# Patient Record
Sex: Male | Born: 1960 | Race: White | Hispanic: No | Smoking: Former smoker
Health system: Southern US, Community
[De-identification: ages and names within clinical notes are randomized; demographics above are authoritative.]

## PROBLEM LIST (undated history)

## (undated) DIAGNOSIS — M2540 Effusion, unspecified joint: Secondary | ICD-10-CM

## (undated) DIAGNOSIS — M2550 Pain in unspecified joint: Secondary | ICD-10-CM

## (undated) DIAGNOSIS — Z87442 Personal history of urinary calculi: Secondary | ICD-10-CM

## (undated) DIAGNOSIS — J189 Pneumonia, unspecified organism: Secondary | ICD-10-CM

## (undated) DIAGNOSIS — IMO0002 Reserved for concepts with insufficient information to code with codable children: Secondary | ICD-10-CM

## (undated) DIAGNOSIS — G8929 Other chronic pain: Secondary | ICD-10-CM

## (undated) DIAGNOSIS — R200 Anesthesia of skin: Secondary | ICD-10-CM

## (undated) DIAGNOSIS — F419 Anxiety disorder, unspecified: Secondary | ICD-10-CM

## (undated) DIAGNOSIS — H9319 Tinnitus, unspecified ear: Secondary | ICD-10-CM

## (undated) DIAGNOSIS — M549 Dorsalgia, unspecified: Secondary | ICD-10-CM

## (undated) DIAGNOSIS — T8859XA Other complications of anesthesia, initial encounter: Secondary | ICD-10-CM

## (undated) DIAGNOSIS — T4145XA Adverse effect of unspecified anesthetic, initial encounter: Secondary | ICD-10-CM

## (undated) DIAGNOSIS — M62838 Other muscle spasm: Secondary | ICD-10-CM

## (undated) DIAGNOSIS — R202 Paresthesia of skin: Secondary | ICD-10-CM

## (undated) DIAGNOSIS — Z8739 Personal history of other diseases of the musculoskeletal system and connective tissue: Secondary | ICD-10-CM

## (undated) DIAGNOSIS — I10 Essential (primary) hypertension: Secondary | ICD-10-CM

## (undated) DIAGNOSIS — M1990 Unspecified osteoarthritis, unspecified site: Secondary | ICD-10-CM

## (undated) HISTORY — PX: OTHER SURGICAL HISTORY: SHX169

## (undated) HISTORY — PX: HERNIA REPAIR: SHX51

## (undated) HISTORY — PX: LITHOTRIPSY: SUR834

---

## 2010-01-31 ENCOUNTER — Telehealth: Payer: Self-pay | Admitting: Critical Care Medicine

## 2010-01-31 ENCOUNTER — Ambulatory Visit: Payer: Self-pay | Admitting: Critical Care Medicine

## 2010-01-31 DIAGNOSIS — I10 Essential (primary) hypertension: Secondary | ICD-10-CM | POA: Insufficient documentation

## 2010-01-31 DIAGNOSIS — J189 Pneumonia, unspecified organism: Secondary | ICD-10-CM | POA: Insufficient documentation

## 2010-01-31 DIAGNOSIS — R222 Localized swelling, mass and lump, trunk: Secondary | ICD-10-CM | POA: Insufficient documentation

## 2010-02-04 ENCOUNTER — Telehealth (INDEPENDENT_AMBULATORY_CARE_PROVIDER_SITE_OTHER): Payer: Self-pay | Admitting: *Deleted

## 2010-02-05 ENCOUNTER — Ambulatory Visit
Admission: RE | Admit: 2010-02-05 | Discharge: 2010-02-05 | Payer: Self-pay | Source: Home / Self Care | Admitting: Critical Care Medicine

## 2010-02-05 ENCOUNTER — Ambulatory Visit: Payer: Self-pay | Admitting: Critical Care Medicine

## 2010-02-06 ENCOUNTER — Telehealth: Payer: Self-pay | Admitting: Critical Care Medicine

## 2010-02-07 ENCOUNTER — Telehealth: Payer: Self-pay | Admitting: Critical Care Medicine

## 2010-02-07 ENCOUNTER — Encounter: Payer: Self-pay | Admitting: Critical Care Medicine

## 2010-02-14 ENCOUNTER — Ambulatory Visit (HOSPITAL_BASED_OUTPATIENT_CLINIC_OR_DEPARTMENT_OTHER): Admission: RE | Admit: 2010-02-14 | Discharge: 2010-02-14 | Payer: Self-pay | Admitting: Critical Care Medicine

## 2010-02-14 ENCOUNTER — Ambulatory Visit: Payer: Self-pay | Admitting: Diagnostic Radiology

## 2010-02-14 ENCOUNTER — Ambulatory Visit: Payer: Self-pay | Admitting: Critical Care Medicine

## 2010-08-29 NOTE — Miscellaneous (Signed)
Summary: Cleocin Rx  Clinical Lists Changes  Medications: Changed medication from CLEOCIN 300 MG CAPS (CLINDAMYCIN HCL) Take 1 tablet by mouth three times a day to CLEOCIN 300 MG CAPS (CLINDAMYCIN HCL) Take 1 tablet by mouth three times a day - Signed Rx of CLEOCIN 300 MG CAPS (CLINDAMYCIN HCL) Take 1 tablet by mouth three times a day;  #30 x 0;  Signed;  Entered by: Storm Frisk MD;  Authorized by: Storm Frisk MD;  Method used: Electronically to [facility]*, [address], [place], [place]  [address], Ph: [phone] or [phone], Fax: [phone]    Prescriptions: CLEOCIN 300 MG CAPS (CLINDAMYCIN HCL) Take 1 tablet by mouth three times a day  #30 x 0   Entered and Authorized by:   Storm Frisk MD   Signed by:   Storm Frisk MD on 02/07/2010   Method used:   Electronically to        [facility]* (retail)       [address] [place], [place]  [address]       Ph: [phone] or [phone]       Fax: [phone]   RxID:   [id number]

## 2010-08-29 NOTE — Letter (Signed)
Summary: [employer]  [address]   [place], [place] [address]   Phone: [phone]  Fax: [phone]    Today's Date: February 14, 2010  Name of Patient: Vernon Frye  The above named patient had a medical visit today at: 9 am   Please take this into consideration when reviewing the time away from work/school.    Special Instructions:  [ x ] None  [  ] To be off the remainder of today, returning to the normal work / school schedule tomorrow.  [  ] To be off until the next scheduled appointment on ______________________.  [  ] Other ________________________________________________________________ ________________________________________________________________________   Sincerely yours,   Shan Levans, MD  Appended Document: Work Excuse I agree  pw

## 2010-08-29 NOTE — Progress Notes (Signed)
Summary: rx  Phone Note Call from Patient Call back at Home Phone [phone]   Caller: Patient Call For: Vernon Frye Reason for Call: Talk to Nurse Summary of Call: PEW running scope next week, pt coming in afterward.  Also said he would call in antibiotic.  Pt needs to be able to pick up rx by 4:30.  He lives 30 miles from pharmacy and wants to pick up on his way home.  [facility]) Initial call taken by: Vernon Frye,  January 31, 2010 2:47 PM  Follow-up for Phone Call        Please advise what rx you want pt have have, thanks Vernon Frye  January 31, 2010 3:14 PM   Additional Follow-up for Phone Call Additional follow up Details #1::        call in cleocin  300mg  by mouth three times a day #30  Rx sent, pt informed. Vernon Frye CMA  February 01, 2010 9:20 AM  Additional Follow-up by: Storm Frisk MD,  January 31, 2010 9:13 PM    New/Updated Medications: CLEOCIN 300 MG CAPS (CLINDAMYCIN HCL) Take 1 tablet by mouth three times a day Prescriptions: CLEOCIN 300 MG CAPS (CLINDAMYCIN HCL) Take 1 tablet by mouth three times a day  #30 x 0   Entered by:   Vernon Frye CMA   Authorized by:   Storm Frisk MD   Signed by:   Vernon Frye CMA on 02/01/2010   Method used:   Electronically to        [facility]* (retail)       [address] [place], [place]  [address]       Ph: [phone] or [phone]       Fax: [phone]   RxID:   [id number]

## 2010-08-29 NOTE — Progress Notes (Signed)
Summary: Needs appt one week [place]  Phone Note Outgoing Call   Reason for Call: Confirm/change Appt Summary of Call: pls call this pt and get him an appt with CXR first next week in [place] Initial call taken by: Storm Frisk MD,  February 07, 2010 9:04 AM  Follow-up for Phone Call        Called, spoke with pt.  Pt informed he needs to come in next wk for cxr and to see PW.  OV scheduled for July 21 at 9:00 in [place], pt informed to arrive at 845am to check in, then go down for CXR and come back up for OV with PW.  Pt verbalized understanding.  Gweneth Dimitri RN  February 07, 2010 9:19 AM

## 2010-08-29 NOTE — Progress Notes (Signed)
Summary: nos appt  Phone Note Call from Patient   Caller: [email or web address]  Call For: Cherl Gorney Summary of Call: LMTCB x2 to rsc nos from 7/12. Initial call taken by: Darletta Moll,  February 06, 2010 3:50 PM

## 2010-08-29 NOTE — Assessment & Plan Note (Signed)
Summary: Pulmonary  OV   Copy to:  Dr. Candie Echevaria Primary Provider/Referring Provider:  Dr. Candie Echevaria  CC:  Follow up with CXR.  Pt states breathing and cough have improved.  States he still has a very slight cough-occ prod with a small amount of clear mucus.  Pt also states he is also having sinus drainage.[name] Kitchen  History of Present Illness: Pulmonary OV   This is a 50 year old male with necrotizing LUL lung abscess, poor dentition. This pt has been ill for one month.  He had what he thought was a  cold let it go and then worse. The  saw pcp moore who  dx Pna.  The pt was initially rxwith rocephin  injections, zpak.  Later Cefzil added.  CXR persisting LUL infiltrate.  The pt Had severe cough paroxysms.  The pt with hx poor dentition.  Pt smokes cigars.  There was blood streak mucus.    CT chest then done 01/25/10 and revealed cavitary LUL infilt/mass and two liver lesions ,hypodense. Pt saw GI MD who said see pulm first. The pt now:  still coughs and feels like sl amount of phlegm  There is now no chest pain,  no real fever.  notes night sweats,  No TB exposure.     February 14, 2010 9:21 AM Pt notes improvement, less cough, less congestion, No sinus pressure,  no f/c/s. No chest pain. No excess secretions.  Pt had FOB that revealed no endobronchial lesions.  No purulence.  Bx neg.  C/S data unrevealing.   CXR today improved.  Preventive Screening-Counseling & Management  Alcohol-Tobacco     Alcohol drinks/day: 2     Alcohol type: beer     Smoking Status: quit < 6 months     Year Quit: 2007     Cigars/week: 1 cigar daily for past year, now quit 12/2009  Current Medications (verified): 1)  Toprol Xl 50 Mg Xr24h-Tab (Metoprolol Succinate) .... Take 1 Tablet By Mouth Once A Day 2)  Lisinopril 30 Mg Tabs (Lisinopril) .... Take 1 Tablet By Mouth Once A Day 3)  Xanax 0.5 Mg Tabs (Alprazolam) .... One To Two Times Daily 4)  Cleocin 300 Mg Caps (Clindamycin Hcl) .... Take 1 Tablet By Mouth Three  Times A Day  Allergies (verified): No Known Drug Allergies  Past History:  Past medical, surgical, family and social histories (including risk factors) reviewed, and no changes noted (except as noted below).  Past Medical History: Reviewed history from 01/31/2010 and no changes required. HYPERTENSION (ICD-401.9)  Past Surgical History: Reviewed history from 01/31/2010 and no changes required. kidney stones removed 2002  lithotripsy  Family History: Reviewed history from 01/31/2010 and no changes required. mother-hx of asthma  Social History: Reviewed history from 01/31/2010 and no changes required. Patient states former smoker.  Quit smoking cig in 2007: < 1ppd x 12 years.  Quit smoking cigars in June 2011: 1 cigar/day x 1 yr. 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  The patient denies shortness of breath with activity, shortness of breath at rest, productive cough, coughing up blood, chest pain, irregular heartbeats, acid heartburn, indigestion, loss of appetite, weight change, abdominal pain, difficulty swallowing, sore throat, tooth/dental problems, headaches, nasal congestion/difficulty breathing through nose, sneezing, itching, ear ache, anxiety, depression, hand/feet swelling, joint stiffness or pain, rash, change in color of mucus,  and fever.    Vital Signs:  Patient profile:   50 year old male Height:      75 inches Weight:      241 pounds BMI:     30.23 O2 Sat:      97 % on Room air Temp:     98.5 degrees F oral Pulse rate:   88 / minute BP sitting:   132 / 92  (left arm) Cuff size:   regular  Vitals Entered By: Gweneth Dimitri RN (February 14, 2010 9:17 AM)  O2 Flow:  Room air CC: Follow up with CXR.  Pt states breathing and cough have improved.  States he still has a very slight cough-occ prod with a small amount of clear mucus.  Pt also states  he is also having sinus drainage. Comments Medications reviewed with patient Daytime contact number verified with patient. Gweneth Dimitri RN  February 14, 2010 9:18 AM    Physical Exam  Additional Exam:  Gen: Pleasant, well-nourished, in no distress,  normal affect ENT: No lesions,  mouth clear,  oropharynx clear, no postnasal drip, moderate periodontal disease Neck: No JVD, no TMG, no carotid bruits Lungs: No use of accessory muscles, no dullness to percussion, clear without rales or rhonchi Cardiovascular: RRR, heart sounds normal, no murmur or gallops, no peripheral edema Abdomen: soft and NT, no HSM,  BS normal Musculoskeletal: No deformities, no cyanosis or clubbing Neuro: alert, non focal Skin: Warm, no lesions or rashes    CXR  Procedure date:  02/14/2010  Findings:      IMPRESSION: No pulmonary edema.  No pleural effusion.  No pneumothorax.  Stable infiltrative process in left upper lobe.  Impression & Recommendations:  Problem # 1:  PNEUMONIA (ICD-486) Assessment Improved LUL necrotizing pneumonia wiht cavity formation. All c/s neg. Pt improved on cleocin.  Poor dentition noted with periodontal dz plan f/u in 6weeks with cxr complete 20days of 300mg  three times a day cleocin d/c cigars liver lesions suspect are cysts.  doubt need further eval minimize alcohol use  His updated medication list for this problem includes:    Cleocin 300 Mg Caps (Clindamycin hcl) .[name] Kitchen... Take 1 tablet by mouth three times a day  Orders: Est. Patient Level III ([address])  Complete Medication List: 1)  Toprol Xl 50 Mg Xr24h-tab (Metoprolol succinate) .... Take 1 tablet by mouth once a day 2)  Lisinopril 30 Mg Tabs (Lisinopril) .... Take 1 tablet by mouth once a day 3)  Xanax 0.5 Mg Tabs (Alprazolam) .... One to two times daily 4)  Cleocin 300 Mg Caps (Clindamycin hcl) .... Take 1 tablet by mouth three times a day  Patient Instructions: 1)  No change in medications 2)  Return in       4    months  with Chest Xray  Appended Document: Pulmonary  OV fax Candie Echevaria

## 2010-08-29 NOTE — Assessment & Plan Note (Signed)
Summary: Pulmonary Consultation   Copy to:  Dr. Candie Echevaria Primary Provider/Referring Provider:  Dr. Candie Echevaria  CC:  Pulmonary Consult for pulmonary nodule and recent pna..  History of Present Illness: Pulmonary Consultation   This is a 50 year old male who presents with an abnormal radiology finding.  The patient complains of shortness of breath, chest tightness, chest pain worse with breathing and coughing, wheezing, cough, mucous production, nocturnal awakening, exercise induced symptoms, and congestion, but denies history of diagnosed COPD.  The abnormality is described as pulmonary infiltrate and mass in LUL with cavitation.  The mass is described as left upper lobe.  There is no assoc lymphadenopathy.  This pt has been ill for one month.  He had what he thought was a  cold let it go and then worse. The  saw pcp moore who  dx Pna.  The pt was initially rxwith rocephin  injections, zpak.  Later Cefzil added.  CXR persisting LUL infiltrate.  The pt Had severe cough paroxysms.  The pt with hx poor dentition.  Pt smokes cigars.  There was blood streak mucus.    CT chest then done 01/25/10 and revealed cavitary LUL infilt/mass and two liver lesions ,hypodense. Pt saw GI MD who said see pulm first. The pt now:  still coughs and feels like sl amount of phlegm  There is now no chest pain,  no real fever.  notes night sweats,  No TB exposure.       Preventive Screening-Counseling & Management  Alcohol-Tobacco     Alcohol drinks/day: 2     Alcohol type: beer     Smoking Status: quit < 6 months     Year Quit: 2007     Cigars/week: 1 cigar daily for past year, now quit 12/2009  Current Medications (verified): 1)  Toprol Xl 50 Mg Xr24h-Tab (Metoprolol Succinate) .... Take 1 Tablet By Mouth Once A Day 2)  Lisinopril 30 Mg Tabs (Lisinopril) .... Take 1 Tablet By Mouth Once A Day 3)  Xanax 0.5 Mg Tabs (Alprazolam) .... One To Two Times Daily  Allergies (verified): No Known Drug  Allergies  Past History:  Past medical, surgical, family and social histories (including risk factors) reviewed, and no changes noted (except as noted below).  Past Medical History: HYPERTENSION (ICD-401.9)  Past Surgical History: kidney stones removed 2002  lithotripsy  Family History: Reviewed history and no changes required. mother-hx of asthma  Social History: Reviewed history and no changes required. Patient states former smoker.  Quit smoking cig in 2007: < 1ppd x 12 years.  Quit smoking cigars in June 2011: 1 cigar/day x 1 yr. 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  The patient denies shortness of breath with activity, shortness of breath at rest, non-productive cough, chest pain, irregular heartbeats, acid heartburn, abdominal pain, difficulty swallowing, sore throat, tooth/dental problems, headaches, nasal congestion/difficulty breathing through nose, sneezing, itching, ear ache, hand/feet swelling, joint stiffness or pain, and rash.        See HPI for Pulmonary, Cardiac, General, and ENT review of systems.  Vital Signs:  Patient profile:   49 year old male Height:  75 inches Weight:      238 pounds BMI:     29.86 O2 Sat:      96 % on Room air Temp:     98.0 degrees F oral Pulse rate:   97 / minute BP sitting:   114 / 86  (left arm) Cuff size:   large  Vitals Entered By: Gweneth Dimitri RN (January 31, 2010 9:03 AM)  O2 Flow:  Room air CC: Pulmonary Consult for pulmonary nodule and recent pna. Comments Medications reviewed with patient Daytime contact number verified with patient. Gweneth Dimitri RN  January 31, 2010 9:03 AM    Physical  Exam  Additional Exam:  Gen: Pleasant, well-nourished, in no distress,  normal affect ENT: No lesions,  mouth clear,  oropharynx clear, no postnasal drip, moderate periodontal disease Neck: No JVD, no TMG, no carotid bruits Lungs: No use of accessory muscles, no dullness to percussion, clear without rales or rhonchi Cardiovascular: RRR, heart sounds normal, no murmur or gallops, no peripheral edema Abdomen: soft and NT, no HSM,  BS normal Musculoskeletal: No deformities, no cyanosis or clubbing Neuro: alert, non focal Skin: Warm, no lesions or rashes    CT of Chest  Procedure date:  01/25/2010  Findings:      cavitary left upper lobe mass/infection, 2 liver lesions, not a cyst,  1 cm pleural based RLL nodule  Impression & Recommendations:  Problem # 1:  SWELLING, MASS, OR LUMP IN CHEST (ICD-786.6) Assessment Deteriorated LUL infiltrate/mass with cavitation, no hilar or mediastinal LAN ? abscess ?CA  poor dentition makes one suspect aspiration.  Moderate ETOH use also a risk factor  plan FOB scheduled eventually needs a PET scan start cleocin 300mg  three times a day   Orders: New Patient Level V ([address])  Medications Added to Medication List This Visit: 1)  Toprol Xl 50 Mg Xr24h-tab (Metoprolol succinate) .... Take 1 tablet by mouth once a day 2)  Lisinopril 30 Mg Tabs (Lisinopril) .... Take 1 tablet by mouth once a day 3)  Xanax 0.5 Mg Tabs (Alprazolam) .... One to two times daily  Complete Medication List: 1)  Toprol Xl 50 Mg Xr24h-tab (Metoprolol succinate) .... Take 1 tablet by mouth once a day 2)  Lisinopril 30 Mg Tabs (Lisinopril) .... Take 1 tablet by mouth once a day 3)  Xanax 0.5 Mg Tabs (Alprazolam) .... One to two times daily  Patient Instructions: 1)  A Bronchoscopy will be scheduled for next week, call me for day you wish this scheduled. 2)  I will obtain and review your CT scan  Appended Document: Pulmonary Consultation fax Candie Echevaria

## 2010-08-29 NOTE — Progress Notes (Signed)
Summary: Bronch @ [facility] on Tues., 02/05/2010 @ 1pm  Phone Note Outgoing Call Call back at Home Phone [phone]   Reason for Call: Confirm/change Appt Summary of Call: Pls contact the pt to determine a date/time for the bronchoscopy to be scheduled this week at [facility]  Best times for me are :  7/12 or 7/13 at 1pm,    7/14 at 130pm  or 730am  I need fluoro.  Possible TB risk.  PW Initial call taken by: Storm Frisk MD,  February 04, 2010 11:24 AM  Follow-up for Phone Call        pt called, thought bronch would be done here in office.  Nothing has been set up, Lawson Fiscal advised and taking care of.  Pt aware Lawson Fiscal will contact him re: appt.  [phone] Follow-up by: Eugene Gavia,  February 04, 2010 11:46 AM  Additional Follow-up for Phone Call Additional follow up Details #1::        The bronchoscopy is sch for Tues., 02/05/2010 @ 1pm. Patient knows to arrive at 11:45am in admitting at [facility]. NPO after midnight and someone will need to be with him to drive him home. The patient had verbal understanding of all instructions. I will forward msg to Dr. Delford Field so he is aware.  Bronch @ [facility] @ 1pm on 02/05/2010. Additional Follow-up by: Michel Bickers CMA,  February 04, 2010 11:52 AM    Additional Follow-up for Phone Call Additional follow up Details #2::    noted and thanks Follow-up by: Storm Frisk MD,  February 04, 2010 12:05 PM

## 2010-10-13 LAB — LEGIONELLA PROFILE(CULTURE+DFA/SMEAR): Legionella Antigen (DFA): NEGATIVE

## 2010-10-13 LAB — FUNGUS CULTURE W SMEAR: Fungal Smear: NONE SEEN

## 2010-10-13 LAB — CULTURE, RESPIRATORY W GRAM STAIN: Culture: NO GROWTH

## 2010-10-13 LAB — PNEUMOCYSTIS JIROVECI SMEAR BY DFA: Pneumocystis jiroveci Ag: NOT DETECTED

## 2010-10-13 LAB — AFB CULTURE WITH SMEAR (NOT AT ARMC): Acid Fast Smear: NONE SEEN

## 2014-01-10 NOTE — H&P (Signed)
  Vernon Frye is an 53 y.o. male.    Chief Complaint: left shoulder pain   HPI: Pt is a 53 y.o. male complaining of left shoulder pain for multiple months. Pain had continually increased since the beginning. X-rays in the clinic rotator cuff tear in left shoulder. Pt has tried various conservative treatments which have failed to alleviate their symptoms, including exercises and injections. Various options are discussed with the patient. Risks, benefits and expectations were discussed with the patient. Patient understand the risks, benefits and expectations and wishes to proceed with surgery.   PCP:  No primary provider on file.  D/C Plans:  Home   PMH: No past medical history on file.  PSH: No past surgical history on file.  Social History:  has no tobacco, alcohol, and drug history on file.  Allergies:  Allergies not on file  Medications: No current facility-administered medications for this encounter.   No current outpatient prescriptions on file.    No results found for this or any previous visit (from the past 48 hour(s)). No results found.  ROS: Pain with rom of the left upper extremity  Physical Exam:  Alert and oriented 53 y.o. male in no acute distress Cranial nerves 2-12 intact Cervical spine: full rom with no tenderness, nv intact distally Chest: active breath sounds bilaterally, no wheeze rhonchi or rales Heart: regular rate and rhythm, no murmur Abd: non tender non distended with active bowel sounds Hip is stable with rom  Left shoulder with moderate weakness with external rotation Guarded rom due to pain nv intact distally No rashes or edema  Assessment/Plan Assessment: left shoulder rotator cuff tear  Plan: Patient will undergo a left shoulder rotator cuff repair by Dr. Veverly Fells at [facility]. Risks benefits and expectations were discussed with the patient. Patient understand risks, benefits and expectations and wishes to proceed.

## 2014-01-12 ENCOUNTER — Encounter (HOSPITAL_COMMUNITY): Payer: Self-pay | Admitting: Pharmacy Technician

## 2014-01-13 NOTE — Progress Notes (Signed)
Several unsuccessful attempts were made to contact pt; lvm on #[id number] with pre-op instructions according to checklist.

## 2014-01-15 MED ORDER — CEFAZOLIN SODIUM-DEXTROSE 2-3 GM-% IV SOLR
2.0000 g | INTRAVENOUS | Status: AC
  Administered 2014-01-16: 2 g via INTRAVENOUS
  Filled 2014-01-15: qty 50

## 2014-01-16 ENCOUNTER — Observation Stay (HOSPITAL_COMMUNITY)
Admission: RE | Admit: 2014-01-16 | Discharge: 2014-01-17 | Disposition: A | Discharge status: Home / Self Care | Payer: Worker's Compensation | Source: Ambulatory Visit | Attending: Orthopedic Surgery | Admitting: Orthopedic Surgery

## 2014-01-16 ENCOUNTER — Encounter (HOSPITAL_COMMUNITY): Payer: Self-pay | Admitting: *Deleted

## 2014-01-16 ENCOUNTER — Encounter (HOSPITAL_COMMUNITY): Payer: Worker's Compensation | Admitting: Certified Registered Nurse Anesthetist

## 2014-01-16 ENCOUNTER — Ambulatory Visit (HOSPITAL_COMMUNITY): Payer: Worker's Compensation

## 2014-01-16 ENCOUNTER — Ambulatory Visit (HOSPITAL_COMMUNITY): Payer: Worker's Compensation | Admitting: Certified Registered Nurse Anesthetist

## 2014-01-16 ENCOUNTER — Encounter (HOSPITAL_COMMUNITY)
Admission: RE | Disposition: A | Discharge status: Home / Self Care | Payer: Worker's Compensation | Source: Ambulatory Visit | Attending: Orthopedic Surgery

## 2014-01-16 DIAGNOSIS — X58XXXA Exposure to other specified factors, initial encounter: Secondary | ICD-10-CM | POA: Insufficient documentation

## 2014-01-16 DIAGNOSIS — M19019 Primary osteoarthritis, unspecified shoulder: Secondary | ICD-10-CM | POA: Insufficient documentation

## 2014-01-16 DIAGNOSIS — M9420 Chondromalacia, unspecified site: Secondary | ICD-10-CM | POA: Insufficient documentation

## 2014-01-16 DIAGNOSIS — Z5333 Arthroscopic surgical procedure converted to open procedure: Secondary | ICD-10-CM | POA: Insufficient documentation

## 2014-01-16 DIAGNOSIS — I10 Essential (primary) hypertension: Secondary | ICD-10-CM | POA: Insufficient documentation

## 2014-01-16 DIAGNOSIS — S43429A Sprain of unspecified rotator cuff capsule, initial encounter: Principal | ICD-10-CM | POA: Insufficient documentation

## 2014-01-16 DIAGNOSIS — M75100 Unspecified rotator cuff tear or rupture of unspecified shoulder, not specified as traumatic: Secondary | ICD-10-CM | POA: Diagnosis present

## 2014-01-16 DIAGNOSIS — S43439A Superior glenoid labrum lesion of unspecified shoulder, initial encounter: Secondary | ICD-10-CM | POA: Insufficient documentation

## 2014-01-16 DIAGNOSIS — Z87891 Personal history of nicotine dependence: Secondary | ICD-10-CM | POA: Insufficient documentation

## 2014-01-16 HISTORY — DX: Personal history of urinary calculi: Z87.442

## 2014-01-16 HISTORY — DX: Effusion, unspecified joint: M25.40

## 2014-01-16 HISTORY — DX: Dorsalgia, unspecified: M54.9

## 2014-01-16 HISTORY — DX: Essential (primary) hypertension: I10

## 2014-01-16 HISTORY — DX: Unspecified osteoarthritis, unspecified site: M19.90

## 2014-01-16 HISTORY — DX: Other muscle spasm: M62.838

## 2014-01-16 HISTORY — DX: Pain in unspecified joint: M25.50

## 2014-01-16 HISTORY — DX: Anxiety disorder, unspecified: F41.9

## 2014-01-16 HISTORY — DX: Other chronic pain: G89.29

## 2014-01-16 HISTORY — PX: SHOULDER ARTHROSCOPY WITH SUBACROMIAL DECOMPRESSION AND OPEN ROTATOR C: SHX5688

## 2014-01-16 LAB — CBC
HCT: 42.1 % (ref 39.0–52.0)
HEMOGLOBIN: 14.6 g/dL (ref 13.0–17.0)
MCH: 31.7 pg (ref 26.0–34.0)
MCHC: 34.7 g/dL (ref 30.0–36.0)
MCV: 91.3 fL (ref 78.0–100.0)
Platelets: 274 10*3/uL (ref 150–400)
RBC: 4.61 MIL/uL (ref 4.22–5.81)
RDW: 12.5 % (ref 11.5–15.5)
WBC: 7.9 10*3/uL (ref 4.0–10.5)

## 2014-01-16 LAB — BASIC METABOLIC PANEL
BUN: 16 mg/dL (ref 6–23)
CALCIUM: 9.5 mg/dL (ref 8.4–10.5)
CO2: 26 mEq/L (ref 19–32)
Chloride: 101 mEq/L (ref 96–112)
Creatinine, Ser: 0.87 mg/dL (ref 0.50–1.35)
GFR calc non Af Amer: >90 mL/min (ref >90)
GLUCOSE: 119 mg/dL — AB (ref 70–99)
POTASSIUM: 4.2 meq/L (ref 3.7–5.3)
SODIUM: 139 meq/L (ref 137–147)

## 2014-01-16 SURGERY — SHOULDER ARTHROSCOPY WITH SUBACROMIAL DECOMPRESSION AND OPEN ROTATOR CUFF REPAIR, OPEN BICEPS TENDON REPAIR
Anesthesia: Regional | Site: Shoulder | Laterality: Left

## 2014-01-16 MED ORDER — BUPIVACAINE-EPINEPHRINE (PF) 0.25% -1:200000 IJ SOLN
INTRAMUSCULAR | Status: AC
  Filled 2014-01-16: qty 30

## 2014-01-16 MED ORDER — OXYCODONE-ACETAMINOPHEN 5-325 MG PO TABS
1.0000 | ORAL_TABLET | Freq: Three times a day (TID) | ORAL | Status: DC | PRN
  Administered 2014-01-17: 1 via ORAL
  Filled 2014-01-16: qty 1

## 2014-01-16 MED ORDER — MIDAZOLAM HCL 2 MG/2ML IJ SOLN
INTRAMUSCULAR | Status: AC
  Filled 2014-01-16: qty 2

## 2014-01-16 MED ORDER — FENTANYL CITRATE 0.05 MG/ML IJ SOLN
INTRAMUSCULAR | Status: AC
  Filled 2014-01-16: qty 5

## 2014-01-16 MED ORDER — PHENYLEPHRINE 40 MCG/ML (10ML) SYRINGE FOR IV PUSH (FOR BLOOD PRESSURE SUPPORT)
PREFILLED_SYRINGE | INTRAVENOUS | Status: AC
  Filled 2014-01-16: qty 10

## 2014-01-16 MED ORDER — PHENOL 1.4 % MT LIQD: 1.0000 | OROMUCOSAL | Status: DC | PRN

## 2014-01-16 MED ORDER — LISINOPRIL 10 MG PO TABS
10.0000 mg | ORAL_TABLET | Freq: Every day | ORAL | Status: DC
  Administered 2014-01-17: 10 mg via ORAL
  Filled 2014-01-16: qty 1

## 2014-01-16 MED ORDER — CHLORHEXIDINE GLUCONATE 4 % EX LIQD
60.0000 mL | Freq: Once | CUTANEOUS | Status: DC
Start: 2014-01-16 — End: 2014-01-16
  Filled 2014-01-16: qty 60

## 2014-01-16 MED ORDER — METAXALONE 800 MG PO TABS: 800.0000 mg | ORAL_TABLET | Freq: Four times a day (QID) | ORAL | Status: DC | PRN

## 2014-01-16 MED ORDER — ONDANSETRON HCL 4 MG PO TABS: 4.0000 mg | ORAL_TABLET | Freq: Four times a day (QID) | ORAL | Status: DC | PRN

## 2014-01-16 MED ORDER — OXYCODONE-ACETAMINOPHEN 10-325 MG PO TABS: 1.0000 | ORAL_TABLET | Freq: Three times a day (TID) | ORAL | Status: DC | PRN

## 2014-01-16 MED ORDER — KETOROLAC TROMETHAMINE 30 MG/ML IJ SOLN
INTRAMUSCULAR | Status: AC
  Filled 2014-01-16: qty 1

## 2014-01-16 MED ORDER — STERILE WATER FOR IRRIGATION IR SOLN
Status: DC | PRN
  Administered 2014-01-16: 30 mL

## 2014-01-16 MED ORDER — NAPROXEN 500 MG PO TABS
500.0000 mg | ORAL_TABLET | Freq: Two times a day (BID) | ORAL | Status: DC | PRN
  Filled 2014-01-16: qty 1

## 2014-01-16 MED ORDER — HYDROMORPHONE HCL PF 1 MG/ML IJ SOLN
1.0000 mg | INTRAMUSCULAR | Status: DC | PRN
  Administered 2014-01-16: 2 mg via INTRAVENOUS
  Administered 2014-01-16: 1 mg via INTRAVENOUS
  Administered 2014-01-16 – 2014-01-17 (×4): 2 mg via INTRAVENOUS
  Filled 2014-01-16: qty 1
  Filled 2014-01-16 (×5): qty 2

## 2014-01-16 MED ORDER — OXYCODONE HCL 5 MG PO TABS
5.0000 mg | ORAL_TABLET | Freq: Three times a day (TID) | ORAL | Status: DC | PRN
  Administered 2014-01-17: 5 mg via ORAL
  Filled 2014-01-16: qty 1

## 2014-01-16 MED ORDER — LACTATED RINGERS IV SOLN
INTRAVENOUS | Status: DC | PRN
  Administered 2014-01-16 (×2): via INTRAVENOUS

## 2014-01-16 MED ORDER — ARTIFICIAL TEARS OP OINT
TOPICAL_OINTMENT | OPHTHALMIC | Status: DC | PRN
  Administered 2014-01-16: 1 via OPHTHALMIC

## 2014-01-16 MED ORDER — HYDROMORPHONE HCL 2 MG PO TABS
2.0000 mg | ORAL_TABLET | ORAL | Status: DC | PRN
  Administered 2014-01-16: 4 mg via ORAL
  Administered 2014-01-16: 2 mg via ORAL
  Administered 2014-01-17 (×3): 4 mg via ORAL
  Filled 2014-01-16 (×2): qty 2
  Filled 2014-01-16: qty 1
  Filled 2014-01-16 (×2): qty 2

## 2014-01-16 MED ORDER — HYDROMORPHONE HCL 2 MG PO TABS: 2.0000 mg | ORAL_TABLET | ORAL | Status: DC | PRN

## 2014-01-16 MED ORDER — AMLODIPINE BESYLATE 5 MG PO TABS
5.0000 mg | ORAL_TABLET | Freq: Every day | ORAL | Status: DC
  Administered 2014-01-17: 5 mg via ORAL
  Filled 2014-01-16: qty 1

## 2014-01-16 MED ORDER — ONDANSETRON HCL 4 MG/2ML IJ SOLN: 4.0000 mg | Freq: Once | INTRAMUSCULAR | Status: DC | PRN

## 2014-01-16 MED ORDER — LIDOCAINE HCL (CARDIAC) 20 MG/ML IV SOLN
INTRAVENOUS | Status: AC
  Filled 2014-01-16: qty 5

## 2014-01-16 MED ORDER — LISINOPRIL-HYDROCHLOROTHIAZIDE 20-12.5 MG PO TABS: 1.0000 | ORAL_TABLET | Freq: Two times a day (BID) | ORAL | Status: DC

## 2014-01-16 MED ORDER — FENTANYL CITRATE 0.05 MG/ML IJ SOLN
INTRAMUSCULAR | Status: AC
  Administered 2014-01-16: 100 ug
  Filled 2014-01-16: qty 2

## 2014-01-16 MED ORDER — BUPIVACAINE-EPINEPHRINE 0.25% -1:200000 IJ SOLN
INTRAMUSCULAR | Status: DC | PRN
  Administered 2014-01-16: 30 mL

## 2014-01-16 MED ORDER — GLYCOPYRROLATE 0.2 MG/ML IJ SOLN
INTRAMUSCULAR | Status: AC
  Filled 2014-01-16: qty 6

## 2014-01-16 MED ORDER — PHENYLEPHRINE HCL 10 MG/ML IJ SOLN
INTRAMUSCULAR | Status: DC | PRN
  Administered 2014-01-16: 80 ug via INTRAVENOUS

## 2014-01-16 MED ORDER — NEOSTIGMINE METHYLSULFATE 10 MG/10ML IV SOLN
INTRAVENOUS | Status: DC | PRN
  Administered 2014-01-16: 4 mg via INTRAVENOUS

## 2014-01-16 MED ORDER — METOCLOPRAMIDE HCL 5 MG/ML IJ SOLN: 5.0000 mg | Freq: Three times a day (TID) | INTRAMUSCULAR | Status: DC | PRN

## 2014-01-16 MED ORDER — LIDOCAINE HCL (CARDIAC) 20 MG/ML IV SOLN
INTRAVENOUS | Status: DC | PRN
  Administered 2014-01-16: 40 mg via INTRAVENOUS

## 2014-01-16 MED ORDER — ONDANSETRON HCL 4 MG/2ML IJ SOLN: 4.0000 mg | Freq: Four times a day (QID) | INTRAMUSCULAR | Status: DC | PRN

## 2014-01-16 MED ORDER — PROPOFOL 10 MG/ML IV BOLUS
INTRAVENOUS | Status: DC | PRN
  Administered 2014-01-16: 180 mg via INTRAVENOUS

## 2014-01-16 MED ORDER — LACTATED RINGERS IV SOLN
INTRAVENOUS | Status: DC
  Administered 2014-01-16: 09:00:00 via INTRAVENOUS

## 2014-01-16 MED ORDER — ALPRAZOLAM 0.5 MG PO TABS
1.0000 mg | ORAL_TABLET | Freq: Three times a day (TID) | ORAL | Status: DC | PRN
  Administered 2014-01-17 (×2): 1 mg via ORAL
  Filled 2014-01-16 (×2): qty 2

## 2014-01-16 MED ORDER — MIDAZOLAM HCL 2 MG/2ML IJ SOLN
INTRAMUSCULAR | Status: AC
  Administered 2014-01-16: 2 mg
  Filled 2014-01-16: qty 2

## 2014-01-16 MED ORDER — KETOROLAC TROMETHAMINE 30 MG/ML IJ SOLN
INTRAMUSCULAR | Status: DC | PRN
  Administered 2014-01-16: 30 mg via INTRAVENOUS

## 2014-01-16 MED ORDER — ROCURONIUM BROMIDE 50 MG/5ML IV SOLN
INTRAVENOUS | Status: AC
  Filled 2014-01-16: qty 1

## 2014-01-16 MED ORDER — GLYCOPYRROLATE 0.2 MG/ML IJ SOLN
INTRAMUSCULAR | Status: DC | PRN
  Administered 2014-01-16: .8 mg via INTRAVENOUS
  Administered 2014-01-16: 0.2 mg via INTRAVENOUS

## 2014-01-16 MED ORDER — METOPROLOL SUCCINATE ER 50 MG PO TB24
50.0000 mg | ORAL_TABLET | Freq: Every day | ORAL | Status: DC
  Administered 2014-01-17: 50 mg via ORAL
  Filled 2014-01-16: qty 1

## 2014-01-16 MED ORDER — MENTHOL 3 MG MT LOZG: 1.0000 | LOZENGE | OROMUCOSAL | Status: DC | PRN

## 2014-01-16 MED ORDER — EPHEDRINE SULFATE 50 MG/ML IJ SOLN
INTRAMUSCULAR | Status: DC | PRN
  Administered 2014-01-16 (×5): 10 mg via INTRAVENOUS

## 2014-01-16 MED ORDER — METOCLOPRAMIDE HCL 5 MG PO TABS
5.0000 mg | ORAL_TABLET | Freq: Three times a day (TID) | ORAL | Status: DC | PRN
  Filled 2014-01-16: qty 2

## 2014-01-16 MED ORDER — ONDANSETRON HCL 4 MG/2ML IJ SOLN
INTRAMUSCULAR | Status: DC | PRN
  Administered 2014-01-16: 4 mg via INTRAVENOUS

## 2014-01-16 MED ORDER — SODIUM CHLORIDE 0.9 % IR SOLN
Status: DC | PRN
  Administered 2014-01-16: 1

## 2014-01-16 MED ORDER — DEXAMETHASONE SODIUM PHOSPHATE 10 MG/ML IJ SOLN
INTRAMUSCULAR | Status: DC | PRN
  Administered 2014-01-16: 4 mg via INTRAVENOUS

## 2014-01-16 MED ORDER — FENTANYL CITRATE 0.05 MG/ML IJ SOLN
INTRAMUSCULAR | Status: DC | PRN
  Administered 2014-01-16: 150 ug via INTRAVENOUS

## 2014-01-16 MED ORDER — ACETAMINOPHEN 325 MG PO TABS: 650.0000 mg | ORAL_TABLET | Freq: Four times a day (QID) | ORAL | Status: DC | PRN

## 2014-01-16 MED ORDER — DEXTROSE 5 % IV SOLN
10.0000 mg | INTRAVENOUS | Status: DC | PRN
  Administered 2014-01-16: 40 ug/min via INTRAVENOUS

## 2014-01-16 MED ORDER — CARISOPRODOL 350 MG PO TABS
350.0000 mg | ORAL_TABLET | Freq: Three times a day (TID) | ORAL | Status: DC | PRN
  Administered 2014-01-16: 350 mg via ORAL
  Filled 2014-01-16: qty 1

## 2014-01-16 MED ORDER — ACETAMINOPHEN 650 MG RE SUPP: 650.0000 mg | Freq: Four times a day (QID) | RECTAL | Status: DC | PRN

## 2014-01-16 MED ORDER — HYDROCHLOROTHIAZIDE 12.5 MG PO CAPS
12.5000 mg | ORAL_CAPSULE | Freq: Every day | ORAL | Status: DC
  Administered 2014-01-17: 12.5 mg via ORAL
  Filled 2014-01-16: qty 1

## 2014-01-16 MED ORDER — HYDROMORPHONE HCL PF 1 MG/ML IJ SOLN
0.2500 mg | INTRAMUSCULAR | Status: DC | PRN
Start: 2014-01-16 — End: 2014-01-16

## 2014-01-16 MED ORDER — ONDANSETRON HCL 4 MG/2ML IJ SOLN
INTRAMUSCULAR | Status: AC
  Filled 2014-01-16: qty 2

## 2014-01-16 MED ORDER — ROCURONIUM BROMIDE 100 MG/10ML IV SOLN
INTRAVENOUS | Status: DC | PRN
  Administered 2014-01-16: 50 mg via INTRAVENOUS

## 2014-01-16 MED ORDER — SODIUM CHLORIDE 0.9 % IR SOLN
Status: DC | PRN
  Administered 2014-01-16 (×3): 3000 mL

## 2014-01-16 SURGICAL SUPPLY — 71 items
ANCH SUT 2 1.3X1 LD 1 STRN (Anchor) ×1 IMPLANT
ANCHOR ALL-SUT FLEX 1.3 Y-KNOT (Anchor) ×2 IMPLANT
BIT DRILL 1.3M DISPOSABLE (BIT) ×2 IMPLANT
BLADE LONG MED 31MMX9MM (MISCELLANEOUS) ×1
BLADE LONG MED 31X9 (MISCELLANEOUS) ×1 IMPLANT
BLADE SURG 11 STRL SS (BLADE) ×3 IMPLANT
BLADE SURG 15 STRL LF DISP TIS (BLADE) IMPLANT
BLADE SURG 15 STRL SS (BLADE) ×6
BUR OVAL 4.0 (BURR) ×2 IMPLANT
CLOSURE WOUND 1/2 X4 (GAUZE/BANDAGES/DRESSINGS) ×2
COVER SURGICAL LIGHT HANDLE (MISCELLANEOUS) ×3 IMPLANT
DRAPE INCISE IOBAN 66X45 STRL (DRAPES) ×3 IMPLANT
DRAPE STERI 35X30 U-POUCH (DRAPES) ×3 IMPLANT
DRAPE U-SHAPE 47X51 STRL (DRAPES) ×3 IMPLANT
DRSG EMULSION OIL 3X3 NADH (GAUZE/BANDAGES/DRESSINGS) ×4 IMPLANT
DRSG PAD ABDOMINAL 8X10 ST (GAUZE/BANDAGES/DRESSINGS) ×2 IMPLANT
DURAPREP 26ML APPLICATOR (WOUND CARE) ×3 IMPLANT
ELECT NDL TIP 2.8 STRL (NEEDLE) IMPLANT
ELECT NEEDLE TIP 2.8 STRL (NEEDLE) ×3 IMPLANT
ELECT REM PT RETURN 9FT ADLT (ELECTROSURGICAL)
ELECTRODE REM PT RTRN 9FT ADLT (ELECTROSURGICAL) IMPLANT
GLOVE BIOGEL PI IND STRL 8 (GLOVE) IMPLANT
GLOVE BIOGEL PI INDICATOR 8 (GLOVE) ×2
GLOVE BIOGEL PI ORTHO PRO 7.5 (GLOVE) ×2
GLOVE BIOGEL PI ORTHO PRO SZ8 (GLOVE) ×2
GLOVE ORTHO TXT STRL SZ7.5 (GLOVE) ×3 IMPLANT
GLOVE PI ORTHO PRO STRL 7.5 (GLOVE) ×1 IMPLANT
GLOVE PI ORTHO PRO STRL SZ8 (GLOVE) ×1 IMPLANT
GLOVE SURG ORTHO 8.5 STRL (GLOVE) ×3 IMPLANT
GLOVE SURG SS PI 7.0 STRL IVOR (GLOVE) ×2 IMPLANT
GOWN STRL REUS W/ TWL XL LVL3 (GOWN DISPOSABLE) ×4 IMPLANT
GOWN STRL REUS W/TWL XL LVL3 (GOWN DISPOSABLE) ×12
KIT BASIN OR (CUSTOM PROCEDURE TRAY) ×3 IMPLANT
KIT ROOM TURNOVER OR (KITS) ×3 IMPLANT
MANIFOLD NEPTUNE II (INSTRUMENTS) ×3 IMPLANT
NDL HYPO 25GX1X1/2 BEV (NEEDLE) ×1 IMPLANT
NDL SPNL 18GX3.5 QUINCKE PK (NEEDLE) ×1 IMPLANT
NDL SUT .5 MAYO 1.404X.05X (NEEDLE) ×1 IMPLANT
NDL SUT 6 .5 CRC .975X.05 MAYO (NEEDLE) ×1 IMPLANT
NEEDLE HYPO 25GX1X1/2 BEV (NEEDLE) ×3 IMPLANT
NEEDLE MAYO TAPER (NEEDLE) ×6
NEEDLE SPNL 18GX3.5 QUINCKE PK (NEEDLE) ×3 IMPLANT
NS IRRIG 1000ML POUR BTL (IV SOLUTION) ×3 IMPLANT
PACK SHOULDER (CUSTOM PROCEDURE TRAY) ×3 IMPLANT
PAD ABD 8X10 STRL (GAUZE/BANDAGES/DRESSINGS) ×2 IMPLANT
PAD ARMBOARD 7.5X6 YLW CONV (MISCELLANEOUS) ×6 IMPLANT
RESECTOR FULL RADIUS 4.2MM (BLADE) ×3 IMPLANT
SET ARTHROSCOPY TUBING (MISCELLANEOUS) ×3
SET ARTHROSCOPY TUBING LN (MISCELLANEOUS) ×1 IMPLANT
SLING ARM LRG ADULT FOAM STRAP (SOFTGOODS) ×1 IMPLANT
SLING ARM MED ADULT FOAM STRAP (SOFTGOODS) IMPLANT
SPONGE GAUZE 4X4 12PLY (GAUZE/BANDAGES/DRESSINGS) ×1 IMPLANT
SPONGE LAP 4X18 X RAY DECT (DISPOSABLE) ×4 IMPLANT
STRIP CLOSURE SKIN 1/2X4 (GAUZE/BANDAGES/DRESSINGS) ×3 IMPLANT
SUT FIBERWIRE #2 38 T-5 BLUE (SUTURE)
SUT MNCRL AB 3-0 PS2 18 (SUTURE) ×3 IMPLANT
SUT VIC AB 0 CT1 27 (SUTURE) ×3
SUT VIC AB 0 CT1 27XBRD ANBCTR (SUTURE) IMPLANT
SUT VIC AB 0 CT2 27 (SUTURE) ×4 IMPLANT
SUT VIC AB 2-0 CT1 27 (SUTURE) ×3
SUT VIC AB 2-0 CT1 TAPERPNT 27 (SUTURE) ×1 IMPLANT
SUT VICRYL 0 CT 1 36IN (SUTURE) ×3 IMPLANT
SUTURE FIBERWR #2 38 T-5 BLUE (SUTURE) IMPLANT
SYR CONTROL 10ML LL (SYRINGE) ×3 IMPLANT
TAPE CLOTH SURG 6X10 WHT LF (GAUZE/BANDAGES/DRESSINGS) ×2 IMPLANT
TOWEL OR 17X24 6PK STRL BLUE (TOWEL DISPOSABLE) ×3 IMPLANT
TOWEL OR 17X26 10 PK STRL BLUE (TOWEL DISPOSABLE) ×3 IMPLANT
TUBE CONNECTING 12'X1/4 (SUCTIONS)
TUBE CONNECTING 12X1/4 (SUCTIONS) ×1 IMPLANT
WAND HAND CNTRL MULTIVAC 90 (MISCELLANEOUS) ×5 IMPLANT
WATER STERILE IRR 1000ML POUR (IV SOLUTION) ×3 IMPLANT

## 2014-01-16 NOTE — Brief Op Note (Signed)
01/16/2014  1:21 PM  PATIENT:  Vernon Frye  53 y.o. male  PRE-OPERATIVE DIAGNOSIS:  left shoulder rotator cuff tear, SLAP lesion, AC joint OA  POST-OPERATIVE DIAGNOSIS:  left shoulder rotator cuff tear, SLAP lesion, AC joint OA  PROCEDURE:  Procedure(s): LEFT SHOULDER ARTHROSCOPY WITH SUBACROMIAL DECOMPRESSION AND MINI OPEN ROTATOR CUFF REPAIR, OPEN DISTAL CLAVICLE RESECTION AND TENDODESIS (Left) of Biceps Tendon  SURGEON:  Surgeon(s) and Role:    * Augustin Schooling, MD - Primary  PHYSICIAN ASSISTANT:   ASSISTANTS: Ventura Bruns, PA-C   ANESTHESIA:   Regional and General  EBL:  Total I/O In: 1800 [I.V.:1800] Out: 100 [Blood:100]  BLOOD ADMINISTERED:none  DRAINS: none   LOCAL MEDICATIONS USED:  MARCAINE     SPECIMEN:  No Specimen  DISPOSITION OF SPECIMEN:  N/A  COUNTS:  YES  TOURNIQUET:  * No tourniquets in log *  DICTATION: .Other Dictation: Dictation Number [id number]  PLAN OF CARE: Admit for overnight observation  PATIENT DISPOSITION:  PACU - hemodynamically stable.   Delay start of Pharmacological VTE agent (>24hrs) due to surgical blood loss or risk of bleeding: not applicable

## 2014-01-16 NOTE — Anesthesia Procedure Notes (Addendum)
Anesthesia Regional Block:  Interscalene brachial plexus block  Pre-Anesthetic Checklist: ,, timeout performed, Correct Patient, Correct Site, Correct Laterality, Correct Procedure, Correct Position, site marked, Risks and benefits discussed,  Surgical consent,  Pre-op evaluation,  At surgeon's request and post-op pain management  Laterality: Left  Prep: chloraprep       Needles:  Injection technique: Single-shot  Needle Type: Echogenic Stimulator Needle     Needle Length: 5cm 5 cm Needle Gauge: 22 and 22 G    Additional Needles:  Procedures: ultrasound guided (picture in chart) and nerve stimulator Interscalene brachial plexus block Narrative:  Start time: 01/16/2014 9:15 AM End time: 01/16/2014 9:20 AM Injection made incrementally with aspirations every 5 mL.  Performed by: Personally   Additional Notes: 20 cc 0.5% marcaine with 1:200 Epi injected easily   Procedure Name: Intubation Date/Time: 01/16/2014 10:50 AM Performed by: Ollen Bowl Pre-anesthesia Checklist: Patient identified, Emergency Drugs available, Suction available, Patient being monitored and Timeout performed Patient Re-evaluated:Patient Re-evaluated prior to inductionOxygen Delivery Method: Circle system utilized and Simple face mask Preoxygenation: Pre-oxygenation with 100% oxygen Intubation Type: Combination inhalational/ intravenous induction Ventilation: Mask ventilation with difficulty, Two handed mask ventilation required and Oral airway inserted - appropriate to patient size Laryngoscope Size: Mac and 4 Grade View: Grade I Tube type: Oral Tube size: 7.5 mm Number of attempts: 1 Airway Equipment and Method: Patient positioned with wedge pillow and Stylet Placement Confirmation: ETT inserted through vocal cords under direct vision,  positive ETCO2 and breath sounds checked- equal and bilateral Secured at: 21 cm Tube secured with: Tape Dental Injury: Teeth and Oropharynx as per pre-operative  assessment

## 2014-01-16 NOTE — Interval H&P Note (Signed)
History and Physical Interval Note:  01/16/2014 10:12 AM  Vernon Frye  has presented today for surgery, with the diagnosis of left shoulder rotator cuff tear  The various methods of treatment have been discussed with the patient and family. After consideration of risks, benefits and other options for treatment, the patient has consented to  Procedure(s): LEFT SHOULDER ARTHROSCOPY WITH SUBACROMIAL DECOMPRESSION AND MINI OPEN ROTATOR CUFF REPAIR, OPEN DISTAL CLAVICLE RESECTION AND TENDODESIS (Left) as a surgical intervention .  The patient's history has been reviewed, patient examined, no change in status, stable for surgery.  I have reviewed the patient's chart and labs.  Questions were answered to the patient's satisfaction.     NORRIS,STEVEN R

## 2014-01-16 NOTE — Progress Notes (Signed)
01/16/14 0831  OBSTRUCTIVE SLEEP APNEA  Have you ever been diagnosed with sleep apnea through a sleep study? No  Do you snore loudly (loud enough to be heard through closed doors)?  1  Do you often feel tired, fatigued, or sleepy during the daytime? 0  Has anyone observed you stop breathing during your sleep? 1  Do you have, or are you being treated for high blood pressure? 1  BMI more than 35 kg/m2? 0  Age over 53 years old? 1  Neck circumference greater than 40 cm/16 inches? 1  Gender: 1  Obstructive Sleep Apnea Score 6  Score 4 or greater  Results sent to PCP

## 2014-01-16 NOTE — Progress Notes (Signed)
Orthopedic Tech Progress Note Patient Details:  Vernon Frye [birth year] [id number]  Ortho Devices Type of Ortho Device: Abduction pillow Ortho Device/Splint Interventions: Application   Cammer, Theodoro Parma 01/16/2014, 10:21 AM

## 2014-01-16 NOTE — Discharge Instructions (Signed)
Ice the shoulder constantly.  Do exercises every hour for 5 minutes, start today - Lap slides, Internal and External Rotation Exercises, (hug self, then hitchhike), Pendulum exercises  Keep incision clean and dry for 5 days, then ok to shower  Ok to change to Band Aids on Wednesday, but keep dry until Friday  Wear sling out of the house. In the home, ok to remove sling and hug a pillow, sleep propped upright in recliner or bed with arm supported  Call for follow up appointment in the office with Merla Riches, PA-C in two weeks. [phone]  What to eat:  For your first meals, you should eat lightly; only small meals initially.  If you do not have nausea, you may eat larger meals.  Avoid spicy, greasy and heavy food.    General Anesthesia, Adult, Care After  Refer to this sheet in the next few weeks. These instructions provide you with information on caring for yourself after your procedure. Your health care provider may also give you more specific instructions. Your treatment has been planned according to current medical practices, but problems sometimes occur. Call your health care provider if you have any problems or questions after your procedure.  WHAT TO EXPECT AFTER THE PROCEDURE  After the procedure, it is typical to experience:  Sleepiness.  Nausea and vomiting. HOME CARE INSTRUCTIONS  For the first 24 hours after general anesthesia:  Have a responsible person with you.  Do not drive a car. If you are alone, do not take public transportation.  Do not drink alcohol.  Do not take medicine that has not been prescribed by your health care provider.  Do not sign important papers or make important decisions.  You may resume a normal diet and activities as directed by your health care provider.  Change bandages (dressings) as directed.  If you have questions or problems that seem related to general anesthesia, call the hospital and ask for the anesthetist or anesthesiologist on call. SEEK  MEDICAL CARE IF:  You have nausea and vomiting that continue the day after anesthesia.  You develop a rash. SEEK IMMEDIATE MEDICAL CARE IF:  You have difficulty breathing.  You have chest pain.  You have any allergic problems. Document Released: 10/20/2000 Document Revised: 03/16/2013 Document Reviewed: 01/27/2013  [facility] Patient Information 2014 [place], [place].

## 2014-01-16 NOTE — Anesthesia Preprocedure Evaluation (Addendum)
Anesthesia Evaluation  Patient identified by MRN, date of birth, ID band Patient awake    Reviewed: Allergy & Precautions, H&P , NPO status , Patient's Chart, lab work & pertinent test results, reviewed documented beta blocker date and time   Airway Mallampati: II TM Distance: >3 FB     Dental  (+) Dental Advisory Given,    Pulmonary pneumonia -, former smoker,          Cardiovascular hypertension, Pt. on medications and Pt. on home beta blockers     Neuro/Psych    GI/Hepatic   Endo/Other    Renal/GU      Musculoskeletal   Abdominal   Peds  Hematology   Anesthesia Other Findings   Reproductive/Obstetrics                         Anesthesia Physical Anesthesia Plan  ASA: III  Anesthesia Plan: General and Regional   Post-op Pain Management:    Induction: Intravenous  Airway Management Planned: Oral ETT  Additional Equipment:   Intra-op Plan:   Post-operative Plan: Extubation in OR  Informed Consent: I have reviewed the patients History and Physical, chart, labs and discussed the procedure including the risks, benefits and alternatives for the proposed anesthesia with the patient or authorized representative who has indicated his/her understanding and acceptance.   Dental advisory given  Plan Discussed with: CRNA and Anesthesiologist  Anesthesia Plan Comments:         Anesthesia Quick Evaluation

## 2014-01-16 NOTE — Transfer of Care (Signed)
Immediate Anesthesia Transfer of Care Note  Patient: Vernon Frye  Procedure(s) Performed: Procedure(s): LEFT SHOULDER ARTHROSCOPY WITH SUBACROMIAL DECOMPRESSION AND MINI OPEN ROTATOR CUFF REPAIR, OPEN DISTAL CLAVICLE RESECTION AND TENDODESIS (Left)  Patient Location: PACU  Anesthesia Type:GA combined with regional for post-op pain  Level of Consciousness: awake, alert  and oriented  Airway & Oxygen Therapy: Patient Spontanous Breathing and Patient connected to nasal cannula oxygen  Post-op Assessment: Report given to PACU RN and Post -op Vital signs reviewed and stable  Post vital signs: Reviewed and stable  Complications: No apparent anesthesia complications

## 2014-01-16 NOTE — Anesthesia Postprocedure Evaluation (Signed)
  Anesthesia Post-op Note  Patient: Vernon Frye  Procedure(s) Performed: Procedure(s): LEFT SHOULDER ARTHROSCOPY WITH SUBACROMIAL DECOMPRESSION AND MINI OPEN ROTATOR CUFF REPAIR, OPEN DISTAL CLAVICLE RESECTION AND TENDODESIS (Left)  Patient Location: PACU  Anesthesia Type:General  Level of Consciousness: awake, alert  and oriented  Airway and Oxygen Therapy: Patient Spontanous Breathing and Patient connected to nasal cannula oxygen  Post-op Pain: mild  Post-op Assessment: Post-op Vital signs reviewed, Patient's Cardiovascular Status Stable, Respiratory Function Stable, Patent Airway and Pain level controlled  Post-op Vital Signs: stable  Last Vitals:  Filed Vitals:   01/16/14 1512  BP: 124/76  Pulse: 65  Temp: 36.3 C  Resp:     Complications: No apparent anesthesia complications

## 2014-01-17 ENCOUNTER — Encounter (HOSPITAL_COMMUNITY): Payer: Self-pay | Admitting: Orthopedic Surgery

## 2014-01-17 LAB — BASIC METABOLIC PANEL
BUN: 16 mg/dL (ref 6–23)
CHLORIDE: 100 meq/L (ref 96–112)
CO2: 24 mEq/L (ref 19–32)
Calcium: 9.2 mg/dL (ref 8.4–10.5)
Creatinine, Ser: 0.72 mg/dL (ref 0.50–1.35)
Glucose, Bld: 129 mg/dL — ABNORMAL HIGH (ref 70–99)
POTASSIUM: 4.5 meq/L (ref 3.7–5.3)
SODIUM: 138 meq/L (ref 137–147)

## 2014-01-17 LAB — HEMOGLOBIN AND HEMATOCRIT, BLOOD
HCT: 38.1 % — ABNORMAL LOW (ref 39.0–52.0)
Hemoglobin: 13.1 g/dL (ref 13.0–17.0)

## 2014-01-17 NOTE — Evaluation (Signed)
I have read and agree with this note.   Time CV/ELF:810-175 Total time:59 minutes (EV, [place], 2TE)  [place], OTR/L [phone]

## 2014-01-17 NOTE — Op Note (Signed)
NAMERAPHEAL, MASSO NO.:  [id number]  MEDICAL RECORD NO.:  [id number]  LOCATION:  5N08C                        FACILITY:  [facility]  PHYSICIAN:  Doran Heater. Veverly Fells, M.D. DATE OF BIRTH:  [birth year]  DATE OF PROCEDURE:  01/16/2014 DATE OF DISCHARGE:                              OPERATIVE REPORT   PREOPERATIVE DIAGNOSIS:  Left shoulder rotator cuff tear, SLAP lesion, AC joint arthritis.  POSTOPERATIVE DIAGNOSIS:  Left shoulder rotator cuff tear, SLAP lesion, AC joint arthritis, glenohumeral joint arthritis, pan-labral tear.  PROCEDURE PERFORMED:  Left shoulder arthroscopy with extensive intra- articular debridement of torn superior labrum, anterior and posterior arthroscopic biceps tenotomy, debridement of pan labral tear, arthroscopic decompression of paralabral cyst, arthroscopic subacromial decompression, arthroscopic chondroplasty glenohumeral joint followed by a mini open rotator cuff repair, biceps tenodesis in the groove, and open distal clavicle resection.  ATTENDING SURGEON:  Doran Heater. Veverly Fells, MD.  ASSISTANT:  Abbott Pao. Dixon, PA-C, who has scrubbed the entire procedure and necessary for satisfactory completion of surgery.  ANESTHESIA:  General anesthesia was used plus interscalene block.  ESTIMATED BLOOD LOSS:  Minimal.  FLUID REPLACEMENT:  1000 mL crystalloid.  INSTRUMENT COUNTS:  Correct.  COMPLICATIONS:  There were no complications.  ANTIBIOTICS:  Perioperative antibiotics were given.  INDICATIONS:  The patient is a 53 year old male with worsening left shoulder pain after an injury to his shoulder.  The patient has had progressive pain despite conservative management.  The patient has an MRI scan indicating pan tearing of his labrum as well as rotator cuff. The patient has failed conservative management.  The patient also has advanced AC joint arthritis without limb impingement, some glenohumeral chondromalacia.  Having failed conservative  management desiring operative treatment, the patient presents for surgery.  Informed consent obtained.  DESCRIPTION OF PROCEDURE:  After adequate level of anesthesia achieved, the patient was positioned in the modified beach-chair position.  Left shoulder correctly identified and sterilely prepped and draped in usual manner.  We did do an exam under anesthesia, identifying some mild end range stiffness in all planes, but not anything was consistent with frozen shoulder.  Basically forward elevation was about 150 -160, abduction 95-100, external rotation 60, internal rotation to his posterior belt line, again that was passively done prior to our prep and drape.  We then did our prep and drape.  Time-out called.  We entered the shoulder in standard portals including anterior, posterior and lateral portals.  We identified significant synovitis throughout the shoulder joint.  There was extensive tearing of the biceps, superior labral junction.  We performed a biceps tenotomy and labral debridement back to stable labral rim.  The majority of the superior labrum had to be removed.  Subscapularis was intact.  We did also note that there was anterior, inferior and posterior labral tearing requiring nearly a subtotal labrum removal which we did with basket forceps and motorized shaver.  We used the ArthroCare to control bleeding and also to remove some hypertrophic synovium for better visualization.  There was extensive chondromalacia/away in the shoulder joint.  We noted there to be a couple areas on the glenoid face where appeared to be the bone. Loose flaps and fibrillation  noted everywhere.  We performed a chondroplasty, not bleeding the bone, but just trying to remove all unstable cartilage tissue to provide smooth gliding surface which we did with our suction shaver.  We had a much nicer cleaned up shoulder. There was also undersurface tearing of the supraspinatus, infraspinatus junction.   We debrided that using suction shaver.  At this point, we concluded the intra-articular portion of the arthroscopic surgery and transitioned into subacromial work.  We placed the scope in subacromial space through the lateral portal and identified quite a bit of bursitis. We removed the bursa such that we could see.  We then performed acromioplasty using a high-speed motorized burr in a standard butcher block technique from the posterior portal.  We were able to affect a nice subacromial decompression with CA ligament release and really cleaning all that bursa, we could palpate a soft spot near the junction of the rotator cuff between the supraspinatus and infraspinatus tendons on our diagnostic arthroscopy.  We then went ahead at this point and concluded the arthroscopic portion of procedure.  We went ahead and made a saber incision overlying the AC joint.  Dissection down through subcutaneous tissues.  Using needle-tip Bovie identified the deltotrapezial fascia and divided that in line with distal clavicle. Subperiosteal dissection of distal clavicle performed followed by excision of distal 2-3 mm using oscillating saw.  We thoroughly irrigated the AC interval.  We applied bone wax to the cut in the clavicle.  We then repaired deltotrapezius fascia with interrupted 0 Vicryl suture followed by 2-0 Vicryl subcutaneous closure and 4-0 Monocryl for skin.  We next approached the rotator cuff and biceps tenodesis through a single mini open incision, started at the anterolateral border of the acromion staying distally about 3-4 cm. Dissection was down through subcutaneous tissues and using needle tip Bovie, identified the deltoid raphe between the anterior and lateral heads of the deltoid, developed that using needle tip Bovie.  Placed our Arthrex retractor exposing the deeper layer.  We identified the soft tissue covering the biceps groove and we divided that using the needle- tip Bovie.  We  went ahead and used the needle-tip Bovie and the Soil scientist for repair of the floor of the groove for the tenodesis.  We placed a single Y-Flex anchor through the floor of the biceps groove bringing the suture up in a whipstitch fashion through the biceps tendon with the elbow at 90 degrees mid tension.  We had nice low-profile repair, oversewn with 0 Vicryl suture x2 figure-of-eight.  We had a really solid repair there.  We next went ahead and addressed the rotator cuff tear.  I could palpate just at the junction of the infra and supraspinatus tendons.  There was a significant soft spot consistent with the tendinosis and interstitial tearing we saw on the skin.  We divided the tendon longitudinally using a knife, immediately encountering the tendinopathic tissue.  We used a rongeur to clean that out and prepared the rotator cuff footprint with a curette for healing. We did side-to-side repair with #2 HiFi suture for a strong repair. Basically the soft spot was gone.  We had eliminated that.  There was no gapping.  There was no significant damage to the tendon.  We ranged the shoulder fully.  No impingement noted.  We palpated aggressive tendon, felt great.  At this point, we thoroughly irrigated.  We then went ahead and repaired all deltoid anatomically with 0 Vicryl suture followed by 2-0 Vicryl subcutaneous closure  and 4-0 Monocryl for skin and portals.  Steri-Strips applied, followed by sterile dressing.  The patient tolerated the surgery well.     Doran Heater. Veverly Fells, M.D.     SRN/MEDQ  D:  01/16/2014  T:  01/17/2014  Job:  [id number]

## 2014-01-17 NOTE — Evaluation (Signed)
Occupational Therapy Evaluation and Discharge Patient Details Name: Vernon Frye MRN: [id number] DOB: 11-18-1960 Today's Date: 01/17/2014    History of Present Illness s/p LEFT SHOULDER ARTHROSCOPY WITH SUBACROMIAL DECOMPRESSION AND MINI OPEN ROTATOR CUFF REPAIR, OPEN DISTAL CLAVICLE RESECTION AND TENDODESIS (Left) of Biceps Tendon   Clinical Impression   PTA pt was independent with ADLs and now presents with acute pain and decreased ROM from above limiting his independence with self care tasks. Educated and demonstrated with pt UB bathing/dressing sequencing and compensatory strategies, wearing schedule and how to don/doff sling, and positioning of L UE during sleep. Educated on and practiced pendulum and AROM elbow/wrist/hand exercises; pt verbalized knowledge of AAROM shoulder ER (to tolerance) and forward flexion (to tolerance) but was unable to return demonstration of understanding of exercises due to pain. Pt will have intermittent assistance at home from friends and therefore has no further OT needs, we will sign off.    Follow Up Recommendations  No OT follow up    Equipment Recommendations  None recommended by OT       Precautions / Restrictions Precautions Precautions: Shoulder Shoulder Interventions: Shoulder sling/immobilizer;Shoulder abduction pillow;For comfort Precaution Booklet Issued: Yes (comment) (educated pt on shoulder protocol handout ) Restrictions Weight Bearing Restrictions: Yes  Pt stated that MD told him to "use it, use it, use it" and that it was OK for him to wipe off counter tops with his LUE.     Mobility Bed Mobility Overal bed mobility: Modified Independent             General bed mobility comments: increased time  Transfers Overall transfer level: Modified independent Equipment used: None             General transfer comment: increased time to power up from sitting using R UE only    Balance Overall balance assessment: Needs  assistance   Sitting balance-Leahy Scale: Good       Standing balance-Leahy Scale: Good                              ADL                                         General ADL Comments: Pt independent pta, and now is at a modified independent level for ADLs due to shoulder pain and limited ROM requiring increased time for tasks.               Pertinent Vitals/Pain Pt c/o of pain at beginning of tx but did not rate. Nurse was notified and gave pt pain medicine at beginning of tx. Pt was repositioned in chair with pillow under arm at the end of tx.        Extremity/Trunk Assessment Upper Extremity Assessment Upper Extremity Assessment: Generalized weakness;LUE deficits/detail LUE Deficits / Details: acute pain in shoulder from procedure LUE: Unable to fully assess due to pain LUE Coordination: decreased gross motor   Lower Extremity Assessment Lower Extremity Assessment: Overall WFL for tasks assessed   Cervical / Trunk Assessment Cervical / Trunk Assessment: Normal   Communication Communication Communication: No difficulties   Cognition Arousal/Alertness: Awake/alert Behavior During Therapy: WFL for tasks assessed/performed Overall Cognitive Status: Within Functional Limits for tasks assessed  Exercises   Other Exercises Other Exercises: Educated and had pt practice pendulum exercises and AROM elbow/wrist/hand exercises; Educated pt on proper technique for AAROM shoulder ER (to tolerance) and forward flexion (to tolerance), although pt was unable to return demonstration of knowledge due to pain, he verbalized understanding of technique and was given handout   Shoulder Instructions Shoulder Instructions Donning/doffing shirt without moving shoulder: Modified independent Method for sponge bathing under operated UE: Modified independent Donning/doffing sling/immobilizer: Modified independent (pt stated that he  more than likely will not wear the sling, which the MD told him was up to him) Correct positioning of sling/immobilizer: Modified independent Pendulum exercises (written home exercise program): Modified independent ROM for elbow, wrist and digits of operated UE: Modified independent Sling wearing schedule (on at all times/off for ADL's): Independent Proper positioning of operated UE when showering: Modified independent Positioning of UE while sleeping: Modified independent    Home Living Family/patient expects to be discharged to:: Private residence Living Arrangements: Alone Available Help at Discharge: Friend(s) Type of Home: [place]: One level     Bathroom Shower/Tub: Tub/shower unit Shower/tub characteristics: Architectural technologist: Handicapped height     Home Equipment: None          Prior Functioning/Environment Level of Independence: Independent                      OT Goals(Current goals can be found in the care plan section) Acute Rehab OT Goals Patient Stated Goal: to go home OT Goal Formulation: With patient Time For Goal Achievement: 01/24/14 Potential to Achieve Goals: Good                End of Session    Activity Tolerance: Patient limited by pain Patient left: in chair;with call bell/phone within reach;with family/visitor present   Time:  -    Charges:    G-CodesLyda Perone 02-10-2014, 11:02 AM

## 2014-01-17 NOTE — Progress Notes (Signed)
Orthopedics Progress Note  Subjective: It hurts  Objective:  Filed Vitals:   01/17/14 0454  BP: 155/82  Pulse: 81  Temp: 97.4 F (36.3 C)  Resp: 18    General: Awake and alert  Musculoskeletal: left shoulder dressing intact, movement looks good with pain Neurovascularly intact  Lab Results  Component Value Date   WBC 7.9 01/16/2014   HGB 13.1 01/17/2014   HCT 38.1* 01/17/2014   MCV 91.3 01/16/2014   PLT 274 01/16/2014       Component Value Date/Time   NA 138 01/17/2014 0500   K 4.5 01/17/2014 0500   CL 100 01/17/2014 0500   CO2 24 01/17/2014 0500   GLUCOSE 129* 01/17/2014 0500   BUN 16 01/17/2014 0500   CREATININE 0.72 01/17/2014 0500   CALCIUM 9.2 01/17/2014 0500   GFRNONAA >90 01/17/2014 0500   GFRAA >90 01/17/2014 0500    No results found for this basename: INR, PROTIME    Assessment/Plan: POD #1 s/p Procedure(s): LEFT SHOULDER ARTHROSCOPY WITH SUBACROMIAL DECOMPRESSION AND MINI OPEN ROTATOR CUFF REPAIR, OPEN DISTAL CLAVICLE RESECTION AND TENDODESIS Stable for D/C home  Remo Lipps R. Veverly Fells, MD 01/17/2014 7:59 AM

## 2014-01-17 NOTE — Progress Notes (Signed)
Utilization review completed.  

## 2014-01-17 NOTE — Discharge Summary (Signed)
Physician Discharge Summary   Patient ID: Vernon Frye MRN: [id number] DOB/AGE: 02-23-1961 53 y.o.  Admit date: 01/16/2014 Discharge date: 01/17/2014  Admission Diagnoses:  Active Problems:   Rotator cuff tear   Discharge Diagnoses:  Same   Surgeries: Procedure(s): LEFT SHOULDER ARTHROSCOPY WITH SUBACROMIAL DECOMPRESSION AND MINI OPEN ROTATOR CUFF REPAIR, OPEN DISTAL CLAVICLE RESECTION AND TENDODESIS on 01/16/2014   Consultants: OT  Discharged Condition: Stable  Hospital Course: Vernon Frye is an 53 y.o. male who was admitted 01/16/2014 with a chief complaint of left shoulder pain, and found to have a diagnosis of left shoulder rotator cuff tear.  They were brought to the operating room on 01/16/2014 and underwent the above named procedures.    The patient had an uncomplicated hospital course and was stable for discharge.  Recent vital signs:  Filed Vitals:   01/17/14 0454  BP: 155/82  Pulse: 81  Temp: 97.4 F (36.3 C)  Resp: 18    Recent laboratory studies:  Results for orders placed during the hospital encounter of 01/16/14  CBC      Result Value Ref Range   WBC 7.9  4.0 - 10.5 K/uL   RBC 4.61  4.22 - 5.81 MIL/uL   Hemoglobin 14.6  13.0 - 17.0 g/dL   HCT 42.1  39.0 - 52.0 %   MCV 91.3  78.0 - 100.0 fL   MCH 31.7  26.0 - 34.0 pg   MCHC 34.7  30.0 - 36.0 g/dL   RDW 12.5  11.5 - 15.5 %   Platelets 274  150 - 400 K/uL  BASIC METABOLIC PANEL      Result Value Ref Range   Sodium 139  137 - 147 mEq/L   Potassium 4.2  3.7 - 5.3 mEq/L   Chloride 101  96 - 112 mEq/L   CO2 26  19 - 32 mEq/L   Glucose, Bld 119 (*) 70 - 99 mg/dL   BUN 16  6 - 23 mg/dL   Creatinine, Ser 0.87  0.50 - 1.35 mg/dL   Calcium 9.5  8.4 - 10.5 mg/dL   GFR calc non Af Amer >90  >90 mL/min   GFR calc Af Amer >90  >90 mL/min  HEMOGLOBIN AND HEMATOCRIT, BLOOD      Result Value Ref Range   Hemoglobin 13.1  13.0 - 17.0 g/dL   HCT 38.1 (*) 39.0 - 51.0 %  BASIC METABOLIC PANEL      Result  Value Ref Range   Sodium 138  137 - 147 mEq/L   Potassium 4.5  3.7 - 5.3 mEq/L   Chloride 100  96 - 112 mEq/L   CO2 24  19 - 32 mEq/L   Glucose, Bld 129 (*) 70 - 99 mg/dL   BUN 16  6 - 23 mg/dL   Creatinine, Ser 0.72  0.50 - 1.35 mg/dL   Calcium 9.2  8.4 - 10.5 mg/dL   GFR calc non Af Amer >90  >90 mL/min   GFR calc Af Amer >90  >90 mL/min    Discharge Medications:     Medication List         ALPRAZolam 1 MG tablet  Commonly known as:  XANAX  Take 1 mg by mouth 3 (three) times daily as needed for anxiety or sleep.     amLODipine 5 MG tablet  Commonly known as:  NORVASC  Take 5 mg by mouth daily.     carisoprodol 350 MG tablet  Commonly known as:  SOMA  Take 350 mg by mouth 3 (three) times daily as needed for muscle spasms.     HYDROmorphone 2 MG tablet  Commonly known as:  DILAUDID  Take 1 tablet (2 mg total) by mouth every 4 (four) hours as needed for severe pain.     lisinopril-hydrochlorothiazide 20-12.5 MG per tablet  Commonly known as:  PRINZIDE,ZESTORETIC  Take 1 tablet by mouth 2 (two) times daily.     metaxalone 800 MG tablet  Commonly known as:  SKELAXIN  Take 1 tablet (800 mg total) by mouth 4 (four) times daily as needed for muscle spasms.     metoprolol succinate 50 MG 24 hr tablet  Commonly known as:  TOPROL-XL  Take 50 mg by mouth daily. Take with or immediately following a meal.     naproxen 500 MG tablet  Commonly known as:  NAPROSYN  Take 500 mg by mouth 2 (two) times daily as needed for mild pain.     oxyCODONE-acetaminophen 10-325 MG per tablet  Commonly known as:  PERCOCET  Take 1 tablet by mouth every 8 (eight) hours as needed for pain.        Diagnostic Studies: Dg Chest 2 View  01/16/2014   CLINICAL DATA:  Hypertension.  EXAM: CHEST  2 VIEW  COMPARISON:  February 14, 2010.  FINDINGS: The heart size and mediastinal contours are within normal limits. Both lungs are clear. No pneumothorax or pleural effusion is noted. The visualized skeletal  structures are unremarkable.  IMPRESSION: No acute cardiopulmonary abnormality seen.   Electronically Signed   By: Sabino Dick M.D.   On: 01/16/2014 08:38    Disposition: home        Follow-up Information   Follow up with Fontaine Kossman,STEVEN R, MD. Call in 2 weeks. (Make appointment with Ventura Bruns, PA-C in two weeks [phone])    Specialty:  Orthopedic Surgery   Contact information:   [address] [place] [address] [phone]        Signed: Augustin Schooling 01/17/2014, 8:00 AM

## 2015-06-11 NOTE — Patient Instructions (Addendum)
Vernon Frye  06/11/2015   Your procedure is scheduled on: 06-19-15 Tuesday  Report to [facility]  Entrance take [facility]  elevators to 3rd floor to  [place] at 12:45 PM.  Call this number if you have problems the morning of surgery [phone]   Remember: ONLY 1 PERSON MAY GO WITH YOU TO SHORT STAY TO GET  READY MORNING OF Vernon Frye.  Do not eat food After Midnight but can take clear liquids 9:45 am day of surgery then nothing by mouth.      Take these medicines the morning of surgery with A SIP OF WATER: Amlodipine (Norvasc); Metoprolol; Alprazolam (Xanax) if needed; Oxycodone-Acetaminophen if needed                                You may not have any metal on your body including hair pins and              piercings  Do not wear jewelry, lotions, powders or colognes, deodorant                           Men may shave face and neck.   Do not bring valuables to the hospital. Vernon Frye.  Contacts, dentures or bridgework may not be worn into surgery.  Leave suitcase in the car. After surgery it may be brought to your room.                Please read over the following fact sheets you were given:MRSA INFORMATION SHEET; INCENTIVE SPIROMETER; BLOOD TRANSFUSION INFORMATION SHEET _____________________________________________________________________             [facility] - Preparing for Surgery Before surgery, you can play an important role.  Because skin is not sterile, your skin needs to be as free of germs as possible.  You can reduce the number of germs on your skin by washing with CHG (chlorahexidine gluconate) soap before surgery.  CHG is an antiseptic cleaner which kills germs and bonds with the skin to continue killing germs even after washing. Please DO NOT use if you have an allergy to CHG or antibacterial soaps.  If your skin becomes reddened/irritated stop using the CHG and inform your  nurse when you arrive at Short Stay. Do not shave (including legs and underarms) for at least 48 hours prior to the first CHG shower.  You may shave your face/neck. Please follow these instructions carefully:  1.  Shower with CHG Soap the night before surgery and the  morning of Surgery.  2.  If you choose to wash your hair, wash your hair first as usual with your  normal  shampoo.  3.  After you shampoo, rinse your hair and body thoroughly to remove the  shampoo.                           4.  Use CHG as you would any other liquid soap.  You can apply chg directly  to the skin and wash                       Gently  with a scrungie or clean washcloth.  5.  Apply the CHG Soap to your body ONLY FROM THE NECK DOWN.   Do not use on face/ open                           Wound or open sores. Avoid contact with eyes, ears mouth and genitals (private parts).                       Wash face,  Genitals (private parts) with your normal soap.             6.  Wash thoroughly, paying special attention to the area where your surgery  will be performed.  7.  Thoroughly rinse your body with warm water from the neck down.  8.  DO NOT shower/wash with your normal soap after using and rinsing off  the CHG Soap.                9.  Pat yourself dry with a clean towel.            10.  Wear clean pajamas.            11.  Place clean sheets on your bed the night of your first shower and do not  sleep with pets. Day of Surgery : Do not apply any lotions/deodorants the morning of surgery.  Please wear clean clothes to the hospital/surgery center.  FAILURE TO FOLLOW THESE INSTRUCTIONS MAY RESULT IN THE CANCELLATION OF YOUR SURGERY PATIENT SIGNATURE_________________________________  NURSE SIGNATURE__________________________________  ________________________________________________________________________   Vernon Frye  An incentive spirometer is a tool that can help keep your lungs clear and active. This tool  measures how well you are filling your lungs with each breath. Taking long deep breaths may help reverse or decrease the chance of developing breathing (pulmonary) problems (especially infection) following:  A long period of time when you are unable to move or be active. BEFORE THE PROCEDURE   If the spirometer includes an indicator to show your best effort, your nurse or respiratory therapist will set it to a desired goal.  If possible, sit up straight or lean slightly forward. Try not to slouch.  Hold the incentive spirometer in an upright position. INSTRUCTIONS FOR USE   Sit on the edge of your bed if possible, or sit up as far as you can in bed or on a chair.  Hold the incentive spirometer in an upright position.  Breathe out normally.  Place the mouthpiece in your mouth and seal your lips tightly around it.  Breathe in slowly and as deeply as possible, raising the piston or the ball toward the top of the column.  Hold your breath for 3-5 seconds or for as long as possible. Allow the piston or ball to fall to the bottom of the column.  Remove the mouthpiece from your mouth and breathe out normally.  Rest for a few seconds and repeat Steps 1 through 7 at least 10 times every 1-2 hours when you are awake. Take your time and take a few normal breaths between deep breaths.  The spirometer may include an indicator to show your best effort. Use the indicator as a goal to work toward during each repetition.  After each set of 10 deep breaths, practice coughing to be sure your lungs are clear. If you have an incision (the cut made at the time of surgery), support  your incision when coughing by placing a pillow or rolled up towels firmly against it. Once you are able to get out of bed, walk around indoors and cough well. You may stop using the incentive spirometer when instructed by your caregiver.  RISKS AND COMPLICATIONS  Take your time so you do not get dizzy or light-headed.  If  you are in pain, you may need to take or ask for pain medication before doing incentive spirometry. It is harder to take a deep breath if you are having pain. AFTER USE  Rest and breathe slowly and easily.  It can be helpful to keep track of a log of your progress. Your caregiver can provide you with a simple table to help with this. If you are using the spirometer at home, follow these instructions: [place] IF:   You are having difficultly using the spirometer.  You have trouble using the spirometer as often as instructed.  Your pain medication is not giving enough relief while using the spirometer.  You develop fever of 100.5 F (38.1 C) or higher. SEEK IMMEDIATE MEDICAL CARE IF:   You cough up bloody sputum that had not been present before.  You develop fever of 102 F (38.9 C) or greater.  You develop worsening pain at or near the incision site. MAKE SURE YOU:   Understand these instructions.  Will watch your condition.  Will get help right away if you are not doing well or get worse. Document Released: 11/24/2006 Document Revised: 10/06/2011 Document Reviewed: 01/25/2007 ExitCare Patient Information 2014 ExitCare, [place].   ________________________________________________________________________  WHAT IS A BLOOD TRANSFUSION? Blood Transfusion Information  A transfusion is the replacement of blood or some of its parts. Blood is made up of multiple cells which provide different functions.  Red blood cells carry oxygen and are used for blood loss replacement.  White blood cells fight against infection.  Platelets control bleeding.  Plasma helps clot blood.  Other blood products are available for specialized needs, such as hemophilia or other clotting disorders. BEFORE THE TRANSFUSION  Who gives blood for transfusions?   Healthy volunteers who are fully evaluated to make sure their blood is safe. This is blood bank blood. Transfusion therapy is the  safest it has ever been in the practice of medicine. Before blood is taken from a donor, a complete history is taken to make sure that person has no history of diseases nor engages in risky social behavior (examples are intravenous drug use or sexual activity with multiple partners). The donor's travel history is screened to minimize risk of transmitting infections, such as malaria. The donated blood is tested for signs of infectious diseases, such as HIV and hepatitis. The blood is then tested to be sure it is compatible with you in order to minimize the chance of a transfusion reaction. If you or a relative donates blood, this is often done in anticipation of surgery and is not appropriate for emergency situations. It takes many days to process the donated blood. RISKS AND COMPLICATIONS Although transfusion therapy is very safe and saves many lives, the main dangers of transfusion include:   Getting an infectious disease.  Developing a transfusion reaction. This is an allergic reaction to something in the blood you were given. Every precaution is taken to prevent this. The decision to have a blood transfusion has been considered carefully by your caregiver before blood is given. Blood is not given unless the benefits outweigh the risks. AFTER THE TRANSFUSION  Right after receiving a blood transfusion, you will usually feel much better and more energetic. This is especially true if your red blood cells have gotten low (anemic). The transfusion raises the level of the red blood cells which carry oxygen, and this usually causes an energy increase.  The nurse administering the transfusion will monitor you carefully for complications. HOME CARE INSTRUCTIONS  No special instructions are needed after a transfusion. You may find your energy is better. Speak with your caregiver about any limitations on activity for underlying diseases you may have. SEEK MEDICAL CARE IF:   Your condition is not improving  after your transfusion.  You develop redness or irritation at the intravenous (IV) site. SEEK IMMEDIATE MEDICAL CARE IF:  Any of the following symptoms occur over the next 12 hours:  Shaking chills.  You have a temperature by mouth above 102 F (38.9 C), not controlled by medicine.  Chest, back, or muscle pain.  People around you feel you are not acting correctly or are confused.  Shortness of breath or difficulty breathing.  Dizziness and fainting.  You get a rash or develop hives.  You have a decrease in urine output.  Your urine turns a dark color or changes to pink, red, or brown. Any of the following symptoms occur over the next 10 days:  You have a temperature by mouth above 102 F (38.9 C), not controlled by medicine.  Shortness of breath.  Weakness after normal activity.  The white part of the eye turns yellow (jaundice).  You have a decrease in the amount of urine or are urinating less often.  Your urine turns a dark color or changes to pink, red, or brown. Document Released: 07/11/2000 Document Revised: 10/06/2011 Document Reviewed: 02/28/2008 ExitCare Patient Information 2014 ExitCare, [place].  _______________________________________________________________________    CLEAR LIQUID DIET   Foods Allowed                                                                     Foods Excluded  Coffee and tea, regular and decaf                             liquids that you cannot  Plain Jell-O in any flavor                                             see through such as: Fruit ices (not with fruit pulp)                                     milk, soups, orange juice  Iced Popsicles                                    All solid food Carbonated beverages, regular and diet  Cranberry, grape and apple juices Sports drinks like Gatorade Lightly seasoned clear broth or consume(fat free) Sugar, honey syrup  Sample Menu Breakfast                                 Lunch                                     Supper Cranberry juice                    Beef broth                            Chicken broth Jell-O                                     Grape juice                           Apple juice Coffee or tea                        Jell-O                                      Popsicle                                                Coffee or tea                        Coffee or tea  _____________________________________________________________________

## 2015-06-11 NOTE — H&P (Signed)
TOTAL KNEE ADMISSION H&P  Patient is being admitted for left total knee arthroplasty.  Subjective:  Chief Complaint:     Left knee primary OA / pain  HPI: Vernon Frye, 54 y.o. male, has a history of pain and functional disability in the left knee due to trauma and arthritis and has failed non-surgical conservative treatments for greater than 12 weeks to include NSAID's and/or analgesics, corticosteriod injections, viscosupplementation injections, use of assistive devices and activity modification.  Onset of symptoms was gradual, starting 05/10/2013 with gradually worsening course since that time. The patient noted prior procedures on the knee to include  arthroscopy on the left knee(s).  Patient currently rates pain in the left knee(s) at 10 out of 10 with activity. Patient has night pain, worsening of pain with activity and weight bearing, pain that interferes with activities of daily living, pain with passive range of motion, crepitus and joint swelling.  Patient has evidence of periarticular osteophytes and joint space narrowing by imaging studies.  There is no active infection.  Risks, benefits and expectations were discussed with the patient.  Risks including but not limited to the risk of anesthesia, blood clots, nerve damage, blood vessel damage, failure of the prosthesis, infection and up to and including death.  Patient understand the risks, benefits and expectations and wishes to proceed with surgery.   PCP: No PCP Per Patient  D/C Plans:      Home with HHPT  Post-op Meds:       No Rx given   Tranexamic Acid:      To be given - IV  Decadron:      Is to be given  FYI:     ASA post-op  Dilaudid PO    Patient Active Problem List   Diagnosis Date Noted  . Rotator cuff tear 01/16/2014  . HYPERTENSION 01/31/2010  . PNEUMONIA 01/31/2010  . SWELLING, MASS, OR LUMP IN CHEST 01/31/2010   Past Medical History  Diagnosis Date  . Hypertension     takes Toprol,Prinizide,and  Amlodipine daily  . Arthritis   . Joint pain   . Joint swelling   . Chronic back pain     DDD  . Night muscle spasms     takes Soma daily  . History of kidney stones   . Anxiety     takes Xanax daily    Past Surgical History  Procedure Laterality Date  . Lithotripsy    . Hernia repair Bilateral as a baby    inguinal-  . Shoulder arthroscopy with subacromial decompression and open rotator c Left 01/16/2014    Procedure: LEFT SHOULDER ARTHROSCOPY WITH SUBACROMIAL DECOMPRESSION AND MINI OPEN ROTATOR CUFF REPAIR, OPEN DISTAL CLAVICLE RESECTION AND TENDODESIS;  Surgeon: Augustin Schooling, MD;  Location: [facility];  Service: Orthopedics;  Laterality: Left;    No prescriptions prior to admission   No Known Allergies   Social History  Substance Use Topics  . Smoking status: Former Research scientist (life sciences)  . Smokeless tobacco: Not on file     Comment: quit smoking 8 yrs ago  . Alcohol Use: Yes     Comment: 3 beers daily       Review of Systems  Constitutional: Negative.   HENT: Negative.   Eyes: Negative.   Respiratory: Negative.   Cardiovascular: Negative.   Gastrointestinal: Negative.   Genitourinary: Negative.   Musculoskeletal: Positive for back pain and joint pain.  Skin: Negative.   Neurological: Negative.   Endo/Heme/Allergies: Negative.   Psychiatric/Behavioral:  The patient is nervous/anxious.     Objective:  Physical Exam  Constitutional: He is oriented to person, place, and time. He appears well-developed and well-nourished.  HENT:  Head: Normocephalic.  Eyes: Pupils are equal, round, and reactive to light.  Neck: Neck supple. No JVD present. No tracheal deviation present. No thyromegaly present.  Cardiovascular: Normal rate, regular rhythm, normal heart sounds and intact distal pulses.   Respiratory: Effort normal and breath sounds normal. No stridor. No respiratory distress. He has no wheezes.  GI: Soft. There is no tenderness. There is no guarding.  Musculoskeletal:        Left knee: He exhibits decreased range of motion, swelling and bony tenderness. He exhibits no ecchymosis, no deformity, no laceration and no erythema. Tenderness found.  Lymphadenopathy:    He has no cervical adenopathy.  Neurological: He is alert and oriented to person, place, and time. A sensory deficit (bilateral LEs numbness / tingling) is present.  Skin: Skin is warm and dry.  Psychiatric: He has a normal mood and affect.      Labs:  Estimated body mass index is 31.25 kg/(m^2) as calculated from the following:   Height as of 01/16/14: 6\' 3"  (1.905 m).   Weight as of 01/16/14: 113.399 kg (250 lb).   Imaging Review Plain radiographs demonstrate severe degenerative joint disease of the left knee(s).  The bone quality appears to be good for age and reported activity level.  Assessment/Plan:  End stage arthritis, left knee   The patient history, physical examination, clinical judgment of the provider and imaging studies are consistent with end stage degenerative joint disease of the left knee(s) and total knee arthroplasty is deemed medically necessary. The treatment options including medical management, injection therapy arthroscopy and arthroplasty were discussed at length. The risks and benefits of total knee arthroplasty were presented and reviewed. The risks due to aseptic loosening, infection, stiffness, patella tracking problems, thromboembolic complications and other imponderables were discussed. The patient acknowledged the explanation, agreed to proceed with the plan and consent was signed. Patient is being admitted for inpatient treatment for surgery, pain control, PT, OT, prophylactic antibiotics, VTE prophylaxis, progressive ambulation and ADL's and discharge planning. The patient is planning to be discharged home with home health services.     West Pugh Atticus Lemberger   PA-C  06/11/2015, 12:06 PM

## 2015-06-12 ENCOUNTER — Encounter (HOSPITAL_COMMUNITY)
Admission: RE | Admit: 2015-06-12 | Discharge: 2015-06-12 | Disposition: A | Discharge status: Home / Self Care | Payer: Worker's Compensation | Source: Ambulatory Visit | Attending: Orthopedic Surgery | Admitting: Orthopedic Surgery

## 2015-06-12 ENCOUNTER — Encounter (HOSPITAL_COMMUNITY): Payer: Self-pay

## 2015-06-12 DIAGNOSIS — Z01810 Encounter for preprocedural cardiovascular examination: Secondary | ICD-10-CM | POA: Diagnosis not present

## 2015-06-12 DIAGNOSIS — Z01812 Encounter for preprocedural laboratory examination: Secondary | ICD-10-CM | POA: Diagnosis present

## 2015-06-12 HISTORY — DX: Personal history of other diseases of the musculoskeletal system and connective tissue: Z87.39

## 2015-06-12 HISTORY — DX: Anesthesia of skin: R20.0

## 2015-06-12 HISTORY — DX: Reserved for concepts with insufficient information to code with codable children: IMO0002

## 2015-06-12 HISTORY — DX: Pneumonia, unspecified organism: J18.9

## 2015-06-12 HISTORY — DX: Tinnitus, unspecified ear: H93.19

## 2015-06-12 HISTORY — DX: Other complications of anesthesia, initial encounter: T88.59XA

## 2015-06-12 HISTORY — DX: Anesthesia of skin: R20.2

## 2015-06-12 HISTORY — DX: Adverse effect of unspecified anesthetic, initial encounter: T41.45XA

## 2015-06-12 LAB — SURGICAL PCR SCREEN
MRSA, PCR: NEGATIVE
Staphylococcus aureus: NEGATIVE

## 2015-06-12 LAB — ABO/RH: ABO/RH(D): A POS

## 2015-06-12 LAB — URINALYSIS, ROUTINE W REFLEX MICROSCOPIC
Bilirubin Urine: NEGATIVE
GLUCOSE, UA: NEGATIVE mg/dL
Hgb urine dipstick: NEGATIVE
KETONES UR: NEGATIVE mg/dL
LEUKOCYTES UA: NEGATIVE
NITRITE: NEGATIVE
PROTEIN: NEGATIVE mg/dL
Specific Gravity, Urine: 1.01 (ref 1.005–1.030)
pH: 7.5 (ref 5.0–8.0)

## 2015-06-12 LAB — CBC
HEMATOCRIT: 45 % (ref 39.0–52.0)
HEMOGLOBIN: 15.5 g/dL (ref 13.0–17.0)
MCH: 31.7 pg (ref 26.0–34.0)
MCHC: 34.4 g/dL (ref 30.0–36.0)
MCV: 92 fL (ref 78.0–100.0)
Platelets: 254 10*3/uL (ref 150–400)
RBC: 4.89 MIL/uL (ref 4.22–5.81)
RDW: 12.3 % (ref 11.5–15.5)
WBC: 6.6 10*3/uL (ref 4.0–10.5)

## 2015-06-12 LAB — BASIC METABOLIC PANEL
ANION GAP: 8 (ref 5–15)
BUN: 17 mg/dL (ref 6–20)
CO2: 29 mmol/L (ref 22–32)
Calcium: 10 mg/dL (ref 8.9–10.3)
Chloride: 101 mmol/L (ref 101–111)
Creatinine, Ser: 0.95 mg/dL (ref 0.61–1.24)
GFR calc Af Amer: >60 mL/min (ref >60)
GFR calc non Af Amer: >60 mL/min (ref >60)
GLUCOSE: 136 mg/dL — AB (ref 65–99)
POTASSIUM: 4.9 mmol/L (ref 3.5–5.1)
Sodium: 138 mmol/L (ref 135–145)

## 2015-06-12 LAB — PROTIME-INR
INR: 1.02 (ref 0.00–1.49)
Prothrombin Time: 13.6 seconds (ref 11.6–15.2)

## 2015-06-12 LAB — APTT: aPTT: 27 seconds (ref 24–37)

## 2015-06-19 ENCOUNTER — Encounter (HOSPITAL_COMMUNITY): Payer: Self-pay | Admitting: *Deleted

## 2015-06-19 ENCOUNTER — Inpatient Hospital Stay (HOSPITAL_COMMUNITY): Payer: Worker's Compensation | Admitting: Anesthesiology

## 2015-06-19 ENCOUNTER — Inpatient Hospital Stay (HOSPITAL_COMMUNITY)
Admission: RE | Admit: 2015-06-19 | Discharge: 2015-06-20 | DRG: 470 | Disposition: A | Discharge status: Home Health | Payer: Worker's Compensation | Source: Ambulatory Visit | Attending: Orthopedic Surgery | Admitting: Orthopedic Surgery

## 2015-06-19 ENCOUNTER — Encounter (HOSPITAL_COMMUNITY)
Admission: RE | Disposition: A | Discharge status: Home Health | Payer: Self-pay | Source: Ambulatory Visit | Attending: Orthopedic Surgery

## 2015-06-19 DIAGNOSIS — Z01812 Encounter for preprocedural laboratory examination: Secondary | ICD-10-CM | POA: Diagnosis not present

## 2015-06-19 DIAGNOSIS — M1712 Unilateral primary osteoarthritis, left knee: Secondary | ICD-10-CM | POA: Diagnosis present

## 2015-06-19 DIAGNOSIS — Z6831 Body mass index (BMI) 31.0-31.9, adult: Secondary | ICD-10-CM

## 2015-06-19 DIAGNOSIS — Z96652 Presence of left artificial knee joint: Secondary | ICD-10-CM

## 2015-06-19 DIAGNOSIS — Z87891 Personal history of nicotine dependence: Secondary | ICD-10-CM | POA: Diagnosis not present

## 2015-06-19 DIAGNOSIS — I10 Essential (primary) hypertension: Secondary | ICD-10-CM | POA: Diagnosis present

## 2015-06-19 DIAGNOSIS — M659 Synovitis and tenosynovitis, unspecified: Secondary | ICD-10-CM | POA: Diagnosis present

## 2015-06-19 DIAGNOSIS — F419 Anxiety disorder, unspecified: Secondary | ICD-10-CM | POA: Diagnosis present

## 2015-06-19 DIAGNOSIS — E669 Obesity, unspecified: Secondary | ICD-10-CM | POA: Diagnosis present

## 2015-06-19 DIAGNOSIS — M25562 Pain in left knee: Secondary | ICD-10-CM | POA: Diagnosis present

## 2015-06-19 DIAGNOSIS — Z96659 Presence of unspecified artificial knee joint: Secondary | ICD-10-CM

## 2015-06-19 HISTORY — PX: TOTAL KNEE ARTHROPLASTY: SHX125

## 2015-06-19 LAB — TYPE AND SCREEN
ABO/RH(D): A POS
Antibody Screen: NEGATIVE

## 2015-06-19 SURGERY — ARTHROPLASTY, KNEE, TOTAL
Anesthesia: Spinal | Site: Knee | Laterality: Left

## 2015-06-19 MED ORDER — ALUM & MAG HYDROXIDE-SIMETH 200-200-20 MG/5ML PO SUSP: 30.0000 mL | ORAL | Status: DC | PRN

## 2015-06-19 MED ORDER — PROPOFOL 10 MG/ML IV BOLUS
INTRAVENOUS | Status: AC
  Filled 2015-06-19: qty 20

## 2015-06-19 MED ORDER — ONDANSETRON HCL 4 MG/2ML IJ SOLN
INTRAMUSCULAR | Status: AC
  Filled 2015-06-19: qty 2

## 2015-06-19 MED ORDER — KETOROLAC TROMETHAMINE 30 MG/ML IJ SOLN
INTRAMUSCULAR | Status: DC | PRN
  Administered 2015-06-19: 30 mg

## 2015-06-19 MED ORDER — DEXAMETHASONE SODIUM PHOSPHATE 10 MG/ML IJ SOLN
10.0000 mg | Freq: Once | INTRAMUSCULAR | Status: AC
  Administered 2015-06-19: 10 mg via INTRAVENOUS

## 2015-06-19 MED ORDER — METOPROLOL SUCCINATE ER 50 MG PO TB24
50.0000 mg | ORAL_TABLET | Freq: Every day | ORAL | Status: DC
  Administered 2015-06-20: 50 mg via ORAL
  Filled 2015-06-19: qty 1

## 2015-06-19 MED ORDER — HYDROMORPHONE HCL 1 MG/ML IJ SOLN
0.5000 mg | INTRAMUSCULAR | Status: DC | PRN
  Administered 2015-06-19: 2 mg via INTRAVENOUS
  Administered 2015-06-19 (×2): 1 mg via INTRAVENOUS
  Administered 2015-06-20: 2 mg via INTRAVENOUS
  Administered 2015-06-20: 1 mg via INTRAVENOUS
  Administered 2015-06-20: 2 mg via INTRAVENOUS
  Filled 2015-06-19: qty 2
  Filled 2015-06-19 (×2): qty 1
  Filled 2015-06-19: qty 2
  Filled 2015-06-19 (×3): qty 1

## 2015-06-19 MED ORDER — LACTATED RINGERS IV SOLN
INTRAVENOUS | Status: DC | PRN
  Administered 2015-06-19 (×2): via INTRAVENOUS

## 2015-06-19 MED ORDER — FENTANYL CITRATE (PF) 100 MCG/2ML IJ SOLN
INTRAMUSCULAR | Status: AC
  Filled 2015-06-19: qty 2

## 2015-06-19 MED ORDER — PROPOFOL 500 MG/50ML IV EMUL
INTRAVENOUS | Status: DC | PRN
  Administered 2015-06-19: 125 ug/kg/min via INTRAVENOUS

## 2015-06-19 MED ORDER — PHENYLEPHRINE HCL 10 MG/ML IJ SOLN
INTRAMUSCULAR | Status: DC | PRN
  Administered 2015-06-19 (×2): 80 ug via INTRAVENOUS

## 2015-06-19 MED ORDER — ONDANSETRON HCL 4 MG PO TABS
4.0000 mg | ORAL_TABLET | Freq: Four times a day (QID) | ORAL | Status: DC | PRN
Start: 2015-06-19 — End: 2015-06-20

## 2015-06-19 MED ORDER — ACETAMINOPHEN 500 MG PO TABS
1000.0000 mg | ORAL_TABLET | Freq: Three times a day (TID) | ORAL | Status: DC
  Administered 2015-06-19 – 2015-06-20 (×3): 1000 mg via ORAL
  Filled 2015-06-19 (×6): qty 2

## 2015-06-19 MED ORDER — CEFAZOLIN SODIUM-DEXTROSE 2-3 GM-% IV SOLR
INTRAVENOUS | Status: AC
  Filled 2015-06-19: qty 50

## 2015-06-19 MED ORDER — LACTATED RINGERS IV SOLN: INTRAVENOUS | Status: DC

## 2015-06-19 MED ORDER — MIDAZOLAM HCL 5 MG/5ML IJ SOLN
INTRAMUSCULAR | Status: DC | PRN
  Administered 2015-06-19 (×2): 2 mg via INTRAVENOUS

## 2015-06-19 MED ORDER — CHLORHEXIDINE GLUCONATE 4 % EX LIQD: 60.0000 mL | Freq: Once | CUTANEOUS | Status: DC

## 2015-06-19 MED ORDER — BUPIVACAINE-EPINEPHRINE (PF) 0.25% -1:200000 IJ SOLN
INTRAMUSCULAR | Status: AC
  Filled 2015-06-19: qty 30

## 2015-06-19 MED ORDER — CEFAZOLIN SODIUM-DEXTROSE 2-3 GM-% IV SOLR
2.0000 g | INTRAVENOUS | Status: AC
  Administered 2015-06-19: 2 g via INTRAVENOUS

## 2015-06-19 MED ORDER — METOCLOPRAMIDE HCL 5 MG/ML IJ SOLN: 5.0000 mg | Freq: Three times a day (TID) | INTRAMUSCULAR | Status: DC | PRN

## 2015-06-19 MED ORDER — PHENYLEPHRINE 40 MCG/ML (10ML) SYRINGE FOR IV PUSH (FOR BLOOD PRESSURE SUPPORT)
PREFILLED_SYRINGE | INTRAVENOUS | Status: AC
  Filled 2015-06-19: qty 10

## 2015-06-19 MED ORDER — OXYCODONE HCL 5 MG PO TABS
5.0000 mg | ORAL_TABLET | Freq: Once | ORAL | Status: AC
  Administered 2015-06-19: 5 mg via ORAL
  Filled 2015-06-19: qty 1

## 2015-06-19 MED ORDER — POLYETHYLENE GLYCOL 3350 17 G PO PACK
17.0000 g | PACK | Freq: Two times a day (BID) | ORAL | Status: DC
  Administered 2015-06-19 – 2015-06-20 (×2): 17 g via ORAL

## 2015-06-19 MED ORDER — ASPIRIN EC 325 MG PO TBEC
325.0000 mg | DELAYED_RELEASE_TABLET | Freq: Two times a day (BID) | ORAL | Status: DC
  Administered 2015-06-20: 325 mg via ORAL
  Filled 2015-06-19 (×3): qty 1

## 2015-06-19 MED ORDER — PROPOFOL 10 MG/ML IV BOLUS
INTRAVENOUS | Status: DC | PRN
  Administered 2015-06-19: 40 mg via INTRAVENOUS

## 2015-06-19 MED ORDER — BUPIVACAINE-EPINEPHRINE (PF) 0.25% -1:200000 IJ SOLN
INTRAMUSCULAR | Status: DC | PRN
  Administered 2015-06-19: 30 mL

## 2015-06-19 MED ORDER — HYDROMORPHONE HCL 2 MG PO TABS
2.0000 mg | ORAL_TABLET | ORAL | Status: DC
  Administered 2015-06-19: 4 mg via ORAL
  Administered 2015-06-20: 2 mg via ORAL
  Administered 2015-06-20 (×3): 4 mg via ORAL
  Filled 2015-06-19 (×4): qty 2
  Filled 2015-06-19: qty 1
  Filled 2015-06-19: qty 2

## 2015-06-19 MED ORDER — AMLODIPINE BESYLATE 5 MG PO TABS
5.0000 mg | ORAL_TABLET | Freq: Every day | ORAL | Status: DC
  Administered 2015-06-20: 5 mg via ORAL
  Filled 2015-06-19: qty 1

## 2015-06-19 MED ORDER — TRANEXAMIC ACID 1000 MG/10ML IV SOLN
1000.0000 mg | Freq: Once | INTRAVENOUS | Status: AC
  Administered 2015-06-19: 1000 mg via INTRAVENOUS
  Filled 2015-06-19: qty 10

## 2015-06-19 MED ORDER — MENTHOL 3 MG MT LOZG: 1.0000 | LOZENGE | OROMUCOSAL | Status: DC | PRN

## 2015-06-19 MED ORDER — CEFAZOLIN SODIUM-DEXTROSE 2-3 GM-% IV SOLR
2.0000 g | Freq: Four times a day (QID) | INTRAVENOUS | Status: AC
  Administered 2015-06-19 – 2015-06-20 (×2): 2 g via INTRAVENOUS
  Filled 2015-06-19 (×2): qty 50

## 2015-06-19 MED ORDER — DEXAMETHASONE SODIUM PHOSPHATE 10 MG/ML IJ SOLN
10.0000 mg | Freq: Once | INTRAMUSCULAR | Status: AC
  Administered 2015-06-20: 10 mg via INTRAVENOUS
  Filled 2015-06-19 (×2): qty 1

## 2015-06-19 MED ORDER — MIDAZOLAM HCL 2 MG/2ML IJ SOLN
INTRAMUSCULAR | Status: AC
  Filled 2015-06-19: qty 2

## 2015-06-19 MED ORDER — FERROUS SULFATE 325 (65 FE) MG PO TABS
325.0000 mg | ORAL_TABLET | Freq: Three times a day (TID) | ORAL | Status: DC
  Administered 2015-06-20: 325 mg via ORAL
  Filled 2015-06-19 (×4): qty 1

## 2015-06-19 MED ORDER — KETOROLAC TROMETHAMINE 30 MG/ML IJ SOLN
INTRAMUSCULAR | Status: AC
  Filled 2015-06-19: qty 1

## 2015-06-19 MED ORDER — DICLOFENAC EPOLAMINE 1.3 % TD PTCH
1.0000 | MEDICATED_PATCH | Freq: Two times a day (BID) | TRANSDERMAL | Status: DC
  Administered 2015-06-19: 1 via TRANSDERMAL
  Filled 2015-06-19 (×4): qty 1

## 2015-06-19 MED ORDER — ONDANSETRON HCL 4 MG/2ML IJ SOLN
INTRAMUSCULAR | Status: DC | PRN
  Administered 2015-06-19: 4 mg via INTRAVENOUS

## 2015-06-19 MED ORDER — SODIUM CHLORIDE 0.9 % IV SOLN
INTRAVENOUS | Status: DC
  Administered 2015-06-19: 21:00:00 via INTRAVENOUS
  Filled 2015-06-19 (×3): qty 1000

## 2015-06-19 MED ORDER — BISACODYL 10 MG RE SUPP
10.0000 mg | Freq: Every day | RECTAL | Status: DC | PRN
Start: 2015-06-19 — End: 2015-06-20

## 2015-06-19 MED ORDER — METHOCARBAMOL 1000 MG/10ML IJ SOLN
500.0000 mg | Freq: Four times a day (QID) | INTRAVENOUS | Status: DC | PRN
  Administered 2015-06-19: 500 mg via INTRAVENOUS
  Filled 2015-06-19 (×4): qty 5

## 2015-06-19 MED ORDER — ONDANSETRON HCL 4 MG/2ML IJ SOLN: 4.0000 mg | Freq: Four times a day (QID) | INTRAMUSCULAR | Status: DC | PRN

## 2015-06-19 MED ORDER — DEXAMETHASONE SODIUM PHOSPHATE 10 MG/ML IJ SOLN
INTRAMUSCULAR | Status: AC
  Filled 2015-06-19: qty 1

## 2015-06-19 MED ORDER — SODIUM CHLORIDE 0.9 % IJ SOLN
INTRAMUSCULAR | Status: AC
  Filled 2015-06-19: qty 50

## 2015-06-19 MED ORDER — ALPRAZOLAM 1 MG PO TABS
1.0000 mg | ORAL_TABLET | Freq: Three times a day (TID) | ORAL | Status: DC | PRN
  Administered 2015-06-20: 1 mg via ORAL
  Filled 2015-06-19 (×2): qty 1

## 2015-06-19 MED ORDER — FENTANYL CITRATE (PF) 250 MCG/5ML IJ SOLN
INTRAMUSCULAR | Status: DC | PRN
  Administered 2015-06-19 (×2): 50 ug via INTRAVENOUS
  Administered 2015-06-19: 100 ug via INTRAVENOUS

## 2015-06-19 MED ORDER — DOCUSATE SODIUM 100 MG PO CAPS
100.0000 mg | ORAL_CAPSULE | Freq: Two times a day (BID) | ORAL | Status: DC
  Administered 2015-06-19 – 2015-06-20 (×2): 100 mg via ORAL

## 2015-06-19 MED ORDER — OXYCODONE-ACETAMINOPHEN 5-325 MG PO TABS
1.0000 | ORAL_TABLET | Freq: Once | ORAL | Status: AC
  Administered 2015-06-19: 1 via ORAL
  Filled 2015-06-19: qty 1

## 2015-06-19 MED ORDER — PROPOFOL 10 MG/ML IV BOLUS
INTRAVENOUS | Status: AC
  Filled 2015-06-19: qty 40

## 2015-06-19 MED ORDER — METHOCARBAMOL 500 MG PO TABS
500.0000 mg | ORAL_TABLET | Freq: Four times a day (QID) | ORAL | Status: DC | PRN
  Administered 2015-06-20 (×2): 500 mg via ORAL
  Filled 2015-06-19 (×2): qty 1

## 2015-06-19 MED ORDER — SODIUM CHLORIDE 0.9 % IJ SOLN
INTRAMUSCULAR | Status: DC | PRN
  Administered 2015-06-19: 30 mL

## 2015-06-19 MED ORDER — MAGNESIUM CITRATE PO SOLN: 1.0000 | Freq: Once | ORAL | Status: DC | PRN

## 2015-06-19 MED ORDER — CELECOXIB 200 MG PO CAPS
200.0000 mg | ORAL_CAPSULE | Freq: Two times a day (BID) | ORAL | Status: DC
  Administered 2015-06-19: 200 mg via ORAL
  Filled 2015-06-19 (×3): qty 1

## 2015-06-19 MED ORDER — PHENOL 1.4 % MT LIQD
1.0000 | OROMUCOSAL | Status: DC | PRN
  Filled 2015-06-19: qty 177

## 2015-06-19 MED ORDER — DIPHENHYDRAMINE HCL 25 MG PO CAPS: 25.0000 mg | ORAL_CAPSULE | Freq: Four times a day (QID) | ORAL | Status: DC | PRN

## 2015-06-19 MED ORDER — METOCLOPRAMIDE HCL 10 MG PO TABS: 5.0000 mg | ORAL_TABLET | Freq: Three times a day (TID) | ORAL | Status: DC | PRN

## 2015-06-19 SURGICAL SUPPLY — 47 items
BAG DECANTER FOR FLEXI CONT (MISCELLANEOUS) IMPLANT
BAG SPEC THK2 15X12 ZIP CLS (MISCELLANEOUS)
BAG ZIPLOCK 12X15 (MISCELLANEOUS) IMPLANT
BANDAGE ELASTIC 6 VELCRO ST LF (GAUZE/BANDAGES/DRESSINGS) ×3 IMPLANT
BLADE SAW SGTL 13.0X1.19X90.0M (BLADE) ×3 IMPLANT
BOWL SMART MIX CTS (DISPOSABLE) ×3 IMPLANT
CAPT KNEE TOTAL 3 ATTUNE ×2 IMPLANT
CEMENT HV SMART SET (Cement) ×4 IMPLANT
CLOTH BEACON ORANGE TIMEOUT ST (SAFETY) ×3 IMPLANT
CUFF TOURN SGL QUICK 34 (TOURNIQUET CUFF) ×3
CUFF TRNQT CYL 34X4X40X1 (TOURNIQUET CUFF) ×1 IMPLANT
DECANTER SPIKE VIAL GLASS SM (MISCELLANEOUS) ×3 IMPLANT
DRAPE U-SHAPE 47X51 STRL (DRAPES) ×3 IMPLANT
DRSG AQUACEL AG ADV 3.5X10 (GAUZE/BANDAGES/DRESSINGS) ×3 IMPLANT
DURAPREP 26ML APPLICATOR (WOUND CARE) ×6 IMPLANT
ELECT REM PT RETURN 9FT ADLT (ELECTROSURGICAL) ×3
ELECTRODE REM PT RTRN 9FT ADLT (ELECTROSURGICAL) ×1 IMPLANT
GLOVE BIOGEL M 7.0 STRL (GLOVE) IMPLANT
GLOVE BIOGEL M STRL SZ7.5 (GLOVE) IMPLANT
GLOVE BIOGEL PI IND STRL 7.5 (GLOVE) ×1 IMPLANT
GLOVE BIOGEL PI IND STRL 8.5 (GLOVE) ×1 IMPLANT
GLOVE BIOGEL PI INDICATOR 7.5 (GLOVE) ×2
GLOVE BIOGEL PI INDICATOR 8.5 (GLOVE) ×2
GLOVE ECLIPSE 8.0 STRL XLNG CF (GLOVE) ×3 IMPLANT
GLOVE ORTHO TXT STRL SZ7.5 (GLOVE) ×6 IMPLANT
GOWN STRL REUS W/TWL LRG LVL3 (GOWN DISPOSABLE) ×3 IMPLANT
GOWN STRL REUS W/TWL XL LVL3 (GOWN DISPOSABLE) ×3 IMPLANT
HANDPIECE INTERPULSE COAX TIP (DISPOSABLE) ×3
LIQUID BAND (GAUZE/BANDAGES/DRESSINGS) ×3 IMPLANT
MANIFOLD NEPTUNE II (INSTRUMENTS) ×3 IMPLANT
PACK TOTAL KNEE CUSTOM (KITS) ×3 IMPLANT
POSITIONER SURGICAL ARM (MISCELLANEOUS) ×3 IMPLANT
SET HNDPC FAN SPRY TIP SCT (DISPOSABLE) ×1 IMPLANT
SET PAD KNEE POSITIONER (MISCELLANEOUS) ×3 IMPLANT
SUCTION FRAZIER 12FR DISP (SUCTIONS) ×1 IMPLANT
SUT MNCRL AB 4-0 PS2 18 (SUTURE) ×3 IMPLANT
SUT VIC AB 1 CT1 36 (SUTURE) ×3 IMPLANT
SUT VIC AB 2-0 CT1 27 (SUTURE) ×9
SUT VIC AB 2-0 CT1 TAPERPNT 27 (SUTURE) ×3 IMPLANT
SUT VLOC 180 0 24IN GS25 (SUTURE) ×3 IMPLANT
SYR 50ML LL SCALE MARK (SYRINGE) ×3 IMPLANT
TRAY CATH 16FR W/PLASTIC CATH (SET/KITS/TRAYS/PACK) ×2 IMPLANT
TRAY FOLEY W/METER SILVER 14FR (SET/KITS/TRAYS/PACK) ×1 IMPLANT
TRAY FOLEY W/METER SILVER 16FR (SET/KITS/TRAYS/PACK) ×1 IMPLANT
WATER STERILE IRR 1500ML POUR (IV SOLUTION) ×3 IMPLANT
WRAP KNEE MAXI GEL POST OP (GAUZE/BANDAGES/DRESSINGS) ×3 IMPLANT
YANKAUER SUCT BULB TIP 10FT TU (MISCELLANEOUS) ×3 IMPLANT

## 2015-06-19 NOTE — Anesthesia Procedure Notes (Signed)
Spinal Patient location during procedure: OR Start time: 06/19/2015 2:58 PM End time: 06/19/2015 3:02 PM Staffing Anesthesiologist: Lillia Abed Performed by: anesthesiologist  Preanesthetic Checklist Completed: patient identified, site marked, surgical consent, pre-op evaluation, timeout performed, IV checked, risks and benefits discussed and monitors and equipment checked Spinal Block Patient position: sitting Prep: Betadine Patient monitoring: heart rate, cardiac monitor, continuous pulse ox and blood pressure Approach: right paramedian Location: L3-4 Injection technique: single-shot Needle Needle type: Sprotte  Needle gauge: 24 G Needle length: 9 cm Needle insertion depth: 7 cm

## 2015-06-19 NOTE — Anesthesia Preprocedure Evaluation (Signed)
Anesthesia Evaluation  Patient identified by MRN, date of birth, ID band Patient awake    Reviewed: Allergy & Precautions, Patient's Chart, lab work & pertinent test results  Airway Mallampati: II  TM Distance: >3 FB Neck ROM: Full    Dental   Pulmonary former smoker,    Pulmonary exam normal        Cardiovascular hypertension, Pt. on medications Normal cardiovascular exam     Neuro/Psych Anxiety    GI/Hepatic   Endo/Other    Renal/GU      Musculoskeletal   Abdominal   Peds  Hematology   Anesthesia Other Findings   Reproductive/Obstetrics                             Anesthesia Physical Anesthesia Plan  ASA: III  Anesthesia Plan: Spinal   Post-op Pain Management:    Induction: Intravenous  Airway Management Planned: Natural Airway  Additional Equipment:   Intra-op Plan:   Post-operative Plan:   Informed Consent: I have reviewed the patients History and Physical, chart, labs and discussed the procedure including the risks, benefits and alternatives for the proposed anesthesia with the patient or authorized representative who has indicated his/her understanding and acceptance.     Plan Discussed with: CRNA and Surgeon  Anesthesia Plan Comments:         Anesthesia Quick Evaluation

## 2015-06-19 NOTE — Discharge Instructions (Signed)

## 2015-06-19 NOTE — Progress Notes (Signed)
Patient called stating he needed to come in early to receive IV pain medicines for his back. He injured his back and knee at the same time ( along w his shoulder). States he has taken all of his oral pain meds and i surgeyrys in dire pain. States back pain is not going to interfere with knee and does not feel need to go to ER. Told patient I would speak w anesthesia. Called Dr Conrad Garfield, anesthesiologist. Orders given to give patinet a po med w sip water upon arrival. Order entered.

## 2015-06-19 NOTE — Op Note (Signed)
NAME:  Vernon Frye                      MEDICAL RECORD NO.:  [id number]                             FACILITY:  [facility]      PHYSICIAN:  Pietro Cassis. Alvan Dame, M.D.  DATE OF BIRTH:  1960/11/29      DATE OF PROCEDURE:  06/19/2015                                     OPERATIVE REPORT         PREOPERATIVE DIAGNOSIS:  Left knee osteoarthritis.      POSTOPERATIVE DIAGNOSIS:  Left knee osteoarthritis.      FINDINGS:  The patient was noted to have complete loss of cartilage and   bone-on-bone arthritis with associated osteophytes in all three compartments of   the knee worse medially with a significant synovitis and associated effusion.      PROCEDURE:  Left total knee replacement.      COMPONENTS USED:  DePuy Attune rotating platform posterior stabilized knee   system, a size 7 femur, 7 tibia, size 7 mm AOX PS insert, and 41 anatomic patellar   button.      SURGEON:  Pietro Cassis. Alvan Dame, M.D.      ASSISTANT:  Nehemiah Massed, PA-C.      ANESTHESIA:  Spinal.      SPECIMENS:  None.      COMPLICATION:  None.      DRAINS:  One Hemovac.  EBL: <100cc      TOURNIQUET TIME:   Total Tourniquet Time Documented: Thigh (Left) - 34 minutes Total: Thigh (Left) - 34 minutes  .      The patient was stable to the recovery room.      INDICATION FOR PROCEDURE:  Vernon Frye is a 54 y.o. male patient of   mine.  The patient had been seen, evaluated, and treated conservatively in the   office with medication, activity modification, and injections.  The patient had   radiographic changes of bone-on-bone arthritis with endplate sclerosis and osteophytes noted.      The patient failed conservative measures including medication, injections, and activity modification, and at this point was ready for more definitive measures.   Based on the radiographic changes and failed conservative measures, the patient   decided to proceed with total knee replacement.  Risks of infection,   DVT, component failure,  need for revision surgery, postop course, and   expectations were all   discussed and reviewed.  Consent was obtained for benefit of pain   relief.      PROCEDURE IN DETAIL:  The patient was brought to the operative theater.   Once adequate anesthesia, preoperative antibiotics, 2 gm of Ancef, 1 gm of Tranexamic Acid, and 10 mg of Decadron administered, the patient was positioned supine with the left thigh tourniquet placed.  The  left lower extremity was prepped and draped in sterile fashion.  A time-   out was performed identifying the patient, planned procedure, and   extremity.      The left lower extremity was placed in the [facility] leg holder.  The leg was   exsanguinated, tourniquet elevated to 250 mmHg.  A midline incision was  made followed by median parapatellar arthrotomy.  Following initial   exposure, attention was first directed to the patella.  Precut   measurement was noted to be 26 mm.  I resected down to 14-15 mm and used a   41 patellar button to restore patellar height as well as cover the cut   surface.      The lug holes were drilled and a metal shim was placed to protect the   patella from retractors and saw blades.      At this point, attention was now directed to the femur.  The femoral   canal was opened with a drill, irrigated to try to prevent fat emboli.  An   intramedullary rod was passed at 5 degrees valgus, 9 mm of bone was   resected off the distal femur.  Following this resection, the tibia was   subluxated anteriorly.  Using the extramedullary guide, 2 mm of bone was resected off   the proximal medial tibia.  We confirmed the gap would be   stable medially and laterally with a size 6 mm insert as well as confirmed   the cut was perpendicular in the coronal plane, checking with an alignment rod.      Once this was done, I sized the femur to be a size size 7 in the anterior-   posterior dimension, chose a standard component based on medial and   lateral  dimension.  The size 7 rotation block was then pinned in   position anterior referenced using the C-clamp to set rotation.  The   anterior, posterior, and  chamfer cuts were made without difficulty nor   notching making certain that I was along the anterior cortex to help   with flexion gap stability.      The final box cut was made off the lateral aspect of distal femur.      At this point, the tibia was sized to be a size 7, the size 7 tray was   then pinned in position through the medial third of the tubercle,   drilled, and keel punched.  Trial reduction was now carried with a 7 femur,  7 tibia, a size 7 mm PS insert, and the 41 patella botton.  The knee was brought to   extension, full extension with good flexion stability with the patella   tracking through the trochlea without application of pressure.  Given   all these findings, the trial components removed.  Final components were   opened and cement was mixed.  The knee was irrigated with normal saline   solution and pulse lavage.  The synovial lining was   then injected with 30cc of 0.25% Marcaine with epinephrine and 1 cc of Toradol plus 30cc of NS for a   total of 61 cc.      The knee was irrigated.  Final implants were then cemented onto clean and   dried cut surfaces of bone with the knee brought to extension with a size 7 mm trial insert.      Once the cement had fully cured, the excess cement was removed   throughout the knee.  I confirmed I was satisfied with the range of   motion and stability, and the final size 7 mm PS AOX insert was chosen.  It was   placed into the knee.      The tourniquet had been let down at 34 minutes.  No significant   hemostasis required.  The   extensor mechanism was then reapproximated using #1 Vicryl and #0 V-lock sutures with the knee   in flexion.  The   remaining wound was closed with 2-0 Vicryl and running 4-0 Monocryl.   The knee was cleaned, dried, dressed sterilely using  Dermabond and   Aquacel dressing.  The patient was then   brought to recovery room in stable condition, tolerating the procedure   well.   Please note that Physician Assistant, Nehemiah Massed, PA-C, was present for the entirety of the case, and was utilized for pre-operative positioning, peri-operative retractor management, general facilitation of the procedure.  He was also utilized for primary wound closure at the end of the case.              Pietro Cassis Alvan Dame, M.D.    06/19/2015 4:31 PM

## 2015-06-19 NOTE — Interval H&P Note (Signed)
History and Physical Interval Note:  06/19/2015 1:50 PM  Vernon Frye  has presented today for surgery, with the diagnosis of LEFT KNEE OA  The various methods of treatment have been discussed with the patient and family. After consideration of risks, benefits and other options for treatment, the patient has consented to  Procedure(s): LEFT TOTAL KNEE ARTHROPLASTY (Left) as a surgical intervention .  The patient's history has been reviewed, patient examined, no change in status, stable for surgery.  I have reviewed the patient's chart and labs.  Questions were answered to the patient's satisfaction.     [name]

## 2015-06-19 NOTE — Anesthesia Postprocedure Evaluation (Signed)
Anesthesia Post Note  Patient: Vernon Frye  Procedure(s) Performed: Procedure(s) (LRB): LEFT TOTAL KNEE ARTHROPLASTY (Left)  Patient location during evaluation: PACU Anesthesia Type: Spinal and MAC Level of consciousness: awake and alert Pain management: pain level controlled Vital Signs Assessment: post-procedure vital signs reviewed and stable Respiratory status: spontaneous breathing and respiratory function stable Cardiovascular status: blood pressure returned to baseline and stable Postop Assessment: Spinal receding Anesthetic complications: no    Last Vitals:  Filed Vitals:   06/19/15 1745 06/19/15 1800  BP: 127/87 143/89  Pulse: 65 69  Temp:    Resp: 17 12    Last Pain:  Filed Vitals:   06/19/15 1805  PainSc: 10-Worst pain ever    LLE Motor Response: Responds to commands LLE Sensation: Numbness (due to spinal) RLE Motor Response: Responds to commands RLE Sensation: Numbness (due to spinal) L Sensory Level: L3-Anterior knee, lower leg R Sensory Level: L3-Anterior knee, lower leg  Alin Chavira DAVID

## 2015-06-19 NOTE — Transfer of Care (Signed)
Immediate Anesthesia Transfer of Care Note  Patient: Vernon Frye  Procedure(s) Performed: Procedure(s): LEFT TOTAL KNEE ARTHROPLASTY (Left)  Patient Location: PACU  Anesthesia Type:General  Level of Consciousness:  sedated, patient cooperative and responds to stimulation  Airway & Oxygen Therapy:Patient Spontanous Breathing and Patient connected to face mask oxgen  Post-op Assessment:  Report given to PACU RN and Post -op Vital signs reviewed and stable  Post vital signs:  Reviewed and stable  Last Vitals:  Filed Vitals:   06/19/15 1313  BP: 147/81  Pulse: 95  Temp: 36.7 C  Resp: 18    Complications: No apparent anesthesia complications

## 2015-06-20 ENCOUNTER — Encounter (HOSPITAL_COMMUNITY): Payer: Self-pay | Admitting: Orthopedic Surgery

## 2015-06-20 LAB — CBC
HCT: 38.5 % — ABNORMAL LOW (ref 39.0–52.0)
Hemoglobin: 13.4 g/dL (ref 13.0–17.0)
MCH: 31.8 pg (ref 26.0–34.0)
MCHC: 34.8 g/dL (ref 30.0–36.0)
MCV: 91.4 fL (ref 78.0–100.0)
PLATELETS: 236 10*3/uL (ref 150–400)
RBC: 4.21 MIL/uL — AB (ref 4.22–5.81)
RDW: 12.1 % (ref 11.5–15.5)
WBC: 15.9 10*3/uL — ABNORMAL HIGH (ref 4.0–10.5)

## 2015-06-20 LAB — BASIC METABOLIC PANEL
Anion gap: 7 (ref 5–15)
BUN: 19 mg/dL (ref 6–20)
CALCIUM: 8.8 mg/dL — AB (ref 8.9–10.3)
CO2: 25 mmol/L (ref 22–32)
CREATININE: 0.85 mg/dL (ref 0.61–1.24)
Chloride: 102 mmol/L (ref 101–111)
GFR calc Af Amer: >60 mL/min (ref >60)
GLUCOSE: 181 mg/dL — AB (ref 65–99)
Potassium: 4.4 mmol/L (ref 3.5–5.1)
Sodium: 134 mmol/L — ABNORMAL LOW (ref 135–145)

## 2015-06-20 MED ORDER — KETOROLAC TROMETHAMINE 15 MG/ML IJ SOLN
15.0000 mg | Freq: Four times a day (QID) | INTRAMUSCULAR | Status: DC
  Administered 2015-06-20: 15 mg via INTRAVENOUS
  Filled 2015-06-20: qty 1

## 2015-06-20 MED ORDER — METHOCARBAMOL 500 MG PO TABS: 500.0000 mg | ORAL_TABLET | Freq: Four times a day (QID) | ORAL | Status: DC | PRN

## 2015-06-20 MED ORDER — ASPIRIN 325 MG PO TBEC: 325.0000 mg | DELAYED_RELEASE_TABLET | Freq: Two times a day (BID) | ORAL | Status: AC

## 2015-06-20 MED ORDER — POLYETHYLENE GLYCOL 3350 17 G PO PACK: 17.0000 g | PACK | Freq: Two times a day (BID) | ORAL | Status: DC

## 2015-06-20 MED ORDER — DOCUSATE SODIUM 100 MG PO CAPS: 100.0000 mg | ORAL_CAPSULE | Freq: Two times a day (BID) | ORAL | Status: DC

## 2015-06-20 MED ORDER — ALPRAZOLAM 1 MG PO TABS: 1.0000 mg | ORAL_TABLET | Freq: Every day | ORAL | Status: AC | PRN

## 2015-06-20 MED ORDER — HYDROMORPHONE HCL 4 MG PO TABS: 4.0000 mg | ORAL_TABLET | ORAL | Status: DC | PRN

## 2015-06-20 MED ORDER — ACETAMINOPHEN 500 MG PO TABS: 1000.0000 mg | ORAL_TABLET | Freq: Three times a day (TID) | ORAL | Status: DC

## 2015-06-20 MED ORDER — FERROUS SULFATE 325 (65 FE) MG PO TABS: 325.0000 mg | ORAL_TABLET | Freq: Three times a day (TID) | ORAL | Status: DC

## 2015-06-20 NOTE — Progress Notes (Signed)
Physical Therapy Treatment Patient Details Name: Vernon Frye MRN: [id number] DOB: November 05, 1960 Today's Date: 06/20/2015    History of Present Illness L TKR    PT Comments    Pt progressing well with mobility and eager for dc home.  Follow Up Recommendations  Home health PT     Equipment Recommendations  Rolling walker with 5" wheels    Recommendations for Other Services OT consult     Precautions / Restrictions Precautions Precautions: Knee;Fall Restrictions Weight Bearing Restrictions: No Other Position/Activity Restrictions: WBAT    Mobility  Bed Mobility Overal bed mobility: Needs Assistance Bed Mobility: Supine to Sit;Sit to Supine     Supine to sit: Modified independent (Device/Increase time) Sit to supine: Modified independent (Device/Increase time)   General bed mobility comments: hob raised  Transfers Overall transfer level: Needs assistance Equipment used: Rolling walker (2 wheeled) Transfers: Sit to/from Stand Sit to Stand: Supervision         General transfer comment: cues for UE placement  Ambulation/Gait Ambulation/Gait assistance: Min guard;Supervision Ambulation Distance (Feet): 100 Feet Assistive device: Rolling walker (2 wheeled) Gait Pattern/deviations: Step-to pattern;Step-through pattern;Decreased step length - right;Decreased step length - left;Shuffle;Trunk flexed Gait velocity: cues to slow down for saftey   General Gait Details: cues for posture, sequence and position from RW   Stairs Stairs: Yes Stairs assistance: Min guard Stair Management: Two rails;Forwards;Step to pattern Number of Stairs: 5 General stair comments: cues for sequence and foot placement  Wheelchair Mobility    Modified Rankin (Stroke Patients Only)       Balance                                    Cognition Arousal/Alertness: Awake/alert Behavior During Therapy: WFL for tasks assessed/performed Overall Cognitive Status:  Within Functional Limits for tasks assessed                      Exercises Total Joint Exercises Ankle Circles/Pumps: AROM;Both;15 reps;Supine Quad Sets: AROM;Both;10 reps;Supine Heel Slides: AAROM;Left;Supine;10 reps Straight Leg Raises: AAROM;AROM;Left;10 reps;Supine    General Comments        Pertinent Vitals/Pain Pain Assessment: 0-10 Pain Score: 7  Pain Location: L knee Pain Descriptors / Indicators: Aching;Sore Pain Intervention(s): Limited activity within patient's tolerance;Monitored during session;Premedicated before session;Ice applied    Home Living Family/patient expects to be discharged to:: Private residence Living Arrangements: Alone Available Help at Discharge: Friend(s) Type of Home: House       Home Equipment: Crutches      Prior Function Level of Independence: Independent          PT Goals (current goals can now be found in the care plan section) Acute Rehab PT Goals Patient Stated Goal: Regain IND with decreased pain PT Goal Formulation: With patient Time For Goal Achievement: 06/22/15 Potential to Achieve Goals: Good Progress towards PT goals: Progressing toward goals    Frequency  7X/week    PT Plan Current plan remains appropriate    Co-evaluation             End of Session Equipment Utilized During Treatment: Gait belt Activity Tolerance: Patient tolerated treatment well Patient left: in bed;with call bell/phone within reach     Time: 1230-1301 PT Time Calculation (min) (ACUTE ONLY): 31 min  Charges:  $Gait Training: 8-22 mins $Therapeutic Exercise: 8-22 mins  G Codes:      Vernon Frye 07/07/2015, 1:48 PM

## 2015-06-20 NOTE — Evaluation (Signed)
Physical Therapy Evaluation Patient Details Name: Vernon Frye MRN: [id number] DOB: 05-27-1961 Today's Date: 06/20/2015   History of Present Illness  L TKR  Clinical Impression  Pt s/p L TKR presents with decreased L LE strength/ROM and post op pain limiting functional mobility.  Pt should progress well to dc home with assist of friends and HHPT follow up.    Follow Up Recommendations Home health PT    Equipment Recommendations  Rolling walker with 5" wheels    Recommendations for Other Services OT consult     Precautions / Restrictions Precautions Precautions: Knee;Fall Restrictions Weight Bearing Restrictions: No Other Position/Activity Restrictions: WBAT      Mobility  Bed Mobility Overal bed mobility: Needs Assistance Bed Mobility: Supine to Sit     Supine to sit: Min guard     General bed mobility comments: min cues for sequence and use of R LE to self assist  Transfers Overall transfer level: Needs assistance Equipment used: Rolling walker (2 wheeled) Transfers: Sit to/from Stand Sit to Stand: Min guard;Supervision         General transfer comment: cues for LE management and use of UEs to self assist  Ambulation/Gait Ambulation/Gait assistance: Min guard Ambulation Distance (Feet): 75 Feet Assistive device: Rolling walker (2 wheeled) Gait Pattern/deviations: Step-to pattern;Decreased step length - right;Decreased step length - left;Shuffle;Trunk flexed Gait velocity: cues to slow down for saftey   General Gait Details: cues for posture, sequence and position from [employer]            Wheelchair Mobility    Modified Rankin (Stroke Patients Only)       Balance                                             Pertinent Vitals/Pain Pain Assessment: 0-10 Pain Score: 8  Pain Location: L knee Pain Descriptors / Indicators: Aching;Sore Pain Intervention(s): Limited activity within patient's tolerance;Monitored during  session;Premedicated before session;Ice applied    Home Living Family/patient expects to be discharged to:: Private residence Living Arrangements: Alone Available Help at Discharge: Friend(s) Type of Home: House Home Access: Stairs to enter Entrance Stairs-Rails: Right;Left;Can reach both Technical brewer of Steps: 2 Home Layout: One level Home Equipment: Crutches      Prior Function Level of Independence: Independent               Hand Dominance        Extremity/Trunk Assessment   Upper Extremity Assessment: Overall WFL for tasks assessed           Lower Extremity Assessment: LLE deficits/detail   LLE Deficits / Details: 3-/5 quads with AAROM at knee -10 - 90  Cervical / Trunk Assessment: Normal  Communication   Communication: No difficulties  Cognition Arousal/Alertness: Awake/alert Behavior During Therapy: WFL for tasks assessed/performed Overall Cognitive Status: Within Functional Limits for tasks assessed                      General Comments      Exercises Total Joint Exercises Ankle Circles/Pumps: AROM;Both;15 reps;Supine Quad Sets: AROM;Both;10 reps;Supine Heel Slides: AAROM;Left;15 reps;Supine Straight Leg Raises: AAROM;AROM;Left;10 reps;Supine      Assessment/Plan    PT Assessment Patient needs continued PT services  PT Diagnosis Difficulty walking   PT Problem List Decreased strength;Decreased range of motion;Decreased activity tolerance;Decreased mobility;Decreased knowledge of use  of DME;Pain  PT Treatment Interventions DME instruction;Gait training;Stair training;Functional mobility training;Therapeutic activities;Therapeutic exercise;Patient/family education   PT Goals (Current goals can be found in the Care Plan section) Acute Rehab PT Goals Patient Stated Goal: Regain IND with decreased pain PT Goal Formulation: With patient Time For Goal Achievement: 06/22/15 Potential to Achieve Goals: Good    Frequency  7X/week   Barriers to discharge        Co-evaluation               End of Session Equipment Utilized During Treatment: Gait belt Activity Tolerance: Patient tolerated treatment well Patient left: in chair;with call bell/phone within reach Nurse Communication: Mobility status         Time: 0851-0920 PT Time Calculation (min) (ACUTE ONLY): 29 min   Charges:   PT Evaluation $Initial PT Evaluation Tier I: 1 Procedure PT Treatments $Therapeutic Exercise: 8-22 mins   PT G Codes:        Ander Wamser 2015/07/13, 12:11 PM

## 2015-06-20 NOTE — Evaluation (Signed)
Occupational Therapy Evaluation Patient Details Name: Vernon Frye MRN: [id number] DOB: 07/25/1961 Today's Date: 06/20/2015    History of Present Illness L TKR   Clinical Impression   This 54 year old man was admitted for the above surgery. All education was completed.  No further OT is needed at this time    Follow Up Recommendations  No OT follow up    Equipment Recommendations  None recommended by OT    Recommendations for Other Services       Precautions / Restrictions Precautions Precautions: Knee;Fall Restrictions Weight Bearing Restrictions: No Other Position/Activity Restrictions: WBAT      Mobility Bed Mobility Overal bed mobility: Needs Assistance Bed Mobility: Supine to Sit     Supine to sit: Modified independent (Device/Increase time)     General bed mobility comments: hob raised  Transfers Overall transfer level: Needs assistance Equipment used: Rolling walker (2 wheeled) Transfers: Sit to/from Stand Sit to Stand: Supervision         General transfer comment: cues for UE placement    Balance                                            ADL Overall ADL's : Needs assistance/impaired                         Toilet Transfer: Supervision/safety;Ambulation;Comfort height toilet             General ADL Comments: ambulated to bathroom with supervision.  Pt states his commode is higher than our comfort height; put 3:1 over this (added 2") and pt did not use armrests.  he has a tub/shower and will sponge bathe for a couple of days, if needed.  He will have someone in the house and sit on commode to wash feet.  Pt needs occasional min A for LB dressing.       Vision     Perception     Praxis      Pertinent Vitals/Pain Pain Assessment: 0-10 Pain Score: 8  Pain Location: L knee Pain Descriptors / Indicators: Aching Pain Intervention(s): Limited activity within patient's tolerance;Monitored during  session;Premedicated before session;Repositioned;Ice applied     Hand Dominance     Extremity/Trunk Assessment Upper Extremity Assessment Upper Extremity Assessment: Overall WFL for tasks assessed      Cervical / Trunk Assessment Cervical / Trunk Assessment: Normal   Communication Communication Communication: No difficulties   Cognition Arousal/Alertness: Awake/alert Behavior During Therapy: WFL for tasks assessed/performed Overall Cognitive Status: Within Functional Limits for tasks assessed                     General Comments       Exercises      Shoulder Instructions      Home Living Family/patient expects to be discharged to:: Private residence Living Arrangements: Alone Available Help at Discharge: Friend(s) Type of Home: House Home Access: Stairs to enter [employer] of Steps: 2 Entrance Stairs-Rails: Right;Left;Can reach both Home Layout: One level     Bathroom Shower/Tub: Tub/shower unit Shower/tub characteristics: Architectural technologist: Handicapped height     Home Equipment: Crutches          Prior Functioning/Environment Level of Independence: Independent             OT Diagnosis: Acute pain   OT  Problem List:     OT Treatment/Interventions:      OT Goals(Current goals can be found in the care plan section) Acute Rehab OT Goals Patient Stated Goal: Regain IND with decreased pain  OT Frequency:     Barriers to D/C:            Co-evaluation              End of Session    Activity Tolerance: Patient tolerated treatment well Patient left: in bed;with call bell/phone within reach   Time: 1149-1207 OT Time Calculation (min): 18 min Charges:  OT General Charges $OT Visit: 1 Procedure OT Evaluation $Initial OT Evaluation Tier I: 1 Procedure G-Codes:    Luca Burston 2015-07-07, 12:32 PM   Lesle Chris, OTR/L [phone] 2015/07/07

## 2015-06-20 NOTE — Progress Notes (Signed)
Spoke with pt who referred me to his Workers Social worker. [employer] number was found on web, [phone].  Pt did not give me a telephone number but was able to give me CM names. [employer] was called spoke Darelene, CM was on vacation, Erick Blinks, CM was on vacation. Spoke with Taffy Dunn, CM who transferred me to Heide Guile [phone]. Pt was approved for DME from [facility] [id number]. Curt Bears with [employer] states that she will call for [facility] and contact the pt with this information. This information was related to Amy, RN pt's nurse. Pt was ready to leave.

## 2015-06-20 NOTE — Progress Notes (Signed)
     Subjective: 1 Day Post-Op Procedure(s) (LRB): LEFT TOTAL KNEE ARTHROPLASTY (Left)   Patient reports pain as moderate, controlled with the medication.  No events throughout the night.  Some concerns with his care this morning. States that he wants to be discharged home.  Objective:   VITALS:   Filed Vitals:   06/20/15 0203 06/20/15 0600  BP: 140/87 146/90  Pulse: 76 66  Temp: 97.7 F (36.5 C) 98 F (36.7 C)  Resp: 16 16    Dorsiflexion/Plantar flexion intact Incision: dressing C/D/I No cellulitis present Compartment soft  LABS  Recent Labs  06/20/15 0420  HGB 13.4  HCT 38.5*  WBC 15.9*  PLT 236     Recent Labs  06/20/15 0420  NA 134*  K 4.4  BUN 19  CREATININE 0.85  GLUCOSE 181*     Assessment/Plan: 1 Day Post-Op Procedure(s) (LRB): LEFT TOTAL KNEE ARTHROPLASTY (Left) Foley cath d/c'ed Advance diet Up with therapy D/C IV fluids Discharge home with home health  Follow up in 2 weeks at [facility]. Follow up with OLIN,Kodah Maret D in 2 weeks.  Contact information:  [facility] [address], [place] [id number]    Obese (BMI 30-39.9) Estimated body mass index is 31.4 kg/(m^2) as calculated from the following:   Height as of this encounter: 6\' 3"  (1.905 m).   Weight as of this encounter: 113.966 kg (251 lb 4 oz). Patient also counseled that weight may inhibit the healing process Patient counseled that losing weight will help with future health issues        West Pugh. Muhannad Bignell   PAC  06/20/2015, 9:36 AM

## 2015-06-25 NOTE — Discharge Summary (Signed)
Physician Discharge Summary  Patient ID: Vernon Frye MRN: [id number] DOB/AGE: May 09, 1961 54 y.o.  Admit date: 06/19/2015 Discharge date: 06/20/2015   Procedures:  Procedure(s) (LRB): LEFT TOTAL KNEE ARTHROPLASTY (Left)  Attending Physician:  Dr. Paralee Cancel   Admission Diagnoses:   Left knee primary OA / pain  Discharge Diagnoses:  Principal Problem:   S/P left TKA Active Problems:   S/P knee replacement  Past Medical History  Diagnosis Date  . Hypertension     takes Toprol,Prinizide,and Amlodipine daily  . Arthritis   . Joint pain   . Joint swelling   . Chronic back pain     DDD  . Night muscle spasms     takes Soma daily  . History of kidney stones   . Anxiety     takes Xanax daily  . Complication of anesthesia     agitated when awaking   . Numbness and tingling     hands and feet   . Tinnitus   . Laceration     history of to right lower extermity   . H/O rotator cuff tear   . Pneumonia     history of     HPI:    Vernon Frye, 54 y.o. male, has a history of pain and functional disability in the left knee due to trauma and arthritis and has failed non-surgical conservative treatments for greater than 12 weeks to include NSAID's and/or analgesics, corticosteriod injections, viscosupplementation injections, use of assistive devices and activity modification. Onset of symptoms was gradual, starting 05/10/2013 with gradually worsening course since that time. The patient noted prior procedures on the knee to include arthroscopy on the left knee(s). Patient currently rates pain in the left knee(s) at 10 out of 10 with activity. Patient has night pain, worsening of pain with activity and weight bearing, pain that interferes with activities of daily living, pain with passive range of motion, crepitus and joint swelling. Patient has evidence of periarticular osteophytes and joint space narrowing by imaging studies. There is no active infection. Risks,  benefits and expectations were discussed with the patient. Risks including but not limited to the risk of anesthesia, blood clots, nerve damage, blood vessel damage, failure of the prosthesis, infection and up to and including death. Patient understand the risks, benefits and expectations and wishes to proceed with surgery.   PCP: No PCP Per Patient   Discharged Condition: good  Hospital Course:  Patient underwent the above stated procedure on 06/19/2015. Patient tolerated the procedure well and brought to the recovery room in good condition and subsequently to the floor.  POD #1 BP: 146/90 ; Pulse: 66 ; Temp: 98 F (36.7 C) ; Resp: 16 Patient reports pain as moderate, controlled with the medication. No events throughout the night. Some concerns with his care this morning. States that he wants to be discharged home. Dorsiflexion/plantar flexion intact, incision: dressing C/D/I, no cellulitis present and compartment soft.   LABS  Basename    HGB     13.4  HCT     38.5    Discharge Exam: General appearance: alert, cooperative and no distress Extremities: Homans sign is negative, no sign of DVT, no edema, redness or tenderness in the calves or thighs and no ulcers, gangrene or trophic changes  Disposition: Home with follow up in 2 weeks   Follow-up Information    Follow up with [name], MD. Schedule an appointment as soon as possible for a visit in 2 weeks.  Specialty:  Orthopedic Surgery   Contact information:   [address] [place] [address] [id number]       Discharge Instructions    Call MD / Call 911    Complete by:  As directed   If you experience chest pain or shortness of breath, CALL 911 and be transported to the hospital emergency room.  If you develope a fever above 101 F, pus (white drainage) or increased drainage or redness at the wound, or calf pain, call your surgeon's office.     Change dressing    Complete by:  As directed    Maintain surgical dressing until follow up in the clinic. If the edges start to pull up, may reinforce with tape. If the dressing is no longer working, may remove and cover with gauze and tape, but must keep the area dry and clean.  Call with any questions or concerns.     Constipation Prevention    Complete by:  As directed   Drink plenty of fluids.  Prune juice may be helpful.  You may use a stool softener, such as Colace (over the counter) 100 mg twice a day.  Use MiraLax (over the counter) for constipation as needed.     Diet - low sodium heart healthy    Complete by:  As directed      Discharge instructions    Complete by:  As directed   Maintain surgical dressing until follow up in the clinic. If the edges start to pull up, may reinforce with tape. If the dressing is no longer working, may remove and cover with gauze and tape, but must keep the area dry and clean.  Follow up in 2 weeks at [facility]. Call with any questions or concerns.     Increase activity slowly as tolerated    Complete by:  As directed   Weight bearing as tolerated with assist device (walker, cane, etc) as directed, use it as long as suggested by your surgeon or therapist, typically at least 4-6 weeks.     TED hose    Complete by:  As directed   Use stockings (TED hose) for 2 weeks on both leg(s).  You may remove them at night for sleeping.             Medication List    STOP taking these medications        naproxen 500 MG tablet  Commonly known as:  NAPROSYN     oxyCODONE-acetaminophen 10-325 MG tablet  Commonly known as:  PERCOCET      TAKE these medications        acetaminophen 500 MG tablet  Commonly known as:  TYLENOL  Take 2 tablets (1,000 mg total) by mouth every 8 (eight) hours.     ALPRAZolam 1 MG tablet  Commonly known as:  XANAX  Take 1 tablet (1 mg total) by mouth daily as needed for anxiety or sleep.     amLODipine 5 MG tablet  Commonly known as:  NORVASC  Take 5 mg by  mouth daily.     aspirin 325 MG EC tablet  Take 1 tablet (325 mg total) by mouth 2 (two) times daily.     docusate sodium 100 MG capsule  Commonly known as:  COLACE  Take 1 capsule (100 mg total) by mouth 2 (two) times daily.     ferrous sulfate 325 (65 FE) MG tablet  Take 1 tablet (325 mg total) by mouth 3 (  three) times daily after meals.     FLECTOR 1.3 % Ptch  Generic drug:  diclofenac  APPLY 1 PATCH TO AFFECTED AREA FOR 12 HOURS A DAY     HYDROmorphone 4 MG tablet  Commonly known as:  DILAUDID  Take 1-2 tablets (4-8 mg total) by mouth every 4 (four) hours as needed for severe pain.     lidocaine 5 %  Commonly known as:  LIDODERM  APPLY TO AFFECTED AREA 12 HOURS PER DAY     lisinopril-hydrochlorothiazide 20-12.5 MG tablet  Commonly known as:  PRINZIDE,ZESTORETIC  Take 1 tablet by mouth 2 (two) times daily.     methocarbamol 500 MG tablet  Commonly known as:  ROBAXIN  Take 1 tablet (500 mg total) by mouth every 6 (six) hours as needed for muscle spasms.     metoprolol succinate 50 MG 24 hr tablet  Commonly known as:  TOPROL-XL  Take 50 mg by mouth daily. Take with or immediately following a meal.     polyethylene glycol packet  Commonly known as:  MIRALAX / GLYCOLAX  Take 17 g by mouth 2 (two) times daily.         Signed: [place] Pugh. Victorious Kundinger   PA-C  06/25/2015, 9:26 AM

## 2015-06-29 ENCOUNTER — Emergency Department (HOSPITAL_COMMUNITY)
Admission: EM | Admit: 2015-06-29 | Discharge: 2015-06-29 | Disposition: A | Discharge status: Home / Self Care | Payer: Worker's Compensation | Attending: Emergency Medicine | Admitting: Emergency Medicine

## 2015-06-29 ENCOUNTER — Emergency Department (HOSPITAL_COMMUNITY): Payer: Worker's Compensation

## 2015-06-29 ENCOUNTER — Ambulatory Visit (HOSPITAL_BASED_OUTPATIENT_CLINIC_OR_DEPARTMENT_OTHER)
Admit: 2015-06-29 | Discharge: 2015-06-29 | Disposition: A | Discharge status: Home / Self Care | Payer: Worker's Compensation | Attending: Emergency Medicine | Admitting: Emergency Medicine

## 2015-06-29 ENCOUNTER — Encounter (HOSPITAL_COMMUNITY): Payer: Self-pay

## 2015-06-29 DIAGNOSIS — I10 Essential (primary) hypertension: Secondary | ICD-10-CM | POA: Insufficient documentation

## 2015-06-29 DIAGNOSIS — Y9389 Activity, other specified: Secondary | ICD-10-CM | POA: Diagnosis not present

## 2015-06-29 DIAGNOSIS — Z87891 Personal history of nicotine dependence: Secondary | ICD-10-CM | POA: Diagnosis not present

## 2015-06-29 DIAGNOSIS — S86812A Strain of other muscle(s) and tendon(s) at lower leg level, left leg, initial encounter: Secondary | ICD-10-CM | POA: Insufficient documentation

## 2015-06-29 DIAGNOSIS — F419 Anxiety disorder, unspecified: Secondary | ICD-10-CM | POA: Diagnosis not present

## 2015-06-29 DIAGNOSIS — M1990 Unspecified osteoarthritis, unspecified site: Secondary | ICD-10-CM | POA: Insufficient documentation

## 2015-06-29 DIAGNOSIS — Z7982 Long term (current) use of aspirin: Secondary | ICD-10-CM | POA: Insufficient documentation

## 2015-06-29 DIAGNOSIS — Z9889 Other specified postprocedural states: Secondary | ICD-10-CM | POA: Insufficient documentation

## 2015-06-29 DIAGNOSIS — X58XXXA Exposure to other specified factors, initial encounter: Secondary | ICD-10-CM | POA: Diagnosis not present

## 2015-06-29 DIAGNOSIS — G8929 Other chronic pain: Secondary | ICD-10-CM | POA: Insufficient documentation

## 2015-06-29 DIAGNOSIS — Y92009 Unspecified place in unspecified non-institutional (private) residence as the place of occurrence of the external cause: Secondary | ICD-10-CM | POA: Insufficient documentation

## 2015-06-29 DIAGNOSIS — Z8701 Personal history of pneumonia (recurrent): Secondary | ICD-10-CM | POA: Insufficient documentation

## 2015-06-29 DIAGNOSIS — M25562 Pain in left knee: Secondary | ICD-10-CM | POA: Diagnosis present

## 2015-06-29 DIAGNOSIS — R52 Pain, unspecified: Secondary | ICD-10-CM

## 2015-06-29 DIAGNOSIS — S86112A Strain of other muscle(s) and tendon(s) of posterior muscle group at lower leg level, left leg, initial encounter: Secondary | ICD-10-CM

## 2015-06-29 DIAGNOSIS — Z87442 Personal history of urinary calculi: Secondary | ICD-10-CM | POA: Diagnosis not present

## 2015-06-29 DIAGNOSIS — Z79899 Other long term (current) drug therapy: Secondary | ICD-10-CM | POA: Diagnosis not present

## 2015-06-29 DIAGNOSIS — Y998 Other external cause status: Secondary | ICD-10-CM | POA: Diagnosis not present

## 2015-06-29 DIAGNOSIS — M79609 Pain in unspecified limb: Secondary | ICD-10-CM

## 2015-06-29 DIAGNOSIS — M7989 Other specified soft tissue disorders: Secondary | ICD-10-CM

## 2015-06-29 MED ORDER — HYDROMORPHONE HCL 2 MG/ML IJ SOLN
2.0000 mg | Freq: Once | INTRAMUSCULAR | Status: AC
  Administered 2015-06-29: 2 mg via INTRAMUSCULAR
  Filled 2015-06-29: qty 1

## 2015-06-29 NOTE — ED Notes (Signed)
Vascular US at bedside.

## 2015-06-29 NOTE — ED Notes (Signed)
Per EMS, pt from home. Pt recently had left knee surgery on 11/22.  Pt states he went to sit down this morning.  Felt a pop in his knee and has had pain in knee with swelling since then.  Vitals: 117/40, 94% ra, resp 16, hr 101

## 2015-06-29 NOTE — ED Provider Notes (Addendum)
CSN: [id number]     Arrival date & time 06/29/15  0913 History   First MD Initiated Contact with Patient 06/29/15 1000     Chief Complaint  Patient presents with  . Knee Pain     (Consider location/radiation/quality/duration/timing/severity/associated sxs/prior Treatment) HPI Comments: Patient is a 54 year old male with recent knee surgery approximately one week ago who presents today with severe pain and swelling in the proximal calf. He states directly after surgery he was having some pain in his proximal left calf which improved over the last 4 days. He has been able to range his knee to 90 without difficulty and has even been walking some without his walker when yesterday he bends his knee greater than 90 and went to sit down and suddenly developed a severe pain in the proximal left calf. This morning the pain has continued with severe pain when attempting to walk or flex the foot. The calf is tender, swollen and ecchymotic which is new. He denies any pain in the knee or problems with bending the knee. No chest pain or shortness of breath. No fevers or drainage.  The history is provided by the patient.    Past Medical History  Diagnosis Date  . Hypertension     takes Toprol,Prinizide,and Amlodipine daily  . Arthritis   . Joint pain   . Joint swelling   . Chronic back pain     DDD  . Night muscle spasms     takes Soma daily  . History of kidney stones   . Anxiety     takes Xanax daily  . Complication of anesthesia     agitated when awaking   . Numbness and tingling     hands and feet   . Tinnitus   . Laceration     history of to right lower extermity   . H/O rotator cuff tear   . Pneumonia     history of    Past Surgical History  Procedure Laterality Date  . Lithotripsy    . Hernia repair Bilateral as a baby    inguinal-  . Shoulder arthroscopy with subacromial decompression and open rotator c Left 01/16/2014    Procedure: LEFT SHOULDER ARTHROSCOPY WITH SUBACROMIAL  DECOMPRESSION AND MINI OPEN ROTATOR CUFF REPAIR, OPEN DISTAL CLAVICLE RESECTION AND TENDODESIS;  Surgeon: Augustin Schooling, MD;  Location: [facility];  Service: Orthopedics;  Laterality: Left;  . Tens unit      for severe pain   . Total knee arthroplasty Left 06/19/2015    Procedure: LEFT TOTAL KNEE ARTHROPLASTY;  Surgeon: Paralee Cancel, MD;  Location: [facility];  Service: Orthopedics;  Laterality: Left;   History reviewed. No pertinent family history. Social History  Substance Use Topics  . Smoking status: Former Smoker -- 0.25 packs/day for 7 years    Types: Cigarettes    Quit date: 07/28/2004  . Smokeless tobacco: Never Used     Comment: quit smoking 8 yrs ago  . Alcohol Use: Yes     Comment: 3 beers daily    Review of Systems  All other systems reviewed and are negative.     Allergies  Review of patient's allergies indicates no known allergies.  Home Medications   Prior to Admission medications   Medication Sig Start Date End Date Taking? Authorizing Provider  acetaminophen (TYLENOL) 500 MG tablet Take 2 tablets (1,000 mg total) by mouth every 8 (eight) hours. 06/20/15  Yes Danae Orleans, PA-C  ALPRAZolam Duanne Moron) 1 MG tablet Take  1 tablet (1 mg total) by mouth daily as needed for anxiety or sleep. Patient taking differently: Take 0.5-1 mg by mouth daily as needed for anxiety or sleep.  06/20/15  Yes Matthew Babish, PA-C  amLODipine (NORVASC) 5 MG tablet Take 5 mg by mouth daily.   Yes Historical Provider, MD  aspirin EC 325 MG EC tablet Take 1 tablet (325 mg total) by mouth 2 (two) times daily. 06/20/15 07/18/15 Yes Matthew Babish, PA-C  docusate sodium (COLACE) 100 MG capsule Take 1 capsule (100 mg total) by mouth 2 (two) times daily. Patient taking differently: Take 100 mg by mouth 3 (three) times daily as needed for moderate constipation.  06/20/15  Yes Matthew Babish, PA-C  FLECTOR 1.3 % PTCH APPLY 1 PATCH TO AFFECTED AREA FOR 12 HOURS A DAY as needed for pain 05/22/15  Yes  Historical Provider, MD  lidocaine (LIDODERM) 5 % APPLY TO AFFECTED AREA 12 HOURS PER DAY as needed for pain 05/22/15  Yes Historical Provider, MD  lisinopril-hydrochlorothiazide (PRINZIDE,ZESTORETIC) 20-12.5 MG per tablet Take 2 tablets by mouth daily.    Yes Historical Provider, MD  methocarbamol (ROBAXIN) 500 MG tablet Take 1 tablet (500 mg total) by mouth every 6 (six) hours as needed for muscle spasms. 06/20/15  Yes Danae Orleans, PA-C  metoprolol succinate (TOPROL-XL) 50 MG 24 hr tablet Take 50 mg by mouth daily. Take with or immediately following a meal.   Yes Historical Provider, MD  naproxen (NAPROSYN) 500 MG tablet Take 500 mg by mouth 2 (two) times daily as needed. pain 05/22/15  Yes Historical Provider, MD  Oxycodone HCl 10 MG TABS Take 1-3 tablets by mouth every 4 (four) hours as needed. pain 06/26/15  Yes Historical Provider, MD  oxyCODONE-acetaminophen (PERCOCET) 10-325 MG tablet Take 1 tablet by mouth every 6 (six) hours as needed. pain 06/07/15  Yes Historical Provider, MD  ferrous sulfate 325 (65 FE) MG tablet Take 1 tablet (325 mg total) by mouth 3 (three) times daily after meals. Patient not taking: Reported on 06/29/2015 06/20/15   Danae Orleans, PA-C  HYDROmorphone (DILAUDID) 4 MG tablet Take 1-2 tablets (4-8 mg total) by mouth every 4 (four) hours as needed for severe pain. Patient not taking: Reported on 06/29/2015 06/20/15   Danae Orleans, PA-C  polyethylene glycol (MIRALAX / Floria Raveling) packet Take 17 g by mouth 2 (two) times daily. Patient not taking: Reported on 06/29/2015 06/20/15   Danae Orleans, PA-C   BP 137/97 mmHg  Pulse 99  Temp(Src) 97.5 F (36.4 C) (Oral)  Resp 16  SpO2 100% Physical Exam  Constitutional: He is oriented to person, place, and time. He appears well-developed and well-nourished. No distress.  HENT:  Head: Normocephalic and atraumatic.  Eyes: EOM are normal. Pupils are equal, round, and reactive to light.  Cardiovascular: Normal rate.     Pulmonary/Chest: Effort normal.  Musculoskeletal:       Left lower leg: He exhibits tenderness and swelling.       Legs: Neurological: He is alert and oriented to person, place, and time.  Skin: Skin is warm and dry.  Psychiatric: He has a normal mood and affect. His behavior is normal.  Nursing note and vitals reviewed.   ED Course  Procedures (including critical care time) Labs Review Labs Reviewed - No data to display  Imaging Review Dg Knee Complete 4 Views Left  06/29/2015  ADDENDUM REPORT: 06/29/2015 10:39 ADDENDUM: These results were called by telephone at the time of interpretation on 06/29/2015 at 10:39  am to Dr. Blanchie Dessert , who verbally acknowledged these results. The patient reportedly had recent knee surgery which could account for much if not all of the findings discussed above. In particular, soft tissue air can be explained by postoperative etiology as opposed to infection. Electronically Signed   By: Lowella Grip III M.D.   On: 06/29/2015 10:39  06/29/2015  CLINICAL DATA:  Pain and bruising.  No recent trauma by report EXAM: LEFT KNEE - COMPLETE 4+ VIEW COMPARISON:  None. FINDINGS: Frontal, lateral, and bilateral oblique views were obtained. There is prepatellar soft tissue swelling with sizable joint effusion. Patient is status post total knee replacement with femoral and tibial prosthetic components appearing well-seated. No acute fracture or dislocation. No erosive change or bony destruction. Soft tissues appear edematous with apparent areas of soft tissue air in the soft tissues. IMPRESSION: Sizable joint effusion with extensive prepatellar soft tissue swelling. Question hemorrhage. Infection is a differential consideration for this appearance. Soft tissues appear edematous with areas concerning for soft tissue air. Soft tissue infection must be of concern given this appearance, particularly given reported absence of trauma. No bony destruction. Prosthetic  components appear well seated. Electronically Signed: By: Lowella Grip III M.D. On: 06/29/2015 10:32   I have personally reviewed and evaluated these images and lab results as part of my medical decision-making.   EKG Interpretation None      Left lower extremity venous duplex completed.   Preliminary report: Left: No evidence of DVT, superficial thrombosis, or Baker's cyst. Hypoechoic area in the popliteal fossa. Large hypoechoic area in the calf consistent with muscle tear.  MDM   Final diagnoses:  Gastrocnemius muscle tear, left, initial encounter    Patient presenting with severe proximal calf pain swelling and bruising that started yesterday. Patient recently had knee surgery and was doing well when he bends his knee farther than 85 yesterday and sat down which produced a severe pain in his proximal left calf. Since that time has been unable to put any weight on his foot without severe calf pain. He states just after surgery he had some proximal left calf pain that had slowly gone away over the last 4 days. Now he has severe pain with flexion of the left. He is unable to bear weight. He is still able to flex the knee to 90 and has no evidence of foot drop.  Low suspicion for any infection at this time. Question possible tendon laceration or tear given the sudden nature of the pain and after greater than 90 of flexion. Refill is less than 3 seconds and palpable DP pulse. X-ray shows a questionable hemorrhage with soft tissue swelling and some air which is most likely related to his recent surgery. Ultrasound ordered to evaluate to rule out DVT and patient given pain control.  11:54 AM Patient's ultrasound is consistent with a large hypoechoic area consistent with a muscle tear which is most consistent with his story. Patient will continue to do ice, elevation, compression and follow-up with [name]. No evidence of compartment syndrome at this time.  Blanchie Dessert,  MD 06/29/15 [place], MD 06/29/15 1158

## 2015-06-29 NOTE — ED Notes (Signed)
AVS explained in detail. Knows to keep appointment with MD Alvan Dame on Thursday and to follow post-surgical instructions closely. A&Ox4. Given ice pack and AVS. No other c/c.

## 2015-06-29 NOTE — ED Notes (Signed)
Pt refused xray per xray tech.

## 2015-06-29 NOTE — Progress Notes (Signed)
VASCULAR LAB PRELIMINARY  PRELIMINARY  PRELIMINARY  PRELIMINARY  Left lower extremity venous duplex completed.    Preliminary report:  Left:  No evidence of DVT, superficial thrombosis, or Baker's cyst.  Hypoechoic area in the popliteal fossa.  Large hypoechoic area in the calf consistent with muscle tear.  Kamillah Didonato, RVT 06/29/2015, 11:25 AM

## 2018-09-16 ENCOUNTER — Emergency Department (HOSPITAL_COMMUNITY)
Admission: EM | Admit: 2018-09-16 | Discharge: 2018-09-16 | Disposition: A | Discharge status: Home / Self Care | Payer: Self-pay | Attending: Emergency Medicine | Admitting: Emergency Medicine

## 2018-09-16 ENCOUNTER — Emergency Department (HOSPITAL_COMMUNITY): Payer: Self-pay

## 2018-09-16 ENCOUNTER — Encounter (HOSPITAL_COMMUNITY): Payer: Self-pay

## 2018-09-16 ENCOUNTER — Other Ambulatory Visit: Payer: Self-pay

## 2018-09-16 DIAGNOSIS — Z79899 Other long term (current) drug therapy: Secondary | ICD-10-CM | POA: Insufficient documentation

## 2018-09-16 DIAGNOSIS — Z7901 Long term (current) use of anticoagulants: Secondary | ICD-10-CM | POA: Insufficient documentation

## 2018-09-16 DIAGNOSIS — I4891 Unspecified atrial fibrillation: Secondary | ICD-10-CM | POA: Insufficient documentation

## 2018-09-16 DIAGNOSIS — M25512 Pain in left shoulder: Secondary | ICD-10-CM | POA: Insufficient documentation

## 2018-09-16 DIAGNOSIS — I10 Essential (primary) hypertension: Secondary | ICD-10-CM | POA: Insufficient documentation

## 2018-09-16 DIAGNOSIS — H539 Unspecified visual disturbance: Secondary | ICD-10-CM | POA: Insufficient documentation

## 2018-09-16 DIAGNOSIS — Z96652 Presence of left artificial knee joint: Secondary | ICD-10-CM | POA: Insufficient documentation

## 2018-09-16 DIAGNOSIS — Z87891 Personal history of nicotine dependence: Secondary | ICD-10-CM | POA: Insufficient documentation

## 2018-09-16 LAB — CBC
HCT: 46.9 % (ref 39.0–52.0)
Hemoglobin: 15.4 g/dL (ref 13.0–17.0)
MCH: 30.1 pg (ref 26.0–34.0)
MCHC: 32.8 g/dL (ref 30.0–36.0)
MCV: 91.6 fL (ref 80.0–100.0)
NRBC: 0 % (ref 0.0–0.2)
PLATELETS: 275 10*3/uL (ref 150–400)
RBC: 5.12 MIL/uL (ref 4.22–5.81)
RDW: 12.1 % (ref 11.5–15.5)
WBC: 9.4 10*3/uL (ref 4.0–10.5)

## 2018-09-16 LAB — I-STAT TROPONIN, ED: TROPONIN I, POC: 0.01 ng/mL (ref 0.00–0.08)

## 2018-09-16 LAB — BASIC METABOLIC PANEL
Anion gap: 8 (ref 5–15)
BUN: 22 mg/dL — ABNORMAL HIGH (ref 6–20)
CALCIUM: 9.8 mg/dL (ref 8.9–10.3)
CHLORIDE: 101 mmol/L (ref 98–111)
CO2: 27 mmol/L (ref 22–32)
Creatinine, Ser: 1.16 mg/dL (ref 0.61–1.24)
Glucose, Bld: 116 mg/dL — ABNORMAL HIGH (ref 70–99)
Potassium: 4.1 mmol/L (ref 3.5–5.1)
SODIUM: 136 mmol/L (ref 135–145)

## 2018-09-16 LAB — TSH: TSH: 2.142 u[IU]/mL (ref 0.350–4.500)

## 2018-09-16 MED ORDER — SODIUM CHLORIDE 0.9% FLUSH: 3.0000 mL | Freq: Once | INTRAVENOUS | Status: DC

## 2018-09-16 MED ORDER — METOPROLOL SUCCINATE ER 50 MG PO TB24: 150.0000 mg | ORAL_TABLET | Freq: Every day | ORAL | 0 refills | Status: DC

## 2018-09-16 MED ORDER — APIXABAN 5 MG PO TABS: 5.0000 mg | ORAL_TABLET | Freq: Two times a day (BID) | ORAL | 0 refills | Status: DC

## 2018-09-16 NOTE — ED Notes (Addendum)
Coming from [facility]-MD states came to see him for left shoulder pain-states patient complaining of chest tightness, blurred vision, and difficulty seeing out of one eye-MD doesn't think it is related to shoulder pain-states new onset of A-fib

## 2018-09-16 NOTE — ED Provider Notes (Signed)
[facility] DEPT Provider Note   CSN: [id number] Arrival date & time: 09/16/18  1012    History   Chief Complaint Chief Complaint  Patient presents with  . Chest Pain    HPI Vernon Frye is a 58 y.o. male.     HPI Patient presents with chest tightness.  Also vision change.  For the last around 4 days has had blurred vision in the lower part of his left eye.  States that he saw an ophthalmologist with that said the eye was okay but that he needed a CT scan to look for a stroke.  However has felt his heart fluttering.  Is been going on for a week or 2 but worse the last few days.  States he went to see his primary doctor today and was told he was in A. fib.  Has had some chest tightness.  No swelling in his legs.  He is on chronic pain medicine. Past Medical History:  Diagnosis Date  . Anxiety    takes Xanax daily  . Arthritis   . Chronic back pain    DDD  . Complication of anesthesia    agitated when awaking   . H/O rotator cuff tear   . History of kidney stones   . Hypertension    takes Toprol,Prinizide,and Amlodipine daily  . Joint pain   . Joint swelling   . Laceration    history of to right lower extermity   . Night muscle spasms    takes Soma daily  . Numbness and tingling    hands and feet   . Pneumonia    history of   . Tinnitus     Patient Active Problem List   Diagnosis Date Noted  . 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 S/P knee replacement 06/19/2015  . Rotator cuff tear 01/16/2014  . HYPERTENSION 01/31/2010  . PNEUMONIA 01/31/2010  . SWELLING, MASS, OR LUMP IN CHEST 01/31/2010    Past Surgical History:  Procedure Laterality Date  . HERNIA REPAIR Bilateral as a baby   inguinal-  . LITHOTRIPSY    . SHOULDER ARTHROSCOPY WITH SUBACROMIAL DECOMPRESSION AND OPEN ROTATOR C Left 01/16/2014   Procedure: LEFT SHOULDER ARTHROSCOPY WITH SUBACROMIAL DECOMPRESSION AND MINI OPEN ROTATOR CUFF REPAIR, OPEN DISTAL CLAVICLE RESECTION AND  TENDODESIS;  Surgeon: Augustin Schooling, MD;  Location: [facility];  Service: Orthopedics;  Laterality: Left;  . tens unit     for severe pain   . TOTAL KNEE ARTHROPLASTY Left 06/19/2015   Procedure: LEFT TOTAL KNEE ARTHROPLASTY;  Surgeon: Paralee Cancel, MD;  Location: [facility];  Service: Orthopedics;  Laterality: Left;        Home Medications    Prior to Admission medications   Medication Sig Start Date End Date Taking? Authorizing Provider  ALPRAZolam Duanne Moron) 1 MG tablet Take 1 tablet (1 mg total) by mouth daily as needed for anxiety or sleep. Patient taking differently: Take 0.5-1 mg by mouth 3 (three) times daily as needed for anxiety or sleep.  06/20/15  Yes Babish, Rodman Key, PA-C  diazePAM (VALIUM PO) Take 1 tablet by mouth once. Pt states he took a valium he had 'stashed' on the morning of 09/16/2018   Yes [provider]  diclofenac sodium (VOLTAREN) 1 % GEL Apply 1 application topically 4 (four) times daily. 09/07/18  Yes [provider]  ibuprofen (ADVIL,MOTRIN) 200 MG tablet Take 400 mg by mouth every 6 (six) hours as needed for headache or mild  pain.   Yes [provider]  lisinopril-hydrochlorothiazide (PRINZIDE,ZESTORETIC) 20-12.5 MG per tablet Take 2 tablets by mouth daily.    Yes [provider]  morphine (MS CONTIN) 30 MG 12 hr tablet Take 30 mg by mouth every 8 (eight) hours. 09/09/18  Yes [provider]  naproxen (NAPROSYN) 500 MG tablet Take 500 mg by mouth 2 (two) times daily as needed. pain 05/22/15  Yes [provider]  oxyCODONE-acetaminophen (PERCOCET) 10-325 MG tablet Take 1 tablet by mouth every 6 (six) hours as needed. pain 06/07/15  Yes [provider]  polyvinyl alcohol (LIQUIFILM TEARS) 1.4 % ophthalmic solution Place 1 drop into both eyes as needed for dry eyes.   Yes [provider]  psyllium (HYDROCIL/METAMUCIL) 95 % PACK Take 1 packet by mouth daily as needed for mild constipation.   Yes [provider]  acetaminophen (TYLENOL) 500 MG tablet Take 2 tablets (1,000 mg total) by mouth every 8 (eight) hours. Patient not taking: Reported on 09/16/2018 06/20/15   Danae Orleans, PA-C  apixaban (ELIQUIS) 5 MG TABS tablet Take 1 tablet (5 mg total) by mouth 2 (two) times daily for 30 days. 09/16/18 10/16/18  Davonna Belling, MD  docusate sodium (COLACE) 100 MG capsule Take 1 capsule (100 mg total) by mouth 2 (two) times daily. Patient not taking: Reported on 09/16/2018 06/20/15   Danae Orleans, PA-C  ferrous sulfate 325 (65 FE) MG tablet Take 1 tablet (325 mg total) by mouth 3 (three) times daily after meals. Patient not taking: Reported on 06/29/2015 06/20/15   Danae Orleans, PA-C  HYDROmorphone (DILAUDID) 4 MG tablet Take 1-2 tablets (4-8 mg total) by mouth every 4 (four) hours as needed for severe pain. Patient not taking: Reported on 06/29/2015 06/20/15   Danae Orleans, PA-C  methocarbamol (ROBAXIN) 500 MG tablet Take 1 tablet (500 mg total) by mouth every 6 (six) hours as needed for muscle spasms. Patient not taking: Reported on 09/16/2018 06/20/15   Danae Orleans, PA-C  metoprolol succinate (TOPROL-XL) 50 MG 24 hr tablet Take 3 tablets (150 mg total) by mouth daily. 09/16/18   Davonna Belling, MD  polyethylene glycol [facility] / Floria Raveling) packet Take 17 g by mouth 2 (two) times daily. Patient not taking: Reported on 06/29/2015 06/20/15   Danae Orleans, PA-C    Family History History reviewed. No pertinent family history.  Social History Social History   Tobacco Use  . Smoking status: Former Smoker    Packs/day: 0.25    Years: 7.00    Pack years: 1.75    Types: Cigarettes    Last attempt to quit: 07/28/2004    Years since quitting: 14.1  . Smokeless tobacco: Never Used  . Tobacco comment: quit smoking 8 yrs ago  Substance Use Topics  . Alcohol use: Yes    Comment: 3 beers daily  . Drug use: No     Allergies   Patient has no known allergies.   Review of  Systems Review of Systems  Constitutional: Negative for appetite change and fever.  HENT: Negative for congestion.   Eyes: Positive for visual disturbance.  Respiratory: Negative for shortness of breath.   Cardiovascular: Positive for chest pain and palpitations.  Genitourinary: Negative for flank pain.  Musculoskeletal: Negative for back pain.  Skin: Negative for rash.  Neurological: Negative for weakness.  Psychiatric/Behavioral: Negative for confusion.     Physical Exam Updated Vital Signs BP (!) 173/112 (BP Location: Right Arm)   Pulse (!) 105   Temp  98.8 F (37.1 C) (Oral)   Resp 20   Ht 6\' 3"  (1.905 m)   Wt 117.9 kg   SpO2 100%   BMI 32.50 kg/m   Physical Exam Eyes:     Extraocular Movements: Extraocular movements intact.  Cardiovascular:     Rate and Rhythm: Normal rate. Rhythm irregular.  Pulmonary:     Breath sounds: Normal breath sounds.  Chest:     Chest wall: No tenderness.  Abdominal:     Tenderness: There is no abdominal tenderness.  Musculoskeletal:     Right lower leg: No edema.     Left lower leg: No edema.  Skin:    General: Skin is warm.     Capillary Refill: Capillary refill takes less than 2 seconds.  Neurological:     Mental Status: He is alert.     Comments: Good grip strength bilaterally.  Moving all extremities.  Face symmetric.  Eye movements intact.  Visual fields grossly intact to confrontation but states vision on the lower visual field on the left eye is like he is looking through wax paper.      ED Treatments / Results  Labs (all labs ordered are listed, but only abnormal results are displayed) Labs Reviewed  BASIC METABOLIC PANEL - Abnormal; Notable for the following components:      Result Value   Glucose, Bld 116 (*)    BUN 22 (*)    All other components within normal limits  CBC  TSH  I-STAT TROPONIN, ED    EKG EKG Interpretation  Date/Time:  Thursday September 16 2018 10:22:42 EST Ventricular Rate:  110 PR  Interval:    QRS Duration: 93 QT Interval:  350 QTC Calculation: 474 R Axis:   74 Text Interpretation:  Atrial fibrillation Confirmed by Davonna Belling [phone]) on 09/16/2018 10:32:03 AM Also confirmed by Davonna Belling [phone])  on 09/16/2018 10:32:38 AM   Radiology Dg Chest 2 View  Result Date: 09/16/2018 CLINICAL DATA:  Chest pain and left shoulder pain.  Ex-smoker. EXAM: CHEST - 2 VIEW COMPARISON:  01/16/2014 FINDINGS: Moderate lower thoracic spondylosis. Mild right hemidiaphragm elevation. Midline trachea. Mild cardiomegaly. No pleural effusion or pneumothorax. No congestive failure. Clear lungs. IMPRESSION: Mild cardiomegaly, without acute disease. Electronically Signed   By: Abigail Miyamoto M.D.   On: 09/16/2018 11:04   Mr Jodene Nam Head Wo Contrast  Result Date: 09/16/2018 CLINICAL DATA:  Atrial fibrillation. Chest tightness. Blurred vision of the LEFT eye. EXAM: MRI HEAD WITHOUT CONTRAST MRA HEAD WITHOUT CONTRAST TECHNIQUE: Multiplanar, multiecho pulse sequences of the brain and surrounding structures were obtained without intravenous contrast. Angiographic images of the head were obtained using MRA technique without contrast. COMPARISON:  No prior CT examinations. FINDINGS: MRI HEAD FINDINGS Brain: There is a tiny focus of restricted diffusion in the LEFT posterior frontal cortex near the vertex, seen only on axial images. This could not be confirmed on coronal DWI. While it is possible that this is a tiny infarct from the patient's atrial fibrillation, it is of doubtful significance No hemorrhage, mass lesion, hydrocephalus, or extra-axial fluid. Slight premature for age atrophy. Mild subcortical white matter signal abnormality, likely early chronic microvascular ischemic change. That is subtle area of T2 and FLAIR hyperintensity in a RIGHT frontal gyrus, could represent a chronic ischemic insult. Tiny chronic lacunar infarct, posterior lentiform nucleus on the RIGHT. Linear areas of T1 shortening  lie within the roof of the third ventricle in the midline. 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  Vascular: Reported separately Skull and upper cervical spine: No worrisome findings. Sinuses/Orbits: No significant sinus opacity.  Negative orbits. Other: None. MRA HEAD FINDINGS Internal carotid arteries are widely patent. Basilar artery is widely patent. Vertebral arteries are codominant. No intracranial stenosis or aneurysm. IMPRESSION: Premature for age atrophy.  Mild small vessel disease. Punctate focus of restricted diffusion LEFT posterior frontal cortex only seen on axial imaging. This is of uncertain/doubtful clinical significance given lack of correlation with coronal DWI. 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 Consider CT head without contrast for confirmation. MRA intracranial shows no areas of stenosis or occlusion. Electronically Signed   By: Staci Righter M.D.   On: 09/16/2018 13:50   Mr Brain Wo Contrast  Result Date: 09/16/2018 CLINICAL DATA:  Atrial fibrillation. Chest tightness. Blurred vision of the LEFT eye. EXAM: MRI HEAD WITHOUT CONTRAST MRA HEAD WITHOUT CONTRAST TECHNIQUE: Multiplanar, multiecho pulse sequences of the brain and surrounding structures were obtained without intravenous contrast. Angiographic images of the head were obtained using MRA technique without contrast. COMPARISON:  No prior CT examinations. FINDINGS: MRI HEAD FINDINGS Brain: There is a tiny focus of restricted diffusion in the LEFT posterior frontal cortex near the vertex, seen only on axial images. This could not be confirmed on coronal DWI. While it is possible that this is a tiny infarct from the patient's atrial fibrillation, it is of doubtful significance No hemorrhage, mass lesion, hydrocephalus, or extra-axial fluid. Slight premature for age atrophy. Mild subcortical white matter signal abnormality, likely early chronic microvascular  ischemic change. That is subtle area of T2 and FLAIR hyperintensity in a RIGHT frontal gyrus, could represent a chronic ischemic insult. Tiny chronic lacunar infarct, posterior lentiform nucleus on the RIGHT. Linear areas of T1 shortening lie within the roof of the third ventricle in the midline. 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 Vascular: Reported separately Skull and upper cervical spine: No worrisome findings. Sinuses/Orbits: No significant sinus opacity.  Negative orbits. Other: None. MRA HEAD FINDINGS Internal carotid arteries are widely patent. Basilar artery is widely patent. Vertebral arteries are codominant. No intracranial stenosis or aneurysm. IMPRESSION: Premature for age atrophy.  Mild small vessel disease. Punctate focus of restricted diffusion LEFT posterior frontal cortex only seen on axial imaging. This is of uncertain/doubtful clinical significance given lack of correlation with coronal DWI. 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 Consider CT head without contrast for confirmation. MRA intracranial shows no areas of stenosis or occlusion. Electronically Signed   By: Staci Righter M.D.   On: 09/16/2018 13:50    Procedures Procedures (including critical care time)  Medications Ordered in ED Medications  sodium chloride flush (NS) 0.9 % injection 3 mL (has no administration in time range)     Initial Impression / Assessment and Plan / ED Course  I have reviewed the triage vital signs and the nursing notes.  Pertinent labs & imaging results that were available during my care of the patient were reviewed by me and considered in my medical decision making (see chart for details).       Chads vas score of either 1 for hypertension or 3 if this vision change is from a thromboembolism.   Patient with chest pain.  New onset atrial fibrillation.  Mildly increased heart rate but overall mostly controlled.   Will increase metoprolol.  Had visual change.  MRI done and reassuring.  1 slight area  of questionable significance on MRI.  Will give anticoagulation.  Patient eager to leave and will discharge home for outpatient follow-up.  He took his results with him to take back to his ophthalmologist.  Will have follow-up with the A. fib clinic.  Final Clinical Impressions(s) / ED Diagnoses   Final diagnoses:  Atrial fibrillation, unspecified type [facility])  Vision changes    ED Discharge Orders         Ordered    metoprolol succinate (TOPROL-XL) 50 MG 24 hr tablet  Daily     09/16/18 1421    apixaban (ELIQUIS) 5 MG TABS tablet  2 times daily     09/16/18 1421           Davonna Belling, MD 09/16/18 1520

## 2018-09-16 NOTE — ED Notes (Signed)
Pt had removed all monitoring devices and IV.

## 2018-09-16 NOTE — ED Notes (Signed)
Pt is alert and oriented x 4 and is verbally responsive. Pt is resting calmly in bed at this time. Pt appears in no distress reports chest pain 2/10 and SOB. Pt is 100% on RA and RR rate at 13. Cardiac monitor in place.

## 2018-09-16 NOTE — Discharge Instructions (Signed)
Follow-up with atrial fibrillation.  Follow-up with your eye doctor.  Decrease your NSAIDs due to possible reaction with the blood thinner.

## 2018-09-16 NOTE — ED Notes (Signed)
Bed: WA02 Expected date:  Expected time:  Means of arrival:  Comments: [place] [facility] [place]

## 2018-09-16 NOTE — ED Triage Notes (Addendum)
Patient c/o left chest tightness and when the patient went to his physician patient was in atrial fib.Patient c/o chest tightness. Patient states that he is unsure when chest started . Patient states he has pain from a left shoulder injury/surgery. Patient also c/o blurred vision of the left eye.

## 2018-09-17 ENCOUNTER — Telehealth (HOSPITAL_COMMUNITY): Payer: Self-pay | Admitting: *Deleted

## 2018-09-17 NOTE — Telephone Encounter (Signed)
Attempted to contact patient x3 with no answer and no vcml.  Pt has no vcml set up.  Will try to contact again later.  Pt seen in ED and started on anticoag.

## 2019-07-12 ENCOUNTER — Other Ambulatory Visit: Payer: Self-pay

## 2019-07-12 ENCOUNTER — Inpatient Hospital Stay (HOSPITAL_COMMUNITY)
Admission: EM | Admit: 2019-07-12 | Discharge: 2019-09-07 | DRG: 466 | Disposition: A | Discharge status: Home Health | Payer: Self-pay | Attending: Family Medicine | Admitting: Family Medicine

## 2019-07-12 ENCOUNTER — Encounter (HOSPITAL_COMMUNITY): Payer: Self-pay | Admitting: Emergency Medicine

## 2019-07-12 ENCOUNTER — Emergency Department (HOSPITAL_COMMUNITY): Payer: Self-pay

## 2019-07-12 DIAGNOSIS — R52 Pain, unspecified: Secondary | ICD-10-CM

## 2019-07-12 DIAGNOSIS — Z8249 Family history of ischemic heart disease and other diseases of the circulatory system: Secondary | ICD-10-CM

## 2019-07-12 DIAGNOSIS — D509 Iron deficiency anemia, unspecified: Secondary | ICD-10-CM | POA: Diagnosis present

## 2019-07-12 DIAGNOSIS — Z781 Physical restraint status: Secondary | ICD-10-CM

## 2019-07-12 DIAGNOSIS — A4102 Sepsis due to Methicillin resistant Staphylococcus aureus: Secondary | ICD-10-CM | POA: Diagnosis present

## 2019-07-12 DIAGNOSIS — M21372 Foot drop, left foot: Secondary | ICD-10-CM | POA: Diagnosis not present

## 2019-07-12 DIAGNOSIS — L271 Localized skin eruption due to drugs and medicaments taken internally: Secondary | ICD-10-CM | POA: Diagnosis not present

## 2019-07-12 DIAGNOSIS — Z79899 Other long term (current) drug therapy: Secondary | ICD-10-CM

## 2019-07-12 DIAGNOSIS — E440 Moderate protein-calorie malnutrition: Secondary | ICD-10-CM | POA: Diagnosis not present

## 2019-07-12 DIAGNOSIS — L270 Generalized skin eruption due to drugs and medicaments taken internally: Secondary | ICD-10-CM

## 2019-07-12 DIAGNOSIS — G9341 Metabolic encephalopathy: Secondary | ICD-10-CM

## 2019-07-12 DIAGNOSIS — F10239 Alcohol dependence with withdrawal, unspecified: Secondary | ICD-10-CM | POA: Diagnosis not present

## 2019-07-12 DIAGNOSIS — R0902 Hypoxemia: Secondary | ICD-10-CM

## 2019-07-12 DIAGNOSIS — T8450XA Infection and inflammatory reaction due to unspecified internal joint prosthesis, initial encounter: Secondary | ICD-10-CM | POA: Diagnosis present

## 2019-07-12 DIAGNOSIS — I4891 Unspecified atrial fibrillation: Secondary | ICD-10-CM | POA: Diagnosis present

## 2019-07-12 DIAGNOSIS — M25562 Pain in left knee: Secondary | ICD-10-CM

## 2019-07-12 DIAGNOSIS — Z79891 Long term (current) use of opiate analgesic: Secondary | ICD-10-CM

## 2019-07-12 DIAGNOSIS — R0682 Tachypnea, not elsewhere classified: Secondary | ICD-10-CM

## 2019-07-12 DIAGNOSIS — T8454XA Infection and inflammatory reaction due to internal left knee prosthesis, initial encounter: Principal | ICD-10-CM | POA: Diagnosis present

## 2019-07-12 DIAGNOSIS — Z9181 History of falling: Secondary | ICD-10-CM

## 2019-07-12 DIAGNOSIS — M7989 Other specified soft tissue disorders: Secondary | ICD-10-CM | POA: Diagnosis not present

## 2019-07-12 DIAGNOSIS — E669 Obesity, unspecified: Secondary | ICD-10-CM | POA: Diagnosis present

## 2019-07-12 DIAGNOSIS — R609 Edema, unspecified: Secondary | ICD-10-CM

## 2019-07-12 DIAGNOSIS — R197 Diarrhea, unspecified: Secondary | ICD-10-CM | POA: Diagnosis not present

## 2019-07-12 DIAGNOSIS — F1510 Other stimulant abuse, uncomplicated: Secondary | ICD-10-CM | POA: Diagnosis present

## 2019-07-12 DIAGNOSIS — N136 Pyonephrosis: Secondary | ICD-10-CM | POA: Diagnosis not present

## 2019-07-12 DIAGNOSIS — G92 Toxic encephalopathy: Secondary | ICD-10-CM | POA: Diagnosis not present

## 2019-07-12 DIAGNOSIS — F419 Anxiety disorder, unspecified: Secondary | ICD-10-CM | POA: Diagnosis present

## 2019-07-12 DIAGNOSIS — K140 Glossitis: Secondary | ICD-10-CM | POA: Diagnosis present

## 2019-07-12 DIAGNOSIS — Z96659 Presence of unspecified artificial knee joint: Secondary | ICD-10-CM

## 2019-07-12 DIAGNOSIS — Z598 Other problems related to housing and economic circumstances: Secondary | ICD-10-CM

## 2019-07-12 DIAGNOSIS — Z6832 Body mass index (BMI) 32.0-32.9, adult: Secondary | ICD-10-CM

## 2019-07-12 DIAGNOSIS — B9562 Methicillin resistant Staphylococcus aureus infection as the cause of diseases classified elsewhere: Secondary | ICD-10-CM | POA: Diagnosis present

## 2019-07-12 DIAGNOSIS — I50810 Right heart failure, unspecified: Secondary | ICD-10-CM | POA: Diagnosis present

## 2019-07-12 DIAGNOSIS — N179 Acute kidney failure, unspecified: Secondary | ICD-10-CM | POA: Diagnosis present

## 2019-07-12 DIAGNOSIS — M25461 Effusion, right knee: Secondary | ICD-10-CM | POA: Diagnosis not present

## 2019-07-12 DIAGNOSIS — K0889 Other specified disorders of teeth and supporting structures: Secondary | ICD-10-CM | POA: Diagnosis present

## 2019-07-12 DIAGNOSIS — Z20822 Contact with and (suspected) exposure to covid-19: Secondary | ICD-10-CM | POA: Diagnosis present

## 2019-07-12 DIAGNOSIS — J9600 Acute respiratory failure, unspecified whether with hypoxia or hypercapnia: Secondary | ICD-10-CM

## 2019-07-12 DIAGNOSIS — R319 Hematuria, unspecified: Secondary | ICD-10-CM | POA: Diagnosis not present

## 2019-07-12 DIAGNOSIS — I110 Hypertensive heart disease with heart failure: Secondary | ICD-10-CM | POA: Diagnosis present

## 2019-07-12 DIAGNOSIS — I4819 Other persistent atrial fibrillation: Secondary | ICD-10-CM | POA: Diagnosis present

## 2019-07-12 DIAGNOSIS — Z5329 Procedure and treatment not carried out because of patient's decision for other reasons: Secondary | ICD-10-CM | POA: Diagnosis not present

## 2019-07-12 DIAGNOSIS — E871 Hypo-osmolality and hyponatremia: Secondary | ICD-10-CM | POA: Diagnosis present

## 2019-07-12 DIAGNOSIS — A419 Sepsis, unspecified organism: Secondary | ICD-10-CM

## 2019-07-12 DIAGNOSIS — R6520 Severe sepsis without septic shock: Secondary | ICD-10-CM | POA: Diagnosis present

## 2019-07-12 DIAGNOSIS — M00062 Staphylococcal arthritis, left knee: Secondary | ICD-10-CM | POA: Diagnosis present

## 2019-07-12 DIAGNOSIS — D1779 Benign lipomatous neoplasm of other sites: Secondary | ICD-10-CM | POA: Diagnosis present

## 2019-07-12 DIAGNOSIS — Z597 Insufficient social insurance and welfare support: Secondary | ICD-10-CM

## 2019-07-12 DIAGNOSIS — Z87891 Personal history of nicotine dependence: Secondary | ICD-10-CM

## 2019-07-12 DIAGNOSIS — E876 Hypokalemia: Secondary | ICD-10-CM | POA: Diagnosis not present

## 2019-07-12 DIAGNOSIS — E860 Dehydration: Secondary | ICD-10-CM | POA: Diagnosis present

## 2019-07-12 DIAGNOSIS — Z87442 Personal history of urinary calculi: Secondary | ICD-10-CM

## 2019-07-12 DIAGNOSIS — Y831 Surgical operation with implant of artificial internal device as the cause of abnormal reaction of the patient, or of later complication, without mention of misadventure at the time of the procedure: Secondary | ICD-10-CM | POA: Diagnosis present

## 2019-07-12 DIAGNOSIS — Y772 Prosthetic and other implants, materials and accessory ophthalmic devices associated with adverse incidents: Secondary | ICD-10-CM | POA: Diagnosis present

## 2019-07-12 DIAGNOSIS — R7881 Bacteremia: Secondary | ICD-10-CM

## 2019-07-12 DIAGNOSIS — Z9114 Patient's other noncompliance with medication regimen: Secondary | ICD-10-CM

## 2019-07-12 DIAGNOSIS — Z7982 Long term (current) use of aspirin: Secondary | ICD-10-CM

## 2019-07-12 DIAGNOSIS — Z4659 Encounter for fitting and adjustment of other gastrointestinal appliance and device: Secondary | ICD-10-CM

## 2019-07-12 DIAGNOSIS — Z791 Long term (current) use of non-steroidal anti-inflammatories (NSAID): Secondary | ICD-10-CM

## 2019-07-12 DIAGNOSIS — R0689 Other abnormalities of breathing: Secondary | ICD-10-CM | POA: Diagnosis present

## 2019-07-12 DIAGNOSIS — Z452 Encounter for adjustment and management of vascular access device: Secondary | ICD-10-CM

## 2019-07-12 DIAGNOSIS — M545 Low back pain, unspecified: Secondary | ICD-10-CM

## 2019-07-12 DIAGNOSIS — I081 Rheumatic disorders of both mitral and tricuspid valves: Secondary | ICD-10-CM | POA: Diagnosis present

## 2019-07-12 DIAGNOSIS — R739 Hyperglycemia, unspecified: Secondary | ICD-10-CM | POA: Diagnosis not present

## 2019-07-12 DIAGNOSIS — K145 Plicated tongue: Secondary | ICD-10-CM | POA: Diagnosis present

## 2019-07-12 DIAGNOSIS — J029 Acute pharyngitis, unspecified: Secondary | ICD-10-CM | POA: Diagnosis present

## 2019-07-12 DIAGNOSIS — J9601 Acute respiratory failure with hypoxia: Secondary | ICD-10-CM | POA: Diagnosis not present

## 2019-07-12 DIAGNOSIS — B961 Klebsiella pneumoniae [K. pneumoniae] as the cause of diseases classified elsewhere: Secondary | ICD-10-CM | POA: Diagnosis not present

## 2019-07-12 DIAGNOSIS — I4581 Long QT syndrome: Secondary | ICD-10-CM | POA: Diagnosis not present

## 2019-07-12 DIAGNOSIS — T368X5A Adverse effect of other systemic antibiotics, initial encounter: Secondary | ICD-10-CM | POA: Diagnosis not present

## 2019-07-12 DIAGNOSIS — F05 Delirium due to known physiological condition: Secondary | ICD-10-CM | POA: Diagnosis not present

## 2019-07-12 DIAGNOSIS — G8929 Other chronic pain: Secondary | ICD-10-CM | POA: Diagnosis present

## 2019-07-12 DIAGNOSIS — Y92230 Patient room in hospital as the place of occurrence of the external cause: Secondary | ICD-10-CM | POA: Diagnosis not present

## 2019-07-12 DIAGNOSIS — R7303 Prediabetes: Secondary | ICD-10-CM | POA: Diagnosis not present

## 2019-07-12 DIAGNOSIS — M009 Pyogenic arthritis, unspecified: Secondary | ICD-10-CM

## 2019-07-12 LAB — COMPREHENSIVE METABOLIC PANEL
ALT: 17 U/L (ref 0–44)
AST: 29 U/L (ref 15–41)
Albumin: 2.5 g/dL — ABNORMAL LOW (ref 3.5–5.0)
Alkaline Phosphatase: 94 U/L (ref 38–126)
Anion gap: 17 — ABNORMAL HIGH (ref 5–15)
BUN: 43 mg/dL — ABNORMAL HIGH (ref 6–20)
CO2: 22 mmol/L (ref 22–32)
Calcium: 8.7 mg/dL — ABNORMAL LOW (ref 8.9–10.3)
Chloride: 91 mmol/L — ABNORMAL LOW (ref 98–111)
Creatinine, Ser: 3.36 mg/dL — ABNORMAL HIGH (ref 0.61–1.24)
GFR calc Af Amer: 22 mL/min — ABNORMAL LOW (ref >60)
GFR calc non Af Amer: 19 mL/min — ABNORMAL LOW (ref >60)
Glucose, Bld: 122 mg/dL — ABNORMAL HIGH (ref 70–99)
Potassium: 3.6 mmol/L (ref 3.5–5.1)
Sodium: 130 mmol/L — ABNORMAL LOW (ref 135–145)
Total Bilirubin: 1.9 mg/dL — ABNORMAL HIGH (ref 0.3–1.2)
Total Protein: 6.9 g/dL (ref 6.5–8.1)

## 2019-07-12 LAB — CBC WITH DIFFERENTIAL/PLATELET
Abs Immature Granulocytes: 0.91 10*3/uL — ABNORMAL HIGH (ref 0.00–0.07)
Basophils Absolute: 0 10*3/uL (ref 0.0–0.1)
Basophils Relative: 0 %
Eosinophils Absolute: 0.1 10*3/uL (ref 0.0–0.5)
Eosinophils Relative: 1 %
HCT: 40.3 % (ref 39.0–52.0)
Hemoglobin: 13.3 g/dL (ref 13.0–17.0)
Immature Granulocytes: 6 %
Lymphocytes Relative: 4 %
Lymphs Abs: 0.6 10*3/uL — ABNORMAL LOW (ref 0.7–4.0)
MCH: 29.2 pg (ref 26.0–34.0)
MCHC: 33 g/dL (ref 30.0–36.0)
MCV: 88.6 fL (ref 80.0–100.0)
Monocytes Absolute: 1 10*3/uL (ref 0.1–1.0)
Monocytes Relative: 6 %
Neutro Abs: 13.5 10*3/uL — ABNORMAL HIGH (ref 1.7–7.7)
Neutrophils Relative %: 83 %
Platelets: 227 10*3/uL (ref 150–400)
RBC: 4.55 MIL/uL (ref 4.22–5.81)
RDW: 14 % (ref 11.5–15.5)
WBC: 16 10*3/uL — ABNORMAL HIGH (ref 4.0–10.5)
nRBC: 0 % (ref 0.0–0.2)

## 2019-07-12 LAB — LACTIC ACID, PLASMA: Lactic Acid, Venous: 4.2 mmol/L (ref 0.5–1.9)

## 2019-07-12 MED ORDER — SODIUM CHLORIDE 0.9% FLUSH
3.0000 mL | Freq: Once | INTRAVENOUS | Status: AC
  Administered 2019-07-13: 01:00:00 3 mL via INTRAVENOUS

## 2019-07-12 NOTE — ED Triage Notes (Signed)
Pt states he had a left knee replacement 4 years ago. Pt states over the past 3 days his left knee became swollen, red, and painful. Pt is unable to put weight on on it. Pt has not injured his knee.

## 2019-07-13 ENCOUNTER — Encounter (HOSPITAL_COMMUNITY)
Admission: EM | Disposition: A | Discharge status: Home Health | Payer: Self-pay | Source: Home / Self Care | Attending: Internal Medicine

## 2019-07-13 ENCOUNTER — Inpatient Hospital Stay (HOSPITAL_COMMUNITY): Payer: Self-pay | Admitting: Certified Registered"

## 2019-07-13 ENCOUNTER — Inpatient Hospital Stay (HOSPITAL_COMMUNITY): Payer: Self-pay

## 2019-07-13 ENCOUNTER — Encounter (HOSPITAL_COMMUNITY): Payer: Self-pay | Admitting: Pulmonary Disease

## 2019-07-13 DIAGNOSIS — A419 Sepsis, unspecified organism: Secondary | ICD-10-CM

## 2019-07-13 DIAGNOSIS — I959 Hypotension, unspecified: Secondary | ICD-10-CM | POA: Insufficient documentation

## 2019-07-13 DIAGNOSIS — I4891 Unspecified atrial fibrillation: Secondary | ICD-10-CM

## 2019-07-13 DIAGNOSIS — E871 Hypo-osmolality and hyponatremia: Secondary | ICD-10-CM

## 2019-07-13 DIAGNOSIS — Z7289 Other problems related to lifestyle: Secondary | ICD-10-CM | POA: Insufficient documentation

## 2019-07-13 DIAGNOSIS — R6520 Severe sepsis without septic shock: Secondary | ICD-10-CM | POA: Diagnosis present

## 2019-07-13 DIAGNOSIS — M009 Pyogenic arthritis, unspecified: Secondary | ICD-10-CM | POA: Diagnosis present

## 2019-07-13 DIAGNOSIS — F1090 Alcohol use, unspecified, uncomplicated: Secondary | ICD-10-CM | POA: Insufficient documentation

## 2019-07-13 DIAGNOSIS — Z789 Other specified health status: Secondary | ICD-10-CM | POA: Insufficient documentation

## 2019-07-13 DIAGNOSIS — R0689 Other abnormalities of breathing: Secondary | ICD-10-CM | POA: Diagnosis present

## 2019-07-13 DIAGNOSIS — N179 Acute kidney failure, unspecified: Secondary | ICD-10-CM

## 2019-07-13 HISTORY — PX: I & D KNEE WITH POLY EXCHANGE: SHX5024

## 2019-07-13 LAB — COMPREHENSIVE METABOLIC PANEL
ALT: 14 U/L (ref 0–44)
AST: 24 U/L (ref 15–41)
Albumin: 1.9 g/dL — ABNORMAL LOW (ref 3.5–5.0)
Alkaline Phosphatase: 68 U/L (ref 38–126)
Anion gap: 14 (ref 5–15)
BUN: 44 mg/dL — ABNORMAL HIGH (ref 6–20)
CO2: 21 mmol/L — ABNORMAL LOW (ref 22–32)
Calcium: 8.1 mg/dL — ABNORMAL LOW (ref 8.9–10.3)
Chloride: 95 mmol/L — ABNORMAL LOW (ref 98–111)
Creatinine, Ser: 3.08 mg/dL — ABNORMAL HIGH (ref 0.61–1.24)
GFR calc Af Amer: 25 mL/min — ABNORMAL LOW (ref >60)
GFR calc non Af Amer: 21 mL/min — ABNORMAL LOW (ref >60)
Glucose, Bld: 132 mg/dL — ABNORMAL HIGH (ref 70–99)
Potassium: 3.8 mmol/L (ref 3.5–5.1)
Sodium: 130 mmol/L — ABNORMAL LOW (ref 135–145)
Total Bilirubin: 1.6 mg/dL — ABNORMAL HIGH (ref 0.3–1.2)
Total Protein: 5.3 g/dL — ABNORMAL LOW (ref 6.5–8.1)

## 2019-07-13 LAB — BLOOD CULTURE ID PANEL (REFLEXED)

## 2019-07-13 LAB — SYNOVIAL CELL COUNT + DIFF, W/ CRYSTALS
Crystals, Fluid: NONE SEEN
Other Cells-SYN: UNDETERMINED
WBC, Synovial: 96500 /mm3 — ABNORMAL HIGH (ref 0–200)

## 2019-07-13 LAB — CORTISOL: Cortisol, Plasma: 25.2 ug/dL

## 2019-07-13 LAB — POCT I-STAT 7, (LYTES, BLD GAS, ICA,H+H)
Acid-base deficit: 5 mmol/L — ABNORMAL HIGH (ref 0.0–2.0)
Bicarbonate: 20.2 mmol/L (ref 20.0–28.0)
Calcium, Ion: 1.07 mmol/L — ABNORMAL LOW (ref 1.15–1.40)
HCT: 34 % — ABNORMAL LOW (ref 39.0–52.0)
Hemoglobin: 11.6 g/dL — ABNORMAL LOW (ref 13.0–17.0)
O2 Saturation: 94 %
Patient temperature: 97.7
Potassium: 3.9 mmol/L (ref 3.5–5.1)
Sodium: 130 mmol/L — ABNORMAL LOW (ref 135–145)
TCO2: 21 mmol/L — ABNORMAL LOW (ref 22–32)
pCO2 arterial: 35.9 mmHg (ref 32.0–48.0)
pH, Arterial: 7.355 (ref 7.350–7.450)
pO2, Arterial: 72 mmHg — ABNORMAL LOW (ref 83.0–108.0)

## 2019-07-13 LAB — CBC
HCT: 34.8 % — ABNORMAL LOW (ref 39.0–52.0)
Hemoglobin: 11.5 g/dL — ABNORMAL LOW (ref 13.0–17.0)
MCH: 29.1 pg (ref 26.0–34.0)
MCHC: 33 g/dL (ref 30.0–36.0)
MCV: 88.1 fL (ref 80.0–100.0)
Platelets: 164 10*3/uL (ref 150–400)
RBC: 3.95 MIL/uL — ABNORMAL LOW (ref 4.22–5.81)
RDW: 14.1 % (ref 11.5–15.5)
WBC: 12.9 10*3/uL — ABNORMAL HIGH (ref 4.0–10.5)
nRBC: 0 % (ref 0.0–0.2)

## 2019-07-13 LAB — URINALYSIS, MICROSCOPIC (REFLEX)

## 2019-07-13 LAB — RAPID URINE DRUG SCREEN, HOSP PERFORMED
Amphetamines: POSITIVE — AB
Barbiturates: NOT DETECTED
Benzodiazepines: POSITIVE — AB
Cocaine: NOT DETECTED
Opiates: POSITIVE — AB
Tetrahydrocannabinol: NOT DETECTED

## 2019-07-13 LAB — GLUCOSE, CAPILLARY
Glucose-Capillary: 79 mg/dL (ref 70–99)
Glucose-Capillary: 141 mg/dL — ABNORMAL HIGH (ref 70–99)

## 2019-07-13 LAB — TSH: TSH: 1.796 u[IU]/mL (ref 0.350–4.500)

## 2019-07-13 LAB — URINALYSIS, ROUTINE W REFLEX MICROSCOPIC
Bilirubin Urine: NEGATIVE
Glucose, UA: NEGATIVE mg/dL
Ketones, ur: 15 mg/dL — AB
Leukocytes,Ua: NEGATIVE
Nitrite: NEGATIVE
Protein, ur: NEGATIVE mg/dL
Specific Gravity, Urine: 1.02 (ref 1.005–1.030)
pH: 5 (ref 5.0–8.0)

## 2019-07-13 LAB — D-DIMER, QUANTITATIVE: D-Dimer, Quant: 3.78 ug/mL-FEU — ABNORMAL HIGH (ref 0.00–0.50)

## 2019-07-13 LAB — RESPIRATORY PANEL BY RT PCR (FLU A&B, COVID)
Influenza A by PCR: NEGATIVE
Influenza B by PCR: NEGATIVE
SARS Coronavirus 2 by RT PCR: NEGATIVE

## 2019-07-13 LAB — HEMOGLOBIN A1C
Hgb A1c MFr Bld: 6.3 % — ABNORMAL HIGH (ref 4.8–5.6)
Mean Plasma Glucose: 134.11 mg/dL

## 2019-07-13 LAB — LACTIC ACID, PLASMA
Lactic Acid, Venous: 3.6 mmol/L (ref 0.5–1.9)
Lactic Acid, Venous: 3.3 mmol/L (ref 0.5–1.9)
Lactic Acid, Venous: 2.4 mmol/L (ref 0.5–1.9)

## 2019-07-13 LAB — SEDIMENTATION RATE: Sed Rate: 57 mm/hr — ABNORMAL HIGH (ref 0–16)

## 2019-07-13 LAB — APTT: aPTT: 30 seconds (ref 24–36)

## 2019-07-13 LAB — HIV ANTIBODY (ROUTINE TESTING W REFLEX): HIV Screen 4th Generation wRfx: NONREACTIVE

## 2019-07-13 LAB — HEPARIN LEVEL (UNFRACTIONATED)
Heparin Unfractionated: 0.81 IU/mL — ABNORMAL HIGH (ref 0.30–0.70)
Heparin Unfractionated: <0.1 IU/mL — ABNORMAL LOW (ref 0.30–0.70)

## 2019-07-13 LAB — SARS CORONAVIRUS 2 (TAT 6-24 HRS): SARS Coronavirus 2: NEGATIVE

## 2019-07-13 LAB — PROTIME-INR
INR: 1.5 — ABNORMAL HIGH (ref 0.8–1.2)
Prothrombin Time: 17.9 seconds — ABNORMAL HIGH (ref 11.4–15.2)

## 2019-07-13 LAB — C-REACTIVE PROTEIN: CRP: 31.6 mg/dL — ABNORMAL HIGH (ref <1.0)

## 2019-07-13 LAB — MRSA PCR SCREENING: MRSA by PCR: POSITIVE — AB

## 2019-07-13 LAB — GROUP A STREP BY PCR: Group A Strep by PCR: NOT DETECTED

## 2019-07-13 LAB — PROCALCITONIN: Procalcitonin: 8.28 ng/mL

## 2019-07-13 LAB — POC SARS CORONAVIRUS 2 AG -  ED: SARS Coronavirus 2 Ag: NEGATIVE

## 2019-07-13 LAB — PHOSPHORUS: Phosphorus: 3.1 mg/dL (ref 2.5–4.6)

## 2019-07-13 LAB — BRAIN NATRIURETIC PEPTIDE: B Natriuretic Peptide: 299.7 pg/mL — ABNORMAL HIGH (ref 0.0–100.0)

## 2019-07-13 LAB — MAGNESIUM: Magnesium: 1 mg/dL — ABNORMAL LOW (ref 1.7–2.4)

## 2019-07-13 SURGERY — IRRIGATION AND DEBRIDEMENT KNEE WITH POLY EXCHANGE
Anesthesia: General | Site: Knee | Laterality: Left

## 2019-07-13 MED ORDER — SUCCINYLCHOLINE 20MG/ML (10ML) SYRINGE FOR MEDFUSION PUMP - OPTIME
INTRAMUSCULAR | Status: DC | PRN
  Administered 2019-07-13: 160 mg via INTRAVENOUS

## 2019-07-13 MED ORDER — DEXAMETHASONE SODIUM PHOSPHATE 10 MG/ML IJ SOLN
INTRAMUSCULAR | Status: AC
  Filled 2019-07-13: qty 1

## 2019-07-13 MED ORDER — HYDROMORPHONE HCL 1 MG/ML IJ SOLN
INTRAMUSCULAR | Status: AC
  Filled 2019-07-13: qty 1

## 2019-07-13 MED ORDER — LACTATED RINGERS IV BOLUS
1000.0000 mL | Freq: Once | INTRAVENOUS | Status: AC
  Administered 2019-07-13: 1000 mL via INTRAVENOUS

## 2019-07-13 MED ORDER — VANCOMYCIN HCL 1000 MG IV SOLR
INTRAVENOUS | Status: AC
  Filled 2019-07-13: qty 1000

## 2019-07-13 MED ORDER — MUPIROCIN 2 % EX OINT
1.0000 "application " | TOPICAL_OINTMENT | Freq: Two times a day (BID) | CUTANEOUS | Status: DC
  Filled 2019-07-13: qty 22

## 2019-07-13 MED ORDER — ROCURONIUM 10MG/ML (10ML) SYRINGE FOR MEDFUSION PUMP - OPTIME
INTRAVENOUS | Status: DC | PRN
  Administered 2019-07-13: 30 mg via INTRAVENOUS

## 2019-07-13 MED ORDER — DEXAMETHASONE SODIUM PHOSPHATE 10 MG/ML IJ SOLN
INTRAMUSCULAR | Status: AC
  Filled 2019-07-13: qty 2

## 2019-07-13 MED ORDER — DIPHENHYDRAMINE HCL 12.5 MG/5ML PO ELIX
12.5000 mg | ORAL_SOLUTION | Freq: Three times a day (TID) | ORAL | Status: DC | PRN
  Filled 2019-07-13: qty 10

## 2019-07-13 MED ORDER — CHLORHEXIDINE GLUCONATE CLOTH 2 % EX PADS
6.0000 | MEDICATED_PAD | Freq: Every day | CUTANEOUS | Status: DC
  Administered 2019-07-13 – 2019-07-18 (×6): 6 via TOPICAL

## 2019-07-13 MED ORDER — ADULT MULTIVITAMIN W/MINERALS CH
1.0000 | ORAL_TABLET | Freq: Every day | ORAL | Status: DC
  Administered 2019-07-13 – 2019-07-27 (×12): 1 via ORAL
  Filled 2019-07-13 (×13): qty 1

## 2019-07-13 MED ORDER — EPHEDRINE 5 MG/ML INJ
INTRAVENOUS | Status: AC
  Filled 2019-07-13: qty 10

## 2019-07-13 MED ORDER — AMIODARONE HCL IN DEXTROSE 360-4.14 MG/200ML-% IV SOLN
60.0000 mg/h | INTRAVENOUS | Status: AC
  Administered 2019-07-13 (×2): 60 mg/h via INTRAVENOUS
  Filled 2019-07-13 (×2): qty 200

## 2019-07-13 MED ORDER — SUGAMMADEX SODIUM 200 MG/2ML IV SOLN
INTRAVENOUS | Status: DC | PRN
  Administered 2019-07-13: 200 mg via INTRAVENOUS

## 2019-07-13 MED ORDER — SODIUM CHLORIDE 0.9 % IV SOLN: INTRAVENOUS | Status: DC | PRN

## 2019-07-13 MED ORDER — DIPHENHYDRAMINE HCL 50 MG/ML IJ SOLN
12.5000 mg | Freq: Four times a day (QID) | INTRAMUSCULAR | Status: DC | PRN
  Administered 2019-07-17 – 2019-07-21 (×3): 12.5 mg via INTRAVENOUS
  Filled 2019-07-13 (×3): qty 1

## 2019-07-13 MED ORDER — THIAMINE HCL 100 MG PO TABS
100.0000 mg | ORAL_TABLET | Freq: Every day | ORAL | Status: DC
  Administered 2019-07-16 – 2019-07-27 (×9): 100 mg via ORAL
  Filled 2019-07-13 (×11): qty 1

## 2019-07-13 MED ORDER — ACETAMINOPHEN 650 MG RE SUPP: 650.0000 mg | Freq: Four times a day (QID) | RECTAL | Status: DC | PRN

## 2019-07-13 MED ORDER — ONDANSETRON HCL 4 MG/2ML IJ SOLN: 4.0000 mg | Freq: Once | INTRAMUSCULAR | Status: DC | PRN

## 2019-07-13 MED ORDER — METHYLPREDNISOLONE SODIUM SUCC 125 MG IJ SOLR
125.0000 mg | Freq: Once | INTRAMUSCULAR | Status: AC
  Administered 2019-07-13: 10:00:00 125 mg via INTRAVENOUS
  Filled 2019-07-13: qty 2

## 2019-07-13 MED ORDER — SODIUM CHLORIDE 0.9 % IV SOLN
2.0000 g | INTRAVENOUS | Status: DC
  Administered 2019-07-13: 2 g via INTRAVENOUS
  Filled 2019-07-13: qty 20

## 2019-07-13 MED ORDER — FOLIC ACID 1 MG PO TABS
1.0000 mg | ORAL_TABLET | Freq: Every day | ORAL | Status: DC
  Administered 2019-07-13 – 2019-07-27 (×12): 1 mg via ORAL
  Filled 2019-07-13 (×13): qty 1

## 2019-07-13 MED ORDER — VANCOMYCIN VARIABLE DOSE PER UNSTABLE RENAL FUNCTION (PHARMACIST DOSING): Status: DC

## 2019-07-13 MED ORDER — MIDAZOLAM HCL 2 MG/2ML IJ SOLN
INTRAMUSCULAR | Status: AC
  Filled 2019-07-13: qty 2

## 2019-07-13 MED ORDER — LORAZEPAM 2 MG/ML IJ SOLN
1.0000 mg | INTRAMUSCULAR | Status: DC | PRN
  Administered 2019-07-14: 15:00:00 3 mg via INTRAVENOUS
  Administered 2019-07-14: 01:00:00 2 mg via INTRAVENOUS
  Administered 2019-07-14: 4 mg via INTRAVENOUS
  Administered 2019-07-15: 2 mg via INTRAVENOUS
  Filled 2019-07-13: qty 1
  Filled 2019-07-13: qty 2
  Filled 2019-07-13: qty 1
  Filled 2019-07-13: qty 2
  Filled 2019-07-13: qty 1

## 2019-07-13 MED ORDER — SODIUM CHLORIDE 0.9 % IR SOLN
Status: DC | PRN
  Administered 2019-07-13: 1000 mL
  Administered 2019-07-13 (×2): 3000 mL

## 2019-07-13 MED ORDER — MAGNESIUM SULFATE 2 GM/50ML IV SOLN
2.0000 g | Freq: Once | INTRAVENOUS | Status: AC
  Administered 2019-07-13: 08:00:00 2 g via INTRAVENOUS
  Filled 2019-07-13: qty 50

## 2019-07-13 MED ORDER — ONDANSETRON HCL 4 MG/2ML IJ SOLN
4.0000 mg | Freq: Once | INTRAMUSCULAR | Status: AC
  Administered 2019-07-13: 01:00:00 4 mg via INTRAVENOUS
  Filled 2019-07-13: qty 2

## 2019-07-13 MED ORDER — LORAZEPAM 1 MG PO TABS
1.0000 mg | ORAL_TABLET | ORAL | Status: DC | PRN
  Administered 2019-07-15: 20:00:00 1 mg via ORAL
  Filled 2019-07-13: qty 2

## 2019-07-13 MED ORDER — PROPOFOL 10 MG/ML IV BOLUS
INTRAVENOUS | Status: DC | PRN
  Administered 2019-07-13: 110 ug via INTRAVENOUS

## 2019-07-13 MED ORDER — SODIUM CHLORIDE 0.9 % IV SOLN: INTRAVENOUS | Status: DC

## 2019-07-13 MED ORDER — FENTANYL CITRATE (PF) 250 MCG/5ML IJ SOLN
INTRAMUSCULAR | Status: DC | PRN
  Administered 2019-07-13: 100 ug via INTRAVENOUS

## 2019-07-13 MED ORDER — SODIUM CHLORIDE 0.9 % IV SOLN: INTRAVENOUS | Status: AC

## 2019-07-13 MED ORDER — ACETAMINOPHEN 325 MG PO TABS: 650.0000 mg | ORAL_TABLET | Freq: Four times a day (QID) | ORAL | Status: DC | PRN

## 2019-07-13 MED ORDER — MAGNESIUM SULFATE 2 GM/50ML IV SOLN: 2.0000 g | Freq: Once | INTRAVENOUS | Status: DC

## 2019-07-13 MED ORDER — AMIODARONE HCL IN DEXTROSE 360-4.14 MG/200ML-% IV SOLN
30.0000 mg/h | INTRAVENOUS | Status: DC
  Administered 2019-07-13: 16:00:00 30 mg/h via INTRAVENOUS
  Filled 2019-07-13: qty 200

## 2019-07-13 MED ORDER — ONDANSETRON HCL 4 MG/2ML IJ SOLN: 4.0000 mg | Freq: Four times a day (QID) | INTRAMUSCULAR | Status: DC | PRN

## 2019-07-13 MED ORDER — DILTIAZEM HCL-DEXTROSE 125-5 MG/125ML-% IV SOLN (PREMIX)
5.0000 mg/h | INTRAVENOUS | Status: DC
  Administered 2019-07-13: 5 mg/h via INTRAVENOUS
  Filled 2019-07-13: qty 125

## 2019-07-13 MED ORDER — HEPARIN (PORCINE) 25000 UT/250ML-% IV SOLN
1500.0000 [IU]/h | INTRAVENOUS | Status: DC
  Administered 2019-07-13: 1500 [IU]/h via INTRAVENOUS
  Filled 2019-07-13 (×2): qty 250

## 2019-07-13 MED ORDER — AMIODARONE LOAD VIA INFUSION
150.0000 mg | Freq: Once | INTRAVENOUS | Status: AC
  Administered 2019-07-13: 150 mg via INTRAVENOUS
  Filled 2019-07-13: qty 83.34

## 2019-07-13 MED ORDER — FENTANYL CITRATE (PF) 250 MCG/5ML IJ SOLN
INTRAMUSCULAR | Status: AC
  Filled 2019-07-13: qty 5

## 2019-07-13 MED ORDER — MEPERIDINE HCL 25 MG/ML IJ SOLN: 6.2500 mg | INTRAMUSCULAR | Status: DC | PRN

## 2019-07-13 MED ORDER — SUCCINYLCHOLINE CHLORIDE 200 MG/10ML IV SOSY
PREFILLED_SYRINGE | INTRAVENOUS | Status: AC
  Filled 2019-07-13: qty 10

## 2019-07-13 MED ORDER — MAGNESIUM SULFATE 4 GM/100ML IV SOLN
4.0000 g | Freq: Once | INTRAVENOUS | Status: AC
  Administered 2019-07-13: 4 g via INTRAVENOUS
  Filled 2019-07-13: qty 100

## 2019-07-13 MED ORDER — VANCOMYCIN HCL 10 G IV SOLR
2000.0000 mg | Freq: Once | INTRAVENOUS | Status: AC
  Administered 2019-07-13: 2000 mg via INTRAVENOUS
  Filled 2019-07-13: qty 2000

## 2019-07-13 MED ORDER — PHENYLEPHRINE HCL (PRESSORS) 10 MG/ML IV SOLN
INTRAVENOUS | Status: DC | PRN
  Administered 2019-07-13: 120 ug via INTRAVENOUS

## 2019-07-13 MED ORDER — HEPARIN (PORCINE) 25000 UT/250ML-% IV SOLN
2950.0000 [IU]/h | INTRAVENOUS | Status: DC
  Administered 2019-07-14: 09:00:00 1750 [IU]/h via INTRAVENOUS
  Administered 2019-07-14: 2000 [IU]/h via INTRAVENOUS
  Administered 2019-07-15: 2400 [IU]/h via INTRAVENOUS
  Administered 2019-07-15 – 2019-07-16 (×2): 2950 [IU]/h via INTRAVENOUS
  Filled 2019-07-13 (×5): qty 250

## 2019-07-13 MED ORDER — DILTIAZEM LOAD VIA INFUSION
20.0000 mg | Freq: Once | INTRAVENOUS | Status: AC
  Administered 2019-07-13: 20 mg via INTRAVENOUS
  Filled 2019-07-13: qty 20

## 2019-07-13 MED ORDER — LIDOCAINE-EPINEPHRINE (PF) 2 %-1:200000 IJ SOLN
20.0000 mL | Freq: Once | INTRAMUSCULAR | Status: DC
  Filled 2019-07-13: qty 20

## 2019-07-13 MED ORDER — PHENYLEPHRINE 40 MCG/ML (10ML) SYRINGE FOR IV PUSH (FOR BLOOD PRESSURE SUPPORT)
PREFILLED_SYRINGE | INTRAVENOUS | Status: AC
  Filled 2019-07-13: qty 10

## 2019-07-13 MED ORDER — HYDROMORPHONE HCL 1 MG/ML IJ SOLN
0.2500 mg | INTRAMUSCULAR | Status: DC | PRN
  Administered 2019-07-13: 0.25 mg via INTRAVENOUS

## 2019-07-13 MED ORDER — ONDANSETRON HCL 4 MG/2ML IJ SOLN
INTRAMUSCULAR | Status: DC | PRN
  Administered 2019-07-13: 4 mg via INTRAVENOUS

## 2019-07-13 MED ORDER — PROPOFOL 10 MG/ML IV BOLUS
INTRAVENOUS | Status: AC
  Filled 2019-07-13: qty 20

## 2019-07-13 MED ORDER — SODIUM CHLORIDE 0.9% FLUSH
10.0000 mL | INTRAVENOUS | Status: DC | PRN
  Administered 2019-07-29 – 2019-08-10 (×2): 10 mL

## 2019-07-13 MED ORDER — LIDOCAINE HCL (CARDIAC) PF 100 MG/5ML IV SOSY
PREFILLED_SYRINGE | INTRAVENOUS | Status: DC | PRN
  Administered 2019-07-13: 100 mg via INTRATRACHEAL

## 2019-07-13 MED ORDER — VANCOMYCIN HCL 1000 MG IV SOLR
INTRAVENOUS | Status: DC | PRN
  Administered 2019-07-13: 1000 mg via TOPICAL

## 2019-07-13 MED ORDER — ONDANSETRON HCL 4 MG/2ML IJ SOLN
INTRAMUSCULAR | Status: AC
  Filled 2019-07-13: qty 2

## 2019-07-13 MED ORDER — DILTIAZEM HCL 25 MG/5ML IV SOLN
25.0000 mg | Freq: Once | INTRAVENOUS | Status: AC
  Administered 2019-07-13: 25 mg via INTRAVENOUS

## 2019-07-13 MED ORDER — HEPARIN BOLUS VIA INFUSION
5500.0000 [IU] | Freq: Once | INTRAVENOUS | Status: AC
  Administered 2019-07-13: 12:00:00 5500 [IU] via INTRAVENOUS
  Filled 2019-07-13: qty 5500

## 2019-07-13 MED ORDER — THIAMINE HCL 100 MG/ML IJ SOLN
100.0000 mg | Freq: Every day | INTRAMUSCULAR | Status: DC
  Administered 2019-07-13 – 2019-07-19 (×4): 100 mg via INTRAVENOUS
  Filled 2019-07-13 (×6): qty 2

## 2019-07-13 MED ORDER — PIPERACILLIN-TAZOBACTAM 3.375 G IVPB
3.3750 g | Freq: Three times a day (TID) | INTRAVENOUS | Status: DC
  Filled 2019-07-13 (×2): qty 50

## 2019-07-13 MED ORDER — LIDOCAINE 2% (20 MG/ML) 5 ML SYRINGE
INTRAMUSCULAR | Status: AC
  Filled 2019-07-13: qty 5

## 2019-07-13 MED ORDER — FENTANYL CITRATE (PF) 100 MCG/2ML IJ SOLN
50.0000 ug | Freq: Once | INTRAMUSCULAR | Status: AC
  Administered 2019-07-13: 01:00:00 50 ug via INTRAVENOUS
  Filled 2019-07-13: qty 2

## 2019-07-13 MED ORDER — BUPIVACAINE-EPINEPHRINE 0.5% -1:200000 IJ SOLN
INTRAMUSCULAR | Status: AC
  Filled 2019-07-13: qty 1

## 2019-07-13 MED ORDER — BUPIVACAINE-EPINEPHRINE 0.5% -1:200000 IJ SOLN
INTRAMUSCULAR | Status: DC | PRN
  Administered 2019-07-13: 50 mL

## 2019-07-13 MED ORDER — ROCURONIUM BROMIDE 10 MG/ML (PF) SYRINGE
PREFILLED_SYRINGE | INTRAVENOUS | Status: AC
  Filled 2019-07-13: qty 10

## 2019-07-13 MED ORDER — FAMOTIDINE IN NACL 20-0.9 MG/50ML-% IV SOLN
20.0000 mg | Freq: Two times a day (BID) | INTRAVENOUS | Status: DC
  Administered 2019-07-13: 20 mg via INTRAVENOUS
  Filled 2019-07-13: qty 50

## 2019-07-13 MED ORDER — MIDAZOLAM HCL 2 MG/2ML IJ SOLN
INTRAMUSCULAR | Status: DC | PRN
  Administered 2019-07-13: 1 mg via INTRAVENOUS

## 2019-07-13 MED ORDER — SODIUM CHLORIDE 0.9 % IV SOLN
8.0000 mg/kg | Freq: Every day | INTRAVENOUS | Status: DC
  Administered 2019-07-13: 944 mg via INTRAVENOUS
  Filled 2019-07-13 (×2): qty 18.88

## 2019-07-13 SURGICAL SUPPLY — 79 items
ADH SKN CLS APL DERMABOND .7 (GAUZE/BANDAGES/DRESSINGS) ×1
ATTUNE PSRP INSR SZ7 7 KNEE (Insert) ×1 IMPLANT
ATTUNE PSRP INSR SZ7 7MM KNEE (Insert) ×1 IMPLANT
BANDAGE ESMARK 6X9 LF (GAUZE/BANDAGES/DRESSINGS) ×1 IMPLANT
BNDG CMPR 9X6 STRL LF SNTH (GAUZE/BANDAGES/DRESSINGS) ×2
BNDG ELASTIC 4X5.8 VLCR STR LF (GAUZE/BANDAGES/DRESSINGS) ×2 IMPLANT
BNDG ELASTIC 6X5.8 VLCR STR LF (GAUZE/BANDAGES/DRESSINGS) ×3 IMPLANT
BNDG ESMARK 6X9 LF (GAUZE/BANDAGES/DRESSINGS) ×6
BOWL SMART MIX CTS (DISPOSABLE) ×1 IMPLANT
CLOSURE WOUND 1/2 X4 (GAUZE/BANDAGES/DRESSINGS) ×2
COVER SURGICAL LIGHT HANDLE (MISCELLANEOUS) ×3 IMPLANT
COVER WAND RF STERILE (DRAPES) ×3 IMPLANT
CUFF TOURN SGL QUICK 34 (TOURNIQUET CUFF) ×3
CUFF TOURN SGL QUICK 42 (TOURNIQUET CUFF) IMPLANT
CUFF TRNQT CYL 34X4.125X (TOURNIQUET CUFF) IMPLANT
DERMABOND ADVANCED (GAUZE/BANDAGES/DRESSINGS) ×2
DERMABOND ADVANCED .7 DNX12 (GAUZE/BANDAGES/DRESSINGS) IMPLANT
DRAPE HALF SHEET 40X57 (DRAPES) ×3 IMPLANT
DRAPE IMP U-DRAPE 54X76 (DRAPES) ×3 IMPLANT
DRAPE POUCH INSTRU U-SHP 10X18 (DRAPES) ×3 IMPLANT
DRAPE U-SHAPE 47X51 STRL (DRAPES) ×3 IMPLANT
DRSG ADAPTIC 3X8 NADH LF (GAUZE/BANDAGES/DRESSINGS) ×3 IMPLANT
DRSG AQUACEL AG ADV 3.5X10 (GAUZE/BANDAGES/DRESSINGS) ×2 IMPLANT
DRSG PAD ABDOMINAL 8X10 ST (GAUZE/BANDAGES/DRESSINGS) ×6 IMPLANT
ELECT REM PT RETURN 9FT ADLT (ELECTROSURGICAL) ×3
ELECTRODE REM PT RTRN 9FT ADLT (ELECTROSURGICAL) ×1 IMPLANT
FACESHIELD STD STERILE (MASK) ×6 IMPLANT
FACESHIELD WRAPAROUND (MASK) ×3 IMPLANT
FACESHIELD WRAPAROUND OR TEAM (MASK) ×1 IMPLANT
GAUZE SPONGE 4X4 12PLY STRL (GAUZE/BANDAGES/DRESSINGS) ×3 IMPLANT
GLOVE BIOGEL PI IND STRL 7.5 (GLOVE) ×1 IMPLANT
GLOVE BIOGEL PI IND STRL 8 (GLOVE) ×1 IMPLANT
GLOVE BIOGEL PI INDICATOR 7.5 (GLOVE) ×2
GLOVE BIOGEL PI INDICATOR 8 (GLOVE) ×2
GLOVE ORTHO TXT STRL SZ7.5 (GLOVE) ×3 IMPLANT
GLOVE SURG ORTHO 8.0 STRL STRW (GLOVE) ×1 IMPLANT
GOWN STRL REUS W/ TWL LRG LVL3 (GOWN DISPOSABLE) ×2 IMPLANT
GOWN STRL REUS W/TWL LRG LVL3 (GOWN DISPOSABLE) ×6
HANDPIECE INTERPULSE COAX TIP (DISPOSABLE) ×3
IMMOBILIZER KNEE 20 (SOFTGOODS) ×3
IMMOBILIZER KNEE 20 THIGH 36 (SOFTGOODS) IMPLANT
IMMOBILIZER KNEE 22 UNIV (SOFTGOODS) ×3 IMPLANT
IMMOBILIZER KNEE 24 THIGH 36 (MISCELLANEOUS) IMPLANT
IMMOBILIZER KNEE 24 UNIV (MISCELLANEOUS)
JET LAVAGE IRRISEPT WOUND (IRRIGATION / IRRIGATOR) ×3
KIT BASIN OR (CUSTOM PROCEDURE TRAY) ×3 IMPLANT
KIT TURNOVER KIT B (KITS) ×3 IMPLANT
LAVAGE JET IRRISEPT WOUND (IRRIGATION / IRRIGATOR) IMPLANT
MANIFOLD NEPTUNE II (INSTRUMENTS) ×3 IMPLANT
MARKER SPHERE PSV REFLC THRD 5 (MARKER) ×3 IMPLANT
NDL 18GX1X1/2 (RX/OR ONLY) (NEEDLE) ×2 IMPLANT
NEEDLE 18GX1X1/2 (RX/OR ONLY) (NEEDLE) ×6 IMPLANT
NS IRRIG 1000ML POUR BTL (IV SOLUTION) ×3 IMPLANT
PACK TOTAL JOINT (CUSTOM PROCEDURE TRAY) ×3 IMPLANT
PACK UNIVERSAL I (CUSTOM PROCEDURE TRAY) ×3 IMPLANT
PAD ARMBOARD 7.5X6 YLW CONV (MISCELLANEOUS) ×6 IMPLANT
PADDING CAST COTTON 6X4 STRL (CAST SUPPLIES) ×6 IMPLANT
PIN SCHANZ 4MM 130MM (PIN) IMPLANT
SET HNDPC FAN SPRY TIP SCT (DISPOSABLE) ×1 IMPLANT
SPONGE LAP 18X18 RF (DISPOSABLE) IMPLANT
STRIP CLOSURE SKIN 1/2X4 (GAUZE/BANDAGES/DRESSINGS) ×4 IMPLANT
SUCTION FRAZIER HANDLE 10FR (MISCELLANEOUS) ×2
SUCTION TUBE FRAZIER 10FR DISP (MISCELLANEOUS) ×1 IMPLANT
SUT DVC VLOC 180 0 12IN GS21 (SUTURE) ×3
SUT MNCRL AB 3-0 PS2 18 (SUTURE) ×2 IMPLANT
SUT MNCRL AB 4-0 PS2 18 (SUTURE) ×3 IMPLANT
SUT VIC AB 1 CT1 27 (SUTURE) ×3
SUT VIC AB 1 CT1 27XBRD ANBCTR (SUTURE) ×3 IMPLANT
SUT VIC AB 2-0 CT1 27 (SUTURE) ×6
SUT VIC AB 2-0 CT1 TAPERPNT 27 (SUTURE) ×3 IMPLANT
SUT VLOC 180 0 24IN GS25 (SUTURE) ×2 IMPLANT
SUTURE DVC VLC 180 0 12IN GS21 (SUTURE) IMPLANT
SYR 3ML LL SCALE MARK (SYRINGE) ×3 IMPLANT
SYR 50ML SLIP (SYRINGE) ×3 IMPLANT
TOWEL GREEN STERILE (TOWEL DISPOSABLE) ×3 IMPLANT
TOWEL GREEN STERILE FF (TOWEL DISPOSABLE) ×3 IMPLANT
TRAY FOLEY MTR SLVR 16FR STAT (SET/KITS/TRAYS/PACK) ×3 IMPLANT
WATER STERILE IRR 1000ML POUR (IV SOLUTION) ×3 IMPLANT
WRAP KNEE MAXI GEL POST OP (GAUZE/BANDAGES/DRESSINGS) ×3 IMPLANT

## 2019-07-13 NOTE — Progress Notes (Signed)
*   Echocardiogram 2D Echocardiogram has been performed.  Darlina Sicilian M 07/13/2019, 7:54 AM

## 2019-07-13 NOTE — Brief Op Note (Signed)
07/12/2019 - 07/13/2019  7:51 PM  PATIENT:  Vernon Frye  58 y.o. male  PRE-OPERATIVE DIAGNOSIS:  Septic left total knee replacement  POST-OPERATIVE DIAGNOSIS:  Septic left total knee replacement  PROCEDURE:  Procedure(s): IRRIGATION AND DEBRIDEMENT TOTAL  KNEE WITH POLY EXCHANGE (Left)  SURGEON:  Surgeon(s) and Role:    Paralee Cancel, MD - Primary  PHYSICIAN ASSISTANT: None  ANESTHESIA:   general  EBL:  <100 cc  BLOOD ADMINISTERED:none  DRAINS: none   LOCAL MEDICATIONS USED:  NONE  SPECIMEN:  No Specimen  DISPOSITION OF SPECIMEN:  N/A  COUNTS:  YES  TOURNIQUET:  50 min at 250 mmHg  DICTATION: .Other Dictation: Dictation Number [id number]  PLAN OF CARE: Admit to inpatient   PATIENT DISPOSITION:  PACU - guarded condition.   Delay start of Pharmacological VTE agent (>24hrs) due to surgical blood loss or risk of bleeding: no

## 2019-07-13 NOTE — ED Notes (Signed)
Increased pt's 02 to 3L due to 02 sat previously being 91% on 2L. Will continue to monitor

## 2019-07-13 NOTE — ED Notes (Addendum)
Walked into patients room at [phone] and noticed that his neck and face area were very red. Informed MD about pt's reaction and told to stop Vancomycin infusion and notify pharmacy. Also paged MD due to pt's respiratory status worsening. Pt having labored breathing and respirations at 32. MD stated he would come to bedside to assess patient. Orders placed

## 2019-07-13 NOTE — Progress Notes (Addendum)
PROGRESS NOTE   Vernon Frye  TDV:[id number]    DOB: 1961-02-12    DOA: 07/12/2019  PCP: Nicholes Rough, PA-C   I have briefly reviewed patients previous medical records in [facility].  Chief Complaint:   Chief Complaint  Patient presents with  . Knee Pain    Brief Narrative:  58 year old male, lives alone, reportedly independent, poor historian, PMH of A. fib not on Eliquis because cannot afford, essential hypertension, anxiety, alcohol use, left total knee arthroplasty 2016, presented to [facility] ED on 07/12/2019 due to 1 day history of severe left knee pain, swelling, redness and unable to weight-bear.  Admitted for severe sepsis with multiple organ dysfunction, sepsis possibly due to left knee septic arthritis/prosthetic joint infection, A. fib with RVR, acute renal failure, lactic acidosis, acute respiratory failure with hypoxia, hypotension and possible toxic metabolic encephalopathy.  Developed allergic reaction/?  "red man" syndrome to vancomycin.  Due to multiorgan dysfunction and concern for further decompensation, CCM was consulted on 12/16 and patient transferred under their care to ICU.  Orthopedic consulted and input pending.   Assessment & Plan:  Principal Problem:   Septic joint ([place]) Active Problems:   Atrial fibrillation with rapid ventricular response (HCC)   AKI (acute kidney injury) ([place])   Hyponatremia   Severe sepsis (HCC)   Severe sepsis with acute organ dysfunction (HCC)   Respiratory insufficiency   Severe sepsis with multiorgan failure, suspected due to left knee septic arthritis/prosthetic joint infection.  Met sepsis criteria on admission including tachycardia, tachypnea, hypotension, leukocytosis, suspected left knee source and lactic acidosis.  Respiratory panel PCR including influenza A & B and Covid testing negative.  Urine microscopy: Bacteriuria but no pyuria, UTI less likely.  Repeat chest x-ray personally reviewed this morning and suggestive of  some pulmonary edema.  Bilateral lower extremity venous Dopplers negative for DVT.  CRP 31.6.  ESR 57.  Procalcitonin 8.28.  Lactate improved from 4.2 > 2.4.  Plasma cortisol: 25.2.  X-ray of left knee shows moderate joint effusion without acute bony abnormality.  Blood cultures and urine culture pending, follow results.  Patient was treated with aggressive IV fluid (3 L in the ED) followed by maintenance IV fluids.  However due to concern for pulmonary edema, IV fluids reduced to 75 mL/h.  He had been empirically started on IV vancomycin and ceftriaxone for septic arthritis.  However she developed skin rash concerning for "red man" syndrome and hence PCCM has changed antibiotics to IV Zosyn and daptomycin.  Orthopedic input pending, may require diagnostic arthrocentesis and/or left knee washout pending clinical stability  A. fib with RVR  Likely precipitated by severe sepsis.  Not sure what medications patient takes at home, home medications have not been reconciled by pharmacy yet.  Unsure if he takes Toprol-XL.  Does not take Eliquis due to unable to afford.  Initially treated with IV Cardizem drip and was up to 10 mg but still had RVR up to 120s-150s, soft blood pressures with SBP in the 90s-100s.  Thereby he has been switched to IV amiodarone drip and has been started on IV heparin.  I consulted Cardiology this morning, input pending.  No TTE in system, ordered and report pending.   CHA2DS2-VASc score: 1.  Defer to cardiology regarding antiplatelets alone versus anticoagulation.  Acute renal failure/acute urinary retention  Creatinine was 1.1 in February 2020.  Presented with BUN of 43 and creatinine of 3.3.  Likely multifactorial related to severe sepsis with hypotension, possible ATN,  ongoing ACEI/diuretic use, urinary retention with mild left hydronephrosis.  Bladder scan in ED showed >450 mL.  Renal ultrasound showed mild left hydronephrosis.  If patient unable to void,  in and out bladder catheterization x3 and if still fails to void then place indwelling Foley catheter.  Strict intake output, avoid nephrotoxic's.  Trend daily BMP and if creatinine not improving or worsens, consider nephrology consultation.  Supportive treatment for hypotension.  Lactic acidosis  Secondary to severe sepsis, dehydration and hypotension.  IV fluids as noted above.  Improved.  Acute respiratory failure with hypoxia  Noted in the ED this morning when he became tachypneic in the 30s and desaturated to 88% and placed on 3 L/min oxygen via nasal cannula.  No stridor and likely not due to allergic reaction.  Concern for volume overload/pulmonary edema complicating possible OSA.  Reduced IV fluids, oxygen supplementation and CCM consulted.  As per CCM, has mild glossitis, sore throat, tonsillar exudate, flu panel and Covid testing negative, strep a sent, at risk for intubation and hence have kept him n.p.o.  Allergic drug reaction/?  "red man" syndrome.  Noted approximately 45 minutes to an hour after IV vancomycin started.  Vancomycin discontinued.  Treated with IV Pepcid, Benadryl and received a dose of Solu-Medrol.  No features of airway compromise.  Alcohol dependence  CIWA protocol.  At risk for DTs.  UDS positive for amphetamines, benzodiazepines and opioids.  Patient reportedly on opioids and benzodiazepines at home.  Consider starting some benzodiazepines to avoid withdrawal seizures.  Hypomagnesemia  Magnesium 1.  Replace IV and follow magnesium.  Hyponatremia  Suspect due to dehydration and HCTZ use.  Trend daily BMP.  Body mass index is 32.52 kg/m./Obesity  ?  Acute toxic metabolic encephalopathy  Baseline mental status not clear.  No focal deficits.  May be multifactorial due to severe sepsis, hypoxia etc.  Delirium precautions and monitor closely.  Anemia  May be dilutional versus critical illness.  Follow daily CBCs.  Essential  hypertension  Hypotensive this morning.  Better.   DVT prophylaxis: Currently on IV heparin infusion Code Status: Full Family Communication: None at bedside Disposition: Admission change from progressive unit to ICU.  Disposition to be determined pending clinical improvement.   Consultants:   PCCM Cardiology  Procedures:   None  Antimicrobials:   IV vancomycin and ceftriaxone discontinued. IV Zosyn and daptomycin 12/16>   Subjective:  Patient seen this morning while still in the ED.  ED RN paged this MD a little before 7 AM due to hypotension, tachypnea, hypoxia and blanching neck skin rash.  I evaluated patient in the ED along with RN in room.  He reported ongoing left knee swelling and pain but denied chest pain or dyspnea.  Was not keen on Foley catheter.  Objective:   Vitals:   07/13/19 0830 07/13/19 0900 07/13/19 1056 07/13/19 1100  BP: 105/69  116/79 103/77  Pulse: (!) 120  (!) 125 (!) 116  Resp: (!) 30  (!) 32 19  Temp:   (!) 97.3 F (36.3 C)   TempSrc:   Oral   SpO2: 100%  95% 96%  Weight:  118 kg    Height:  '6\' 3"'$  (1.905 m)      General exam: Middle-age male, moderately built and obese lying propped up in bed, slightly ill looking and mildly tachypneic. Respiratory system: Occasional bibasilar crackles but otherwise clear to auscultation without wheezing, rhonchi or stridor.  Mildly tachypneic. Cardiovascular system: S1 & S2 heard, irregularly irregular  and tachycardic. No JVD, murmurs, rubs, gallops or clicks.  1+ pitting bilateral leg edema.  Telemetry personally reviewed: A. fib with RVR mostly in the 120s but at times up to 150s. Gastrointestinal system: Abdomen is nondistended, soft and nontender. No organomegaly or masses felt. Normal bowel sounds heard. Central nervous system: Alert and oriented to person, place and partly to time.. No focal neurological deficits. Extremities: Symmetric 5 x 5 power.  Left lower extremity movements restricted due to pain.   Left knee TKA scar, swollen, mildly warm, mild patchy erythema, tender to touch but no crepitus. Skin: No rashes, lesions or ulcers.  Patient has multiple tattoos on his right upper extremity. Psychiatry: Judgement and insight appear impaired. Mood & affect appropriate.     Data Reviewed:   I have personally reviewed following labs and imaging studies   CBC: Recent Labs  Lab 07/12/19 1910 07/13/19 0431  WBC 16.0* 12.9*  NEUTROABS 13.5*  --   HGB 13.3 11.5*  HCT 40.3 34.8*  MCV 88.6 88.1  PLT 227 546    Basic Metabolic Panel: Recent Labs  Lab 07/12/19 1910 07/13/19 0431  NA 130* 130*  K 3.6 3.8  CL 91* 95*  CO2 22 21*  GLUCOSE 122* 132*  BUN 43* 44*  CREATININE 3.36* 3.08*  CALCIUM 8.7* 8.1*  MG  --  1.0*  PHOS  --  3.1    Liver Function Tests: Recent Labs  Lab 07/12/19 1910 07/13/19 0431  AST 29 24  ALT 17 14  ALKPHOS 94 68  BILITOT 1.9* 1.6*  PROT 6.9 5.3*  ALBUMIN 2.5* 1.9*    CBG: Recent Labs  Lab 07/13/19 1058  GLUCAP 79    Microbiology Studies:   Recent Results (from the past 240 hour(s))  SARS CORONAVIRUS 2 (TAT 6-24 HRS) Nasopharyngeal Nasopharyngeal Swab     Status: None   Collection Time: 07/13/19  2:57 AM   Specimen: Nasopharyngeal Swab  Result Value Ref Range Status   SARS Coronavirus 2 NEGATIVE NEGATIVE Final    Comment: (NOTE) SARS-CoV-2 target nucleic acids are NOT DETECTED. The SARS-CoV-2 RNA is generally detectable in upper and lower respiratory specimens during the acute phase of infection. Negative results do not preclude SARS-CoV-2 infection, do not rule out co-infections with other pathogens, and should not be used as the sole basis for treatment or other patient management decisions. Negative results must be combined with clinical observations, patient history, and epidemiological information. The expected result is Negative. Fact Sheet for Patients: SugarRoll.be Fact Sheet for Healthcare  Providers: https://www.woods-mathews.com/ This test is not yet approved or cleared by the [place] FDA and  has been authorized for detection and/or diagnosis of SARS-CoV-2 by FDA under an Emergency Use Authorization (EUA). This EUA will remain  in effect (meaning this test can be used) for the duration of the COVID-19 declaration under Section 56 4(b)(1) of the Act, 21 U.S.C. section 360bbb-3(b)(1), unless the authorization is terminated or revoked sooner. Performed at [facility], [place] [address], [place], [place] [address]   Respiratory Panel by RT PCR (Flu A&B, Covid) - Nasopharyngeal Swab     Status: None   Collection Time: 07/13/19  6:56 AM   Specimen: Nasopharyngeal Swab  Result Value Ref Range Status   SARS Coronavirus 2 by RT PCR NEGATIVE NEGATIVE Final    Comment: (NOTE) SARS-CoV-2 target nucleic acids are NOT DETECTED. The SARS-CoV-2 RNA is generally detectable in upper respiratoy specimens during the acute phase of infection. The lowest  concentration of SARS-CoV-2 viral copies this assay can detect is 131 copies/mL. A negative result does not preclude SARS-Cov-2 infection and should not be used as the sole basis for treatment or other patient management decisions. A negative result may occur with  improper specimen collection/handling, submission of specimen other than nasopharyngeal swab, presence of viral mutation(s) within the areas targeted by this assay, and inadequate number of viral copies (<131 copies/mL). A negative result must be combined with clinical observations, patient history, and epidemiological information. The expected result is Negative. Fact Sheet for Patients:  PinkCheek.be Fact Sheet for Healthcare Providers:  GravelBags.it This test is not yet ap proved or cleared by the [place] FDA and  has been authorized for detection and/or diagnosis of SARS-CoV-2 by FDA  under an Emergency Use Authorization (EUA). This EUA will remain  in effect (meaning this test can be used) for the duration of the COVID-19 declaration under Section 564(b)(1) of the Act, 21 U.S.C. section 360bbb-3(b)(1), unless the authorization is terminated or revoked sooner.    Influenza A by PCR NEGATIVE NEGATIVE Final   Influenza B by PCR NEGATIVE NEGATIVE Final    Comment: (NOTE) The Xpert Xpress SARS-CoV-2/FLU/RSV assay is intended as an aid in  the diagnosis of influenza from Nasopharyngeal swab specimens and  should not be used as a sole basis for treatment. Nasal washings and  aspirates are unacceptable for Xpert Xpress SARS-CoV-2/FLU/RSV  testing. Fact Sheet for Patients: PinkCheek.be Fact Sheet for Healthcare Providers: GravelBags.it This test is not yet approved or cleared by the [place] FDA and  has been authorized for detection and/or diagnosis of SARS-CoV-2 by  FDA under an Emergency Use Authorization (EUA). This EUA will remain  in effect (meaning this test can be used) for the duration of the  Covid-19 declaration under Section 564(b)(1) of the Act, 21  U.S.C. section 360bbb-3(b)(1), unless the authorization is  terminated or revoked. Performed at [facility], [place] [address], [place], [place] [address]      Radiology Studies:  US Renal  Result Date: 22-Jul-2019 CLINICAL DATA:  Acute renal injury EXAM: RENAL / URINARY TRACT ULTRASOUND COMPLETE COMPARISON:  None. FINDINGS: Right Kidney: Renal measurements: 14.7 x 6.1 x 6.6 cm. = volume: 310 mL . Echogenicity within normal limits. No mass or hydronephrosis visualized. Left Kidney: Renal measurements: 15.0 x 7.2 x 6.9 cm = volume: 385 mL. Mild hydronephrosis is noted with a prominent extrarenal pelvis. Bladder: Appears normal for degree of bladder distention. Other: None. IMPRESSION: Mild left-sided hydronephrosis is noted. Electronically Signed    By: Inez Catalina M.D.   On: 07/22/2019 10:40   DG Chest Port 1 View  Result Date: Jul 22, 2019 CLINICAL DATA:  Atrial fibrillation, hypoxia EXAM: PORTABLE CHEST 1 VIEW COMPARISON:  09/16/2018 FINDINGS: Lungs are clear. No pleural effusion or pneumothorax. Top-normal heart size. IMPRESSION: No acute process in the chest. Electronically Signed   By: Macy Mis M.D.   On: 22-Jul-2019 07:29   DG Knee Complete 4 Views Left  Result Date: 07/12/2019 CLINICAL DATA:  Knee swelling and redness EXAM: LEFT KNEE - COMPLETE 4+ VIEW COMPARISON:  06/29/2015 FINDINGS: Prior left knee replacement, stable. Moderate joint effusion. No hardware complicating feature. No fracture, subluxation or dislocation. IMPRESSION: Prior left knee replacement. Moderate joint effusion. No acute bony abnormality. Electronically Signed   By: Rolm Baptise M.D.   On: 07/12/2019 19:30   ECHOCARDIOGRAM COMPLETE  Result Date: 07-22-19   ECHOCARDIOGRAM REPORT   Patient Name:  Vernon Frye Date of Exam: 07/13/2019 Medical Rec #:  [id number]       Height:       75.0 in Accession #:    [id number]      Weight:       260.0 lb Date of Birth:  May 28, 1961      BSA:          2.45 m Patient Age:    86 years        BP:           101/68 mmHg Patient Gender: M               HR:           124 bpm. Exam Location:  Inpatient Procedure: 2D Echo STAT ECHO Indications:    Atrial Fibrillation 427.31/ I48.91  History:        Patient has no prior history of Echocardiogram examinations.                 Risk Factors:Hypertension. Left knee sepitc joint, acute kidney                 injury, allergic reaction to antibiotic, COVID-19 test pending,.  Sonographer:    Darlina Sicilian RDCS Referring Phys: [phone] Bryttney Netzer D Levert Heslop  Sonographer Comments: Patient condition.  LEFT VENTRICLE PLAX 2D LVIDd:         4.20 cm LVIDs:         2.90 cm LV PW:         1.50 cm LV IVS:        1.40 cm LVOT diam:     1.90 cm LV SV:         46 ml LV SV Index:   18.34 LVOT Area:     2.84 cm   LEFT ATRIUM            Index LA diam:      4.00 cm  1.63 cm/m LA Vol (A4C): 104.0 ml 42.39 ml/m   AORTA Ao Root diam: 2.70 cm MITRAL VALVE MV Area (PHT): 5.34 cm            SHUNTS MV PHT:        41.18 msec          Systemic Diam: 1.90 cm MV Decel Time: 142 msec MV E velocity: 88.83 cm/s 103 cm/s  Electronically signed by Signature Date/Time: /    Preliminary    VAS [place] LOWER EXTREMITY VENOUS (DVT)  Result Date: 07/13/2019  Lower Venous Study Indications: Edema.  Comparison Study: no prior Performing Technologist: Abram Sander RVS  Examination Guidelines: A complete evaluation includes B-mode imaging, spectral Doppler, color Doppler, and power Doppler as needed of all accessible portions of each vessel. Bilateral testing is considered an integral part of a complete examination. Limited examinations for reoccurring indications may be performed as noted.  +---------+---------------+---------+-----------+----------+--------------+ RIGHT    CompressibilityPhasicitySpontaneityPropertiesThrombus Aging +---------+---------------+---------+-----------+----------+--------------+ CFV      Full           Yes      Yes                                 +---------+---------------+---------+-----------+----------+--------------+ SFJ      Full                                                        +---------+---------------+---------+-----------+----------+--------------+  FV Prox  Full                                                        +---------+---------------+---------+-----------+----------+--------------+ FV Mid   Full                                                        +---------+---------------+---------+-----------+----------+--------------+ FV DistalFull                                                        +---------+---------------+---------+-----------+----------+--------------+ PFV      Full                                                         +---------+---------------+---------+-----------+----------+--------------+ POP      Full           Yes      Yes                                 +---------+---------------+---------+-----------+----------+--------------+ PTV      Full                                                        +---------+---------------+---------+-----------+----------+--------------+ PERO     Full                                                        +---------+---------------+---------+-----------+----------+--------------+   +---------+---------------+---------+-----------+----------+--------------+ LEFT     CompressibilityPhasicitySpontaneityPropertiesThrombus Aging +---------+---------------+---------+-----------+----------+--------------+ CFV      Full           Yes      Yes                                 +---------+---------------+---------+-----------+----------+--------------+ SFJ      Full                                                        +---------+---------------+---------+-----------+----------+--------------+ FV Prox  Full                                                        +---------+---------------+---------+-----------+----------+--------------+  FV Mid   Full                                                        +---------+---------------+---------+-----------+----------+--------------+ FV DistalFull                                                        +---------+---------------+---------+-----------+----------+--------------+ PFV      Full                                                        +---------+---------------+---------+-----------+----------+--------------+ POP      Full           Yes      Yes                                 +---------+---------------+---------+-----------+----------+--------------+ PTV      Full                                                         +---------+---------------+---------+-----------+----------+--------------+ PERO                                                  Not visualized +---------+---------------+---------+-----------+----------+--------------+     Summary: Right: There is no evidence of deep vein thrombosis in the lower extremity. No cystic structure found in the popliteal fossa. Left: There is no evidence of deep vein thrombosis in the lower extremity. No cystic structure found in the popliteal fossa.  *See table(s) above for measurements and observations. Electronically signed by Monica Martinez MD on 07/13/2019 at 12:52:52 PM.    Final      Scheduled Meds:   . Chlorhexidine Gluconate Cloth  6 each Topical Daily  . folic acid  1 mg Oral Daily  . lidocaine-EPINEPHrine  20 mL Intradermal Once  . multivitamin with minerals  1 tablet Oral Daily  . thiamine  100 mg Oral Daily   Or  . thiamine  100 mg Intravenous Daily    Continuous Infusions:   . sodium chloride 75 mL/hr at 07/13/19 0819  . amiodarone 60 mg/hr (07/13/19 1100)   Followed by  . amiodarone    . famotidine (PEPCID) IV Stopped (07/13/19 0957)  . heparin 1,500 Units/hr (07/13/19 1152)  . magnesium sulfate bolus IVPB 4 g (07/13/19 1152)  . piperacillin-tazobactam (ZOSYN)  IV       LOS: 0 days     Vernell Leep, MD, [place], [facility]. [facility]    To contact the attending provider between 7A-7P or the covering provider during after hours 7P-7A, please log into the web site [email or web address] and access using  universal [place] password for that web site. If you do not have the password, please call the hospital operator.  07/13/2019, 1:04 PM

## 2019-07-13 NOTE — Anesthesia Procedure Notes (Signed)
Procedure Name: Intubation Date/Time: 07/13/2019 8:08 PM Performed by: Valetta Fuller, CRNA Pre-anesthesia Checklist: Patient identified, Emergency Drugs available, Suction available and Patient being monitored Patient Re-evaluated:Patient Re-evaluated prior to induction Oxygen Delivery Method: Circle system utilized Preoxygenation: Pre-oxygenation with 100% oxygen Induction Type: IV induction, Rapid sequence and Cricoid Pressure applied Laryngoscope Size: Miller and 2 Grade View: Grade I Tube type: Oral Tube size: 7.5 mm Number of attempts: 1 Airway Equipment and Method: Stylet Placement Confirmation: ETT inserted through vocal cords under direct vision,  positive ETCO2 and breath sounds checked- equal and bilateral Secured at: 23 cm Tube secured with: Tape Dental Injury: Teeth and Oropharynx as per pre-operative assessment

## 2019-07-13 NOTE — Consult Note (Signed)
Reason for Consult: Septic left total knee replacement Referring Physician: Nelda Marseille, MD  Vernon Frye is an 58 y.o. male.  HPI: 58 yo male with history of left TKR 2016.  He states his knee was doing very until about 2 days ago.  No recent procedures or noted injuries.  No recent health related concerns Presented to the ER with septic presentation last night and admitted to ICU. Afib with rvr Acute renal injury as well  Past Medical History:  Diagnosis Date  . Anxiety    takes Xanax daily  . Arthritis   . Chronic back pain    DDD  . Complication of anesthesia    agitated when awaking   . H/O rotator cuff tear   . History of kidney stones   . Hypertension    takes Toprol,Prinizide,and Amlodipine daily  . Joint pain   . Joint swelling   . Laceration    history of to right lower extermity   . Night muscle spasms    takes Soma daily  . Numbness and tingling    hands and feet   . Pneumonia    history of   . Tinnitus     Past Surgical History:  Procedure Laterality Date  . HERNIA REPAIR Bilateral as a baby   inguinal-  . LITHOTRIPSY    . SHOULDER ARTHROSCOPY WITH SUBACROMIAL DECOMPRESSION AND OPEN ROTATOR C Left 01/16/2014   Procedure: LEFT SHOULDER ARTHROSCOPY WITH SUBACROMIAL DECOMPRESSION AND MINI OPEN ROTATOR CUFF REPAIR, OPEN DISTAL CLAVICLE RESECTION AND TENDODESIS;  Surgeon: Augustin Schooling, MD;  Location: [facility];  Service: Orthopedics;  Laterality: Left;  . tens unit     for severe pain   . TOTAL KNEE ARTHROPLASTY Left 06/19/2015   Procedure: LEFT TOTAL KNEE ARTHROPLASTY;  Surgeon: Paralee Cancel, MD;  Location: [facility];  Service: Orthopedics;  Laterality: Left;    Family History  Problem Relation Age of Onset  . Heart disease Other     Social History:  reports that he quit smoking about 14 years ago. His smoking use included cigarettes. He has a 1.75 pack-year smoking history. He has never used smokeless tobacco. He reports current alcohol use. He reports that he  does not use drugs.  Allergies: No Known Allergies  Medications:  I have reviewed the patient's current medications. Scheduled: . Chlorhexidine Gluconate Cloth  6 each Topical Daily  . folic acid  1 mg Oral Daily  . lidocaine-EPINEPHrine  20 mL Intradermal Once  . multivitamin with minerals  1 tablet Oral Daily  . thiamine  100 mg Oral Daily   Or  . thiamine  100 mg Intravenous Daily    Results for orders placed or performed during the hospital encounter of 07/12/19 (from the past 24 hour(s))  Lactic acid, plasma     Status: Abnormal   Collection Time: 07/12/19  7:10 PM  Result Value Ref Range   Lactic Acid, Venous 4.2 (HH) 0.5 - 1.9 mmol/L  Comprehensive metabolic panel     Status: Abnormal   Collection Time: 07/12/19  7:10 PM  Result Value Ref Range   Sodium 130 (L) 135 - 145 mmol/L   Potassium 3.6 3.5 - 5.1 mmol/L   Chloride 91 (L) 98 - 111 mmol/L   CO2 22 22 - 32 mmol/L   Glucose, Bld 122 (H) 70 - 99 mg/dL   BUN 43 (H) 6 - 20 mg/dL   Creatinine, Ser 3.36 (H) 0.61 - 1.24 mg/dL   Calcium 8.7 (L)  8.9 - 10.3 mg/dL   Total Protein 6.9 6.5 - 8.1 g/dL   Albumin 2.5 (L) 3.5 - 5.0 g/dL   AST 29 15 - 41 U/L   ALT 17 0 - 44 U/L   Alkaline Phosphatase 94 38 - 126 U/L   Total Bilirubin 1.9 (H) 0.3 - 1.2 mg/dL   GFR calc non Af Amer 19 (L) >60 mL/min   GFR calc Af Amer 22 (L) >60 mL/min   Anion gap 17 (H) 5 - 15  CBC with Differential     Status: Abnormal   Collection Time: 07/12/19  7:10 PM  Result Value Ref Range   WBC 16.0 (H) 4.0 - 10.5 K/uL   RBC 4.55 4.22 - 5.81 MIL/uL   Hemoglobin 13.3 13.0 - 17.0 g/dL   HCT 40.3 39.0 - 52.0 %   MCV 88.6 80.0 - 100.0 fL   MCH 29.2 26.0 - 34.0 pg   MCHC 33.0 30.0 - 36.0 g/dL   RDW 14.0 11.5 - 15.5 %   Platelets 227 150 - 400 K/uL   nRBC 0.0 0.0 - 0.2 %   Neutrophils Relative % 83 %   Neutro Abs 13.5 (H) 1.7 - 7.7 K/uL   Lymphocytes Relative 4 %   Lymphs Abs 0.6 (L) 0.7 - 4.0 K/uL   Monocytes Relative 6 %   Monocytes Absolute 1.0  0.1 - 1.0 K/uL   Eosinophils Relative 1 %   Eosinophils Absolute 0.1 0.0 - 0.5 K/uL   Basophils Relative 0 %   Basophils Absolute 0.0 0.0 - 0.1 K/uL   Immature Granulocytes 6 %   Abs Immature Granulocytes 0.91 (H) 0.00 - 0.07 K/uL  Blood Culture (routine x 2)     Status: None (Preliminary result)   Collection Time: 07/13/19  1:10 AM   Specimen: BLOOD  Result Value Ref Range   Specimen Description BLOOD RIGHT ANTECUBITAL    Special Requests      BOTTLES DRAWN AEROBIC AND ANAEROBIC Blood Culture adequate volume   Culture  Setup Time      GRAM POSITIVE COCCI IN CLUSTERS IN BOTH AEROBIC AND ANAEROBIC BOTTLES CRITICAL RESULT CALLED TO, READ BACK BY AND VERIFIED WITH: Salome Holmes [facility] [id number] 07/13/19 A BROWNING Performed at [facility], [address] [place], [place] [address]    Culture GRAM POSITIVE COCCI    Report Status PENDING   Blood Culture (routine x 2)     Status: None (Preliminary result)   Collection Time: 07/13/19  1:14 AM   Specimen: BLOOD  Result Value Ref Range   Specimen Description BLOOD LEFT ANTECUBITAL    Special Requests      BOTTLES DRAWN AEROBIC AND ANAEROBIC Blood Culture adequate volume   Culture  Setup Time      GRAM POSITIVE COCCI IN CLUSTERS IN BOTH AEROBIC AND ANAEROBIC BOTTLES Organism ID to follow CRITICAL RESULT CALLED TO, READ BACK BY AND VERIFIED WITH: Salome Holmes [facility] [id number] 07/13/19 A BROWNING Performed at [facility], [place] [address], [place], [place] [address]    Culture GRAM POSITIVE COCCI    Report Status PENDING   Blood Culture ID Panel (Reflexed)     Status: Abnormal   Collection Time: 07/13/19  1:14 AM  Result Value Ref Range   Enterococcus species NOT DETECTED NOT DETECTED   Listeria monocytogenes NOT DETECTED NOT DETECTED   Staphylococcus species DETECTED (A) NOT DETECTED   Staphylococcus aureus (BCID) DETECTED (A) NOT DETECTED   Methicillin resistance DETECTED (  A) NOT DETECTED   Streptococcus species NOT DETECTED NOT  DETECTED   Streptococcus agalactiae NOT DETECTED NOT DETECTED   Streptococcus pneumoniae NOT DETECTED NOT DETECTED   Streptococcus pyogenes NOT DETECTED NOT DETECTED   Acinetobacter baumannii NOT DETECTED NOT DETECTED   Enterobacteriaceae species NOT DETECTED NOT DETECTED   Enterobacter cloacae complex NOT DETECTED NOT DETECTED   Escherichia coli NOT DETECTED NOT DETECTED   Klebsiella oxytoca NOT DETECTED NOT DETECTED   Klebsiella pneumoniae NOT DETECTED NOT DETECTED   Proteus species NOT DETECTED NOT DETECTED   Serratia marcescens NOT DETECTED NOT DETECTED   Haemophilus influenzae NOT DETECTED NOT DETECTED   Neisseria meningitidis NOT DETECTED NOT DETECTED   Pseudomonas aeruginosa NOT DETECTED NOT DETECTED   Candida albicans NOT DETECTED NOT DETECTED   Candida glabrata NOT DETECTED NOT DETECTED   Candida krusei NOT DETECTED NOT DETECTED   Candida parapsilosis NOT DETECTED NOT DETECTED   Candida tropicalis NOT DETECTED NOT DETECTED  Sedimentation rate     Status: Abnormal   Collection Time: 07/13/19  1:17 AM  Result Value Ref Range   Sed Rate 57 (H) 0 - 16 mm/hr  C-reactive protein     Status: Abnormal   Collection Time: 07/13/19  1:17 AM  Result Value Ref Range   CRP 31.6 (H) <1.0 mg/dL  APTT     Status: None   Collection Time: 07/13/19  1:17 AM  Result Value Ref Range   aPTT 30 24 - 36 seconds  Protime-INR     Status: Abnormal   Collection Time: 07/13/19  1:17 AM  Result Value Ref Range   Prothrombin Time 17.9 (H) 11.4 - 15.2 seconds   INR 1.5 (H) 0.8 - 1.2  Cortisol     Status: None   Collection Time: 07/13/19  1:17 AM  Result Value Ref Range   Cortisol, Plasma 25.2 ug/dL  Lactic acid, plasma     Status: Abnormal   Collection Time: 07/13/19  1:19 AM  Result Value Ref Range   Lactic Acid, Venous 3.6 (HH) 0.5 - 1.9 mmol/L  SARS CORONAVIRUS 2 (TAT 6-24 HRS) Nasopharyngeal Nasopharyngeal Swab     Status: None   Collection Time: 07/13/19  2:57 AM   Specimen:  Nasopharyngeal Swab  Result Value Ref Range   SARS Coronavirus 2 NEGATIVE NEGATIVE  Lactic acid, plasma     Status: Abnormal   Collection Time: 07/13/19  3:35 AM  Result Value Ref Range   Lactic Acid, Venous 3.3 (HH) 0.5 - 1.9 mmol/L  HIV Antibody (routine testing w rflx)     Status: None   Collection Time: 07/13/19  4:31 AM  Result Value Ref Range   HIV Screen 4th Generation wRfx NON REACTIVE NON REACTIVE  Magnesium     Status: Abnormal   Collection Time: 07/13/19  4:31 AM  Result Value Ref Range   Magnesium 1.0 (L) 1.7 - 2.4 mg/dL  Phosphorus     Status: None   Collection Time: 07/13/19  4:31 AM  Result Value Ref Range   Phosphorus 3.1 2.5 - 4.6 mg/dL  TSH     Status: None   Collection Time: 07/13/19  4:31 AM  Result Value Ref Range   TSH 1.796 0.350 - 4.500 uIU/mL  CBC     Status: Abnormal   Collection Time: 07/13/19  4:31 AM  Result Value Ref Range   WBC 12.9 (H) 4.0 - 10.5 K/uL   RBC 3.95 (L) 4.22 - 5.81 MIL/uL   Hemoglobin 11.5 (L)  13.0 - 17.0 g/dL   HCT 34.8 (L) 39.0 - 52.0 %   MCV 88.1 80.0 - 100.0 fL   MCH 29.1 26.0 - 34.0 pg   MCHC 33.0 30.0 - 36.0 g/dL   RDW 14.1 11.5 - 15.5 %   Platelets 164 150 - 400 K/uL   nRBC 0.0 0.0 - 0.2 %  Comprehensive metabolic panel     Status: Abnormal   Collection Time: 07/13/19  4:31 AM  Result Value Ref Range   Sodium 130 (L) 135 - 145 mmol/L   Potassium 3.8 3.5 - 5.1 mmol/L   Chloride 95 (L) 98 - 111 mmol/L   CO2 21 (L) 22 - 32 mmol/L   Glucose, Bld 132 (H) 70 - 99 mg/dL   BUN 44 (H) 6 - 20 mg/dL   Creatinine, Ser 3.08 (H) 0.61 - 1.24 mg/dL   Calcium 8.1 (L) 8.9 - 10.3 mg/dL   Total Protein 5.3 (L) 6.5 - 8.1 g/dL   Albumin 1.9 (L) 3.5 - 5.0 g/dL   AST 24 15 - 41 U/L   ALT 14 0 - 44 U/L   Alkaline Phosphatase 68 38 - 126 U/L   Total Bilirubin 1.6 (H) 0.3 - 1.2 mg/dL   GFR calc non Af Amer 21 (L) >60 mL/min   GFR calc Af Amer 25 (L) >60 mL/min   Anion gap 14 5 - 15  Procalcitonin     Status: None   Collection Time:  07/13/19  4:31 AM  Result Value Ref Range   Procalcitonin 8.28 ng/mL  Brain natriuretic peptide     Status: Abnormal   Collection Time: 07/13/19  4:31 AM  Result Value Ref Range   B Natriuretic Peptide 299.7 (H) 0.0 - 100.0 pg/mL  Lactic acid, plasma     Status: Abnormal   Collection Time: 07/13/19  5:13 AM  Result Value Ref Range   Lactic Acid, Venous 2.4 (HH) 0.5 - 1.9 mmol/L  Respiratory Panel by RT PCR (Flu A&B, Covid) - Nasopharyngeal Swab     Status: None   Collection Time: 07/13/19  6:56 AM   Specimen: Nasopharyngeal Swab  Result Value Ref Range   SARS Coronavirus 2 by RT PCR NEGATIVE NEGATIVE   Influenza A by PCR NEGATIVE NEGATIVE   Influenza B by PCR NEGATIVE NEGATIVE  Urine rapid drug screen (hosp performed)     Status: Abnormal   Collection Time: 07/13/19  8:11 AM  Result Value Ref Range   Opiates POSITIVE (A) NONE DETECTED   Cocaine NONE DETECTED NONE DETECTED   Benzodiazepines POSITIVE (A) NONE DETECTED   Amphetamines POSITIVE (A) NONE DETECTED   Tetrahydrocannabinol NONE DETECTED NONE DETECTED   Barbiturates NONE DETECTED NONE DETECTED  Urinalysis, Routine w reflex microscopic     Status: Abnormal   Collection Time: 07/13/19  8:18 AM  Result Value Ref Range   Color, Urine YELLOW YELLOW   APPearance CLOUDY (A) CLEAR   Specific Gravity, Urine 1.020 1.005 - 1.030   pH 5.0 5.0 - 8.0   Glucose, UA NEGATIVE NEGATIVE mg/dL   Hgb urine dipstick MODERATE (A) NEGATIVE   Bilirubin Urine NEGATIVE NEGATIVE   Ketones, ur 15 (A) NEGATIVE mg/dL   Protein, ur NEGATIVE NEGATIVE mg/dL   Nitrite NEGATIVE NEGATIVE   Leukocytes,Ua NEGATIVE NEGATIVE  Urinalysis, Microscopic (reflex)     Status: Abnormal   Collection Time: 07/13/19  8:18 AM  Result Value Ref Range   RBC / HPF 6-10 0 - 5 RBC/hpf   WBC,  UA 0-5 0 - 5 WBC/hpf   Bacteria, UA MANY (A) NONE SEEN   Squamous Epithelial / LPF 0-5 0 - 5  POC SARS Coronavirus 2 Ag-ED - Nasal Swab (BD Veritor Kit)     Status: None    Collection Time: 07/13/19  8:20 AM  Result Value Ref Range   SARS Coronavirus 2 Ag NEGATIVE NEGATIVE  Glucose, capillary     Status: None   Collection Time: 07/13/19 10:58 AM  Result Value Ref Range   Glucose-Capillary 79 70 - 99 mg/dL  MRSA PCR Screening     Status: Abnormal   Collection Time: 07/13/19 10:59 AM   Specimen: Nasal Mucosa; Nasopharyngeal  Result Value Ref Range   MRSA by PCR POSITIVE (A) NEGATIVE  Heparin level (unfractionated)     Status: Abnormal   Collection Time: 07/13/19 12:45 PM  Result Value Ref Range   Heparin Unfractionated 0.81 (H) 0.30 - 0.70 IU/mL  Body fluid culture     Status: None (Preliminary result)   Collection Time: 07/13/19  1:29 PM   Specimen: Synovium; Body Fluid  Result Value Ref Range   Specimen Description SYNOVIAL KNEE LEFT    Special Requests NONE    Gram Stain      ABUNDANT WBC PRESENT, PREDOMINANTLY MONONUCLEAR ABUNDANT GRAM POSITIVE COCCI Performed at [facility], [address], [place], [place] [address]    Culture PENDING    Report Status PENDING   Synovial cell count + diff, w/ crystals     Status: Abnormal   Collection Time: 07/13/19  1:50 PM  Result Value Ref Range   Color, Synovial AMBER (A) YELLOW   Appearance-Synovial TURBID (A) CLEAR   Crystals, Fluid NO CRYSTALS SEEN    WBC, Synovial 96,500 (H) 0 - 200 /cu mm   Other Cells-SYN DEGENERATED CELLS, UNABLE TO DIFFERENTIATE.   Group A Strep by PCR     Status: None   Collection Time: 07/13/19  4:08 PM   Specimen: Throat; Sterile Swab  Result Value Ref Range   Group A Strep by PCR NOT DETECTED NOT DETECTED  Hemoglobin A1c     Status: Abnormal   Collection Time: 07/13/19  4:08 PM  Result Value Ref Range   Hgb A1c MFr Bld 6.3 (H) 4.8 - 5.6 %   Mean Plasma Glucose 134.11 mg/dL  D-dimer, quantitative (not at [facility])     Status: Abnormal   Collection Time: 07/13/19  4:08 PM  Result Value Ref Range   D-Dimer, Quant 3.78 (H) 0.00 - 0.50 ug/mL-FEU  Heparin level  (unfractionated)     Status: Abnormal   Collection Time: 07/13/19  4:35 PM  Result Value Ref Range   Heparin Unfractionated <0.10 (L) 0.30 - 0.70 IU/mL    X-ray: CLINICAL DATA:  Knee swelling and redness  EXAM: LEFT KNEE - COMPLETE 4+ VIEW  COMPARISON:  06/29/2015  FINDINGS: Prior left knee replacement, stable. Moderate joint effusion. No hardware complicating feature. No fracture, subluxation or dislocation.  IMPRESSION: Prior left knee replacement. Moderate joint effusion. No acute bony abnormality.   Electronically Signed   By: Rolm Baptise M.D.  ROS  As per admitting HPI and consult note  Blood pressure 95/64, pulse 100, temperature 97.7 F (36.5 C), temperature source Axillary, resp. rate 20, height '6\' 3"'  (1.905 m), weight 118 kg, SpO2 94 %.  Physical Exam  Awake alert but acutely ill General medical exam reviewed for findings including findings for rapid irregular heart rate: General: WDWN adult M,  supine in bed NAD  HENT: NCAT. Dry mucus membranes. Fissures on tongue, mild glossitis. Posterior pharynx erythematous with scattered tonsillar exudate. Trachea midline. Anicteric sclera, injected.  Lungs: Bibasilar crackles. Symmetrical chest expansion. No stridor, no wheeze.  Cardiovascular: IRIR s1s2 no rgm. Cap refill < 3 sec BUE. BLE non-pitting edema  Abdomen: soft, round, ndnt + bowel sounds x4 Extremities: L knee edema, erythema, scattered mild ecchymosis. L ankle edema, mild erythema. Bilat toes with fungal appearance.  Neuro: AAO x4. Following commands  Psych: Agitated GU: No abnormality  Skin: Erythematous, non-raised, blanching rash over face, ears, neck, extending over clavicles.   LEFT KNEE EXAM: Palpable effusion Tender to palpation  Pain with ROM  Assessment/Plan: 1. Septic left total knee replacement  Plan: Procedure: Left knee aspirated - 120 cc of purulent synovial fluid sent for lab analysis  NPO Stop heparin To OR tonight for  treatment of acute knee infection with I&D poly exchange in hopes of reducing infection burden to help systemic issues and hope of successfully treating his knee infection Will require 6 weeks of IV antibiotic therapy - likely Daptomycin given recent found reaction to Vancomycin   [name] 07/13/2019, 6:03 PM

## 2019-07-13 NOTE — Progress Notes (Signed)
Pharmacy Antibiotic Note  Vernon Frye is a 58 y.o. male admitted on 07/12/2019 with sepsis - L knee swollen, red, and painful.  Pharmacy has been consulted for Vancomycin dosing.  Developed Red Man's syndrome once half way through vancomycin dose. Vanc now d/c. Plan to change to zosyn. Scr 3.08 (CrCl 36 mL/min- was 1.1 08/2018).  Plan: Zosyn 3.375 g IV every 8 hours  Monitor renal fx, cx results, clinical pic, and opportunities to de-escalate   Height: 6\' 3"  (190.5 cm) Weight: 260 lb 2.3 oz (118 kg) IBW/kg (Calculated) : 84.5  Temp (24hrs), Avg:97.4 F (36.3 C), Min:97.4 F (36.3 C), Max:97.4 F (36.3 C)  Recent Labs  Lab 07/12/19 1910 07/13/19 0119 07/13/19 0335 07/13/19 0431 07/13/19 0513  WBC 16.0*  --   --  12.9*  --   CREATININE 3.36*  --   --  3.08*  --   LATICACIDVEN 4.2* 3.6* 3.3*  --  2.4*    Estimated Creatinine Clearance: 36.6 mL/min (A) (by C-G formula based on SCr of 3.08 mg/dL (H)).    No Known Allergies  Antimicrobials this admission: Vancomycin/Ceftriaxone 12/16 x1 Zosyn 12/16>>  Microbiology results: BCx 12/16: sent  L knee fluid 12/16: sent Ucx 12/16: sent  Thank you for allowing pharmacy to be a part of this patient's care.  Antonietta Jewel, PharmD, BCCCP Clinical Pharmacist  Phone: [phone]  Please check AMION for all [place] phone numbers After 10:00 PM, call [place] [phone] 07/13/2019 10:14 AM

## 2019-07-13 NOTE — Consult Note (Addendum)
The patient has been seen in conjunction with Cadence Kathlen Mody, PAC. All aspects of care have been considered and discussed. The patient has been personally interviewed, examined, and all clinical data has been reviewed.   We recommend IV amiodarone for rate control. We can also add small doses of Digoxin to slow the rate without compromising BP.  Resume low dose beta-blocker if BP allows. Some of the increased rate is related beta blocker withdrawal.  Official echo report is pending. I personally reviewed and shows Large RV with preserved LV function. May need volume to get BP up. Also need to r/o PE or other causes of RVE->COPD, etc.  May need right heart catheter to assess true LV filling pressure.     Cardiology Consultation:   Patient ID: Vernon Frye MRN: [id number]; DOB: May 01, 1961  Admit date: 07/12/2019 Date of Consult: 07/13/2019  Primary Care Provider: Nicholes Rough, PA-C Primary Cardiologist: No primary care provider on file.  Primary Electrophysiologist:  None    Patient Profile:   Vernon Frye is a 58 y.o. male with a hx of Afib not on Eliquis due to cost, anxiety, ?alcohol abuse, HTN, and L total knee replacement (2016) who is being seen today for the evaluation of Afib RVR at the request of Dr. Nelda Marseille.  History of Present Illness:   Patient is a poor historian. History obtained through chart review.   Mr. Haberer was diagnosed with afib in February 2020. The patient had been experiencing blurry vision and went to see an opthamologist who recommended an ED evaluation for a possible stroke. He then saw his PCP for palpitations and chest tightness for the last 2 weeks and he was diagnosed with Afib and told to go to the ED.  In the ED the EKG showed Afib RVR, rate 110. BP was also very elevated. MRI head did not show acute stroke. Metoprolol was increased for better rate control and he was given Eliquis 5 mg BID. He was referred to the Afib clinic but the clinic was  unable to contact the patient. He saw his PCP at [facility] who he saw in March 2020 and patient was still in afib but options appeared limited due to lack of medical insurance. He had reportedly not started Eliquis due to cost. It appears he was placed on Aspirin and metoprolol in the interim with plans to possible anticoagulate and cardiovert. Does not look like patient followed up.   The patient presented to the ED 07/13/19 for left knee pain. Pain started the day before and had been progressively increasing. In the ED B/P 93/68, 88 bpm, RR 19, afebrile, 97% O2. Labs showed Lactic acid 4.2, sodium 130, glucose 122, creatinine 3.36, BUN 43, albumin 2.5, Total bili 1.9. WBC 16.0. Blood cultures were collected. EKG showed Afib RVR, 168 bpm. X-ray left knee showed moderate joint effusion. Orthopedics was consulted and the patient was admitted for septic joint. Patient was started on cardizem bolus and infusion for rate control of afib. He was started on vancomycin but developed a severe allergic reaction and CCM was consulted for transfer.   Patient is awake and alert but is having trouble speaking clearly. He is a poor historian. He denies known history of CAD, Heart failure, HLD, vascular disease, or stroke. Positive family history of heart disease in his grandfather. He is a previous smoker (quit 12 years ago). Denied alcohol an drug use (although other notes suggest he is a daily drinker). Denies chest pain.  Heart Pathway Score:     Past Medical History:  Diagnosis Date  . Anxiety    takes Xanax daily  . Arthritis   . Chronic back pain    DDD  . Complication of anesthesia    agitated when awaking   . H/O rotator cuff tear   . History of kidney stones   . Hypertension    takes Toprol,Prinizide,and Amlodipine daily  . Joint pain   . Joint swelling   . Laceration    history of to right lower extermity   . Night muscle spasms    takes Soma daily  . Numbness and tingling    hands and  feet   . Pneumonia    history of   . Tinnitus     Past Surgical History:  Procedure Laterality Date  . HERNIA REPAIR Bilateral as a baby   inguinal-  . LITHOTRIPSY    . SHOULDER ARTHROSCOPY WITH SUBACROMIAL DECOMPRESSION AND OPEN ROTATOR C Left 01/16/2014   Procedure: LEFT SHOULDER ARTHROSCOPY WITH SUBACROMIAL DECOMPRESSION AND MINI OPEN ROTATOR CUFF REPAIR, OPEN DISTAL CLAVICLE RESECTION AND TENDODESIS;  Surgeon: Augustin Schooling, MD;  Location: [facility];  Service: Orthopedics;  Laterality: Left;  . tens unit     for severe pain   . TOTAL KNEE ARTHROPLASTY Left 06/19/2015   Procedure: LEFT TOTAL KNEE ARTHROPLASTY;  Surgeon: Paralee Cancel, MD;  Location: [facility];  Service: Orthopedics;  Laterality: Left;     Home Medications:  Prior to Admission medications   Medication Sig Start Date End Date Taking? Authorizing Provider  acetaminophen (TYLENOL) 500 MG tablet Take 2 tablets (1,000 mg total) by mouth every 8 (eight) hours. Patient not taking: Reported on 09/16/2018 06/20/15   Danae Orleans, PA-C  ALPRAZolam Duanne Moron) 1 MG tablet Take 1 tablet (1 mg total) by mouth daily as needed for anxiety or sleep. Patient taking differently: Take 0.5-1 mg by mouth 3 (three) times daily as needed for anxiety or sleep.  06/20/15   Danae Orleans, PA-C  apixaban (ELIQUIS) 5 MG TABS tablet Take 1 tablet (5 mg total) by mouth 2 (two) times daily for 30 days. 09/16/18 10/16/18  Davonna Belling, MD  diazePAM (VALIUM PO) Take 1 tablet by mouth once. Pt states he took a valium he had 'stashed' on the morning of 09/16/2018    [provider]  diclofenac sodium (VOLTAREN) 1 % GEL Apply 1 application topically 4 (four) times daily. 09/07/18   [provider]  docusate sodium (COLACE) 100 MG capsule Take 1 capsule (100 mg total) by mouth 2 (two) times daily. Patient not taking: Reported on 09/16/2018 06/20/15   Danae Orleans, PA-C  ferrous sulfate 325 (65 FE) MG tablet Take 1 tablet (325 mg total) by  mouth 3 (three) times daily after meals. Patient not taking: Reported on 06/29/2015 06/20/15   Danae Orleans, PA-C  HYDROmorphone (DILAUDID) 4 MG tablet Take 1-2 tablets (4-8 mg total) by mouth every 4 (four) hours as needed for severe pain. Patient not taking: Reported on 06/29/2015 06/20/15   Danae Orleans, PA-C  ibuprofen (ADVIL,MOTRIN) 200 MG tablet Take 400 mg by mouth every 6 (six) hours as needed for headache or mild pain.    [provider]  lisinopril-hydrochlorothiazide (PRINZIDE,ZESTORETIC) 20-12.5 MG per tablet Take 2 tablets by mouth daily.     [provider]  methocarbamol (ROBAXIN) 500 MG tablet Take 1 tablet (500 mg total) by mouth every 6 (six) hours as needed for muscle spasms. Patient not taking:  Reported on 09/16/2018 06/20/15   Danae Orleans, PA-C  metoprolol succinate (TOPROL-XL) 50 MG 24 hr tablet Take 3 tablets (150 mg total) by mouth daily. 09/16/18   Davonna Belling, MD  morphine (MS CONTIN) 30 MG 12 hr tablet Take 30 mg by mouth every 8 (eight) hours. 09/09/18   [provider]  naproxen (NAPROSYN) 500 MG tablet Take 500 mg by mouth 2 (two) times daily as needed. pain 05/22/15   [provider]  oxyCODONE-acetaminophen (PERCOCET) 10-325 MG tablet Take 1 tablet by mouth every 6 (six) hours as needed. pain 06/07/15   [provider]  polyethylene glycol (MIRALAX / GLYCOLAX) packet Take 17 g by mouth 2 (two) times daily. Patient not taking: Reported on 06/29/2015 06/20/15   Danae Orleans, PA-C  polyvinyl alcohol (LIQUIFILM TEARS) 1.4 % ophthalmic solution Place 1 drop into both eyes as needed for dry eyes.    [provider]  psyllium (HYDROCIL/METAMUCIL) 95 % PACK Take 1 packet by mouth daily as needed for mild constipation.    [provider]    Inpatient Medications: Scheduled Meds: . Chlorhexidine Gluconate Cloth  6 each Topical Daily  . folic acid  1 mg Oral Daily  . lidocaine-EPINEPHrine  20 mL  Intradermal Once  . multivitamin with minerals  1 tablet Oral Daily  . thiamine  100 mg Oral Daily   Or  . thiamine  100 mg Intravenous Daily   Continuous Infusions: . sodium chloride 75 mL/hr at 07/13/19 0819  . amiodarone 60 mg/hr (07/13/19 1400)   Followed by  . amiodarone    . DAPTOmycin (CUBICIN)  IV    . famotidine (PEPCID) IV Stopped (07/13/19 0957)  . heparin 1,500 Units/hr (07/13/19 1400)  . piperacillin-tazobactam (ZOSYN)  IV     PRN Meds: acetaminophen **OR** acetaminophen, diphenhydrAMINE, LORazepam **OR** LORazepam, ondansetron (ZOFRAN) IV, sodium chloride flush  Allergies:   No Known Allergies  Social History:   Social History   Socioeconomic History  . Marital status: Widowed    Spouse name: Not on file  . Number of children: Not on file  . Years of education: Not on file  . Highest education level: Not on file  Occupational History  . Not on file  Tobacco Use  . Smoking status: Former Smoker    Packs/day: 0.25    Years: 7.00    Pack years: 1.75    Types: Cigarettes    Quit date: 07/28/2004    Years since quitting: 14.9  . Smokeless tobacco: Never Used  . Tobacco comment: quit smoking 8 yrs ago  Substance and Sexual Activity  . Alcohol use: Yes    Comment: 3 beers daily  . Drug use: No  . Sexual activity: Yes  Other Topics Concern  . Not on file  Social History Narrative  . Not on file   Social Determinants of Health   Financial Resource Strain:   . Difficulty of Paying Living Expenses: Not on file  Food Insecurity:   . Worried About Charity fundraiser in the Last Year: Not on file  . Ran Out of Food in the Last Year: Not on file  Transportation Needs:   . Lack of Transportation (Medical): Not on file  . Lack of Transportation (Non-Medical): Not on file  Physical Activity:   . Days of Exercise per Week: Not on file  . Minutes of Exercise per Session: Not on file  Stress:   . Feeling of Stress : Not on file  Social Connections:   .  Frequency of Communication with Friends and Family: Not on file  . Frequency of Social Gatherings with Friends and Family: Not on file  . Attends Religious Services: Not on file  . Active Member of Clubs or Organizations: Not on file  . Attends Archivist Meetings: Not on file  . Marital Status: Not on file  Intimate Partner Violence:   . Fear of Current or Ex-Partner: Not on file  . Emotionally Abused: Not on file  . Physically Abused: Not on file  . Sexually Abused: Not on file    Family History:    Family History  Problem Relation Age of Onset  . Heart disease Other      ROS:  Please see the history of present illness.  All other ROS reviewed and negative.     Physical Exam/Data:   Vitals:   07/13/19 1100 07/13/19 1200 07/13/19 1300 07/13/19 1400  BP: 103/77 1_0  Pulse: (!) 116 (!) 117 (!) 112 100  Resp: 19 (!) 24 (!) 25 (!) 23  Temp:      TempSrc:      SpO2: 96% 94% 95% 95%  Weight:      Height:        Intake/Output Summary (Last 24 hours) at 07/13/2019 1454 Last data filed at 07/13/2019 1400 Gross per 24 hour  Intake 860.5 ml  Output 550 ml  Net 310.5 ml   Last 3 Weights 07/13/2019 09/16/2018 06/19/2015  Weight (lbs) 260 lb 2.3 oz 260 lb 251 lb 4 oz  Weight (kg) 118 kg 117.935 kg 113.966 kg     Body mass index is 32.52 kg/m.  General:  Well nourished, well developed obese WM in no acute distress HEENT: normal; erythematous rash on face Lymph: no adenopathy Neck: no JVD Endocrine:  No thryomegaly Vascular: No carotid bruits; FA pulses 2+ bilaterally without bruits  Cardiac:  normal S1, S2; Irreg Irreg; tachycardic; no murmur  Lungs:  Crackles bilaterally, no wheezing, rhonchi or rales  Abd: soft, nontender, no hepatomegaly  Ext: 1+ edema, L>R Musculoskeletal:  No deformities, BUE and BLE strength normal and equal Skin: warm and dry  Neuro:  CNs 2-12 intact, no focal abnormalities noted Psych:  Normal affect   EKG:  The EKG  was personally reviewed and demonstrates:  Afib RVR, 168 bpm, RAD, repol abnormalities Telemetry:  Telemetry was personally reviewed and demonstrates:  Afib, HR 100-120s, have been slowly improving  Relevant CV Studies:  Echo pending  Laboratory Data:  High Sensitivity Troponin:  No results for input(s): TROPONINIHS in the last 720 hours.   Chemistry Recent Labs  Lab 07/12/19 1910 07/13/19 0431  NA 130* 130*  K 3.6 3.8  CL 91* 95*  CO2 22 21*  GLUCOSE 122* 132*  BUN 43* 44*  CREATININE 3.36* 3.08*  CALCIUM 8.7* 8.1*  GFRNONAA 19* 21*  GFRAA 22* 25*  ANIONGAP 17* 14    Recent Labs  Lab 07/12/19 1910 07/13/19 0431  PROT 6.9 5.3*  ALBUMIN 2.5* 1.9*  AST 29 24  ALT 17 14  ALKPHOS 94 68  BILITOT 1.9* 1.6*   Hematology Recent Labs  Lab 07/12/19 1910 07/13/19 0431  WBC 16.0* 12.9*  RBC 4.55 3.95*  HGB 13.3 11.5*  HCT 40.3 34.8*  MCV 88.6 88.1  MCH 29.2 29.1  MCHC 33.0 33.0  RDW 14.0 14.1  PLT 227 164   BNP Recent Labs  Lab 07/13/19 0431  BNP 299.7*  DDimer No results for input(s): DDIMER in the last 168 hours.   Radiology/Studies:  US Renal  Result Date: 2019-07-25 CLINICAL DATA:  Acute renal injury EXAM: RENAL / URINARY TRACT ULTRASOUND COMPLETE COMPARISON:  None. FINDINGS: Right Kidney: Renal measurements: 14.7 x 6.1 x 6.6 cm. = volume: 310 mL . Echogenicity within normal limits. No mass or hydronephrosis visualized. Left Kidney: Renal measurements: 15.0 x 7.2 x 6.9 cm = volume: 385 mL. Mild hydronephrosis is noted with a prominent extrarenal pelvis. Bladder: Appears normal for degree of bladder distention. Other: None. IMPRESSION: Mild left-sided hydronephrosis is noted. Electronically Signed   By: Inez Catalina M.D.   On: 07/25/2019 10:40   DG Chest Port 1 View  Result Date: 07/25/19 CLINICAL DATA:  Atrial fibrillation, hypoxia EXAM: PORTABLE CHEST 1 VIEW COMPARISON:  09/16/2018 FINDINGS: Lungs are clear. No pleural effusion or pneumothorax.  Top-normal heart size. IMPRESSION: No acute process in the chest. Electronically Signed   By: Macy Mis M.D.   On: 2019/07/25 07:29   DG Knee Complete 4 Views Left  Result Date: 07/12/2019 CLINICAL DATA:  Knee swelling and redness EXAM: LEFT KNEE - COMPLETE 4+ VIEW COMPARISON:  06/29/2015 FINDINGS: Prior left knee replacement, stable. Moderate joint effusion. No hardware complicating feature. No fracture, subluxation or dislocation. IMPRESSION: Prior left knee replacement. Moderate joint effusion. No acute bony abnormality. Electronically Signed   By: Rolm Baptise M.D.   On: 07/12/2019 19:30   ECHOCARDIOGRAM COMPLETE  Result Date: 07/25/2019   ECHOCARDIOGRAM REPORT   Patient Name:   COLLEEN DONAHOE Date of Exam: 07/25/2019 Medical Rec #:  [id number]       Height:       75.0 in Accession #:    [id number]      Weight:       260.0 lb Date of Birth:  11-Dec-1960      BSA:          2.45 m Patient Age:    89 years        BP:           101/68 mmHg Patient Gender: M               HR:           124 bpm. Exam Location:  Inpatient Procedure: 2D Echo STAT ECHO Indications:    Atrial Fibrillation 427.31/ I48.91  History:        Patient has no prior history of Echocardiogram examinations.                 Risk Factors:Hypertension. Left knee sepitc joint, acute kidney                 injury, allergic reaction to antibiotic, COVID-19 test pending,.  Sonographer:    Darlina Sicilian RDCS Referring Phys: [id number] ANAND D HONGALGI  Sonographer Comments: Patient condition.  LEFT VENTRICLE PLAX 2D LVIDd:         4.20 cm LVIDs:         2.90 cm LV PW:         1.50 cm LV IVS:        1.40 cm LVOT diam:     1.90 cm LV SV:         46 ml LV SV Index:   18.34 LVOT Area:     2.84 cm  LEFT ATRIUM            Index LA diam:      4.00 cm  1.63 cm/m LA Vol (A4C): 104.0 ml 42.39 ml/m   AORTA Ao Root diam: 2.70 cm MITRAL VALVE MV Area (PHT): 5.34 cm            SHUNTS MV PHT:        41.18 msec          Systemic Diam: 1.90 cm MV Decel Time:  142 msec MV E velocity: 88.83 cm/s 103 cm/s  Electronically signed by Signature Date/Time: /    Preliminary    VAS [place] LOWER EXTREMITY VENOUS (DVT)  Result Date: 07/13/2019  Lower Venous Study Indications: Edema.  Comparison Study: no prior Performing Technologist: Abram Sander RVS  Examination Guidelines: A complete evaluation includes B-mode imaging, spectral Doppler, color Doppler, and power Doppler as needed of all accessible portions of each vessel. Bilateral testing is considered an integral part of a complete examination. Limited examinations for reoccurring indications may be performed as noted.  +---------+---------------+---------+-----------+----------+--------------+ RIGHT    CompressibilityPhasicitySpontaneityPropertiesThrombus Aging +---------+---------------+---------+-----------+----------+--------------+ CFV      Full           Yes      Yes                                 +---------+---------------+---------+-----------+----------+--------------+ SFJ      Full                                                        +---------+---------------+---------+-----------+----------+--------------+ FV Prox  Full                                                        +---------+---------------+---------+-----------+----------+--------------+ FV Mid   Full                                                        +---------+---------------+---------+-----------+----------+--------------+ FV DistalFull                                                        +---------+---------------+---------+-----------+----------+--------------+ PFV      Full                                                        +---------+---------------+---------+-----------+----------+--------------+ POP      Full           Yes      Yes                                 +---------+---------------+---------+-----------+----------+--------------+ PTV      Full                                                         +---------+---------------+---------+-----------+----------+--------------+  PERO     Full                                                        +---------+---------------+---------+-----------+----------+--------------+   +---------+---------------+---------+-----------+----------+--------------+ LEFT     CompressibilityPhasicitySpontaneityPropertiesThrombus Aging +---------+---------------+---------+-----------+----------+--------------+ CFV      Full           Yes      Yes                                 +---------+---------------+---------+-----------+----------+--------------+ SFJ      Full                                                        +---------+---------------+---------+-----------+----------+--------------+ FV Prox  Full                                                        +---------+---------------+---------+-----------+----------+--------------+ FV Mid   Full                                                        +---------+---------------+---------+-----------+----------+--------------+ FV DistalFull                                                        +---------+---------------+---------+-----------+----------+--------------+ PFV      Full                                                        +---------+---------------+---------+-----------+----------+--------------+ POP      Full           Yes      Yes                                 +---------+---------------+---------+-----------+----------+--------------+ PTV      Full                                                        +---------+---------------+---------+-----------+----------+--------------+ PERO  Not visualized +---------+---------------+---------+-----------+----------+--------------+     Summary: Right: There is no evidence of deep vein thrombosis in the lower extremity.  No cystic structure found in the popliteal fossa. Left: There is no evidence of deep vein thrombosis in the lower extremity. No cystic structure found in the popliteal fossa.  *See table(s) above for measurements and observations. Electronically signed by Monica Martinez MD on 07/13/2019 at 12:52:52 PM.    Final     Assessment and Plan:   Sepsis/Septic joint Patient presented with left joint pain. Patient was tachycardic, tachypeic with leukocytosis and lactic acidosis. X-ray left knee showed moderate effusion. ESR and CRP elevated. Started on IV fluids and abx. He was started on Vanc but developed severe allergic reaction and CCM was consulted for transfer - IV abx per CCM - Blood cultures an urine cultures pending.  - Lactic acid improved to 2.4 - Orthopedics following>> may require diagnostic arthrocentesis and/or left knee washout  Afib RVR In the setting of sepsis. Patient was diagnosed at the beginning of this year and initially placed on Toprol and Eliquis, but was unable to afford Eliquis. He had been taking Aspirin. In the ED rates were up to 150s and he was started on IV cardizem and IV heparin. Blood pressures were soft and he was switched to IV amiodarone.  - Echo results pending, unofficial read by Dr. Tamala Julian normal EF with a dilated - TSH 1.7 - Mag 1.0>> supplement - K+ 3.8 - Can with IV heparin for possible procedure. CHADSVASC = 1 (HTN). Will discuss further anticoagulation in the future. Might be a candidate due to compliance issues. Might have to settle with rate control.  - With hypotension continue IV amiodarone. Once pressures improve would add back BB  Right heart Failure - Unofficial echo read shows dilation of the RV  - Unsure if there is a pulmonary aspect to this. Patient was a previous smoker.  - This could explain his hypotension - Will check a D-dimer  AKI Admitted with creatinine 3.3, BUN 43. Was 1.1 earlier this year. - In the setting of severe sepsis -  Patient with some urinary retention possible requiring Foley cath.  - creatinine 3.36 > 3.08  Allergic drug reaction - possible Red man syndrome in reaction to Vanc>> stopped - treatment per CCM  Alcohol use disorder - CIWA protocol  Encephalopathy - Multifactorial due to sepsis, alcohol withdrawal, etc.  - Unsure of baseline   For questions or updates, please contact [facility] Please consult [email or web address] for contact info under     Signed, Cadence Ninfa Meeker, PA-C  07/13/2019 2:54 PM

## 2019-07-13 NOTE — H&P (Addendum)
NAME:  BELINDA BRINGHURST, MRN:  [id number], DOB:  May 09, 1961, LOS: 0 ADMISSION DATE:  07/12/2019, CONSULTATION DATE:  07/13/19 REFERRING MD:  Algis Liming - [facility], CHIEF COMPLAINT:  L knee pain  Brief History   58 yo M presents with L knee pair, prior L TKR   History of present illness   History obtained from chart as patient states "I will not repeat myself again, not after that guy."   58 yo M PMH Afib, HTN, L TKR (2016) who presented to ED with CC L knee pain. Pain began 12/14 and has progressed prompting presentation. Patient endorses L knee edema, L knee erythema onset in this time period and also progressive in nature.  Denied SOB, chest pain. Denies recreational drug use. Denies sick contacts.   In ED, A fib RVR, started empirically on abx for suspected septic joint. During volume resuscitation and vanc administration, patient developed erythematous, blanching facial rash extending down neck to clavicle. Vanc was stopped and orders for benadryl, Pepcid placed. Became tachypnic and desaturated to 88%, started on 3LNC with SpO2 now 100%. PCCM consulted for admission.  No angioedema on my exam and patient denies SOB or difficulty breathing, he does state that his mouth is dry.   Relevant ED labs:  Na 130, K 3.8, Cl 95, Co2 21 BUN 44 Cr 3.08 Lactic Acid 3.3, AG 14 Phos 3.1, Mag 1.0, Alb 1.0  Alk Phos 68, AST 24, ALT 14, Tbili 1.6  Past Medical History  A fib HTN Anxiety L TKR  L shoulder arthroscopy  DDD  Significant Hospital Events   12/16 presents to ED for L knee pain. Concern for septic joint. A fiv RVR. Developed SOB after vanc administration. PCCM consulted for admission   Consults:  Ortho   Procedures:    Significant Diagnostic Tests:  12/16 ECHO >>>  12/16 CXR > no effusion, no pneumothorax, possible mild edema  12/15 L knee XR> prior L knee replacement. Moderate joint effusion, no body abnormality  Micro Data:  12/16 SARS Cov2>>> 12/16 BCx>>> 12/16 UCx>>> 12/16  Strep A>>> 12/16 Flu A/B> neg  Antimicrobials:  12/16 vanc 12/16 ceftriaxone  12/16 Zosyn >>>   Interim history/subjective:  Vanc discontinued in ED due to development of erythematous facial rash.  Remains in Afib 100% on [facility]   Patient is clearly agitated, stating, "I will not repeat myself again.." "I don't know what that guy is but I will not allow one of those to take care of me..." "one of those is not allowed to cut me open..." "he is not allowed to do anything to me ever"   We discussed the patient's transfer of care from the hospitalist team to PCCM, and discussed that neither hospitalist medicine nor critical care medicine are the orthopaedic surgery teams; the ortho team has been consulted and will evaluate for surgical interventions.   Objective   Blood pressure 101/68, pulse (!) 124, temperature (!) 97.4 F (36.3 C), temperature source Oral, resp. rate (!) 29, SpO2 98 %.        Intake/Output Summary (Last 24 hours) at 07/13/2019 0263 Last data filed at 07/13/2019 0256 Gross per 24 hour  Intake 22.06 ml  Output --  Net 22.06 ml   There were no vitals filed for this visit.  Examination: General: WDWN adult M, supine in bed NAD  HENT: NCAT. Dry mucus membranes. Fissures on tongue, mild glossitis. Posterior pharynx erythematous with scattered tonsillar exudate. Trachea midline. Anicteric sclera, injected.  Lungs: Bibasilar crackles.  Symmetrical chest expansion. No stridor, no wheeze.  Cardiovascular: IRIR s1s2 no rgm. Cap refill < 3 sec BUE. BLE non-pitting edema  Abdomen: soft, round, ndnt + bowel sounds x4 Extremities: L knee edema, erythema, scattered mild ecchymosis. L ankle edema, mild erythema. Bilat toes with fungal appearance.  Neuro: AAO x4. Following commands  Psych: Agitated GU: No abnormality  Skin: Erythematous, non-raised, blanching rash over face, ears, neck, extending over clavicles.   Resolved Hospital Problem list     Assessment & Plan:   L  Knee pain -suspect septic joint in setting of L TKR in 2016 P -awaiting ortho consult  -will change abx to zosyn    AKI -Hx kidney stones  -Hx urinary retention  P -trend renal indices -Strict I/O -Renal US   Afib RVR -non-compliant w eliquis outpatient P -dc dilt gtt -amio bolus and subsequent gtt -ICU monitoring -Heparin per pharm   Acute Respiratory Insufficiency with hypoxia -following fluid bolus in ED -crackles on exam, most suspicious for pulm edema -Covid neg  -Flu A/B neg  -low suspicion for PE Glossitis, mild  -Hx etoh -- wonder if there is degree of B vit deficiency here  Sore throat, tonsillar exudate  P -NPO -Reduce IVF -Supplemental O2 gor > 92% -Monitor for airway compromise, at risk for intubation  -Strep A sent -ECHO pending   Concern for allergic drug reaction  -erythematous rash, tachypnea after IVF and vanc initiation. Most c/w Red Man + pulm edema. 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 No aggressive replacement     Best practice:  Diet: NPO Pain/Anxiety/Delirium protocol (if indicated): na VAP protocol (if indicated): na DVT prophylaxis: heparin  GI prophylaxis: pepcid  Glucose control: monitor Mobility: BR Code Status: Full  Family Communication: Patient updated  Disposition: ICU  Labs   CBC: Recent Labs  Lab 07/12/19 1910 07/13/19 0431  WBC 16.0* 12.9*  NEUTROABS 13.5*  --   HGB 13.3 11.5*  HCT 40.3 34.8*  MCV 88.6 88.1  PLT 227 164    Basic Metabolic  Panel: Recent Labs  Lab 07/12/19 1910 07/13/19 0431  NA 130* 130*  K 3.6 3.8  CL 91* 95*  CO2 22 21*  GLUCOSE 122* 132*  BUN 43* 44*  CREATININE 3.36* 3.08*  CALCIUM 8.7* 8.1*  MG  --  1.0*  PHOS  --  3.1   GFR: CrCl cannot be calculated (Unknown ideal weight.). Recent Labs  Lab 07/12/19 1910 07/13/19 0119 07/13/19 0335 07/13/19 0431 07/13/19 0513  WBC 16.0*  --   --  12.9*  --   LATICACIDVEN 4.2* 3.6* 3.3*  --  2.4*    Liver Function Tests: Recent Labs  Lab 07/12/19 1910 07/13/19 0431  AST 29 24  ALT 17 14  ALKPHOS 94 68  BILITOT 1.9* 1.6*  PROT 6.9 5.3*  ALBUMIN 2.5* 1.9*   No results for input(s): LIPASE, AMYLASE in the last 168 hours. No results for input(s): AMMONIA in the last 168 hours.  ABG No results found for: PHART,   PCO2ART, PO2ART, HCO3, TCO2, ACIDBASEDEF, O2SAT   Coagulation Profile: Recent Labs  Lab 07/13/19 0117  INR 1.5*    Cardiac Enzymes: No results for input(s): CKTOTAL, CKMB, CKMBINDEX, TROPONINI in the last 168 hours.  HbA1C: No results found for: HGBA1C  CBG: No results for input(s): GLUCAP in the last 168 hours.  Review of Systems:   As per HPI. Unfortunately limited due to patient non-compliance with answering questions, chart review and discussion with other prioviders at time of patient exam.   Past Medical History  He,  has a past medical history of Anxiety, Arthritis, Chronic back pain, Complication of anesthesia, H/O rotator cuff tear, History of kidney stones, Hypertension, Joint pain, Joint swelling, Laceration, Night muscle spasms, Numbness and tingling, Pneumonia, and Tinnitus.   Surgical History    Past Surgical History:  Procedure Laterality Date  . HERNIA REPAIR Bilateral as a baby   inguinal-  . LITHOTRIPSY    . SHOULDER ARTHROSCOPY WITH SUBACROMIAL DECOMPRESSION AND OPEN ROTATOR C Left 01/16/2014   Procedure: LEFT SHOULDER ARTHROSCOPY WITH SUBACROMIAL DECOMPRESSION AND MINI OPEN ROTATOR CUFF REPAIR, OPEN  DISTAL CLAVICLE RESECTION AND TENDODESIS;  Surgeon: Steven R Norris, MD;  Location: [facility];  Service: Orthopedics;  Laterality: Left;  . tens unit     for severe pain   . TOTAL KNEE ARTHROPLASTY Left 06/19/2015   Procedure: LEFT TOTAL KNEE ARTHROPLASTY;  Surgeon: Matthew Olin, MD;  Location: [facility];  Service: Orthopedics;  Laterality: Left;     Social History   reports that he quit smoking about 14 years ago. His smoking use included cigarettes. He has a 1.75 pack-year smoking history. He has never used smokeless tobacco. He reports current alcohol use. He reports that he does not use drugs.   Family History   His family history is not on file.   Allergies No Known Allergies   Home Medications  Prior to Admission medications   Medication Sig Start Date End Date Taking? Authorizing Provider  acetaminophen (TYLENOL) 500 MG tablet Take 2 tablets (1,000 mg total) by mouth every 8 (eight) hours. Patient not taking: Reported on 09/16/2018 06/20/15   Babish, Matthew, PA-C  ALPRAZolam (XANAX) 1 MG tablet Take 1 tablet (1 mg total) by mouth daily as needed for anxiety or sleep. Patient taking differently: Take 0.5-1 mg by mouth 3 (three) times daily as needed for anxiety or sleep.  06/20/15   Babish, Matthew, PA-C  apixaban (ELIQUIS) 5 MG TABS tablet Take 1 tablet (5 mg total) by mouth 2 (two) times daily for 30 days. 09/16/18 10/16/18  Pickering, Nathan, MD  diazePAM (VALIUM PO) Take 1 tablet by mouth once. Pt states he took a valium he had 'stashed' on the morning of 09/16/2018    [provider]  diclofenac sodium (VOLTAREN) 1 % GEL Apply 1 application topically 4 (four) times daily. 09/07/18   [provider]  docusate sodium (COLACE) 100 MG capsule Take 1 capsule (100 mg total) by mouth 2 (two) times daily. Patient not taking: Reported on 09/16/2018 06/20/15   Babish, Matthew, PA-C  ferrous sulfate 325 (65 FE) MG tablet Take 1 tablet (325 mg total) by mouth 3 (three) times daily  after meals. Patient not taking: Reported on 06/29/2015 06/20/15   Babish, Matthew, PA-C  HYDROmorphone (DILAUDID) 4 MG tablet Take 1-2 tablets (4-8 mg total) by mouth every 4 (four) hours as needed for severe pain. Patient not taking: Reported on 06/29/2015 06/20/15   Babish, Matthew, PA-C  ibuprofen (ADVIL,MOTRIN) 200   MG tablet Take 400 mg by mouth every 6 (six) hours as needed for headache or mild pain.    [provider]  lisinopril-hydrochlorothiazide (PRINZIDE,ZESTORETIC) 20-12.5 MG per tablet Take 2 tablets by mouth daily.     [provider]  methocarbamol (ROBAXIN) 500 MG tablet Take 1 tablet (500 mg total) by mouth every 6 (six) hours as needed for muscle spasms. Patient not taking: Reported on 09/16/2018 06/20/15   Babish, Matthew, PA-C  metoprolol succinate (TOPROL-XL) 50 MG 24 hr tablet Take 3 tablets (150 mg total) by mouth daily. 09/16/18   Pickering, Nathan, MD  morphine (MS CONTIN) 30 MG 12 hr tablet Take 30 mg by mouth every 8 (eight) hours. 09/09/18   [provider]  naproxen (NAPROSYN) 500 MG tablet Take 500 mg by mouth 2 (two) times daily as needed. pain 05/22/15   [provider]  oxyCODONE-acetaminophen (PERCOCET) 10-325 MG tablet Take 1 tablet by mouth every 6 (six) hours as needed. pain 06/07/15   [provider]  polyethylene glycol (MIRALAX / GLYCOLAX) packet Take 17 g by mouth 2 (two) times daily. Patient not taking: Reported on 06/29/2015 06/20/15   Babish, Matthew, PA-C  polyvinyl alcohol (LIQUIFILM TEARS) 1.4 % ophthalmic solution Place 1 drop into both eyes as needed for dry eyes.    [provider]  psyllium (HYDROCIL/METAMUCIL) 95 % PACK Take 1 packet by mouth daily as needed for mild constipation.    [provider]     Critical care time: 45 min      Grace Bowser MSN, AGACNP-BC [facility] [phone] If no answer, [phone] 07/13/2019, 10:05 AM  Attending  Note:  57 year old male with PMH of a-fib on eliquis and knee replacement history who presents with knee swelling and concern for infection.  The patient was admitted by [facility] and was given rocephin and vanc.  After vanc infusion developed diffuse red skin and concern for red man syndrome arose as patient became tachycardic, mildly hypotensive and c/o SOB.  O2 demand increased and PCCM was consulted.  Upon evaluation, patient is in a-fib with RVR with rate in the 120s but O2 sat is improved and BP has improved with bibasilar crackles on exam and significant increase in WOB on exam, patient appears miserable.  I reviewed CXR myself, no acute disease noted.  Discussed with PCCM-NP.  D/C vanc.  Single dose of steroids 125 of solumedrol x1 then to stress dose steroid until cortisol level returns.  With renal function, gentle hydration and renal U/S.  IVF resuscitation.  No need for pressors at this time.  H2 blocker to be ordered.  Change from rocephin to zosyn.  Amiodarone drip with bolus and attempt to d/c dilt given BP.  Ortho to perform a tap of the knee when comfortable with anti-coagulation worsens.  Admit to the ICU for closer observation.  May consider a cardiology consult for a-fib and will defer use of amiodarone to primary at this time.  The patient is critically ill with multiple organ systems failure and requires high complexity decision making for assessment and support, frequent evaluation and titration of therapies, application of advanced monitoring technologies and extensive interpretation of multiple databases.   Critical Care Time devoted to patient care services described in this note is  40  Minutes. This time reflects time of care of this signee Dr  . This critical care time does not reflect procedure time, or teaching time or supervisory time of PA/NP/Med student/Med Resident   etc but could involve care discussion time.  Rush Farmer, M.D. [facility].

## 2019-07-13 NOTE — ED Provider Notes (Signed)
Emergency Department Provider Note   I have reviewed the triage vital signs and the nursing notes.   HISTORY  Chief Complaint Knee Pain   HPI LAVALE SENDER is a 58 y.o. male with medical problems documented below who presents the emergency department today secondary to left knee pain.  Patient states that he had a total joint back in 2016 without any difficulty but yesterday started a mild amount of pain.  Throughout the ensuing 24 to 30 hours the pain is progressively worsened with increasing swelling and redness and warmth to the same knee.  No falls.  No other associated issues besides overall feeling unwell.   No other associated or modifying symptoms.    Past Medical History:  Diagnosis Date  . Anxiety    takes Xanax daily  . Arthritis   . Chronic back pain    DDD  . Complication of anesthesia    agitated when awaking   . H/O rotator cuff tear   . History of kidney stones   . Hypertension    takes Toprol,Prinizide,and Amlodipine daily  . Joint pain   . Joint swelling   . Laceration    history of to right lower extermity   . Night muscle spasms    takes Soma daily  . Numbness and tingling    hands and feet   . Pneumonia    history of   . Tinnitus     Patient Active Problem List   Diagnosis Date Noted  . 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 S/P knee replacement 06/19/2015  . Rotator cuff tear 01/16/2014  . HYPERTENSION 01/31/2010  . PNEUMONIA 01/31/2010  . SWELLING, MASS, OR LUMP IN CHEST 01/31/2010    Past Surgical History:  Procedure Laterality Date  . HERNIA REPAIR Bilateral as a baby   inguinal-  . LITHOTRIPSY    . SHOULDER ARTHROSCOPY WITH SUBACROMIAL DECOMPRESSION AND OPEN ROTATOR C Left 01/16/2014   Procedure: LEFT SHOULDER ARTHROSCOPY WITH SUBACROMIAL DECOMPRESSION AND MINI OPEN ROTATOR CUFF REPAIR, OPEN DISTAL CLAVICLE RESECTION AND TENDODESIS;  Surgeon: Augustin Schooling, MD;  Location: [facility];  Service: Orthopedics;  Laterality: Left;  . tens unit       for severe pain   . TOTAL KNEE ARTHROPLASTY Left 06/19/2015   Procedure: LEFT TOTAL KNEE ARTHROPLASTY;  Surgeon: Paralee Cancel, MD;  Location: [facility];  Service: Orthopedics;  Laterality: Left;    Current Outpatient Rx  . Order #: [id number] Class: No Print  . Order #: [id number] Class: Print  . Order #: [id number] Class: Normal  . Order #: [id number] Class: Historical Med  . Order #: [id number] Class: Historical Med  . Order #: [id number] Class: No Print  . Order #: [id number] Class: No Print  . Order #: [id number] Class: Print  . Order #: [id number] Class: Historical Med  . Order #: [id number] Class: Historical Med  . Order #: [id number] Class: Print  . Order #: [id number] Class: Normal  . Order #: [id number] Class: Historical Med  . Order #: [id number] Class: Historical Med  . Order #: [id number] Class: Historical Med  . Order #: [id number] Class: No Print  . Order #: [id number] Class: Historical Med  . Order #: [id number] Class: Historical Med    Allergies Patient has no known allergies.  No family history on file.  Social History Social History   Tobacco Use  . Smoking status: Former Smoker    Packs/day: 0.25    Years: 7.00    Pack years: 1.75    Types: Cigarettes    Quit date: 07/28/2004    Years  since quitting: 14.9  . Smokeless tobacco: Never Used  . Tobacco comment: quit smoking 8 yrs ago  Substance Use Topics  . Alcohol use: Yes    Comment: 3 beers daily  . Drug use: No    Review of Systems  All other systems negative except as documented in the HPI. All pertinent positives and negatives as reviewed in the HPI. ____________________________________________   PHYSICAL EXAM:  VITAL SIGNS: ED Triage Vitals  Enc Vitals Group     BP 07/12/19 1855 93/68     Pulse Rate 07/12/19 1855 88     Resp 07/12/19 1855 19     Temp 07/13/19 0037 (!) 97.4 F (36.3 C)     Temp Source 07/13/19 0037 Oral     SpO2 07/12/19 2132 97 %     Weight --      Height --      Head Circumference --      Peak  Flow --      Pain Score 07/12/19 1851 10     Pain Loc --      Pain Edu? --      Excl. in [place]? --     Constitutional: Alert and oriented. Well appearing and in no acute distress. Eyes: Conjunctivae are normal. PERRL. EOMI. Head: Atraumatic. Nose: No congestion/rhinnorhea. Mouth/Throat: Mucous membranes are moist.  Oropharynx non-erythematous. Neck: No stridor.  No meningeal signs.   Cardiovascular: Normal rate, regular rhythm. Good peripheral circulation. Grossly normal heart sounds.   Respiratory: Normal respiratory effort.  No retractions. Lungs CTAB. Gastrointestinal: Soft and nontender. No distention.  Musculoskeletal: left knee effusion, erythema, mild warmth, significant pain with any passive or active ROM, pain with weight bearing as well. Neurologic:  Normal speech and language. No gross focal neurologic deficits are appreciated.  Skin:  Skin is warm, dry and intact. No rash noted.   ____________________________________________   LABS (all labs ordered are listed, but only abnormal results are displayed)  Labs Reviewed  LACTIC ACID, PLASMA - Abnormal; Notable for the following components:      Result Value   Lactic Acid, Venous 4.2 (*)    All other components within normal limits  COMPREHENSIVE METABOLIC PANEL - Abnormal; Notable for the following components:   Sodium 130 (*)    Chloride 91 (*)    Glucose, Bld 122 (*)    BUN 43 (*)    Creatinine, Ser 3.36 (*)    Calcium 8.7 (*)    Albumin 2.5 (*)    Total Bilirubin 1.9 (*)    GFR calc non Af Amer 19 (*)    GFR calc Af Amer 22 (*)    Anion gap 17 (*)    All other components within normal limits  CBC WITH DIFFERENTIAL/PLATELET - Abnormal; Notable for the following components:   WBC 16.0 (*)    Neutro Abs 13.5 (*)    Lymphs Abs 0.6 (*)    Abs Immature Granulocytes 0.91 (*)    All other components within normal limits  CULTURE, BLOOD (ROUTINE X 2)  CULTURE, BLOOD (ROUTINE X 2)  URINE CULTURE  BODY FLUID CULTURE    GRAM STAIN  LACTIC ACID, PLASMA  URINALYSIS, ROUTINE W REFLEX MICROSCOPIC  SEDIMENTATION RATE  C-REACTIVE PROTEIN  APTT  PROTIME-INR  URINALYSIS, ROUTINE W REFLEX MICROSCOPIC  GLUCOSE, BODY FLUID OTHER  PROTEIN, BODY FLUID (OTHER)  SYNOVIAL CELL COUNT + DIFF, W/ CRYSTALS  URIC ACID, BODY FLUID   ____________________________________________  EKG   EKG Interpretation  Date/Time:  Wednesday July 13 2019 01:47:01 EST Ventricular Rate:  168 PR Interval:    QRS Duration: 88 QT Interval:  270 QTC Calculation: 452 R Axis:   111 Text Interpretation: atrial fibrillation Right axis deviation Nonspecific T abnormalities, diffuse leads faster rate than februrary. Confirmed by Merrily Pew [phone]) on 07/14/2019 12:51:20 AM       ____________________________________________  RADIOLOGY  DG Knee Complete 4 Views Left  Result Date: 07/12/2019 CLINICAL DATA:  Knee swelling and redness EXAM: LEFT KNEE - COMPLETE 4+ VIEW COMPARISON:  06/29/2015 FINDINGS: Prior left knee replacement, stable. Moderate joint effusion. No hardware complicating feature. No fracture, subluxation or dislocation. IMPRESSION: Prior left knee replacement. Moderate joint effusion. No acute bony abnormality. Electronically Signed   By: Rolm Baptise M.D.   On: 07/12/2019 19:30    ____________________________________________   PROCEDURES  Procedure(s) performed:   .Critical Care Performed by: Merrily Pew, MD Authorized by: Merrily Pew, MD   Critical care provider statement:    Critical care time (minutes):  45   Critical care was necessary to treat or prevent imminent or life-threatening deterioration of the following conditions:  Sepsis, shock, dehydration and renal failure   Critical care was time spent personally by me on the following activities:  Discussions with consultants, evaluation of patient's response to treatment, examination of patient, ordering and performing treatments and  interventions, ordering and review of laboratory studies, ordering and review of radiographic studies, pulse oximetry, re-evaluation of patient's condition, obtaining history from patient or surrogate and review of old charts    ____________________________________________   INITIAL IMPRESSION / ASSESSMENT AND PLAN / ED COURSE  Joint effusion, leukocytosis, AKI, elevated lactic acid.  Consistent with likely septic arthritis less likely to be gouty arthritis.  Will discuss with orthopedics to ensure that they are okay with me doing an arthrocentesis.  Also discussed antibiotic choices or would they rather me wait until cultures result.  D/w Dr. Stann Mainland regarding abx/arthrocentesis. Stated it could be an outpatient management, however since patient has elevated lactic, leukocytosis and AKI, will need at least observation in hospital. Hold on antibiotics from their standpoint. Will d/w [name] regarding preferred management.   [name] will see in AM and do arthrocentesis themselves.   Just prior to admission, Patient went into Afib RVR. H/o afib. Already anticoagulated with Eliquis. Started on diltiazem, hospitalist to follow up for same.    Pertinent labs & imaging results that were available during my care of the patient were reviewed by me and considered in my medical decision making (see chart for details).  ____________________________________________  FINAL CLINICAL IMPRESSION(S) / ED DIAGNOSES  Final diagnoses:  Acute pain of left knee  Sepsis, due to unspecified organism, unspecified whether acute organ dysfunction present [facility])  Atrial fibrillation with rapid ventricular response (HCC)    MEDICATIONS GIVEN DURING THIS VISIT:  Medications  sodium chloride flush (NS) 0.9 % injection 3 mL (has no administration in time range)  lactated ringers bolus 1,000 mL (has no administration in time range)  fentaNYL (SUBLIMAZE) injection 50 mcg (has no administration in time range)    ondansetron (ZOFRAN) injection 4 mg (has no administration in time range)  lidocaine-EPINEPHrine (XYLOCAINE W/EPI) 2 %-1:100000 (with pres) injection 20 mL (has no administration in time range)     NEW OUTPATIENT MEDICATIONS STARTED DURING THIS VISIT:  New Prescriptions   No medications on file    Note:  This note was prepared with assistance of Dragon voice recognition software. Occasional wrong-word or sound-a-like  substitutions may have occurred due to the inherent limitations of voice recognition software.   Kanetra Ho, Corene Cornea, MD 07/14/19 [phone]

## 2019-07-13 NOTE — Progress Notes (Signed)
ANTICOAGULATION CONSULT NOTE - Initial Consult  Pharmacy Consult for heparin Indication: atrial fibrillation  No Known Allergies  Patient Measurements: Height: 6\' 3"  (190.5 cm) Weight: 260 lb 2.3 oz (118 kg) IBW/kg (Calculated) : 84.5 Heparin Dosing Weight: 109.3 kg  Vital Signs: Temp: 97.4 F (36.3 C) (12/16 0037) Temp Source: Oral (12/16 0037) BP: 105/69 (12/16 0830) Pulse Rate: 120 (12/16 0830)  Labs: Recent Labs    07/12/19 1910 07/13/19 0117 07/13/19 0431  HGB 13.3  --  11.5*  HCT 40.3  --  34.8*  PLT 227  --  164  APTT  --  30  --   LABPROT  --  17.9*  --   INR  --  1.5*  --   CREATININE 3.36*  --  3.08*    Estimated Creatinine Clearance: 36.6 mL/min (A) (by C-G formula based on SCr of 3.08 mg/dL (H)).   Medical History: Past Medical History:  Diagnosis Date  . Anxiety    takes Xanax daily  . Arthritis   . Chronic back pain    DDD  . Complication of anesthesia    agitated when awaking   . H/O rotator cuff tear   . History of kidney stones   . Hypertension    takes Toprol,Prinizide,and Amlodipine daily  . Joint pain   . Joint swelling   . Laceration    history of to right lower extermity   . Night muscle spasms    takes Soma daily  . Numbness and tingling    hands and feet   . Pneumonia    history of   . Tinnitus     Medications:  Scheduled:  . amiodarone  150 mg Intravenous Once  . folic acid  1 mg Oral Daily  . heparin  5,500 Units Intravenous Once  . lidocaine-EPINEPHrine  20 mL Intradermal Once  . methylPREDNISolone (SOLU-MEDROL) injection  125 mg Intravenous Once  . multivitamin with minerals  1 tablet Oral Daily  . thiamine  100 mg Oral Daily   Or  . thiamine  100 mg Intravenous Daily    Assessment: 75 yom presenting with CC L knee pain and in Afib RVR. Has known hx of Afib - pt was on apixaban in the past but unable to afford.   Hgb 11.5, plt 164. In AKI with Scr elevated at 3.08 (CrCl 36 mL/min). No s/sx of bleeding.    Goal of Therapy:  Heparin level 0.3-0.7 units/ml Monitor platelets by anticoagulation protocol: Yes   Plan:  Give 5500 units bolus x 1 Start heparin infusion at 1500 units/hr Check anti-Xa level in 6 hours and daily while on heparin Continue to monitor H&H and platelets  Antonietta Jewel, PharmD, BCCCP Clinical Pharmacist  Phone: [phone]  Please check AMION for all [place] phone numbers After 10:00 PM, call [place] [phone] 07/13/2019,10:06 AM

## 2019-07-13 NOTE — Progress Notes (Signed)
Needs iv site for Iv heparin infusion. PIV attempted x2 to left ac , unsuccessful. Pressure dressing applied. Site unremarkable.Iv team consult ordered.

## 2019-07-13 NOTE — Progress Notes (Signed)
ANTICOAGULATION CONSULT NOTE - Initial Consult  Pharmacy Consult for heparin Indication: atrial fibrillation  No Known Allergies  Patient Measurements: Height: 6\' 3"  (190.5 cm) Weight: 260 lb 2.3 oz (118 kg) IBW/kg (Calculated) : 84.5 Heparin Dosing Weight: 109.3 kg  Vital Signs: Temp: 97.7 F (36.5 C) (12/16 1500) Temp Source: Axillary (12/16 1500) BP: 95/64 (12/16 1700) Pulse Rate: 100 (12/16 1700)  Labs: Recent Labs    07/12/19 1910 07/13/19 0117 07/13/19 0431 07/13/19 1245 07/13/19 1635  HGB 13.3  --  11.5*  --   --   HCT 40.3  --  34.8*  --   --   PLT 227  --  164  --   --   APTT  --  30  --   --   --   LABPROT  --  17.9*  --   --   --   INR  --  1.5*  --   --   --   HEPARINUNFRC  --   --   --  0.81* <0.10*  CREATININE 3.36*  --  3.08*  --   --     Estimated Creatinine Clearance: 36.6 mL/min (A) (by C-G formula based on SCr of 3.08 mg/dL (H)).   Medical History: Past Medical History:  Diagnosis Date  . Anxiety    takes Xanax daily  . Arthritis   . Chronic back pain    DDD  . Complication of anesthesia    agitated when awaking   . H/O rotator cuff tear   . History of kidney stones   . Hypertension    takes Toprol,Prinizide,and Amlodipine daily  . Joint pain   . Joint swelling   . Laceration    history of to right lower extermity   . Night muscle spasms    takes Soma daily  . Numbness and tingling    hands and feet   . Pneumonia    history of   . Tinnitus     Assessment: 58 yr old male presenting with CC L knee pain and in Afib RVR. Has known hx of Afib - pt was on apixaban in the past, but unable to afford.   Hgb 11.5, plt 164. In AKI with Scr elevated at 3.08 (CrCl 36.6 mL/min).   Heparin level ~5 hrs after heparin 5500 units IV bolus X 1, followed by initiation of heparin infusion at 1500 units/hr, was <0.10, which is below goal range. Per RN, no issues with IV or bleeding observed. Myrle Sheng, RN, rec'd order from Dr. Tamala Julian to hold  heparin now, as pt is scheduled to go to OR this evening (septic joint).  Goal of Therapy:  Heparin level 0.3-0.7 units/ml Monitor platelets by anticoagulation protocol: Yes   Plan:  Per Dr. Thompson Caul verbal order to RN, hold heparin infusion for ortho surgery this PM; F/U re: restarting heparin post op Monitor daily heparin level, CBC  Monitor for signs/symptoms of bleeding  Gillermina Hu, PharmD, BCPS, [facility] Clinical Pharmacist 07/13/2019,6:02 PM

## 2019-07-13 NOTE — Progress Notes (Signed)
ANTICOAGULATION CONSULT NOTE  Pharmacy Consult for heparin Indication: atrial fibrillation  No Known Allergies  Patient Measurements: Height: 6\' 3"  (190.5 cm) Weight: 260 lb 2.3 oz (118 kg) IBW/kg (Calculated) : 84.5 Heparin Dosing Weight: 109.3 kg  Vital Signs: Temp: 97.7 F (36.5 C) (12/16 2303) Temp Source: Axillary (12/16 2303) BP: 135/91 (12/16 2303) Pulse Rate: 130 (12/16 2303)  Labs: Recent Labs    07/12/19 1910 07/13/19 0117 07/13/19 0431 07/13/19 1245 07/13/19 1635  HGB 13.3  --  11.5*  --   --   HCT 40.3  --  34.8*  --   --   PLT 227  --  164  --   --   APTT  --  30  --   --   --   LABPROT  --  17.9*  --   --   --   INR  --  1.5*  --   --   --   HEPARINUNFRC  --   --   --  0.81* <0.10*  CREATININE 3.36*  --  3.08*  --   --     Estimated Creatinine Clearance: 36.6 mL/min (A) (by C-G formula based on SCr of 3.08 mg/dL (H)).  Assessment: 58 y.o. male with h/o Afib s/p I & D for heparin   Goal of Therapy:  Heparin level 0.3-0.7 units/ml Monitor platelets by anticoagulation protocol: Yes   Plan:  Restart heparin at 0700 tomorrow at 1750 units/hr Check heparin level in 6 hours.   Phillis Knack, PharmD, BCPS  07/13/2019,11:35 PM

## 2019-07-13 NOTE — Progress Notes (Signed)
PHARMACY - PHYSICIAN COMMUNICATION CRITICAL VALUE ALERT - BLOOD CULTURE IDENTIFICATION (BCID)  Vernon Frye is an 58 y.o. male who presented to [facility] on 07/12/2019 with a chief complaint of AFib RVR.  Name of physician (or Provider) Contacted: Nelda Marseille - CCM  Current antibiotics: daptomycin + Zosyn  Changes to prescribed antibiotics recommended:  Patient is on recommended antibiotics - No changes needed  Results for orders placed or performed during the hospital encounter of 07/12/19  Blood Culture ID Panel (Reflexed) (Collected: 07/13/2019  1:14 AM)  Result Value Ref Range   Enterococcus species NOT DETECTED NOT DETECTED   Listeria monocytogenes NOT DETECTED NOT DETECTED   Staphylococcus species DETECTED (A) NOT DETECTED   Staphylococcus aureus (BCID) DETECTED (A) NOT DETECTED   Methicillin resistance DETECTED (A) NOT DETECTED   Streptococcus species NOT DETECTED NOT DETECTED   Streptococcus agalactiae NOT DETECTED NOT DETECTED   Streptococcus pneumoniae NOT DETECTED NOT DETECTED   Streptococcus pyogenes NOT DETECTED NOT DETECTED   Acinetobacter baumannii NOT DETECTED NOT DETECTED   Enterobacteriaceae species NOT DETECTED NOT DETECTED   Enterobacter cloacae complex NOT DETECTED NOT DETECTED   Escherichia coli NOT DETECTED NOT DETECTED   Klebsiella oxytoca NOT DETECTED NOT DETECTED   Klebsiella pneumoniae NOT DETECTED NOT DETECTED   Proteus species NOT DETECTED NOT DETECTED   Serratia marcescens NOT DETECTED NOT DETECTED   Haemophilus influenzae NOT DETECTED NOT DETECTED   Neisseria meningitidis NOT DETECTED NOT DETECTED   Pseudomonas aeruginosa NOT DETECTED NOT DETECTED   Candida albicans NOT DETECTED NOT DETECTED   Candida glabrata NOT DETECTED NOT DETECTED   Candida krusei NOT DETECTED NOT DETECTED   Candida parapsilosis NOT DETECTED NOT DETECTED   Candida tropicalis NOT DETECTED NOT DETECTED    Arrie Senate, PharmD, BCPS Clinical Pharmacist Please check AMION  for all [facility] Pharmacy numbers 07/13/2019

## 2019-07-13 NOTE — ED Notes (Signed)
IV team at bedside 

## 2019-07-13 NOTE — ED Notes (Signed)
Will attempt to increase Cardizem once pt has consistent stable BP. Will continue to monitor

## 2019-07-13 NOTE — Progress Notes (Signed)
Pharmacy Antibiotic Note  Vernon Frye is a 58 y.o. male admitted on 07/12/2019 with sepsis - L knee swollen, red, and painful.  Pharmacy has been consulted for Vancomycin dosing.  SCr up to 3.3 (was 1.1 08/2018), est CrCl ~30 ml/min  Plan: Vancomycin 2gm IV now Will f/u renal function for further doses Will f/u micro data and pt's clinical condition Vanc levels prn     Temp (24hrs), Avg:97.4 F (36.3 C), Min:97.4 F (36.3 C), Max:97.4 F (36.3 C)  Recent Labs  Lab 07/12/19 1910 07/13/19 0119  WBC 16.0*  --   CREATININE 3.36*  --   LATICACIDVEN 4.2* 3.6*    CrCl cannot be calculated (Unknown ideal weight.).    No Known Allergies  Antimicrobials this admission: 12/16 Vanc >>   Microbiology results: 12/16 BCx:  L knee fluid: UCx:  Thank you for allowing pharmacy to be a part of this patient's care.  Sherlon Handing, PharmD, BCPS Please see amion for complete clinical pharmacist phone list 07/13/2019 3:40 AM

## 2019-07-13 NOTE — ED Notes (Signed)
Gave pt urine for urine sample. Instructed to let know when he goes too bathroom so we can get a sample

## 2019-07-13 NOTE — Anesthesia Postprocedure Evaluation (Signed)
Anesthesia Post Note  Patient: Vernon Frye  Procedure(s) Performed: IRRIGATION AND DEBRIDEMENT TOTAL  KNEE WITH POLY EXCHANGE (Left Knee)     Patient location during evaluation: PACU Anesthesia Type: General Level of consciousness: awake and alert Pain management: pain level controlled Vital Signs Assessment: post-procedure vital signs reviewed and stable Respiratory status: spontaneous breathing, nonlabored ventilation, respiratory function stable and patient connected to nasal cannula oxygen Cardiovascular status: blood pressure returned to baseline and stable Postop Assessment: no apparent nausea or vomiting Anesthetic complications: no    Last Vitals:  Vitals:   07/13/19 2230 07/13/19 2245  BP: (!) 153/110 131/89  Pulse: (!) 121 (!) 122  Resp: (!) 31 (!) 34  Temp:  (!) 36.4 C  SpO2: 98% 99%    Last Pain:  Vitals:   07/13/19 2245  TempSrc:   PainSc: 4                  [name] DAVID

## 2019-07-13 NOTE — ED Notes (Signed)
Paged MD about pt's lactic @ 2.3 this AM

## 2019-07-13 NOTE — Progress Notes (Signed)
[place] Progress Note Patient Name: Vernon Frye DOB: [birth year] MRN: [id number]   Date of Service  07/13/2019  HPI/Events of Note  Returns from OR extubated. RR = 42 and Sat = 96%.   eICU Interventions  Will order: 1. ABG STAT. 2. Portable CXR STAT.  3. Will ask ground team to evaluate the patient at bedside.      Intervention Category Intermediate Interventions: Respiratory distress - evaluation and management  Zadkiel Dragan Eugene 07/13/2019, 11:11 PM

## 2019-07-13 NOTE — Progress Notes (Addendum)
Spoke to [name] who is planning to take patient to surgery for left knee tonight. Requesting to stop heparin drip for procedure. I called and informed Dr. Tamala Julian of this who gave order to discontinue heparin for procedure. Heparin stopped at this time per Tamala Julian MD order. I then notified [name] who stated patient would go to surgery later this evening.   Myrle Sheng, BSN, RN 844M/44M MICU

## 2019-07-13 NOTE — Transfer of Care (Signed)
Immediate Anesthesia Transfer of Care Note  Patient: Vernon Frye  Procedure(s) Performed: IRRIGATION AND DEBRIDEMENT TOTAL  KNEE WITH POLY EXCHANGE (Left Knee)  Patient Location: PACU  Anesthesia Type:General  Level of Consciousness: awake and alert   Airway & Oxygen Therapy: Patient connected to face mask oxygen  Post-op Assessment: Report given to RN and Post -op Vital signs reviewed and stable  Post vital signs: Reviewed and stable  Last Vitals:  Vitals Value Taken Time  BP 134/78 07/13/19 2200  Temp    Pulse 120 07/13/19 2207  Resp 22 07/13/19 2207  SpO2 97 % 07/13/19 2207  Vitals shown include unvalidated device data.  Last Pain:  Vitals:   07/13/19 1500  TempSrc: Axillary  PainSc: 3       Patients Stated Pain Goal: 0 ([id number])  Complications: No apparent anesthesia complications

## 2019-07-13 NOTE — H&P (Signed)
History and Physical    Vernon Frye TOI:[id number] DOB: 10-11-1960 DOA: 07/12/2019  PCP: Nicholes Rough, PA-C Patient coming from: Home  Chief Complaint: Left knee pain and swelling  HPI: Vernon Frye is a 58 y.o. male with medical history significant of Afib, anxiety, arthritis, hypertension, left total knee arthroplasty in 2016 presenting with complaints of left knee pain and swelling.  Patient reports 1 day history of severe left knee pain and swelling.  States he has not been able to bear any weight on this leg and has not been able to bend the knee.  Denies fevers or chills.  States he was diagnosed with A. fib a few months ago and was supposed to be on Eliquis for anticoagulation but was not able to afford this medication.  As such, he has been taking aspirin only since his A. fib diagnosis.  Denies chest pain or heart palpitations.  Denies cough, shortness of breath, or recent sick contacts.  ED Course: Found to be in A. fib with RVR, heart rate in the 160s.  Afebrile.  WBC count 16.0 with left shift.  Lactic acid 4.2.  BUN 43, creatinine 3.3.  Creatinine was 1.1 in February 2020.  T bili 1.9, remainder of LFTs normal.  UA and urine culture pending.  Blood culture x2 pending.  ESR and CRP pending.  X-ray of left knee showing moderate joint effusion and no acute bony abnormality.  ED provider discussed the case with Dr. Stann Mainland and Dr. Ricard Dillon, orthopedics planning on doing arthrocentesis in the morning and recommending holding antibiotics.  Cardizem bolus and infusion ordered.  Fluid boluses ordered.  Review of Systems:  All systems reviewed and apart from history of presenting illness, are negative.  Past Medical History:  Diagnosis Date  . Anxiety    takes Xanax daily  . Arthritis   . Chronic back pain    DDD  . Complication of anesthesia    agitated when awaking   . H/O rotator cuff tear   . History of kidney stones   . Hypertension    takes Toprol,Prinizide,and Amlodipine  daily  . Joint pain   . Joint swelling   . Laceration    history of to right lower extermity   . Night muscle spasms    takes Soma daily  . Numbness and tingling    hands and feet   . Pneumonia    history of   . Tinnitus     Past Surgical History:  Procedure Laterality Date  . HERNIA REPAIR Bilateral as a baby   inguinal-  . LITHOTRIPSY    . SHOULDER ARTHROSCOPY WITH SUBACROMIAL DECOMPRESSION AND OPEN ROTATOR C Left 01/16/2014   Procedure: LEFT SHOULDER ARTHROSCOPY WITH SUBACROMIAL DECOMPRESSION AND MINI OPEN ROTATOR CUFF REPAIR, OPEN DISTAL CLAVICLE RESECTION AND TENDODESIS;  Surgeon: Augustin Schooling, MD;  Location: [facility];  Service: Orthopedics;  Laterality: Left;  . tens unit     for severe pain   . TOTAL KNEE ARTHROPLASTY Left 06/19/2015   Procedure: LEFT TOTAL KNEE ARTHROPLASTY;  Surgeon: Paralee Cancel, MD;  Location: [facility];  Service: Orthopedics;  Laterality: Left;     reports that he quit smoking about 14 years ago. His smoking use included cigarettes. He has a 1.75 pack-year smoking history. He has never used smokeless tobacco. He reports current alcohol use. He reports that he does not use drugs.  No Known Allergies  No family history on file.  Prior to Admission medications   Medication  Sig Start Date End Date Taking? Authorizing Provider  acetaminophen (TYLENOL) 500 MG tablet Take 2 tablets (1,000 mg total) by mouth every 8 (eight) hours. Patient not taking: Reported on 09/16/2018 06/20/15   Danae Orleans, PA-C  ALPRAZolam Duanne Moron) 1 MG tablet Take 1 tablet (1 mg total) by mouth daily as needed for anxiety or sleep. Patient taking differently: Take 0.5-1 mg by mouth 3 (three) times daily as needed for anxiety or sleep.  06/20/15   Danae Orleans, PA-C  apixaban (ELIQUIS) 5 MG TABS tablet Take 1 tablet (5 mg total) by mouth 2 (two) times daily for 30 days. 09/16/18 10/16/18  Davonna Belling, MD  diazePAM (VALIUM PO) Take 1 tablet by mouth once. Pt states he took a  valium he had 'stashed' on the morning of 09/16/2018    [provider]  diclofenac sodium (VOLTAREN) 1 % GEL Apply 1 application topically 4 (four) times daily. 09/07/18   [provider]  docusate sodium (COLACE) 100 MG capsule Take 1 capsule (100 mg total) by mouth 2 (two) times daily. Patient not taking: Reported on 09/16/2018 06/20/15   Danae Orleans, PA-C  ferrous sulfate 325 (65 FE) MG tablet Take 1 tablet (325 mg total) by mouth 3 (three) times daily after meals. Patient not taking: Reported on 06/29/2015 06/20/15   Danae Orleans, PA-C  HYDROmorphone (DILAUDID) 4 MG tablet Take 1-2 tablets (4-8 mg total) by mouth every 4 (four) hours as needed for severe pain. Patient not taking: Reported on 06/29/2015 06/20/15   Danae Orleans, PA-C  ibuprofen (ADVIL,MOTRIN) 200 MG tablet Take 400 mg by mouth every 6 (six) hours as needed for headache or mild pain.    [provider]  lisinopril-hydrochlorothiazide (PRINZIDE,ZESTORETIC) 20-12.5 MG per tablet Take 2 tablets by mouth daily.     [provider]  methocarbamol (ROBAXIN) 500 MG tablet Take 1 tablet (500 mg total) by mouth every 6 (six) hours as needed for muscle spasms. Patient not taking: Reported on 09/16/2018 06/20/15   Danae Orleans, PA-C  metoprolol succinate (TOPROL-XL) 50 MG 24 hr tablet Take 3 tablets (150 mg total) by mouth daily. 09/16/18   Davonna Belling, MD  morphine (MS CONTIN) 30 MG 12 hr tablet Take 30 mg by mouth every 8 (eight) hours. 09/09/18   [provider]  naproxen (NAPROSYN) 500 MG tablet Take 500 mg by mouth 2 (two) times daily as needed. pain 05/22/15   [provider]  oxyCODONE-acetaminophen (PERCOCET) 10-325 MG tablet Take 1 tablet by mouth every 6 (six) hours as needed. pain 06/07/15   [provider]  polyethylene glycol (MIRALAX / GLYCOLAX) packet Take 17 g by mouth 2 (two) times daily. Patient not taking: Reported on 06/29/2015 06/20/15   Danae Orleans, PA-C  polyvinyl alcohol (LIQUIFILM TEARS) 1.4 % ophthalmic solution Place 1 drop into both eyes as needed for dry eyes.    [provider]  psyllium (HYDROCIL/METAMUCIL) 95 % PACK Take 1 packet by mouth daily as needed for mild constipation.    [provider]    Physical Exam: Vitals:   07/13/19 0340 07/13/19 0350 07/13/19 0355 07/13/19 0400  BP: 91/62 97/68 96/66 96/70  Pulse: (!) 129 (!) 120 (!) 125 78  Resp: (!) 23 (!) 27 (!) 27 (!) 26  Temp:      TempSrc:      SpO2: 97% 98% 97% 94%    Physical Exam  Constitutional: He is oriented to person, place, and time. He appears well-developed and well-nourished.  No distress.  HENT:  Head: Normocephalic.  Eyes: Right eye exhibits no discharge. Left eye exhibits no discharge.  Cardiovascular: Regular rhythm and intact distal pulses.  Extremely tachycardic with heart rate in 150s  Pulmonary/Chest: Effort normal and breath sounds normal. No respiratory distress. He has no wheezes. He has no rales.  Abdominal: Soft. Bowel sounds are normal. He exhibits no distension. There is no abdominal tenderness. There is no guarding.  Musculoskeletal:     Cervical back: Neck supple.     Comments: Left knee: Significant edema and erythema noted.  Range of motion severely limited.  Neurological: He is alert and oriented to person, place, and time.  Skin: Skin is warm and dry. He is not diaphoretic.       Labs on Admission: I have personally reviewed following labs and imaging studies  CBC: Recent Labs  Lab 07/12/19 1910  WBC 16.0*  NEUTROABS 13.5*  HGB 13.3  HCT 40.3  MCV 88.6  PLT 423   Basic Metabolic Panel: Recent Labs  Lab 07/12/19 1910  NA 130*  K 3.6  CL 91*  CO2 22  GLUCOSE 122*  BUN 43*  CREATININE 3.36*  CALCIUM 8.7*   GFR: CrCl cannot be calculated (Unknown ideal weight.). Liver Function Tests: Recent Labs  Lab 07/12/19 1910  AST 29  ALT 17  ALKPHOS 94  BILITOT 1.9*  PROT 6.9    ALBUMIN 2.5*   No results for input(s): LIPASE, AMYLASE in the last 168 hours. No results for input(s): AMMONIA in the last 168 hours. Coagulation Profile: Recent Labs  Lab 07/13/19 0117  INR 1.5*   Cardiac Enzymes: No results for input(s): CKTOTAL, CKMB, CKMBINDEX, TROPONINI in the last 168 hours. BNP (last 3 results) No results for input(s): PROBNP in the last 8760 hours. HbA1C: No results for input(s): HGBA1C in the last 72 hours. CBG: No results for input(s): GLUCAP in the last 168 hours. Lipid Profile: No results for input(s): CHOL, HDL, LDLCALC, TRIG, CHOLHDL, LDLDIRECT in the last 72 hours. Thyroid Function Tests: No results for input(s): TSH, T4TOTAL, FREET4, T3FREE, THYROIDAB in the last 72 hours. Anemia Panel: No results for input(s): VITAMINB12, FOLATE, FERRITIN, TIBC, IRON, RETICCTPCT in the last 72 hours. Urine analysis:    Component Value Date/Time   COLORURINE YELLOW 06/12/2015 1436   APPEARANCEUR CLEAR 06/12/2015 1436   LABSPEC 1.010 06/12/2015 1436   PHURINE 7.5 06/12/2015 1436   GLUCOSEU NEGATIVE 06/12/2015 1436   HGBUR NEGATIVE 06/12/2015 1436   BILIRUBINUR NEGATIVE 06/12/2015 1436   KETONESUR NEGATIVE 06/12/2015 1436   PROTEINUR NEGATIVE 06/12/2015 1436   NITRITE NEGATIVE 06/12/2015 1436   LEUKOCYTESUR NEGATIVE 06/12/2015 1436    Radiological Exams on Admission: DG Knee Complete 4 Views Left  Result Date: 07/12/2019 CLINICAL DATA:  Knee swelling and redness EXAM: LEFT KNEE - COMPLETE 4+ VIEW COMPARISON:  06/29/2015 FINDINGS: Prior left knee replacement, stable. Moderate joint effusion. No hardware complicating feature. No fracture, subluxation or dislocation. IMPRESSION: Prior left knee replacement. Moderate joint effusion. No acute bony abnormality. Electronically Signed   By: Rolm Baptise M.D.   On: 07/12/2019 19:30    EKG: Independently reviewed.  A. fib with RVR, heart rate 162.  ST changes likely rate related.  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  WBC count 16.0 with left shift.  Lactic acid 4.2.  ESR and CRP significantly elevated.  Left knee joint with significant swelling and erythema concerning for joint infection. X-ray of left knee showing moderate joint effusion and no acute bony abnormality.  -Orthopedics planning on doing arthrocentesis in the morning.  Labs will need to be sent off for synovitic fluid analysis. -Start antibiotics including vancomycin and ceftriaxone given severe sepsis.  I have discussed this with Dr. Stann Mainland. -Continue IV fluid resuscitation.  Patient has received 2 L fluid boluses so far and blood pressure soft.  Additional 1 L fluid bolus ordered. -Blood culture x2 pending -Continue to monitor WBC count -Trend lactate -Tylenol as needed for pain.  Unable to give NSAIDs due to AKI.  Avoid narcotics at this time given low blood pressure.   A. fib with RVR Likely precipitating factor is infection.  Rate currently up to 150s. -Started on Cardizem infusion but blood pressure soft.  Patient will be receiving additional fluid bolus and if blood pressure improves, Cardizem dose can be titrated up.  If blood pressure continues to be soft, consider switching to amiodarone. -CHA2DS2VASc 1.  Patient states he was prescribed Eliquis but was never able to start this medication due to high cost.  He is currently taking aspirin. -Check TSH level  AKI Suspect prerenal in the setting of severe sepsis. BUN 43, creatinine 3.3.  Creatinine was 1.1 in February 2020.  -IV fluid hydration -Continue to monitor renal function -Avoid nephrotoxic agents/contrast -Monitor urine output -Consider obtaining renal  ultrasound if no improvement in renal function on a.m. labs  Elevated T bili Possibly related to severe sepsis. T bili 1.9, remainder of LFTs normal.  Abdominal exam benign. -Continue to monitor LFTs -Consider obtaining right upper quadrant ultrasound if no improvement on a.m. labs  Mild hyponatremia Sodium 130.  Likely related to home thiazide diuretic use. -IV fluid hydration -Continue to monitor sodium level -Hold diuretic  Alcohol use disorder Patient reports drinking 3 beers daily.  Unclear when his last drink was. -CIWA protocol; Ativan as needed -Thiamine, folate, multivitamin  Pharmacy med rec pending.  DVT prophylaxis: SCDs at this time Code Status: Full code Family Communication: No family at this time. Disposition Plan: Anticipate discharge after clinical improvement. Consults called: [facility] Admission status: It is my clinical opinion that admission to [facility] is reasonable and necessary in this 58 y.o. male . 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  Also has A. fib with RVR with significantly elevated heart rate.  Receiving broad-spectrum antibiotics and will undergo arthrocentesis in the morning.  High risk of decompensation.  Given the aforementioned, the predictability of an adverse outcome is felt to be significant. I expect that the patient will require at least 2 midnights in the hospital to treat this condition.   The medical decision making on this patient was of high complexity and the patient is at high risk for clinical deterioration, therefore this is a level 3 visit.  Shela Leff MD [facility] Pager [phone]  If 7PM-7AM, please contact night-coverage [email or web address] Password [email or web address]  07/13/2019, 4:10 AM

## 2019-07-13 NOTE — Anesthesia Preprocedure Evaluation (Addendum)
Anesthesia Evaluation  Patient identified by MRN, date of birth, ID band Patient awake    Reviewed: Allergy & Precautions, NPO status , Patient's Chart, lab work & pertinent test results  Airway Mallampati: I  TM Distance: >3 FB Neck ROM: Full    Dental  (+) Poor Dentition   Pulmonary former smoker,    Pulmonary exam normal        Cardiovascular hypertension, Pt. on medications  Rhythm:Irregular     Neuro/Psych Anxiety    GI/Hepatic   Endo/Other    Renal/GU      Musculoskeletal   Abdominal   Peds  Hematology   Anesthesia Other Findings   Reproductive/Obstetrics                            Anesthesia Physical Anesthesia Plan  ASA: II  Anesthesia Plan: General   Post-op Pain Management:    Induction:   PONV Risk Score and Plan: Ondansetron and Midazolam  Airway Management Planned: LMA  Additional Equipment:   Intra-op Plan:   Post-operative Plan: Extubation in OR  Informed Consent: I have reviewed the patients History and Physical, chart, labs and discussed the procedure including the risks, benefits and alternatives for the proposed anesthesia with the patient or authorized representative who has indicated his/her understanding and acceptance.       Plan Discussed with: CRNA and Surgeon  Anesthesia Plan Comments:         Anesthesia Quick Evaluation

## 2019-07-13 NOTE — Progress Notes (Signed)
CRITICAL VALUE ALERT  Critical Value:  MRSA PCR POSITIVE  Date & Time Notifed: 07/13/19 @ [id number]     Provider Notified: Eliseo Gum NP  Orders Received/Actions taken: No Orders received at this time

## 2019-07-13 NOTE — ED Notes (Signed)
Pharmacy notified about pt's reaction to antibiotic. Orders placed for reaction

## 2019-07-14 DIAGNOSIS — T8454XA Infection and inflammatory reaction due to internal left knee prosthesis, initial encounter: Principal | ICD-10-CM

## 2019-07-14 DIAGNOSIS — R0682 Tachypnea, not elsewhere classified: Secondary | ICD-10-CM

## 2019-07-14 DIAGNOSIS — F419 Anxiety disorder, unspecified: Secondary | ICD-10-CM

## 2019-07-14 DIAGNOSIS — Z9114 Patient's other noncompliance with medication regimen: Secondary | ICD-10-CM

## 2019-07-14 DIAGNOSIS — I10 Essential (primary) hypertension: Secondary | ICD-10-CM

## 2019-07-14 DIAGNOSIS — R451 Restlessness and agitation: Secondary | ICD-10-CM

## 2019-07-14 DIAGNOSIS — M00062 Staphylococcal arthritis, left knee: Secondary | ICD-10-CM

## 2019-07-14 DIAGNOSIS — F10239 Alcohol dependence with withdrawal, unspecified: Secondary | ICD-10-CM

## 2019-07-14 DIAGNOSIS — M1990 Unspecified osteoarthritis, unspecified site: Secondary | ICD-10-CM

## 2019-07-14 DIAGNOSIS — M25562 Pain in left knee: Secondary | ICD-10-CM | POA: Diagnosis present

## 2019-07-14 DIAGNOSIS — B9562 Methicillin resistant Staphylococcus aureus infection as the cause of diseases classified elsewhere: Secondary | ICD-10-CM

## 2019-07-14 DIAGNOSIS — Z87891 Personal history of nicotine dependence: Secondary | ICD-10-CM

## 2019-07-14 DIAGNOSIS — T8450XA Infection and inflammatory reaction due to unspecified internal joint prosthesis, initial encounter: Secondary | ICD-10-CM | POA: Diagnosis present

## 2019-07-14 DIAGNOSIS — F1910 Other psychoactive substance abuse, uncomplicated: Secondary | ICD-10-CM

## 2019-07-14 DIAGNOSIS — R7881 Bacteremia: Secondary | ICD-10-CM | POA: Diagnosis present

## 2019-07-14 LAB — CBC
HCT: 32.5 % — ABNORMAL LOW (ref 39.0–52.0)
Hemoglobin: 11.1 g/dL — ABNORMAL LOW (ref 13.0–17.0)
MCH: 29.5 pg (ref 26.0–34.0)
MCHC: 34.2 g/dL (ref 30.0–36.0)
MCV: 86.4 fL (ref 80.0–100.0)
Platelets: 162 10*3/uL (ref 150–400)
RBC: 3.76 MIL/uL — ABNORMAL LOW (ref 4.22–5.81)
RDW: 14.5 % (ref 11.5–15.5)
WBC: 16.4 10*3/uL — ABNORMAL HIGH (ref 4.0–10.5)
nRBC: 0 % (ref 0.0–0.2)

## 2019-07-14 LAB — URIC ACID, BODY FLUID: Uric Acid Body Fluid: 6.6 mg/dL

## 2019-07-14 LAB — BASIC METABOLIC PANEL
Anion gap: 15 (ref 5–15)
BUN: 55 mg/dL — ABNORMAL HIGH (ref 6–20)
CO2: 19 mmol/L — ABNORMAL LOW (ref 22–32)
Calcium: 7.6 mg/dL — ABNORMAL LOW (ref 8.9–10.3)
Chloride: 98 mmol/L (ref 98–111)
Creatinine, Ser: 2.42 mg/dL — ABNORMAL HIGH (ref 0.61–1.24)
GFR calc Af Amer: 33 mL/min — ABNORMAL LOW (ref >60)
GFR calc non Af Amer: 28 mL/min — ABNORMAL LOW (ref >60)
Glucose, Bld: 174 mg/dL — ABNORMAL HIGH (ref 70–99)
Potassium: 4.4 mmol/L (ref 3.5–5.1)
Sodium: 132 mmol/L — ABNORMAL LOW (ref 135–145)

## 2019-07-14 LAB — GLUCOSE, CAPILLARY: Glucose-Capillary: 164 mg/dL — ABNORMAL HIGH (ref 70–99)

## 2019-07-14 LAB — PROTEIN, BODY FLUID (OTHER): Total Protein, Body Fluid Other: 3 g/dL

## 2019-07-14 LAB — URINE CULTURE: Culture: NO GROWTH

## 2019-07-14 LAB — CK: Total CK: 126 U/L (ref 49–397)

## 2019-07-14 LAB — PHOSPHORUS: Phosphorus: 3.5 mg/dL (ref 2.5–4.6)

## 2019-07-14 LAB — HEPARIN LEVEL (UNFRACTIONATED): Heparin Unfractionated: <0.1 IU/mL — ABNORMAL LOW (ref 0.30–0.70)

## 2019-07-14 LAB — MAGNESIUM: Magnesium: 2.1 mg/dL (ref 1.7–2.4)

## 2019-07-14 LAB — GLUCOSE, BODY FLUID OTHER: Glucose, Body Fluid Other: <2 mg/dL

## 2019-07-14 LAB — PATHOLOGIST SMEAR REVIEW

## 2019-07-14 MED ORDER — TRANEXAMIC ACID-NACL 1000-0.7 MG/100ML-% IV SOLN
1000.0000 mg | Freq: Once | INTRAVENOUS | Status: AC
  Administered 2019-07-14: 1000 mg via INTRAVENOUS
  Filled 2019-07-14 (×2): qty 100

## 2019-07-14 MED ORDER — DEXMEDETOMIDINE HCL IN NACL 400 MCG/100ML IV SOLN
0.2000 ug/kg/h | INTRAVENOUS | Status: DC
  Administered 2019-07-14: 0.2 ug/kg/h via INTRAVENOUS
  Administered 2019-07-14: 07:00:00 0.5 ug/kg/h via INTRAVENOUS
  Filled 2019-07-14 (×2): qty 100

## 2019-07-14 MED ORDER — PHENOL 1.4 % MT LIQD
1.0000 | OROMUCOSAL | Status: DC | PRN
  Filled 2019-07-14: qty 177

## 2019-07-14 MED ORDER — SODIUM CHLORIDE 0.9 % IV SOLN: INTRAVENOUS | Status: DC

## 2019-07-14 MED ORDER — HYDROCODONE-ACETAMINOPHEN 7.5-325 MG PO TABS: 1.0000 | ORAL_TABLET | ORAL | Status: DC | PRN

## 2019-07-14 MED ORDER — HYDROCODONE-ACETAMINOPHEN 5-325 MG PO TABS: 1.0000 | ORAL_TABLET | ORAL | Status: DC | PRN

## 2019-07-14 MED ORDER — DOCUSATE SODIUM 100 MG PO CAPS
100.0000 mg | ORAL_CAPSULE | Freq: Two times a day (BID) | ORAL | Status: DC
  Administered 2019-07-16 – 2019-07-26 (×12): 100 mg via ORAL
  Filled 2019-07-14 (×16): qty 1

## 2019-07-14 MED ORDER — METOCLOPRAMIDE HCL 10 MG PO TABS: 5.0000 mg | ORAL_TABLET | Freq: Three times a day (TID) | ORAL | Status: DC | PRN

## 2019-07-14 MED ORDER — SODIUM CHLORIDE 0.9 % IV SOLN
1000.0000 mg | Freq: Every day | INTRAVENOUS | Status: DC
  Administered 2019-07-14 – 2019-07-18 (×5): 1000 mg via INTRAVENOUS
  Filled 2019-07-14 (×6): qty 20

## 2019-07-14 MED ORDER — ORAL CARE MOUTH RINSE
15.0000 mL | Freq: Two times a day (BID) | OROMUCOSAL | Status: DC
  Administered 2019-07-14: 11:00:00 15 mL via OROMUCOSAL

## 2019-07-14 MED ORDER — MORPHINE SULFATE (PF) 2 MG/ML IV SOLN
0.5000 mg | INTRAVENOUS | Status: DC | PRN
  Administered 2019-07-15 (×2): 1 mg via INTRAVENOUS
  Filled 2019-07-14 (×2): qty 1

## 2019-07-14 MED ORDER — ONDANSETRON HCL 4 MG PO TABS: 4.0000 mg | ORAL_TABLET | Freq: Four times a day (QID) | ORAL | Status: DC | PRN

## 2019-07-14 MED ORDER — SODIUM CHLORIDE 0.9 % IV BOLUS
500.0000 mL | Freq: Once | INTRAVENOUS | Status: AC
  Administered 2019-07-14: 500 mL via INTRAVENOUS

## 2019-07-14 MED ORDER — ACETAMINOPHEN 325 MG PO TABS: 325.0000 mg | ORAL_TABLET | Freq: Four times a day (QID) | ORAL | Status: DC | PRN

## 2019-07-14 MED ORDER — ONDANSETRON HCL 4 MG/2ML IJ SOLN
4.0000 mg | Freq: Four times a day (QID) | INTRAMUSCULAR | Status: DC | PRN
  Administered 2019-07-26: 14:00:00 4 mg via INTRAVENOUS

## 2019-07-14 MED ORDER — MENTHOL 3 MG MT LOZG
1.0000 | LOZENGE | OROMUCOSAL | Status: DC | PRN
  Filled 2019-07-14: qty 9

## 2019-07-14 MED ORDER — DEXMEDETOMIDINE HCL IN NACL 400 MCG/100ML IV SOLN
0.4000 ug/kg/h | INTRAVENOUS | Status: DC
  Administered 2019-07-14: 0.8 ug/kg/h via INTRAVENOUS
  Administered 2019-07-14: 12:00:00 0.6 ug/kg/h via INTRAVENOUS
  Administered 2019-07-14: 22:00:00 0.8 ug/kg/h via INTRAVENOUS
  Administered 2019-07-14: 08:00:00 0.5 ug/kg/h via INTRAVENOUS
  Administered 2019-07-15 (×2): 0.8 ug/kg/h via INTRAVENOUS
  Administered 2019-07-15: 06:00:00 0.6 ug/kg/h via INTRAVENOUS
  Administered 2019-07-16 (×2): 0.8 ug/kg/h via INTRAVENOUS
  Filled 2019-07-14 (×8): qty 100

## 2019-07-14 MED ORDER — METOCLOPRAMIDE HCL 5 MG/ML IJ SOLN: 5.0000 mg | Freq: Three times a day (TID) | INTRAMUSCULAR | Status: DC | PRN

## 2019-07-14 NOTE — Progress Notes (Signed)
Pt agitated, combative, belligerent & threatening to kill staff members.  Dr. [name] made aware at this time via [place].  Awaiting new orders.

## 2019-07-14 NOTE — Progress Notes (Signed)
PT Cancellation Note  Patient Details Name: Vernon Frye MRN: [id number] DOB: 01-22-1961   Cancelled Treatment:    Reason Eval/Treat Not Completed: Patient not medically ready. Pt has been agitated.    [place] 07/14/2019, 11:12 AM [place] Pager [phone] Office [phone]

## 2019-07-14 NOTE — Progress Notes (Signed)
[facility] signoff note   The care of this patient has been transferred from the [facility] service to the following service:  Service: PCCM Attending: Dr. Nelda Marseille. I have discussed with Dr. Lynetta Mare Date & time: 07/14/2019, 2:30 PM  Chart briefly reviewed.  Went by to 2 M this morning.  Discussed with patient's RN.  Remains on IV Precedex drip, IV heparin and amiodarone drips.  Overnight was on BiPAP.  Overnight had issues with agitation.  Remains critically ill and at risk for decompensation.   Please re consult the [facility] team for any further assistance.  Thank you   [employer]. MD [facility]   To contact a [facility] provider, please log into the web site [email or web address] and access using universal [place] password for that web site. If you do not have the password, please call the hospital operator. Page "Bend" listed on top of the page. Provide a number where you can be directly reached.

## 2019-07-14 NOTE — Progress Notes (Signed)
[facility] Progress Note Patient Name: Vernon Frye DOB: August 28, 1960 MRN: [id number]   Date of Service  07/14/2019  HPI/Events of Note  Severe agitation - Request for BiPAP. Patient can't be restrained d/t BiPAP.   eICU Interventions  Will order: 1. Precedex IV infusion. Titrate to RASS = 0.      Intervention Category Major Interventions: Delirium, psychosis, severe agitation - evaluation and management  Harvie Morua [place] 07/14/2019, 1:59 AM

## 2019-07-14 NOTE — Progress Notes (Addendum)
PCCM Interval Progress Note  Called and asked to assess pt at bedside for increased WOB and concern for intubation.  RR in mid to high 30's.  CXR with atx.  ABG normal (7.35 / 36/ 72). Mental status intact.  Follows basic commands.  Will use BiPAP PRN for work of breathing only. No need for intubation.  Montey Hora, [facility] 07/14/2019, 12:01 AM

## 2019-07-14 NOTE — Progress Notes (Signed)
NAME:  Vernon Frye, MRN:  [id number], DOB:  1961-07-04, LOS: 1 ADMISSION DATE:  07/12/2019, CONSULTATION DATE:  07/13/19 REFERRING MD:  Algis Liming - [facility], CHIEF COMPLAINT:  L knee pain  Brief History   58 yo M presents with L knee pair, prior L TKR   History of present illness   History obtained from chart as patient states "I will not repeat myself again, not after that guy."   58 yo M PMH Afib, HTN, L TKR (2016) who presented to ED with CC L knee pain. Pain began 12/14 and has progressed prompting presentation. Patient endorses L knee edema, L knee erythema onset in this time period and also progressive in nature.  Denied SOB, chest pain. Denies recreational drug use. Denies sick contacts.   In ED, A fib RVR, started empirically on abx for suspected septic joint. During volume resuscitation and vanc administration, patient developed erythematous, blanching facial rash extending down neck to clavicle. Vanc was stopped and orders for benadryl, Pepcid placed. Became tachypnic and desaturated to 88%, started on 3LNC with SpO2 now 100%. PCCM consulted for admission.  No angioedema on my exam and patient denies SOB or difficulty breathing, he does state that his mouth is dry.   Relevant ED labs:  Na 130, K 3.8, Cl 95, Co2 21 BUN 44 Cr 3.08 Lactic Acid 3.3, AG 14 Phos 3.1, Mag 1.0, Alb 1.0  Alk Phos 68, AST 24, ALT 14, Tbili 1.6  Past Medical History  A fib HTN Anxiety L TKR  L shoulder arthroscopy  DDD  Significant Hospital Events   12/16 presents to ED for L knee pain. Concern for septic joint. A fiv RVR. Developed SOB after vanc administration. PCCM consulted for admission   Consults:  Ortho   Procedures:  12/16 left knee Irrigation and debridement, total knee with poly exchange    Significant Diagnostic Tests:  12/16 ECHO >  12/16 CXR > no effusion, no pneumothorax, possible mild edema  12/15 L knee XR > prior L knee replacement. Moderate joint effusion, no body  abnormality  12/16 Renal ultrasound > Mild left-sided hydronephrosis is noted. Micro Data:  12/16 SARS Cov2 >negative  12/16 BCx > positive Staph MRSA  12/16 UCx > 12/16 Strep A > Negative  12/16 Flu A/B > neg 12/16 MRSA PCR > positive  12/16 Body culture left knee > Abundant gram positive cocci   Antimicrobials:  12/16 vanc x1 12/16 ceftriaxone x1 12/16 Zosyn > 12/16 Daptomycin 12/17 >  Interim history/subjective:  RN reports significant agitation and combativeness overnight, improved with initiation of Precedex. Appears to be in early ETOH withdrawal.   Objective   Blood pressure (!) 85/56, pulse 90, temperature 98.6 F (37 C), temperature source Axillary, resp. rate (!) 21, height '6\' 3"'  (1.905 m), weight 122.4 kg, SpO2 97 %.    FiO2 (%):  [30 %] 30 %   Intake/Output Summary (Last 24 hours) at 07/14/2019 0828 Last data filed at 07/14/2019 0600 Gross per 24 hour  Intake 2430.98 ml  Output 1485 ml  Net 945.98 ml   Filed Weights   07/13/19 0900 07/14/19 0344  Weight: 118 kg 122.4 kg    Examination: General: Adult male lying in bed sleepy on Precedex drip, in NAD HEENT: [place]/AT, MM pink/moist, PERRL,  Neuro: Sedated on precedex drip, unable to follow commands, spontaneous movement of extremities  CV: s1s2 regular rate and rhythm, no murmur, rubs, or gallops,  PULM:  Clear to ascultation bilaterally, no increased work  of breathing GI: soft, bowel sounds active in all 4 quadrants, non-tender, non-distended Extremities: warm/dry, no edema, Surgical dressing and ace wrap to left knee, neurovascularly intact  Skin: no rashes or lesions  Resolved Hospital Problem list     Assessment & Plan:   MRSA Bacteremia  -Likely secondary to infected left knee -MRSA PCR positive  P Continue IV daptomycin Follow ECHO results  Repeat Blood culture daily until negative  ID following, appreciate assistance   L Knee pain -suspect septic joint in setting of L TKR in 2016 P Ortho  following appreciate assistance  Underwent I&D with partial hardwear exchange 12/16 Continue IV antibiotics as above  Follow body cultures from knee   Acute Respiratory Insufficiency with hypoxia -following fluid bolus in ED, crackles on exam, most suspicious for pulm edema -Covid neg , Flu A/B neg  -low suspicion for PE Glossitis, mild  -Hx etoh -- wonder if there is degree of B vit deficiency here  Sore throat, tonsillar exudate  -Strep A negative  P Advance diet once mentation allows Gentle IV hydration  Continue supplemental oxygen sat goal > 91% ECHO pending   AKI -Hx kidney stones  -Hx urinary retention  P Follow renal function / urine output Trend Bmet Avoid nephrotoxins, ensure adequate renal perfusion  IV hydration Renal ultrasound with mild left-sided hydronephrosis  Afib RVR -non-compliant w eliquis outpatient P Continue Amiodarone drip Heparin drip  Continuous telemetry   Concern for allergic drug reaction  -erythematous rash, tachypnea after IVF and vanc initiation. Most c/w [place] + pulm edema. 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 Substance Use  -denies substance use; UDS pos for Amphetamines, BZD, Opioids (Home opioids for pain, BZD for anxiety) P Continue Vitamin B supplementation as above  CIWA scale q4 Supplemental thiamin, folate, and multivitamin  Precedex drip and PRN Ativan for agitation   Hypomagnesemia / Hyponatremia  -suspect r/t Hx EtOH as above P Supplement as needed  Trend      Best practice:  Diet: NPO Pain/Anxiety/Delirium protocol (if indicated): na VAP protocol (if indicated): na DVT prophylaxis: heparin  GI prophylaxis: pepcid  Glucose control: monitor Mobility: BR Code Status: Full  Family Communication: Patient updated  Disposition: ICU  Labs   CBC: Recent Labs  Lab 07/12/19 1910 07/13/19 0431 07/13/19 2357  07/14/19 0600  WBC 16.0* 12.9*  --  16.4*  NEUTROABS 13.5*  --   --   --   HGB 13.3 11.5* 11.6* 11.1*  HCT 40.3 34.8* 34.0* 32.5*  MCV 88.6 88.1  --  86.4  PLT 227 164  --  562    Basic Metabolic Panel: Recent Labs  Lab 07/12/19 1910 07/13/19 0431 07/13/19 2357  NA 130* 130* 130*  K 3.6 3.8 3.9  CL 91* 95*  --   CO2 22 21*  --   GLUCOSE 122* 132*  --   BUN 43* 44*  --   CREATININE 3.36* 3.08*  --   CALCIUM 8.7* 8.1*  --   MG  --  1.0*  --   PHOS  --  3.1  --    GFR: Estimated Creatinine Clearance: 36.9 mL/min (A) (by C-G formula based on SCr of 3.08 mg/dL (H)). Recent Labs  Lab 07/12/19 1910 07/13/19 0119 07/13/19 0335 07/13/19 0431 07/13/19 0513 07/14/19 0600  PROCALCITON  --   --   --  8.28  --   --  WBC 16.0*  --   --  12.9*  --  16.4*  LATICACIDVEN 4.2* 3.6* 3.3*  --  2.4*  --     Liver Function Tests: Recent Labs  Lab 07/12/19 1910 07/13/19 0431  AST 29 24  ALT 17 14  ALKPHOS 94 68  BILITOT 1.9* 1.6*  PROT 6.9 5.3*  ALBUMIN 2.5* 1.9*   No results for input(s): LIPASE, AMYLASE in the last 168 hours. No results for input(s): AMMONIA in the last 168 hours.  ABG    Component Value Date/Time   PHART 7.355 07/13/2019 2357   PCO2ART 35.9 07/13/2019 2357   PO2ART 72.0 (L) 07/13/2019 2357   HCO3 20.2 07/13/2019 2357   TCO2 21 (L) 07/13/2019 2357   ACIDBASEDEF 5.0 (H) 07/13/2019 2357   O2SAT 94.0 07/13/2019 2357     Coagulation Profile: Recent Labs  Lab 07/13/19 0117  INR 1.5*    Cardiac Enzymes: No results for input(s): CKTOTAL, CKMB, CKMBINDEX, TROPONINI in the last 168 hours.  HbA1C: Hgb A1c MFr Bld  Date/Time Value Ref Range Status  07/13/2019 04:08 PM 6.3 (H) 4.8 - 5.6 % Final    Comment:    (NOTE) Pre diabetes:          5.7%-6.4% Diabetes:              >6.4% Glycemic control for   <7.0% adults with diabetes     CBG: Recent Labs  Lab 07/13/19 1058 07/13/19 2309  GLUCAP 79 141*     Critical care time: 45 min    Performed by: Johnsie Cancel   Total critical care time: 39 minutes  Critical care time was exclusive of separately billable procedures and treating other patients.  Critical care was necessary to treat or prevent imminent or life-threatening deterioration.  Critical care was time spent personally by me on the following activities: development of treatment plan with patient and/or surrogate as well as nursing, discussions with consultants, evaluation of patient's response to treatment, examination of patient, obtaining history from patient or surrogate, ordering and performing treatments and interventions, ordering and review of laboratory studies, ordering and review of radiographic studies, pulse oximetry and re-evaluation of patient's condition.  Johnsie Cancel, NP-C [facility] Contact / Pager information can be found on Amion  07/14/2019, 9:13 AM

## 2019-07-14 NOTE — Op Note (Signed)
NAME: Vernon Frye, Vernon Frye MEDICAL RECORD [id number] ACCOUNT [id number] DATE OF BIRTH:09-Feb-1961 FACILITY: [facility] LOCATION: [place], [place]  OPERATIVE REPORT  DATE OF PROCEDURE:  07/13/2019  PREOPERATIVE DIAGNOSIS:  Septic left total knee replacement, presenting with sepsis.  POSTOPERATIVE DIAGNOSIS:  Septic left total knee replacement, presenting with sepsis.  PROCEDURE:  Excisional and nonexcisional debridement of left knee.  This included sharp excisional debridement using a scalpel and Bovie cautery of a 10-inch incision including the skin, subcutaneous tissue, scar, nonviable tissue, as well as some bone  as well as a non-excisional debridement with 6 liters normal saline solution as well as 900 mL of Irrisept chlorhexidine solution, which was kept in the wound for recommended 5 minutes.  I then removed previously placed polyethylene and replaced it with  a new size 7 mm to match a 7 femur, inserted for an Attune knee replacement.  SURGEON:  Paralee Cancel, MD  ASSISTANT:  Surgical team  ANESTHESIA:  General.  ESTIMATED BLOOD LOSS:  Minimal.  TOURNIQUET:  Up for approximately 50 minutes at 250 mmHg.  COMPLICATIONS:  None.  DRAINS:  None.  SPECIMENS:  No specimen was taken as we sent fluid to the lab early in the day as well as labs sent to [facility] for [facility] evaluation.  INDICATIONS:  The patient is a 58 year old male with a history of left total knee replacement that was performed by myself in 2016.  He stated that he was in his normal state of health without any problems or challenges with his knee and no recent  procedures or any other issues until 2 days ago when he noticed the onset of knee pain.  This progressed.  Unfortunately, as well as his onset of knee pain he became very sick and was brought to the emergency room yesterday.  He is admitted to the  hospital with concerns for sepsis.  I saw him in the intensive care unit and aspirated  120 mL of purulent fluid from his knee.  Based on these findings, it was confirmed that surgery was indicated.  The risks and benefits and necessity of a single stage  treatment were discussed.  He did test positive for MRSA and has been placed on daptomycin.  The hope is with this excisional debridement and poly exchange that we will cure his knee with 6 weeks of IV antibiotics and not need further treatment.  Consent  was obtained for benefit of infection management and treatment.  DESCRIPTION OF PROCEDURE:  The patient was brought to the operative theater.  Once adequate anesthesia, preoperative antibiotics, which had already been administered as a routine daptomycin dosage, he was positioned supine with a left thigh tourniquet  placed.  Left lower extremity was prepped and draped in sterile fashion.  Timeout was performed identifying the patient, planned procedure, and extremity.  His leg was exsanguinated, tourniquet elevated to 250 mmHg.  His old incision was demarcated and  excised and extended slightly proximal and distal.  The arthrotomy was made and still despite 120 mL aspiration earlier, there was still a very large effusion of purulence inside the knee.  This was evacuated.  At this point, I performed a significant  sharp excisional debridement and synovectomy beginning with the medial gutter, then the lateral gutter and then around the patella and the suprapatellar pouch.  I then flexed the knee up and removed the old polyethylene insert and debrided posteriorly.   Once I was satisfied with this synovectomy, scar debridement as well as  removing some bone laterally, we irrigated the knee with 3 liters normal saline solution.  Once I completed this, I placed about as much of the Irrisept fluid that I could keep in  the knee for a total of 5 minutes.  We then washed this out with another 3 liters normal saline solution.  Once this was completed,  we all changed gloves and inserted a new 7 mm  insert to match the 7 femur.  Inside the knee I did sprinkle 1 gram of  vancomycin powder.  The extensor mechanism was reapproximated using #1 Vicryl and 0 V-Loc suture.  The remainder of the wound was closed with 2-0 Vicryl and a running Monocryl stitch.  The knee was then cleaned, dried and dressed sterilely with surgical  glue and Aquacel dressing.  The patient was then brought to the recovery room in stable condition tolerating the procedure well.  Postoperatively, the patient will be admitted back to the intensive care unit.  He will receive IV antibiotics and be monitored for improvement in his clinical presentation.  The need for future surgery will be determined based on his response to this  treatment.  He will be seen by physical therapy and will be weightbearing as tolerated and can begin working on range of motion.  CN/NUANCE  D:07/13/2019 T:07/14/2019 JOB:[id number]

## 2019-07-14 NOTE — Progress Notes (Signed)
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  Course has been complicated with atrial fibrillation with RVR,  agitation and combativeness with concern for early alcohol withdrawal/polysubstance abuse requiring Precedex and lorazepam as well as acute kidney injury.  TTE results remain pending. All likely related to his prosthetic joint infection and bacteremia. Repeat blood cultures are in process.  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  May need a line holiday depending upon clearance of blood cultures.  He will require prolonged therapy with at least 6 weeks of IV antibiotics.  From healthcare maintenance perspective HIV nonreactive hepatitis C antibody screen.  PLAN:  1. Continue current dose of daptomycin. 2. Therapeutic drug monitoring of CK levels while on daptomycin. 3. Await TTE results and likely will require TEE to rule out endocarditis. 4. Wound care and surgical interventions per orthopedics. 5. Monitor renal function for improvement of AKI.  6. Atrial fibrillation per cardiology 7. Polysubstance abuse/alcohol abuse/withdrawals per critical care medicine. 8. Check hepatitis C antibody for routine screening.   Principal Problem:   MRSA bacteremia Active Problems:   Septic joint ([employer])   Prosthetic joint infection ([place])   Atrial fibrillation with rapid ventricular response (HCC)   AKI (acute kidney injury) ([place])   Hyponatremia   Severe sepsis (HCC)   Severe sepsis with acute organ  dysfunction (HCC)   Respiratory insufficiency   Acute pain of left knee   . Chlorhexidine Gluconate Cloth  6 each Topical Daily  . docusate sodium  100 mg Oral BID  . folic acid  1 mg Oral Daily  . multivitamin with minerals  1 tablet Oral Daily  . thiamine  100 mg Oral Daily   Or  . thiamine  100 mg Intravenous Daily     HPI: Vernon Frye is a 58 y.o. male with previous medical history significant for anxiety, arthritis, hypertension,  atrial fibrillation (prescribed Eliquis but not taking), and left total knee arthroplasty in 2016 admitted with left knee pain and swelling with concern for prosthetic joint infection/septic joint.  Vernon Frye was afebrile and found to have atrial fibrillation with rapid ventricular response with heart rate in the 160s as well as leukocytosis with a white blood cell count of 16.0 with left shift.  Creatinine elevated to 3.3 with previous baseline around 1.1 in February 2020.  Toxicology screen positive for opiates, benzodiazepines, and amphetamines.  X-ray of the left knee with moderate joint effusion and no acute bony abnormalities.  Arthrocentesis performed with turbid appearing synovial fluid without crystals and white blood cell count of 96,500.  Differentiation was unable to be completed due to degenerated cells.  Synovial fluid with Gram stain positive for gram-positive cocci and cultures positive for Staph aureus.  Blood cultures positive in 4 out of 4 bottles for MRSA.  Orthopedics consulted and was taken to the OR by [name] for irrigation debridement of total knee with poly exchange.  Vernon Frye has remained afebrile  since admission with most recent white blood cell count of 16.4.  Initially given vancomycin and ceftriaxone in the ED and was noted to experience an erythematous, blanching facial rash extending down neck to his clavicle.  Vancomycin was stopped and he was changed to daptomycin with ceftriaxone.  Repeat cultures drawn on 07/13/2019 are  currently in process.  He did have a midline placed in his left upper extremity on 12/16.  He was transferred to the ICU due to the reaction with vancomycin.  Echocardiogram has been completed with results pending.  Vernon Frye was reportedly agitated and combative overnight and started on Precedex with concern for early alcohol withdrawal.  He appears to be sedated during my initial interview and on Precedex.  Review of Systems: Review of Systems  Unable to perform ROS: Medical condition     Past Medical History:  Diagnosis Date  . Anxiety    takes Xanax daily  . Arthritis   . Chronic back pain    DDD  . Complication of anesthesia    agitated when awaking   . H/O rotator cuff tear   . History of kidney stones   . Hypertension    takes Toprol,Prinizide,and Amlodipine daily  . Joint pain   . Joint swelling   . Laceration    history of to right lower extermity   . Night muscle spasms    takes Soma daily  . Numbness and tingling    hands and feet   . Pneumonia    history of   . Tinnitus     Social History   Tobacco Use  . Smoking status: Former Smoker    Packs/day: 0.25    Years: 7.00    Pack years: 1.75    Types: Cigarettes    Quit date: 07/28/2004    Years since quitting: 14.9  . Smokeless tobacco: Never Used  . Tobacco comment: quit smoking 8 yrs ago  Substance Use Topics  . Alcohol use: Yes    Comment: 3 beers daily  . Drug use: No    Family History  Problem Relation Age of Onset  . Heart disease Other     No Known Allergies  OBJECTIVE: Blood pressure (!) 87/60, pulse 96, temperature 98.6 F (37 C), temperature source Axillary, resp. rate (!) 29, height 6\' 3"  (1.905 m), weight 122.4 kg, SpO2 100 %.  Physical Exam Constitutional:      General: He is not in acute distress.    Appearance: He is well-developed. He is ill-appearing.     Interventions: He is sedated.  Cardiovascular:     Rate and Rhythm: Normal rate and regular rhythm.     Heart sounds:  Normal heart sounds.     Comments: Left midline appears with dressing that is clean and dry no evidence of infection. Pulmonary:     Effort: Pulmonary effort is normal.     Breath sounds: Normal breath sounds.  Musculoskeletal:     Comments: Left knee with surgical dressing in place and is clean and dry.  Skin:    General: Skin is warm and dry.  Neurological:     Mental Status: He is alert and oriented to person, place, and time.  Psychiatric:        Behavior: Behavior normal.        Thought Content: Thought content normal.        Judgment: Judgment normal.     Lab Results Lab Results  Component Value Date   WBC 16.4 (  H) 07/14/2019   HGB 11.1 (L) 07/14/2019   HCT 32.5 (L) 07/14/2019   MCV 86.4 07/14/2019   PLT 162 07/14/2019    Lab Results  Component Value Date   CREATININE 2.42 (H) 07/14/2019   BUN 55 (H) 07/14/2019   NA 132 (L) 07/14/2019   K 4.4 07/14/2019   CL 98 07/14/2019   CO2 19 (L) 07/14/2019    Lab Results  Component Value Date   ALT 14 07/13/2019   AST 24 07/13/2019   ALKPHOS 68 07/13/2019   BILITOT 1.6 (H) 07/13/2019     Microbiology: Recent Results (from the past 240 hour(s))  Blood Culture (routine x 2)     Status: Abnormal (Preliminary result)   Collection Time: 07/13/19  1:10 AM   Specimen: BLOOD  Result Value Ref Range Status   Specimen Description BLOOD RIGHT ANTECUBITAL  Final   Special Requests   Final    BOTTLES DRAWN AEROBIC AND ANAEROBIC Blood Culture adequate volume   Culture  Setup Time   Final    GRAM POSITIVE COCCI IN CLUSTERS IN BOTH AEROBIC AND ANAEROBIC BOTTLES CRITICAL RESULT CALLED TO, READ BACK BY AND VERIFIED WITH: Salome Holmes [facility] [id number] 07/13/19 A BROWNING Performed at [facility], [place] [address] [place], [place] [address]    Culture STAPHYLOCOCCUS AUREUS (A)  Final   Report Status PENDING  Incomplete  Blood Culture (routine x 2)     Status: Abnormal (Preliminary result)   Collection Time: 07/13/19  1:14 AM    Specimen: BLOOD  Result Value Ref Range Status   Specimen Description BLOOD LEFT ANTECUBITAL  Final   Special Requests   Final    BOTTLES DRAWN AEROBIC AND ANAEROBIC Blood Culture adequate volume   Culture  Setup Time   Final    GRAM POSITIVE COCCI IN CLUSTERS IN BOTH AEROBIC AND ANAEROBIC BOTTLES CRITICAL RESULT CALLED TO, READ BACK BY AND VERIFIED WITH: Salome Holmes [facility] [id number] 07/13/19 A BROWNING Performed at [facility], [place] [address], [place], [place] [address]    Culture STAPHYLOCOCCUS AUREUS (A)  Final   Report Status PENDING  Incomplete  Blood Culture ID Panel (Reflexed)     Status: Abnormal   Collection Time: 07/13/19  1:14 AM  Result Value Ref Range Status   Enterococcus species NOT DETECTED NOT DETECTED Final   Listeria monocytogenes NOT DETECTED NOT DETECTED Final   Staphylococcus species DETECTED (A) NOT DETECTED Final    Comment: CRITICAL RESULT CALLED TO, READ BACK BY AND VERIFIED WITH: Salome Holmes PHARMD 1510 07/13/19 A BROWNING    Staphylococcus aureus (BCID) DETECTED (A) NOT DETECTED Final    Comment: Methicillin (oxacillin)-resistant Staphylococcus aureus (MRSA). MRSA is predictably resistant to beta-lactam antibiotics (except ceftaroline). Preferred therapy is vancomycin unless clinically contraindicated. Patient requires contact precautions if  hospitalized. CRITICAL RESULT CALLED TO, READ BACK BY AND VERIFIED WITH: Salome Holmes PHARMD [id number] 07/13/19 A BROWNING    Methicillin resistance DETECTED (A) NOT DETECTED Final    Comment: CRITICAL RESULT CALLED TO, READ BACK BY AND VERIFIED WITH: Salome Holmes PHARMD 1510 07/13/19 A BROWNING    Streptococcus species NOT DETECTED NOT DETECTED Final   Streptococcus agalactiae NOT DETECTED NOT DETECTED Final   Streptococcus pneumoniae NOT DETECTED NOT DETECTED Final   Streptococcus pyogenes NOT DETECTED NOT DETECTED Final   Acinetobacter baumannii NOT DETECTED NOT DETECTED Final   Enterobacteriaceae species NOT DETECTED NOT  DETECTED Final   Enterobacter cloacae complex NOT DETECTED NOT DETECTED Final  Escherichia coli NOT DETECTED NOT DETECTED Final   Klebsiella oxytoca NOT DETECTED NOT DETECTED Final   Klebsiella pneumoniae NOT DETECTED NOT DETECTED Final   Proteus species NOT DETECTED NOT DETECTED Final   Serratia marcescens NOT DETECTED NOT DETECTED Final   Haemophilus influenzae NOT DETECTED NOT DETECTED Final   Neisseria meningitidis NOT DETECTED NOT DETECTED Final   Pseudomonas aeruginosa NOT DETECTED NOT DETECTED Final   Candida albicans NOT DETECTED NOT DETECTED Final   Candida glabrata NOT DETECTED NOT DETECTED Final   Candida krusei NOT DETECTED NOT DETECTED Final   Candida parapsilosis NOT DETECTED NOT DETECTED Final   Candida tropicalis NOT DETECTED NOT DETECTED Final    Comment: Performed at [facility], [place] [address] [place], [place] [address]  SARS CORONAVIRUS 2 (TAT 6-24 HRS) Nasopharyngeal Nasopharyngeal Swab     Status: None   Collection Time: 07/13/19  2:57 AM   Specimen: Nasopharyngeal Swab  Result Value Ref Range Status   SARS Coronavirus 2 NEGATIVE NEGATIVE Final    Comment: (NOTE) SARS-CoV-2 target nucleic acids are NOT DETECTED. The SARS-CoV-2 RNA is generally detectable in upper and lower respiratory specimens during the acute phase of infection. Negative results do not preclude SARS-CoV-2 infection, do not rule out co-infections with other pathogens, and should not be used as the sole basis for treatment or other patient management decisions. Negative results must be combined with clinical observations, patient history, and epidemiological information. The expected result is Negative. Fact Sheet for Patients: SugarRoll.be Fact Sheet for Healthcare Providers: https://www.woods-mathews.com/ This test is not yet approved or cleared by the [place] FDA and  has been authorized for detection and/or diagnosis of SARS-CoV-2 by  FDA under an Emergency Use Authorization (EUA). This EUA will remain  in effect (meaning this test can be used) for the duration of the COVID-19 declaration under Section 56 4(b)(1) of the Act, 21 U.S.C. section 360bbb-3(b)(1), unless the authorization is terminated or revoked sooner. Performed at [facility], [place] [address] [place], [place] [address]   Respiratory Panel by RT PCR (Flu A&B, Covid) - Nasopharyngeal Swab     Status: None   Collection Time: 07/13/19  6:56 AM   Specimen: Nasopharyngeal Swab  Result Value Ref Range Status   SARS Coronavirus 2 by RT PCR NEGATIVE NEGATIVE Final    Comment: (NOTE) SARS-CoV-2 target nucleic acids are NOT DETECTED. The SARS-CoV-2 RNA is generally detectable in upper respiratoy specimens during the acute phase of infection. The lowest concentration of SARS-CoV-2 viral copies this assay can detect is 131 copies/mL. A negative result does not preclude SARS-Cov-2 infection and should not be used as the sole basis for treatment or other patient management decisions. A negative result may occur with  improper specimen collection/handling, submission of specimen other than nasopharyngeal swab, presence of viral mutation(s) within the areas targeted by this assay, and inadequate number of viral copies (<131 copies/mL). A negative result must be combined with clinical observations, patient history, and epidemiological information. The expected result is Negative. Fact Sheet for Patients:  PinkCheek.be Fact Sheet for Healthcare Providers:  GravelBags.it This test is not yet ap proved or cleared by the [place] FDA and  has been authorized for detection and/or diagnosis of SARS-CoV-2 by FDA under an Emergency Use Authorization (EUA). This EUA will remain  in effect (meaning this test can be used) for the duration of the COVID-19 declaration under Section 564(b)(1) of the Act, 21  U.S.C. section 360bbb-3(b)(1), unless the authorization  is terminated or revoked sooner.    Influenza A by PCR NEGATIVE NEGATIVE Final   Influenza B by PCR NEGATIVE NEGATIVE Final    Comment: (NOTE) The Xpert Xpress SARS-CoV-2/FLU/RSV assay is intended as an aid in  the diagnosis of influenza from Nasopharyngeal swab specimens and  should not be used as a sole basis for treatment. Nasal washings and  aspirates are unacceptable for Xpert Xpress SARS-CoV-2/FLU/RSV  testing. Fact Sheet for Patients: PinkCheek.be Fact Sheet for Healthcare Providers: GravelBags.it This test is not yet approved or cleared by the [place] FDA and  has been authorized for detection and/or diagnosis of SARS-CoV-2 by  FDA under an Emergency Use Authorization (EUA). This EUA will remain  in effect (meaning this test can be used) for the duration of the  Covid-19 declaration under Section 564(b)(1) of the Act, 21  U.S.C. section 360bbb-3(b)(1), unless the authorization is  terminated or revoked. Performed at [facility], [place] [address], [place], [place] [address]   Urine culture     Status: None   Collection Time: 07/13/19  8:18 AM   Specimen: In/Out Cath Urine  Result Value Ref Range Status   Specimen Description IN/OUT CATH URINE  Final   Special Requests NONE  Final   Culture   Final    NO GROWTH Performed at [facility], [place] [address], [place], [place] [address]    Report Status 07/14/2019 FINAL  Final  MRSA PCR Screening     Status: Abnormal   Collection Time: 07/13/19 10:59 AM   Specimen: Nasal Mucosa; Nasopharyngeal  Result Value Ref Range Status   MRSA by PCR POSITIVE (A) NEGATIVE Final    Comment:        The GeneXpert MRSA Assay (FDA approved for NASAL specimens only), is one component of a comprehensive MRSA colonization surveillance program. It is not intended to diagnose MRSA infection nor to guide or  monitor treatment for MRSA infections. RESULT CALLED TO, READ BACK BY AND VERIFIED WITH: B. Adonis Brook RN 13:15 07/13/19 ([email or web address]) Performed at [facility], [place] [address] [place], [place] [address]   Body fluid culture     Status: None (Preliminary result)   Collection Time: 07/13/19  1:29 PM   Specimen: Synovium; Body Fluid  Result Value Ref Range Status   Specimen Description SYNOVIAL KNEE LEFT  Final   Special Requests NONE  Final   Gram Stain   Final    ABUNDANT WBC PRESENT, PREDOMINANTLY MONONUCLEAR ABUNDANT GRAM POSITIVE COCCI Performed at [facility], [place] [address], [place], [place] [address]    Culture ABUNDANT STAPHYLOCOCCUS AUREUS  Final   Report Status PENDING  Incomplete  Group A Strep by PCR     Status: None   Collection Time: 07/13/19  4:08 PM   Specimen: Throat; Sterile Swab  Result Value Ref Range Status   Group A Strep by PCR NOT DETECTED NOT DETECTED Final    Comment: Performed at [facility], [place] [address], [place],  [address]     Terri Piedra, [place] for [facility] [phone] Pager  07/14/2019  10:15 AM

## 2019-07-14 NOTE — Progress Notes (Signed)
Patient combative and pulling at BiPAP mask. Placed on [facility] at this time, and he seems to be more comfortable. RT to monitor

## 2019-07-14 NOTE — Progress Notes (Addendum)
Clinical/Bedside Swallow Evaluation Patient Details  Name: SCHAFER STUDER MRN: [id number] Date of Birth: [birth year]  Today's Date: 07/14/2019 Time: [id number] SLP Time Calculation (min) (ACUTE ONLY): 10 min    Past Medical History:  Past Medical History:  Diagnosis Date  . Anxiety    takes Xanax daily  . Arthritis   . Chronic back pain    DDD  . Complication of anesthesia    agitated when awaking   . H/O rotator cuff tear   . History of kidney stones   . Hypertension    takes Toprol,Prinizide,and Amlodipine daily  . Joint pain   . Joint swelling   . Laceration    history of to right lower extermity   . Night muscle spasms    takes Soma daily  . Numbness and tingling    hands and feet   . Pneumonia    history of   . Tinnitus    Past Surgical History:  Past Surgical History:  Procedure Laterality Date  . HERNIA REPAIR Bilateral as a baby   inguinal-  . LITHOTRIPSY    . SHOULDER ARTHROSCOPY WITH SUBACROMIAL DECOMPRESSION AND OPEN ROTATOR C Left 01/16/2014   Procedure: LEFT SHOULDER ARTHROSCOPY WITH SUBACROMIAL DECOMPRESSION AND MINI OPEN ROTATOR CUFF REPAIR, OPEN DISTAL CLAVICLE RESECTION AND TENDODESIS;  Surgeon: Augustin Schooling, MD;  Location: [facility];  Service: Orthopedics;  Laterality: Left;  . tens unit     for severe pain   . TOTAL KNEE ARTHROPLASTY Left 06/19/2015   Procedure: LEFT TOTAL KNEE ARTHROPLASTY;  Surgeon: Paralee Cancel, MD;  Location: [facility];  Service: Orthopedics;  Laterality: Left;   HPI:  58 year old man who is critically ill due to a severe agitated delirium due to toxic metabolic encephalopathy requiring titration of Precedex infusion to maintain patient safety. MRSA bacteremia secondary to septic right joint now status post washout. Overnight pt had issues with agitation and was on BiPAP.     Assessment / Plan / Recommendation Clinical Impression  Pt remains at increased risk of aspiration d/t decreased mentation. Pt was able to sustained  arousal to cup of thin liquids for 1 to 2 minutes with Max verbal and physical cues. Wrist restraints were removed to allow pt to hold cup. Despite this, pt required Max physical assistance to bring cup to mouth. Pt was not able to draw liquids thru straw. He was able to perceive cup at lips but he didn't demonstrate any cognitive awarness of liquid in his mouth. As a result, all of the liquid spilled anteriorally from his mouth. Nursing present during session and education provided for pt to remain NPO until pt is able to tolerate decreased sedation and demonstrate improvement mentation. ST to follow.    HPI HPI: 58 year old man who is critically ill due to a severe agitated delirium due to toxic metabolic encephalopathy requiring titration of Precedex infusion to maintain patient safety. MRSA bacteremia secondary to septic right joint now status post washout. Overnight pt had issues with agitation and was on BiPAP.       SLP Plan   TBD       Recommendations  NPO; Medication Administration: Via alternative means                         Oral Care Recommendations: Oral care QID Follow up Recommendations: (TBD) SLP Visit Diagnosis: Dysphagia, unspecified (R13.10)       GO  Palmina Clodfelter 07/14/2019, 3:17 PM

## 2019-07-14 NOTE — Progress Notes (Signed)
ANTICOAGULATION CONSULT NOTE  Pharmacy Consult for heparin Indication: atrial fibrillation  No Known Allergies  Patient Measurements: Height: 6\' 3"  (190.5 cm) Weight: 269 lb 13.5 oz (122.4 kg) IBW/kg (Calculated) : 84.5 Heparin Dosing Weight: 109.3 kg  Vital Signs: Temp: 98.7 F (37.1 C) (12/17 1505) Temp Source: Axillary (12/17 1505) BP: 92/65 (12/17 1600) Pulse Rate: 76 (12/17 1600)  Labs: Recent Labs    07/12/19 1910 07/13/19 0117 07/13/19 0431 07/13/19 1245 07/13/19 1635 07/13/19 2357 07/14/19 0600 07/14/19 1543  HGB 13.3  --  11.5*  --   --  11.6* 11.1*  --   HCT 40.3  --  34.8*  --   --  34.0* 32.5*  --   PLT 227  --  164  --   --   --  162  --   APTT  --  30  --   --   --   --   --   --   LABPROT  --  17.9*  --   --   --   --   --   --   INR  --  1.5*  --   --   --   --   --   --   HEPARINUNFRC  --   --   --  0.81* <0.10*  --   --  <0.10*  CREATININE 3.36*  --  3.08*  --   --   --  2.42*  --   CKTOTAL  --   --   --   --   --   --  126  --     Estimated Creatinine Clearance: 46.9 mL/min (A) (by C-G formula based on SCr of 2.42 mg/dL (H)).  Assessment: 58 y.o. male with a history of atrial fibrillation who presented 07/13/19 with L knee pain and swelling and is now status post irrigation and debridement 12/16 PM. Pharmacy was consulted for IV Heparin dosing. Heparin was held for procedure and resumed this AM. Note patient was not taking Eliquis prior to admission due to cost.   Initial heparin level after restart is low at <0.10 on current rate of 1750 units/hr. Per Lexi, RN, no bleeding noted and no issues with infusion.   Goal of Therapy:  Heparin level 0.3-0.7 units/ml Monitor platelets by anticoagulation protocol: Yes   Plan:  Increase IV Heparin to 2000 units/hr.  Recheck Heparin level in 6 hours.   Sloan Leiter, PharmD, BCPS, BCCCP Clinical Pharmacist Please refer to [facility] for [place] numbers 07/14/2019,4:59 PM

## 2019-07-14 NOTE — Progress Notes (Signed)
Progress Note  Patient Name: Vernon Frye Date of Encounter: 07/14/2019  Primary Cardiologist: Belva Crome III, MD   Subjective   Left total knee replacement with pain started empirically on antibiotics for potential septic joint.  A. fib with RVR noted yesterday, now rate controlled. Sleeping, sedate in bed after agitation earlier.   Inpatient Medications    Scheduled Meds: . Chlorhexidine Gluconate Cloth  6 each Topical Daily  . docusate sodium  100 mg Oral BID  . folic acid  1 mg Oral Daily  . mouth rinse  15 mL Mouth Rinse BID  . multivitamin with minerals  1 tablet Oral Daily  . thiamine  100 mg Oral Daily   Or  . thiamine  100 mg Intravenous Daily   Continuous Infusions: . sodium chloride 50 mL/hr at 07/14/19 1016  . DAPTOmycin (CUBICIN)  IV    . dexmedetomidine (PRECEDEX) IV infusion 0.7 mcg/kg/hr (07/14/19 1000)  . heparin 1,750 Units/hr (07/14/19 1000)   PRN Meds: acetaminophen **OR** acetaminophen, diphenhydrAMINE, HYDROcodone-acetaminophen, HYDROcodone-acetaminophen, LORazepam **OR** LORazepam, menthol-cetylpyridinium **OR** phenol, metoCLOPramide **OR** metoCLOPramide (REGLAN) injection, morphine injection, ondansetron **OR** ondansetron (ZOFRAN) IV, sodium chloride flush   Vital Signs    Vitals:   07/14/19 0945 07/14/19 1000 07/14/19 1015 07/14/19 1101  BP: (!) 82/60 (!) 80/56 (!) 82/54   Pulse: 84 78 83   Resp: (!) 23 (!) 26 (!) 25   Temp:    98.5 F (36.9 C)  TempSrc:    Axillary  SpO2: 97% 100% 100%   Weight:      Height:        Intake/Output Summary (Last 24 hours) at 07/14/2019 1115 Last data filed at 07/14/2019 1000 Gross per 24 hour  Intake 2700.91 ml  Output 1610 ml  Net 1090.91 ml   Last 3 Weights 07/14/2019 07/13/2019 09/16/2018  Weight (lbs) 269 lb 13.5 oz 260 lb 2.3 oz 260 lb  Weight (kg) 122.4 kg 118 kg 117.935 kg      Telemetry    Atrial fibrillation heart rate currently in the 70s to 80s well rate controlled.- Personally  Reviewed  ECG    A. fib with RVR- Personally Reviewed  Physical Exam   GEN: No acute distress.   Neck: No JVD Cardiac: irreg, no murmurs, rubs, or gallops.  Respiratory: Clear to auscultation bilaterally. GI: Soft, nontender, non-distended  MS: Marked LE edema; No deformity. [place] Neuro:  Nonfocal  Psych: sedate  Labs    High Sensitivity Troponin:  No results for input(s): TROPONINIHS in the last 720 hours.    Chemistry Recent Labs  Lab 07/12/19 1910 07/13/19 0431 07/13/19 2357 07/14/19 0600  NA 130* 130* 130* 132*  K 3.6 3.8 3.9 4.4  CL 91* 95*  --  98  CO2 22 21*  --  19*  GLUCOSE 122* 132*  --  174*  BUN 43* 44*  --  55*  CREATININE 3.36* 3.08*  --  2.42*  CALCIUM 8.7* 8.1*  --  7.6*  PROT 6.9 5.3*  --   --   ALBUMIN 2.5* 1.9*  --   --   AST 29 24  --   --   ALT 17 14  --   --   ALKPHOS 94 68  --   --   BILITOT 1.9* 1.6*  --   --   GFRNONAA 19* 21*  --  28*  GFRAA 22* 25*  --  33*  ANIONGAP 17* 14  --  15  Hematology Recent Labs  Lab 07/12/19 1910 07/13/19 0431 07/13/19 2357 07/14/19 0600  WBC 16.0* 12.9*  --  16.4*  RBC 4.55 3.95*  --  3.76*  HGB 13.3 11.5* 11.6* 11.1*  HCT 40.3 34.8* 34.0* 32.5*  MCV 88.6 88.1  --  86.4  MCH 29.2 29.1  --  29.5  MCHC 33.0 33.0  --  34.2  RDW 14.0 14.1  --  14.5  PLT 227 164  --  162    BNP Recent Labs  Lab 07/13/19 0431  BNP 299.7*     DDimer  Recent Labs  Lab 07/13/19 1608  DDIMER 3.78*     Radiology    US Renal  Result Date: 07/13/2019 CLINICAL DATA:  Acute renal injury EXAM: RENAL / URINARY TRACT ULTRASOUND COMPLETE COMPARISON:  None. FINDINGS: Right Kidney: Renal measurements: 14.7 x 6.1 x 6.6 cm. = volume: 310 mL . Echogenicity within normal limits. No mass or hydronephrosis visualized. Left Kidney: Renal measurements: 15.0 x 7.2 x 6.9 cm = volume: 385 mL. Mild hydronephrosis is noted with a prominent extrarenal pelvis. Bladder: Appears normal for degree of bladder distention. Other:  None. IMPRESSION: Mild left-sided hydronephrosis is noted. Electronically Signed   By: Inez Catalina M.D.   On: 07/13/2019 10:40   DG CHEST PORT 1 VIEW  Result Date: 07/13/2019 CLINICAL DATA:  Shortness of breath EXAM: PORTABLE CHEST 1 VIEW COMPARISON:  07/13/2019 FINDINGS: The heart size is enlarged. There are scattered airspace opacities primarily at the lung bases which are new from prior study. There is a small left-sided pleural effusion. There is no pneumothorax. No acute osseous abnormality. IMPRESSION: 1. Low lung volumes with probable postoperative atelectasis at the lung bases. 2. Probable small left-sided pleural effusion. 3. Cardiomegaly. Electronically Signed   By: Constance Holster M.D.   On: 07/13/2019 23:52   DG Chest Port 1 View  Result Date: 07/13/2019 CLINICAL DATA:  Atrial fibrillation, hypoxia EXAM: PORTABLE CHEST 1 VIEW COMPARISON:  09/16/2018 FINDINGS: Lungs are clear. No pleural effusion or pneumothorax. Top-normal heart size. IMPRESSION: No acute process in the chest. Electronically Signed   By: Macy Mis M.D.   On: 07/13/2019 07:29   DG Knee Complete 4 Views Left  Result Date: 07/12/2019 CLINICAL DATA:  Knee swelling and redness EXAM: LEFT KNEE - COMPLETE 4+ VIEW COMPARISON:  06/29/2015 FINDINGS: Prior left knee replacement, stable. Moderate joint effusion. No hardware complicating feature. No fracture, subluxation or dislocation. IMPRESSION: Prior left knee replacement. Moderate joint effusion. No acute bony abnormality. Electronically Signed   By: Rolm Baptise M.D.   On: 07/12/2019 19:30   ECHOCARDIOGRAM COMPLETE  Result Date: 07/13/2019   ECHOCARDIOGRAM REPORT   Patient Name:   Vernon Frye Date of Exam: 07/13/2019 Medical Rec #:  [id number]       Height:       75.0 in Accession #:    [id number]      Weight:       260.0 lb Date of Birth:  09/01/1960      BSA:          2.45 m Patient Age:    58 years        BP:           101/68 mmHg Patient Gender: M                HR:           124 bpm. Exam Location:  Inpatient Procedure: 2D Echo STAT ECHO  Indications:    Atrial Fibrillation 427.31/ I48.91  History:        Patient has no prior history of Echocardiogram examinations.                 Risk Factors:Hypertension. Left knee sepitc joint, acute kidney                 injury, allergic reaction to antibiotic, COVID-19 test pending,.  Sonographer:    Darlina Sicilian RDCS Referring Phys: [phone] ANAND D HONGALGI  Sonographer Comments: Patient condition.  LEFT VENTRICLE PLAX 2D LVIDd:         4.20 cm LVIDs:         2.90 cm LV PW:         1.50 cm LV IVS:        1.40 cm LVOT diam:     1.90 cm LV SV:         46 ml LV SV Index:   18.34 LVOT Area:     2.84 cm  LEFT ATRIUM            Index LA diam:      4.00 cm  1.63 cm/m LA Vol (A4C): 104.0 ml 42.39 ml/m   AORTA Ao Root diam: 2.70 cm MITRAL VALVE MV Area (PHT): 5.34 cm            SHUNTS MV PHT:        41.18 msec          Systemic Diam: 1.90 cm MV Decel Time: 142 msec MV E velocity: 88.83 cm/s 103 cm/s  Electronically signed by Signature Date/Time: /    Preliminary    VAS [place] LOWER EXTREMITY VENOUS (DVT)  Result Date: 07/13/2019  Lower Venous Study Indications: Edema.  Comparison Study: no prior Performing Technologist: Abram Sander RVS  Examination Guidelines: A complete evaluation includes B-mode imaging, spectral Doppler, color Doppler, and power Doppler as needed of all accessible portions of each vessel. Bilateral testing is considered an integral part of a complete examination. Limited examinations for reoccurring indications may be performed as noted.  +---------+---------------+---------+-----------+----------+--------------+ RIGHT    CompressibilityPhasicitySpontaneityPropertiesThrombus Aging +---------+---------------+---------+-----------+----------+--------------+ CFV      Full           Yes      Yes                                 +---------+---------------+---------+-----------+----------+--------------+  SFJ      Full                                                        +---------+---------------+---------+-----------+----------+--------------+ FV Prox  Full                                                        +---------+---------------+---------+-----------+----------+--------------+ FV Mid   Full                                                        +---------+---------------+---------+-----------+----------+--------------+  FV DistalFull                                                        +---------+---------------+---------+-----------+----------+--------------+ PFV      Full                                                        +---------+---------------+---------+-----------+----------+--------------+ POP      Full           Yes      Yes                                 +---------+---------------+---------+-----------+----------+--------------+ PTV      Full                                                        +---------+---------------+---------+-----------+----------+--------------+ PERO     Full                                                        +---------+---------------+---------+-----------+----------+--------------+   +---------+---------------+---------+-----------+----------+--------------+ LEFT     CompressibilityPhasicitySpontaneityPropertiesThrombus Aging +---------+---------------+---------+-----------+----------+--------------+ CFV      Full           Yes      Yes                                 +---------+---------------+---------+-----------+----------+--------------+ SFJ      Full                                                        +---------+---------------+---------+-----------+----------+--------------+ FV Prox  Full                                                        +---------+---------------+---------+-----------+----------+--------------+ FV Mid   Full                                                         +---------+---------------+---------+-----------+----------+--------------+ FV DistalFull                                                        +---------+---------------+---------+-----------+----------+--------------+  PFV      Full                                                        +---------+---------------+---------+-----------+----------+--------------+ POP      Full           Yes      Yes                                 +---------+---------------+---------+-----------+----------+--------------+ PTV      Full                                                        +---------+---------------+---------+-----------+----------+--------------+ PERO                                                  Not visualized +---------+---------------+---------+-----------+----------+--------------+     Summary: Right: There is no evidence of deep vein thrombosis in the lower extremity. No cystic structure found in the popliteal fossa. Left: There is no evidence of deep vein thrombosis in the lower extremity. No cystic structure found in the popliteal fossa.  *See table(s) above for measurements and observations. Electronically signed by Monica Martinez MD on 07/13/2019 at 12:52:52 PM.    Final     Cardiac Studies   Echocardiogram 07/13/2019-hyperdynamic EF 65% to 70% with moderately dilated left atrium  Patient Profile     58 y.o. male with A. fib RVR in the setting of potential septic left knee joint post replacement  Assessment & Plan    Atrial fibrillation with rapid ventricular response -Blood pressure fairly soft this morning -Amiodarone drip has been discontinued, heart rate currently 70-80 with AFIB. -On heparin IV -Previously noncompliant with Eliquis  Sepsis -Supporting blood pressure, fluids, pressors as needed potentially preferentially phenylephrine if necessary given no beta agonist given recent A. fib with RVR.          For questions or updates, please contact [place] Please consult [email or web address] for contact info under        Signed, Candee Furbish, MD  07/14/2019, 11:15 AM

## 2019-07-15 LAB — CBC
HCT: 32.2 % — ABNORMAL LOW (ref 39.0–52.0)
Hemoglobin: 10.8 g/dL — ABNORMAL LOW (ref 13.0–17.0)
MCH: 29.3 pg (ref 26.0–34.0)
MCHC: 33.5 g/dL (ref 30.0–36.0)
MCV: 87.5 fL (ref 80.0–100.0)
Platelets: 197 10*3/uL (ref 150–400)
RBC: 3.68 MIL/uL — ABNORMAL LOW (ref 4.22–5.81)
RDW: 14.7 % (ref 11.5–15.5)
WBC: 19.9 10*3/uL — ABNORMAL HIGH (ref 4.0–10.5)
nRBC: 0 % (ref 0.0–0.2)

## 2019-07-15 LAB — CULTURE, BLOOD (ROUTINE X 2)
Special Requests: ADEQUATE
Special Requests: ADEQUATE

## 2019-07-15 LAB — BODY FLUID CULTURE

## 2019-07-15 LAB — HEPARIN LEVEL (UNFRACTIONATED)
Heparin Unfractionated: <0.1 IU/mL — ABNORMAL LOW (ref 0.30–0.70)
Heparin Unfractionated: <0.1 IU/mL — ABNORMAL LOW (ref 0.30–0.70)
Heparin Unfractionated: <0.1 IU/mL — ABNORMAL LOW (ref 0.30–0.70)

## 2019-07-15 LAB — ANTITHROMBIN III: AntiThromb III Func: 41 % — ABNORMAL LOW (ref 75–120)

## 2019-07-15 MED ORDER — HEPARIN BOLUS VIA INFUSION
3000.0000 [IU] | Freq: Once | INTRAVENOUS | Status: AC
  Administered 2019-07-15: 3000 [IU] via INTRAVENOUS
  Filled 2019-07-15: qty 3000

## 2019-07-15 NOTE — Progress Notes (Signed)
ANTICOAGULATION CONSULT NOTE  Pharmacy Consult for heparin Indication: atrial fibrillation  No Known Allergies  Patient Measurements: Height: 6\' 3"  (190.5 cm) Weight: 269 lb 13.5 oz (122.4 kg) IBW/kg (Calculated) : 84.5 Heparin Dosing Weight: 109.3 kg  Vital Signs: Temp: 97.6 F (36.4 C) (12/17 2345) Temp Source: Oral (12/17 2345) BP: 115/79 (12/18 0100) Pulse Rate: 89 (12/18 0100)  Labs: Recent Labs    07/12/19 1910 07/12/19 1910 07/13/19 0117 07/13/19 0431 07/13/19 1635 07/13/19 2357 07/14/19 0600 07/14/19 1543 07/14/19 2357  HGB 13.3  --   --  11.5*  --  11.6* 11.1*  --  10.8*  HCT 40.3  --   --  34.8*  --  34.0* 32.5*  --  32.2*  PLT 227  --   --  164  --   --  162  --  197  APTT  --   --  30  --   --   --   --   --   --   LABPROT  --   --  17.9*  --   --   --   --   --   --   INR  --   --  1.5*  --   --   --   --   --   --   HEPARINUNFRC  --    < >  --   --  <0.10*  --   --  <0.10* <0.10*  CREATININE 3.36*  --   --  3.08*  --   --  2.42*  --   --   CKTOTAL  --   --   --   --   --   --  126  --   --    < > = values in this interval not displayed.    Estimated Creatinine Clearance: 46.9 mL/min (A) (by C-G formula based on SCr of 2.42 mg/dL (H)).  Assessment: 57 y.o. male with h/o Afib s/p I & D for heparin   Goal of Therapy:  Heparin level 0.3-0.7 units/ml Monitor platelets by anticoagulation protocol: Yes   Plan:  Increase Heparin 2400 units/hr Check heparin level in 8 hours.   Phillis Knack, PharmD, BCPS  07/15/2019,1:45 AM

## 2019-07-15 NOTE — Progress Notes (Signed)
  Speech Language Pathology Treatment: Dysphagia  Patient Details Name: Vernon Frye MRN: [id number] DOB: 04-Sep-1960 Today's Date: 07/15/2019 Time: [id number] SLP Time Calculation (min) (ACUTE ONLY): 20 min  Assessment / Plan / Recommendation Clinical Impression  Pt seen for follow up after BSE completed 07/14/2019. Pt continues to be sleepy, but was arousable and safe for limited po trials today. Pt was uncooperative with oral care, allowing only his lips to be cleaned. He refused to open his mouth for cleaning of the oral cavity. Following limited oral care, pt accepted individual ice chips and sips of water via cup. Pt continues to be unable to use the straw, but he exhibited better oral control today. There was no anterior leakage noted. Pt exhibited a delayed cough after trials of thin liquid. Pt is making gradual improvement, but continues to be unsafe for po diet due to altered mentation. When alert, pt may have individual ice chips after oral care. Pt is currently not supported nutritionally, so SLP will continue to follow at bedside to determine readiness for po intake.   HPI HPI: 58 year old man who is critically ill due to a severe agitated delirium due to toxic metabolic encephalopathy requiring titration of Precedex infusion to maintain patient safety. MRSA bacteremia secondary to septic right joint now status post washout. Overnight pt had issues with agitation and was on BiPAP.       SLP Plan  Continue with current plan of care       Recommendations  Diet recommendations: NPO Medication Administration: Via alternative means                Oral Care Recommendations: Oral care QID Follow up Recommendations: (TBD) SLP Visit Diagnosis: Dysphagia, unspecified (R13.10) Plan: Continue with current plan of care       [place], [facility], [place] Speech Language Pathologist Office: [phone] Pager: [phone]   Shonna Chock 07/15/2019,  10:51 AM

## 2019-07-15 NOTE — Progress Notes (Signed)
[facility] for Infectious Disease  Date of Admission:  07/12/2019     Total days of antibiotics 4         ASSESSMENT:  Vernon Frye has remained afebrile with mild increase in leukocytosis of 19.9 with continue treatment for MRSA bacteremia and prosthetic joint infection status post irrigation and debridement with polyexchange.  He is slightly more awake today.  Repeat blood cultures have been without growth to date.  Nursing removing midline for line holiday.  TTE results remaining pending and will likely need TEE.  CK level stable at 126.  Continue current dose of daptomycin.  Renal function improved today.  PLAN:  1. Continue daptomycin. 2. Therapeutic drug monitoring of CK levels while on daptomycin. 3. Monitor repeat cultures for clearance of bacteremia. 4. Awaiting TTE results and will likely need TEE when able.  Principal Problem:   MRSA bacteremia Active Problems:   Septic joint ([place])   Prosthetic joint infection ([place])   Atrial fibrillation with rapid ventricular response (HCC)   AKI (acute kidney injury) ([place])   Hyponatremia   Severe sepsis (HCC)   Severe sepsis with acute organ dysfunction (HCC)   Respiratory insufficiency   Acute pain of left knee   Tachypnea   . Chlorhexidine Gluconate Cloth  6 each Topical Daily  . docusate sodium  100 mg Oral BID  . folic acid  1 mg Oral Daily  . multivitamin with minerals  1 tablet Oral Daily  . thiamine  100 mg Oral Daily   Or  . thiamine  100 mg Intravenous Daily    SUBJECTIVE:  Afebrile overnight with no acute events.  [name] is conversive but confused.  Denies pain, fevers, or chills at present.   No Known Allergies   Review of Systems: Review of Systems  Constitutional: Negative for chills, fever and weight loss.  Respiratory: Negative for cough, shortness of breath and wheezing.   Cardiovascular: Negative for chest pain.  Gastrointestinal: Negative for abdominal pain, constipation, diarrhea, nausea  and vomiting.  Skin: Negative for rash.    OBJECTIVE: Vitals:   07/15/19 0700 07/15/19 0800 07/15/19 0900 07/15/19 1000  BP: 117/81 121/89 125/89 (!) 130/92  Pulse: 85 88 86 91  Resp: (!) 24 (!) 30 (!) 29 (!) 32  Temp: 99.8 F (37.7 C)     TempSrc: Axillary     SpO2: 99% 99% 99% 99%  Weight:      Height:       Body mass index is 33.31 kg/m.  Physical Exam Constitutional:      General: He is not in acute distress.    Appearance: He is well-developed.     Interventions: He is restrained.     Comments: Lying in bed with head of bed elevated; confused  Cardiovascular:     Rate and Rhythm: Normal rate and regular rhythm.     Heart sounds: Normal heart sounds.  Pulmonary:     Effort: Pulmonary effort is normal.     Breath sounds: Normal breath sounds.  Musculoskeletal:     Comments: Surgical dressing remains in place with no evidence of drainage.  Significant edema in left lower extremity at 1-2+ pitting.  Distal pulses and sensation intact.  Skin:    General: Skin is warm and dry.  Neurological:     Mental Status: He is alert.     Lab Results Lab Results  Component Value Date   WBC 19.9 (H) 07/14/2019   HGB 10.8 (L) 07/14/2019  HCT 32.2 (L) 07/14/2019   MCV 87.5 07/14/2019   PLT 197 07/14/2019    Lab Results  Component Value Date   CREATININE 2.42 (H) 07/14/2019   BUN 55 (H) 07/14/2019   NA 132 (L) 07/14/2019   K 4.4 07/14/2019   CL 98 07/14/2019   CO2 19 (L) 07/14/2019    Lab Results  Component Value Date   ALT 14 07/13/2019   AST 24 07/13/2019   ALKPHOS 68 07/13/2019   BILITOT 1.6 (H) 07/13/2019     Microbiology: Recent Results (from the past 240 hour(s))  Blood Culture (routine x 2)     Status: Abnormal   Collection Time: 07/13/19  1:10 AM   Specimen: BLOOD  Result Value Ref Range Status   Specimen Description BLOOD RIGHT ANTECUBITAL  Final   Special Requests   Final    BOTTLES DRAWN AEROBIC AND ANAEROBIC Blood Culture adequate volume    Culture  Setup Time   Final    GRAM POSITIVE COCCI IN CLUSTERS IN BOTH AEROBIC AND ANAEROBIC BOTTLES CRITICAL RESULT CALLED TO, READ BACK BY AND VERIFIED WITH: Salome Holmes PHARMD 1510 07/13/19 A BROWNING    Culture (A)  Final    STAPHYLOCOCCUS AUREUS SUSCEPTIBILITIES PERFORMED ON PREVIOUS CULTURE WITHIN THE LAST 5 DAYS. Performed at [facility], [place] [address] [place], [place] [address]    Report Status 07/15/2019 FINAL  Final  Blood Culture (routine x 2)     Status: Abnormal   Collection Time: 07/13/19  1:14 AM   Specimen: BLOOD  Result Value Ref Range Status   Specimen Description BLOOD LEFT ANTECUBITAL  Final   Special Requests   Final    BOTTLES DRAWN AEROBIC AND ANAEROBIC Blood Culture adequate volume   Culture  Setup Time   Final    GRAM POSITIVE COCCI IN CLUSTERS IN BOTH AEROBIC AND ANAEROBIC BOTTLES CRITICAL RESULT CALLED TO, READ BACK BY AND VERIFIED WITH: Salome Holmes [facility] [id number] 07/13/19 A BROWNING Performed at [facility], [place] [address] [place],  [address]    Culture METHICILLIN RESISTANT STAPHYLOCOCCUS AUREUS (A)  Final   Report Status 07/15/2019 FINAL  Final   Organism ID, Bacteria METHICILLIN RESISTANT STAPHYLOCOCCUS AUREUS  Final      Susceptibility   Methicillin resistant staphylococcus aureus - MIC*    CIPROFLOXACIN >=8 RESISTANT Resistant     ERYTHROMYCIN >=8 RESISTANT Resistant     GENTAMICIN <=0.5 SENSITIVE Sensitive     OXACILLIN >=4 RESISTANT Resistant     TETRACYCLINE <=1 SENSITIVE Sensitive     VANCOMYCIN <=0.5 SENSITIVE Sensitive     TRIMETH/SULFA <=10 SENSITIVE Sensitive     CLINDAMYCIN <=0.25 SENSITIVE Sensitive     RIFAMPIN <=0.5 SENSITIVE Sensitive     Inducible Clindamycin NEGATIVE Sensitive     * METHICILLIN RESISTANT STAPHYLOCOCCUS AUREUS  Blood Culture ID Panel (Reflexed)     Status: Abnormal   Collection Time: 07/13/19  1:14 AM  Result Value Ref Range Status   Enterococcus species NOT DETECTED NOT DETECTED Final    Listeria monocytogenes NOT DETECTED NOT DETECTED Final   Staphylococcus species DETECTED (A) NOT DETECTED Final    Comment: CRITICAL RESULT CALLED TO, READ BACK BY AND VERIFIED WITH: Salome Holmes PHARMD 1510 07/13/19 A BROWNING    Staphylococcus aureus (BCID) DETECTED (A) NOT DETECTED Final    Comment: Methicillin (oxacillin)-resistant Staphylococcus aureus (MRSA). MRSA is predictably resistant to beta-lactam antibiotics (except ceftaroline). Preferred therapy is vancomycin unless clinically contraindicated. Patient requires contact precautions  if  hospitalized. CRITICAL RESULT CALLED TO, READ BACK BY AND VERIFIED WITH: Salome Holmes PHARMD [id number] 07/13/19 A BROWNING    Methicillin resistance DETECTED (A) NOT DETECTED Final    Comment: CRITICAL RESULT CALLED TO, READ BACK BY AND VERIFIED WITH: Salome Holmes PHARMD 1510 07/13/19 A BROWNING    Streptococcus species NOT DETECTED NOT DETECTED Final   Streptococcus agalactiae NOT DETECTED NOT DETECTED Final   Streptococcus pneumoniae NOT DETECTED NOT DETECTED Final   Streptococcus pyogenes NOT DETECTED NOT DETECTED Final   Acinetobacter baumannii NOT DETECTED NOT DETECTED Final   Enterobacteriaceae species NOT DETECTED NOT DETECTED Final   Enterobacter cloacae complex NOT DETECTED NOT DETECTED Final   Escherichia coli NOT DETECTED NOT DETECTED Final   Klebsiella oxytoca NOT DETECTED NOT DETECTED Final   Klebsiella pneumoniae NOT DETECTED NOT DETECTED Final   Proteus species NOT DETECTED NOT DETECTED Final   Serratia marcescens NOT DETECTED NOT DETECTED Final   Haemophilus influenzae NOT DETECTED NOT DETECTED Final   Neisseria meningitidis NOT DETECTED NOT DETECTED Final   Pseudomonas aeruginosa NOT DETECTED NOT DETECTED Final   Candida albicans NOT DETECTED NOT DETECTED Final   Candida glabrata NOT DETECTED NOT DETECTED Final   Candida krusei NOT DETECTED NOT DETECTED Final   Candida parapsilosis NOT DETECTED NOT DETECTED Final   Candida tropicalis  NOT DETECTED NOT DETECTED Final    Comment: Performed at [facility], [place]. [address] [place], [place] [address]  SARS CORONAVIRUS 2 (TAT 6-24 HRS) Nasopharyngeal Nasopharyngeal Swab     Status: None   Collection Time: 07/13/19  2:57 AM   Specimen: Nasopharyngeal Swab  Result Value Ref Range Status   SARS Coronavirus 2 NEGATIVE NEGATIVE Final    Comment: (NOTE) SARS-CoV-2 target nucleic acids are NOT DETECTED. The SARS-CoV-2 RNA is generally detectable in upper and lower respiratory specimens during the acute phase of infection. Negative results do not preclude SARS-CoV-2 infection, do not rule out co-infections with other pathogens, and should not be used as the sole basis for treatment or other patient management decisions. Negative results must be combined with clinical observations, patient history, and epidemiological information. The expected result is Negative. Fact Sheet for Patients: SugarRoll.be Fact Sheet for Healthcare Providers: https://www.woods-mathews.com/ This test is not yet approved or cleared by the [place] FDA and  has been authorized for detection and/or diagnosis of SARS-CoV-2 by FDA under an Emergency Use Authorization (EUA). This EUA will remain  in effect (meaning this test can be used) for the duration of the COVID-19 declaration under Section 56 4(b)(1) of the Act, 21 U.S.C. section 360bbb-3(b)(1), unless the authorization is terminated or revoked sooner. Performed at [facility], [place] [address], [place], [place] [address]   Respiratory Panel by RT PCR (Flu A&B, Covid) - Nasopharyngeal Swab     Status: None   Collection Time: 07/13/19  6:56 AM   Specimen: Nasopharyngeal Swab  Result Value Ref Range Status   SARS Coronavirus 2 by RT PCR NEGATIVE NEGATIVE Final    Comment: (NOTE) SARS-CoV-2 target nucleic acids are NOT DETECTED. The SARS-CoV-2 RNA is generally detectable in upper  respiratoy specimens during the acute phase of infection. The lowest concentration of SARS-CoV-2 viral copies this assay can detect is 131 copies/mL. A negative result does not preclude SARS-Cov-2 infection and should not be used as the sole basis for treatment or other patient management decisions. A negative result may occur with  improper specimen collection/handling, submission of specimen other than nasopharyngeal  swab, presence of viral mutation(s) within the areas targeted by this assay, and inadequate number of viral copies (<131 copies/mL). A negative result must be combined with clinical observations, patient history, and epidemiological information. The expected result is Negative. Fact Sheet for Patients:  PinkCheek.be Fact Sheet for Healthcare Providers:  GravelBags.it This test is not yet ap proved or cleared by the [place] FDA and  has been authorized for detection and/or diagnosis of SARS-CoV-2 by FDA under an Emergency Use Authorization (EUA). This EUA will remain  in effect (meaning this test can be used) for the duration of the COVID-19 declaration under Section 564(b)(1) of the Act, 21 U.S.C. section 360bbb-3(b)(1), unless the authorization is terminated or revoked sooner.    Influenza A by PCR NEGATIVE NEGATIVE Final   Influenza B by PCR NEGATIVE NEGATIVE Final    Comment: (NOTE) The Xpert Xpress SARS-CoV-2/FLU/RSV assay is intended as an aid in  the diagnosis of influenza from Nasopharyngeal swab specimens and  should not be used as a sole basis for treatment. Nasal washings and  aspirates are unacceptable for Xpert Xpress SARS-CoV-2/FLU/RSV  testing. Fact Sheet for Patients: PinkCheek.be Fact Sheet for Healthcare Providers: GravelBags.it This test is not yet approved or cleared by the [place] FDA and  has been authorized for  detection and/or diagnosis of SARS-CoV-2 by  FDA under an Emergency Use Authorization (EUA). This EUA will remain  in effect (meaning this test can be used) for the duration of the  Covid-19 declaration under Section 564(b)(1) of the Act, 21  U.S.C. section 360bbb-3(b)(1), unless the authorization is  terminated or revoked. Performed at [facility], [place] [address] [place], [place] [address]   Urine culture     Status: None   Collection Time: 07/13/19  8:18 AM   Specimen: In/Out Cath Urine  Result Value Ref Range Status   Specimen Description IN/OUT CATH URINE  Final   Special Requests NONE  Final   Culture   Final    NO GROWTH Performed at [facility], [place] [address] [place], [place] [address]    Report Status 07/14/2019 FINAL  Final  MRSA PCR Screening     Status: Abnormal   Collection Time: 07/13/19 10:59 AM   Specimen: Nasal Mucosa; Nasopharyngeal  Result Value Ref Range Status   MRSA by PCR POSITIVE (A) NEGATIVE Final    Comment:        The GeneXpert MRSA Assay (FDA approved for NASAL specimens only), is one component of a comprehensive MRSA colonization surveillance program. It is not intended to diagnose MRSA infection nor to guide or monitor treatment for MRSA infections. RESULT CALLED TO, READ BACK BY AND VERIFIED WITH: B. Adonis Brook RN 13:15 07/13/19 ([email or web address]) Performed at [facility], [place] [address] [place], [place] [address]   Body fluid culture     Status: None   Collection Time: 07/13/19  1:29 PM   Specimen: Synovium; Body Fluid  Result Value Ref Range Status   Specimen Description SYNOVIAL KNEE LEFT  Final   Special Requests NONE  Final   Gram Stain   Final    ABUNDANT WBC PRESENT, PREDOMINANTLY MONONUCLEAR ABUNDANT GRAM POSITIVE COCCI Performed at [facility], [place] [address] [place], [place] [address]    Culture   Final    ABUNDANT METHICILLIN RESISTANT STAPHYLOCOCCUS AUREUS   Report Status 07/15/2019 FINAL  Final    Organism ID, Bacteria METHICILLIN RESISTANT STAPHYLOCOCCUS AUREUS  Final  Susceptibility   Methicillin resistant staphylococcus aureus - MIC*    CIPROFLOXACIN >=8 RESISTANT Resistant     ERYTHROMYCIN >=8 RESISTANT Resistant     GENTAMICIN <=0.5 SENSITIVE Sensitive     OXACILLIN >=4 RESISTANT Resistant     TETRACYCLINE <=1 SENSITIVE Sensitive     VANCOMYCIN <=0.5 SENSITIVE Sensitive     TRIMETH/SULFA <=10 SENSITIVE Sensitive     CLINDAMYCIN <=0.25 SENSITIVE Sensitive     RIFAMPIN <=0.5 SENSITIVE Sensitive     Inducible Clindamycin NEGATIVE Sensitive     * ABUNDANT METHICILLIN RESISTANT STAPHYLOCOCCUS AUREUS  Group A Strep by PCR     Status: None   Collection Time: 07/13/19  4:08 PM   Specimen: Throat; Sterile Swab  Result Value Ref Range Status   Group A Strep by PCR NOT DETECTED NOT DETECTED Final    Comment: Performed at [facility], [place] [address] [place], [place] [address]  Culture, blood (routine x 2)     Status: None (Preliminary result)   Collection Time: 07/13/19  5:14 PM   Specimen: BLOOD RIGHT HAND  Result Value Ref Range Status   Specimen Description BLOOD RIGHT HAND  Final   Special Requests   Final    BOTTLES DRAWN AEROBIC ONLY Blood Culture adequate volume   Culture   Final    NO GROWTH < 24 HOURS Performed at [facility], [place] [address], [place], [place] [address]    Report Status PENDING  Incomplete  Culture, blood (routine x 2)     Status: None (Preliminary result)   Collection Time: 07/13/19  5:14 PM   Specimen: BLOOD RIGHT HAND  Result Value Ref Range Status   Specimen Description BLOOD RIGHT HAND  Final   Special Requests   Final    BOTTLES DRAWN AEROBIC ONLY Blood Culture adequate volume   Culture   Final    NO GROWTH < 24 HOURS Performed at [facility], [place] [address], [place], [place] [address]    Report Status PENDING  Incomplete     Terri Piedra, [place] for [place] Pager  07/15/2019  10:43 AM

## 2019-07-15 NOTE — Progress Notes (Signed)
VAST consulted to remove midline. Called unit and spoke with pt's nurse. Educated midlines are long PIV's and can be removed by any RN. She verbalized understanding and stated she would remove midline.

## 2019-07-15 NOTE — Progress Notes (Signed)
ANTICOAGULATION CONSULT NOTE  Pharmacy Consult for heparin Indication: atrial fibrillation  No Known Allergies  Patient Measurements: Height: 6\' 3"  (190.5 cm) Weight: 266 lb 8.6 oz (120.9 kg) IBW/kg (Calculated) : 84.5 Heparin Dosing Weight: 109.3 kg  Vital Signs: Temp: 97.7 F (36.5 C) (12/18 1900) Temp Source: Oral (12/18 1900) BP: 113/86 (12/18 1700) Pulse Rate: 78 (12/18 1500)  Labs: Recent Labs    07/13/19 0117 07/13/19 0431 07/13/19 2357 07/13/19 2357 07/14/19 0600 07/14/19 2357 07/15/19 0619 07/15/19 1928  HGB  --  11.5* 11.6*  --  11.1* 10.8*  --   --   HCT  --  34.8* 34.0*  --  32.5* 32.2*  --   --   PLT  --  164  --   --  162 197  --   --   APTT 30  --   --   --   --   --   --   --   LABPROT 17.9*  --   --   --   --   --   --   --   INR 1.5*  --   --   --   --   --   --   --   HEPARINUNFRC  --   --   --    < >  --  <0.10* <0.10* <0.10*  CREATININE  --  3.08*  --   --  2.42*  --   --   --   CKTOTAL  --   --   --   --  126  --   --   --    < > = values in this interval not displayed.    Estimated Creatinine Clearance: 46.6 mL/min (A) (by C-G formula based on SCr of 2.42 mg/dL (H)).  Assessment: 58 y.o. male with a history of atrial fibrillation who presented 07/13/19 with L knee pain and swelling and is now status post irrigation and debridement 12/16 PM. Pharmacy was consulted for IV Heparin dosing. Heparin was held for procedure and resumed this AM. Note patient was not taking Eliquis prior to admission due to cost.   Heparin level remains undetectable on 2600 units/hr (~23 units/kg/hr). ATIII slightly low at 41 but this could be normal effect of heparin (heparin is known to decrease ATIII ~30%). Patient is a difficult stick but no issues with bleeding noted. No issues with line reported.   CBC stable.  Goal of Therapy:  Heparin level 0.3-0.7 units/ml Monitor platelets by anticoagulation protocol: Yes   Plan:  Bolus heparin 3000 units x 1. Increase  heparin 2950 units/hr. Follow-up heparin level in 6 hours.   Sloan Leiter, PharmD, BCPS, BCCCP Clinical Pharmacist Please refer to [facility] for [place] numbers 07/15/2019 8:43 PM

## 2019-07-15 NOTE — Progress Notes (Signed)
ANTICOAGULATION CONSULT NOTE  Pharmacy Consult for heparin Indication: atrial fibrillation  No Known Allergies  Patient Measurements: Height: 6\' 3"  (190.5 cm) Weight: 266 lb 8.6 oz (120.9 kg) IBW/kg (Calculated) : 84.5 Heparin Dosing Weight: 109.3 kg  Vital Signs: Temp: 99.8 F (37.7 C) (12/18 0700) Temp Source: Axillary (12/18 0700) BP: 117/81 (12/18 0700) Pulse Rate: 85 (12/18 0700)  Labs: Recent Labs    07/12/19 1910 07/12/19 1910 07/13/19 0117 07/13/19 0431 07/13/19 2357 07/14/19 0600 07/14/19 1543 07/14/19 2357 07/15/19 0619  HGB 13.3  --   --  11.5* 11.6* 11.1*  --  10.8*  --   HCT 40.3  --   --  34.8* 34.0* 32.5*  --  32.2*  --   PLT 227  --   --  164  --  162  --  197  --   APTT  --   --  30  --   --   --   --   --   --   LABPROT  --   --  17.9*  --   --   --   --   --   --   INR  --   --  1.5*  --   --   --   --   --   --   HEPARINUNFRC  --    < >  --   --   --   --  <0.10* <0.10* <0.10*  CREATININE 3.36*  --   --  3.08*  --  2.42*  --   --   --   CKTOTAL  --   --   --   --   --  126  --   --   --    < > = values in this interval not displayed.    Estimated Creatinine Clearance: 46.6 mL/min (A) (by C-G formula based on SCr of 2.42 mg/dL (H)).  Assessment: 58 y.o. male with a history of atrial fibrillation who presented 07/13/19 with L knee pain and swelling and is now status post irrigation and debridement 12/16 PM. Pharmacy was consulted for IV Heparin dosing. Heparin was held for procedure and resumed this AM. Note patient was not taking Eliquis prior to admission due to cost.   Initial heparin level after restart is low at <0.10 on current rate of 2400 units/hr.  Per RN - no issues with infusion, she just confirmed the line and it is working good  CBC stable   Goal of Therapy:  Heparin level 0.3-0.7 units/ml Monitor platelets by anticoagulation protocol: Yes   Plan:  Bolus heparin 2000 units x 1 Increase heparin 2600 units/hr HL 1600 will add  on AT3 Daily HL CBC  Barth Kirks, PharmD, BCPS, BCCCP Clinical Pharmacist [phone]  Please check AMION for all [place] numbers  07/15/2019 10:26 AM

## 2019-07-15 NOTE — Evaluation (Signed)
Physical Therapy Evaluation Patient Details Name: Vernon Frye MRN: [id number] DOB: 1961/03/05 Today's Date: 07/15/2019   History of Present Illness  Pt adm with septic lt total knee. Underwent I&D and poly exchange of lt knee on 12/16. Pt with agitated delirium and possible withdrawal. PMH - Lt TKR 2016, htn, afib, anxiety, DDD, lt shoulder surgery  Clinical Impression  Pt admitted with above diagnosis and presents to PT with functional limitations due to deficits listed below (See PT problem list). Pt needs skilled PT to maximize independence and safety to allow discharge to ST-SNF unless he makes dramatic improvement.      Follow Up Recommendations SNF;Supervision/Assistance - 24 hour(unless dramatic improvement)    Equipment Recommendations  Other (comment)(To be determined)    Recommendations for Other Services       Precautions / Restrictions Precautions Precautions: Fall;Knee Required Braces or Orthoses: Knee Immobilizer - Left Knee Immobilizer - Left: Other (comment)(present in room, no specific orders) Restrictions Weight Bearing Restrictions: Yes LLE Weight Bearing: Weight bearing as tolerated      Mobility  Bed Mobility Overal bed mobility: Needs Assistance Bed Mobility: Supine to Sit;Sit to Supine     Supine to sit: +2 for physical assistance;Total assist;HOB elevated Sit to supine: +2 for physical assistance;Total assist   General bed mobility comments: Assist for all aspects  Transfers                 General transfer comment: unable to attempt  Ambulation/Gait                Stairs            Wheelchair Mobility    Modified Rankin (Stroke Patients Only)       Balance Overall balance assessment: Needs assistance Sitting-balance support: Bilateral upper extremity supported;Feet supported Sitting balance-Leahy Scale: Poor Sitting balance - Comments: Sat EOB x 3 minutes with min to mod assist. Pt c/o back pain and becoming  agitated. Returned to supine. Postural control: Posterior lean                                   Pertinent Vitals/Pain Pain Assessment: Faces Faces Pain Scale: Hurts whole lot Pain Location: lt knee and back with mobility Pain Descriptors / Indicators: Grimacing;Moaning;Guarding Pain Intervention(s): Limited activity within patient's tolerance;Monitored during session;Repositioned    Home Living Family/patient expects to be discharged to:: Private residence Living Arrangements: Alone   Type of Home: House Home Access: Stairs to enter Entrance Stairs-Rails: Right;Left;Can reach both Entrance Stairs-Number of Steps: 2 Home Layout: One level   Additional Comments: Information from prior admission. Will need to verify accuracy    Prior Function Level of Independence: Independent               Hand Dominance        Extremity/Trunk Assessment   Upper Extremity Assessment Upper Extremity Assessment: Defer to OT evaluation    Lower Extremity Assessment Lower Extremity Assessment: Generalized weakness;LLE deficits/detail LLE Deficits / Details: Passive knee ROM 5-80 degrees in supine. Active movement limited by pain and cognition       Communication   Communication: No difficulties  Cognition Arousal/Alertness: Lethargic Behavior During Therapy: Flat affect;Agitated Overall Cognitive Status: Difficult to assess  General Comments: Pt would arouse to stimuli. Followed 1 step commands inconsistently and with incr time.       General Comments General comments (skin integrity, edema, etc.): VSS    Exercises Total Joint Exercises Goniometric ROM: 5-80 degrees passively in supine   Assessment/Plan    PT Assessment Patient needs continued PT services  PT Problem List Decreased strength;Decreased range of motion;Decreased activity tolerance;Decreased balance;Decreased cognition;Pain       PT Treatment  Interventions DME instruction;Gait training;Functional mobility training;Therapeutic activities;Therapeutic exercise;Balance training;Cognitive remediation;Patient/family education;Stair training    PT Goals (Current goals can be found in the Care Plan section)  Acute Rehab PT Goals Patient Stated Goal: unable to state PT Goal Formulation: Patient unable to participate in goal setting Time For Goal Achievement: 07/29/19 Potential to Achieve Goals: Fair    Frequency Min 5X/week   Barriers to discharge Decreased caregiver support lives alone    Co-evaluation               AM-PAC PT "6 Clicks" Mobility  Outcome Measure Help needed turning from your back to your side while in a flat bed without using bedrails?: Total Help needed moving from lying on your back to sitting on the side of a flat bed without using bedrails?: Total Help needed moving to and from a bed to a chair (including a wheelchair)?: Total Help needed standing up from a chair using your arms (e.g., wheelchair or bedside chair)?: Total Help needed to walk in hospital room?: Total Help needed climbing 3-5 steps with a railing? : Total 6 Click Score: 6    End of Session   Activity Tolerance: Patient limited by lethargy;Patient limited by pain Patient left: in bed;with call bell/phone within reach;with bed alarm set Nurse Communication: Mobility status PT Visit Diagnosis: Other abnormalities of gait and mobility (R26.89);Pain Pain - Right/Left: Left Pain - part of body: Knee    Time: [id number] PT Time Calculation (min) (ACUTE ONLY): 25 min   Charges:   PT Evaluation $PT Eval Moderate Complexity: 1 Mod PT Treatments $Therapeutic Activity: 8-22 mins        [place] Pager [phone] Office [place] 07/15/2019, 12:52 PM

## 2019-07-15 NOTE — Progress Notes (Addendum)
NAME:  Vernon Frye, MRN:  [id number], DOB:  May 04, 1961, LOS: 2 ADMISSION DATE:  07/12/2019, CONSULTATION DATE:  07/13/19 REFERRING MD:  [place] [facility], CHIEF COMPLAINT:  L knee pain  Brief History   58 yo M presents with L knee pair, prior L TKR   History of present illness   History obtained from chart as patient states "I will not repeat myself again, not after that guy."   58 yo M PMH Afib, HTN, L TKR (2016) who presented to ED with CC L knee pain. Pain began 12/14 and has progressed prompting presentation. Patient endorses L knee edema, L knee erythema onset in this time period and also progressive in nature.  Denied SOB, chest pain. Denies recreational drug use. Denies sick contacts.   In ED, A fib RVR, started empirically on abx for suspected septic joint. During volume resuscitation and vanc administration, patient developed erythematous, blanching facial rash extending down neck to clavicle. Vanc was stopped and orders for benadryl, Pepcid placed. Became tachypnic and desaturated to 88%, started on 3LNC with SpO2 now 100%. PCCM consulted for admission.  No angioedema on my exam and patient denies SOB or difficulty breathing, he does state that his mouth is dry.   Relevant ED labs:  Na 130, K 3.8, Cl 95, Co2 21 BUN 44 Cr 3.08 Lactic Acid 3.3, AG 14 Phos 3.1, Mag 1.0, Alb 1.0  Alk Phos 68, AST 24, ALT 14, Tbili 1.6  Past Medical History  A fib HTN Anxiety L TKR  L shoulder arthroscopy  DDD  Significant Hospital Events   12/16 presents to ED for L knee pain. Concern for septic joint. A fiv RVR. Developed SOB after vanc administration. PCCM consulted for admission   Consults:  Ortho   Procedures:  12/16 left knee Irrigation and debridement, total knee with poly exchange    Significant Diagnostic Tests:  12/16 ECHO >  12/16 CXR > no effusion, no pneumothorax, possible mild edema  12/15 L knee XR > prior L knee replacement. Moderate joint effusion, no body  abnormality  12/16 Renal ultrasound > Mild left-sided hydronephrosis is noted. Micro Data:  12/16 SARS Cov2 >negative  12/16 BCx > positive Staph MRSA  12/16 UCx > 12/16 Strep A > Negative  12/16 Flu A/B > neg 12/16 MRSA PCR > positive  12/16 Body culture left knee > Abundant gram positive cocci   Antimicrobials:  12/16 vanc x1 12/16 ceftriaxone x1 12/16 Zosyn > 12/16 Daptomycin 12/17 >  Interim history/subjective:  RN reports intermittent agitation overnight that was improved with alterations in Precedex drip. Able to answer simple questions this morning.   Objective   Blood pressure 117/81, pulse 85, temperature 99.8 F (37.7 C), temperature source Axillary, resp. rate (!) 24, height 6' 3" (1.905 m), weight 120.9 kg, SpO2 99 %.        Intake/Output Summary (Last 24 hours) at 07/15/2019 0731 Last data filed at 07/15/2019 0700 Gross per 24 hour  Intake 3063.95 ml  Output 900 ml  Net 2163.95 ml   Filed Weights   07/13/19 0900 07/14/19 0344 07/15/19 0436  Weight: 118 kg 122.4 kg 120.9 kg    Examination: General: Adult male lying in bed, in NAD HEENT: [place]/AT, MM pink/moist, PERRL,  Neuro: Sleepy on Precedex dip but will easily arouses to verbal stimuli, able to answer simple questions, non-focal  CV: s1s2 regular rate and rhythm, no murmur, rubs, or gallops,  PULM:  Clear to ascultation bilaterally, no added breath  sounds GI: soft, bowel sounds active in all 4 quadrants, non-tender, non-distended Extremities: warm/dry, 2+ pitting lower extremity edema  Skin: no rashes or lesions  Resolved Hospital Problem list     Assessment & Plan:   MRSA Bacteremia  -In the setting of septic joint  -MRSA PCR positive  P ID following, appreciate assistance Continue Daptomycin  TTE results remain pending Will likely need TEE once mentation improves Follow repeat cultures     Left Knee pain -suspect septic joint in setting of L TKR in 2016 -Underwent I&D with partial  hardwear exchange 12/16 P Left knee culture results with abundant gram positive cocci  Ortho following, appreciate assistance  IV antibiotics as above   Acute Respiratory Insufficiency with hypoxia -following fluid bolus in ED, crackles on exam, most suspicious for pulm edema -Covid neg , Flu A/B neg  -low suspicion for PE Glossitis, mild  -Hx etoh -- wonder if there is degree of B vit deficiency here  Sore throat, tonsillar exudate  -Strep A negative  P SLP following to assist in advancement of diet  Continue supplemental oxygen for sat goal > 92% Encourage frequent and adequate pulmonary hygiene   AKI -Hx kidney stones  -Hx urinary retention  -Renal ultrasound with mild left-sided hydronephrosis P Follow renal function / urine output Trend Bmet Avoid nephrotoxins, ensure adequate renal perfusion  IV hydration  Afib RVR -non-compliant w eliquis outpatient P Amiodarone drip stopped 12/18 due to hypotension  Cardiology following, appreciate assistance  Heparin drip  Continuous telemetry   Concern for allergic drug reaction  -erythematous rash, tachypnea after IVF and vanc initiation. Most c/w [place] + pulm edema. 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 Substance Use  -denies substance use; UDS pos for Amphetamines, BZD, Opioids (Home opioids for pain, BZD for anxiety) P Continue Vitamin B supplementation  CIWA scale q4hrs Supplement thiamine, folate, and multivitamin  Wean Predecex drip and PRN Ativan for agitation   Hypomagnesemia / Hyponatremia  -suspect r/t Hx EtOH as above P Supplement as needed  Trend     Best practice:  Diet: NPO Pain/Anxiety/Delirium protocol (if indicated): na VAP protocol (if indicated): na DVT prophylaxis: heparin  GI prophylaxis: pepcid  Glucose control: monitor Mobility: BR Code Status: Full  Family Communication:  Patient updated  Disposition: ICU  Labs   CBC: Recent Labs  Lab 07/12/19 1910 07/13/19 0431 07/13/19 2357 07/14/19 0600 07/14/19 2357  WBC 16.0* 12.9*  --  16.4* 19.9*  NEUTROABS 13.5*  --   --   --   --   HGB 13.3 11.5* 11.6* 11.1* 10.8*  HCT 40.3 34.8* 34.0* 32.5* 32.2*  MCV 88.6 88.1  --  86.4 87.5  PLT 227 164  --  162 779    Basic Metabolic Panel: Recent Labs  Lab 07/12/19 1910 07/13/19 0431 07/13/19 2357 07/14/19 0600  NA 130* 130* 130* 132*  K 3.6 3.8 3.9 4.4  CL 91* 95*  --  98  CO2 22 21*  --  19*  GLUCOSE 122* 132*  --  174*  BUN 43* 44*  --  55*  CREATININE 3.36* 3.08*  --  2.42*  CALCIUM 8.7* 8.1*  --  7.6*  MG  --  1.0*  --  2.1  PHOS  --  3.1  --  3.5   GFR: Estimated Creatinine Clearance: 46.6 mL/min (A) (by C-G  formula based on SCr of 2.42 mg/dL (H)). Recent Labs  Lab 07/12/19 1910 07/13/19 0119 07/13/19 0335 07/13/19 0431 07/13/19 0513 07/14/19 0600 07/14/19 2357  PROCALCITON  --   --   --  8.28  --   --   --   WBC 16.0*  --   --  12.9*  --  16.4* 19.9*  LATICACIDVEN 4.2* 3.6* 3.3*  --  2.4*  --   --     Liver Function Tests: Recent Labs  Lab 07/12/19 1910 07/13/19 0431  AST 29 24  ALT 17 14  ALKPHOS 94 68  BILITOT 1.9* 1.6*  PROT 6.9 5.3*  ALBUMIN 2.5* 1.9*   No results for input(s): LIPASE, AMYLASE in the last 168 hours. No results for input(s): AMMONIA in the last 168 hours.  ABG    Component Value Date/Time   PHART 7.355 07/13/2019 2357   PCO2ART 35.9 07/13/2019 2357   PO2ART 72.0 (L) 07/13/2019 2357   HCO3 20.2 07/13/2019 2357   TCO2 21 (L) 07/13/2019 2357   ACIDBASEDEF 5.0 (H) 07/13/2019 2357   O2SAT 94.0 07/13/2019 2357     Coagulation Profile: Recent Labs  Lab 07/13/19 0117  INR 1.5*    Cardiac Enzymes: Recent Labs  Lab 07/14/19 0600  CKTOTAL 126    HbA1C: Hgb A1c MFr Bld  Date/Time Value Ref Range Status  07/13/2019 04:08 PM 6.3 (H) 4.8 - 5.6 % Final    Comment:    (NOTE) Pre diabetes:           5.7%-6.4% Diabetes:              >6.4% Glycemic control for   <7.0% adults with diabetes     CBG: Recent Labs  Lab 07/13/19 1058 07/13/19 2309 07/14/19 1059  GLUCAP 79 141* 164*     Critical care time: 45 min   Performed by: Johnsie Cancel   Total critical care time: 38 minutes  Critical care time was exclusive of separately billable procedures and treating other patients.  Critical care was necessary to treat or prevent imminent or life-threatening deterioration.  Critical care was time spent personally by me on the following activities: development of treatment plan with patient and/or surrogate as well as nursing, discussions with consultants, evaluation of patient's response to treatment, examination of patient, obtaining history from patient or surrogate, ordering and performing treatments and interventions, ordering and review of laboratory studies, ordering and review of radiographic studies, pulse oximetry and re-evaluation of patient's condition.  Johnsie Cancel, NP-C [facility] Contact / Pager information can be found on Amion  07/15/2019, 7:31 AM

## 2019-07-16 ENCOUNTER — Inpatient Hospital Stay: Payer: Self-pay

## 2019-07-16 DIAGNOSIS — J9601 Acute respiratory failure with hypoxia: Secondary | ICD-10-CM

## 2019-07-16 DIAGNOSIS — F10931 Alcohol use, unspecified with withdrawal delirium: Secondary | ICD-10-CM

## 2019-07-16 DIAGNOSIS — G9340 Encephalopathy, unspecified: Secondary | ICD-10-CM

## 2019-07-16 LAB — CBC
HCT: 32.8 % — ABNORMAL LOW (ref 39.0–52.0)
Hemoglobin: 11 g/dL — ABNORMAL LOW (ref 13.0–17.0)
MCH: 29.7 pg (ref 26.0–34.0)
MCHC: 33.5 g/dL (ref 30.0–36.0)
MCV: 88.6 fL (ref 80.0–100.0)
Platelets: 127 10*3/uL — ABNORMAL LOW (ref 150–400)
RBC: 3.7 MIL/uL — ABNORMAL LOW (ref 4.22–5.81)
RDW: 15.3 % (ref 11.5–15.5)
WBC: 19 10*3/uL — ABNORMAL HIGH (ref 4.0–10.5)
nRBC: 0.2 % (ref 0.0–0.2)

## 2019-07-16 LAB — ECHOCARDIOGRAM COMPLETE

## 2019-07-16 MED ORDER — HALOPERIDOL LACTATE 5 MG/ML IJ SOLN
5.0000 mg | INTRAMUSCULAR | Status: DC | PRN
  Administered 2019-07-16 – 2019-07-26 (×9): 5 mg via INTRAVENOUS
  Filled 2019-07-16 (×11): qty 1

## 2019-07-16 MED ORDER — ACETAMINOPHEN 325 MG PO TABS
650.0000 mg | ORAL_TABLET | Freq: Three times a day (TID) | ORAL | Status: AC
  Administered 2019-07-16 – 2019-07-18 (×9): 650 mg via ORAL
  Filled 2019-07-16 (×8): qty 2

## 2019-07-16 MED ORDER — OXYCODONE HCL 5 MG PO TABS
5.0000 mg | ORAL_TABLET | Freq: Four times a day (QID) | ORAL | Status: DC | PRN
  Administered 2019-07-16 – 2019-07-17 (×3): 5 mg via ORAL
  Filled 2019-07-16 (×4): qty 1

## 2019-07-16 MED ORDER — ENOXAPARIN SODIUM 120 MG/0.8ML ~~LOC~~ SOLN
120.0000 mg | Freq: Two times a day (BID) | SUBCUTANEOUS | Status: DC
  Administered 2019-07-16 – 2019-07-20 (×10): 120 mg via SUBCUTANEOUS
  Filled 2019-07-16 (×11): qty 0.8

## 2019-07-16 MED ORDER — OXYCODONE HCL 5 MG PO TABS: 5.0000 mg | ORAL_TABLET | ORAL | Status: DC | PRN

## 2019-07-16 MED ORDER — QUETIAPINE FUMARATE 25 MG PO TABS
25.0000 mg | ORAL_TABLET | Freq: Two times a day (BID) | ORAL | Status: DC
  Administered 2019-07-16 – 2019-07-27 (×21): 25 mg via ORAL
  Filled 2019-07-16 (×24): qty 1

## 2019-07-16 NOTE — Progress Notes (Signed)
ANTICOAGULATION CONSULT NOTE   Pharmacy Consult for heparin Indication: atrial fibrillation  No Known Allergies  Patient Measurements: Height: 6\' 3"  (190.5 cm) Weight: 266 lb 5.1 oz (120.8 kg) IBW/kg (Calculated) : 84.5 Heparin Dosing Weight: 109.3 kg  Vital Signs: Temp: 97.6 F (36.4 C) (12/19 0714) Temp Source: Oral (12/19 0714) BP: 108/75 (12/19 0700) Pulse Rate: 39 (12/19 0700)  Labs: Recent Labs    07/14/19 0600 07/14/19 2357 07/15/19 0619 07/15/19 1928 07/16/19 0344  HGB 11.1* 10.8*  --   --  11.0*  HCT 32.5* 32.2*  --   --  32.8*  PLT 162 197  --   --  127*  HEPARINUNFRC  --  <0.10* <0.10* <0.10*  --   CREATININE 2.42*  --   --   --   --   CKTOTAL 126  --   --   --   --     Estimated Creatinine Clearance: 46.6 mL/min (A) (by C-G formula based on SCr of 2.42 mg/dL (H)).  Assessment: 58 y.o. male with a history of atrial fibrillation who presented 07/13/19 with L knee pain and swelling and is now status post irrigation and debridement 12/16 PM. Pharmacy was consulted for IV Heparin dosing. Heparin was held for procedure and resumed this AM. Note patient was not taking Eliquis prior to admission due to cost.   The patient's midline was removed yesterday and the patient has been a difficult stick - several attempts to obtain a heparin level this morning have not been successful. Discussed with MD and the plan is to transition the patient to lovenox at this time. Will attempt a LMWH level at steady state to confirm therapeutic.   Goal of Therapy:  Anti-Xa level 0.6-1 units/ml 4hrs after LMWH dose given Monitor platelets by anticoagulation protocol: Yes   Plan:  - D/c Heparin - Start Lovenox 120 mg SQ every 12 hours (first dose 1 hour after Heparin has been stopped) - Transition discussed with RN Doroteo Bradford) - Will continue to monitor for any signs/symptoms of bleeding and will follow up with renal function for any necessary dose adjustments   Thank you for allowing  pharmacy to be a part of this patient's care.  Alycia Rossetti, PharmD, BCPS Clinical Pharmacist Clinical phone for 07/16/2019: [phone] 07/16/2019 7:45 AM   **Pharmacist phone directory can now be found on [email or web address] ([email or web address]).  Listed under [place].

## 2019-07-16 NOTE — Progress Notes (Signed)
Progress Note  Patient Name: Vernon Frye Date of Encounter: 07/16/2019  Primary Cardiologist: Sinclair Grooms, MD  Subjective   Sleeping in bed. On my interview he denies pain, says he just wants to go back to sleep. Does not converse more than that.  Inpatient Medications    Scheduled Meds: . acetaminophen  650 mg Oral Q8H  . Chlorhexidine Gluconate Cloth  6 each Topical Daily  . docusate sodium  100 mg Oral BID  . folic acid  1 mg Oral Daily  . multivitamin with minerals  1 tablet Oral Daily  . QUEtiapine  25 mg Oral BID  . thiamine  100 mg Oral Daily   Or  . thiamine  100 mg Intravenous Daily   Continuous Infusions: . sodium chloride 50 mL/hr at 07/15/19 1700  . DAPTOmycin (CUBICIN)  IV 50 mL/hr at 07/16/19 0800  . dexmedetomidine (PRECEDEX) IV infusion 0.5 mcg/kg/hr (07/16/19 0800)  . heparin 2,950 Units/hr (07/16/19 0800)   PRN Meds: diphenhydrAMINE, haloperidol lactate, HYDROcodone-acetaminophen, HYDROcodone-acetaminophen, menthol-cetylpyridinium **OR** phenol, ondansetron **OR** ondansetron (ZOFRAN) IV, sodium chloride flush   Vital Signs    Vitals:   07/16/19 0600 07/16/19 0630 07/16/19 0700 07/16/19 0714  BP: 106/76 117/77 108/75   Pulse: 73 65 (!) 39   Resp: 18 19 18    Temp:    97.6 F (36.4 C)  TempSrc:    Oral  SpO2: 100% 100% 100%   Weight:      Height:        Intake/Output Summary (Last 24 hours) at 07/16/2019 0929 Last data filed at 07/16/2019 0800 Gross per 24 hour  Intake 2875.62 ml  Output 2925 ml  Net -49.38 ml   Last 3 Weights 07/16/2019 07/15/2019 07/14/2019  Weight (lbs) 266 lb 5.1 oz 266 lb 8.6 oz 269 lb 13.5 oz  Weight (kg) 120.8 kg 120.9 kg 122.4 kg      Telemetry    Rate controlled atrial fibrillation - Personally Reviewed  ECG    Today, afib at 75 bpm - Personally Reviewed  Physical Exam   GEN: No acute distress.   Neck: No JVD Cardiac: irregularly irregular, no murmurs, rubs, or gallops.  Respiratory: Clear  to auscultation bilaterally. GI: Soft, nontender, non-distended  MS: significant LE edema. Left knee wrapped. Neuro:  Nonfocal  Psych: Normal affect   Labs    High Sensitivity Troponin:  No results for input(s): TROPONINIHS in the last 720 hours.    Chemistry Recent Labs  Lab 07/12/19 1910 07/13/19 0431 07/13/19 2357 07/14/19 0600  NA 130* 130* 130* 132*  K 3.6 3.8 3.9 4.4  CL 91* 95*  --  98  CO2 22 21*  --  19*  GLUCOSE 122* 132*  --  174*  BUN 43* 44*  --  55*  CREATININE 3.36* 3.08*  --  2.42*  CALCIUM 8.7* 8.1*  --  7.6*  PROT 6.9 5.3*  --   --   ALBUMIN 2.5* 1.9*  --   --   AST 29 24  --   --   ALT 17 14  --   --   ALKPHOS 94 68  --   --   BILITOT 1.9* 1.6*  --   --   GFRNONAA 19* 21*  --  28*  GFRAA 22* 25*  --  33*  ANIONGAP 17* 14  --  15     Hematology Recent Labs  Lab 07/14/19 0600 07/14/19 2357 07/16/19 0344  WBC 16.4* 19.9*  19.0*  RBC 3.76* 3.68* 3.70*  HGB 11.1* 10.8* 11.0*  HCT 32.5* 32.2* 32.8*  MCV 86.4 87.5 88.6  MCH 29.5 29.3 29.7  MCHC 34.2 33.5 33.5  RDW 14.5 14.7 15.3  PLT 162 197 127*    BNP Recent Labs  Lab 07/13/19 0431  BNP 299.7*     DDimer  Recent Labs  Lab 07/13/19 1608  DDIMER 3.78*     Radiology    [place] EKG SITE RITE  Result Date: 07/16/2019 If Site Rite image not attached, placement could not be confirmed due to current cardiac rhythm.   Cardiac Studies   Echo final read stuck in reporting system, working on releasing this. EF normal on my review, no obvious vegetations  Patient Profile     58 y.o. male with recent septic left knee (prosthetic joint infection), MRSA bacteremia. We are following due to post op atrial fibrillation with RVR  Assessment & Plan    Atrial fibrillation with RVR: likely exacerbated by sepsis and immediate postoperative period -on heparin for anticoagulation. Notes suggest previously nonadherent to apixaban -received IV amiodarone post op. Now off. -BP stable but on low  end -not currently on specific AV nodal agents and is rate controlled. May be partially due to precedex. Monitor HR once precedex off, was on high dose metoprolol as an outpatient, so I suspect he will need at least some metoprolol once precedex is off  Sepsis, MRSA bacteremia: s/p irrigation and debridement, antibiotic spacer placement -ID following, on daptomycin -TTE as above. Issue with Syngo, listed as prelim. We will work to release the report. -TEE when mental status is clear/prior to discharge. Will not urgently change management. -IV access has been an issue, defer to primary team/ID  For questions or updates, please contact [place] Please consult [email or web address] for contact info under     Signed, Buford Dresser, MD  07/16/2019, 9:29 AM

## 2019-07-16 NOTE — Progress Notes (Signed)
Securechat with Dr Megan Salon; prefers to wait another 24 hours before PICC placement. RN is aware

## 2019-07-16 NOTE — Progress Notes (Signed)
  Speech Language Pathology Treatment: Dysphagia  Patient Details Name: Vernon Frye MRN: [id number] DOB: 1961/02/27 Today's Date: 07/16/2019 Time: [id number] SLP Time Calculation (min) (ACUTE ONLY): 23 min  Assessment / Plan / Recommendation Clinical Impression  Pt awareness much improved, he is alert and asking for water upon arrival, though still needs intermittent cues to remain alert throughout session. Pt is overtly weak, cannot achieve a labial seal without total assist from SLP to seal lips to straw. There is immediate coughing with sips of thin liquids from a cup and straw, at the beginning and end of session. Nectar and puree were consumed without coughing or wet vocal quality regardless of rate or bolus size. Recommend pt initiate a puree diet with nectar thick liquids. Will follow for diet advancement as pt continues to improve medically.   HPI HPI: 58 year old man who is critically ill due to a severe agitated delirium due to toxic metabolic encephalopathy requiring titration of Precedex infusion to maintain patient safety. MRSA bacteremia secondary to septic right joint now status post washout. Overnight pt had issues with agitation and was on BiPAP.       SLP Plan  Continue with current plan of care       Recommendations  Diet recommendations: Dysphagia 1 (puree);Nectar-thick liquid Liquids provided via: Cup;Teaspoon Medication Administration: Whole meds with puree Supervision: Staff to assist with self feeding;Full supervision/cueing for compensatory strategies Compensations: Slow rate;Small sips/bites Postural Changes and/or Swallow Maneuvers: Seated upright 90 degrees                Oral Care Recommendations: Oral care BID Follow up Recommendations: 24 hour supervision/assistance SLP Visit Diagnosis: Dysphagia, unspecified (R13.10) Plan: Continue with current plan of care       GO               Herbie Baltimore, MA [place] Pager [phone] Office [phone]  Lynann Beaver 07/16/2019, 9:16 AM

## 2019-07-16 NOTE — NC FL2 (Signed)
[place] LEVEL OF CARE SCREENING TOOL     IDENTIFICATION  Patient Name: Vernon Frye Birthdate: 1960-10-13 Sex: male Admission Date (Current Location): 07/12/2019  [facility] and [place] Number:  Herbalist and Address:  The [facility], [place] [address], [place], [place] [address]      Provider Number: [id number]  Attending Physician Name and Address:  Rush Farmer, MD  Relative Name and Phone Number:  Rodgerick Isaak, Mother & Michail Jewels, Friend, [phone]    Current Level of Care: Hospital Recommended Level of Care: [place] Prior Approval Number:    Date Approved/Denied:   PASRR Number: Under Manual Review  Discharge Plan: SNF    Current Diagnoses: Patient Active Problem List   Diagnosis Date Noted  . MRSA bacteremia 07/14/2019  . 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 Acute pain of left knee   . Tachypnea   . Septic joint ([place]) 07/13/2019  . Atrial fibrillation with rapid ventricular response ([place]) 07/13/2019  . AKI (acute kidney injury) ([place]) 07/13/2019  . Hyponatremia 07/13/2019  . Alcohol use 07/13/2019  . Severe sepsis ([place]) 07/13/2019  . Severe sepsis with acute organ dysfunction ([place]) 07/13/2019  . Respiratory insufficiency 07/13/2019  . Hypotension   . 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 S/P knee replacement 06/19/2015  . Rotator cuff tear 01/16/2014  . HYPERTENSION 01/31/2010  . PNEUMONIA 01/31/2010  . SWELLING, MASS, OR LUMP IN CHEST 01/31/2010    Orientation RESPIRATION BLADDER Height & Weight     Self, Time, Place  O2, Other (Comment)(100, [place], 2L; Bipap- full face mask (size XL), resp rate 24, set rate 10, IPAP 12, EPAP 6, 02% 30, flow rate .09) Incontinent, External catheter Weight: 266 lb 5.1 oz (120.8 kg) Height:  6\' 3"  (190.5 cm)  BEHAVIORAL SYMPTOMS/MOOD NEUROLOGICAL BOWEL NUTRITION STATUS      Continent Diet(Dys 1 diet, nectar thick liquids)  AMBULATORY STATUS  COMMUNICATION OF NEEDS Skin   Extensive Assist Verbally Surgical wounds(closed incision on leg (dressing in place))                       Personal Care Assistance Level of Assistance  Bathing, Feeding, Dressing Bathing Assistance: Maximum assistance Feeding assistance: Limited assistance Dressing Assistance: Maximum assistance     Functional Limitations Info  Sight, Hearing, Speech Sight Info: Adequate Hearing Info: Adequate Speech Info: Adequate    SPECIAL CARE FACTORS FREQUENCY  PT (By licensed PT), OT (By licensed OT), Speech therapy     PT Frequency: 5x/wk OT Frequency: 5x/wk     Speech Therapy Frequency: 2x/wk      Contractures Contractures Info: Not present    Additional Factors Info  Code Status, Allergies, Psychotropic Code Status Info: Full Code Allergies Info: No Known Allergies Psychotropic Info: seroquel 25mg  2x daily         Current Medications (07/16/2019):  This is the current hospital active medication list Current Facility-Administered Medications  Medication Dose Route Frequency Provider Last Rate Last Admin  . 0.9 %  sodium chloride infusion   Intravenous Continuous Merlene Laughter F, NP 50 mL/hr at 07/15/19 1700 Rate Verify at 07/15/19 1700  . acetaminophen (TYLENOL) tablet 650 mg  650 mg Oral Q8H Spero Geralds, MD   650 mg at 07/16/19 1040  . Chlorhexidine Gluconate Cloth 2 % PADS 6 each  6 each Topical Daily Paralee Cancel, MD   6 each at 07/15/19 [phone]  . DAPTOmycin (  CUBICIN) 1,000 mg in sodium chloride 0.9 % IVPB  1,000 mg Intravenous Q2000 Rush Farmer, MD 50 mL/hr at 07/16/19 1000 Rate Verify at 07/16/19 1000  . dexmedetomidine (PRECEDEX) 400 MCG/100ML (4 mcg/mL) infusion  0.4-1.2 mcg/kg/hr Intravenous Titrated Johnsie Cancel, NP   Stopped at 07/16/19 [phone]  . diphenhydrAMINE (BENADRYL) injection 12.5 mg  12.5 mg Intravenous Q6H PRN Paralee Cancel, MD      . docusate sodium (COLACE) capsule 100 mg  100 mg Oral BID Paralee Cancel, MD    100 mg at 07/16/19 [id number]  . enoxaparin (LOVENOX) injection 120 mg  120 mg Subcutaneous Q12H Rolla Flatten, [facility]      . folic acid (FOLVITE) tablet 1 mg  1 mg Oral Daily Paralee Cancel, MD   1 mg at 07/16/19 [id number]  . haloperidol lactate (HALDOL) injection 5 mg  5 mg Intravenous Q4H PRN Spero Geralds, MD      . HYDROcodone-acetaminophen (NORCO) 7.5-325 MG per tablet 1-2 tablet  1-2 tablet Oral Q4H PRN Paralee Cancel, MD      . HYDROcodone-acetaminophen (NORCO/VICODIN) 5-325 MG per tablet 1-2 tablet  1-2 tablet Oral Q4H PRN Paralee Cancel, MD      . menthol-cetylpyridinium (CEPACOL) lozenge 3 mg  1 lozenge Oral PRN Paralee Cancel, MD       Or  . phenol (CHLORASEPTIC) mouth spray 1 spray  1 spray Mouth/Throat PRN Paralee Cancel, MD      . multivitamin with minerals tablet 1 tablet  1 tablet Oral Daily Paralee Cancel, MD   1 tablet at 07/16/19 [phone]  . ondansetron (ZOFRAN) tablet 4 mg  4 mg Oral Q6H PRN Paralee Cancel, MD       Or  . ondansetron [facility]) injection 4 mg  4 mg Intravenous Q6H PRN Paralee Cancel, MD      . QUEtiapine (SEROQUEL) tablet 25 mg  25 mg Oral BID Spero Geralds, MD   25 mg at 07/16/19 0939  . sodium chloride flush (NS) 0.9 % injection 10-40 mL  10-40 mL Intracatheter PRN Paralee Cancel, MD      . thiamine tablet 100 mg  100 mg Oral Daily Paralee Cancel, MD   100 mg at 07/16/19 [id number]   Or  . thiamine (B-1) injection 100 mg  100 mg Intravenous Daily Paralee Cancel, MD   100 mg at 07/14/19 1039     Discharge Medications: Please see discharge summary for a list of discharge medications.  Relevant Imaging Results:  Relevant Lab Results:   Additional Information SSN: [id number]; COVID negative on 07/13/2019  Christa Fasig B Arianna Haydon, LCSWA

## 2019-07-16 NOTE — Progress Notes (Signed)
NAME:  Vernon Frye, MRN:  [id number], DOB:  [birth year], LOS: 3 ADMISSION DATE:  07/12/2019, CONSULTATION DATE:  07/13/19 REFERRING MD:  [place], CHIEF COMPLAINT:  L knee pain  Brief History   58 yo M presents with L knee pair, prior L TKR   History of present illness   History obtained from chart as patient states "I will not repeat myself again, not after that guy."   58 yo M PMH Afib, HTN, L TKR (2016) who presented to ED with CC L knee pain. Pain began 12/14 and has progressed prompting presentation. Patient endorses L knee edema, L knee erythema onset in this time period and also progressive in nature.  Denied SOB, chest pain. Denies recreational drug use. Denies sick contacts.   In ED, A fib RVR, started empirically on abx for suspected septic joint. During volume resuscitation and vanc administration, patient developed erythematous, blanching facial rash extending down neck to clavicle. Vanc was stopped and orders for benadryl, Pepcid placed. Became tachypnic and desaturated to 88%, started on 3LNC with SpO2 now 100%. PCCM consulted for admission.  No angioedema on my exam and patient denies SOB or difficulty breathing, he does state that his mouth is dry.   Relevant ED labs:  Na 130, K 3.8, Cl 95, Co2 21 BUN 44 Cr 3.08 Lactic Acid 3.3, AG 14 Phos 3.1, Mag 1.0, Alb 1.0  Alk Phos 68, AST 24, ALT 14, Tbili 1.6  Past Medical History  A fib HTN Anxiety L TKR  L shoulder arthroscopy  DDD  Significant Hospital Events   12/16 presents to ED for L knee pain. Concern for septic joint. A fiv RVR. Developed SOB after vanc administration. PCCM consulted for admission    Consults:  Ortho   Procedures:  12/16 left knee Irrigation and debridement, total knee with poly exchange    Significant Diagnostic Tests:  12/16 ECHO >  12/16 CXR > no effusion, no pneumothorax, possible mild edema  12/15 L knee XR > prior L knee replacement. Moderate joint effusion, no body  abnormality  12/16 Renal ultrasound > Mild left-sided hydronephrosis is noted. Micro Data:  12/16 SARS Cov2 >negative  12/16 BCx > positive Staph MRSA  12/16 UCx > 12/16 Strep A > Negative  12/16 Flu A/B > neg 12/16 MRSA PCR > positive  12/16 Body culture left knee > Abundant gram positive cocci   Antimicrobials:  12/16 vanc x1 12/16 ceftriaxone x1 12/16 Zosyn > 12/16 Daptomycin 12/17 >  Interim history/subjective:  Overnight agitated. Still on precedex. Midline was pulled yesterday for HAI prevention. The patient now has no way to get blood draws as 4 people have tried and failed. And he is on heparin drip for A fib, now rate controlled.   Objective   Blood pressure 108/75, pulse (!) 39, temperature 97.6 F (36.4 C), temperature source Oral, resp. rate 18, height '6\' 3"'  (1.905 m), weight 120.8 kg, SpO2 100 %.        Intake/Output Summary (Last 24 hours) at 07/16/2019 0809 Last data filed at 07/16/2019 0800 Gross per 24 hour  Intake 2875.62 ml  Output 2925 ml  Net -49.38 ml   Filed Weights   07/14/19 0344 07/15/19 0436 07/16/19 0500  Weight: 122.4 kg 120.9 kg 120.8 kg    Examination: General: Adult male lying in bed, in NAD, awake but agitated HEENT: [place]/AT, MM pink/moist, PERRL,  Neuro: awake but agitated, able to follow simple commands.  CV: irregularly irregular, HR in  70s, no mrg PULM:  Clear to ascultation bilaterally, no added breath sounds GI: soft, bowel sounds active in all 4 quadrants, non-tender, non-distended Extremities: warm/dry, 2+ pitting lower extremity edema  Skin: no rashes or lesions  Resolved Hospital Problem list     Assessment & Plan:   MRSA Bacteremia  -In the setting of septic joint  -MRSA PCR positive  P ID following, appreciate assistance Continue Daptomycin  TTE was done and final report is still pending.  He will need a TEE.  Cultures have now cleared.  PICC line for IV abx is planned  Left Knee Septic joint -suspect septic  joint in setting of L TKR in 2016 -Underwent I&D with partial hardwear exchange 12/16 P Left knee culture results with abundant gram positive cocci  Ortho following, appreciate assistance  IV antibiotics as above   Acute encephalopathy secondary to agitated delirium - Requiring precedex drip.  - multifactorial could also be from polysubstance abuse or etoh withdrawal.  P - start seroquel scheduled - prn haldol - will obtain repeat EKG for QTc - monitor for etoh withdrawal signs.  - hold benzos for now as they worsen delirium.    Acute Respiratory Insufficiency with hypoxia -following fluid bolus in ED, crackles on exam, most suspicious for pulm edema -Covid neg , Flu A/B neg  -low suspicion for PE Glossitis, mild  -Hx etoh -- wonder if there is degree of B vit deficiency here  Sore throat, tonsillar exudate  -Strep A negative  P SLP following to assist in advancement of diet  Continue supplemental oxygen for sat goal > 92% Encourage frequent and adequate pulmonary hygiene   AKI -Hx kidney stones  -Hx urinary retention  -Renal ultrasound with mild left-sided hydronephrosis P Follow renal function / urine output Trend Bmet Avoid nephrotoxins, ensure adequate renal perfusion  IV hydration  Afib RVR -non-compliant w eliquis outpatient P Amiodarone drip stopped 12/18 due to hypotension  Cardiology following, appreciate assistance  Heparin drip  Continuous telemetry   Concern for allergic drug reaction  -erythematous rash, tachypnea after IVF and vanc initiation. Most c/w [place] + pulm edema. No angioedema -Pepcid, Benadryl and solumedrol provided in ED P No furthur Vancomycin. This is now resolved.   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 but social use now Substance Use  -denies substance use; UDS pos for Amphetamines, BZD, Opioids (Home opioids for pain, BZD for anxiety)  P Continue Vitamin B supplementation  CIWA scale q4hrs Supplement thiamine, folate, and  multivitamin  Wean Predecex drip and PRN Ativan for agitation   Hypomagnesemia / Hyponatremia  -suspect r/t Hx EtOH as above P Supplement as needed  Trend      Best practice:  Diet: NPO- endorses hunger will do bedside trial and advance as tolerated.  Pain/Anxiety/Delirium protocol (if indicated): na VAP protocol (if indicated): na DVT prophylaxis: heparin  GI prophylaxis: pepcid  Glucose control: monitor Mobility: will change to OOB with PT Code Status: Full  Family Communication: Patient updated  Disposition: ICU  The patient is critically ill with multiple organ systems failure and requires high complexity decision making for assessment and support, frequent evaluation and titration of therapies, application of advanced monitoring technologies and extensive interpretation of multiple databases.   Critical Care Time devoted to patient care services described in this note is 37 minutes. This time reflects time of care of this [place] . This critical care time does not reflect separately billable procedures or procedure time,  teaching time or supervisory time of PA/NP/Med student/Med Resident etc but could involve care discussion time.  [facility] 07/16/2019 8:27 AM  Pager: [phone] After hours pager: [phone]    Labs   CBC: Recent Labs  Lab 07/12/19 1910 07/13/19 0431 07/13/19 2357 07/14/19 0600 07/14/19 2357 07/16/19 0344  WBC 16.0* 12.9*  --  16.4* 19.9* 19.0*  NEUTROABS 13.5*  --   --   --   --   --   HGB 13.3 11.5* 11.6* 11.1* 10.8* 11.0*  HCT 40.3 34.8* 34.0* 32.5* 32.2* 32.8*  MCV 88.6 88.1  --  86.4 87.5 88.6  PLT 227 164  --  162 197 127*    Basic Metabolic Panel: Recent Labs  Lab 07/12/19 1910 07/13/19 0431 07/13/19 2357 07/14/19 0600  NA 130* 130* 130* 132*  K 3.6 3.8 3.9 4.4  CL 91* 95*  --  98  CO2 22 21*  --  19*  GLUCOSE 122* 132*  --  174*  BUN 43* 44*  --  55*    CREATININE 3.36* 3.08*  --  2.42*  CALCIUM 8.7* 8.1*  --  7.6*  MG  --  1.0*  --  2.1  PHOS  --  3.1  --  3.5   GFR: Estimated Creatinine Clearance: 46.6 mL/min (A) (by C-G formula based on SCr of 2.42 mg/dL (H)). Recent Labs  Lab 07/12/19 1910 07/13/19 0119 07/13/19 0335 07/13/19 0431 07/13/19 0513 07/14/19 0600 07/14/19 2357 07/16/19 0344  PROCALCITON  --   --   --  8.28  --   --   --   --   WBC 16.0*  --   --  12.9*  --  16.4* 19.9* 19.0*  LATICACIDVEN 4.2* 3.6* 3.3*  --  2.4*  --   --   --     Liver Function Tests: Recent Labs  Lab 07/12/19 1910 07/13/19 0431  AST 29 24  ALT 17 14  ALKPHOS 94 68  BILITOT 1.9* 1.6*  PROT 6.9 5.3*  ALBUMIN 2.5* 1.9*   No results for input(s): LIPASE, AMYLASE in the last 168 hours. No results for input(s): AMMONIA in the last 168 hours.  ABG    Component Value Date/Time   PHART 7.355 07/13/2019 2357   PCO2ART 35.9 07/13/2019 2357   PO2ART 72.0 (L) 07/13/2019 2357   HCO3 20.2 07/13/2019 2357   TCO2 21 (L) 07/13/2019 2357   ACIDBASEDEF 5.0 (H) 07/13/2019 2357   O2SAT 94.0 07/13/2019 2357     Coagulation Profile: Recent Labs  Lab 07/13/19 0117  INR 1.5*    Cardiac Enzymes: Recent Labs  Lab 07/14/19 0600  CKTOTAL 126    HbA1C: Hgb A1c MFr Bld  Date/Time Value Ref Range Status  07/13/2019 04:08 PM 6.3 (H) 4.8 - 5.6 % Final    Comment:    (NOTE) Pre diabetes:          5.7%-6.4% Diabetes:              >6.4% Glycemic control for   <7.0% adults with diabetes     CBG: Recent Labs  Lab 07/13/19 1058 07/13/19 2309 07/14/19 1059  GLUCAP 79 141* 164*

## 2019-07-16 NOTE — Evaluation (Signed)
Occupational Therapy Evaluation Patient Details Name: Vernon Frye MRN: [id number] DOB: 1960/11/28 Today's Date: 07/16/2019    History of Present Illness Pt adm with septic lt total knee. Underwent I&D and poly exchange of lt knee on 12/16. Pt with agitated delirium and possible withdrawal. PMH - Lt TKR 2016, htn, afib, anxiety, DDD, lt shoulder surgery   Clinical Impression   This 58 yo male admitted with above presents to acute OT with decreased cognition, increased pain, decreased mobility all affecting his ability to care for himself and he reports he lives alone. He will benefit from acute OT with follow up OT at SNF.    Follow Up Recommendations  SNF;Supervision/Assistance - 24 hour    Equipment Recommendations  Other (comment)(TBD at next venue)       Precautions / Restrictions Precautions Precautions: Fall;Knee Required Braces or Orthoses: Knee Immobilizer - Left Knee Immobilizer - Left: Other (comment)(present in room, no specific orders) Restrictions Weight Bearing Restrictions: Yes LLE Weight Bearing: Weight bearing as tolerated      Mobility Bed Mobility Overal bed mobility: Needs Assistance Bed Mobility: Sit to Sidelying;Supine to Sit;Rolling Rolling: +2 for physical assistance;Max assist   Supine to sit: +2 for physical assistance;Total assist;HOB elevated   Sit to sidelying: +2 for physical assistance;Total assist General bed mobility comments: Assist for all aspects  Transfers                 General transfer comment: unable to attempt safely    Balance Overall balance assessment: Needs assistance Sitting-balance support: Bilateral upper extremity supported;Feet supported Sitting balance-Leahy Scale: Poor Sitting balance - Comments: Sat EOB with min assist. Pt c/o back pain and wanted to lean back. With cues and encouragement pt remained sitting 6-8 minutes Postural control: Posterior lean                                 ADL  either performed or assessed with clinical judgement   ADL Overall ADL's : Needs assistance/impaired                                       General ADL Comments: Patient currently total A for all basic ADLs due to weakness and confusion     Vision Patient Visual Report: No change from baseline              Pertinent Vitals/Pain Pain Assessment: Faces Faces Pain Scale: Hurts even more Pain Location: lt knee and back with mobility Pain Descriptors / Indicators: Grimacing;Moaning;Guarding Pain Intervention(s): Limited activity within patient's tolerance;Monitored during session;Repositioned     Hand Dominance Right   Extremity/Trunk Assessment Upper Extremity Assessment Upper Extremity Assessment: Generalized weakness           Communication Communication Communication: No difficulties   Cognition Arousal/Alertness: Awake/alert;Lethargic Behavior During Therapy: Flat affect Overall Cognitive Status: Difficult to assess Area of Impairment: Orientation;Attention;Memory;Following commands;Safety/judgement;Awareness;Problem solving                 Orientation Level: Disoriented to;Time;Situation Current Attention Level: Sustained Memory: Decreased short-term memory;Decreased recall of precautions Following Commands: Follows one step commands with increased time;Follows one step commands inconsistently Safety/Judgement: Decreased awareness of safety;Decreased awareness of deficits Awareness: Intellectual Problem Solving: Slow processing;Decreased initiation;Difficulty sequencing;Requires verbal cues;Requires tactile cues General Comments: Pt lethargic on arrival. Did arouse to stimuli but remained groggy  General Comments  VSS    Exercises Exercises: Total Joint Total Joint Exercises Goniometric ROM: 65 degrees passisve flexion in sitting. -5 degrees passive extension in supine        Home Living Family/patient expects to be discharged to::  Private residence Living Arrangements: Alone   Type of Home: House Home Access: Stairs to enter [employer] of Steps: 2 Entrance Stairs-Rails: Right;Left;Can reach both Home Layout: One level     Bathroom Shower/Tub: Teacher, early years/pre: Handicapped height         Additional Comments: Information from prior admission. Will need to verify accuracy      Prior Functioning/Environment Level of Independence: Independent                 OT Problem List: Decreased strength;Decreased range of motion;Decreased activity tolerance;Impaired balance (sitting and/or standing);Decreased knowledge of use of DME or AE;Decreased cognition;Decreased safety awareness;Pain      OT Treatment/Interventions: Self-care/ADL training;DME and/or AE instruction;Patient/family education;Balance training;Therapeutic activities    OT Goals(Current goals can be found in the care plan section) Acute Rehab OT Goals Patient Stated Goal: unable to state OT Goal Formulation: Patient unable to participate in goal setting Time For Goal Achievement: 07/30/19 Potential to Achieve Goals: Fair  OT Frequency: Min 2X/week   Barriers to D/C: Decreased caregiver support          Co-evaluation PT/OT/SLP Co-Evaluation/Treatment: Yes Reason for Co-Treatment: Complexity of the patient's impairments (multi-system involvement);Necessary to address cognition/behavior during functional activity;For patient/therapist safety PT goals addressed during session: Mobility/safety with mobility;Balance OT goals addressed during session: Strengthening/ROM      AM-PAC OT "6 Clicks" Daily Activity     Outcome Measure Help from another person eating meals?: Total Help from another person taking care of personal grooming?: Total Help from another person toileting, which includes using toliet, bedpan, or urinal?: Total Help from another person bathing (including washing, rinsing, drying)?: Total Help  from another person to put on and taking off regular upper body clothing?: Total Help from another person to put on and taking off regular lower body clothing?: Total 6 Click Score: 6   End of Session Nurse Communication: Mobility status(back pain)  Activity Tolerance: Patient limited by pain(back) Patient left: in bed;with call bell/phone within reach;with bed alarm set  OT Visit Diagnosis: Other abnormalities of gait and mobility (R26.89);Muscle weakness (generalized) (M62.81);Pain;Other symptoms and signs involving cognitive function Pain - Right/Left: Left(knee and low back)                Time: [id number] OT Time Calculation (min): 21 min Charges:  OT General Charges $OT Visit: 1 Visit OT Evaluation $OT Eval Moderate Complexity: 1 Mod  Cathy , OTR/L Acute [employer] Pager [phone] Office [phone] 07/16/2019, 4:05 PM

## 2019-07-16 NOTE — Progress Notes (Signed)
Physical Therapy Treatment Patient Details Name: Vernon Frye MRN: [id number] DOB: [birth year] Today's Date: 07/16/2019    History of Present Illness Pt adm with septic lt total knee. Underwent I&D and poly exchange of lt knee on 12/16. Pt with agitated delirium and possible withdrawal. PMH - Lt TKR 2016, htn, afib, anxiety, DDD, lt shoulder surgery    PT Comments    Pt slightly more awake and coherent today than yesterday. Remains limited by back pain and cognition. Pt denies prior back pain. Unsure of source of current back pain.    Follow Up Recommendations  SNF;Supervision/Assistance - 24 hour(unless dramatic improvement)     Equipment Recommendations  Other (comment)(To be determined)    Recommendations for Other Services       Precautions / Restrictions Precautions Precautions: Fall;Knee Required Braces or Orthoses: Knee Immobilizer - Left Knee Immobilizer - Left: Other (comment)(present in room, no specific orders) Restrictions Weight Bearing Restrictions: Yes LLE Weight Bearing: Weight bearing as tolerated    Mobility  Bed Mobility Overal bed mobility: Needs Assistance Bed Mobility: Sit to Sidelying;Supine to Sit;Rolling Rolling: +2 for physical assistance;Max assist   Supine to sit: +2 for physical assistance;Total assist;HOB elevated   Sit to sidelying: +2 for physical assistance;Total assist General bed mobility comments: Assist for all aspects  Transfers                 General transfer comment: unable to attempt safely  Ambulation/Gait                 Stairs             Wheelchair Mobility    Modified Rankin (Stroke Patients Only)       Balance Overall balance assessment: Needs assistance Sitting-balance support: Bilateral upper extremity supported;Feet supported Sitting balance-Leahy Scale: Poor Sitting balance - Comments: Sat EOB with min assist. Pt c/o back pain and wanted to lean back. With cues and encouragement  pt remained sitting 6-8 minutes Postural control: Posterior lean                                  Cognition Arousal/Alertness: Awake/alert;Lethargic Behavior During Therapy: Flat affect Overall Cognitive Status: Difficult to assess Area of Impairment: Orientation;Attention;Memory;Following commands;Safety/judgement;Awareness;Problem solving                 Orientation Level: Disoriented to;Time;Situation Current Attention Level: Sustained Memory: Decreased short-term memory;Decreased recall of precautions Following Commands: Follows one step commands with increased time;Follows one step commands inconsistently Safety/Judgement: Decreased awareness of safety;Decreased awareness of deficits Awareness: Intellectual Problem Solving: Slow processing;Decreased initiation;Difficulty sequencing;Requires verbal cues;Requires tactile cues General Comments: Pt lethargic on arrival. Did arouse to stimuli but remained groggy      Exercises Total Joint Exercises Goniometric ROM: 65 degrees passisve flexion in sitting. -5 degrees passive extension in supine    General Comments General comments (skin integrity, edema, etc.): VSS      Pertinent Vitals/Pain Pain Assessment: Faces Faces Pain Scale: Hurts even more Pain Location: lt knee and back with mobility Pain Descriptors / Indicators: Grimacing;Moaning;Guarding Pain Intervention(s): Limited activity within patient's tolerance;Monitored during session;Repositioned    Home Living                      Prior Function            PT Goals (current goals can now be found in the care plan section) Acute  Rehab PT Goals Patient Stated Goal: unable to state Progress towards PT goals: Progressing toward goals(slowly)    Frequency    Min 3X/week      PT Plan Current plan remains appropriate;Frequency needs to be updated    Co-evaluation PT/OT/SLP Co-Evaluation/Treatment: Yes Reason for Co-Treatment:  Complexity of the patient's impairments (multi-system involvement);Necessary to address cognition/behavior during functional activity;For patient/therapist safety PT goals addressed during session: Mobility/safety with mobility;Balance        AM-PAC PT "6 Clicks" Mobility   Outcome Measure  Help needed turning from your back to your side while in a flat bed without using bedrails?: Total Help needed moving from lying on your back to sitting on the side of a flat bed without using bedrails?: Total Help needed moving to and from a bed to a chair (including a wheelchair)?: Total Help needed standing up from a chair using your arms (e.g., wheelchair or bedside chair)?: Total Help needed to walk in hospital room?: Total Help needed climbing 3-5 steps with a railing? : Total 6 Click Score: 6    End of Session   Activity Tolerance: Patient limited by lethargy;Patient limited by pain Patient left: in bed;with call bell/phone within reach;with bed alarm set Nurse Communication: Mobility status;Need for lift equipment PT Visit Diagnosis: Other abnormalities of gait and mobility (R26.89);Pain Pain - Right/Left: Left Pain - part of body: Knee     Time: [id number] PT Time Calculation (min) (ACUTE ONLY): 20 min  Charges:  $Therapeutic Activity: 8-22 mins                     [place] Pager [phone] Office [place] 07/16/2019, 1:37 PM

## 2019-07-17 ENCOUNTER — Encounter: Payer: Self-pay | Admitting: *Deleted

## 2019-07-17 DIAGNOSIS — R310 Gross hematuria: Secondary | ICD-10-CM

## 2019-07-17 LAB — CULTURE, BLOOD (ROUTINE X 2)
Special Requests: ADEQUATE
Special Requests: ADEQUATE

## 2019-07-17 LAB — BASIC METABOLIC PANEL
Anion gap: 11 (ref 5–15)
BUN: 46 mg/dL — ABNORMAL HIGH (ref 6–20)
CO2: 21 mmol/L — ABNORMAL LOW (ref 22–32)
Calcium: 7.8 mg/dL — ABNORMAL LOW (ref 8.9–10.3)
Chloride: 110 mmol/L (ref 98–111)
Creatinine, Ser: 1.25 mg/dL — ABNORMAL HIGH (ref 0.61–1.24)
GFR calc Af Amer: >60 mL/min (ref >60)
GFR calc non Af Amer: >60 mL/min (ref >60)
Glucose, Bld: 186 mg/dL — ABNORMAL HIGH (ref 70–99)
Potassium: 3.5 mmol/L (ref 3.5–5.1)
Sodium: 142 mmol/L (ref 135–145)

## 2019-07-17 LAB — CBC
HCT: 37 % — ABNORMAL LOW (ref 39.0–52.0)
Hemoglobin: 12.1 g/dL — ABNORMAL LOW (ref 13.0–17.0)
MCH: 29.1 pg (ref 26.0–34.0)
MCHC: 32.7 g/dL (ref 30.0–36.0)
MCV: 88.9 fL (ref 80.0–100.0)
Platelets: 192 10*3/uL (ref 150–400)
RBC: 4.16 MIL/uL — ABNORMAL LOW (ref 4.22–5.81)
RDW: 14.9 % (ref 11.5–15.5)
WBC: 21.2 10*3/uL — ABNORMAL HIGH (ref 4.0–10.5)
nRBC: 0 % (ref 0.0–0.2)

## 2019-07-17 MED ORDER — POTASSIUM CHLORIDE 10 MEQ/100ML IV SOLN
10.0000 meq | INTRAVENOUS | Status: AC
  Administered 2019-07-17 (×4): 10 meq via INTRAVENOUS
  Filled 2019-07-17 (×4): qty 100

## 2019-07-17 MED ORDER — DILTIAZEM LOAD VIA INFUSION
20.0000 mg | Freq: Once | INTRAVENOUS | Status: AC
  Administered 2019-07-17: 12:00:00 20 mg via INTRAVENOUS
  Filled 2019-07-17: qty 20

## 2019-07-17 MED ORDER — LORAZEPAM 2 MG/ML IJ SOLN
1.0000 mg | INTRAMUSCULAR | Status: DC | PRN
  Administered 2019-07-17 – 2019-07-24 (×20): 2 mg via INTRAVENOUS
  Administered 2019-07-24: 15:00:00 1 mg via INTRAVENOUS
  Administered 2019-07-24 – 2019-08-07 (×13): 2 mg via INTRAVENOUS
  Administered 2019-08-08: 1 mg via INTRAVENOUS
  Administered 2019-08-10: 2 mg via INTRAVENOUS
  Filled 2019-07-17 (×37): qty 1

## 2019-07-17 MED ORDER — METOPROLOL SUCCINATE ER 100 MG PO TB24
200.0000 mg | ORAL_TABLET | Freq: Every day | ORAL | Status: DC
  Administered 2019-07-17 – 2019-07-26 (×10): 200 mg via ORAL
  Filled 2019-07-17 (×12): qty 2

## 2019-07-17 MED ORDER — METOPROLOL SUCCINATE ER 100 MG PO TB24
100.0000 mg | ORAL_TABLET | Freq: Every day | ORAL | Status: DC
  Filled 2019-07-17: qty 1

## 2019-07-17 MED ORDER — RESOURCE THICKENUP CLEAR PO POWD
ORAL | Status: DC | PRN
  Filled 2019-07-17: qty 125

## 2019-07-17 MED ORDER — METOPROLOL TARTRATE 5 MG/5ML IV SOLN
5.0000 mg | Freq: Once | INTRAVENOUS | Status: AC
  Administered 2019-07-17: 09:00:00 5 mg via INTRAVENOUS
  Filled 2019-07-17: qty 5

## 2019-07-17 MED ORDER — DEXMEDETOMIDINE HCL IN NACL 400 MCG/100ML IV SOLN
0.2000 ug/kg/h | INTRAVENOUS | Status: DC
  Administered 2019-07-17 (×2): 0.7 ug/kg/h via INTRAVENOUS
  Administered 2019-07-17: 14:00:00 0.2 ug/kg/h via INTRAVENOUS
  Administered 2019-07-18 – 2019-07-19 (×5): 0.7 ug/kg/h via INTRAVENOUS
  Filled 2019-07-17 (×9): qty 100

## 2019-07-17 MED ORDER — MIDAZOLAM HCL 2 MG/2ML IJ SOLN
2.0000 mg | Freq: Once | INTRAMUSCULAR | Status: AC
  Administered 2019-07-17: 13:00:00 2 mg via INTRAVENOUS
  Filled 2019-07-17: qty 2

## 2019-07-17 MED ORDER — METOPROLOL SUCCINATE ER 200 MG PO TB24
200.0000 mg | ORAL_TABLET | Freq: Every day | ORAL | Status: DC
  Filled 2019-07-17 (×3): qty 1

## 2019-07-17 MED ORDER — OXYCODONE HCL 5 MG PO TABS
5.0000 mg | ORAL_TABLET | ORAL | Status: DC | PRN
  Administered 2019-07-17 – 2019-07-26 (×4): 5 mg via ORAL
  Filled 2019-07-17 (×3): qty 1

## 2019-07-17 MED ORDER — METOPROLOL TARTRATE 5 MG/5ML IV SOLN
5.0000 mg | Freq: Once | INTRAVENOUS | Status: AC
  Administered 2019-07-17: 5 mg via INTRAVENOUS
  Filled 2019-07-17: qty 5

## 2019-07-17 MED ORDER — DILTIAZEM HCL-DEXTROSE 125-5 MG/125ML-% IV SOLN (PREMIX)
5.0000 mg/h | INTRAVENOUS | Status: DC
  Administered 2019-07-17: 8 mg/h via INTRAVENOUS
  Administered 2019-07-17 – 2019-07-18 (×2): 15 mg/h via INTRAVENOUS
  Filled 2019-07-17 (×4): qty 125

## 2019-07-17 NOTE — Progress Notes (Signed)
  Speech Language Pathology Treatment: Dysphagia  Patient Details Name: Vernon Frye MRN: [id number] DOB: [birth year] Today's Date: 07/17/2019 Time: [id number] SLP Time Calculation (min) (ACUTE ONLY): 15 min  Assessment / Plan / Recommendation Clinical Impression  Pt demonstrates improved arousal and strength today, but is intense pain and cannot find relief despite RN and SLP assist and repositioning. Intermittently tachy and increased RR with pain. He is able to seal lips to straw today and take consecutive sips of nectar thick liquids without difficulty. When given small cup sips of water coughing persists. Pt would not accept any solids. Recommend pt continue nectar thick and puree, will likely improve when attention has improved and pain is controlled. May need MBS when he can tolerate procedure, but expect gradual spontaneous recovery. Will f/u at bedside for now.   HPI HPI: 58 year old man who is critically ill due to a severe agitated delirium due to toxic metabolic encephalopathy requiring titration of Precedex infusion to maintain patient safety. MRSA bacteremia secondary to septic right joint now status post washout. Overnight pt had issues with agitation and was on BiPAP.       SLP Plan  Continue with current plan of care       Recommendations  Diet recommendations: Nectar-thick liquid;Dysphagia 1 (puree) Liquids provided via: Cup;Straw Medication Administration: Whole meds with puree Supervision: Staff to assist with self feeding;Full supervision/cueing for compensatory strategies Compensations: Slow rate;Small sips/bites                Follow up Recommendations: 24 hour supervision/assistance SLP Visit Diagnosis: Dysphagia, unspecified (R13.10) Plan: Continue with current plan of care       GO               Herbie [place], MA [place] Pager [phone] Office [phone]  Lynann Beaver 07/17/2019, 9:25  AM

## 2019-07-17 NOTE — Progress Notes (Signed)
Spoke with RN re PICC order.  Still awaiting blood culture results.  Currently has sufficient IV access with midline and PIV in place, adequate for meds currently being administered.  RN to assess need for PICC placement vs a Single lumen PICC rather than a Double lumen as ordered.  Recommend continuation of midline for current meds and revisit a PICC if loses midline access or IV needs increase.

## 2019-07-17 NOTE — Progress Notes (Signed)
Pt has been having a lot of back pain since I arrived this morning. CCM notified and increased frequency on when pain medication can be given. After pain medication given no relief. HR also staying between 120s-160s afib, RR 30s, BP elevated as well 150-160/ 90-100. Pt then started to have blood in urine. CCM at bedside new orders for metoprolol IV push placed.

## 2019-07-17 NOTE — Progress Notes (Addendum)
NAME:  Vernon Frye, MRN:  [id number], DOB:  [birth year], LOS: 4 ADMISSION DATE:  07/12/2019, CONSULTATION DATE:  07/13/19 REFERRING MD:  [place] [facility], CHIEF COMPLAINT:  L knee pain  Brief History   58 yo M presents with L knee pair, prior L TKR   History of present illness   58 yo M PMH Afib, HTN, L TKR (2016) who presented to ED with CC L knee pain. Pain began 12/14 and has progressed prompting presentation. 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 Desaturated with hypoxemic respiratory failure requiring 3LNC.  PCCM consulted for admission.  Went for knee washout and abx spacer placement on 12/16.   Past Medical History  A fib HTN Anxiety L TKR  L shoulder arthroscopy  DDD  Significant Hospital Events   12/16 presents to ED for L knee pain. Concern for septic joint. A fiv RVR. Developed SOB after vanc administration. PCCM consulted for admission    Consults:  Ortho   Procedures:  12/16 left knee Irrigation and debridement, total knee with poly exchange    Significant Diagnostic Tests:  12/16 ECHO >  12/16 CXR > no effusion, no pneumothorax, possible mild edema  12/15 L knee XR > prior L knee replacement. Moderate joint effusion, no body abnormality  12/16 Renal ultrasound > Mild left-sided hydronephrosis is noted. Micro Data:  12/16 SARS Cov2 >negative  12/16 BCx > positive Staph MRSA  12/16 UCx > 12/16 Strep A > Negative  12/16 Flu A/B > neg 12/16 MRSA PCR > positive  12/16 Body culture left knee > Abundant gram positive cocci   Antimicrobials:  12/16 vanc x1 12/16 ceftriaxone x1 12/16 Zosyn > 12/16 Daptomycin 12/17 >  Interim history/subjective:  Afebrile overnight. Off continuous sedation. This morning went into AF with RVR. Also hypertensive and having back pain. Had some hematuria this morning after switching from heparin to lovenox.   Objective   Blood pressure (!) 158/85,  pulse (!) 113, temperature 97.8 F (36.6 C), temperature source Oral, resp. rate (!) 32, height 6\' 3"  (1.905 m), weight 120.8 kg, SpO2 95 %.        Intake/Output Summary (Last 24 hours) at 07/17/2019 0934 Last data filed at 07/17/2019 0400 Gross per 24 hour  Intake 1254.59 ml  Output 3100 ml  Net -1845.41 ml   Filed Weights   07/14/19 0344 07/15/19 0436 07/16/19 0500  Weight: 122.4 kg 120.9 kg 120.8 kg    Examination: General: Adult male lying in bed, in NAD, awake but agitated HEENT: [place]/AT, MM pink/moist, PERRL,  Neuro: awake alert, answers questions CV: irregularly irregular, HR in 70s, no mrg PULM:  Clear to ascultation bilaterally, no added breath sounds GI: soft, bowel sounds active in all 4 quadrants, non-tender, non-distended Extremities: warm/dry, 2+ pitting lower extremity edema  Skin: no rashes or lesions  Resolved Hospital Problem list     Assessment & Plan:   MRSA Bacteremia  -In the setting of septic joint  -MRSA PCR positive  - with severe sepsis P ID following, appreciate assistance Continue Daptomycin  TTE was done and final report is still pending.  He will need a TEE.  Cultures have now cleared.  PICC line for IV abx is planned  Left Knee Septic joint -suspect septic joint in setting of L TKR in 2016 -Underwent I&D with partial hardwear exchange 12/16 P Left knee culture results with abundant gram positive cocci  Ortho  following, appreciate assistance  IV antibiotics as above   Acute encephalopathy secondary to agitated delirium - multifactorial could also be from polysubstance abuse or etoh withdrawal.  P - start seroquel scheduled - prn haldol - will obtain repeat EKG for QTc - monitor for etoh withdrawal signs.  - hold benzos for now as they worsen delirium.   Acute Respiratory Insufficiency with hypoxia -following fluid bolus in ED, crackles on exam, most suspicious for pulm edema -Covid neg , Flu A/B neg  -low suspicion for  PE  Sore throat, tonsillar exudate  -Strep A negative  P SLP following to assist in advancement of diet  Continue supplemental oxygen for sat goal > 92% Encourage frequent and adequate pulmonary hygiene   AKI -Hx kidney stones  -Hx urinary retention  -Renal ultrasound with mild left-sided hydronephrosis P Follow renal function / urine output Trend Bmet Avoid nephrotoxins, ensure adequate renal perfusion  IV hydration  Afib RVR -non-compliant w eliquis outpatient P Worsening this morning - resumed home metoprolol this morning Will give additional pushes if IV medication as needed.  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 Now on lovenox. Will monitor for clinical worsening or worsening anemia.  - continue AC for now.   Best practice:  Diet: advancing diet po per speech Pain/Anxiety/Delirium protocol (if indicated): na VAP protocol (if indicated): na DVT prophylaxis: heparin  GI prophylaxis: pepcid  Glucose control: monitor Mobility: will change to OOB with PT Code Status: Full  Family Communication: Patient updated  Disposition: ICU  The patient is critically ill with multiple organ systems failure and requires high complexity decision making for assessment and support, frequent evaluation and titration of therapies, application of advanced monitoring technologies and extensive interpretation of multiple databases.   Critical Care Time devoted to patient care services described in this note is 44 minutes. This  time reflects time of care of this [place] . This critical care time does not reflect separately billable procedures or procedure time, teaching time or supervisory time of PA/NP/Med student/Med Resident etc but could involve care discussion time.  [facility] 07/17/2019 9:34 AM  Pager: [phone] After hours pager: [phone]    Labs   CBC: Recent Labs  Lab 07/12/19 1910 07/13/19 0431 07/13/19 2357 07/14/19 0600 07/14/19 2357 07/16/19 0344 07/17/19 0503  WBC 16.0* 12.9*  --  16.4* 19.9* 19.0* 21.2*  NEUTROABS 13.5*  --   --   --   --   --   --   HGB 13.3 11.5* 11.6* 11.1* 10.8* 11.0* 12.1*  HCT 40.3 34.8* 34.0* 32.5* 32.2* 32.8* 37.0*  MCV 88.6 88.1  --  86.4 87.5 88.6 88.9  PLT 227 164  --  162 197 127* [id number]    Basic Metabolic Panel: Recent Labs  Lab 07/12/19 1910 07/13/19 0431 07/13/19 2357 07/14/19 0600  NA 130* 130* 130* 132*  K 3.6 3.8 3.9 4.4  CL 91* 95*  --  98  CO2  22 21*  --  19*  GLUCOSE 122* 132*  --  174*  BUN 43* 44*  --  55*  CREATININE 3.36* 3.08*  --  2.42*  CALCIUM 8.7* 8.1*  --  7.6*  MG  --  1.0*  --  2.1  PHOS  --  3.1  --  3.5   GFR: Estimated Creatinine Clearance: 46.6 mL/min (A) (by C-G formula based on SCr of 2.42 mg/dL (H)). Recent Labs  Lab 07/12/19 1910 07/13/19 0119 07/13/19 0335 07/13/19 0431 07/13/19 0513 07/14/19 0600 07/14/19 2357 07/16/19 0344 07/17/19 0503  PROCALCITON  --   --   --  8.28  --   --   --   --   --   WBC 16.0*  --   --  12.9*  --  16.4* 19.9* 19.0* 21.2*  LATICACIDVEN 4.2* 3.6* 3.3*  --  2.4*  --   --   --   --     Liver Function Tests: Recent Labs  Lab 07/12/19 1910 07/13/19 0431  AST 29 24  ALT 17 14  ALKPHOS 94 68  BILITOT 1.9* 1.6*  PROT 6.9 5.3*  ALBUMIN 2.5* 1.9*   No results for input(s): LIPASE, AMYLASE in the last 168 hours. No results for input(s): AMMONIA in the last 168 hours.  ABG    Component Value Date/Time    PHART 7.355 07/13/2019 2357   PCO2ART 35.9 07/13/2019 2357   PO2ART 72.0 (L) 07/13/2019 2357   HCO3 20.2 07/13/2019 2357   TCO2 21 (L) 07/13/2019 2357   ACIDBASEDEF 5.0 (H) 07/13/2019 2357   O2SAT 94.0 07/13/2019 2357     Coagulation Profile: Recent Labs  Lab 07/13/19 0117  INR 1.5*    Cardiac Enzymes: Recent Labs  Lab 07/14/19 0600  CKTOTAL 126    HbA1C: Hgb A1c MFr Bld  Date/Time Value Ref Range Status  07/13/2019 04:08 PM 6.3 (H) 4.8 - 5.6 % Final    Comment:    (NOTE) Pre diabetes:          5.7%-6.4% Diabetes:              >6.4% Glycemic control for   <7.0% adults with diabetes     CBG: Recent Labs  Lab 07/13/19 1058 07/13/19 2309 07/14/19 1059  GLUCAP 79 141* 164*

## 2019-07-17 NOTE — Progress Notes (Signed)
Progress Note  Patient Name: Vernon Frye Date of Encounter: 07/17/2019  Primary Cardiologist: Sinclair Grooms, MD  Subjective   Reports siginficant back pain today  Inpatient Medications    Scheduled Meds: . acetaminophen  650 mg Oral Q8H  . Chlorhexidine Gluconate Cloth  6 each Topical Daily  . docusate sodium  100 mg Oral BID  . enoxaparin (LOVENOX) injection  120 mg Subcutaneous Q12H  . folic acid  1 mg Oral Daily  . metoprolol succinate  200 mg Oral Daily  . multivitamin with minerals  1 tablet Oral Daily  . QUEtiapine  25 mg Oral BID  . thiamine  100 mg Oral Daily   Or  . thiamine  100 mg Intravenous Daily   Continuous Infusions: . sodium chloride 50 mL/hr at 07/16/19 1900  . DAPTOmycin (CUBICIN)  IV 1,000 mg (07/16/19 2031)  . diltiazem (CARDIZEM) infusion 8 mg/hr (07/17/19 1140)   PRN Meds: diphenhydrAMINE, haloperidol lactate, menthol-cetylpyridinium **OR** phenol, ondansetron **OR** ondansetron (ZOFRAN) IV, oxyCODONE, Resource ThickenUp Clear, sodium chloride flush   Vital Signs    Vitals:   07/17/19 1055 07/17/19 1100 07/17/19 1105 07/17/19 1107  BP: (!) 157/107 (!) 164/112    Pulse:   (!) 107 (!) 138  Resp: (!) 35 (!) 38 (!) 27   Temp:   99.6 F (37.6 C)   TempSrc:   Axillary   SpO2:   100%   Weight:      Height:        Intake/Output Summary (Last 24 hours) at 07/17/2019 1211 Last data filed at 07/17/2019 0400 Gross per 24 hour  Intake 1009 ml  Output 2650 ml  Net -1641 ml   Last 3 Weights 07/16/2019 07/15/2019 07/14/2019  Weight (lbs) 266 lb 5.1 oz 266 lb 8.6 oz 269 lb 13.5 oz  Weight (kg) 120.8 kg 120.9 kg 122.4 kg      Telemetry     Atrial fibrillation with rates 120-130s - Personally Reviewed  ECG    07/16/19, afib at 75 bpm - Personally Reviewed  Physical Exam   GEN: No acute distress.   Neck: No JVD Cardiac: irregularly irregular, no murmurs, rubs, or gallops.  Respiratory: Clear to auscultation bilaterally. GI:  Soft, nontender, non-distended  MS: significant LE edema. Left knee wrapped. Neuro:  Nonfocal  Psych: Normal affect   Labs    High Sensitivity Troponin:  No results for input(s): TROPONINIHS in the last 720 hours.    Chemistry Recent Labs  Lab 07/12/19 1910 07/13/19 0431 07/13/19 2357 07/14/19 0600  NA 130* 130* 130* 132*  K 3.6 3.8 3.9 4.4  CL 91* 95*  --  98  CO2 22 21*  --  19*  GLUCOSE 122* 132*  --  174*  BUN 43* 44*  --  55*  CREATININE 3.36* 3.08*  --  2.42*  CALCIUM 8.7* 8.1*  --  7.6*  PROT 6.9 5.3*  --   --   ALBUMIN 2.5* 1.9*  --   --   AST 29 24  --   --   ALT 17 14  --   --   ALKPHOS 94 68  --   --   BILITOT 1.9* 1.6*  --   --   GFRNONAA 19* 21*  --  28*  GFRAA 22* 25*  --  33*  ANIONGAP 17* 14  --  15     Hematology Recent Labs  Lab 07/14/19 2357 07/16/19 0344 07/17/19 0503  WBC 19.9*  19.0* 21.2*  RBC 3.68* 3.70* 4.16*  HGB 10.8* 11.0* 12.1*  HCT 32.2* 32.8* 37.0*  MCV 87.5 88.6 88.9  MCH 29.3 29.7 29.1  MCHC 33.5 33.5 32.7  RDW 14.7 15.3 14.9  PLT 197 127* 192    BNP Recent Labs  Lab 07/13/19 0431  BNP 299.7*     DDimer  Recent Labs  Lab 07/13/19 1608  DDIMER 3.78*     Radiology    ECHOCARDIOGRAM COMPLETE  Result Date: 07/16/2019   ECHOCARDIOGRAM REPORT   Patient Name:   Vernon Frye Date of Exam: 07/13/2019 Medical Rec #:  [id number]       Height:       75.0 in Accession #:    [id number]      Weight:       260.0 lb Date of Birth:  1961/04/16      BSA:          2.45 m Patient Age:    58 years        BP:           101/68 mmHg Patient Gender: M               HR:           124 bpm. Exam Location:  Inpatient Procedure: 2D Echo STAT ECHO Indications:     Atrial Fibrillation 427.31/ I48.91  History:         Patient has no prior history of Echocardiogram examinations.                  Risk Factors:Hypertension. Left knee sepitc joint, acute kidney                  injury, allergic reaction to antibiotic, COVID-19 test                   pending,.  Sonographer:     Darlina Sicilian RDCS Referring Phys:  [place] Diagnosing Phys: Eleonore Chiquito MD  Sonographer Comments: Patient condition. IMPRESSIONS  1. Left ventricular ejection fraction, by visual estimation, is 55 to 60%. The left ventricle has normal function. There is mildly increased left ventricular hypertrophy.  2. Left ventricular diastolic function could not be evaluated.  3. Right ventricular volume/pressure overload.  4. The left ventricle demonstrates regional wall motion abnormalities.  5. Global right ventricle has severely reduced systolic function.The right ventricular size is severely enlarged. No increase in right ventricular wall thickness.  6. Left atrial size was normal.  7. Right atrial size was mild-moderately dilated.  8. Presence of pericardial fat pad.  9. Trivial pericardial effusion is present. 10. Mild mitral annular calcification. 11. The mitral valve is degenerative. Trivial mitral valve regurgitation. 12. The tricuspid valve is grossly normal. Tricuspid valve regurgitation is trivial. 13. The aortic valve is tricuspid. Aortic valve regurgitation is not visualized. No evidence of aortic valve sclerosis or stenosis. 14. The pulmonic valve was grossly normal. Pulmonic valve regurgitation is not visualized. 15. TR signal is inadequate for assessing pulmonary artery systolic pressure. 16. The inferior vena cava is normal in size with greater than 50% respiratory variability, suggesting right atrial pressure of 3 mmHg. 17. No prior Echocardiogram. FINDINGS  Left Ventricle: Left ventricular ejection fraction, by visual estimation, is 55 to 60%. The left ventricle has normal function. The left ventricle demonstrates regional wall motion abnormalities. The left ventricular internal cavity size was the left ventricle is normal in size. There is mildly increased left  ventricular hypertrophy. Abnormal (paradoxical) septal motion, consistent with right ventricular volume  overload and/or elevated RV end-diastolic pressure. The left ventricular diastology could not be evaluated due to atrial fibrillation. Left ventricular diastolic function could not be evaluated. Right Ventricle: The right ventricular size is severely enlarged. No increase in right ventricular wall thickness. Global RV systolic function is has severely reduced systolic function. Left Atrium: Left atrial size was normal in size. Right Atrium: Right atrial size was mild-moderately dilated Pericardium: Trivial pericardial effusion is present. Presence of pericardial fat pad. Mitral Valve: The mitral valve is degenerative in appearance. Mild mitral annular calcification. Trivial mitral valve regurgitation. Tricuspid Valve: The tricuspid valve is grossly normal. Tricuspid valve regurgitation is trivial. Aortic Valve: The aortic valve is tricuspid. Aortic valve regurgitation is not visualized. The aortic valve is structurally normal, with no evidence of sclerosis or stenosis. Pulmonic Valve: The pulmonic valve was grossly normal. Pulmonic valve regurgitation is not visualized. Pulmonic regurgitation is not visualized. Aorta: The aortic root and ascending aorta are structurally normal, with no evidence of dilitation. Venous: The inferior vena cava is normal in size with greater than 50% respiratory variability, suggesting right atrial pressure of 3 mmHg. IAS/Shunts: No atrial level shunt detected by color flow Doppler.  LEFT VENTRICLE PLAX 2D LVIDd:         4.20 cm LVIDs:         2.90 cm LV PW:         1.50 cm LV IVS:        1.40 cm LVOT diam:     1.90 cm LV SV:         46 ml LV SV Index:   18.34 LVOT Area:     2.84 cm  LEFT ATRIUM            Index LA diam:      4.00 cm  1.63 cm/m LA Vol (A4C): 104.0 ml 42.39 ml/m   AORTA Ao Root diam: 2.70 cm MITRAL VALVE MV Area (PHT): 5.34 cm            SHUNTS MV PHT:        41.18 msec          Systemic Diam: 1.90 cm MV Decel Time: 142 msec MV E velocity: 88.83 cm/s 103 cm/s  Eleonore Chiquito MD Electronically signed by Eleonore Chiquito MD Signature Date/Time: 07/13/2019/8:36:27 AM    Final (Updating)    [place] EKG SITE RITE  Result Date: 07/16/2019 If Site Rite image not attached, placement could not be confirmed due to current cardiac rhythm.   Cardiac Studies   TTE 07/13/19:  1. Left ventricular ejection fraction, by visual estimation, is 55 to 60%. The left ventricle has normal function. There is mildly increased left ventricular hypertrophy.  2. Left ventricular diastolic function could not be evaluated.  3. Right ventricular volume/pressure overload.  4. The left ventricle demonstrates regional wall motion abnormalities.  5. Global right ventricle has severely reduced systolic function.The right ventricular size is severely enlarged. No increase in right ventricular wall thickness.  6. Left atrial size was normal.  7. Right atrial size was mild-moderately dilated.  8. Presence of pericardial fat pad.  9. Trivial pericardial effusion is present. 10. Mild mitral annular calcification. 11. The mitral valve is degenerative. Trivial mitral valve regurgitation. 12. The tricuspid valve is grossly normal. Tricuspid valve regurgitation is trivial. 13. The aortic valve is tricuspid. Aortic valve regurgitation is not visualized. No evidence of aortic valve sclerosis or  stenosis. 14. The pulmonic valve was grossly normal. Pulmonic valve regurgitation is not visualized. 15. TR signal is inadequate for assessing pulmonary artery systolic pressure. 16. The inferior vena cava is normal in size with greater than 50% respiratory variability, suggesting right atrial pressure of 3 mmHg. 17. No prior Echocardiogram.  Patient Profile     58 y.o. male with recent septic left knee (prosthetic joint infection), MRSA bacteremia. We are following due to post op atrial fibrillation with RVR  Assessment & Plan    Atrial fibrillation with RVR: has been in rate-controlled AF, but this morning  went into RVR, rates 120-130s.  Suspect RVR may be driven by pain, as reporting significant back pain.  Toprol XL 200 mg and IV lopressor 5 mg given without response, was then given diltiazem bolus and started on dilt gtt -on therapeutic lovenox  for anticoagulation -treatment of pain per primary team -can continue dilt gtt for rate control (normal systolic function on recent echo) -discussed with patient that we could attempt TEE/DCCV to restore sinus rhythm, particularly as needs TEE anyways to rule out endocarditis in setting of MRSA bacteremia.  However, he refuses any procedures at this time  Sepsis, MRSA bacteremia: s/p irrigation and debridement, antibiotic spacer placement -ID following, on daptomycin -TTE shows no vegetation -TEE prior to discharge. He is currently refusing.    For questions or updates, please contact [place] Please consult [email or web address] for contact info under     Signed, Donato Heinz, MD  07/17/2019, 12:11 PM

## 2019-07-17 NOTE — Progress Notes (Signed)
Patient ID: Vernon Frye, male   DOB: 09-Jan-1961, 58 y.o.   MRN: [id number] Subjective: 4 Days Post-Op Procedure(s) (LRB): IRRIGATION AND DEBRIDEMENT TOTAL  KNEE WITH POLY EXCHANGE (Left)    Patient complains predominantly of back pain this am.  Still acutely ill Failed attempts with therapy to get up and out of bed related to weakness  Objective:   VITALS:   Vitals:   07/17/19 0432 07/17/19 0709  BP:    Pulse:    Resp:    Temp: 98.4 F (36.9 C) 97.8 F (36.6 C)  SpO2:      Exam On nasal canula O2 Flushed face Left knee exam - ACE wrap removed, aqualcell dressing dry Prepatellar swelling, post op related to heparin, post op bleeding Left knee warm but same as right  decreased erythema  LABS Recent Labs    07/14/19 2357 07/16/19 0344 07/17/19 0503  HGB 10.8* 11.0* 12.1*  HCT 32.2* 32.8* 37.0*  WBC 19.9* 19.0* 21.2*  PLT 197 127* 192    No results for input(s): NA, K, BUN, CREATININE, GLUCOSE in the last 72 hours.  No results for input(s): LABPT, INR in the last 72 hours.   Assessment/Plan: 4 Days Post-Op Procedure(s) (LRB): IRRIGATION AND DEBRIDEMENT TOTAL  KNEE WITH POLY EXCHANGE (Left)  Still acutely ill but stable Left knee appears stable at this point Continue IV Daptomycin per ID Work on mobility to help with positional related back pain

## 2019-07-18 ENCOUNTER — Inpatient Hospital Stay (HOSPITAL_COMMUNITY): Payer: Self-pay

## 2019-07-18 DIAGNOSIS — Z95828 Presence of other vascular implants and grafts: Secondary | ICD-10-CM

## 2019-07-18 DIAGNOSIS — F10231 Alcohol dependence with withdrawal delirium: Secondary | ICD-10-CM

## 2019-07-18 DIAGNOSIS — M545 Low back pain, unspecified: Secondary | ICD-10-CM

## 2019-07-18 DIAGNOSIS — Z96652 Presence of left artificial knee joint: Secondary | ICD-10-CM

## 2019-07-18 DIAGNOSIS — R410 Disorientation, unspecified: Secondary | ICD-10-CM

## 2019-07-18 LAB — CBC
HCT: 32.6 % — ABNORMAL LOW (ref 39.0–52.0)
Hemoglobin: 11.1 g/dL — ABNORMAL LOW (ref 13.0–17.0)
MCH: 29.2 pg (ref 26.0–34.0)
MCHC: 34 g/dL (ref 30.0–36.0)
MCV: 85.8 fL (ref 80.0–100.0)
Platelets: 176 10*3/uL (ref 150–400)
RBC: 3.8 MIL/uL — ABNORMAL LOW (ref 4.22–5.81)
RDW: 14.6 % (ref 11.5–15.5)
WBC: 19.1 10*3/uL — ABNORMAL HIGH (ref 4.0–10.5)
nRBC: 0 % (ref 0.0–0.2)

## 2019-07-18 LAB — HEPARIN ANTI-XA: Heparin LMW: 0.68 IU/mL

## 2019-07-18 LAB — BASIC METABOLIC PANEL
Anion gap: 12 (ref 5–15)
BUN: 38 mg/dL — ABNORMAL HIGH (ref 6–20)
CO2: 22 mmol/L (ref 22–32)
Calcium: 7.9 mg/dL — ABNORMAL LOW (ref 8.9–10.3)
Chloride: 109 mmol/L (ref 98–111)
Creatinine, Ser: 1 mg/dL (ref 0.61–1.24)
GFR calc Af Amer: >60 mL/min (ref >60)
GFR calc non Af Amer: >60 mL/min (ref >60)
Glucose, Bld: 167 mg/dL — ABNORMAL HIGH (ref 70–99)
Potassium: 3.9 mmol/L (ref 3.5–5.1)
Sodium: 143 mmol/L (ref 135–145)

## 2019-07-18 LAB — CULTURE, BLOOD (ROUTINE X 2): Special Requests: ADEQUATE

## 2019-07-18 MED ORDER — GADOBUTROL 1 MMOL/ML IV SOLN
10.0000 mL | Freq: Once | INTRAVENOUS | Status: AC | PRN
  Administered 2019-07-18: 10 mL via INTRAVENOUS

## 2019-07-18 MED ORDER — METOPROLOL TARTRATE 5 MG/5ML IV SOLN
5.0000 mg | INTRAVENOUS | Status: DC | PRN
  Administered 2019-07-19 – 2019-07-22 (×5): 5 mg via INTRAVENOUS
  Filled 2019-07-18 (×5): qty 5

## 2019-07-18 NOTE — Progress Notes (Signed)
[place] for heparin Indication: atrial fibrillation  Allergies  Allergen Reactions  . Vancomycin Shortness Of Breath and Rash    [name]     Patient Measurements: Height: 6\' 3"  (190.5 cm) Weight: 250 lb 14.1 oz (113.8 kg) IBW/kg (Calculated) : 84.5  Vital Signs: Temp: 98.3 F (36.8 C) (12/21 1530) Temp Source: Oral (12/21 1530) BP: 136/83 (12/21 1600) Pulse Rate: 89 (12/21 1600)  Labs: Recent Labs    07/15/19 1928 07/16/19 0344 07/17/19 0503 07/17/19 1539 07/18/19 0233 07/18/19 1451  HGB  --  11.0* 12.1*  --  11.1*  --   HCT  --  32.8* 37.0*  --  32.6*  --   PLT  --  127* 192  --  176  --   HEPARINUNFRC <0.10*  --   --   --   --   --   HEPRLOWMOCWT  --   --   --   --   --  0.68  CREATININE  --   --   --  1.25* 1.00  --     Estimated Creatinine Clearance: 109.6 mL/min (by C-G formula based on SCr of 1 mg/dL).  Assessment: 58 y.o. male with a history of atrial fibrillation who presented 07/13/19 with L knee pain and swelling and is now status post irrigation and debridement 12/16 PM. Pharmacy was consulted for IV heparin dosing. Heparin was held for procedure and resumed on AM of 12/19. Note patient was not taking Eliquis prior to admission due to cost.   Due to difficulty with sticks for heparin levels and persistently low heparin levels, pt was transitioned to therapeutic Lovenox on 12/19.  Pt has been receiving Lovenox 120 mg SQ Q 12 hrs since 12/19 AM. Steady-state LMWH level drawn today ~3 hrs after last dose of Lovenox was 0.68 units/ml. Although the level was drawn a little early, LMWH level anticipated to be therapeutic at ~4 hrs post dose, and pt not accumulating Lovenox.  H/H 11.1/32.6, platelets 176 (CBC stable); Scr 1.0, TBW CrCl >100 ml/min. Per RN, no bleeding observed.    Goal of Therapy:  Anti-Xa level 0.6-1 units/ml 4hrs after LMWH dose given Monitor platelets by anticoagulation protocol: Yes    Plan:  Continue Lovenox 120 mg SQ every 12 hours Monitor renal function, CBC, signs/symptoms of bleeding  Thank you for allowing pharmacy to be a part of this patient's care.  Gillermina Hu, PharmD, BCPS, [facility] Clinical Pharmacist 07/18/2019 4:46 PM   .

## 2019-07-18 NOTE — Progress Notes (Signed)
NAME:  Vernon Frye, MRN:  [id number], DOB:  22-Jul-1961, LOS: 5 ADMISSION DATE:  07/12/2019, CONSULTATION DATE:  07/13/19 REFERRING MD:  [place], CHIEF COMPLAINT:  L knee pain  Brief History   58 yo M presents with L knee pair, prior L TKR   History of present illness   58 yo M PMH Afib, HTN, L TKR (2016) who presented to ED with CC L knee pain. Pain began 12/14 and has progressed prompting presentation. 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 Desaturated with hypoxemic respiratory failure requiring 3LNC.  PCCM consulted for admission.  Went for knee washout and abx spacer placement on 12/16.   Past Medical History  A fib HTN Anxiety L TKR  L shoulder arthroscopy  DDD  Significant Hospital Events   12/16 presents to ED for L knee pain. Concern for septic joint. A fiv RVR. Developed SOB after vanc administration. PCCM consulted for admission  Consults:  Ortho   Procedures:  12/16 left knee Irrigation and debridement, total knee with poly exchange    Significant Diagnostic Tests:  12/16 ECHO >  12/16 CXR > no effusion, no pneumothorax, possible mild edema  12/15 L knee XR > prior L knee replacement. Moderate joint effusion, no body abnormality  12/16 Renal ultrasound > Mild left-sided hydronephrosis is noted. Micro Data:  12/16 SARS Cov2 >negative  12/16 BCx > positive Staph MRSA  12/16 UCx > 12/16 Strep A > Negative  12/16 Flu A/B > neg 12/16 MRSA PCR > positive  12/16 Body culture left knee > Abundant gram positive cocci   Antimicrobials:  12/16 vanc x1 12/16 ceftriaxone x1 12/16 Zosyn > 12/16 Daptomycin 12/17 >  Interim history/subjective:  Started on precedex late yesterday afternoon and this morning looks more much comfortable, tachypnea also improved.   Objective   Blood pressure 116/82, pulse 83, temperature 98.9 F (37.2 C), temperature source Axillary, resp. rate (!)  34, height 6\' 3"  (1.905 m), weight 113.8 kg, SpO2 97 %.        Intake/Output Summary (Last 24 hours) at 07/18/2019 0827 Last data filed at 07/18/2019 0700 Gross per 24 hour  Intake 1430.89 ml  Output 3570 ml  Net -2139.11 ml   Filed Weights   07/15/19 0436 07/16/19 0500 07/18/19 0449  Weight: 120.9 kg 120.8 kg 113.8 kg    Examination: General: Adult male lying in bed, in NAD, resting comfortably HEENT: [place]/AT, MM pink/moist, PERRL,  Neuro: awake alert, answers questions CV: irregularly irregular, HR in 80s, no mrg PULM:  Clear to ascultation bilaterally, no added breath sounds GI: soft, bowel sounds active in all 4 quadrants, non-tender, non-distended Extremities: warm/dry, 2+ pitting lower extremity edema  Skin: no rashes or lesions  Resolved Hospital Problem list     Assessment & Plan:  Acute encephalopathy secondary to agitated delirium - multifactorial could also be from polysubstance abuse or etoh withdrawal.  P - continue seroquel scheduled - continue precedex for etoh withdrawal with prn ativan per ciwa - also has prn haldol available.   MRSA Bacteremia  -In the setting of septic joint  -MRSA PCR positive  - with severe sepsis P ID following, appreciate assistance Continue Daptomycin - had redman's syndrome with vanco TTE negative for vegetation. He will need a TEE. Cultures have now cleared.  PICC line for IV abx is planned eventually.   Afib RVR -non-compliant w eliquis outpatient P Been on diltiazem  gtt, resumed home metoprolol Will attempt to titrate diltiazem today, give prn metoprolol for HR>110 Continue lovenox  Left Knee Septic joint -suspect septic joint in setting of L TKR in 2016 -Underwent I&D with partial hardwear exchange 12/16 P Left knee culture results with abundant gram positive cocci  Ortho following, appreciate assistance  IV antibiotics as above   AKI -Hx kidney stones  -Hx urinary retention  -Renal ultrasound with mild  left-sided hydronephrosis P Follow renal function / urine output Trend Bmet Avoid nephrotoxins, ensure adequate renal perfusion  IV hydration  Acute Respiratory Insufficiency with hypoxia -following fluid bolus in ED, crackles on exam, most suspicious for pulm edema -Covid neg , Flu A/B neg  -low suspicion for PE - on miminal nasal cannula now, has been titrated down.  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 Substance Use  -denies substance use; UDS pos for Amphetamines, BZD, Opioids (Home opioids for pain, BZD for anxiety)  P Continue Vitamin B supplementation  On seroquel and haldol for now Monitor for etoh withdrawal  Hematuria - was on high doses of heparin due to not being able to check therapeutic levels without IV access. Now on lovenox.  P - continue AC for now.  -Will monitor for clinical worsening or worsening anemia.   Best practice:  Diet: advancing diet po per speech. Taking pills Pain/Anxiety/Delirium protocol (if indicated): na VAP protocol (if indicated): na DVT prophylaxis: heparin  GI prophylaxis: pepcid  Glucose control: monitor Mobility: will change to OOB with PT Code Status: Full  Family Communication: Patient updated. He has a friend who occasional calls in for updates. Not been able to reach any family.  Disposition: ICU  The patient is critically ill with multiple organ systems failure and requires high complexity decision making for assessment and support, frequent evaluation and titration of therapies, application of advanced monitoring technologies and extensive interpretation of multiple databases.   Critical Care Time devoted to patient care services described in this note is 36 minutes. This time reflects time of care of this [place] . This critical care time does not reflect separately billable procedures or procedure time, teaching time or supervisory time of PA/NP/Med student/Med Resident etc but could involve care discussion  time.  [facility] 07/18/2019 8:27 AM  Pager: [phone] After hours pager: [phone]    Labs   CBC: Recent Labs  Lab 07/12/19 1910 07/14/19 0600 07/14/19 2357 07/16/19 0344 07/17/19 0503 07/18/19 0233  WBC 16.0* 16.4* 19.9* 19.0* 21.2* 19.1*  NEUTROABS 13.5*  --   --   --   --   --   HGB 13.3 11.1* 10.8* 11.0* 12.1* 11.1*  HCT 40.3 32.5* 32.2* 32.8* 37.0* 32.6*  MCV 88.6 86.4 87.5 88.6 88.9 85.8  PLT 227 162 197 127* 192 0000000    Basic Metabolic Panel: Recent Labs  Lab 07/12/19 1910 07/13/19 0431 07/13/19 2357 07/14/19 0600 07/17/19 1539 07/18/19 0233  NA 130* 130* 130* 132* 142 143  K 3.6 3.8 3.9 4.4 3.5 3.9  CL 91* 95*  --  98 110 109  CO2 22 21*  --  19* 21* 22  GLUCOSE 122* 132*  --  174* 186* 167*  BUN 43* 44*  --  55* 46* 38*  CREATININE 3.36* 3.08*  --  2.42* 1.25* 1.00  CALCIUM 8.7* 8.1*  --  7.6* 7.8* 7.9*  MG  --  1.0*  --  2.1  --   --  PHOS  --  3.1  --  3.5  --   --    GFR: Estimated Creatinine Clearance: 109.6 mL/min (by C-G formula based on SCr of 1 mg/dL). Recent Labs  Lab 07/12/19 1910 07/13/19 0119 07/13/19 0335 07/13/19 0431 07/13/19 0513 07/14/19 2357 07/16/19 0344 07/17/19 0503 07/18/19 0233  PROCALCITON  --   --   --  8.28  --   --   --   --   --   WBC 16.0*  --   --  12.9*  --  19.9* 19.0* 21.2* 19.1*  LATICACIDVEN 4.2* 3.6* 3.3*  --  2.4*  --   --   --   --     Liver Function Tests: Recent Labs  Lab 07/12/19 1910 07/13/19 0431  AST 29 24  ALT 17 14  ALKPHOS 94 68  BILITOT 1.9* 1.6*  PROT 6.9 5.3*  ALBUMIN 2.5* 1.9*   No results for input(s): LIPASE, AMYLASE in the last 168 hours. No results for input(s): AMMONIA in the last 168 hours.  ABG    Component Value Date/Time   PHART 7.355 07/13/2019 2357   PCO2ART 35.9 07/13/2019 2357   PO2ART 72.0 (L) 07/13/2019 2357   HCO3 20.2 07/13/2019 2357   TCO2 21 (L) 07/13/2019 2357   ACIDBASEDEF 5.0 (H) 07/13/2019  2357   O2SAT 94.0 07/13/2019 2357     Coagulation Profile: Recent Labs  Lab 07/13/19 0117  INR 1.5*    Cardiac Enzymes: Recent Labs  Lab 07/14/19 0600  CKTOTAL 126    HbA1C: Hgb A1c MFr Bld  Date/Time Value Ref Range Status  07/13/2019 04:08 PM 6.3 (H) 4.8 - 5.6 % Final    Comment:    (NOTE) Pre diabetes:          5.7%-6.4% Diabetes:              >6.4% Glycemic control for   <7.0% adults with diabetes     CBG: Recent Labs  Lab 07/13/19 1058 07/13/19 2309 07/14/19 1059  GLUCAP 79 141* 164*

## 2019-07-18 NOTE — Progress Notes (Signed)
  Speech Language Pathology Treatment: Dysphagia  Patient Details Name: Vernon Frye MRN: [id number] DOB: 1960/10/07 Today's Date: 07/18/2019 Time: 1250-1305 SLP Time Calculation (min) (ACUTE ONLY): 15 min  Assessment / Plan / Recommendation Clinical Impression  Therapy focused on safety and efficiency of po intake and modification to diet/liquid if appropriate. He woke fairly easily with verbal stimulation but drowsy. Resistant when therapist raising head of bed due to pain- used reverse trendelenburg position as well and provided clinical reasoning for positioning.Speech is dysarthric and comprehensible less than 50% of the time. Pt has an open mouth posture at rest but has improved labial closure (from initial eval) and continues to achieve adequate closure on spoon trials nectar thick juice followed by trials thin water. Immediate cough after water, none with thicker liquid. Mandibular movement was initially adequate then he fatigued and increased effort noted leaving cracker residue. He should continue puree and nectar thick liquids presently. Pt is making small progress toward his goals but impacting factor is his positioning due to pain. He has not had MBS due to inability to sit upright for study and not sure how he would tolerate a FEES.   HPI HPI: 58 year old man who is critically ill due to a severe agitated delirium due to toxic metabolic encephalopathy requiring titration of Precedex infusion to maintain patient safety. MRSA bacteremia secondary to septic right joint now status post washout. Overnight pt had issues with agitation and was on BiPAP.       SLP Plan  Continue with current plan of care       Recommendations  Diet recommendations: Dysphagia 1 (puree);Nectar-thick liquid Liquids provided via: Cup;Straw Medication Administration: Whole meds with puree Supervision: Staff to assist with self feeding;Full supervision/cueing for compensatory strategies Compensations:  Slow rate;Small sips/bites Postural Changes and/or Swallow Maneuvers: Seated upright 90 degrees                Oral Care Recommendations: Oral care BID Follow up Recommendations: 24 hour supervision/assistance SLP Visit Diagnosis: Dysphagia, oropharyngeal phase (R13.12) Plan: Continue with current plan of care                       Houston Siren 07/18/2019, 2:04 PM   Orbie Pyo Colvin Caroli.Ed Risk analyst [phone] Office [phone]

## 2019-07-18 NOTE — Progress Notes (Addendum)
Progress Note  Patient Name: Vernon Frye Date of Encounter: 07/18/2019  Primary Cardiologist: Sinclair Grooms, MD  Subjective   Remains in afib but well rate controlled on IV Cardizem gtt.  Confused this am.  Inpatient Medications    Scheduled Meds: . acetaminophen  650 mg Oral Q8H  . Chlorhexidine Gluconate Cloth  6 each Topical Daily  . docusate sodium  100 mg Oral BID  . enoxaparin (LOVENOX) injection  120 mg Subcutaneous Q12H  . folic acid  1 mg Oral Daily  . metoprolol succinate  200 mg Oral Daily  . multivitamin with minerals  1 tablet Oral Daily  . QUEtiapine  25 mg Oral BID  . thiamine  100 mg Oral Daily   Or  . thiamine  100 mg Intravenous Daily   Continuous Infusions: . sodium chloride 50 mL/hr at 07/16/19 1900  . DAPTOmycin (CUBICIN)  IV Stopped (07/17/19 2030)  . dexmedetomidine (PRECEDEX) IV infusion 0.7 mcg/kg/hr (07/18/19 0700)  . diltiazem (CARDIZEM) infusion 15 mg/hr (07/18/19 0700)   PRN Meds: diphenhydrAMINE, haloperidol lactate, LORazepam, menthol-cetylpyridinium **OR** phenol, ondansetron **OR** ondansetron (ZOFRAN) IV, oxyCODONE, Resource ThickenUp Clear, sodium chloride flush   Vital Signs    Vitals:   07/18/19 0449 07/18/19 0500 07/18/19 0600 07/18/19 0700  BP:  (!) 119/92 122/84 116/82  Pulse:  73 79 83  Resp:  (!) 34 (!) 32 (!) 34  Temp:      TempSrc:      SpO2:  100% 100% 97%  Weight: 113.8 kg     Height:        Intake/Output Summary (Last 24 hours) at 07/18/2019 0806 Last data filed at 07/18/2019 0700 Gross per 24 hour  Intake 1430.89 ml  Output 3570 ml  Net -2139.11 ml   Last 3 Weights 07/18/2019 07/16/2019 07/15/2019  Weight (lbs) 250 lb 14.1 oz 266 lb 5.1 oz 266 lb 8.6 oz  Weight (kg) 113.8 kg 120.8 kg 120.9 kg      Telemetry    Afib with CVR - Personally Reviewed  ECG    No new EKG to review- Personally Reviewed  Physical Exam   GEN: Well nourished, well developed in no acute distress HEENT: Normal  NECK: No JVD; No carotid bruits LYMPHATICS: No lymphadenopathy CARDIAC:irregularly irreguar, no murmurs, rubs, gallops RESPIRATORY:  Clear to auscultation without rales, wheezing or rhonchi  ABDOMEN: Soft, non-tender, non-distended MUSCULOSKELETAL:  No edema; No deformity  SKIN: Warm and dry NEUROLOGIC:  confused PSYCHIATRIC:  Cannot assess   Labs    High Sensitivity Troponin:  No results for input(s): TROPONINIHS in the last 720 hours.    Chemistry Recent Labs  Lab 07/12/19 1910 07/13/19 0431 07/14/19 0600 07/17/19 1539 07/18/19 0233  NA 130* 130* 132* 142 143  K 3.6 3.8 4.4 3.5 3.9  CL 91* 95* 98 110 109  CO2 22 21* 19* 21* 22  GLUCOSE 122* 132* 174* 186* 167*  BUN 43* 44* 55* 46* 38*  CREATININE 3.36* 3.08* 2.42* 1.25* 1.00  CALCIUM 8.7* 8.1* 7.6* 7.8* 7.9*  PROT 6.9 5.3*  --   --   --   ALBUMIN 2.5* 1.9*  --   --   --   AST 29 24  --   --   --   ALT 17 14  --   --   --   ALKPHOS 94 68  --   --   --   BILITOT 1.9* 1.6*  --   --   --  GFRNONAA 19* 21* 28* >60 >60  GFRAA 22* 25* 33* >60 >60  ANIONGAP 17* 14 15 11 12      Hematology Recent Labs  Lab 07/16/19 0344 07/17/19 0503 07/18/19 0233  WBC 19.0* 21.2* 19.1*  RBC 3.70* 4.16* 3.80*  HGB 11.0* 12.1* 11.1*  HCT 32.8* 37.0* 32.6*  MCV 88.6 88.9 85.8  MCH 29.7 29.1 29.2  MCHC 33.5 32.7 34.0  RDW 15.3 14.9 14.6  PLT 127* 192 176    BNP Recent Labs  Lab 07/13/19 0431  BNP 299.7*     DDimer  Recent Labs  Lab 07/13/19 1608  DDIMER 3.78*     Radiology    ECHOCARDIOGRAM COMPLETE  Result Date: 07/16/2019   ECHOCARDIOGRAM REPORT   Patient Name:   Vernon Frye Date of Exam: 07/13/2019 Medical Rec #:  [id number]       Height:       75.0 in Accession #:    [id number]      Weight:       260.0 lb Date of Birth:  October 31, 1960      BSA:          2.45 m Patient Age:    72 years        BP:           101/68 mmHg Patient Gender: M               HR:           124 bpm. Exam Location:  Inpatient Procedure: 2D  Echo STAT ECHO Indications:     Atrial Fibrillation 427.31/ I48.91  History:         Patient has no prior history of Echocardiogram examinations.                  Risk Factors:Hypertension. Left knee sepitc joint, acute kidney                  injury, allergic reaction to antibiotic, COVID-19 test                  pending,.  Sonographer:     Darlina Sicilian RDCS Referring Phys:  Teresita Diagnosing Phys: Eleonore Chiquito MD  Sonographer Comments: Patient condition. IMPRESSIONS  1. Left ventricular ejection fraction, by visual estimation, is 55 to 60%. The left ventricle has normal function. There is mildly increased left ventricular hypertrophy.  2. Left ventricular diastolic function could not be evaluated.  3. Right ventricular volume/pressure overload.  4. The left ventricle demonstrates regional wall motion abnormalities.  5. Global right ventricle has severely reduced systolic function.The right ventricular size is severely enlarged. No increase in right ventricular wall thickness.  6. Left atrial size was normal.  7. Right atrial size was mild-moderately dilated.  8. Presence of pericardial fat pad.  9. Trivial pericardial effusion is present. 10. Mild mitral annular calcification. 11. The mitral valve is degenerative. Trivial mitral valve regurgitation. 12. The tricuspid valve is grossly normal. Tricuspid valve regurgitation is trivial. 13. The aortic valve is tricuspid. Aortic valve regurgitation is not visualized. No evidence of aortic valve sclerosis or stenosis. 14. The pulmonic valve was grossly normal. Pulmonic valve regurgitation is not visualized. 15. TR signal is inadequate for assessing pulmonary artery systolic pressure. 16. The inferior vena cava is normal in size with greater than 50% respiratory variability, suggesting right atrial pressure of 3 mmHg. 17. No prior Echocardiogram. FINDINGS  Left Ventricle: Left ventricular ejection fraction, by visual  estimation, is 55 to 60%. The left  ventricle has normal function. The left ventricle demonstrates regional wall motion abnormalities. The left ventricular internal cavity size was the left ventricle is normal in size. There is mildly increased left ventricular hypertrophy. Abnormal (paradoxical) septal motion, consistent with right ventricular volume overload and/or elevated RV end-diastolic pressure. The left ventricular diastology could not be evaluated due to atrial fibrillation. Left ventricular diastolic function could not be evaluated. Right Ventricle: The right ventricular size is severely enlarged. No increase in right ventricular wall thickness. Global RV systolic function is has severely reduced systolic function. Left Atrium: Left atrial size was normal in size. Right Atrium: Right atrial size was mild-moderately dilated Pericardium: Trivial pericardial effusion is present. Presence of pericardial fat pad. Mitral Valve: The mitral valve is degenerative in appearance. Mild mitral annular calcification. Trivial mitral valve regurgitation. Tricuspid Valve: The tricuspid valve is grossly normal. Tricuspid valve regurgitation is trivial. Aortic Valve: The aortic valve is tricuspid. Aortic valve regurgitation is not visualized. The aortic valve is structurally normal, with no evidence of sclerosis or stenosis. Pulmonic Valve: The pulmonic valve was grossly normal. Pulmonic valve regurgitation is not visualized. Pulmonic regurgitation is not visualized. Aorta: The aortic root and ascending aorta are structurally normal, with no evidence of dilitation. Venous: The inferior vena cava is normal in size with greater than 50% respiratory variability, suggesting right atrial pressure of 3 mmHg. IAS/Shunts: No atrial level shunt detected by color flow Doppler.  LEFT VENTRICLE PLAX 2D LVIDd:         4.20 cm LVIDs:         2.90 cm LV PW:         1.50 cm LV IVS:        1.40 cm LVOT diam:     1.90 cm LV SV:         46 ml LV SV Index:   18.34 LVOT Area:      2.84 cm  LEFT ATRIUM            Index LA diam:      4.00 cm  1.63 cm/m LA Vol (A4C): 104.0 ml 42.39 ml/m   AORTA Ao Root diam: 2.70 cm MITRAL VALVE MV Area (PHT): 5.34 cm            SHUNTS MV PHT:        41.18 msec          Systemic Diam: 1.90 cm MV Decel Time: 142 msec MV E velocity: 88.83 cm/s 103 cm/s  Eleonore Chiquito MD Electronically signed by Eleonore Chiquito MD Signature Date/Time: 07/13/2019/8:36:27 AM    Final (Updating)    [place] EKG SITE RITE  Result Date: 07/16/2019 If Site Rite image not attached, placement could not be confirmed due to current cardiac rhythm.   Cardiac Studies   Echo final read stuck in reporting system, working on releasing this. EF normal on my review, no obvious vegetations  Patient Profile     58 y.o. male with recent septic left knee (prosthetic joint infection), MRSA bacteremia. We are following due to post op atrial fibrillation with RVR  Assessment & Plan    Atrial fibrillation with RVR:  -likely exacerbated by sepsis and immediate postoperative period -on heparin for anticoagulation. Notes suggest previously nonadherent to apixaban -received IV amiodarone post op then stopped -went back in to afib with RVR yesterday and placed on Cardizem gtt -HR well controlled -will change IV Cardizem to PO today since able to take  PO and HR controlled -continue therapeutic Lovenox -team discussed TEE/DCCV yesterday since he needs TEE anyway to eval for endocarditis and patient who refused - I talked with him again today and he is confused and does not understand -since his HR is now controlled in afib I do not think that TEE/DCCV is urgent -will wait for MS to clear more -continue Toprol XL 200mg  daily and change Cardizem to short acting PO q 6 hours  Sepsis, MRSA bacteremia:  -s/p irrigation and debridement, antibiotic spacer placement -ID following, on daptomycin -TTE as above.  -TEE when mental status is clear/prior to discharge - yesterday was refusing but  confused today. Will not urgently change management.   For questions or updates, please contact [place] Please consult [email or web address] for contact info under     Signed, Fransico Him, MD  07/18/2019, 8:06 AM

## 2019-07-18 NOTE — Progress Notes (Signed)
Physical Therapy Treatment Patient Details Name: Vernon Frye MRN: [id number] DOB: Dec 19, 1960 Today's Date: 07/18/2019    History of Present Illness Pt adm with septic lt total knee. Underwent I&D and poly exchange of lt knee on 12/16. Pt with agitated delirium and possible withdrawal. PMH - Lt TKR 2016, htn, afib, anxiety, DDD, lt shoulder surgery    PT Comments    Pt is very limited by severe back pain. Back pain seems to be worsening. He is unable to tolerate elevating HOB more than ~15 degrees, couldn't tolerate any mobility even with the ceiling lift, and unable to assist with exercises. Question if he could have a discitis related to infection. Will continue attempts but until pain is improved I am not optimistic that he will progress.    Follow Up Recommendations  SNF;Supervision/Assistance - 24 hour     Equipment Recommendations  Other (comment)(To be determined)    Recommendations for Other Services       Precautions / Restrictions Precautions Precautions: Fall;Knee Required Braces or Orthoses: Knee Immobilizer - Left Knee Immobilizer - Left: Other (comment)(present in room, no specific orders) Restrictions Weight Bearing Restrictions: Yes LLE Weight Bearing: Weight bearing as tolerated    Mobility  Bed Mobility Overal bed mobility: Needs Assistance Bed Mobility: Rolling Rolling: +2 for physical assistance;Total assist         General bed mobility comments: Assist for all aspects. Rolled to place/remove maxisky pad under him  Transfers                 General transfer comment: Attempted to use maxisky to transfer pt bed to recliner. Pt began screaming due to back pain before we were even able raise the lift enough to clear him from surface of bed. Returned to supine.   Ambulation/Gait                 Stairs             Wheelchair Mobility    Modified Rankin (Stroke Patients Only)       Balance                                             Cognition Arousal/Alertness: Awake/alert Behavior During Therapy: Flat affect Overall Cognitive Status: Difficult to assess Area of Impairment: Orientation;Attention;Memory;Following commands;Safety/judgement;Awareness;Problem solving                 Orientation Level: Disoriented to;Time Current Attention Level: Sustained Memory: Decreased short-term memory;Decreased recall of precautions Following Commands: Follows one step commands with increased time;Follows one step commands inconsistently Safety/Judgement: Decreased awareness of safety;Decreased awareness of deficits Awareness: Intellectual Problem Solving: Slow processing;Decreased initiation;Difficulty sequencing;Requires verbal cues;Requires tactile cues General Comments: Pt very irritable and begans yelling a caregivers easily. At times pt coherent and told me he was supposed to see Dr. Nelva Bush for his back after all of this is cleared up - I think this may have been what [name] told him yesterday. A few minutes later he wasn't even following one step commands to assist me with exercises and was actually resisting what I was trying to assist him.       Exercises Total Joint Exercises Goniometric ROM: Attempted passive or active assistive ROM in supine. Only able to achieve 5-15 degrees of flexion due to pt resisting movement.     General Comments  Pertinent Vitals/Pain Pain Assessment: 0-10 Faces Pain Scale: Hurts worst Pain Location: back Pain Descriptors / Indicators: Other (Comment);Stabbing(Yelling) Pain Intervention(s): Limited activity within patient's tolerance;Monitored during session;Repositioned    Home Living                      Prior Function            PT Goals (current goals can now be found in the care plan section) Acute Rehab PT Goals Patient Stated Goal: unable to state Progress towards PT goals: Not progressing toward goals - comment(due to  back pain)    Frequency    Min 3X/week      PT Plan Current plan remains appropriate;Frequency needs to be updated    Co-evaluation              AM-PAC PT "6 Clicks" Mobility   Outcome Measure  Help needed turning from your back to your side while in a flat bed without using bedrails?: Total Help needed moving from lying on your back to sitting on the side of a flat bed without using bedrails?: Total Help needed moving to and from a bed to a chair (including a wheelchair)?: Total Help needed standing up from a chair using your arms (e.g., wheelchair or bedside chair)?: Total Help needed to walk in hospital room?: Total Help needed climbing 3-5 steps with a railing? : Total 6 Click Score: 6    End of Session   Activity Tolerance: Patient limited by pain Patient left: in bed;with call bell/phone within reach Nurse Communication: Mobility status;Need for lift equipment PT Visit Diagnosis: Other abnormalities of gait and mobility (R26.89);Pain Pain - Right/Left: Left Pain - part of body: Knee     Time: [id number] PT Time Calculation (min) (ACUTE ONLY): 31 min  Charges:  $Therapeutic Activity: 23-37 mins                     [place] Pager [phone] Office [place] 07/18/2019, 4:41 PM

## 2019-07-18 NOTE — Progress Notes (Addendum)
Subjective:  Has been complaining of back pain fairly confused today   Antibiotics:  Anti-infectives (From admission, onward)   Start     Dose/Rate Route Frequency Ordered Stop   07/14/19 2000  DAPTOmycin (CUBICIN) 1,000 mg in sodium chloride 0.9 % IVPB     1,000 mg 240 mL/hr over 30 Minutes Intravenous Daily 07/14/19 0807     07/13/19 2119  vancomycin (VANCOCIN) powder  Status:  Discontinued       As needed 07/13/19 2119 07/13/19 2154   07/13/19 1400  DAPTOmycin (CUBICIN) 944 mg in sodium chloride 0.9 % IVPB  Status:  Discontinued     8 mg/kg  118 kg 237.8 mL/hr over 30 Minutes Intravenous Daily 07/13/19 1320 07/14/19 0807   07/13/19 1015  piperacillin-tazobactam (ZOSYN) IVPB 3.375 g  Status:  Discontinued     3.375 g 12.5 mL/hr over 240 Minutes Intravenous Every 8 hours 07/13/19 1002 07/13/19 1535   07/13/19 0400  cefTRIAXone (ROCEPHIN) 2 g in sodium chloride 0.9 % 100 mL IVPB  Status:  Discontinued     2 g 200 mL/hr over 30 Minutes Intravenous Every 24 hours 07/13/19 0337 07/13/19 0951   07/13/19 0400  vancomycin (VANCOCIN) 2,000 mg in sodium chloride 0.9 % 500 mL IVPB     2,000 mg 250 mL/hr over 120 Minutes Intravenous  Once 07/13/19 0339 07/13/19 0707   07/13/19 0339  vancomycin variable dose per unstable renal function (pharmacist dosing)  Status:  Discontinued      Does not apply See admin instructions 07/13/19 0340 07/13/19 0911      Medications: Scheduled Meds: . acetaminophen  650 mg Oral Q8H  . Chlorhexidine Gluconate Cloth  6 each Topical Daily  . docusate sodium  100 mg Oral BID  . enoxaparin (LOVENOX) injection  120 mg Subcutaneous Q12H  . folic acid  1 mg Oral Daily  . metoprolol succinate  200 mg Oral Daily  . multivitamin with minerals  1 tablet Oral Daily  . QUEtiapine  25 mg Oral BID  . thiamine  100 mg Oral Daily   Or  . thiamine  100 mg Intravenous Daily   Continuous Infusions: . sodium chloride 50 mL/hr at 07/18/19 1500  . DAPTOmycin  (CUBICIN)  IV Stopped (07/17/19 2030)  . dexmedetomidine (PRECEDEX) IV infusion 0.7 mcg/kg/hr (07/18/19 1500)  . diltiazem (CARDIZEM) infusion Stopped (07/18/19 1106)   PRN Meds:.diphenhydrAMINE, haloperidol lactate, LORazepam, menthol-cetylpyridinium **OR** phenol, metoprolol tartrate, ondansetron **OR** ondansetron (ZOFRAN) IV, oxyCODONE, Resource ThickenUp Clear, sodium chloride flush    Objective: Weight change:   Intake/Output Summary (Last 24 hours) at 07/18/2019 1621 Last data filed at 07/18/2019 1500 Gross per 24 hour  Intake 1825.31 ml  Output 2540 ml  Net -714.69 ml   Blood pressure 136/83, pulse 89, temperature 98.3 F (36.8 C), temperature source Oral, resp. rate (!) 33, height 6\' 3"  (1.905 m), weight 113.8 kg, SpO2 100 %. Temp:  [98.3 F (36.8 C)-100.2 F (37.9 C)] 98.3 F (36.8 C) (12/21 1530) Pulse Rate:  [73-114] 89 (12/21 1600) Resp:  [19-44] 33 (12/21 1600) BP: (112-138)/(70-97) 136/83 (12/21 1600) SpO2:  [94 %-100 %] 100 % (12/21 1600) Weight:  [113.8 kg] 113.8 kg (12/21 0449)  Physical Exam: General: Arousable but confused HEENT: anicteric sclera, EOMI CVS tachycardic but no murmurs gallops rubs heard  Chest: , Relatively clear Abdomen: soft non-distended,  Extremities: Left knee with bandage his legs have fairly sick significant 3+ pitting edema skin: no rashes Neuro:  nonfocal  CBC:    BMET Recent Labs    07/17/19 1539 07/18/19 0233  NA 142 143  K 3.5 3.9  CL 110 109  CO2 21* 22  GLUCOSE 186* 167*  BUN 46* 38*  CREATININE 1.25* 1.00  CALCIUM 7.8* 7.9*     Liver Panel  No results for input(s): PROT, ALBUMIN, AST, ALT, ALKPHOS, BILITOT, BILIDIR, IBILI in the last 72 hours.     Sedimentation Rate No results for input(s): ESRSEDRATE in the last 72 hours. C-Reactive Protein No results for input(s): CRP in the last 72 hours.  Micro Results: Recent Results (from the past 720 hour(s))  Blood Culture (routine x 2)     Status:  Abnormal   Collection Time: 07/13/19  1:10 AM   Specimen: BLOOD  Result Value Ref Range Status   Specimen Description BLOOD RIGHT ANTECUBITAL  Final   Special Requests   Final    BOTTLES DRAWN AEROBIC AND ANAEROBIC Blood Culture adequate volume   Culture  Setup Time   Final    GRAM POSITIVE COCCI IN CLUSTERS IN BOTH AEROBIC AND ANAEROBIC BOTTLES CRITICAL RESULT CALLED TO, READ BACK BY AND VERIFIED WITH: Salome Holmes PHARMD 1510 07/13/19 A BROWNING    Culture (A)  Final    STAPHYLOCOCCUS AUREUS SUSCEPTIBILITIES PERFORMED ON PREVIOUS CULTURE WITHIN THE LAST 5 DAYS. Performed at [facility], [place] [address], [place], [place] [address]    Report Status 07/15/2019 FINAL  Final  Blood Culture (routine x 2)     Status: Abnormal   Collection Time: 07/13/19  1:14 AM   Specimen: BLOOD  Result Value Ref Range Status   Specimen Description BLOOD LEFT ANTECUBITAL  Final   Special Requests   Final    BOTTLES DRAWN AEROBIC AND ANAEROBIC Blood Culture adequate volume   Culture  Setup Time   Final    GRAM POSITIVE COCCI IN CLUSTERS IN BOTH AEROBIC AND ANAEROBIC BOTTLES CRITICAL RESULT CALLED TO, READ BACK BY AND VERIFIED WITH: Salome Holmes [facility] [id number] 07/13/19 A BROWNING Performed at [facility], [place] [address] [place], [place] [address]    Culture METHICILLIN RESISTANT STAPHYLOCOCCUS AUREUS (A)  Final   Report Status 07/15/2019 FINAL  Final   Organism ID, Bacteria METHICILLIN RESISTANT STAPHYLOCOCCUS AUREUS  Final      Susceptibility   Methicillin resistant staphylococcus aureus - MIC*    CIPROFLOXACIN >=8 RESISTANT Resistant     ERYTHROMYCIN >=8 RESISTANT Resistant     GENTAMICIN <=0.5 SENSITIVE Sensitive     OXACILLIN >=4 RESISTANT Resistant     TETRACYCLINE <=1 SENSITIVE Sensitive     VANCOMYCIN <=0.5 SENSITIVE Sensitive     TRIMETH/SULFA <=10 SENSITIVE Sensitive     CLINDAMYCIN <=0.25 SENSITIVE Sensitive     RIFAMPIN <=0.5 SENSITIVE Sensitive     Inducible Clindamycin  NEGATIVE Sensitive     * METHICILLIN RESISTANT STAPHYLOCOCCUS AUREUS  Blood Culture ID Panel (Reflexed)     Status: Abnormal   Collection Time: 07/13/19  1:14 AM  Result Value Ref Range Status   Enterococcus species NOT DETECTED NOT DETECTED Final   Listeria monocytogenes NOT DETECTED NOT DETECTED Final   Staphylococcus species DETECTED (A) NOT DETECTED Final    Comment: CRITICAL RESULT CALLED TO, READ BACK BY AND VERIFIED WITH: Salome Holmes PHARMD 1510 07/13/19 A BROWNING    Staphylococcus aureus (BCID) DETECTED (A) NOT DETECTED Final    Comment: Methicillin (oxacillin)-resistant Staphylococcus aureus (MRSA). MRSA is predictably resistant to beta-lactam antibiotics (except ceftaroline).  Preferred therapy is vancomycin unless clinically contraindicated. Patient requires contact precautions if  hospitalized. CRITICAL RESULT CALLED TO, READ BACK BY AND VERIFIED WITH: Salome Holmes PHARMD [id number] 07/13/19 A BROWNING    Methicillin resistance DETECTED (A) NOT DETECTED Final    Comment: CRITICAL RESULT CALLED TO, READ BACK BY AND VERIFIED WITH: Salome Holmes PHARMD 1510 07/13/19 A BROWNING    Streptococcus species NOT DETECTED NOT DETECTED Final   Streptococcus agalactiae NOT DETECTED NOT DETECTED Final   Streptococcus pneumoniae NOT DETECTED NOT DETECTED Final   Streptococcus pyogenes NOT DETECTED NOT DETECTED Final   Acinetobacter baumannii NOT DETECTED NOT DETECTED Final   Enterobacteriaceae species NOT DETECTED NOT DETECTED Final   Enterobacter cloacae complex NOT DETECTED NOT DETECTED Final   Escherichia coli NOT DETECTED NOT DETECTED Final   Klebsiella oxytoca NOT DETECTED NOT DETECTED Final   Klebsiella pneumoniae NOT DETECTED NOT DETECTED Final   Proteus species NOT DETECTED NOT DETECTED Final   Serratia marcescens NOT DETECTED NOT DETECTED Final   Haemophilus influenzae NOT DETECTED NOT DETECTED Final   Neisseria meningitidis NOT DETECTED NOT DETECTED Final   Pseudomonas aeruginosa NOT  DETECTED NOT DETECTED Final   Candida albicans NOT DETECTED NOT DETECTED Final   Candida glabrata NOT DETECTED NOT DETECTED Final   Candida krusei NOT DETECTED NOT DETECTED Final   Candida parapsilosis NOT DETECTED NOT DETECTED Final   Candida tropicalis NOT DETECTED NOT DETECTED Final    Comment: Performed at [facility], [place]. [address] [place], [place] [address]  SARS CORONAVIRUS 2 (TAT 6-24 HRS) Nasopharyngeal Nasopharyngeal Swab     Status: None   Collection Time: 07/13/19  2:57 AM   Specimen: Nasopharyngeal Swab  Result Value Ref Range Status   SARS Coronavirus 2 NEGATIVE NEGATIVE Final    Comment: (NOTE) SARS-CoV-2 target nucleic acids are NOT DETECTED. The SARS-CoV-2 RNA is generally detectable in upper and lower respiratory specimens during the acute phase of infection. Negative results do not preclude SARS-CoV-2 infection, do not rule out co-infections with other pathogens, and should not be used as the sole basis for treatment or other patient management decisions. Negative results must be combined with clinical observations, patient history, and epidemiological information. The expected result is Negative. Fact Sheet for Patients: SugarRoll.be Fact Sheet for Healthcare Providers: https://www.woods-mathews.com/ This test is not yet approved or cleared by the [place] FDA and  has been authorized for detection and/or diagnosis of SARS-CoV-2 by FDA under an Emergency Use Authorization (EUA). This EUA will remain  in effect (meaning this test can be used) for the duration of the COVID-19 declaration under Section 56 4(b)(1) of the Act, 21 U.S.C. section 360bbb-3(b)(1), unless the authorization is terminated or revoked sooner. Performed at [facility], [place] [address] [place], [place] [address]   Respiratory Panel by RT PCR (Flu A&B, Covid) - Nasopharyngeal Swab     Status: None   Collection Time: 07/13/19  6:56 AM    Specimen: Nasopharyngeal Swab  Result Value Ref Range Status   SARS Coronavirus 2 by RT PCR NEGATIVE NEGATIVE Final    Comment: (NOTE) SARS-CoV-2 target nucleic acids are NOT DETECTED. The SARS-CoV-2 RNA is generally detectable in upper respiratoy specimens during the acute phase of infection. The lowest concentration of SARS-CoV-2 viral copies this assay can detect is 131 copies/mL. A negative result does not preclude SARS-Cov-2 infection and should not be used as the sole basis for treatment or other patient management decisions. A negative result may occur  with  improper specimen collection/handling, submission of specimen other than nasopharyngeal swab, presence of viral mutation(s) within the areas targeted by this assay, and inadequate number of viral copies (<131 copies/mL). A negative result must be combined with clinical observations, patient history, and epidemiological information. The expected result is Negative. Fact Sheet for Patients:  PinkCheek.be Fact Sheet for Healthcare Providers:  GravelBags.it This test is not yet ap proved or cleared by the [place] FDA and  has been authorized for detection and/or diagnosis of SARS-CoV-2 by FDA under an Emergency Use Authorization (EUA). This EUA will remain  in effect (meaning this test can be used) for the duration of the COVID-19 declaration under Section 564(b)(1) of the Act, 21 U.S.C. section 360bbb-3(b)(1), unless the authorization is terminated or revoked sooner.    Influenza A by PCR NEGATIVE NEGATIVE Final   Influenza B by PCR NEGATIVE NEGATIVE Final    Comment: (NOTE) The Xpert Xpress SARS-CoV-2/FLU/RSV assay is intended as an aid in  the diagnosis of influenza from Nasopharyngeal swab specimens and  should not be used as a sole basis for treatment. Nasal washings and  aspirates are unacceptable for Xpert Xpress SARS-CoV-2/FLU/RSV  testing. Fact  Sheet for Patients: PinkCheek.be Fact Sheet for Healthcare Providers: GravelBags.it This test is not yet approved or cleared by the [place] FDA and  has been authorized for detection and/or diagnosis of SARS-CoV-2 by  FDA under an Emergency Use Authorization (EUA). This EUA will remain  in effect (meaning this test can be used) for the duration of the  Covid-19 declaration under Section 564(b)(1) of the Act, 21  U.S.C. section 360bbb-3(b)(1), unless the authorization is  terminated or revoked. Performed at [facility], [place] [address], [place], [place] [address]   Urine culture     Status: None   Collection Time: 07/13/19  8:18 AM   Specimen: In/Out Cath Urine  Result Value Ref Range Status   Specimen Description IN/OUT CATH URINE  Final   Special Requests NONE  Final   Culture   Final    NO GROWTH Performed at [facility], [place] [address] [place], [place] [address]    Report Status 07/14/2019 FINAL  Final  MRSA PCR Screening     Status: Abnormal   Collection Time: 07/13/19 10:59 AM   Specimen: Nasal Mucosa; Nasopharyngeal  Result Value Ref Range Status   MRSA by PCR POSITIVE (A) NEGATIVE Final    Comment:        The GeneXpert MRSA Assay (FDA approved for NASAL specimens only), is one component of a comprehensive MRSA colonization surveillance program. It is not intended to diagnose MRSA infection nor to guide or monitor treatment for MRSA infections. RESULT CALLED TO, READ BACK BY AND VERIFIED WITH: B. Adonis Brook RN 13:15 07/13/19 ([email or web address]) Performed at [facility], [place] [address] [place], [place] [address]   Body fluid culture     Status: None   Collection Time: 07/13/19  1:29 PM   Specimen: Synovium; Body Fluid  Result Value Ref Range Status   Specimen Description SYNOVIAL KNEE LEFT  Final   Special Requests NONE  Final   Gram Stain   Final    ABUNDANT WBC PRESENT, PREDOMINANTLY  MONONUCLEAR ABUNDANT GRAM POSITIVE COCCI Performed at [facility], [place] [address] [place],  [address]    Culture   Final    ABUNDANT METHICILLIN RESISTANT STAPHYLOCOCCUS AUREUS   Report Status 07/15/2019 FINAL  Final   Organism  ID, Bacteria METHICILLIN RESISTANT STAPHYLOCOCCUS AUREUS  Final      Susceptibility   Methicillin resistant staphylococcus aureus - MIC*    CIPROFLOXACIN >=8 RESISTANT Resistant     ERYTHROMYCIN >=8 RESISTANT Resistant     GENTAMICIN <=0.5 SENSITIVE Sensitive     OXACILLIN >=4 RESISTANT Resistant     TETRACYCLINE <=1 SENSITIVE Sensitive     VANCOMYCIN <=0.5 SENSITIVE Sensitive     TRIMETH/SULFA <=10 SENSITIVE Sensitive     CLINDAMYCIN <=0.25 SENSITIVE Sensitive     RIFAMPIN <=0.5 SENSITIVE Sensitive     Inducible Clindamycin NEGATIVE Sensitive     * ABUNDANT METHICILLIN RESISTANT STAPHYLOCOCCUS AUREUS  Group A Strep by PCR     Status: None   Collection Time: 07/13/19  4:08 PM   Specimen: Throat; Sterile Swab  Result Value Ref Range Status   Group A Strep by PCR NOT DETECTED NOT DETECTED Final    Comment: Performed at [facility], [place] [address] [place], [place] [address]  Culture, blood (routine x 2)     Status: Abnormal   Collection Time: 07/13/19  5:14 PM   Specimen: BLOOD RIGHT HAND  Result Value Ref Range Status   Specimen Description BLOOD RIGHT HAND  Final   Special Requests   Final    BOTTLES DRAWN AEROBIC ONLY Blood Culture adequate volume   Culture  Setup Time   Final    GRAM POSITIVE COCCI AEROBIC BOTTLE ONLY CRITICAL VALUE NOTED.  VALUE IS CONSISTENT WITH PREVIOUSLY REPORTED AND CALLED VALUE.    Culture (A)  Final    STAPHYLOCOCCUS AUREUS SUSCEPTIBILITIES PERFORMED ON PREVIOUS CULTURE WITHIN THE LAST 5 DAYS. Performed at [facility], [place] [address] [place], [place] [address]    Report Status 07/17/2019 FINAL  Final  Culture, blood (routine x 2)     Status: Abnormal   Collection Time: 07/13/19  5:14 PM    Specimen: BLOOD RIGHT HAND  Result Value Ref Range Status   Specimen Description BLOOD RIGHT HAND  Final   Special Requests   Final    BOTTLES DRAWN AEROBIC ONLY Blood Culture adequate volume   Culture  Setup Time   Final    GRAM POSITIVE COCCI AEROBIC BOTTLE ONLY CRITICAL VALUE NOTED.  VALUE IS CONSISTENT WITH PREVIOUSLY REPORTED AND CALLED VALUE.    Culture (A)  Final    STAPHYLOCOCCUS AUREUS SUSCEPTIBILITIES PERFORMED ON PREVIOUS CULTURE WITHIN THE LAST 5 DAYS. Performed at [facility], [place] [address], [place], [place] [address]    Report Status 07/18/2019 FINAL  Final  Culture, blood (routine x 2)     Status: Abnormal   Collection Time: 07/14/19  9:50 AM   Specimen: BLOOD  Result Value Ref Range Status   Specimen Description BLOOD RIGHT ANTECUBITAL  Final   Special Requests   Final    BOTTLES DRAWN AEROBIC ONLY Blood Culture adequate volume   Culture  Setup Time   Final    GRAM POSITIVE COCCI AEROBIC BOTTLE ONLY CRITICAL VALUE NOTED.  VALUE IS CONSISTENT WITH PREVIOUSLY REPORTED AND CALLED VALUE.    Culture (A)  Final    STAPHYLOCOCCUS AUREUS SUSCEPTIBILITIES PERFORMED ON PREVIOUS CULTURE WITHIN THE LAST 5 DAYS. Performed at [facility], [place] [address] [place], [place] [address]    Report Status 07/17/2019 FINAL  Final  Culture, blood (routine x 2)     Status: None (Preliminary result)   Collection Time: 07/14/19  9:56 AM   Specimen: BLOOD RIGHT  HAND  Result Value Ref Range Status   Specimen Description BLOOD RIGHT HAND  Final   Special Requests   Final    BOTTLES DRAWN AEROBIC ONLY Blood Culture results may not be optimal due to an inadequate volume of blood received in culture bottles   Culture   Final    NO GROWTH 4 DAYS Performed at [place] [address] [place], [place] [address]    Report Status PENDING  Incomplete  Culture, blood (routine x 2)     Status: None (Preliminary result)   Collection Time: 07/15/19 12:21 PM   Specimen:  BLOOD RIGHT HAND  Result Value Ref Range Status   Specimen Description BLOOD RIGHT HAND  Final   Special Requests   Final    BOTTLES DRAWN AEROBIC ONLY Blood Culture results may not be optimal due to an inadequate volume of blood received in culture bottles   Culture   Final    NO GROWTH 3 DAYS Performed at [facility], [place] [address] [place], [place] [address]    Report Status PENDING  Incomplete  Culture, blood (routine x 2)     Status: None (Preliminary result)   Collection Time: 07/15/19  1:20 PM   Specimen: BLOOD RIGHT ARM  Result Value Ref Range Status   Specimen Description BLOOD RIGHT ARM  Final   Special Requests   Final    BOTTLES DRAWN AEROBIC ONLY Blood Culture adequate volume   Culture   Final    NO GROWTH 3 DAYS Performed at [facility], [address] [place],  [address]    Report Status PENDING  Incomplete    Studies/Results: No results found.    Assessment/Plan:  INTERVAL HISTORY: Patient complaining of back pain became more prominent when nursing staff try to get him to a chair today   Principal Problem:   MRSA bacteremia Active Problems:   Septic joint ([place])   Atrial fibrillation with rapid ventricular response (HCC)   AKI (acute kidney injury) ([place])   Hyponatremia   Severe sepsis (HCC)   Severe sepsis with acute organ dysfunction (HCC)   Respiratory insufficiency   Acute pain of left knee   Prosthetic joint infection ([place])   Tachypnea    Vernon Frye is a 58 y.o. 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  #1        [facility] Staphylococcus aureus bacteremia   Staphylococcus aureus bacteremia (SAB) is associated with a high rate of complications and mortality.  Specific aspects of clinical management are critical to optimizing the outcome of  patients with SAB.  Therefore, the [facility] Antimicrobial Management Team [facility]) has initiated an intervention aimed at improving the management of SAB at [facility].  To do so, Infectious Diseases physicians are providing an evidence-based consult for the management of all patients with SAB.     Yes No Comments  Perform follow-up blood cultures (even if the patient is afebrile) to ensure clearance of bacteremia [x]  []   I repeated blood cultures after his midline was removed  Remove vascular catheter and obtain follow-up blood cultures after the removal of the catheter []  []   he has a new central line that was placed on Saturday hopefully after bacteremia was cleared  Perform echocardiography to evaluate for endocarditis (transthoracic ECHO is 40-50% sensitive, TEE  is > 90% sensitive) []  []   he is going to have a transesophageal echocardiogram at some point but currently not able to consent  Consult electrophysiologist to evaluate implanted cardiac device (pacemaker, ICD) []  []    Ensure source control []  []  Have all abscesses been drained effectively? Have deep seeded infections (septic joints or osteomyelitis) had appropriate surgical debridement?  Knee seems to have been the source  Investigate for "metastatic" sites of infection []  []  Does the patient have ANY symptom or physical exam finding that would suggest a deeper infection (back or neck pain that may be suggestive of vertebral osteomyelitis or epidural abscess, muscle pain that could be a symptom of pyomyositis)?  Keep in mind that for deep seeded infections MRI imaging with contrast is preferred rather than other often insensitive tests such as plain x-rays, especially early in a patient's presentation.  I am ordering MRI of the lumbar spine  Change antibiotic therapy to DAPTOMYCIN []  []  Beta-lactam antibiotics are preferred for MSSA due to higher cure rates.   If on Vancomycin, goal trough should be 15 - 20 mcg/mL  Estimated  duration of IV antibiotic therapy: 6 weeks followed by 5 to 6 months of oral antibiotics given prosthetic joint infection []  []  Consult case management for probably prolonged outpatient IV antibiotic therapy   #2 prosthetic joint infection: We will give him at least 6 months of therapy normally would add rifampin as an adjunct of drug but with his alcoholism never going to do that.  3.  Low back pain we will get MRI lumbar spine  Atrial fibrillation cardiology was considering DC cardioversion but now with rate control.     LOS: 5 days   Alcide Evener 07/18/2019, 4:21 PM

## 2019-07-19 LAB — BASIC METABOLIC PANEL
Anion gap: 13 (ref 5–15)
BUN: 31 mg/dL — ABNORMAL HIGH (ref 6–20)
CO2: 20 mmol/L — ABNORMAL LOW (ref 22–32)
Calcium: 7.7 mg/dL — ABNORMAL LOW (ref 8.9–10.3)
Chloride: 109 mmol/L (ref 98–111)
Creatinine, Ser: 1.09 mg/dL (ref 0.61–1.24)
GFR calc Af Amer: >60 mL/min (ref >60)
GFR calc non Af Amer: >60 mL/min (ref >60)
Glucose, Bld: 156 mg/dL — ABNORMAL HIGH (ref 70–99)
Potassium: 3.9 mmol/L (ref 3.5–5.1)
Sodium: 142 mmol/L (ref 135–145)

## 2019-07-19 LAB — CULTURE, BLOOD (ROUTINE X 2): Culture: NO GROWTH

## 2019-07-19 LAB — GLUCOSE, CAPILLARY: Glucose-Capillary: 170 mg/dL — ABNORMAL HIGH (ref 70–99)

## 2019-07-19 MED ORDER — DILTIAZEM HCL 60 MG PO TABS
60.0000 mg | ORAL_TABLET | Freq: Four times a day (QID) | ORAL | Status: DC
  Administered 2019-07-19 – 2019-07-21 (×9): 60 mg via ORAL
  Filled 2019-07-19 (×9): qty 1

## 2019-07-19 MED ORDER — VANCOMYCIN HCL IN DEXTROSE 1-5 GM/200ML-% IV SOLN
1000.0000 mg | Freq: Two times a day (BID) | INTRAVENOUS | Status: DC
  Administered 2019-07-19 – 2019-07-22 (×6): 1000 mg via INTRAVENOUS
  Filled 2019-07-19 (×7): qty 200

## 2019-07-19 MED ORDER — CHLORHEXIDINE GLUCONATE CLOTH 2 % EX PADS
6.0000 | MEDICATED_PAD | Freq: Every day | CUTANEOUS | Status: DC
  Administered 2019-07-19: 6 via TOPICAL

## 2019-07-19 NOTE — Progress Notes (Signed)
CSW attempted to speak with patient regarding potential SNF placement, however he is asleep at this time. CSW will continue following patient for discharge planning.  Madilyn Fireman, MSW, LCSW-A Transitions of Care  Clinical Social Worker  [facility]  Medical ICU [phone]

## 2019-07-19 NOTE — Progress Notes (Signed)
  Speech Language Pathology Treatment: Dysphagia  Patient Details Name: Vernon Frye MRN: [id number] DOB: Oct 01, 1960 Today's Date: 07/19/2019 Time: [id number] SLP Time Calculation (min) (ACUTE ONLY): 24 min  Assessment / Plan / Recommendation Clinical Impression  Called by RN to see if speech could see pt today due to overall improvements. Therapist found pt awake, lucid, speech intelligible and cooperative. Pt allowed SLP to elevate head of bed further than yesterday and used reverse trendelenburg position. He coughed following larger straw sip thin but appeared to tolerate thin via cup from a subjective observation over multiple trials. Vocal quality remained clear. He was able to masticate regular texture leaving min residue. Educated pt re: importance of allowing nurses to place him in upright position for all po's. Recommend Dys 3, thin, no straws and pills whole in puree. Will follow up briefly for safety and efficiency with recommendations.    HPI HPI: 58 year old man who is critically ill due to a severe agitated delirium due to toxic metabolic encephalopathy requiring titration of Precedex infusion to maintain patient safety. MRSA bacteremia secondary to septic right joint now status post washout. Overnight pt had issues with agitation and was on BiPAP.       SLP Plan  Continue with current plan of care       Recommendations  Diet recommendations: Dysphagia 3 (mechanical soft);Thin liquid Liquids provided via: Cup;No straw Medication Administration: Whole meds with puree Supervision: Patient able to self feed;Full supervision/cueing for compensatory strategies Compensations: Slow rate;Minimize environmental distractions;Small sips/bites;Other (Comment)(upright position- use reverse trendelenburg) Postural Changes and/or Swallow Maneuvers: (upright position- use reverse trendelenburg)                Oral Care Recommendations: Oral care BID Follow up Recommendations:  (TBD) SLP Visit Diagnosis: Dysphagia, unspecified (R13.10) Plan: Continue with current plan of care       GO                Houston Siren 07/19/2019, 3:34 PM

## 2019-07-19 NOTE — Progress Notes (Signed)
Subjective:  Says he has less back pain today claims he only drinks alcohol 2 drinks a week which is clearly not the case   Antibiotics:  Anti-infectives (From admission, onward)   Start     Dose/Rate Route Frequency Ordered Stop   07/19/19 1300  vancomycin (VANCOCIN) IVPB 1000 mg/200 mL premix     1,000 mg 66.7 mL/hr over 180 Minutes Intravenous Every 12 hours 07/19/19 1145     07/14/19 2000  DAPTOmycin (CUBICIN) 1,000 mg in sodium chloride 0.9 % IVPB  Status:  Discontinued     1,000 mg 240 mL/hr over 30 Minutes Intravenous Daily 07/14/19 0807 07/19/19 1145   07/13/19 2119  vancomycin (VANCOCIN) powder  Status:  Discontinued       As needed 07/13/19 2119 07/13/19 2154   07/13/19 1400  DAPTOmycin (CUBICIN) 944 mg in sodium chloride 0.9 % IVPB  Status:  Discontinued     8 mg/kg  118 kg 237.8 mL/hr over 30 Minutes Intravenous Daily 07/13/19 1320 07/14/19 0807   07/13/19 1015  piperacillin-tazobactam (ZOSYN) IVPB 3.375 g  Status:  Discontinued     3.375 g 12.5 mL/hr over 240 Minutes Intravenous Every 8 hours 07/13/19 1002 07/13/19 1535   07/13/19 0400  cefTRIAXone (ROCEPHIN) 2 g in sodium chloride 0.9 % 100 mL IVPB  Status:  Discontinued     2 g 200 mL/hr over 30 Minutes Intravenous Every 24 hours 07/13/19 0337 07/13/19 0951   07/13/19 0400  vancomycin (VANCOCIN) 2,000 mg in sodium chloride 0.9 % 500 mL IVPB     2,000 mg 250 mL/hr over 120 Minutes Intravenous  Once 07/13/19 0339 07/13/19 0707   07/13/19 0339  vancomycin variable dose per unstable renal function (pharmacist dosing)  Status:  Discontinued      Does not apply See admin instructions 07/13/19 0340 07/13/19 0911      Medications: Scheduled Meds: . Chlorhexidine Gluconate Cloth  6 each Topical Daily  . diltiazem  60 mg Oral Q6H  . docusate sodium  100 mg Oral BID  . enoxaparin (LOVENOX) injection  120 mg Subcutaneous Q12H  . folic acid  1 mg Oral Daily  . metoprolol succinate  200 mg Oral Daily  .  multivitamin with minerals  1 tablet Oral Daily  . QUEtiapine  25 mg Oral BID  . thiamine  100 mg Oral Daily   Or  . thiamine  100 mg Intravenous Daily   Continuous Infusions: . sodium chloride 50 mL/hr at 07/19/19 1200  . vancomycin     PRN Meds:.diphenhydrAMINE, haloperidol lactate, LORazepam, menthol-cetylpyridinium **OR** phenol, metoprolol tartrate, ondansetron **OR** ondansetron (ZOFRAN) IV, oxyCODONE, Resource ThickenUp Clear, sodium chloride flush    Objective: Weight change: -0.5 kg  Intake/Output Summary (Last 24 hours) at 07/19/2019 1255 Last data filed at 07/19/2019 1200 Gross per 24 hour  Intake 1567.69 ml  Output 1645 ml  Net -77.31 ml   Blood pressure (!) 157/103, pulse (!) 124, temperature 98.9 F (37.2 C), temperature source Axillary, resp. rate (!) 26, height 6\' 3"  (1.905 m), weight 113.3 kg, SpO2 100 %. Temp:  [98 F (36.7 C)-99.1 F (37.3 C)] 98.9 F (37.2 C) (12/22 1100) Pulse Rate:  [81-124] 124 (12/22 1200) Resp:  [19-43] 26 (12/22 1200) BP: (118-157)/(82-114) 157/103 (12/22 1200) SpO2:  [95 %-100 %] 100 % (12/22 1200) Weight:  [113.3 kg] 113.3 kg (12/22 0429)  Physical Exam: General: Alert oriented to being in the hospital but wants to go home HEENT:  anicteric sclera, EOMI CVS tachycardic but no murmurs gallops rubs heard  Chest: , Relatively clear Abdomen: soft non-distended,  Extremities: Left knee with bandage his legs have fairly sick significant 3+ pitting edema skin: no rashes Neuro: nonfocal  CBC:    BMET Recent Labs    07/18/19 0233 07/19/19 0527  NA 143 142  K 3.9 3.9  CL 109 109  CO2 22 20*  GLUCOSE 167* 156*  BUN 38* 31*  CREATININE 1.00 1.09  CALCIUM 7.9* 7.7*     Liver Panel  No results for input(s): PROT, ALBUMIN, AST, ALT, ALKPHOS, BILITOT, BILIDIR, IBILI in the last 72 hours.     Sedimentation Rate No results for input(s): ESRSEDRATE in the last 72 hours. C-Reactive Protein No results for input(s): CRP  in the last 72 hours.  Micro Results: Recent Results (from the past 720 hour(s))  Blood Culture (routine x 2)     Status: Abnormal   Collection Time: 07/13/19  1:10 AM   Specimen: BLOOD  Result Value Ref Range Status   Specimen Description BLOOD RIGHT ANTECUBITAL  Final   Special Requests   Final    BOTTLES DRAWN AEROBIC AND ANAEROBIC Blood Culture adequate volume   Culture  Setup Time   Final    GRAM POSITIVE COCCI IN CLUSTERS IN BOTH AEROBIC AND ANAEROBIC BOTTLES CRITICAL RESULT CALLED TO, READ BACK BY AND VERIFIED WITH: Salome Holmes PHARMD 1510 07/13/19 A BROWNING    Culture (A)  Final    STAPHYLOCOCCUS AUREUS SUSCEPTIBILITIES PERFORMED ON PREVIOUS CULTURE WITHIN THE LAST 5 DAYS. Performed at [facility], [place] [address] [place], [place] [address]    Report Status 07/15/2019 FINAL  Final  Blood Culture (routine x 2)     Status: Abnormal   Collection Time: 07/13/19  1:14 AM   Specimen: BLOOD  Result Value Ref Range Status   Specimen Description BLOOD LEFT ANTECUBITAL  Final   Special Requests   Final    BOTTLES DRAWN AEROBIC AND ANAEROBIC Blood Culture adequate volume   Culture  Setup Time   Final    GRAM POSITIVE COCCI IN CLUSTERS IN BOTH AEROBIC AND ANAEROBIC BOTTLES CRITICAL RESULT CALLED TO, READ BACK BY AND VERIFIED WITH: Salome Holmes [facility] [id number] 07/13/19 A BROWNING Performed at [facility], [place] [address], [place], [place] [address]    Culture METHICILLIN RESISTANT STAPHYLOCOCCUS AUREUS (A)  Final   Report Status 07/15/2019 FINAL  Final   Organism ID, Bacteria METHICILLIN RESISTANT STAPHYLOCOCCUS AUREUS  Final      Susceptibility   Methicillin resistant staphylococcus aureus - MIC*    CIPROFLOXACIN >=8 RESISTANT Resistant     ERYTHROMYCIN >=8 RESISTANT Resistant     GENTAMICIN <=0.5 SENSITIVE Sensitive     OXACILLIN >=4 RESISTANT Resistant     TETRACYCLINE <=1 SENSITIVE Sensitive     VANCOMYCIN <=0.5 SENSITIVE Sensitive     TRIMETH/SULFA <=10 SENSITIVE  Sensitive     CLINDAMYCIN <=0.25 SENSITIVE Sensitive     RIFAMPIN <=0.5 SENSITIVE Sensitive     Inducible Clindamycin NEGATIVE Sensitive     * METHICILLIN RESISTANT STAPHYLOCOCCUS AUREUS  Blood Culture ID Panel (Reflexed)     Status: Abnormal   Collection Time: 07/13/19  1:14 AM  Result Value Ref Range Status   Enterococcus species NOT DETECTED NOT DETECTED Final   Listeria monocytogenes NOT DETECTED NOT DETECTED Final   Staphylococcus species DETECTED (A) NOT DETECTED Final    Comment: CRITICAL RESULT CALLED TO, READ BACK BY AND  VERIFIED WITH: Salome Holmes [facility] [id number] 07/13/19 A BROWNING    Staphylococcus aureus (BCID) DETECTED (A) NOT DETECTED Final    Comment: Methicillin (oxacillin)-resistant Staphylococcus aureus (MRSA). MRSA is predictably resistant to beta-lactam antibiotics (except ceftaroline). Preferred therapy is vancomycin unless clinically contraindicated. Patient requires contact precautions if  hospitalized. CRITICAL RESULT CALLED TO, READ BACK BY AND VERIFIED WITH: Salome Holmes PHARMD [id number] 07/13/19 A BROWNING    Methicillin resistance DETECTED (A) NOT DETECTED Final    Comment: CRITICAL RESULT CALLED TO, READ BACK BY AND VERIFIED WITH: Salome Holmes PHARMD 1510 07/13/19 A BROWNING    Streptococcus species NOT DETECTED NOT DETECTED Final   Streptococcus agalactiae NOT DETECTED NOT DETECTED Final   Streptococcus pneumoniae NOT DETECTED NOT DETECTED Final   Streptococcus pyogenes NOT DETECTED NOT DETECTED Final   Acinetobacter baumannii NOT DETECTED NOT DETECTED Final   Enterobacteriaceae species NOT DETECTED NOT DETECTED Final   Enterobacter cloacae complex NOT DETECTED NOT DETECTED Final   Escherichia coli NOT DETECTED NOT DETECTED Final   Klebsiella oxytoca NOT DETECTED NOT DETECTED Final   Klebsiella pneumoniae NOT DETECTED NOT DETECTED Final   Proteus species NOT DETECTED NOT DETECTED Final   Serratia marcescens NOT DETECTED NOT DETECTED Final   Haemophilus influenzae NOT  DETECTED NOT DETECTED Final   Neisseria meningitidis NOT DETECTED NOT DETECTED Final   Pseudomonas aeruginosa NOT DETECTED NOT DETECTED Final   Candida albicans NOT DETECTED NOT DETECTED Final   Candida glabrata NOT DETECTED NOT DETECTED Final   Candida krusei NOT DETECTED NOT DETECTED Final   Candida parapsilosis NOT DETECTED NOT DETECTED Final   Candida tropicalis NOT DETECTED NOT DETECTED Final    Comment: Performed at [facility], [place]. [address] [place], [place] [address]  SARS CORONAVIRUS 2 (TAT 6-24 HRS) Nasopharyngeal Nasopharyngeal Swab     Status: None   Collection Time: 07/13/19  2:57 AM   Specimen: Nasopharyngeal Swab  Result Value Ref Range Status   SARS Coronavirus 2 NEGATIVE NEGATIVE Final    Comment: (NOTE) SARS-CoV-2 target nucleic acids are NOT DETECTED. The SARS-CoV-2 RNA is generally detectable in upper and lower respiratory specimens during the acute phase of infection. Negative results do not preclude SARS-CoV-2 infection, do not rule out co-infections with other pathogens, and should not be used as the sole basis for treatment or other patient management decisions. Negative results must be combined with clinical observations, patient history, and epidemiological information. The expected result is Negative. Fact Sheet for Patients: SugarRoll.be Fact Sheet for Healthcare Providers: https://www.woods-mathews.com/ This test is not yet approved or cleared by the [place] FDA and  has been authorized for detection and/or diagnosis of SARS-CoV-2 by FDA under an Emergency Use Authorization (EUA). This EUA will remain  in effect (meaning this test can be used) for the duration of the COVID-19 declaration under Section 56 4(b)(1) of the Act, 21 U.S.C. section 360bbb-3(b)(1), unless the authorization is terminated or revoked sooner. Performed at [facility], [place] [address] [place], [place] [address]     Respiratory Panel by RT PCR (Flu A&B, Covid) - Nasopharyngeal Swab     Status: None   Collection Time: 07/13/19  6:56 AM   Specimen: Nasopharyngeal Swab  Result Value Ref Range Status   SARS Coronavirus 2 by RT PCR NEGATIVE NEGATIVE Final    Comment: (NOTE) SARS-CoV-2 target nucleic acids are NOT DETECTED. The SARS-CoV-2 RNA is generally detectable in upper respiratoy specimens during the acute phase of infection. The lowest concentration of  SARS-CoV-2 viral copies this assay can detect is 131 copies/mL. A negative result does not preclude SARS-Cov-2 infection and should not be used as the sole basis for treatment or other patient management decisions. A negative result may occur with  improper specimen collection/handling, submission of specimen other than nasopharyngeal swab, presence of viral mutation(s) within the areas targeted by this assay, and inadequate number of viral copies (<131 copies/mL). A negative result must be combined with clinical observations, patient history, and epidemiological information. The expected result is Negative. Fact Sheet for Patients:  PinkCheek.be Fact Sheet for Healthcare Providers:  GravelBags.it This test is not yet ap proved or cleared by the [place] FDA and  has been authorized for detection and/or diagnosis of SARS-CoV-2 by FDA under an Emergency Use Authorization (EUA). This EUA will remain  in effect (meaning this test can be used) for the duration of the COVID-19 declaration under Section 564(b)(1) of the Act, 21 U.S.C. section 360bbb-3(b)(1), unless the authorization is terminated or revoked sooner.    Influenza A by PCR NEGATIVE NEGATIVE Final   Influenza B by PCR NEGATIVE NEGATIVE Final    Comment: (NOTE) The Xpert Xpress SARS-CoV-2/FLU/RSV assay is intended as an aid in  the diagnosis of influenza from Nasopharyngeal swab specimens and  should not be used as a sole  basis for treatment. Nasal washings and  aspirates are unacceptable for Xpert Xpress SARS-CoV-2/FLU/RSV  testing. Fact Sheet for Patients: PinkCheek.be Fact Sheet for Healthcare Providers: GravelBags.it This test is not yet approved or cleared by the [place] FDA and  has been authorized for detection and/or diagnosis of SARS-CoV-2 by  FDA under an Emergency Use Authorization (EUA). This EUA will remain  in effect (meaning this test can be used) for the duration of the  Covid-19 declaration under Section 564(b)(1) of the Act, 21  U.S.C. section 360bbb-3(b)(1), unless the authorization is  terminated or revoked. Performed at [facility], [place] [address], [place], [place] [address]   Urine culture     Status: None   Collection Time: 07/13/19  8:18 AM   Specimen: In/Out Cath Urine  Result Value Ref Range Status   Specimen Description IN/OUT CATH URINE  Final   Special Requests NONE  Final   Culture   Final    NO GROWTH Performed at [facility], [place] [address], [place], [place] [address]    Report Status 07/14/2019 FINAL  Final  MRSA PCR Screening     Status: Abnormal   Collection Time: 07/13/19 10:59 AM   Specimen: Nasal Mucosa; Nasopharyngeal  Result Value Ref Range Status   MRSA by PCR POSITIVE (A) NEGATIVE Final    Comment:        The GeneXpert MRSA Assay (FDA approved for NASAL specimens only), is one component of a comprehensive MRSA colonization surveillance program. It is not intended to diagnose MRSA infection nor to guide or monitor treatment for MRSA infections. RESULT CALLED TO, READ BACK BY AND VERIFIED WITH: B. Adonis Brook RN 13:15 07/13/19 ([email or web address]) Performed at [facility], [place] [address] [place], [place] [address]   Body fluid culture     Status: None   Collection Time: 07/13/19  1:29 PM   Specimen: Synovium; Body Fluid  Result Value Ref Range Status   Specimen Description  SYNOVIAL KNEE LEFT  Final   Special Requests NONE  Final   Gram Stain   Final    ABUNDANT WBC PRESENT, PREDOMINANTLY MONONUCLEAR ABUNDANT GRAM POSITIVE COCCI  Performed at [facility], [place] [address], [place], [place] [address]    Culture   Final    ABUNDANT METHICILLIN RESISTANT STAPHYLOCOCCUS AUREUS   Report Status 07/15/2019 FINAL  Final   Organism ID, Bacteria METHICILLIN RESISTANT STAPHYLOCOCCUS AUREUS  Final      Susceptibility   Methicillin resistant staphylococcus aureus - MIC*    CIPROFLOXACIN >=8 RESISTANT Resistant     ERYTHROMYCIN >=8 RESISTANT Resistant     GENTAMICIN <=0.5 SENSITIVE Sensitive     OXACILLIN >=4 RESISTANT Resistant     TETRACYCLINE <=1 SENSITIVE Sensitive     VANCOMYCIN <=0.5 SENSITIVE Sensitive     TRIMETH/SULFA <=10 SENSITIVE Sensitive     CLINDAMYCIN <=0.25 SENSITIVE Sensitive     RIFAMPIN <=0.5 SENSITIVE Sensitive     Inducible Clindamycin NEGATIVE Sensitive     * ABUNDANT METHICILLIN RESISTANT STAPHYLOCOCCUS AUREUS  Group A Strep by PCR     Status: None   Collection Time: 07/13/19  4:08 PM   Specimen: Throat; Sterile Swab  Result Value Ref Range Status   Group A Strep by PCR NOT DETECTED NOT DETECTED Final    Comment: Performed at [facility], [place] [address] [place], [place] [address]  Culture, blood (routine x 2)     Status: Abnormal   Collection Time: 07/13/19  5:14 PM   Specimen: BLOOD RIGHT HAND  Result Value Ref Range Status   Specimen Description BLOOD RIGHT HAND  Final   Special Requests   Final    BOTTLES DRAWN AEROBIC ONLY Blood Culture adequate volume   Culture  Setup Time   Final    GRAM POSITIVE COCCI AEROBIC BOTTLE ONLY CRITICAL VALUE NOTED.  VALUE IS CONSISTENT WITH PREVIOUSLY REPORTED AND CALLED VALUE.    Culture (A)  Final    STAPHYLOCOCCUS AUREUS SUSCEPTIBILITIES PERFORMED ON PREVIOUS CULTURE WITHIN THE LAST 5 DAYS. Performed at [facility], [place] [address], [place], [place] [address]    Report  Status 07/17/2019 FINAL  Final  Culture, blood (routine x 2)     Status: Abnormal   Collection Time: 07/13/19  5:14 PM   Specimen: BLOOD RIGHT HAND  Result Value Ref Range Status   Specimen Description BLOOD RIGHT HAND  Final   Special Requests   Final    BOTTLES DRAWN AEROBIC ONLY Blood Culture adequate volume   Culture  Setup Time   Final    GRAM POSITIVE COCCI AEROBIC BOTTLE ONLY CRITICAL VALUE NOTED.  VALUE IS CONSISTENT WITH PREVIOUSLY REPORTED AND CALLED VALUE.    Culture (A)  Final    STAPHYLOCOCCUS AUREUS SUSCEPTIBILITIES PERFORMED ON PREVIOUS CULTURE WITHIN THE LAST 5 DAYS. Performed at [facility], [place] [address], [place],  [address]    Report Status 07/18/2019 FINAL  Final  Culture, blood (routine x 2)     Status: Abnormal   Collection Time: 07/14/19  9:50 AM   Specimen: BLOOD  Result Value Ref Range Status   Specimen Description BLOOD RIGHT ANTECUBITAL  Final   Special Requests   Final    BOTTLES DRAWN AEROBIC ONLY Blood Culture adequate volume   Culture  Setup Time   Final    GRAM POSITIVE COCCI AEROBIC BOTTLE ONLY CRITICAL VALUE NOTED.  VALUE IS CONSISTENT WITH PREVIOUSLY REPORTED AND CALLED VALUE.    Culture (A)  Final    STAPHYLOCOCCUS AUREUS SUSCEPTIBILITIES PERFORMED ON PREVIOUS CULTURE WITHIN THE LAST 5 DAYS. Performed at [facility], [place] [address]  [place], [place] [address]    Report Status 07/17/2019 FINAL  Final  Culture, blood (routine x 2)     Status: None   Collection Time: 07/14/19  9:56 AM   Specimen: BLOOD RIGHT HAND  Result Value Ref Range Status   Specimen Description BLOOD RIGHT HAND  Final   Special Requests   Final    BOTTLES DRAWN AEROBIC ONLY Blood Culture results may not be optimal due to an inadequate volume of blood received in culture bottles   Culture   Final    NO GROWTH 5 DAYS Performed at [facility], [place] [address] [place], [place] [address]    Report Status 07/19/2019 FINAL  Final  Culture,  blood (routine x 2)     Status: None (Preliminary result)   Collection Time: 07/15/19 12:21 PM   Specimen: BLOOD RIGHT HAND  Result Value Ref Range Status   Specimen Description BLOOD RIGHT HAND  Final   Special Requests   Final    BOTTLES DRAWN AEROBIC ONLY Blood Culture results may not be optimal due to an inadequate volume of blood received in culture bottles   Culture   Final    NO GROWTH 4 DAYS Performed at [facility], [place] [address] [place], [place] [address]    Report Status PENDING  Incomplete  Culture, blood (routine x 2)     Status: None (Preliminary result)   Collection Time: 07/15/19  1:20 PM   Specimen: BLOOD RIGHT ARM  Result Value Ref Range Status   Specimen Description BLOOD RIGHT ARM  Final   Special Requests   Final    BOTTLES DRAWN AEROBIC ONLY Blood Culture adequate volume   Culture   Final    NO GROWTH 4 DAYS Performed at [facility], [address], [place], [place] [address]    Report Status PENDING  Incomplete    Studies/Results: MR Lumbar Spine W Wo Contrast  Result Date: 07/18/2019 CLINICAL DATA:  Back pain. Sepsis. EXAM: MRI LUMBAR SPINE WITHOUT AND WITH CONTRAST TECHNIQUE: Multiplanar and multiecho pulse sequences of the lumbar spine were obtained without and with intravenous contrast. CONTRAST:  66mL GADAVIST GADOBUTROL 1 MMOL/ML IV SOLN COMPARISON:  None. FINDINGS: Segmentation:  Standard. Alignment:  Physiologic. Vertebrae: There is diffuse mottled low signal throughout the bones of the spine and sacrum. The patient is anemic and this may represent reactivated red marrow. Does the patient have any other hematologic disorders? No fractures or bone destruction. There is a small amount of fluid in the L4-5 and L5-S1 disc spaces. No findings suggestive of osteomyelitis. Conus medullaris and cauda equina: Conus extends to the T12-L1 level. Conus and cauda equina appear normal. Paraspinal and other soft tissues: Negative. Disc levels: L1-2:  Normal disc. Slight hypertrophy of the facet joints. L2-3: Tiny central disc bulge with no neural impingement. Slight hypertrophy of the facet joints and ligamentum flavum without neural impingement. L3-4: Tiny central disc bulge with an annular fissure. No neural impingement. Facet joints are normal. L4-5: Chronic degenerative changes of the vertebral endplates. Tiny amount of fluid in the disc space. I doubt that this represents discitis. No significant enhancement after contrast administration. There is a small broad-based disc bulge with accompanying osteophytes extending into the right neural foramen without neural impingement. Slight hypertrophy of the right ligamentum flavum. Facet joints are otherwise normal. L5-S1: Tiny amount of fluid in the left anterolateral aspect of the disc space, likely degenerative in origin. No appreciable enhancement after contrast  administration. Tiny broad-based disc bulge without neural impingement. No foraminal stenosis. No facet arthritis. IMPRESSION: 1. No discrete osteomyelitis or discitis in the lumbar spine. Small amounts of fluid in the L4-5 and L5-S1 disc spaces are likely degenerative in origin. 2. Multilevel degenerative disc disease without neural impingement. 3. Diffuse mottled low signal throughout the bones of the spine and sacrum which may represent reactivated red marrow. The patient is anemic. Electronically Signed   By: Lorriane Shire M.D.   On: 07/18/2019 18:34      Assessment/Plan:  INTERVAL HISTORY: MRI without evidence of infection   Principal Problem:   MRSA bacteremia Active Problems:   Septic joint ([place])   Atrial fibrillation with rapid ventricular response (HCC)   AKI (acute kidney injury) ([place])   Hyponatremia   Severe sepsis (HCC)   Severe sepsis with acute organ dysfunction (HCC)   Respiratory insufficiency   Acute pain of left knee   Prosthetic joint infection (HCC)   Tachypnea   Acute bilateral low back pain without  sciatica    SANTOS DELOZA is a 58 y.o. 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  #1         Antimicrobial Management Team Staphylococcus aureus bacteremia   Staphylococcus aureus bacteremia (SAB) is associated with a high rate of complications and mortality.  Specific aspects of clinical management are critical to optimizing the outcome of patients with SAB.  Therefore, the [facility] Antimicrobial Management Team [facility]) has initiated an intervention aimed at improving the management of SAB at [facility].  To do so, Infectious Diseases physicians are providing an evidence-based consult for the management of all patients with SAB.     Yes No Comments  Perform follow-up blood cultures (even if the patient is afebrile) to ensure clearance of bacteremia [x]  []   I repeated blood cultures after his midline was removed and no growth at 4 days  Remove vascular catheter and obtain follow-up blood cultures after the removal of the catheter []  []   he has a new central line that was placed on Saturday hopefully after bacteremia was cleared  Perform echocardiography to evaluate for endocarditis (transthoracic ECHO is 40-50% sensitive, TEE is > 90% sensitive) []  []   he is going to have a transesophageal echocardiogram at some point but currently not able to consent  Consult electrophysiologist to evaluate implanted cardiac device (pacemaker, ICD) []  []    Ensure source control []  []  Have all abscesses been drained effectively? Have deep seeded infections (septic joints or osteomyelitis) had appropriate surgical debridement?  Knee seems to have been the source  Investigate for "metastatic" sites of infection []  []  Does the patient have ANY symptom or physical exam finding that would suggest a deeper infection (back or neck pain that may be suggestive  of vertebral osteomyelitis or epidural abscess, muscle pain that could be a symptom of pyomyositis)?  Keep in mind that for deep seeded infections MRI imaging with contrast is preferred rather than other often insensitive tests such as plain x-rays, especially early in a patient's presentation.  I am ordering MRI of the lumbar spine  Change antibiotic therapy to vancomcyin  (challenge) []  []  Beta-lactam antibiotics are preferred for MSSA due to higher cure rates.   If on Vancomycin, goal trough should be 15 - 20 mcg/mL  Estimated duration of IV antibiotic therapy: 6 weeks followed by 5  to 6 months of oral antibiotics given prosthetic joint infection []  []  Consult case management for probably prolonged outpatient IV antibiotic therapy   #2 prosthetic joint infection: We will give him at least 6 months of therapy normally would add rifampin as an adjunct of drug but with his alcoholism never going to do that.  3.  Low back pain  MRI lumbar spine negative  4  Atrial fibrillation cardiology was considering DC cardioversion but now with rate control.  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   LOS: 6 days   Alcide Evener 07/19/2019, 12:55 PM

## 2019-07-19 NOTE — Progress Notes (Signed)
Pharmacy Antibiotic Note  Vernon Frye is a 58 y.o. male admitted on 07/12/2019 with MRSA bacteremia and left septic knee infection.   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 He has been on Daptomycin for this reason. Pharmacy has now been consulted to re-trial Vancomycin at a slower rate to assess patient tolerance since kidney function has improved.   SCr - 1.09 with estimated CrCl ~ 100 ml/min. Since patient is clearing his blood cultures and his been on therapy will not re-load with Vancomycin at this time.   Plan: Vancomycin 1000 mg every 12 hours  AUC 454 with Scr- 1.09 and Vd- 0.5 for BMI > 30 Will infuse over 3 hours to alleviate Red Man's  Monitor s/sx of reaction, kidney function   Height: 6\' 3"  (190.5 cm) Weight: 249 lb 12.5 oz (113.3 kg) IBW/kg (Calculated) : 84.5  Temp (24hrs), Avg:98.7 F (37.1 C), Min:98 F (36.7 C), Max:99.1 F (37.3 C)  Recent Labs  Lab 07/12/19 1910 07/13/19 0119 07/13/19 0335 07/13/19 0431 07/13/19 0513 07/14/19 0600 07/14/19 2357 07/16/19 0344 07/17/19 0503 07/17/19 1539 07/18/19 0233 07/19/19 0527  WBC 16.0*  --   --  12.9*  --  16.4* 19.9* 19.0* 21.2*  --  19.1*  --   CREATININE 3.36*  --   --  3.08*  --  2.42*  --   --   --  1.25* 1.00 1.09  LATICACIDVEN 4.2* 3.6* 3.3*  --  2.4*  --   --   --   --   --   --   --     Estimated Creatinine Clearance: 100.3 mL/min (by C-G formula based on SCr of 1.09 mg/dL).    Allergies  Allergen Reactions  . Vancomycin Shortness Of Breath and Rash    [name]       Thank you for allowing pharmacy to be a part of this patient's care.  Jimmy Footman, PharmD, BCPS, [facility] Clinical Pharmacist Phone: [phone] Please check AMION for all [facility] [facility] phone numbers After 10:00 PM, call [place] [phone] 07/19/2019 11:48 AM

## 2019-07-19 NOTE — Progress Notes (Addendum)
NAME:  Vernon Frye, MRN:  [id number], DOB:  07-Dec-1960, LOS: 6 ADMISSION DATE:  07/12/2019, CONSULTATION DATE:  07/13/19 REFERRING MD:  [place] [facility], CHIEF COMPLAINT:  L knee pain  Brief History   58 yo M presents with L knee pair, prior L TKR   History of present illness   58 yo M PMH Afib, HTN, L TKR (2016) who presented to ED with CC L knee pain. Pain began 12/14 and has progressed prompting presentation. 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 Desaturated with hypoxemic respiratory failure requiring 3LNC.  PCCM consulted for admission.  Went for knee washout and abx spacer placement on 12/16.   Past Medical History  A fib HTN Anxiety L TKR  L shoulder arthroscopy  DDD  Significant Hospital Events   12/16 presents to ED for L knee pain. Concern for septic joint. A fiv RVR. Developed SOB after vanc administration. PCCM consulted for admission  Consults:  Ortho   Procedures:  12/16 left knee Irrigation and debridement, total knee with poly exchange    Significant Diagnostic Tests:  12/16 ECHO >  12/16 CXR > no effusion, no pneumothorax, possible mild edema  12/15 L knee XR > prior L knee replacement. Moderate joint effusion, no body abnormality  12/16 Renal ultrasound > Mild left-sided hydronephrosis is noted. 12/21 MRI lumbar spine DJD, no neural impingement, no discitis Micro Data:  12/16 SARS Cov2 >negative  12/16 BCx > positive Staph MRSA  12/16 UCx > 12/16 Strep A > Negative  12/16 Flu A/B > neg 12/16 MRSA PCR > positive  12/16 Body culture left knee > Abundant gram positive cocci   Antimicrobials:  12/16 vanc x1 12/16 ceftriaxone x1 12/16 Zosyn > 12/16 Daptomycin 12/17 >  Interim history/subjective:  No overnight issues. He is awake alert and following commands asking for a soda. Still in AF.  Objective   Blood pressure (!) 139/100, pulse (!) 119, temperature 99 F (37.2  C), temperature source Axillary, resp. rate (!) 39, height 6\' 3"  (1.905 m), weight 113.3 kg, SpO2 98 %.        Intake/Output Summary (Last 24 hours) at 07/19/2019 [id number] Last data filed at 07/19/2019 0800 Gross per 24 hour  Intake 1071.75 ml  Output 2070 ml  Net -998.25 ml   Filed Weights   07/16/19 0500 07/18/19 0449 07/19/19 0429  Weight: 120.8 kg 113.8 kg 113.3 kg    Examination: General: Adult male lying in bed, in NAD, resting comfortably HEENT: [place]/AT, MM pink/moist, PERRL,  Neuro: awake alert, answers questions CV: irregularly irregular, HR in 120s, no mrg PULM:  Clear to ascultation bilaterally, no added breath sounds GI: soft, bowel sounds active in all 4 quadrants, non-tender, non-distended Extremities: warm/dry, 2+ pitting lower extremity edema  Skin: no rashes or lesions  Resolved Hospital Problem list     Assessment & Plan:  Acute encephalopathy secondary to agitated delirium - multifactorial could also be from polysubstance abuse or etoh withdrawal.  P - continue seroquel scheduled - improved. Off precedex. Prn ativan for CIWA can likely be d/ced in the next day - also has prn haldol available.   MRSA Bacteremia  -In the setting of septic joint  -MRSA PCR positive  - with severe sepsis P ID following, appreciate assistance Continue Daptomycin - had redman's syndrome with vanco - pharmacy to retrial today with slower infusion. TTE negative for vegetation. He will need a TEE.  Cultures have now cleared.  PICC line for IV abx is planned eventually.  MRI negative for spinal abscess or osteomyelitis.   Afib RVR -non-compliant w eliquis outpatient P Been on diltiazem gtt, resumed home metoprolol Adding po diltiazem today in addition to metoprolol scheduled and po. HR better controlled Continue lovenox treatment dose.   Left Knee Septic joint -suspect septic joint in setting of L TKR in 2016 -Underwent I&D with partial hardwear exchange 12/16 P Left knee  culture results with abundant gram positive cocci  Ortho following, appreciate assistance  IV antibiotics as above   AKI -Hx kidney stones  -Hx urinary retention  -Renal ultrasound with mild left-sided hydronephrosis P Follow renal function / urine output Trend Bmet Avoid nephrotoxins, ensure adequate renal perfusion  IV hydration  Acute Respiratory Insufficiency with hypoxia -following fluid bolus in ED, crackles on exam, most suspicious for pulm edema -Covid neg , Flu A/B neg  -low suspicion for PE - on miminal nasal cannula now, has been titrated down.  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 Substance Use  -denies substance use; UDS pos for Amphetamines, BZD, Opioids (Home opioids for pain, BZD for anxiety)  P Continue Vitamin B supplementation  On seroquel and haldol for now Monitor for etoh withdrawal  Hematuria - was on high doses of heparin due to not being able to check therapeutic levels without IV access. Now on lovenox. Hematuria was single episode, now resolved.  P - continue AC for now.  -Will monitor for clinical worsening or worsening anemia.   Best practice:  Diet: advancing diet po per speech, currently dysphagia. Taking pills Pain/Anxiety/Delirium protocol (if indicated): na VAP protocol (if indicated): na DVT prophylaxis: lovenox GI prophylaxis: pepcid  Glucose control: monitor Mobility: OOB with PT/OT Code Status: Full  Family Communication: Patient updated. He has a friend who occasional calls in for updates. Not been able to reach any family.  Disposition: ICU  The patient is critically ill with multiple organ systems failure and requires high complexity decision making for assessment and support, frequent evaluation and titration of therapies, application of advanced monitoring technologies and extensive interpretation of multiple databases.   Critical Care Time devoted to patient care services described in this note is 37 minutes. This time  reflects time of care of this [place] . This critical care time does not reflect separately billable procedures or procedure time, teaching time or supervisory time of PA/NP/Med student/Med Resident etc but could involve care discussion time.  [facility] 07/19/2019 8:32 AM  Pager: [phone] After hours pager: [phone]    Labs   CBC: Recent Labs  Lab 07/12/19 1910 07/14/19 0600 07/14/19 2357 07/16/19 0344 07/17/19 0503 07/18/19 0233  WBC 16.0* 16.4* 19.9* 19.0* 21.2* 19.1*  NEUTROABS 13.5*  --   --   --   --   --   HGB 13.3 11.1* 10.8* 11.0* 12.1* 11.1*  HCT 40.3 32.5* 32.2* 32.8* 37.0* 32.6*  MCV 88.6 86.4 87.5 88.6 88.9 85.8  PLT 227 162 197 127* 192 0000000    Basic Metabolic Panel: Recent Labs  Lab 07/13/19 0431 07/13/19 2357 07/14/19 0600 07/17/19 1539 07/18/19 0233 07/19/19 0527  NA 130* 130* 132* 142 143 142  K 3.8 3.9 4.4 3.5 3.9 3.9  CL 95*  --  98 110 109 109  CO2 21*  --  19* 21* 22 20*  GLUCOSE 132*  --  174* 186* 167* 156*  BUN 44*  --  55* 46* 38* 31*  CREATININE 3.08*  --  2.42* 1.25* 1.00 1.09  CALCIUM 8.1*  --  7.6* 7.8* 7.9* 7.7*  MG 1.0*  --  2.1  --   --   --   PHOS 3.1  --  3.5  --   --   --    GFR: Estimated Creatinine Clearance: 100.3 mL/min (by C-G formula based on SCr of 1.09 mg/dL). Recent Labs  Lab 07/12/19 1910 07/13/19 0119 07/13/19 0335 07/13/19 0431 07/13/19 0513 07/14/19 2357 07/16/19 0344 07/17/19 0503 07/18/19 0233  PROCALCITON  --   --   --  8.28  --   --   --   --   --   WBC 16.0*  --   --  12.9*  --  19.9* 19.0* 21.2* 19.1*  LATICACIDVEN 4.2* 3.6* 3.3*  --  2.4*  --   --   --   --     Liver Function Tests: Recent Labs  Lab 07/12/19 1910 07/13/19 0431  AST 29 24  ALT 17 14  ALKPHOS 94 68  BILITOT 1.9* 1.6*  PROT 6.9 5.3*  ALBUMIN 2.5* 1.9*   No results for input(s): LIPASE, AMYLASE in the last 168 hours. No results for input(s): AMMONIA in  the last 168 hours.  ABG    Component Value Date/Time   PHART 7.355 07/13/2019 2357   PCO2ART 35.9 07/13/2019 2357   PO2ART 72.0 (L) 07/13/2019 2357   HCO3 20.2 07/13/2019 2357   TCO2 21 (L) 07/13/2019 2357   ACIDBASEDEF 5.0 (H) 07/13/2019 2357   O2SAT 94.0 07/13/2019 2357     Coagulation Profile: Recent Labs  Lab 07/13/19 0117  INR 1.5*    Cardiac Enzymes: Recent Labs  Lab 07/14/19 0600  CKTOTAL 126    HbA1C: Hgb A1c MFr Bld  Date/Time Value Ref Range Status  07/13/2019 04:08 PM 6.3 (H) 4.8 - 5.6 % Final    Comment:    (NOTE) Pre diabetes:          5.7%-6.4% Diabetes:              >6.4% Glycemic control for   <7.0% adults with diabetes     CBG: Recent Labs  Lab 07/13/19 1058 07/13/19 2309 07/14/19 1059  GLUCAP 79 141* 164*

## 2019-07-19 NOTE — Progress Notes (Signed)
Progress Note  Patient Name: Vernon Frye Date of Encounter: 07/19/2019  Primary Cardiologist: Sinclair Grooms, MD  Subjective   Still confused some but much better today.  He tells me he is going home before Christmas.  Remains in afib with HR in the 115's.   Inpatient Medications    Scheduled Meds: . Chlorhexidine Gluconate Cloth  6 each Topical Daily  . docusate sodium  100 mg Oral BID  . enoxaparin (LOVENOX) injection  120 mg Subcutaneous Q12H  . folic acid  1 mg Oral Daily  . metoprolol succinate  200 mg Oral Daily  . multivitamin with minerals  1 tablet Oral Daily  . QUEtiapine  25 mg Oral BID  . thiamine  100 mg Oral Daily   Or  . thiamine  100 mg Intravenous Daily   Continuous Infusions: . sodium chloride Stopped (07/18/19 1941)  . DAPTOmycin (CUBICIN)  IV Stopped (07/18/19 2011)  . dexmedetomidine (PRECEDEX) IV infusion 0.7 mcg/kg/hr (07/19/19 0600)  . diltiazem (CARDIZEM) infusion Stopped (07/18/19 1106)   PRN Meds: diphenhydrAMINE, haloperidol lactate, LORazepam, menthol-cetylpyridinium **OR** phenol, metoprolol tartrate, ondansetron **OR** ondansetron (ZOFRAN) IV, oxyCODONE, Resource ThickenUp Clear, sodium chloride flush   Vital Signs    Vitals:   07/19/19 0429 07/19/19 0500 07/19/19 0600 07/19/19 0700  BP:  (!) 154/106 (!) 146/99   Pulse:  (!) 101 (!) 111   Resp:  (!) 40 (!) 42   Temp:    99 F (37.2 C)  TempSrc:    Axillary  SpO2:  97% 99%   Weight: 113.3 kg     Height:        Intake/Output Summary (Last 24 hours) at 07/19/2019 0729 Last data filed at 07/19/2019 0600 Gross per 24 hour  Intake 1265.6 ml  Output 1920 ml  Net -654.4 ml   Last 3 Weights 07/19/2019 07/18/2019 07/16/2019  Weight (lbs) 249 lb 12.5 oz 250 lb 14.1 oz 266 lb 5.1 oz  Weight (kg) 113.3 kg 113.8 kg 120.8 kg      Telemetry    Atrial fibrillation with RVR - Personally Reviewed  ECG    No new EKG to review Personally Reviewed  Physical Exam   GEN: Well  nourished, well developed in no acute distress HEENT: Normal NECK: No JVD; No carotid bruits LYMPHATICS: No lymphadenopathy CARDIAC:irregularly irregular and tachy, no murmurs, rubs, gallops RESPIRATORY:  Clear to auscultation without rales, wheezing or rhonchi  ABDOMEN: Soft, non-tender, non-distended MUSCULOSKELETAL:  No edema; No deformity  SKIN: Warm and dry NEUROLOGIC:  A&O x 3 PSYCHIATRIC:  somewhat confused but improved   Labs    High Sensitivity Troponin:  No results for input(s): TROPONINIHS in the last 720 hours.    Chemistry Recent Labs  Lab 07/12/19 1910 07/13/19 0431 07/13/19 2357 07/17/19 1539 07/18/19 0233 07/19/19 0527  NA 130* 130*  --  142 143 142  K 3.6 3.8  --  3.5 3.9 3.9  CL 91* 95*   < > 110 109 109  CO2 22 21*   < > 21* 22 20*  GLUCOSE 122* 132*   < > 186* 167* 156*  BUN 43* 44*   < > 46* 38* 31*  CREATININE 3.36* 3.08*   < > 1.25* 1.00 1.09  CALCIUM 8.7* 8.1*   < > 7.8* 7.9* 7.7*  PROT 6.9 5.3*  --   --   --   --   ALBUMIN 2.5* 1.9*  --   --   --   --  AST 29 24  --   --   --   --   ALT 17 14  --   --   --   --   ALKPHOS 94 68  --   --   --   --   BILITOT 1.9* 1.6*  --   --   --   --   GFRNONAA 19* 21*   < > >60 >60 >60  GFRAA 22* 25*   < > >60 >60 >60  ANIONGAP 17* 14   < > 11 12 13    < > = values in this interval not displayed.     Hematology Recent Labs  Lab 07/16/19 0344 07/17/19 0503 07/18/19 0233  WBC 19.0* 21.2* 19.1*  RBC 3.70* 4.16* 3.80*  HGB 11.0* 12.1* 11.1*  HCT 32.8* 37.0* 32.6*  MCV 88.6 88.9 85.8  MCH 29.7 29.1 29.2  MCHC 33.5 32.7 34.0  RDW 15.3 14.9 14.6  PLT 127* 192 176    BNP Recent Labs  Lab 07/13/19 0431  BNP 299.7*     DDimer  Recent Labs  Lab 07/13/19 1608  DDIMER 3.78*     Radiology    MR Lumbar Spine W Wo Contrast  Result Date: 07/18/2019 CLINICAL DATA:  Back pain. Sepsis. EXAM: MRI LUMBAR SPINE WITHOUT AND WITH CONTRAST TECHNIQUE: Multiplanar and multiecho pulse sequences of the  lumbar spine were obtained without and with intravenous contrast. CONTRAST:  44mL GADAVIST GADOBUTROL 1 MMOL/ML IV SOLN COMPARISON:  None. FINDINGS: Segmentation:  Standard. Alignment:  Physiologic. Vertebrae: There is diffuse mottled low signal throughout the bones of the spine and sacrum. The patient is anemic and this may represent reactivated red marrow. Does the patient have any other hematologic disorders? No fractures or bone destruction. There is a small amount of fluid in the L4-5 and L5-S1 disc spaces. No findings suggestive of osteomyelitis. Conus medullaris and cauda equina: Conus extends to the T12-L1 level. Conus and cauda equina appear normal. Paraspinal and other soft tissues: Negative. Disc levels: L1-2: Normal disc. Slight hypertrophy of the facet joints. L2-3: Tiny central disc bulge with no neural impingement. Slight hypertrophy of the facet joints and ligamentum flavum without neural impingement. L3-4: Tiny central disc bulge with an annular fissure. No neural impingement. Facet joints are normal. L4-5: Chronic degenerative changes of the vertebral endplates. Tiny amount of fluid in the disc space. I doubt that this represents discitis. No significant enhancement after contrast administration. There is a small broad-based disc bulge with accompanying osteophytes extending into the right neural foramen without neural impingement. Slight hypertrophy of the right ligamentum flavum. Facet joints are otherwise normal. L5-S1: Tiny amount of fluid in the left anterolateral aspect of the disc space, likely degenerative in origin. No appreciable enhancement after contrast administration. Tiny broad-based disc bulge without neural impingement. No foraminal stenosis. No facet arthritis. IMPRESSION: 1. No discrete osteomyelitis or discitis in the lumbar spine. Small amounts of fluid in the L4-5 and L5-S1 disc spaces are likely degenerative in origin. 2. Multilevel degenerative disc disease without neural  impingement. 3. Diffuse mottled low signal throughout the bones of the spine and sacrum which may represent reactivated red marrow. The patient is anemic. Electronically Signed   By: Lorriane Shire M.D.   On: 07/18/2019 18:34    Cardiac Studies   2D echo 06/2019 IMPRESSIONS    1. Left ventricular ejection fraction, by visual estimation, is 55 to 60%. The left ventricle has normal function. There is mildly increased left ventricular  hypertrophy.  2. Left ventricular diastolic function could not be evaluated.  3. Right ventricular volume/pressure overload.  4. The left ventricle demonstrates regional wall motion abnormalities.  5. Global right ventricle has severely reduced systolic function.The right ventricular size is severely enlarged. No increase in right ventricular wall thickness.  6. Left atrial size was normal.  7. Right atrial size was mild-moderately dilated.  8. Presence of pericardial fat pad.  9. Trivial pericardial effusion is present. 10. Mild mitral annular calcification. 11. The mitral valve is degenerative. Trivial mitral valve regurgitation. 12. The tricuspid valve is grossly normal. Tricuspid valve regurgitation is trivial. 13. The aortic valve is tricuspid. Aortic valve regurgitation is not visualized. No evidence of aortic valve sclerosis or stenosis. 14. The pulmonic valve was grossly normal. Pulmonic valve regurgitation is not visualized. 15. TR signal is inadequate for assessing pulmonary artery systolic pressure. 16. The inferior vena cava is normal in size with greater than 50% respiratory variability, suggesting right atrial pressure of 3 mmHg. 17. No prior Echocardiogram.  Patient Profile     57 y.o. male with recent septic left knee (prosthetic joint infection), MRSA bacteremia. We are following due to post op atrial fibrillation with RVR  Assessment & Plan    Atrial fibrillation with RVR:  -likely exacerbated by sepsis and immediate postoperative  period -on heparin for anticoagulation. Notes suggest previously nonadherent to apixaban -received IV amiodarone post op then stopped -went back in to afib with RVR  and placed on Cardizem gtt -was supposed to change to PO cardizem yesterday but still on gtt -HR remains elevated so will titrate Cardizem gtt to keep HR controlled -continue therapeutic Lovenox -team discussed TEE/DCCV over the weekend since he needs TEE anyway to eval for endocarditis and patient who refused  -We discussed TEE/DCCV again today and he continues to refuse until he talks with his mother - I will call her and discuss procedure -he would benefit from TEE/DCCV since he already needs TEE to evaluate for endocarditis but I do not think that TEE/DCCV is urgent and would rather wait until his mental confusion clears more -continue Toprol XL 200mg  daily  -titrate Cardizem gtt to keep HR controlled - would not change to PO Cardizem until NSR has been restored  Sepsis, MRSA bacteremia:  -s/p irrigation and debridement, antibiotic spacer placement -ID following, on daptomycin -TTE as above.  -TEE when mental status is clear/prior to discharge  HTN -BP poorly controlled -Continue Toprol XL 200mg  daily -titrate Cardizem gtt  For questions or updates, please contact [facility] Please consult [email or web address] for contact info under     Signed, Fransico Him, MD  07/19/2019, 7:29 AM

## 2019-07-20 ENCOUNTER — Encounter (HOSPITAL_COMMUNITY): Payer: Self-pay | Admitting: Anesthesiology

## 2019-07-20 DIAGNOSIS — M7989 Other specified soft tissue disorders: Secondary | ICD-10-CM

## 2019-07-20 DIAGNOSIS — T8450XD Infection and inflammatory reaction due to unspecified internal joint prosthesis, subsequent encounter: Secondary | ICD-10-CM

## 2019-07-20 DIAGNOSIS — L270 Generalized skin eruption due to drugs and medicaments taken internally: Secondary | ICD-10-CM

## 2019-07-20 LAB — CULTURE, BLOOD (ROUTINE X 2)
Culture: NO GROWTH
Culture: NO GROWTH
Special Requests: ADEQUATE

## 2019-07-20 LAB — CBC WITH DIFFERENTIAL/PLATELET
Abs Immature Granulocytes: 1.44 10*3/uL — ABNORMAL HIGH (ref 0.00–0.07)
Basophils Absolute: 0.1 10*3/uL (ref 0.0–0.1)
Basophils Relative: 0 %
Eosinophils Absolute: 0 10*3/uL (ref 0.0–0.5)
Eosinophils Relative: 0 %
HCT: 33.4 % — ABNORMAL LOW (ref 39.0–52.0)
Hemoglobin: 11.1 g/dL — ABNORMAL LOW (ref 13.0–17.0)
Immature Granulocytes: 7 %
Lymphocytes Relative: 9 %
Lymphs Abs: 2 10*3/uL (ref 0.7–4.0)
MCH: 29.3 pg (ref 26.0–34.0)
MCHC: 33.2 g/dL (ref 30.0–36.0)
MCV: 88.1 fL (ref 80.0–100.0)
Monocytes Absolute: 0.9 10*3/uL (ref 0.1–1.0)
Monocytes Relative: 4 %
Neutro Abs: 16.7 10*3/uL — ABNORMAL HIGH (ref 1.7–7.7)
Neutrophils Relative %: 80 %
Platelets: 311 10*3/uL (ref 150–400)
RBC: 3.79 MIL/uL — ABNORMAL LOW (ref 4.22–5.81)
RDW: 15.1 % (ref 11.5–15.5)
WBC: 21.1 10*3/uL — ABNORMAL HIGH (ref 4.0–10.5)
nRBC: 0 % (ref 0.0–0.2)

## 2019-07-20 LAB — BASIC METABOLIC PANEL
Anion gap: 15 (ref 5–15)
BUN: 22 mg/dL — ABNORMAL HIGH (ref 6–20)
CO2: 20 mmol/L — ABNORMAL LOW (ref 22–32)
Calcium: 7.6 mg/dL — ABNORMAL LOW (ref 8.9–10.3)
Chloride: 105 mmol/L (ref 98–111)
Creatinine, Ser: 0.93 mg/dL (ref 0.61–1.24)
GFR calc Af Amer: >60 mL/min (ref >60)
GFR calc non Af Amer: >60 mL/min (ref >60)
Glucose, Bld: 169 mg/dL — ABNORMAL HIGH (ref 70–99)
Potassium: 3.9 mmol/L (ref 3.5–5.1)
Sodium: 140 mmol/L (ref 135–145)

## 2019-07-20 LAB — GLUCOSE, CAPILLARY
Glucose-Capillary: 151 mg/dL — ABNORMAL HIGH (ref 70–99)
Glucose-Capillary: 155 mg/dL — ABNORMAL HIGH (ref 70–99)
Glucose-Capillary: 172 mg/dL — ABNORMAL HIGH (ref 70–99)
Glucose-Capillary: 173 mg/dL — ABNORMAL HIGH (ref 70–99)
Glucose-Capillary: 192 mg/dL — ABNORMAL HIGH (ref 70–99)

## 2019-07-20 NOTE — Anesthesia Preprocedure Evaluation (Deleted)
Anesthesia Evaluation  Patient identified by MRN, date of birth, ID band Patient awake    Reviewed: Allergy & Precautions, H&P , NPO status , Patient's Chart, lab work & pertinent test results, reviewed documented beta blocker date and time   Airway        Dental no notable dental hx.    Pulmonary neg pulmonary ROS, former smoker,    Pulmonary exam normal        Cardiovascular hypertension, Pt. on medications and Pt. on home beta blockers + dysrhythmias Atrial Fibrillation      Neuro/Psych Anxiety negative neurological ROS     GI/Hepatic negative GI ROS, Neg liver ROS,   Endo/Other  negative endocrine ROS  Renal/GU negative Renal ROS  negative genitourinary   Musculoskeletal  (+) Arthritis , Osteoarthritis,    Abdominal   Peds  Hematology negative hematology ROS (+)   Anesthesia Other Findings   Reproductive/Obstetrics negative OB ROS                             Anesthesia Physical Anesthesia Plan  ASA: III  Anesthesia Plan: General   Post-op Pain Management:    Induction: Intravenous  PONV Risk Score and Plan: 2 and Propofol infusion and Treatment may vary due to age or medical condition  Airway Management Planned: Nasal Cannula  Additional Equipment:   Intra-op Plan:   Post-operative Plan:   Informed Consent: I have reviewed the patients History and Physical, chart, labs and discussed the procedure including the risks, benefits and alternatives for the proposed anesthesia with the patient or authorized representative who has indicated his/her understanding and acceptance.     Dental advisory given  Plan Discussed with: CRNA  Anesthesia Plan Comments:         Anesthesia Quick Evaluation

## 2019-07-20 NOTE — Progress Notes (Signed)
[facility] Progress Note Patient Name: AANSH NORDMANN DOB: 11/20/1960 MRN: [id number]   Date of Service  07/20/2019  HPI/Events of Note  Nursing concerned about dark red drainage from surgical site L knee which has saturated the dressing. Patient is currently on Vancomycin dosed by pharmacy.   eICU Interventions  Will order: 1. CBC STAT. 2. Bedside nurse instructed to call orthopedic surgeon on call for [name] who performed L TKA on 07/13/2019 to get further directions about care of the surgical wound drainage.     Intervention Category Major Interventions: Other:  Lysle Dingwall 07/20/2019, 1:59 AM

## 2019-07-20 NOTE — TOC Progression Note (Addendum)
Transition of Care [facility]) - Progression Note    Patient Details  Name: Vernon Frye MRN: [id number] Date of Birth: 09/21/1960  [facility]) CM/SW [place], [place] Phone Number: 07/20/2019, 3:59 PM  Clinical Narrative:     CSW visit with the patient at bedside. CSW introduced self and explained role. Patient states he lives in the home with his mother. Patient declined SNF. Patient states his mother can help him. Patient answered slowly and appeared to be confused and inquired if I knew about "someone was shooting all over this place". CSW inquired if PT recommendations can be shared with his mother. Patient states he prefers to talk with his mother first before [place] calls.   Patient is currently declining SNF.  CSW will continue to follow and assist with discharge planning.   Thurmond Butts, MSW, LCSWA Clinical Social Worker     Barriers to Discharge: Continued Medical Work up  [employer] and [facility]                                                 Social Determinants of Health (SDOH) Interventions    Readmission Risk Interventions No flowsheet data found.

## 2019-07-20 NOTE — Progress Notes (Signed)
SLP Cancellation Note  Patient Details Name: Vernon Frye MRN: [id number] DOB: [birth year]   Cancelled treatment:       Reason Eval/Treat Not Completed: Other (comment)(Pt currently NPO for procedure; unable to give POs)   Elvina Sidle, M.S., CCC-SLP 07/20/2019, 2:04 PM

## 2019-07-20 NOTE — Progress Notes (Signed)
     Subjective: 7 Days Post-Op Procedure(s) (LRB): IRRIGATION AND DEBRIDEMENT TOTAL  KNEE WITH POLY EXCHANGE (Left)   Patient reports pain as significant with drainage. I was told by nursing that the patient did have a fall onto his knees earlier in the day.  Patient appears to reacting appropriately.  Objective:   VITALS:   Vitals:   07/20/19 1300 07/20/19 1650  BP:  (!) 144/99  Pulse: (!) 114 98  Resp: (!) 23 (!) 38  Temp:  97.8 F (36.6 C)  SpO2: 97% 100%    Incision: significant drainage  Drainage appears to be blood with some purulence Pitting edema in the left LE Significant tenderness with movement of the left LE    LABS Recent Labs    07/18/19 0233 07/20/19 0222  HGB 11.1* 11.1*  HCT 32.6* 33.4*  WBC 19.1* 21.1*  PLT 176 311    Recent Labs    07/18/19 0233 07/19/19 0527 07/20/19 0222  NA 143 142 140  K 3.9 3.9 3.9  BUN 38* 31* 22*  CREATININE 1.00 1.09 0.93  GLUCOSE 167* 156* 169*     Assessment/Plan: 7 Days Post-Op Procedure(s) (LRB): IRRIGATION AND DEBRIDEMENT TOTAL  KNEE WITH POLY EXCHANGE (Left)   Discussed case with [name] At this time the dressing was removed and replaced with 4x4 gauze, multiple ABDs, Kerlix and 2 ACE bandages for compression. Plan is to reinforce dressing as needed [name] will formulate a plan on how to proceed with treatment regarding the left knee      West Pugh. Atonya Templer   PAC  07/20/2019, 7:29 PM

## 2019-07-20 NOTE — Progress Notes (Signed)
Progress Note  Patient Name: Vernon Frye Date of Encounter: 07/20/2019  Primary Cardiologist: Sinclair Grooms, MD   Subjective   Patient was noted to be in Afib RVR overnight with rates in the 120s. O2 levels stable. Patient consented for TEE/DCCV. Will try to schedule for today.   Inpatient Medications    Scheduled Meds: . Chlorhexidine Gluconate Cloth  6 each Topical Daily  . diltiazem  60 mg Oral Q6H  . docusate sodium  100 mg Oral BID  . enoxaparin (LOVENOX) injection  120 mg Subcutaneous Q12H  . folic acid  1 mg Oral Daily  . metoprolol succinate  200 mg Oral Daily  . multivitamin with minerals  1 tablet Oral Daily  . QUEtiapine  25 mg Oral BID  . thiamine  100 mg Oral Daily   Or  . thiamine  100 mg Intravenous Daily   Continuous Infusions: . sodium chloride 50 mL/hr at 07/19/19 1400  . vancomycin 1,000 mg (07/20/19 0244)   PRN Meds: diphenhydrAMINE, haloperidol lactate, LORazepam, menthol-cetylpyridinium **OR** phenol, metoprolol tartrate, ondansetron **OR** ondansetron (ZOFRAN) IV, oxyCODONE, Resource ThickenUp Clear, sodium chloride flush   Vital Signs    Vitals:   07/19/19 2117 07/20/19 0021 07/20/19 0447 07/20/19 0557  BP: (!) 156/97 (!) 159/98 (!) 160/106 (!) 159/103  Pulse: (!) 105 (!) 121 (!) 120   Resp:      Temp: 99 F (37.2 C) 98.2 F (36.8 C) 98.3 F (36.8 C)   TempSrc: Oral Oral Oral   SpO2: 99% 99% 98%   Weight:   116.8 kg   Height:        Intake/Output Summary (Last 24 hours) at 07/20/2019 [id number] Last data filed at 07/20/2019 0525 Gross per 24 hour  Intake 984.14 ml  Output 525 ml  Net 459.14 ml   Last 3 Weights 07/20/2019 07/19/2019 07/18/2019  Weight (lbs) 257 lb 8 oz 249 lb 12.5 oz 250 lb 14.1 oz  Weight (kg) 116.8 kg 113.3 kg 113.8 kg      Telemetry    Afib, RVR, HR 120-130, PVCs - Personally Reviewed  ECG    Pending - Personally Reviewed  Physical Exam   GEN: No acute distress.   Neck: No JVD Cardiac: Irreg  Irreg, no murmurs, rubs, or gallops.  Respiratory: Clear to auscultation bilaterally. GI: Firm, nontender, non-distended  MS: Moderate lower leg edema, L>R; No deformity. Neuro:  Nonfocal  Psych: Normal affect   Labs    High Sensitivity Troponin:  No results for input(s): TROPONINIHS in the last 720 hours.    Chemistry Recent Labs  Lab 07/18/19 0233 07/19/19 0527 07/20/19 0222  NA 143 142 140  K 3.9 3.9 3.9  CL 109 109 105  CO2 22 20* 20*  GLUCOSE 167* 156* 169*  BUN 38* 31* 22*  CREATININE 1.00 1.09 0.93  CALCIUM 7.9* 7.7* 7.6*  GFRNONAA >60 >60 >60  GFRAA >60 >60 >60  ANIONGAP 12 13 15      Hematology Recent Labs  Lab 07/17/19 0503 07/18/19 0233 07/20/19 0222  WBC 21.2* 19.1* 21.1*  RBC 4.16* 3.80* 3.79*  HGB 12.1* 11.1* 11.1*  HCT 37.0* 32.6* 33.4*  MCV 88.9 85.8 88.1  MCH 29.1 29.2 29.3  MCHC 32.7 34.0 33.2  RDW 14.9 14.6 15.1  PLT 192 176 311    BNPNo results for input(s): BNP, PROBNP in the last 168 hours.   DDimer  Recent Labs  Lab 07/13/19 1608  DDIMER 3.78*  Radiology    MR Lumbar Spine W Wo Contrast  Result Date: 07/18/2019 CLINICAL DATA:  Back pain. Sepsis. EXAM: MRI LUMBAR SPINE WITHOUT AND WITH CONTRAST TECHNIQUE: Multiplanar and multiecho pulse sequences of the lumbar spine were obtained without and with intravenous contrast. CONTRAST:  19mL GADAVIST GADOBUTROL 1 MMOL/ML IV SOLN COMPARISON:  None. FINDINGS: Segmentation:  Standard. Alignment:  Physiologic. Vertebrae: There is diffuse mottled low signal throughout the bones of the spine and sacrum. The patient is anemic and this may represent reactivated red marrow. Does the patient have any other hematologic disorders? No fractures or bone destruction. There is a small amount of fluid in the L4-5 and L5-S1 disc spaces. No findings suggestive of osteomyelitis. Conus medullaris and cauda equina: Conus extends to the T12-L1 level. Conus and cauda equina appear normal. Paraspinal and other soft  tissues: Negative. Disc levels: L1-2: Normal disc. Slight hypertrophy of the facet joints. L2-3: Tiny central disc bulge with no neural impingement. Slight hypertrophy of the facet joints and ligamentum flavum without neural impingement. L3-4: Tiny central disc bulge with an annular fissure. No neural impingement. Facet joints are normal. L4-5: Chronic degenerative changes of the vertebral endplates. Tiny amount of fluid in the disc space. I doubt that this represents discitis. No significant enhancement after contrast administration. There is a small broad-based disc bulge with accompanying osteophytes extending into the right neural foramen without neural impingement. Slight hypertrophy of the right ligamentum flavum. Facet joints are otherwise normal. L5-S1: Tiny amount of fluid in the left anterolateral aspect of the disc space, likely degenerative in origin. No appreciable enhancement after contrast administration. Tiny broad-based disc bulge without neural impingement. No foraminal stenosis. No facet arthritis. IMPRESSION: 1. No discrete osteomyelitis or discitis in the lumbar spine. Small amounts of fluid in the L4-5 and L5-S1 disc spaces are likely degenerative in origin. 2. Multilevel degenerative disc disease without neural impingement. 3. Diffuse mottled low signal throughout the bones of the spine and sacrum which may represent reactivated red marrow. The patient is anemic. Electronically Signed   By: Lorriane Shire M.D.   On: 07/18/2019 18:34    Cardiac Studies    2D echo 06/2019 IMPRESSIONS   1. Left ventricular ejection fraction, by visual estimation, is 55 to 60%. The left ventricle has normal function. There is mildly increased left ventricular hypertrophy. 2. Left ventricular diastolic function could not be evaluated. 3. Right ventricular volume/pressure overload. 4. The left ventricle demonstrates regional wall motion abnormalities. 5. Global right ventricle has severely  reduced systolic function.The right ventricular size is severely enlarged. No increase in right ventricular wall thickness. 6. Left atrial size was normal. 7. Right atrial size was mild-moderately dilated. 8. Presence of pericardial fat pad. 9. Trivial pericardial effusion is present. 10. Mild mitral annular calcification. 11. The mitral valve is degenerative. Trivial mitral valve regurgitation. 12. The tricuspid valve is grossly normal. Tricuspid valve regurgitation is trivial. 13. The aortic valve is tricuspid. Aortic valve regurgitation is not visualized. No evidence of aortic valve sclerosis or stenosis. 14. The pulmonic valve was grossly normal. Pulmonic valve regurgitation is not visualized. 15. TR signal is inadequate for assessing pulmonary artery systolic pressure. 16. The inferior vena cava is normal in size with greater than 50% respiratory variability, suggesting right atrial pressure of 3 mmHg. 17. No prior Echocardiogram.  Patient Profile     58 y.o. male with recent septic knee (prosthetic joint infection), MRSA bacteremia who is being followed for post-op afib ewith RVR  Assessment &  Plan    Atrial fibrillation with RVR:  -likely exacerbated by sepsis and immediate postoperative period -was on heparin for anticoagulation. Notes suggest previously nonadherent to apixaban. Now on therapeutic Lovenox -received IV amiodarone post op then stopped -went back in to afib with RVR  and placed on Cardizem gtt>>transitioned to PO -team discussed TEE/DCCV over the weekend since he needs TEE anyway to eval for endocarditis and patient who refused  -he would benefit from TEE/DCCV since he already needs TEE to evaluate for endocarditis but I do not think that TEE/DCCV is urgent and would rather wait until his mental confusion clears more -We discussed TEE/DCCV again today and he is willing to proceed with the procedure. Will try to get him on for today.  -continue Toprol XL 200mg   daily  -Continue Cardizem  Sepsis, MRSA bacteremia:  -s/p irrigation and debridement, antibiotic spacer placement -ID following, on daptomycin -TTE as above.  -TEE when mental status is clear/prior to discharge  HTN -BP poorly controlled -Continue Toprol XL 200mg  daily -continue cardizem   For questions or updates, please contact [place] Please consult [email or web address] for contact info under        Signed, Kele Withem Ninfa Meeker, PA-C  07/20/2019, 7:22 AM

## 2019-07-20 NOTE — Progress Notes (Signed)
Subjective:  No new complaints  Antibiotics:  Anti-infectives (From admission, onward)   Start     Dose/Rate Route Frequency Ordered Stop   07/19/19 1300  vancomycin (VANCOCIN) IVPB 1000 mg/200 mL premix     1,000 mg 66.7 mL/hr over 180 Minutes Intravenous Every 12 hours 07/19/19 1145     07/14/19 2000  DAPTOmycin (CUBICIN) 1,000 mg in sodium chloride 0.9 % IVPB  Status:  Discontinued     1,000 mg 240 mL/hr over 30 Minutes Intravenous Daily 07/14/19 0807 07/19/19 1145   07/13/19 2119  vancomycin (VANCOCIN) powder  Status:  Discontinued       As needed 07/13/19 2119 07/13/19 2154   07/13/19 1400  DAPTOmycin (CUBICIN) 944 mg in sodium chloride 0.9 % IVPB  Status:  Discontinued     8 mg/kg  118 kg 237.8 mL/hr over 30 Minutes Intravenous Daily 07/13/19 1320 07/14/19 0807   07/13/19 1015  piperacillin-tazobactam (ZOSYN) IVPB 3.375 g  Status:  Discontinued     3.375 g 12.5 mL/hr over 240 Minutes Intravenous Every 8 hours 07/13/19 1002 07/13/19 1535   07/13/19 0400  cefTRIAXone (ROCEPHIN) 2 g in sodium chloride 0.9 % 100 mL IVPB  Status:  Discontinued     2 g 200 mL/hr over 30 Minutes Intravenous Every 24 hours 07/13/19 0337 07/13/19 0951   07/13/19 0400  vancomycin (VANCOCIN) 2,000 mg in sodium chloride 0.9 % 500 mL IVPB     2,000 mg 250 mL/hr over 120 Minutes Intravenous  Once 07/13/19 0339 07/13/19 0707   07/13/19 0339  vancomycin variable dose per unstable renal function (pharmacist dosing)  Status:  Discontinued      Does not apply See admin instructions 07/13/19 0340 07/13/19 0911      Medications: Scheduled Meds: . Chlorhexidine Gluconate Cloth  6 each Topical Daily  . diltiazem  60 mg Oral Q6H  . docusate sodium  100 mg Oral BID  . enoxaparin (LOVENOX) injection  120 mg Subcutaneous Q12H  . folic acid  1 mg Oral Daily  . metoprolol succinate  200 mg Oral Daily  . multivitamin with minerals  1 tablet Oral Daily  . QUEtiapine  25 mg Oral BID  . thiamine  100 mg  Oral Daily   Or  . thiamine  100 mg Intravenous Daily   Continuous Infusions: . sodium chloride 50 mL/hr at 07/19/19 1400  . vancomycin 1,000 mg (07/20/19 1300)   PRN Meds:.diphenhydrAMINE, haloperidol lactate, LORazepam, menthol-cetylpyridinium **OR** phenol, metoprolol tartrate, ondansetron **OR** ondansetron (ZOFRAN) IV, oxyCODONE, Resource ThickenUp Clear, sodium chloride flush    Objective: Weight change: 3.5 kg  Intake/Output Summary (Last 24 hours) at 07/20/2019 1438 Last data filed at 07/20/2019 0525 Gross per 24 hour  Intake 110.61 ml  Output 350 ml  Net -239.39 ml   Blood pressure (!) 159/103, pulse (!) 114, temperature 98.3 F (36.8 C), temperature source Oral, resp. rate (!) 23, height 6\' 3"  (1.905 m), weight 116.8 kg, SpO2 97 %. Temp:  [97.6 F (36.4 C)-99 F (37.2 C)] 98.3 F (36.8 C) (12/23 0447) Pulse Rate:  [91-121] 114 (12/23 1300) Resp:  [23-42] 23 (12/23 1300) BP: (143-160)/(93-106) 159/103 (12/23 0557) SpO2:  [97 %-99 %] 97 % (12/23 1300) Weight:  [116.8 kg] 116.8 kg (12/23 0447)  Physical Exam: General: Alert oriented person and place, time HEENT: anicteric sclera, EOMI CVS tachycardic but no murmurs gallops rubs heard  Chest: , Relatively clear Abdomen: soft non-distended,  Extremities: Left knee with  bandage his legs have fairly sick significant 3+ pitting edema skin: no rashes Neuro: nonfocal  CBC:    BMET Recent Labs    07/19/19 0527 07/20/19 0222  NA 142 140  K 3.9 3.9  CL 109 105  CO2 20* 20*  GLUCOSE 156* 169*  BUN 31* 22*  CREATININE 1.09 0.93  CALCIUM 7.7* 7.6*     Liver Panel  No results for input(s): PROT, ALBUMIN, AST, ALT, ALKPHOS, BILITOT, BILIDIR, IBILI in the last 72 hours.     Sedimentation Rate No results for input(s): ESRSEDRATE in the last 72 hours. C-Reactive Protein No results for input(s): CRP in the last 72 hours.  Micro Results: Recent Results (from the past 720 hour(s))  Blood Culture (routine  x 2)     Status: Abnormal   Collection Time: 07/13/19  1:10 AM   Specimen: BLOOD  Result Value Ref Range Status   Specimen Description BLOOD RIGHT ANTECUBITAL  Final   Special Requests   Final    BOTTLES DRAWN AEROBIC AND ANAEROBIC Blood Culture adequate volume   Culture  Setup Time   Final    GRAM POSITIVE COCCI IN CLUSTERS IN BOTH AEROBIC AND ANAEROBIC BOTTLES CRITICAL RESULT CALLED TO, READ BACK BY AND VERIFIED WITH: Salome Holmes PHARMD 1510 07/13/19 A BROWNING    Culture (A)  Final    STAPHYLOCOCCUS AUREUS SUSCEPTIBILITIES PERFORMED ON PREVIOUS CULTURE WITHIN THE LAST 5 DAYS. Performed at [facility], [place] [address], [place], [place] [address]    Report Status 07/15/2019 FINAL  Final  Blood Culture (routine x 2)     Status: Abnormal   Collection Time: 07/13/19  1:14 AM   Specimen: BLOOD  Result Value Ref Range Status   Specimen Description BLOOD LEFT ANTECUBITAL  Final   Special Requests   Final    BOTTLES DRAWN AEROBIC AND ANAEROBIC Blood Culture adequate volume   Culture  Setup Time   Final    GRAM POSITIVE COCCI IN CLUSTERS IN BOTH AEROBIC AND ANAEROBIC BOTTLES CRITICAL RESULT CALLED TO, READ BACK BY AND VERIFIED WITH: Salome Holmes [facility] [id number] 07/13/19 A BROWNING Performed at [facility], [place] [address] [place], [place] [address]    Culture METHICILLIN RESISTANT STAPHYLOCOCCUS AUREUS (A)  Final   Report Status 07/15/2019 FINAL  Final   Organism ID, Bacteria METHICILLIN RESISTANT STAPHYLOCOCCUS AUREUS  Final      Susceptibility   Methicillin resistant staphylococcus aureus - MIC*    CIPROFLOXACIN >=8 RESISTANT Resistant     ERYTHROMYCIN >=8 RESISTANT Resistant     GENTAMICIN <=0.5 SENSITIVE Sensitive     OXACILLIN >=4 RESISTANT Resistant     TETRACYCLINE <=1 SENSITIVE Sensitive     VANCOMYCIN <=0.5 SENSITIVE Sensitive     TRIMETH/SULFA <=10 SENSITIVE Sensitive     CLINDAMYCIN <=0.25 SENSITIVE Sensitive     RIFAMPIN <=0.5 SENSITIVE Sensitive     Inducible  Clindamycin NEGATIVE Sensitive     * METHICILLIN RESISTANT STAPHYLOCOCCUS AUREUS  Blood Culture ID Panel (Reflexed)     Status: Abnormal   Collection Time: 07/13/19  1:14 AM  Result Value Ref Range Status   Enterococcus species NOT DETECTED NOT DETECTED Final   Listeria monocytogenes NOT DETECTED NOT DETECTED Final   Staphylococcus species DETECTED (A) NOT DETECTED Final    Comment: CRITICAL RESULT CALLED TO, READ BACK BY AND VERIFIED WITH: Salome Holmes PHARMD 1510 07/13/19 A BROWNING    Staphylococcus aureus (BCID) DETECTED (A) NOT DETECTED Final    Comment:  Methicillin (oxacillin)-resistant Staphylococcus aureus (MRSA). MRSA is predictably resistant to beta-lactam antibiotics (except ceftaroline). Preferred therapy is vancomycin unless clinically contraindicated. Patient requires contact precautions if  hospitalized. CRITICAL RESULT CALLED TO, READ BACK BY AND VERIFIED WITH: Salome Holmes PHARMD [id number] 07/13/19 A BROWNING    Methicillin resistance DETECTED (A) NOT DETECTED Final    Comment: CRITICAL RESULT CALLED TO, READ BACK BY AND VERIFIED WITH: Salome Holmes PHARMD 1510 07/13/19 A BROWNING    Streptococcus species NOT DETECTED NOT DETECTED Final   Streptococcus agalactiae NOT DETECTED NOT DETECTED Final   Streptococcus pneumoniae NOT DETECTED NOT DETECTED Final   Streptococcus pyogenes NOT DETECTED NOT DETECTED Final   Acinetobacter baumannii NOT DETECTED NOT DETECTED Final   Enterobacteriaceae species NOT DETECTED NOT DETECTED Final   Enterobacter cloacae complex NOT DETECTED NOT DETECTED Final   Escherichia coli NOT DETECTED NOT DETECTED Final   Klebsiella oxytoca NOT DETECTED NOT DETECTED Final   Klebsiella pneumoniae NOT DETECTED NOT DETECTED Final   Proteus species NOT DETECTED NOT DETECTED Final   Serratia marcescens NOT DETECTED NOT DETECTED Final   Haemophilus influenzae NOT DETECTED NOT DETECTED Final   Neisseria meningitidis NOT DETECTED NOT DETECTED Final   Pseudomonas  aeruginosa NOT DETECTED NOT DETECTED Final   Candida albicans NOT DETECTED NOT DETECTED Final   Candida glabrata NOT DETECTED NOT DETECTED Final   Candida krusei NOT DETECTED NOT DETECTED Final   Candida parapsilosis NOT DETECTED NOT DETECTED Final   Candida tropicalis NOT DETECTED NOT DETECTED Final    Comment: Performed at [facility], [place]. [address] [place], [place] [address]  SARS CORONAVIRUS 2 (TAT 6-24 HRS) Nasopharyngeal Nasopharyngeal Swab     Status: None   Collection Time: 07/13/19  2:57 AM   Specimen: Nasopharyngeal Swab  Result Value Ref Range Status   SARS Coronavirus 2 NEGATIVE NEGATIVE Final    Comment: (NOTE) SARS-CoV-2 target nucleic acids are NOT DETECTED. The SARS-CoV-2 RNA is generally detectable in upper and lower respiratory specimens during the acute phase of infection. Negative results do not preclude SARS-CoV-2 infection, do not rule out co-infections with other pathogens, and should not be used as the sole basis for treatment or other patient management decisions. Negative results must be combined with clinical observations, patient history, and epidemiological information. The expected result is Negative. Fact Sheet for Patients: SugarRoll.be Fact Sheet for Healthcare Providers: https://www.woods-mathews.com/ This test is not yet approved or cleared by the [place] FDA and  has been authorized for detection and/or diagnosis of SARS-CoV-2 by FDA under an Emergency Use Authorization (EUA). This EUA will remain  in effect (meaning this test can be used) for the duration of the COVID-19 declaration under Section 56 4(b)(1) of the Act, 21 U.S.C. section 360bbb-3(b)(1), unless the authorization is terminated or revoked sooner. Performed at [facility], [place] [address] [place], [place] [address]   Respiratory Panel by RT PCR (Flu A&B, Covid) - Nasopharyngeal Swab     Status: None   Collection Time:  07/13/19  6:56 AM   Specimen: Nasopharyngeal Swab  Result Value Ref Range Status   SARS Coronavirus 2 by RT PCR NEGATIVE NEGATIVE Final    Comment: (NOTE) SARS-CoV-2 target nucleic acids are NOT DETECTED. The SARS-CoV-2 RNA is generally detectable in upper respiratoy specimens during the acute phase of infection. The lowest concentration of SARS-CoV-2 viral copies this assay can detect is 131 copies/mL. A negative result does not preclude SARS-Cov-2 infection and should not be used as the  sole basis for treatment or other patient management decisions. A negative result may occur with  improper specimen collection/handling, submission of specimen other than nasopharyngeal swab, presence of viral mutation(s) within the areas targeted by this assay, and inadequate number of viral copies (<131 copies/mL). A negative result must be combined with clinical observations, patient history, and epidemiological information. The expected result is Negative. Fact Sheet for Patients:  PinkCheek.be Fact Sheet for Healthcare Providers:  GravelBags.it This test is not yet ap proved or cleared by the [place] FDA and  has been authorized for detection and/or diagnosis of SARS-CoV-2 by FDA under an Emergency Use Authorization (EUA). This EUA will remain  in effect (meaning this test can be used) for the duration of the COVID-19 declaration under Section 564(b)(1) of the Act, 21 U.S.C. section 360bbb-3(b)(1), unless the authorization is terminated or revoked sooner.    Influenza A by PCR NEGATIVE NEGATIVE Final   Influenza B by PCR NEGATIVE NEGATIVE Final    Comment: (NOTE) The Xpert Xpress SARS-CoV-2/FLU/RSV assay is intended as an aid in  the diagnosis of influenza from Nasopharyngeal swab specimens and  should not be used as a sole basis for treatment. Nasal washings and  aspirates are unacceptable for Xpert Xpress SARS-CoV-2/FLU/RSV    testing. Fact Sheet for Patients: PinkCheek.be Fact Sheet for Healthcare Providers: GravelBags.it This test is not yet approved or cleared by the [place] FDA and  has been authorized for detection and/or diagnosis of SARS-CoV-2 by  FDA under an Emergency Use Authorization (EUA). This EUA will remain  in effect (meaning this test can be used) for the duration of the  Covid-19 declaration under Section 564(b)(1) of the Act, 21  U.S.C. section 360bbb-3(b)(1), unless the authorization is  terminated or revoked. Performed at [facility], [place] [address] [place], [place] [address]   Urine culture     Status: None   Collection Time: 07/13/19  8:18 AM   Specimen: In/Out Cath Urine  Result Value Ref Range Status   Specimen Description IN/OUT CATH URINE  Final   Special Requests NONE  Final   Culture   Final    NO GROWTH Performed at [facility], [place] [address], [place], [place] [address]    Report Status 07/14/2019 FINAL  Final  MRSA PCR Screening     Status: Abnormal   Collection Time: 07/13/19 10:59 AM   Specimen: Nasal Mucosa; Nasopharyngeal  Result Value Ref Range Status   MRSA by PCR POSITIVE (A) NEGATIVE Final    Comment:        The GeneXpert MRSA Assay (FDA approved for NASAL specimens only), is one component of a comprehensive MRSA colonization surveillance program. It is not intended to diagnose MRSA infection nor to guide or monitor treatment for MRSA infections. RESULT CALLED TO, READ BACK BY AND VERIFIED WITH: B. Adonis Brook RN 13:15 07/13/19 ([email or web address]) Performed at [facility], [place] [address], [place], [place] [address]   Body fluid culture     Status: None   Collection Time: 07/13/19  1:29 PM   Specimen: Synovium; Body Fluid  Result Value Ref Range Status   Specimen Description SYNOVIAL KNEE LEFT  Final   Special Requests NONE  Final   Gram Stain   Final    ABUNDANT WBC PRESENT,  PREDOMINANTLY MONONUCLEAR ABUNDANT GRAM POSITIVE COCCI Performed at [facility], [place] [address], [place], [place] [address]    Culture   Final    ABUNDANT  METHICILLIN RESISTANT STAPHYLOCOCCUS AUREUS   Report Status 07/15/2019 FINAL  Final   Organism ID, Bacteria METHICILLIN RESISTANT STAPHYLOCOCCUS AUREUS  Final      Susceptibility   Methicillin resistant staphylococcus aureus - MIC*    CIPROFLOXACIN >=8 RESISTANT Resistant     ERYTHROMYCIN >=8 RESISTANT Resistant     GENTAMICIN <=0.5 SENSITIVE Sensitive     OXACILLIN >=4 RESISTANT Resistant     TETRACYCLINE <=1 SENSITIVE Sensitive     VANCOMYCIN <=0.5 SENSITIVE Sensitive     TRIMETH/SULFA <=10 SENSITIVE Sensitive     CLINDAMYCIN <=0.25 SENSITIVE Sensitive     RIFAMPIN <=0.5 SENSITIVE Sensitive     Inducible Clindamycin NEGATIVE Sensitive     * ABUNDANT METHICILLIN RESISTANT STAPHYLOCOCCUS AUREUS  Group A Strep by PCR     Status: None   Collection Time: 07/13/19  4:08 PM   Specimen: Throat; Sterile Swab  Result Value Ref Range Status   Group A Strep by PCR NOT DETECTED NOT DETECTED Final    Comment: Performed at [facility], [address] [place], [place] [address]  Culture, blood (routine x 2)     Status: Abnormal   Collection Time: 07/13/19  5:14 PM   Specimen: BLOOD RIGHT HAND  Result Value Ref Range Status   Specimen Description BLOOD RIGHT HAND  Final   Special Requests   Final    BOTTLES DRAWN AEROBIC ONLY Blood Culture adequate volume   Culture  Setup Time   Final    GRAM POSITIVE COCCI AEROBIC BOTTLE ONLY CRITICAL VALUE NOTED.  VALUE IS CONSISTENT WITH PREVIOUSLY REPORTED AND CALLED VALUE.    Culture (A)  Final    STAPHYLOCOCCUS AUREUS SUSCEPTIBILITIES PERFORMED ON PREVIOUS CULTURE WITHIN THE LAST 5 DAYS. Performed at [facility], [place] [address], [place], [place] [address]    Report Status 07/17/2019 FINAL  Final  Culture, blood (routine x 2)     Status: Abnormal   Collection Time: 07/13/19   5:14 PM   Specimen: BLOOD RIGHT HAND  Result Value Ref Range Status   Specimen Description BLOOD RIGHT HAND  Final   Special Requests   Final    BOTTLES DRAWN AEROBIC ONLY Blood Culture adequate volume   Culture  Setup Time   Final    GRAM POSITIVE COCCI AEROBIC BOTTLE ONLY CRITICAL VALUE NOTED.  VALUE IS CONSISTENT WITH PREVIOUSLY REPORTED AND CALLED VALUE.    Culture (A)  Final    STAPHYLOCOCCUS AUREUS SUSCEPTIBILITIES PERFORMED ON PREVIOUS CULTURE WITHIN THE LAST 5 DAYS. Performed at [facility], [place] [address] [place], [place] [address]    Report Status 07/18/2019 FINAL  Final  Culture, blood (routine x 2)     Status: Abnormal   Collection Time: 07/14/19  9:50 AM   Specimen: BLOOD  Result Value Ref Range Status   Specimen Description BLOOD RIGHT ANTECUBITAL  Final   Special Requests   Final    BOTTLES DRAWN AEROBIC ONLY Blood Culture adequate volume   Culture  Setup Time   Final    GRAM POSITIVE COCCI AEROBIC BOTTLE ONLY CRITICAL VALUE NOTED.  VALUE IS CONSISTENT WITH PREVIOUSLY REPORTED AND CALLED VALUE.    Culture (A)  Final    STAPHYLOCOCCUS AUREUS SUSCEPTIBILITIES PERFORMED ON PREVIOUS CULTURE WITHIN THE LAST 5 DAYS. Performed at [facility], [place] [address], [place], [place] [address]    Report Status 07/17/2019 FINAL  Final  Culture, blood (routine x 2)     Status: None  Collection Time: 07/14/19  9:56 AM   Specimen: BLOOD RIGHT HAND  Result Value Ref Range Status   Specimen Description BLOOD RIGHT HAND  Final   Special Requests   Final    BOTTLES DRAWN AEROBIC ONLY Blood Culture results may not be optimal due to an inadequate volume of blood received in culture bottles   Culture   Final    NO GROWTH 5 DAYS Performed at [facility], [place] [address] [place], [place] [address]    Report Status 07/19/2019 FINAL  Final  Culture, blood (routine x 2)     Status: None   Collection Time: 07/15/19 12:21 PM   Specimen: BLOOD RIGHT HAND  Result  Value Ref Range Status   Specimen Description BLOOD RIGHT HAND  Final   Special Requests   Final    BOTTLES DRAWN AEROBIC ONLY Blood Culture results may not be optimal due to an inadequate volume of blood received in culture bottles   Culture   Final    NO GROWTH 5 DAYS Performed at [facility], [place] [address] [place], [place] [address]    Report Status 07/20/2019 FINAL  Final  Culture, blood (routine x 2)     Status: None   Collection Time: 07/15/19  1:20 PM   Specimen: BLOOD RIGHT ARM  Result Value Ref Range Status   Specimen Description BLOOD RIGHT ARM  Final   Special Requests   Final    BOTTLES DRAWN AEROBIC ONLY Blood Culture adequate volume   Culture   Final    NO GROWTH 5 DAYS Performed at [facility], [place] [address] [place], [place] [address]    Report Status 07/20/2019 FINAL  Final    Studies/Results: MR Lumbar Spine W Wo Contrast  Result Date: 07/18/2019 CLINICAL DATA:  Back pain. Sepsis. EXAM: MRI LUMBAR SPINE WITHOUT AND WITH CONTRAST TECHNIQUE: Multiplanar and multiecho pulse sequences of the lumbar spine were obtained without and with intravenous contrast. CONTRAST:  75mL GADAVIST GADOBUTROL 1 MMOL/ML IV SOLN COMPARISON:  None. FINDINGS: Segmentation:  Standard. Alignment:  Physiologic. Vertebrae: There is diffuse mottled low signal throughout the bones of the spine and sacrum. The patient is anemic and this may represent reactivated red marrow. Does the patient have any other hematologic disorders? No fractures or bone destruction. There is a small amount of fluid in the L4-5 and L5-S1 disc spaces. No findings suggestive of osteomyelitis. Conus medullaris and cauda equina: Conus extends to the T12-L1 level. Conus and cauda equina appear normal. Paraspinal and other soft tissues: Negative. Disc levels: L1-2: Normal disc. Slight hypertrophy of the facet joints. L2-3: Tiny central disc bulge with no neural impingement. Slight hypertrophy of the facet joints and  ligamentum flavum without neural impingement. L3-4: Tiny central disc bulge with an annular fissure. No neural impingement. Facet joints are normal. L4-5: Chronic degenerative changes of the vertebral endplates. Tiny amount of fluid in the disc space. I doubt that this represents discitis. No significant enhancement after contrast administration. There is a small broad-based disc bulge with accompanying osteophytes extending into the right neural foramen without neural impingement. Slight hypertrophy of the right ligamentum flavum. Facet joints are otherwise normal. L5-S1: Tiny amount of fluid in the left anterolateral aspect of the disc space, likely degenerative in origin. No appreciable enhancement after contrast administration. Tiny broad-based disc bulge without neural impingement. No foraminal stenosis. No facet arthritis. IMPRESSION: 1. No discrete osteomyelitis or discitis in the lumbar spine. Small amounts of  fluid in the L4-5 and L5-S1 disc spaces are likely degenerative in origin. 2. Multilevel degenerative disc disease without neural impingement. 3. Diffuse mottled low signal throughout the bones of the spine and sacrum which may represent reactivated red marrow. The patient is anemic. Electronically Signed   By: Lorriane Shire M.D.   On: 07/18/2019 18:34      Assessment/Plan:  INTERVAL HISTORY: He is going for transesophageal echocardiogram and DC cardioversion today   Principal Problem:   MRSA bacteremia Active Problems:   Septic joint ([place])   Atrial fibrillation with rapid ventricular response (HCC)   AKI (acute kidney injury) ([place])   Hyponatremia   Severe sepsis (HCC)   Severe sepsis with acute organ dysfunction (HCC)   Respiratory insufficiency   Acute pain of left knee   Prosthetic joint infection (HCC)   Tachypnea   Acute bilateral low back pain without sciatica    Vernon Frye is a 58 y.o. 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  #1        [facility] Staphylococcus aureus bacteremia   Staphylococcus aureus bacteremia (SAB) is associated with a high rate of complications and mortality.  Specific aspects of clinical management are critical to optimizing the outcome of patients with SAB.  Therefore, the [facility] Antimicrobial Management Team [facility]) has initiated an intervention aimed at improving the management of SAB at [facility].  To do so, Infectious Diseases physicians are providing an evidence-based consult for the management of all patients with SAB.     Yes No Comments  Perform follow-up blood cultures (even if the patient is afebrile) to ensure clearance of bacteremia [x]  []   I repeated blood cultures after his midline was removed and no growth at 5 days  Remove vascular catheter and obtain follow-up blood cultures after the removal of the catheter []  []   he has a new central line that was placed on Saturday hopefully after bacteremia was cleared  Perform echocardiography to evaluate for endocarditis (transthoracic ECHO is 40-50% sensitive, TEE is > 90% sensitive) []  []   he is going to have a TEE today  Consult electrophysiologist to evaluate implanted cardiac device (pacemaker, ICD) []  []    Ensure source control []  []  Have all abscesses been drained effectively? Have deep seeded infections (septic joints or osteomyelitis) had appropriate surgical debridement?  Knee seems to have been the source  Investigate for "metastatic" sites of infection []  []  Does the patient have ANY symptom or physical exam finding that would suggest a deeper infection (back or neck pain that may be suggestive of vertebral osteomyelitis or epidural abscess, muscle pain that could be a symptom of pyomyositis)?  Keep in mind that for deep seeded infections MRI imaging with contrast is preferred rather  than other often insensitive tests such as plain x-rays, especially early in a patient's presentation.  I am ordering MRI of the lumbar spine  Change antibiotic therapy to vancomcyin  (challenge) []  []  Beta-lactam antibiotics are preferred for MSSA due to higher cure rates.   If on Vancomycin, goal trough should be 15 - 20 mcg/mL  Estimated duration of IV antibiotic therapy: 6 weeks followed by 5 to 6 months of oral antibiotics given prosthetic joint infection []  []  Consult case management for probably prolonged outpatient IV antibiotic therapy   #2 prosthetic joint infection: We will give him  at least 6 months of therapy normally would add rifampin as an adjunct of drug but with his alcoholism never going to do that.  3.  Low back pain  MRI lumbar spine negative  4  Atrial fibrillation cardiology was considering DC cardioversion but now with rate control.  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   LOS: 7 days   Alcide Evener 07/20/2019, 2:38 PM

## 2019-07-20 NOTE — Progress Notes (Signed)
[facility]  PROGRESS NOTE  Vernon Frye [id number] DOB: 1961-02-11 DOA: 07/12/2019 PCP: Nicholes Rough, PA-C Admit date - 07/12/2019   Admitting Physician Rush Farmer, MD  Outpatient Primary MD for the patient is Nicholes Rough, PA-C  LOS - 7 Brief Narrative   Vernon Frye is a 58 y.o. year old male with medical history significant for Afib not taking prescribed Eliquis is unable to afford and only on aspirin, anxiety, arthritis, hypertension, left total knee arthroplasty in 2016 who presented on 07/12/2019 with left knee pain and swelling x1 day with inability to bear weight or bend knee and was found to have A. fib with RVR with initial lactic acid of 4.2, and WBC of 16 consistent with sepsis presume related to right knee and required Cardizem infusion for rate control.  Underwent left knee aspiration on 12/16 with 120 cc of purulent synovial fluid, was taken to the OR on 12/16 for septic left total knee replacement by [name].  Monitored in ICU after operation, status post extubation, course complicated by toxic metabolic encephalopathy requiring Precedex infusion in the setting of MRSA bacteremia secondary to septic right knee joint  Hospital course complicated by concern for vancomycin tolerance.  Vanco started in the ED during volume resuscitation patient became erythematous with blanching facial rash extending from neck to clavicle prompting discontinuation of bank and supportive treatment with Benadryl, Pepcid.  Patient became tachypneic and desaturated to 80% start on 3 L nasal cannula for which PCCM was consulted on admission  Subjective  Reports of increased bloody drainage from left knee overnight. Patient states pain is well controlled on left knee Later in the evening/late afternoon somehow patient slid out of bed onto her knees without any visible injury or worsening pain or discomfort  A & P   MRSA bacteremia secondary to septic right knee joint, status post  washout and antibiotic spacer placement Initially on daptomycin due to concern for allergic drug reaction after vancomycin initiation, currently tolerating vancomycin TTE negative for vegetations.  Underwent emergent source control with operative debridement on 12/16 by orthopedics.  Sepsis physiology resolved.  Repeat blood cultures (12/18) unremarkable MRI lumbar spine negative -Plan for TEE by cardiology on 12/24 -Tolerating IV vancomycin, per ID  Atrial fibrillation with RVR Difficulty in rate control despite oral diltiazem and metoprolol Nonadherent with Eliquis as outpatient -Cardiology plans for TEE/cardioversion -Continue Lovenox  Acute toxic metabolic encephalopathy, secondary to agitated delirium, improving.   Presume in the setting of sepsis as well as high concern for polysubstance abuse/alcohol abuse/withdrawal Previous required Precedex infusion in ICU, alert to self only currently, mental status much more stable no active agitation.  Still seems to have some bouts of delirium but no aggression/agitation -continue supplementation with thiamine, folate, multivitamin -Continue scheduled Seroquel -As needed Haldol, Ativan for agitation  Left upper extremity swelling Suspect most like related to IV line site -Venous duplex to ensure no DVT  Acute respiratory insufficiency with hypoxia, resolved Intubated during operative debridement of knee next abated without difficulty. 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  Now back to baseline 0.93 -Avoid nephrotoxins, monitor BMP, monitor output   Family Communication  : None at bedside  Code Status : Full  Disposition Plan  : TEE, continue IV vancomycin, close monitoring with status  Consults  : PCCM, cardiology, ID, orthopedics  Procedures  :  12/16 septic left total knee replacement TTE, 12/19 55-60% Venous DVT, 12/16  Bilateral lower extremities, negative for DVT  DVT Prophylaxis  :  Lovenox  Lab Results  Component Value Date   PLT 311 07/20/2019    Diet :  Diet Order            DIET DYS 3 Room service appropriate? Yes with Assist; Fluid consistency: Thin  Diet effective now               Inpatient Medications Scheduled Meds: . Chlorhexidine Gluconate Cloth  6 each Topical Daily  . diltiazem  60 mg Oral Q6H  . docusate sodium  100 mg Oral BID  . enoxaparin (LOVENOX) injection  120 mg Subcutaneous Q12H  . folic acid  1 mg Oral Daily  . metoprolol succinate  200 mg Oral Daily  . multivitamin with minerals  1 tablet Oral Daily  . QUEtiapine  25 mg Oral BID  . thiamine  100 mg Oral Daily   Or  . thiamine  100 mg Intravenous Daily   Continuous Infusions: . sodium chloride 50 mL/hr at 07/19/19 1400  . vancomycin 1,000 mg (07/20/19 1300)   PRN Meds:.diphenhydrAMINE, haloperidol lactate, LORazepam, menthol-cetylpyridinium **OR** phenol, metoprolol tartrate, ondansetron **OR** ondansetron (ZOFRAN) IV, oxyCODONE, Resource ThickenUp Clear, sodium chloride flush  Antibiotics  :   Anti-infectives (From admission, onward)   Start     Dose/Rate Route Frequency Ordered Stop   07/19/19 1300  vancomycin (VANCOCIN) IVPB 1000 mg/200 mL premix     1,000 mg 66.7 mL/hr over 180 Minutes Intravenous Every 12 hours 07/19/19 1145     07/14/19 2000  DAPTOmycin (CUBICIN) 1,000 mg in sodium chloride 0.9 % IVPB  Status:   Discontinued     1,000 mg 240 mL/hr over 30 Minutes Intravenous Daily 07/14/19 0807 07/19/19 1145   07/13/19 2119  vancomycin (VANCOCIN) powder  Status:  Discontinued       As needed 07/13/19 2119 07/13/19 2154   07/13/19 1400  DAPTOmycin (CUBICIN) 944 mg in sodium chloride 0.9 % IVPB  Status:  Discontinued     8 mg/kg  118 kg 237.8 mL/hr over 30 Minutes Intravenous Daily 07/13/19 1320 07/14/19 0807   07/13/19 1015  piperacillin-tazobactam (ZOSYN) IVPB 3.375 g  Status:  Discontinued     3.375 g 12.5 mL/hr over 240 Minutes Intravenous Every 8 hours 07/13/19 1002 07/13/19 1535   07/13/19 0400  cefTRIAXone (ROCEPHIN) 2 g in sodium chloride 0.9 % 100 mL IVPB  Status:  Discontinued     2 g 200 mL/hr over 30 Minutes Intravenous Every 24 hours 07/13/19 0337 07/13/19 0951   07/13/19 0400  vancomycin (VANCOCIN) 2,000 mg in sodium chloride 0.9 % 500 mL IVPB     2,000 mg 250 mL/hr over 120 Minutes Intravenous  Once 07/13/19 0339 07/13/19 0707   07/13/19 0339  vancomycin variable dose per unstable renal function (pharmacist dosing)  Status:  Discontinued      Does not apply See admin instructions 07/13/19 0340 07/13/19 0911       Objective   Vitals:   07/20/19 0557 07/20/19 0856 07/20/19 1300 07/20/19 1650  BP: [name] Kitchen)  159/103   (!) 144/99  Pulse:  (!) 118 (!) 114 98  Resp:  (!) 32 (!) 23 (!) 38  Temp:    97.8 F (36.6 C)  TempSrc:    Oral  SpO2:  99% 97% 100%  Weight:      Height:        SpO2: 100 % O2 Flow Rate (L/min): 0.5 L/min FiO2 (%): 30 %  Wt Readings from Last 3 Encounters:  07/20/19 116.8 kg  09/16/18 117.9 kg  06/19/15 114 kg     Intake/Output Summary (Last 24 hours) at 07/20/2019 1917 Last data filed at 07/20/2019 0525 Gross per 24 hour  Intake --  Output 350 ml  Net -350 ml    Physical Exam:  Awake Alert, oriented to self only, mentions seeing people in the room No new F.N deficits,  Ruth.AT, Flushed face Symmetrical Chest wall movement, Good air movement  bilaterally on room air, CTAB Irregularly irregular rhythm, tachycardic +ve B.Sounds, Abd Soft, No tenderness, No organomegaly appreciated, No rebound, guarding or rigidity. Left knee wrapped in bandage that is clean dry and intact Significant left-sided swelling of arm from hand to antecubital fossa   I have personally reviewed the following:   Data Reviewed:  CBC Recent Labs  Lab 07/14/19 2357 07/16/19 0344 07/17/19 0503 07/18/19 0233 07/20/19 0222  WBC 19.9* 19.0* 21.2* 19.1* 21.1*  HGB 10.8* 11.0* 12.1* 11.1* 11.1*  HCT 32.2* 32.8* 37.0* 32.6* 33.4*  PLT 197 127* 192 176 311  MCV 87.5 88.6 88.9 85.8 88.1  MCH 29.3 29.7 29.1 29.2 29.3  MCHC 33.5 33.5 32.7 34.0 33.2  RDW 14.7 15.3 14.9 14.6 15.1  LYMPHSABS  --   --   --   --  2.0  MONOABS  --   --   --   --  0.9  EOSABS  --   --   --   --  0.0  BASOSABS  --   --   --   --  0.1    Chemistries  Recent Labs  Lab 07/14/19 0600 07/17/19 1539 07/18/19 0233 07/19/19 0527 07/20/19 0222  NA 132* 142 143 142 140  K 4.4 3.5 3.9 3.9 3.9  CL 98 110 109 109 105  CO2 19* 21* 22 20* 20*  GLUCOSE 174* 186* 167* 156* 169*  BUN 55* 46* 38* 31* 22*  CREATININE 2.42* 1.25* 1.00 1.09 0.93  CALCIUM 7.6* 7.8* 7.9* 7.7* 7.6*  MG 2.1  --   --   --   --    ------------------------------------------------------------------------------------------------------------------ No results for input(s): CHOL, HDL, LDLCALC, TRIG, CHOLHDL, LDLDIRECT in the last 72 hours.  Lab Results  Component Value Date   HGBA1C 6.3 (H) 07/13/2019   ------------------------------------------------------------------------------------------------------------------ No results for input(s): TSH, T4TOTAL, T3FREE, THYROIDAB in the last 72 hours.  Invalid input(s): FREET3 ------------------------------------------------------------------------------------------------------------------ No results for input(s): VITAMINB12, FOLATE, FERRITIN, TIBC, IRON, RETICCTPCT  in the last 72 hours.  Coagulation profile No results for input(s): INR, PROTIME in the last 168 hours.  No results for input(s): DDIMER in the last 72 hours.  Cardiac Enzymes No results for input(s): CKMB, TROPONINI, MYOGLOBIN in the last 168 hours.  Invalid input(s): CK ------------------------------------------------------------------------------------------------------------------    Component Value Date/Time   BNP 299.7 (H) 07/13/2019 0431    Micro Results Recent Results (from the past 240 hour(s))  Blood Culture (routine x 2)     Status: Abnormal   Collection Time: 07/13/19  1:10 AM   Specimen: BLOOD  Result Value  Ref Range Status   Specimen Description BLOOD RIGHT ANTECUBITAL  Final   Special Requests   Final    BOTTLES DRAWN AEROBIC AND ANAEROBIC Blood Culture adequate volume   Culture  Setup Time   Final    GRAM POSITIVE COCCI IN CLUSTERS IN BOTH AEROBIC AND ANAEROBIC BOTTLES CRITICAL RESULT CALLED TO, READ BACK BY AND VERIFIED WITH: Salome Holmes PHARMD 1510 07/13/19 A BROWNING    Culture (A)  Final    STAPHYLOCOCCUS AUREUS SUSCEPTIBILITIES PERFORMED ON PREVIOUS CULTURE WITHIN THE LAST 5 DAYS. Performed at [facility], [place] [address], [place], [place] [address]    Report Status 07/15/2019 FINAL  Final  Blood Culture (routine x 2)     Status: Abnormal   Collection Time: 07/13/19  1:14 AM   Specimen: BLOOD  Result Value Ref Range Status   Specimen Description BLOOD LEFT ANTECUBITAL  Final   Special Requests   Final    BOTTLES DRAWN AEROBIC AND ANAEROBIC Blood Culture adequate volume   Culture  Setup Time   Final    GRAM POSITIVE COCCI IN CLUSTERS IN BOTH AEROBIC AND ANAEROBIC BOTTLES CRITICAL RESULT CALLED TO, READ BACK BY AND VERIFIED WITH: Salome [facility] [id number] 07/13/19 A BROWNING Performed at [facility], [place] [address] [place],  [address]    Culture METHICILLIN RESISTANT STAPHYLOCOCCUS AUREUS (A)  Final   Report Status 07/15/2019 FINAL   Final   Organism ID, Bacteria METHICILLIN RESISTANT STAPHYLOCOCCUS AUREUS  Final      Susceptibility   Methicillin resistant staphylococcus aureus - MIC*    CIPROFLOXACIN >=8 RESISTANT Resistant     ERYTHROMYCIN >=8 RESISTANT Resistant     GENTAMICIN <=0.5 SENSITIVE Sensitive     OXACILLIN >=4 RESISTANT Resistant     TETRACYCLINE <=1 SENSITIVE Sensitive     VANCOMYCIN <=0.5 SENSITIVE Sensitive     TRIMETH/SULFA <=10 SENSITIVE Sensitive     CLINDAMYCIN <=0.25 SENSITIVE Sensitive     RIFAMPIN <=0.5 SENSITIVE Sensitive     Inducible Clindamycin NEGATIVE Sensitive     * METHICILLIN RESISTANT STAPHYLOCOCCUS AUREUS  Blood Culture ID Panel (Reflexed)     Status: Abnormal   Collection Time: 07/13/19  1:14 AM  Result Value Ref Range Status   Enterococcus species NOT DETECTED NOT DETECTED Final   Listeria monocytogenes NOT DETECTED NOT DETECTED Final   Staphylococcus species DETECTED (A) NOT DETECTED Final    Comment: CRITICAL RESULT CALLED TO, READ BACK BY AND VERIFIED WITH: Salome Holmes PHARMD 1510 07/13/19 A BROWNING    Staphylococcus aureus (BCID) DETECTED (A) NOT DETECTED Final    Comment: Methicillin (oxacillin)-resistant Staphylococcus aureus (MRSA). MRSA is predictably resistant to beta-lactam antibiotics (except ceftaroline). Preferred therapy is vancomycin unless clinically contraindicated. Patient requires contact precautions if  hospitalized. CRITICAL RESULT CALLED TO, READ BACK BY AND VERIFIED WITH: Salome Holmes PHARMD [id number] 07/13/19 A BROWNING    Methicillin resistance DETECTED (A) NOT DETECTED Final    Comment: CRITICAL RESULT CALLED TO, READ BACK BY AND VERIFIED WITH: Salome Holmes PHARMD 1510 07/13/19 A BROWNING    Streptococcus species NOT DETECTED NOT DETECTED Final   Streptococcus agalactiae NOT DETECTED NOT DETECTED Final   Streptococcus pneumoniae NOT DETECTED NOT DETECTED Final   Streptococcus pyogenes NOT DETECTED NOT DETECTED Final   Acinetobacter baumannii NOT DETECTED NOT  DETECTED Final   Enterobacteriaceae species NOT DETECTED NOT DETECTED Final   Enterobacter cloacae complex NOT DETECTED NOT DETECTED Final   Escherichia coli NOT DETECTED NOT DETECTED Final  Klebsiella oxytoca NOT DETECTED NOT DETECTED Final   Klebsiella pneumoniae NOT DETECTED NOT DETECTED Final   Proteus species NOT DETECTED NOT DETECTED Final   Serratia marcescens NOT DETECTED NOT DETECTED Final   Haemophilus influenzae NOT DETECTED NOT DETECTED Final   Neisseria meningitidis NOT DETECTED NOT DETECTED Final   Pseudomonas aeruginosa NOT DETECTED NOT DETECTED Final   Candida albicans NOT DETECTED NOT DETECTED Final   Candida glabrata NOT DETECTED NOT DETECTED Final   Candida krusei NOT DETECTED NOT DETECTED Final   Candida parapsilosis NOT DETECTED NOT DETECTED Final   Candida tropicalis NOT DETECTED NOT DETECTED Final    Comment: Performed at [facility], [place] [address] [place], [place] [address]  SARS CORONAVIRUS 2 (TAT 6-24 HRS) Nasopharyngeal Nasopharyngeal Swab     Status: None   Collection Time: 07/13/19  2:57 AM   Specimen: Nasopharyngeal Swab  Result Value Ref Range Status   SARS Coronavirus 2 NEGATIVE NEGATIVE Final    Comment: (NOTE) SARS-CoV-2 target nucleic acids are NOT DETECTED. The SARS-CoV-2 RNA is generally detectable in upper and lower respiratory specimens during the acute phase of infection. Negative results do not preclude SARS-CoV-2 infection, do not rule out co-infections with other pathogens, and should not be used as the sole basis for treatment or other patient management decisions. Negative results must be combined with clinical observations, patient history, and epidemiological information. The expected result is Negative. Fact Sheet for Patients: SugarRoll.be Fact Sheet for Healthcare Providers: https://www.woods-mathews.com/ This test is not yet approved or cleared by the [place] FDA and  has  been authorized for detection and/or diagnosis of SARS-CoV-2 by FDA under an Emergency Use Authorization (EUA). This EUA will remain  in effect (meaning this test can be used) for the duration of the COVID-19 declaration under Section 56 4(b)(1) of the Act, 21 U.S.C. section 360bbb-3(b)(1), unless the authorization is terminated or revoked sooner. Performed at [facility], [place] [address], [place], [place] [address]   Respiratory Panel by RT PCR (Flu A&B, Covid) - Nasopharyngeal Swab     Status: None   Collection Time: 07/13/19  6:56 AM   Specimen: Nasopharyngeal Swab  Result Value Ref Range Status   SARS Coronavirus 2 by RT PCR NEGATIVE NEGATIVE Final    Comment: (NOTE) SARS-CoV-2 target nucleic acids are NOT DETECTED. The SARS-CoV-2 RNA is generally detectable in upper respiratoy specimens during the acute phase of infection. The lowest concentration of SARS-CoV-2 viral copies this assay can detect is 131 copies/mL. A negative result does not preclude SARS-Cov-2 infection and should not be used as the sole basis for treatment or other patient management decisions. A negative result may occur with  improper specimen collection/handling, submission of specimen other than nasopharyngeal swab, presence of viral mutation(s) within the areas targeted by this assay, and inadequate number of viral copies (<131 copies/mL). A negative result must be combined with clinical observations, patient history, and epidemiological information. The expected result is Negative. Fact Sheet for Patients:  PinkCheek.be Fact Sheet for Healthcare Providers:  GravelBags.it This test is not yet ap proved or cleared by the [place] FDA and  has been authorized for detection and/or diagnosis of SARS-CoV-2 by FDA under an Emergency Use Authorization (EUA). This EUA will remain  in effect (meaning this test can be used) for the duration of  the COVID-19 declaration under Section 564(b)(1) of the Act, 21 U.S.C. section 360bbb-3(b)(1), unless the authorization is terminated or revoked sooner.    Influenza  A by PCR NEGATIVE NEGATIVE Final   Influenza B by PCR NEGATIVE NEGATIVE Final    Comment: (NOTE) The Xpert Xpress SARS-CoV-2/FLU/RSV assay is intended as an aid in  the diagnosis of influenza from Nasopharyngeal swab specimens and  should not be used as a sole basis for treatment. Nasal washings and  aspirates are unacceptable for Xpert Xpress SARS-CoV-2/FLU/RSV  testing. Fact Sheet for Patients: PinkCheek.be Fact Sheet for Healthcare Providers: GravelBags.it This test is not yet approved or cleared by the [place] FDA and  has been authorized for detection and/or diagnosis of SARS-CoV-2 by  FDA under an Emergency Use Authorization (EUA). This EUA will remain  in effect (meaning this test can be used) for the duration of the  Covid-19 declaration under Section 564(b)(1) of the Act, 21  U.S.C. section 360bbb-3(b)(1), unless the authorization is  terminated or revoked. Performed at [facility], [place] [address] [place], [place] [address]   Urine culture     Status: None   Collection Time: 07/13/19  8:18 AM   Specimen: In/Out Cath Urine  Result Value Ref Range Status   Specimen Description IN/OUT CATH URINE  Final   Special Requests NONE  Final   Culture   Final    NO GROWTH Performed at [facility], [place] [address], [place], [place] [address]    Report Status 07/14/2019 FINAL  Final  MRSA PCR Screening     Status: Abnormal   Collection Time: 07/13/19 10:59 AM   Specimen: Nasal Mucosa; Nasopharyngeal  Result Value Ref Range Status   MRSA by PCR POSITIVE (A) NEGATIVE Final    Comment:        The GeneXpert MRSA Assay (FDA approved for NASAL specimens only), is one component of a comprehensive MRSA colonization surveillance program. It is  not intended to diagnose MRSA infection nor to guide or monitor treatment for MRSA infections. RESULT CALLED TO, READ BACK BY AND VERIFIED WITH: B. Adonis Brook RN 13:15 07/13/19 ([email or web address]) Performed at [facility], [place] [address] [place], [place] [address]   Body fluid culture     Status: None   Collection Time: 07/13/19  1:29 PM   Specimen: Synovium; Body Fluid  Result Value Ref Range Status   Specimen Description SYNOVIAL KNEE LEFT  Final   Special Requests NONE  Final   Gram Stain   Final    ABUNDANT WBC PRESENT, PREDOMINANTLY MONONUCLEAR ABUNDANT GRAM POSITIVE COCCI Performed at [facility], [place] [address] [place], [place] [address]    Culture   Final    ABUNDANT METHICILLIN RESISTANT STAPHYLOCOCCUS AUREUS   Report Status 07/15/2019 FINAL  Final   Organism ID, Bacteria METHICILLIN RESISTANT STAPHYLOCOCCUS AUREUS  Final      Susceptibility   Methicillin resistant staphylococcus aureus - MIC*    CIPROFLOXACIN >=8 RESISTANT Resistant     ERYTHROMYCIN >=8 RESISTANT Resistant     GENTAMICIN <=0.5 SENSITIVE Sensitive     OXACILLIN >=4 RESISTANT Resistant     TETRACYCLINE <=1 SENSITIVE Sensitive     VANCOMYCIN <=0.5 SENSITIVE Sensitive     TRIMETH/SULFA <=10 SENSITIVE Sensitive     CLINDAMYCIN <=0.25 SENSITIVE Sensitive     RIFAMPIN <=0.5 SENSITIVE Sensitive     Inducible Clindamycin NEGATIVE Sensitive     * ABUNDANT METHICILLIN RESISTANT STAPHYLOCOCCUS AUREUS  Group A Strep by PCR     Status: None   Collection Time: 07/13/19  4:08 PM   Specimen: Throat; Sterile Swab  Result  Value Ref Range Status   Group A Strep by PCR NOT DETECTED NOT DETECTED Final    Comment: Performed at [facility], [place] [address] [place], [place] [address]  Culture, blood (routine x 2)     Status: Abnormal   Collection Time: 07/13/19  5:14 PM   Specimen: BLOOD RIGHT HAND  Result Value Ref Range Status   Specimen Description BLOOD RIGHT HAND  Final   Special Requests   Final     BOTTLES DRAWN AEROBIC ONLY Blood Culture adequate volume   Culture  Setup Time   Final    GRAM POSITIVE COCCI AEROBIC BOTTLE ONLY CRITICAL VALUE NOTED.  VALUE IS CONSISTENT WITH PREVIOUSLY REPORTED AND CALLED VALUE.    Culture (A)  Final    STAPHYLOCOCCUS AUREUS SUSCEPTIBILITIES PERFORMED ON PREVIOUS CULTURE WITHIN THE LAST 5 DAYS. Performed at [facility], [place] [address], [place], [place] [address]    Report Status 07/17/2019 FINAL  Final  Culture, blood (routine x 2)     Status: Abnormal   Collection Time: 07/13/19  5:14 PM   Specimen: BLOOD RIGHT HAND  Result Value Ref Range Status   Specimen Description BLOOD RIGHT HAND  Final   Special Requests   Final    BOTTLES DRAWN AEROBIC ONLY Blood Culture adequate volume   Culture  Setup Time   Final    GRAM POSITIVE COCCI AEROBIC BOTTLE ONLY CRITICAL VALUE NOTED.  VALUE IS CONSISTENT WITH PREVIOUSLY REPORTED AND CALLED VALUE.    Culture (A)  Final    STAPHYLOCOCCUS AUREUS SUSCEPTIBILITIES PERFORMED ON PREVIOUS CULTURE WITHIN THE LAST 5 DAYS. Performed at [facility], [place] [address], [place], [place] [address]    Report Status 07/18/2019 FINAL  Final  Culture, blood (routine x 2)     Status: Abnormal   Collection Time: 07/14/19  9:50 AM   Specimen: BLOOD  Result Value Ref Range Status   Specimen Description BLOOD RIGHT ANTECUBITAL  Final   Special Requests   Final    BOTTLES DRAWN AEROBIC ONLY Blood Culture adequate volume   Culture  Setup Time   Final    GRAM POSITIVE COCCI AEROBIC BOTTLE ONLY CRITICAL VALUE NOTED.  VALUE IS CONSISTENT WITH PREVIOUSLY REPORTED AND CALLED VALUE.    Culture (A)  Final    STAPHYLOCOCCUS AUREUS SUSCEPTIBILITIES PERFORMED ON PREVIOUS CULTURE WITHIN THE LAST 5 DAYS. Performed at [facility], [place] [address], [place], [place] [address]    Report Status 07/17/2019 FINAL  Final  Culture, blood (routine x 2)     Status: None   Collection Time: 07/14/19  9:56 AM   Specimen:  BLOOD RIGHT HAND  Result Value Ref Range Status   Specimen Description BLOOD RIGHT HAND  Final   Special Requests   Final    BOTTLES DRAWN AEROBIC ONLY Blood Culture results may not be optimal due to an inadequate volume of blood received in culture bottles   Culture   Final    NO GROWTH 5 DAYS Performed at [facility], [place] [address] [place],  [address]    Report Status 07/19/2019 FINAL  Final  Culture, blood (routine x 2)     Status: None   Collection Time: 07/15/19 12:21 PM   Specimen: BLOOD RIGHT HAND  Result Value Ref Range Status   Specimen Description BLOOD RIGHT HAND  Final   Special Requests   Final    BOTTLES DRAWN AEROBIC ONLY Blood Culture results  may not be optimal due to an inadequate volume of blood received in culture bottles   Culture   Final    NO GROWTH 5 DAYS Performed at [facility], [place] [address], [place], [place] [address]    Report Status 07/20/2019 FINAL  Final  Culture, blood (routine x 2)     Status: None   Collection Time: 07/15/19  1:20 PM   Specimen: BLOOD RIGHT ARM  Result Value Ref Range Status   Specimen Description BLOOD RIGHT ARM  Final   Special Requests   Final    BOTTLES DRAWN AEROBIC ONLY Blood Culture adequate volume   Culture   Final    NO GROWTH 5 DAYS Performed at [facility], [place] [address], [place],  [address]    Report Status 07/20/2019 FINAL  Final    Radiology Reports MR Lumbar Spine W Wo Contrast  Result Date: 07/18/2019 CLINICAL DATA:  Back pain. Sepsis. EXAM: MRI LUMBAR SPINE WITHOUT AND WITH CONTRAST TECHNIQUE: Multiplanar and multiecho pulse sequences of the lumbar spine were obtained without and with intravenous contrast. CONTRAST:  11mL GADAVIST GADOBUTROL 1 MMOL/ML IV SOLN COMPARISON:  None. FINDINGS: Segmentation:  Standard. Alignment:  Physiologic. Vertebrae: There is diffuse mottled low signal throughout the bones of the spine and sacrum. The patient is anemic and this may represent  reactivated red marrow. Does the patient have any other hematologic disorders? No fractures or bone destruction. There is a small amount of fluid in the L4-5 and L5-S1 disc spaces. No findings suggestive of osteomyelitis. Conus medullaris and cauda equina: Conus extends to the T12-L1 level. Conus and cauda equina appear normal. Paraspinal and other soft tissues: Negative. Disc levels: L1-2: Normal disc. Slight hypertrophy of the facet joints. L2-3: Tiny central disc bulge with no neural impingement. Slight hypertrophy of the facet joints and ligamentum flavum without neural impingement. L3-4: Tiny central disc bulge with an annular fissure. No neural impingement. Facet joints are normal. L4-5: Chronic degenerative changes of the vertebral endplates. Tiny amount of fluid in the disc space. I doubt that this represents discitis. No significant enhancement after contrast administration. There is a small broad-based disc bulge with accompanying osteophytes extending into the right neural foramen without neural impingement. Slight hypertrophy of the right ligamentum flavum. Facet joints are otherwise normal. L5-S1: Tiny amount of fluid in the left anterolateral aspect of the disc space, likely degenerative in origin. No appreciable enhancement after contrast administration. Tiny broad-based disc bulge without neural impingement. No foraminal stenosis. No facet arthritis. IMPRESSION: 1. No discrete osteomyelitis or discitis in the lumbar spine. Small amounts of fluid in the L4-5 and L5-S1 disc spaces are likely degenerative in origin. 2. Multilevel degenerative disc disease without neural impingement. 3. Diffuse mottled low signal throughout the bones of the spine and sacrum which may represent reactivated red marrow. The patient is anemic. Electronically Signed   By: Lorriane Shire M.D.   On: 07/18/2019 18:34   US Renal  Result Date: 07/13/2019 CLINICAL DATA:  Acute renal injury EXAM: RENAL / URINARY TRACT  ULTRASOUND COMPLETE COMPARISON:  None. FINDINGS: Right Kidney: Renal measurements: 14.7 x 6.1 x 6.6 cm. = volume: 310 mL . Echogenicity within normal limits. No mass or hydronephrosis visualized. Left Kidney: Renal measurements: 15.0 x 7.2 x 6.9 cm = volume: 385 mL. Mild hydronephrosis is noted with a prominent extrarenal pelvis. Bladder: Appears normal for degree of bladder distention. Other: None. IMPRESSION: Mild left-sided hydronephrosis is noted. Electronically Signed  By: Inez Catalina M.D.   On: 07/13/2019 10:40   DG CHEST PORT 1 VIEW  Result Date: 07/13/2019 CLINICAL DATA:  Shortness of breath EXAM: PORTABLE CHEST 1 VIEW COMPARISON:  07/13/2019 FINDINGS: The heart size is enlarged. There are scattered airspace opacities primarily at the lung bases which are new from prior study. There is a small left-sided pleural effusion. There is no pneumothorax. No acute osseous abnormality. IMPRESSION: 1. Low lung volumes with probable postoperative atelectasis at the lung bases. 2. Probable small left-sided pleural effusion. 3. Cardiomegaly. Electronically Signed   By: Constance Holster M.D.   On: 07/13/2019 23:52   DG Chest Port 1 View  Result Date: 07/13/2019 CLINICAL DATA:  Atrial fibrillation, hypoxia EXAM: PORTABLE CHEST 1 VIEW COMPARISON:  09/16/2018 FINDINGS: Lungs are clear. No pleural effusion or pneumothorax. Top-normal heart size. IMPRESSION: No acute process in the chest. Electronically Signed   By: Macy Mis M.D.   On: 07/13/2019 07:29   DG Knee Complete 4 Views Left  Result Date: 07/12/2019 CLINICAL DATA:  Knee swelling and redness EXAM: LEFT KNEE - COMPLETE 4+ VIEW COMPARISON:  06/29/2015 FINDINGS: Prior left knee replacement, stable. Moderate joint effusion. No hardware complicating feature. No fracture, subluxation or dislocation. IMPRESSION: Prior left knee replacement. Moderate joint effusion. No acute bony abnormality. Electronically Signed   By: Rolm Baptise M.D.   On:  07/12/2019 19:30   ECHOCARDIOGRAM COMPLETE  Result Date: 07/16/2019   ECHOCARDIOGRAM REPORT   Patient Name:   KIYON MULLIKIN Date of Exam: 07/13/2019 Medical Rec #:  [id number]       Height:       75.0 in Accession #:    [id number]      Weight:       260.0 lb Date of Birth:  1960/09/11      BSA:          2.45 m Patient Age:    73 years        BP:           101/68 mmHg Patient Gender: M               HR:           124 bpm. Exam Location:  Inpatient Procedure: 2D Echo STAT ECHO Indications:     Atrial Fibrillation 427.31/ I48.91  History:         Patient has no prior history of Echocardiogram examinations.                  Risk Factors:Hypertension. Left knee sepitc joint, acute kidney                  injury, allergic reaction to antibiotic, COVID-19 test                  pending,.  Sonographer:     Darlina Sicilian RDCS Referring Phys:  [place] Diagnosing Phys: Eleonore Chiquito MD  Sonographer Comments: Patient condition. IMPRESSIONS  1. Left ventricular ejection fraction, by visual estimation, is 55 to 60%. The left ventricle has normal function. There is mildly increased left ventricular hypertrophy.  2. Left ventricular diastolic function could not be evaluated.  3. Right ventricular volume/pressure overload.  4. The left ventricle demonstrates regional wall motion abnormalities.  5. Global right ventricle has severely reduced systolic function.The right ventricular size is severely enlarged. No increase in right ventricular wall thickness.  6. Left atrial size was normal.  7. Right atrial size  was mild-moderately dilated.  8. Presence of pericardial fat pad.  9. Trivial pericardial effusion is present. 10. Mild mitral annular calcification. 11. The mitral valve is degenerative. Trivial mitral valve regurgitation. 12. The tricuspid valve is grossly normal. Tricuspid valve regurgitation is trivial. 13. The aortic valve is tricuspid. Aortic valve regurgitation is not visualized. No evidence of aortic  valve sclerosis or stenosis. 14. The pulmonic valve was grossly normal. Pulmonic valve regurgitation is not visualized. 15. TR signal is inadequate for assessing pulmonary artery systolic pressure. 16. The inferior vena cava is normal in size with greater than 50% respiratory variability, suggesting right atrial pressure of 3 mmHg. 17. No prior Echocardiogram. FINDINGS  Left Ventricle: Left ventricular ejection fraction, by visual estimation, is 55 to 60%. The left ventricle has normal function. The left ventricle demonstrates regional wall motion abnormalities. The left ventricular internal cavity size was the left ventricle is normal in size. There is mildly increased left ventricular hypertrophy. Abnormal (paradoxical) septal motion, consistent with right ventricular volume overload and/or elevated RV end-diastolic pressure. The left ventricular diastology could not be evaluated due to atrial fibrillation. Left ventricular diastolic function could not be evaluated. Right Ventricle: The right ventricular size is severely enlarged. No increase in right ventricular wall thickness. Global RV systolic function is has severely reduced systolic function. Left Atrium: Left atrial size was normal in size. Right Atrium: Right atrial size was mild-moderately dilated Pericardium: Trivial pericardial effusion is present. Presence of pericardial fat pad. Mitral Valve: The mitral valve is degenerative in appearance. Mild mitral annular calcification. Trivial mitral valve regurgitation. Tricuspid Valve: The tricuspid valve is grossly normal. Tricuspid valve regurgitation is trivial. Aortic Valve: The aortic valve is tricuspid. Aortic valve regurgitation is not visualized. The aortic valve is structurally normal, with no evidence of sclerosis or stenosis. Pulmonic Valve: The pulmonic valve was grossly normal. Pulmonic valve regurgitation is not visualized. Pulmonic regurgitation is not visualized. Aorta: The aortic root and  ascending aorta are structurally normal, with no evidence of dilitation. Venous: The inferior vena cava is normal in size with greater than 50% respiratory variability, suggesting right atrial pressure of 3 mmHg. IAS/Shunts: No atrial level shunt detected by color flow Doppler.  LEFT VENTRICLE PLAX 2D LVIDd:         4.20 cm LVIDs:         2.90 cm LV PW:         1.50 cm LV IVS:        1.40 cm LVOT diam:     1.90 cm LV SV:         46 ml LV SV Index:   18.34 LVOT Area:     2.84 cm  LEFT ATRIUM            Index LA diam:      4.00 cm  1.63 cm/m LA Vol (A4C): 104.0 ml 42.39 ml/m   AORTA Ao Root diam: 2.70 cm MITRAL VALVE MV Area (PHT): 5.34 cm            SHUNTS MV PHT:        41.18 msec          Systemic Diam: 1.90 cm MV Decel Time: 142 msec MV E velocity: 88.83 cm/s 103 cm/s  Eleonore Chiquito MD Electronically signed by Eleonore Chiquito MD Signature Date/Time: 07/13/2019/8:36:27 AM    Final (Updating)    VAS [place] LOWER EXTREMITY VENOUS (DVT)  Result Date: 07/13/2019  Lower Venous Study Indications: Edema.  Comparison Study: no  prior Performing Technologist: Abram Sander RVS  Examination Guidelines: A complete evaluation includes B-mode imaging, spectral Doppler, color Doppler, and power Doppler as needed of all accessible portions of each vessel. Bilateral testing is considered an integral part of a complete examination. Limited examinations for reoccurring indications may be performed as noted.  +---------+---------------+---------+-----------+----------+--------------+ RIGHT    CompressibilityPhasicitySpontaneityPropertiesThrombus Aging +---------+---------------+---------+-----------+----------+--------------+ CFV      Full           Yes      Yes                                 +---------+---------------+---------+-----------+----------+--------------+ SFJ      Full                                                        +---------+---------------+---------+-----------+----------+--------------+  FV Prox  Full                                                        +---------+---------------+---------+-----------+----------+--------------+ FV Mid   Full                                                        +---------+---------------+---------+-----------+----------+--------------+ FV DistalFull                                                        +---------+---------------+---------+-----------+----------+--------------+ PFV      Full                                                        +---------+---------------+---------+-----------+----------+--------------+ POP      Full           Yes      Yes                                 +---------+---------------+---------+-----------+----------+--------------+ PTV      Full                                                        +---------+---------------+---------+-----------+----------+--------------+ PERO     Full                                                        +---------+---------------+---------+-----------+----------+--------------+   +---------+---------------+---------+-----------+----------+--------------+  LEFT     CompressibilityPhasicitySpontaneityPropertiesThrombus Aging +---------+---------------+---------+-----------+----------+--------------+ CFV      Full           Yes      Yes                                 +---------+---------------+---------+-----------+----------+--------------+ SFJ      Full                                                        +---------+---------------+---------+-----------+----------+--------------+ FV Prox  Full                                                        +---------+---------------+---------+-----------+----------+--------------+ FV Mid   Full                                                        +---------+---------------+---------+-----------+----------+--------------+ FV DistalFull                                                         +---------+---------------+---------+-----------+----------+--------------+ PFV      Full                                                        +---------+---------------+---------+-----------+----------+--------------+ POP      Full           Yes      Yes                                 +---------+---------------+---------+-----------+----------+--------------+ PTV      Full                                                        +---------+---------------+---------+-----------+----------+--------------+ PERO                                                  Not visualized +---------+---------------+---------+-----------+----------+--------------+     Summary: Right: There is no evidence of deep vein thrombosis in the lower extremity. No cystic structure found in the popliteal fossa. Left: There is no evidence of deep vein thrombosis in the lower extremity. No cystic structure found in the popliteal fossa.  *See table(s) above for measurements and observations. Electronically  signed by Monica Martinez MD on 07/13/2019 at 12:52:52 PM.    Final    [place] EKG SITE RITE  Result Date: 07/16/2019 If Site Rite image not attached, placement could not be confirmed due to current cardiac rhythm.    Time Spent in minutes  30     Desiree Hane M.D on 07/20/2019 at 7:17 PM  To page go to [email or web address] - password [email or web address]

## 2019-07-20 NOTE — Progress Notes (Signed)
Called by RN to come and check out patient due to him having a rapid respiratory rate. Upon arriving to the room and examining patient, he had BBS clear with no sign of distress. Sats 99% with HR 112-130 in what looks to be A-Fib. Respiratory rate anywhere from 28-43 with no distress noted. Patient is oriented to place and time, but does talk out of his head about the dust on the walls and how he has known me from somewhere else. Talked with RN about patient seeming stable from a respiratory standpoint and no intervention seems neccessary at this time. No further assistance is needed at this time.

## 2019-07-21 ENCOUNTER — Inpatient Hospital Stay (HOSPITAL_COMMUNITY): Payer: Self-pay

## 2019-07-21 ENCOUNTER — Encounter (HOSPITAL_COMMUNITY)
Admission: EM | Disposition: A | Discharge status: Home Health | Payer: Self-pay | Source: Home / Self Care | Attending: Internal Medicine

## 2019-07-21 DIAGNOSIS — M0000 Staphylococcal arthritis, unspecified joint: Secondary | ICD-10-CM

## 2019-07-21 DIAGNOSIS — Z5329 Procedure and treatment not carried out because of patient's decision for other reasons: Secondary | ICD-10-CM

## 2019-07-21 DIAGNOSIS — M7989 Other specified soft tissue disorders: Secondary | ICD-10-CM

## 2019-07-21 DIAGNOSIS — R0682 Tachypnea, not elsewhere classified: Secondary | ICD-10-CM

## 2019-07-21 LAB — BASIC METABOLIC PANEL
Anion gap: 13 (ref 5–15)
BUN: 20 mg/dL (ref 6–20)
CO2: 18 mmol/L — ABNORMAL LOW (ref 22–32)
Calcium: 7.3 mg/dL — ABNORMAL LOW (ref 8.9–10.3)
Chloride: 107 mmol/L (ref 98–111)
Creatinine, Ser: 0.82 mg/dL (ref 0.61–1.24)
GFR calc Af Amer: >60 mL/min (ref >60)
GFR calc non Af Amer: >60 mL/min (ref >60)
Glucose, Bld: 183 mg/dL — ABNORMAL HIGH (ref 70–99)
Potassium: 4.3 mmol/L (ref 3.5–5.1)
Sodium: 138 mmol/L (ref 135–145)

## 2019-07-21 LAB — VANCOMYCIN, PEAK: Vancomycin Pk: 18 ug/mL — ABNORMAL LOW (ref 30–40)

## 2019-07-21 LAB — CBC
HCT: 33.7 % — ABNORMAL LOW (ref 39.0–52.0)
Hemoglobin: 10.9 g/dL — ABNORMAL LOW (ref 13.0–17.0)
MCH: 28.9 pg (ref 26.0–34.0)
MCHC: 32.3 g/dL (ref 30.0–36.0)
MCV: 89.4 fL (ref 80.0–100.0)
Platelets: 293 10*3/uL (ref 150–400)
RBC: 3.77 MIL/uL — ABNORMAL LOW (ref 4.22–5.81)
RDW: 15.4 % (ref 11.5–15.5)
WBC: 17.1 10*3/uL — ABNORMAL HIGH (ref 4.0–10.5)
nRBC: 0 % (ref 0.0–0.2)

## 2019-07-21 SURGERY — CANCELLED PROCEDURE

## 2019-07-21 MED ORDER — SODIUM CHLORIDE 0.9 % IV SOLN: INTRAVENOUS | Status: DC

## 2019-07-21 MED ORDER — DILTIAZEM HCL 60 MG PO TABS
90.0000 mg | ORAL_TABLET | Freq: Four times a day (QID) | ORAL | Status: DC
  Administered 2019-07-21 – 2019-07-26 (×20): 90 mg via ORAL
  Filled 2019-07-21 (×22): qty 2

## 2019-07-21 MED ORDER — APIXABAN 5 MG PO TABS: 5.0000 mg | ORAL_TABLET | Freq: Two times a day (BID) | ORAL | 3 refills | Status: DC

## 2019-07-21 MED ORDER — APIXABAN 5 MG PO TABS
5.0000 mg | ORAL_TABLET | Freq: Two times a day (BID) | ORAL | Status: DC
  Administered 2019-07-21 – 2019-07-22 (×2): 5 mg via ORAL
  Filled 2019-07-21 (×3): qty 1

## 2019-07-21 MED FILL — ELIQUIS 5 MG TABLET: 5 | 30 days supply | Qty: 60 | Fill #0

## 2019-07-21 NOTE — Progress Notes (Addendum)
      INFECTIOUS DISEASE ATTENDING ADDENDUM:   Date: 07/21/2019  Patient name: Vernon Frye  Medical record number: [id number]  Date of birth: 1961-01-08   Diagnosis:  MRSA bacteremia and prosthetic joint infection  Culture Result: MRSA  Allergies  Allergen Reactions  . Vancomycin Shortness Of Breath and Rash    [name]     OPAT Orders Discharge antibiotics:  Vancomycin per pharmacy protocol  Aim for Vancomycin trough 15-20 (unless otherwise indicated)   Duration:  6 weeks  End Date:  January 18th, 2021  [place] Per Protocol:  Labs BI- weekly while on IV antibiotics:  _x_ BMP w GFR _x_ Vancomycin trough  Labs  weekly while on IV antibiotics: _x_ CBC with differential _x_ CRP _x_ ESR    _x_ Please pull PIC at completion of IV antibiotics __ Please leave PIC in place until doctor has seen patient or been notified  Fax weekly labs to [phone]   Vernon Frye has an appointment on 08/09/2019 at [place] for Infectious Disease is located in the [facility] at  [address] in [place].  [address], which is located to the left of the elevators.  Phone: [phone]  Fax: [phone]  [email or web address]   He should arrive 15 minutes prior to the appointment.  Alcide Evener 07/21/2019, 4:42 PM

## 2019-07-21 NOTE — Progress Notes (Signed)
Physical Therapy Treatment Patient Details Name: Vernon Frye MRN: [id number] DOB: June 01, 1961 Today's Date: 07/21/2019    History of Present Illness Pt is a 58 y.o. male admitted 07/12/19 with septic L TKA. S/p I&D and poly exchange on 12/16. Course complicated by AMS/agitation, possible withdrawal. Pt also with c/o back pain; lumbar MRI negative for osteomyelitis or discitis, multilevel degenerative disc disease without neural impingement. PMH includes L TKA (2016), HTN, afib, DDD, anxiety, L shoulder sx.   PT Comments    Pt slowly progressing with mobility. Able to tolerate sitting EOB this session, requiring min-maxA. Noted frequent bowel incontinence; pt better able to participate with bed mobility for washup. Discussed pt's refusal of SNF-level therapies wanting to return home with his mother's assist; pt agrees mother cannot provide level of care he needs, eventually stating he is agreeable to post-acute rehab. Pt with improved ability to follow one step commands, but continues to demonstrate cognitive impairment, including poor attention, problem solving and awareness.    Follow Up Recommendations  SNF;Supervision/Assistance - 24 hour     Equipment Recommendations  (TBD)    Recommendations for Other Services       Precautions / Restrictions Precautions Precautions: Fall;Knee Required Braces or Orthoses: Knee Immobilizer - Left Knee Immobilizer - Left: Other (comment)(present in room, no specific orders)    Mobility  Bed Mobility Overal bed mobility: Needs Assistance Bed Mobility: Rolling;Supine to Sit;Sit to Supine Rolling: Max assist   Supine to sit: Max assist Sit to supine: Max assist   General bed mobility comments: Initial supine<>sit with maxA, pt tolerating well with no c/o pain, maxA for UE support and assisting LLE; noted bowel incontinence once sitting EOB, therefore return to supine. Pt able to roll R/L multiple times for pericare/linen change, assist  strong with UE support on bed rail with max cues  Transfers                 General transfer comment: Deferred. May benefit from attempt with [facility] next session as pt assisting well with BUEs. No recliner in room currently for transfer  Ambulation/Gait                 Stairs             Wheelchair Mobility    Modified Rankin (Stroke Patients Only)       Balance Overall balance assessment: Needs assistance   Sitting balance-Leahy Scale: Poor Sitting balance - Comments: Able to sit EOB ~5 min with min-modA, reliant on UE support                                    Cognition Arousal/Alertness: Awake/alert Behavior During Therapy: Flat affect Overall Cognitive Status: Impaired/Different from baseline Area of Impairment: Orientation;Attention;Memory;Following commands;Safety/judgement;Awareness;Problem solving                   Current Attention Level: Sustained Memory: Decreased short-term memory;Decreased recall of precautions Following Commands: Follows one step commands with increased time;Follows one step commands inconsistently Safety/Judgement: Decreased awareness of safety;Decreased awareness of deficits Awareness: Intellectual Problem Solving: Slow processing;Decreased initiation;Difficulty sequencing;Requires verbal cues;Requires tactile cues General Comments: Did not ask orientation questions. Majority of speech understandable; pt at times mumbling but able to correct/speak up when cued. Pt following ~75% simple commands although poor attention requiring frequent cues to stay on task. Very poor insight into deficits, despite needing maxA for care today pt  still states he's going home with mom's help, even though he just agreed with PT that mom cannot help this much -- "I've got to get to my kids who are locked up" but then clarifying his kids are adults "but they need to get locked up again"      Exercises      General  Comments General comments (skin integrity, edema, etc.): VSS      Pertinent Vitals/Pain Pain Assessment: Faces Pain Score: 2  Faces Pain Scale: Hurts a little bit Pain Location: back Pain Descriptors / Indicators: Grimacing;Guarding Pain Intervention(s): Monitored during session;Repositioned    Home Living                      Prior Function            PT Goals (current goals can now be found in the care plan section) Acute Rehab PT Goals Patient Stated Goal: Now agreeable to post-acute rehab PT Goal Formulation: With patient Time For Goal Achievement: 07/29/19 Potential to Achieve Goals: Fair Progress towards PT goals: Progressing toward goals    Frequency    Min 3X/week      PT Plan Current plan remains appropriate    Co-evaluation              AM-PAC PT "6 Clicks" Mobility   Outcome Measure  Help needed turning from your back to your side while in a flat bed without using bedrails?: A Lot Help needed moving from lying on your back to sitting on the side of a flat bed without using bedrails?: A Lot Help needed moving to and from a bed to a chair (including a wheelchair)?: Total Help needed standing up from a chair using your arms (e.g., wheelchair or bedside chair)?: Total Help needed to walk in hospital room?: Total Help needed climbing 3-5 steps with a railing? : Total 6 Click Score: 8    End of Session   Activity Tolerance: Patient tolerated treatment well;Other (comment);Patient limited by fatigue(frequent bowel incontinence) Patient left: in bed;with call bell/phone within reach;with bed alarm set Nurse Communication: Mobility status;Need for lift equipment PT Visit Diagnosis: Other abnormalities of gait and mobility (R26.89);Pain Pain - Right/Left: Left     Time: [id number] PT Time Calculation (min) (ACUTE ONLY): 26 min  Charges:  $Therapeutic Activity: 23-37 mins                     Mabeline Caras, PT, DPT Acute Rehabilitation  Services  Pager [phone] Office [phone]  Derry Lory 07/21/2019, 1:35 PM

## 2019-07-21 NOTE — Progress Notes (Signed)
[facility]  PROGRESS NOTE  Vernon Frye [id number] DOB: [birth year] DOA: 07/12/2019 PCP: Nicholes Rough, PA-C Admit date - 07/12/2019   Admitting Physician Rush Farmer, MD  Outpatient Primary MD for the patient is Nicholes Rough, PA-C  LOS - 8 Brief Narrative   Vernon Frye is a 58 y.o. year old male with medical history significant for Afib not taking prescribed Eliquis is unable to afford and only on aspirin, anxiety, arthritis, hypertension, left total knee arthroplasty in 2016 who presented on 07/12/2019 with left knee pain and swelling x1 day with inability to bear weight or bend knee and was found to have A. fib with RVR with initial lactic acid of 4.2, and WBC of 16 consistent with sepsis presume related to right knee and required Cardizem infusion for rate control.  Underwent left knee aspiration on 12/16 with 120 cc of purulent synovial fluid, was taken to the OR on 12/16 for septic left total knee replacement by [name].  Monitored in ICU after operation, status post extubation, course complicated by toxic metabolic encephalopathy requiring Precedex infusion in the setting of MRSA bacteremia secondary to septic right knee joint  Hospital course complicated by concern for vancomycin tolerance.  Vanco started in the ED during volume resuscitation patient became erythematous with blanching facial rash extending from neck to clavicle prompting discontinuation of bank and supportive treatment with Benadryl, Pepcid.  Patient became tachypneic and desaturated to 80% start on 3 L nasal cannula for which PCCM was consulted on admission  Subjective  More alert this morning States his left knee pain is okay  A & P   MRSA bacteremia secondary to septic right knee joint, status post washout and antibiotic spacer placement Initially on daptomycin due to concern for allergic drug reaction after vancomycin initiation, currently tolerating vancomycin TTE negative for vegetations.   Underwent emergent source control with operative debridement on 12/16 by orthopedics.  Sepsis physiology resolved.  Repeat blood cultures (12/18) unremarkable MRI lumbar spine negative -Plan for TEE by cardiology on 12/24; however patient wants to discuss with mother, will make n.p.o. at midnight for possible TEE/DCCV on Friday per cardiology -Tolerating IV vancomycin, per ID, to continue both 6 weeks course per ID, OPAT orders in place  Atrial fibrillation with RVR Difficulty in rate control despite oral diltiazem and metoprolol.  Blood pressure range 100-110 over last 24 hours Nonadherent with Eliquis as outpatient Declining TEE/cardioversion today, wants to discuss with his mother -Cardiology to increase Cardizem to 90 mg every 6 hours for now, continue Toprol-XL 20 mg daily -Cardiology plans for TEE/cardioversion, hopefully on Friday pending discussion with family -Continue Lovenox -Started on Eliquis, adherence emphasized by cardiology  Acute toxic metabolic encephalopathy, secondary to agitated delirium, improving.   Presume in the setting of sepsis as well as high concern for polysubstance abuse/alcohol abuse/withdrawal Previous required Precedex infusion in ICU Now alert and oriented x4 alert to self only currently, mental status much more stable no active agitation.  Still seems prone to bouts of delirium/confusion  -continue supplementation with thiamine, folate, multivitamin -Continue scheduled Seroquel -As needed Haldol, Ativan for agitation  Left upper extremity swelling Suspect most like related to IV line site -Venous duplex negative for DVT  Acute respiratory insufficiency with hypoxia, resolved Intubated during operative debridement of knee next abated without difficulty. 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  Now back to baseline 0.93 -Avoid nephrotoxins, monitor BMP, monitor output   Family Communication  : None at bedside  Code Status : Full  Disposition Plan  : TEE once patient discussed with mother, most likely 12/28, continue IV vancomycin, close monitoring with status  Consults  : PCCM, cardiology, ID, orthopedics  Procedures  :  12/16 septic left total knee replacement TTE, 12/19 55-60% Venous DVT, 12/16  Bilateral lower extremities, negative for DVT  DVT Prophylaxis  :  Lovenox  Lab Results  Component Value Date   PLT 293 07/21/2019    Diet :  Diet Order            DIET DYS 3 Room service appropriate? No; Fluid consistency: Thin  Diet effective now               Inpatient Medications Scheduled Meds: . apixaban  5 mg Oral BID  . diltiazem  90 mg Oral Q6H  . docusate sodium  100 mg Oral BID  . folic acid  1 mg Oral Daily  . metoprolol succinate  200 mg Oral Daily  . multivitamin with minerals  1 tablet Oral Daily  . QUEtiapine  25 mg Oral BID  . thiamine  100 mg Oral Daily   Or  . thiamine  100 mg Intravenous Daily   Continuous Infusions: . sodium chloride 50 mL/hr at 07/21/19 0134  . sodium chloride 20 mL/hr at 07/21/19 0556  . vancomycin 1,000 mg (07/21/19 1351)   PRN Meds:.diphenhydrAMINE, haloperidol lactate, LORazepam, menthol-cetylpyridinium **OR** phenol, metoprolol tartrate, ondansetron **OR** ondansetron (ZOFRAN) IV, oxyCODONE, Resource ThickenUp Clear, sodium chloride flush  Antibiotics  :   Anti-infectives (From  admission, onward)   Start     Dose/Rate Route Frequency Ordered Stop   07/19/19 1300  vancomycin (VANCOCIN) IVPB 1000 mg/200 mL premix     1,000 mg 66.7 mL/hr over 180 Minutes Intravenous Every 12 hours 07/19/19 1145     07/14/19 2000  DAPTOmycin (CUBICIN) 1,000 mg in sodium chloride 0.9 % IVPB  Status:  Discontinued     1,000 mg 240 mL/hr over 30 Minutes Intravenous Daily 07/14/19 0807 07/19/19 1145   07/13/19 2119  vancomycin (VANCOCIN) powder  Status:  Discontinued       As needed 07/13/19 2119 07/13/19 2154   07/13/19 1400  DAPTOmycin (CUBICIN) 944 mg in sodium chloride 0.9 % IVPB  Status:  Discontinued     8 mg/kg  118 kg 237.8 mL/hr over 30 Minutes Intravenous Daily 07/13/19 1320 07/14/19 0807   07/13/19 1015  piperacillin-tazobactam (ZOSYN) IVPB 3.375 g  Status:  Discontinued     3.375 g 12.5 mL/hr over 240 Minutes Intravenous Every 8 hours 07/13/19 1002 07/13/19 1535   07/13/19 0400  cefTRIAXone (ROCEPHIN) 2 g in sodium chloride 0.9 % 100 mL IVPB  Status:  Discontinued     2 g 200 mL/hr over 30 Minutes Intravenous Every 24 hours 07/13/19 0337 07/13/19 0951   07/13/19 0400  vancomycin (VANCOCIN) 2,000 mg in sodium chloride 0.9 % 500 mL IVPB     2,000 mg 250 mL/hr over 120 Minutes Intravenous  Once 07/13/19 0339 07/13/19 0707  07/13/19 0339  vancomycin variable dose per unstable renal function (pharmacist dosing)  Status:  Discontinued      Does not apply See admin instructions 07/13/19 0340 07/13/19 0911       Objective   Vitals:   07/21/19 0520 07/21/19 0521 07/21/19 0800 07/21/19 1459  BP:  138/84    Pulse: (!) 104 (!) 107    Resp: (!) 28 (!) 36 (!) 37 (!) 29  Temp:  98.4 F (36.9 C)    TempSrc:  Oral    SpO2: 95% 96%    Weight:  111.9 kg    Height:        SpO2: 96 % O2 Flow Rate (L/min): 0.5 L/min FiO2 (%): 30 %  Wt Readings from Last 3 Encounters:  07/21/19 111.9 kg  09/16/18 117.9 kg  06/19/15 114 kg     Intake/Output Summary (Last 24 hours) at  07/21/2019 1512 Last data filed at 07/21/2019 0522 Gross per 24 hour  Intake 360 ml  Output 1075 ml  Net -715 ml    Physical Exam:  Awake Alert oriented to self, person, place, time No new F.N deficits,  Flushed face Symmetrical Chest wall movement, Good air movement bilaterally on room air, CTAB Irregularly irregular rhythm, tachycardic +ve B.Sounds, Abd Soft, No tenderness, No organomegaly appreciated, No rebound, guarding or rigidity. Left knee wrapped in bandage that is clean dry and intact Significant left-sided swelling of arm from hand to antecubital fossa   I have personally reviewed the following:   Data Reviewed:  CBC Recent Labs  Lab 07/16/19 0344 07/17/19 0503 07/18/19 0233 07/20/19 0222 07/21/19 0622  WBC 19.0* 21.2* 19.1* 21.1* 17.1*  HGB 11.0* 12.1* 11.1* 11.1* 10.9*  HCT 32.8* 37.0* 32.6* 33.4* 33.7*  PLT 127* 192 176 311 293  MCV 88.6 88.9 85.8 88.1 89.4  MCH 29.7 29.1 29.2 29.3 28.9  MCHC 33.5 32.7 34.0 33.2 32.3  RDW 15.3 14.9 14.6 15.1 15.4  LYMPHSABS  --   --   --  2.0  --   MONOABS  --   --   --  0.9  --   EOSABS  --   --   --  0.0  --   BASOSABS  --   --   --  0.1  --     Chemistries  Recent Labs  Lab 07/17/19 1539 07/18/19 0233 07/19/19 0527 07/20/19 0222 07/21/19 0350  NA 142 143 142 140 138  K 3.5 3.9 3.9 3.9 4.3  CL 110 109 109 105 107  CO2 21* 22 20* 20* 18*  GLUCOSE 186* 167* 156* 169* 183*  BUN 46* 38* 31* 22* 20  CREATININE 1.25* 1.00 1.09 0.93 0.82  CALCIUM 7.8* 7.9* 7.7* 7.6* 7.3*   ------------------------------------------------------------------------------------------------------------------ No results for input(s): CHOL, HDL, LDLCALC, TRIG, CHOLHDL, LDLDIRECT in the last 72 hours.  Lab Results  Component Value Date   HGBA1C 6.3 (H) 07/13/2019   ------------------------------------------------------------------------------------------------------------------ No results for input(s): TSH, T4TOTAL, T3FREE,  THYROIDAB in the last 72 hours.  Invalid input(s): FREET3 ------------------------------------------------------------------------------------------------------------------ No results for input(s): VITAMINB12, FOLATE, FERRITIN, TIBC, IRON, RETICCTPCT in the last 72 hours.  Coagulation profile No results for input(s): INR, PROTIME in the last 168 hours.  No results for input(s): DDIMER in the last 72 hours.  Cardiac Enzymes No results for input(s): CKMB, TROPONINI, MYOGLOBIN in the last 168 hours.  Invalid input(s): CK ------------------------------------------------------------------------------------------------------------------    Component Value Date/Time   BNP 299.7 (H) 07/13/2019 0431    Micro  Results Recent Results (from the past 240 hour(s))  Blood Culture (routine x 2)     Status: Abnormal   Collection Time: 07/13/19  1:10 AM   Specimen: BLOOD  Result Value Ref Range Status   Specimen Description BLOOD RIGHT ANTECUBITAL  Final   Special Requests   Final    BOTTLES DRAWN AEROBIC AND ANAEROBIC Blood Culture adequate volume   Culture  Setup Time   Final    GRAM POSITIVE COCCI IN CLUSTERS IN BOTH AEROBIC AND ANAEROBIC BOTTLES CRITICAL RESULT CALLED TO, READ BACK BY AND VERIFIED WITH: Salome Holmes PHARMD 1510 07/13/19 A BROWNING    Culture (A)  Final    STAPHYLOCOCCUS AUREUS SUSCEPTIBILITIES PERFORMED ON PREVIOUS CULTURE WITHIN THE LAST 5 DAYS. Performed at [facility], [place] [address] [place], [place] [address]    Report Status 07/15/2019 FINAL  Final  Blood Culture (routine x 2)     Status: Abnormal   Collection Time: 07/13/19  1:14 AM   Specimen: BLOOD  Result Value Ref Range Status   Specimen Description BLOOD LEFT ANTECUBITAL  Final   Special Requests   Final    BOTTLES DRAWN AEROBIC AND ANAEROBIC Blood Culture adequate volume   Culture  Setup Time   Final    GRAM POSITIVE COCCI IN CLUSTERS IN BOTH AEROBIC AND ANAEROBIC BOTTLES CRITICAL RESULT CALLED  TO, READ BACK BY AND VERIFIED WITH: Salome Holmes [facility] [id number] 07/13/19 A BROWNING Performed at [facility], [place] [address] [place], [place] [address]    Culture METHICILLIN RESISTANT STAPHYLOCOCCUS AUREUS (A)  Final   Report Status 07/15/2019 FINAL  Final   Organism ID, Bacteria METHICILLIN RESISTANT STAPHYLOCOCCUS AUREUS  Final      Susceptibility   Methicillin resistant staphylococcus aureus - MIC*    CIPROFLOXACIN >=8 RESISTANT Resistant     ERYTHROMYCIN >=8 RESISTANT Resistant     GENTAMICIN <=0.5 SENSITIVE Sensitive     OXACILLIN >=4 RESISTANT Resistant     TETRACYCLINE <=1 SENSITIVE Sensitive     VANCOMYCIN <=0.5 SENSITIVE Sensitive     TRIMETH/SULFA <=10 SENSITIVE Sensitive     CLINDAMYCIN <=0.25 SENSITIVE Sensitive     RIFAMPIN <=0.5 SENSITIVE Sensitive     Inducible Clindamycin NEGATIVE Sensitive     * METHICILLIN RESISTANT STAPHYLOCOCCUS AUREUS  Blood Culture ID Panel (Reflexed)     Status: Abnormal   Collection Time: 07/13/19  1:14 AM  Result Value Ref Range Status   Enterococcus species NOT DETECTED NOT DETECTED Final   Listeria monocytogenes NOT DETECTED NOT DETECTED Final   Staphylococcus species DETECTED (A) NOT DETECTED Final    Comment: CRITICAL RESULT CALLED TO, READ BACK BY AND VERIFIED WITH: Salome Holmes PHARMD 1510 07/13/19 A BROWNING    Staphylococcus aureus (BCID) DETECTED (A) NOT DETECTED Final    Comment: Methicillin (oxacillin)-resistant Staphylococcus aureus (MRSA). MRSA is predictably resistant to beta-lactam antibiotics (except ceftaroline). Preferred therapy is vancomycin unless clinically contraindicated. Patient requires contact precautions if  hospitalized. CRITICAL RESULT CALLED TO, READ BACK BY AND VERIFIED WITH: Salome Holmes PHARMD [id number] 07/13/19 A BROWNING    Methicillin resistance DETECTED (A) NOT DETECTED Final    Comment: CRITICAL RESULT CALLED TO, READ BACK BY AND VERIFIED WITH: Salome Holmes PHARMD [id number] 07/13/19 A BROWNING    Streptococcus  species NOT DETECTED NOT DETECTED Final   Streptococcus agalactiae NOT DETECTED NOT DETECTED Final   Streptococcus pneumoniae NOT DETECTED NOT DETECTED Final   Streptococcus pyogenes NOT DETECTED NOT DETECTED Final   Acinetobacter  baumannii NOT DETECTED NOT DETECTED Final   Enterobacteriaceae species NOT DETECTED NOT DETECTED Final   Enterobacter cloacae complex NOT DETECTED NOT DETECTED Final   Escherichia coli NOT DETECTED NOT DETECTED Final   Klebsiella oxytoca NOT DETECTED NOT DETECTED Final   Klebsiella pneumoniae NOT DETECTED NOT DETECTED Final   Proteus species NOT DETECTED NOT DETECTED Final   Serratia marcescens NOT DETECTED NOT DETECTED Final   Haemophilus influenzae NOT DETECTED NOT DETECTED Final   Neisseria meningitidis NOT DETECTED NOT DETECTED Final   Pseudomonas aeruginosa NOT DETECTED NOT DETECTED Final   Candida albicans NOT DETECTED NOT DETECTED Final   Candida glabrata NOT DETECTED NOT DETECTED Final   Candida krusei NOT DETECTED NOT DETECTED Final   Candida parapsilosis NOT DETECTED NOT DETECTED Final   Candida tropicalis NOT DETECTED NOT DETECTED Final    Comment: Performed at [facility], [place] [address] [place], [place] [address]  SARS CORONAVIRUS 2 (TAT 6-24 HRS) Nasopharyngeal Nasopharyngeal Swab     Status: None   Collection Time: 07/13/19  2:57 AM   Specimen: Nasopharyngeal Swab  Result Value Ref Range Status   SARS Coronavirus 2 NEGATIVE NEGATIVE Final    Comment: (NOTE) SARS-CoV-2 target nucleic acids are NOT DETECTED. The SARS-CoV-2 RNA is generally detectable in upper and lower respiratory specimens during the acute phase of infection. Negative results do not preclude SARS-CoV-2 infection, do not rule out co-infections with other pathogens, and should not be used as the sole basis for treatment or other patient management decisions. Negative results must be combined with clinical observations, patient history, and epidemiological information.  The expected result is Negative. Fact Sheet for Patients: SugarRoll.be Fact Sheet for Healthcare Providers: https://www.woods-mathews.com/ This test is not yet approved or cleared by the [place] FDA and  has been authorized for detection and/or diagnosis of SARS-CoV-2 by FDA under an Emergency Use Authorization (EUA). This EUA will remain  in effect (meaning this test can be used) for the duration of the COVID-19 declaration under Section 56 4(b)(1) of the Act, 21 U.S.C. section 360bbb-3(b)(1), unless the authorization is terminated or revoked sooner. Performed at [facility], [place] [address], [place], [place] [address]   Respiratory Panel by RT PCR (Flu A&B, Covid) - Nasopharyngeal Swab     Status: None   Collection Time: 07/13/19  6:56 AM   Specimen: Nasopharyngeal Swab  Result Value Ref Range Status   SARS Coronavirus 2 by RT PCR NEGATIVE NEGATIVE Final    Comment: (NOTE) SARS-CoV-2 target nucleic acids are NOT DETECTED. The SARS-CoV-2 RNA is generally detectable in upper respiratoy specimens during the acute phase of infection. The lowest concentration of SARS-CoV-2 viral copies this assay can detect is 131 copies/mL. A negative result does not preclude SARS-Cov-2 infection and should not be used as the sole basis for treatment or other patient management decisions. A negative result may occur with  improper specimen collection/handling, submission of specimen other than nasopharyngeal swab, presence of viral mutation(s) within the areas targeted by this assay, and inadequate number of viral copies (<131 copies/mL). A negative result must be combined with clinical observations, patient history, and epidemiological information. The expected result is Negative. Fact Sheet for Patients:  PinkCheek.be Fact Sheet for Healthcare Providers:  GravelBags.it This test is not  yet ap proved or cleared by the [place] FDA and  has been authorized for detection and/or diagnosis of SARS-CoV-2 by FDA under an Emergency Use Authorization (EUA). This EUA will remain  in  effect (meaning this test can be used) for the duration of the COVID-19 declaration under Section 564(b)(1) of the Act, 21 U.S.C. section 360bbb-3(b)(1), unless the authorization is terminated or revoked sooner.    Influenza A by PCR NEGATIVE NEGATIVE Final   Influenza B by PCR NEGATIVE NEGATIVE Final    Comment: (NOTE) The Xpert Xpress SARS-CoV-2/FLU/RSV assay is intended as an aid in  the diagnosis of influenza from Nasopharyngeal swab specimens and  should not be used as a sole basis for treatment. Nasal washings and  aspirates are unacceptable for Xpert Xpress SARS-CoV-2/FLU/RSV  testing. Fact Sheet for Patients: PinkCheek.be Fact Sheet for Healthcare Providers: GravelBags.it This test is not yet approved or cleared by the [place] FDA and  has been authorized for detection and/or diagnosis of SARS-CoV-2 by  FDA under an Emergency Use Authorization (EUA). This EUA will remain  in effect (meaning this test can be used) for the duration of the  Covid-19 declaration under Section 564(b)(1) of the Act, 21  U.S.C. section 360bbb-3(b)(1), unless the authorization is  terminated or revoked. Performed at [facility], [place] [address], [place], [place] [address]   Urine culture     Status: None   Collection Time: 07/13/19  8:18 AM   Specimen: In/Out Cath Urine  Result Value Ref Range Status   Specimen Description IN/OUT CATH URINE  Final   Special Requests NONE  Final   Culture   Final    NO GROWTH Performed at [facility], [place] [address], [place], [place] [address]    Report Status 07/14/2019 FINAL  Final  MRSA PCR Screening     Status: Abnormal   Collection Time: 07/13/19 10:59 AM   Specimen: Nasal Mucosa;  Nasopharyngeal  Result Value Ref Range Status   MRSA by PCR POSITIVE (A) NEGATIVE Final    Comment:        The GeneXpert MRSA Assay (FDA approved for NASAL specimens only), is one component of a comprehensive MRSA colonization surveillance program. It is not intended to diagnose MRSA infection nor to guide or monitor treatment for MRSA infections. RESULT CALLED TO, READ BACK BY AND VERIFIED WITH: B. Adonis Brook RN 13:15 07/13/19 ([email or web address]) Performed at [facility], [place] [address] [place], [place] [address]   Body fluid culture     Status: None   Collection Time: 07/13/19  1:29 PM   Specimen: Synovium; Body Fluid  Result Value Ref Range Status   Specimen Description SYNOVIAL KNEE LEFT  Final   Special Requests NONE  Final   Gram Stain   Final    ABUNDANT WBC PRESENT, PREDOMINANTLY MONONUCLEAR ABUNDANT GRAM POSITIVE COCCI Performed at [facility], [place] [address] [place], [place] [address]    Culture   Final    ABUNDANT METHICILLIN RESISTANT STAPHYLOCOCCUS AUREUS   Report Status 07/15/2019 FINAL  Final   Organism ID, Bacteria METHICILLIN RESISTANT STAPHYLOCOCCUS AUREUS  Final      Susceptibility   Methicillin resistant staphylococcus aureus - MIC*    CIPROFLOXACIN >=8 RESISTANT Resistant     ERYTHROMYCIN >=8 RESISTANT Resistant     GENTAMICIN <=0.5 SENSITIVE Sensitive     OXACILLIN >=4 RESISTANT Resistant     TETRACYCLINE <=1 SENSITIVE Sensitive     VANCOMYCIN <=0.5 SENSITIVE Sensitive     TRIMETH/SULFA <=10 SENSITIVE Sensitive     CLINDAMYCIN <=0.25 SENSITIVE Sensitive     RIFAMPIN <=0.5 SENSITIVE Sensitive     Inducible Clindamycin NEGATIVE Sensitive     *  ABUNDANT METHICILLIN RESISTANT STAPHYLOCOCCUS AUREUS  Group A Strep by PCR     Status: None   Collection Time: 07/13/19  4:08 PM   Specimen: Throat; Sterile Swab  Result Value Ref Range Status   Group A Strep by PCR NOT DETECTED NOT DETECTED Final    Comment: Performed at [facility], [place]  [address], [place], [place] [address]  Culture, blood (routine x 2)     Status: Abnormal   Collection Time: 07/13/19  5:14 PM   Specimen: BLOOD RIGHT HAND  Result Value Ref Range Status   Specimen Description BLOOD RIGHT HAND  Final   Special Requests   Final    BOTTLES DRAWN AEROBIC ONLY Blood Culture adequate volume   Culture  Setup Time   Final    GRAM POSITIVE COCCI AEROBIC BOTTLE ONLY CRITICAL VALUE NOTED.  VALUE IS CONSISTENT WITH PREVIOUSLY REPORTED AND CALLED VALUE.    Culture (A)  Final    STAPHYLOCOCCUS AUREUS SUSCEPTIBILITIES PERFORMED ON PREVIOUS CULTURE WITHIN THE LAST 5 DAYS. Performed at [facility], [place] [address], [place], [place] [address]    Report Status 07/17/2019 FINAL  Final  Culture, blood (routine x 2)     Status: Abnormal   Collection Time: 07/13/19  5:14 PM   Specimen: BLOOD RIGHT HAND  Result Value Ref Range Status   Specimen Description BLOOD RIGHT HAND  Final   Special Requests   Final    BOTTLES DRAWN AEROBIC ONLY Blood Culture adequate volume   Culture  Setup Time   Final    GRAM POSITIVE COCCI AEROBIC BOTTLE ONLY CRITICAL VALUE NOTED.  VALUE IS CONSISTENT WITH PREVIOUSLY REPORTED AND CALLED VALUE.    Culture (A)  Final    STAPHYLOCOCCUS AUREUS SUSCEPTIBILITIES PERFORMED ON PREVIOUS CULTURE WITHIN THE LAST 5 DAYS. Performed at [facility], [place] [address] [place], [place] [address]    Report Status 07/18/2019 FINAL  Final  Culture, blood (routine x 2)     Status: Abnormal   Collection Time: 07/14/19  9:50 AM   Specimen: BLOOD  Result Value Ref Range Status   Specimen Description BLOOD RIGHT ANTECUBITAL  Final   Special Requests   Final    BOTTLES DRAWN AEROBIC ONLY Blood Culture adequate volume   Culture  Setup Time   Final    GRAM POSITIVE COCCI AEROBIC BOTTLE ONLY CRITICAL VALUE NOTED.  VALUE IS CONSISTENT WITH PREVIOUSLY REPORTED AND CALLED VALUE.    Culture (A)  Final    STAPHYLOCOCCUS AUREUS SUSCEPTIBILITIES PERFORMED ON  PREVIOUS CULTURE WITHIN THE LAST 5 DAYS. Performed at [facility], [place] [address], [place], [place] [address]    Report Status 07/17/2019 FINAL  Final  Culture, blood (routine x 2)     Status: None   Collection Time: 07/14/19  9:56 AM   Specimen: BLOOD RIGHT HAND  Result Value Ref Range Status   Specimen Description BLOOD RIGHT HAND  Final   Special Requests   Final    BOTTLES DRAWN AEROBIC ONLY Blood Culture results may not be optimal due to an inadequate volume of blood received in culture bottles   Culture   Final    NO GROWTH 5 DAYS Performed at [facility], [place] [address], [place], [place] [address]    Report Status 07/19/2019 FINAL  Final  Culture, blood (routine x 2)     Status: None   Collection Time: 07/15/19 12:21 PM   Specimen: BLOOD RIGHT  HAND  Result Value Ref Range Status   Specimen Description BLOOD RIGHT HAND  Final   Special Requests   Final    BOTTLES DRAWN AEROBIC ONLY Blood Culture results may not be optimal due to an inadequate volume of blood received in culture bottles   Culture   Final    NO GROWTH 5 DAYS Performed at [facility], [place] [address], [place], [place] [address]    Report Status 07/20/2019 FINAL  Final  Culture, blood (routine x 2)     Status: None   Collection Time: 07/15/19  1:20 PM   Specimen: BLOOD RIGHT ARM  Result Value Ref Range Status   Specimen Description BLOOD RIGHT ARM  Final   Special Requests   Final    BOTTLES DRAWN AEROBIC ONLY Blood Culture adequate volume   Culture   Final    NO GROWTH 5 DAYS Performed at [facility], [place] [address], [place], [place] [address]    Report Status 07/20/2019 FINAL  Final    Radiology Reports MR Lumbar Spine W Wo Contrast  Result Date: 07/18/2019 CLINICAL DATA:  Back pain. Sepsis. EXAM: MRI LUMBAR SPINE WITHOUT AND WITH CONTRAST TECHNIQUE: Multiplanar and multiecho pulse sequences of the lumbar spine were obtained without and with intravenous contrast. CONTRAST:   3mL GADAVIST GADOBUTROL 1 MMOL/ML IV SOLN COMPARISON:  None. FINDINGS: Segmentation:  Standard. Alignment:  Physiologic. Vertebrae: There is diffuse mottled low signal throughout the bones of the spine and sacrum. The patient is anemic and this may represent reactivated red marrow. Does the patient have any other hematologic disorders? No fractures or bone destruction. There is a small amount of fluid in the L4-5 and L5-S1 disc spaces. No findings suggestive of osteomyelitis. Conus medullaris and cauda equina: Conus extends to the T12-L1 level. Conus and cauda equina appear normal. Paraspinal and other soft tissues: Negative. Disc levels: L1-2: Normal disc. Slight hypertrophy of the facet joints. L2-3: Tiny central disc bulge with no neural impingement. Slight hypertrophy of the facet joints and ligamentum flavum without neural impingement. L3-4: Tiny central disc bulge with an annular fissure. No neural impingement. Facet joints are normal. L4-5: Chronic degenerative changes of the vertebral endplates. Tiny amount of fluid in the disc space. I doubt that this represents discitis. No significant enhancement after contrast administration. There is a small broad-based disc bulge with accompanying osteophytes extending into the right neural foramen without neural impingement. Slight hypertrophy of the right ligamentum flavum. Facet joints are otherwise normal. L5-S1: Tiny amount of fluid in the left anterolateral aspect of the disc space, likely degenerative in origin. No appreciable enhancement after contrast administration. Tiny broad-based disc bulge without neural impingement. No foraminal stenosis. No facet arthritis. IMPRESSION: 1. No discrete osteomyelitis or discitis in the lumbar spine. Small amounts of fluid in the L4-5 and L5-S1 disc spaces are likely degenerative in origin. 2. Multilevel degenerative disc disease without neural impingement. 3. Diffuse mottled low signal throughout the bones of the spine  and sacrum which may represent reactivated red marrow. The patient is anemic. Electronically Signed   By: Lorriane Shire M.D.   On: 07/18/2019 18:34   US Renal  Result Date: 07/13/2019 CLINICAL DATA:  Acute renal injury EXAM: RENAL / URINARY TRACT ULTRASOUND COMPLETE COMPARISON:  None. FINDINGS: Right Kidney: Renal measurements: 14.7 x 6.1 x 6.6 cm. = volume: 310 mL . Echogenicity within normal limits. No mass or hydronephrosis visualized. Left Kidney: Renal measurements: 15.0 x 7.2 x 6.9 cm =  volume: 385 mL. Mild hydronephrosis is noted with a prominent extrarenal pelvis. Bladder: Appears normal for degree of bladder distention. Other: None. IMPRESSION: Mild left-sided hydronephrosis is noted. Electronically Signed   By: Inez Catalina M.D.   On: 07/13/2019 10:40   DG CHEST PORT 1 VIEW  Result Date: 07/13/2019 CLINICAL DATA:  Shortness of breath EXAM: PORTABLE CHEST 1 VIEW COMPARISON:  07/13/2019 FINDINGS: The heart size is enlarged. There are scattered airspace opacities primarily at the lung bases which are new from prior study. There is a small left-sided pleural effusion. There is no pneumothorax. No acute osseous abnormality. IMPRESSION: 1. Low lung volumes with probable postoperative atelectasis at the lung bases. 2. Probable small left-sided pleural effusion. 3. Cardiomegaly. Electronically Signed   By: Constance Holster M.D.   On: 07/13/2019 23:52   DG Chest Port 1 View  Result Date: 07/13/2019 CLINICAL DATA:  Atrial fibrillation, hypoxia EXAM: PORTABLE CHEST 1 VIEW COMPARISON:  09/16/2018 FINDINGS: Lungs are clear. No pleural effusion or pneumothorax. Top-normal heart size. IMPRESSION: No acute process in the chest. Electronically Signed   By: Macy Mis M.D.   On: 07/13/2019 07:29   DG Knee Complete 4 Views Left  Result Date: 07/12/2019 CLINICAL DATA:  Knee swelling and redness EXAM: LEFT KNEE - COMPLETE 4+ VIEW COMPARISON:  06/29/2015 FINDINGS: Prior left knee replacement, stable.  Moderate joint effusion. No hardware complicating feature. No fracture, subluxation or dislocation. IMPRESSION: Prior left knee replacement. Moderate joint effusion. No acute bony abnormality. Electronically Signed   By: Rolm Baptise M.D.   On: 07/12/2019 19:30   ECHOCARDIOGRAM COMPLETE  Result Date: 07/16/2019   ECHOCARDIOGRAM REPORT   Patient Name:   Vernon Frye Date of Exam: 07/13/2019 Medical Rec #:  [id number]       Height:       75.0 in Accession #:    [id number]      Weight:       260.0 lb Date of Birth:  [birth year]      BSA:          2.45 m Patient Age:    15 years        BP:           101/68 mmHg Patient Gender: M               HR:           124 bpm. Exam Location:  Inpatient Procedure: 2D Echo STAT ECHO Indications:     Atrial Fibrillation 427.31/ I48.91  History:         Patient has no prior history of Echocardiogram examinations.                  Risk Factors:Hypertension. Left knee sepitc joint, acute kidney                  injury, allergic reaction to antibiotic, COVID-19 test                  pending,.  Sonographer:     Darlina Sicilian RDCS Referring Phys:  [place] Diagnosing Phys: Eleonore Chiquito MD  Sonographer Comments: Patient condition. IMPRESSIONS  1. Left ventricular ejection fraction, by visual estimation, is 55 to 60%. The left ventricle has normal function. There is mildly increased left ventricular hypertrophy.  2. Left ventricular diastolic function could not be evaluated.  3. Right ventricular volume/pressure overload.  4. The left ventricle demonstrates regional wall motion abnormalities.  5. Global  right ventricle has severely reduced systolic function.The right ventricular size is severely enlarged. No increase in right ventricular wall thickness.  6. Left atrial size was normal.  7. Right atrial size was mild-moderately dilated.  8. Presence of pericardial fat pad.  9. Trivial pericardial effusion is present. 10. Mild mitral annular calcification. 11. The mitral  valve is degenerative. Trivial mitral valve regurgitation. 12. The tricuspid valve is grossly normal. Tricuspid valve regurgitation is trivial. 13. The aortic valve is tricuspid. Aortic valve regurgitation is not visualized. No evidence of aortic valve sclerosis or stenosis. 14. The pulmonic valve was grossly normal. Pulmonic valve regurgitation is not visualized. 15. TR signal is inadequate for assessing pulmonary artery systolic pressure. 16. The inferior vena cava is normal in size with greater than 50% respiratory variability, suggesting right atrial pressure of 3 mmHg. 17. No prior Echocardiogram. FINDINGS  Left Ventricle: Left ventricular ejection fraction, by visual estimation, is 55 to 60%. The left ventricle has normal function. The left ventricle demonstrates regional wall motion abnormalities. The left ventricular internal cavity size was the left ventricle is normal in size. There is mildly increased left ventricular hypertrophy. Abnormal (paradoxical) septal motion, consistent with right ventricular volume overload and/or elevated RV end-diastolic pressure. The left ventricular diastology could not be evaluated due to atrial fibrillation. Left ventricular diastolic function could not be evaluated. Right Ventricle: The right ventricular size is severely enlarged. No increase in right ventricular wall thickness. Global RV systolic function is has severely reduced systolic function. Left Atrium: Left atrial size was normal in size. Right Atrium: Right atrial size was mild-moderately dilated Pericardium: Trivial pericardial effusion is present. Presence of pericardial fat pad. Mitral Valve: The mitral valve is degenerative in appearance. Mild mitral annular calcification. Trivial mitral valve regurgitation. Tricuspid Valve: The tricuspid valve is grossly normal. Tricuspid valve regurgitation is trivial. Aortic Valve: The aortic valve is tricuspid. Aortic valve regurgitation is not visualized. The aortic  valve is structurally normal, with no evidence of sclerosis or stenosis. Pulmonic Valve: The pulmonic valve was grossly normal. Pulmonic valve regurgitation is not visualized. Pulmonic regurgitation is not visualized. Aorta: The aortic root and ascending aorta are structurally normal, with no evidence of dilitation. Venous: The inferior vena cava is normal in size with greater than 50% respiratory variability, suggesting right atrial pressure of 3 mmHg. IAS/Shunts: No atrial level shunt detected by color flow Doppler.  LEFT VENTRICLE PLAX 2D LVIDd:         4.20 cm LVIDs:         2.90 cm LV PW:         1.50 cm LV IVS:        1.40 cm LVOT diam:     1.90 cm LV SV:         46 ml LV SV Index:   18.34 LVOT Area:     2.84 cm  LEFT ATRIUM            Index LA diam:      4.00 cm  1.63 cm/m LA Vol (A4C): 104.0 ml 42.39 ml/m   AORTA Ao Root diam: 2.70 cm MITRAL VALVE MV Area (PHT): 5.34 cm            SHUNTS MV PHT:        41.18 msec          Systemic Diam: 1.90 cm MV Decel Time: 142 msec MV E velocity: 88.83 cm/s 103 cm/s  Eleonore Chiquito MD Electronically signed by Eleonore Chiquito MD  Signature Date/Time: 07/13/2019/8:36:27 AM    Final (Updating)    VAS [place] LOWER EXTREMITY VENOUS (DVT)  Result Date: 07/13/2019  Lower Venous Study Indications: Edema.  Comparison Study: no prior Performing Technologist: Abram Sander RVS  Examination Guidelines: A complete evaluation includes B-mode imaging, spectral Doppler, color Doppler, and power Doppler as needed of all accessible portions of each vessel. Bilateral testing is considered an integral part of a complete examination. Limited examinations for reoccurring indications may be performed as noted.  +---------+---------------+---------+-----------+----------+--------------+ RIGHT    CompressibilityPhasicitySpontaneityPropertiesThrombus Aging +---------+---------------+---------+-----------+----------+--------------+ CFV      Full           Yes      Yes                                  +---------+---------------+---------+-----------+----------+--------------+ SFJ      Full                                                        +---------+---------------+---------+-----------+----------+--------------+ FV Prox  Full                                                        +---------+---------------+---------+-----------+----------+--------------+ FV Mid   Full                                                        +---------+---------------+---------+-----------+----------+--------------+ FV DistalFull                                                        +---------+---------------+---------+-----------+----------+--------------+ PFV      Full                                                        +---------+---------------+---------+-----------+----------+--------------+ POP      Full           Yes      Yes                                 +---------+---------------+---------+-----------+----------+--------------+ PTV      Full                                                        +---------+---------------+---------+-----------+----------+--------------+ PERO     Full                                                        +---------+---------------+---------+-----------+----------+--------------+   +---------+---------------+---------+-----------+----------+--------------+  LEFT     CompressibilityPhasicitySpontaneityPropertiesThrombus Aging +---------+---------------+---------+-----------+----------+--------------+ CFV      Full           Yes      Yes                                 +---------+---------------+---------+-----------+----------+--------------+ SFJ      Full                                                        +---------+---------------+---------+-----------+----------+--------------+ FV Prox  Full                                                         +---------+---------------+---------+-----------+----------+--------------+ FV Mid   Full                                                        +---------+---------------+---------+-----------+----------+--------------+ FV DistalFull                                                        +---------+---------------+---------+-----------+----------+--------------+ PFV      Full                                                        +---------+---------------+---------+-----------+----------+--------------+ POP      Full           Yes      Yes                                 +---------+---------------+---------+-----------+----------+--------------+ PTV      Full                                                        +---------+---------------+---------+-----------+----------+--------------+ PERO                                                  Not visualized +---------+---------------+---------+-----------+----------+--------------+     Summary: Right: There is no evidence of deep vein thrombosis in the lower extremity. No cystic structure found in the popliteal fossa. Left: There is no evidence of deep vein thrombosis in the lower extremity. No cystic structure found in the popliteal fossa.  *See table(s) above for measurements and observations. Electronically  signed by Monica Martinez MD on 07/13/2019 at 12:52:52 PM.    Final    VAS [place] UPPER EXTREMITY VENOUS DUPLEX  Result Date: 07/21/2019 UPPER VENOUS STUDY  Indications: Swelling Risk Factors: None identified. Limitations: Poor ultrasound/tissue interface and bandages. Comparison Study: No prior studies. Performing Technologist: Oliver Hum RVT  Examination Guidelines: A complete evaluation includes B-mode imaging, spectral Doppler, color Doppler, and power Doppler as needed of all accessible portions of each vessel. Bilateral testing is considered an integral part of a complete examination. Limited examinations  for reoccurring indications may be performed as noted.  Right Findings: +----------+------------+---------+-----------+----------+-------+ RIGHT     CompressiblePhasicitySpontaneousPropertiesSummary +----------+------------+---------+-----------+----------+-------+ Subclavian    Full       Yes       Yes                      +----------+------------+---------+-----------+----------+-------+  Left Findings: +----------+------------+---------+-----------+----------+-------+ LEFT      CompressiblePhasicitySpontaneousPropertiesSummary +----------+------------+---------+-----------+----------+-------+ IJV           Full       Yes       Yes                      +----------+------------+---------+-----------+----------+-------+ Subclavian    Full       Yes       Yes                      +----------+------------+---------+-----------+----------+-------+ Axillary      Full       Yes       Yes                      +----------+------------+---------+-----------+----------+-------+ Brachial      Full       Yes       Yes                      +----------+------------+---------+-----------+----------+-------+ Radial        Full                                          +----------+------------+---------+-----------+----------+-------+ Ulnar         Full                                          +----------+------------+---------+-----------+----------+-------+ Cephalic      None                                   Acute  +----------+------------+---------+-----------+----------+-------+ Basilic       Full                                          +----------+------------+---------+-----------+----------+-------+  Summary:  Right: No evidence of thrombosis in the subclavian.  Left: No evidence of deep vein thrombosis in the upper extremity. Findings consistent with acute superficial vein thrombosis involving the left cephalic vein.  *See table(s) above for  measurements and observations.    Preliminary    [place] EKG SITE RITE  Result Date: 07/16/2019 If Site Rite image not  attached, placement could not be confirmed due to current cardiac rhythm.    Time Spent in minutes  30     Desiree Hane M.D on 07/21/2019 at 3:12 PM  To page go to [email or web address] - password [email or web address]

## 2019-07-21 NOTE — Care Management (Addendum)
07-21-19 [id number] Case Manager received consult for Eliquis. Patient has no insurance at this time. Eliquis will be full price- over $200.00- patient will not be able to afford this medication. Patient will only be eligible for 30 day free card only. No further needs from Case Manager at this time. Bethena Roys, RN, BSN Case Manager [phone]

## 2019-07-21 NOTE — Progress Notes (Signed)
Pts order for BIPAP is PRN. Pt respiratory status is stable at this time. RT will continue to monitor.  

## 2019-07-21 NOTE — Progress Notes (Signed)
TEE/DCCV on Monday 07/25/19 at 1130 with Dr. [name].

## 2019-07-21 NOTE — Plan of Care (Signed)
  Problem: Coping: Goal: Level of anxiety will decrease Outcome: Progressing   Problem: Elimination: Goal: Will not experience complications related to urinary retention Outcome: Progressing   Problem: Pain Managment: Goal: General experience of comfort will improve Outcome: Progressing   

## 2019-07-21 NOTE — Progress Notes (Signed)
[name] for heparin Indication: atrial fibrillation  Allergies  Allergen Reactions  . Vancomycin Shortness Of Breath and Rash    [name]     Patient Measurements: Height: 6\' 3"  (190.5 cm) Weight: 246 lb 11.1 oz (111.9 kg) IBW/kg (Calculated) : 84.5  Vital Signs: Temp: 98.4 F (36.9 C) (12/24 0521) Temp Source: Oral (12/24 0521) BP: 138/84 (12/24 0521) Pulse Rate: 107 (12/24 0521)  Labs: Recent Labs    07/18/19 1451 07/19/19 0527 07/20/19 0222 07/21/19 0350 07/21/19 0622  HGB  --   --  11.1*  --  10.9*  HCT  --   --  33.4*  --  33.7*  PLT  --   --  311  --  293  HEPRLOWMOCWT 0.68  --   --   --   --   CREATININE  --  1.09 0.93 0.82  --     Estimated Creatinine Clearance: 132.6 mL/min (by C-G formula based on SCr of 0.82 mg/dL).  Vernon Frye: 58 y.o. male with a history of atrial fibrillation who presented 07/13/19 with L knee pain and swelling and is now status post irrigation and debridement 12/16 PM. Pharmacy was consulted for IV heparin dosing. Heparin was held for procedure and resumed on AM of 12/19. Note patient was not taking Eliquis prior to admission due to cost.   Due to difficulty with sticks for heparin levels and persistently low heparin levels, pt was transitioned to therapeutic Lovenox on 12/19.  Pharmacy asked to change Lovenox to Eliquis this AM.  Last Lovenox dose last night around midnight.    Goal of Therapy:  Monitor platelets by anticoagulation protocol: Yes   Plan:  Eliquis 5 mg BID Asking case management for assistance with patient affordability, perhaps he can qualify for patient assistance program?  Vernon Frye, [facility] Clinical Pharmacist Phone [phone]  07/21/2019 8:35 AM   .

## 2019-07-21 NOTE — Progress Notes (Signed)
Progress Note  Patient Name: Vernon Frye Date of Encounter: 07/21/2019  Primary Cardiologist: Sinclair Grooms, MD   Subjective   Unable to proceed with TEE/DCCV yesterday due to inability to sign consent and attempts to get in contact with his mother have been unsuccessful.   Today patient is oriented x 2. He refuses to proceed with TEE/DCCV. Has left loeg pain. No chest pain. Still in afib, rates 100-120.  Inpatient Medications    Scheduled Meds: . diltiazem  60 mg Oral Q6H  . docusate sodium  100 mg Oral BID  . enoxaparin (LOVENOX) injection  120 mg Subcutaneous Q12H  . folic acid  1 mg Oral Daily  . metoprolol succinate  200 mg Oral Daily  . multivitamin with minerals  1 tablet Oral Daily  . QUEtiapine  25 mg Oral BID  . thiamine  100 mg Oral Daily   Or  . thiamine  100 mg Intravenous Daily   Continuous Infusions: . sodium chloride 50 mL/hr at 07/21/19 0134  . sodium chloride 20 mL/hr at 07/21/19 0556  . vancomycin 1,000 mg (07/21/19 0135)   PRN Meds: diphenhydrAMINE, haloperidol lactate, LORazepam, menthol-cetylpyridinium **OR** phenol, metoprolol tartrate, ondansetron **OR** ondansetron (ZOFRAN) IV, oxyCODONE, Resource ThickenUp Clear, sodium chloride flush   Vital Signs    Vitals:   07/20/19 2325 07/21/19 0214 07/21/19 0520 07/21/19 0521  BP: (!) 152/99   138/84  Pulse:  100 (!) 104 (!) 107  Resp:  (!) 25 (!) 28 (!) 36  Temp:    98.4 F (36.9 C)  TempSrc:    Oral  SpO2:   95% 96%  Weight:    111.9 kg  Height:        Intake/Output Summary (Last 24 hours) at 07/21/2019 0723 Last data filed at 07/21/2019 0522 Gross per 24 hour  Intake 360 ml  Output 1075 ml  Net -715 ml   Last 3 Weights 07/21/2019 07/20/2019 07/19/2019  Weight (lbs) 246 lb 11.1 oz 257 lb 8 oz 249 lb 12.5 oz  Weight (kg) 111.9 kg 116.8 kg 113.3 kg      Telemetry    Afib, HR 100-120, peak 177, PVCs- Personally Reviewed  ECG    No new - Personally Reviewed  Physical Exam    GEN: No acute distress.   Neck: No JVD Cardiac: Irreg Irreg, no murmurs, rubs, or gallops.  Respiratory: Clear to auscultation bilaterally. GI: Soft, nontender, non-distended  MS: left leg; No deformity. Neuro:  Nonfocal  Psych: Normal affect   Labs    High Sensitivity Troponin:  No results for input(s): TROPONINIHS in the last 720 hours.    Chemistry Recent Labs  Lab 07/19/19 0527 07/20/19 0222 07/21/19 0350  NA 142 140 138  K 3.9 3.9 4.3  CL 109 105 107  CO2 20* 20* 18*  GLUCOSE 156* 169* 183*  BUN 31* 22* 20  CREATININE 1.09 0.93 0.82  CALCIUM 7.7* 7.6* 7.3*  GFRNONAA >60 >60 >60  GFRAA >60 >60 >60  ANIONGAP 13 15 13      Hematology Recent Labs  Lab 07/17/19 0503 07/18/19 0233 07/20/19 0222  WBC 21.2* 19.1* 21.1*  RBC 4.16* 3.80* 3.79*  HGB 12.1* 11.1* 11.1*  HCT 37.0* 32.6* 33.4*  MCV 88.9 85.8 88.1  MCH 29.1 29.2 29.3  MCHC 32.7 34.0 33.2  RDW 14.9 14.6 15.1  PLT 192 176 311    BNPNo results for input(s): BNP, PROBNP in the last 168 hours.   DDimer No results  for input(s): DDIMER in the last 168 hours.   Radiology    No results found.  Cardiac Studies    2D echo 06/2019 IMPRESSIONS   1. Left ventricular ejection fraction, by visual estimation, is 55 to 60%. The left ventricle has normal function. There is mildly increased left ventricular hypertrophy. 2. Left ventricular diastolic function could not be evaluated. 3. Right ventricular volume/pressure overload. 4. The left ventricle demonstrates regional wall motion abnormalities. 5. Global right ventricle has severely reduced systolic function.The right ventricular size is severely enlarged. No increase in right ventricular wall thickness. 6. Left atrial size was normal. 7. Right atrial size was mild-moderately dilated. 8. Presence of pericardial fat pad. 9. Trivial pericardial effusion is present. 10. Mild mitral annular calcification. 11. The mitral valve is degenerative.  Trivial mitral valve regurgitation. 12. The tricuspid valve is grossly normal. Tricuspid valve regurgitation is trivial. 13. The aortic valve is tricuspid. Aortic valve regurgitation is not visualized. No evidence of aortic valve sclerosis or stenosis. 14. The pulmonic valve was grossly normal. Pulmonic valve regurgitation is not visualized. 15. TR signal is inadequate for assessing pulmonary artery systolic pressure. 16. The inferior vena cava is normal in size with greater than 50% respiratory variability, suggesting right atrial pressure of 3 mmHg. 17. No prior Echocardiogram.  Patient Profile     58 y.o. male with recent septic knee (prosthetic joint infection), MRSA bacteremia who is being followed for post-op afib with RVR  Assessment & Plan    Atrial fibrillation with RVR:  -likely exacerbated by sepsis and immediate postoperative period -was on heparin for anticoagulation. Notes suggest previously nonadherent to apixaban. Now on therapeutic Lovenox -received IV amiodarone post op then this was stopped -went back in to afib with RVR and placed on Cardizem gtt>>transitioned to PO -team discussed TEE/DCCVover the weekendsince he needs TEE anyway to eval for endocarditis and patient who refused  -We discussed TEE/DCCV again and he was willing to proceed with the procedure. However he was unable to sign the consent and attempts to get with his mother have been unsuccessful. Today the patient refuses to proceed with TEE/DCCV -continue Toprol XL 200mg  daily -Continue Cardizem  Sepsis, MRSA bacteremia:  -s/p irrigation and debridement, antibiotic spacer placement -ID following, on daptomycin -TTE as above.  -TEE when mental status is clear/prior to discharge  HTN -BP poorly controlled -Continue Toprol XL 200mg  daily -continue cardizem   For questions or updates, please contact [place] Please consult [email or web address] for contact info under        Signed, Valentin Benney Ninfa Meeker, PA-C  07/21/2019, 7:23 AM

## 2019-07-21 NOTE — Plan of Care (Signed)
  Problem: Clinical Measurements: Goal: Respiratory complications will improve Outcome: Progressing Goal: Cardiovascular complication will be avoided Outcome: Progressing   

## 2019-07-21 NOTE — Progress Notes (Signed)
  Speech Language Pathology Treatment: Dysphagia  Patient Details Name: Vernon Frye MRN: [id number] DOB: 11/09/1960 Today's Date: 07/21/2019 Time: [id number] SLP Time Calculation (min) (ACUTE ONLY): 26 min  Assessment / Plan / Recommendation Clinical Impression  Patient seen at bedside for skilled treatment of dysphagia, trials of PO in consideration of diet upgrade to regular. Patient alert and cooperative, though demonstrated some impulsivity and inattention. Pt responsive to verbal re-direction and able to follow simple verbal commands. Pt seen with breakfast tray (D3/thin): banana, cut sausage, cut pancakes, orange juice, water and coffee. Patient with good oral acceptance of all solid boluses (banana, sausage and pancake), moderately prolonged mastication occurring primarily on R side, some mild pocketing in right buccal area and mild residue on central sulcus of tongue. Patient provided verbal cues to use lingual sweep to check for pocketing, he was able to clear oral residue using this strategy. Patient self administered cup sips of thin liquid, had one instance of (strong) delayed cough following swallow of pancake followed by cup sip of water. Pt reported cough was from bits of food. Pt with a few mild coughs and throat clears t/o session that he attributed to phlegm. Pt was able to expectorate phlegm into empty cup x1.  SLP provided regular solid (graham cracker) for trial. Pt with moderately prolonged mastication, independently utilized liquid rinse (water) to presumably moisten cracker, able to swallow with no oral residue noted. SLP initiated discussion with pt re: regular versus dysphagia 3 diet. Pt reports he "cuts up his food" at home. Patient stated he did not have a preference between regular and dysphagia 3 solids after being provided a description. SLP asked pt to cut piece of pancake, pt with difficulty doing so. Combined with patients prolonged mastication, impulsivity and  attention difficulties, recommend pt remain on current diet with plan to continue trials for tolerance to upgrade.  SLP provided education to pt re: remaining on current dysphagia 3 diet and continuing trials of regular, patient verbally agreed to this plan.   HPI        SLP Plan  Continue with current plan of care       Recommendations  Diet recommendations: Dysphagia 3 (mechanical soft) Liquids provided via: Cup;No straw Medication Administration: Whole meds with puree Supervision: Full supervision/cueing for compensatory strategies;Patient able to self feed Compensations: Slow rate;Minimize environmental distractions;Small sips/bites;Lingual sweep for clearance of pocketing;Clear throat intermittently Postural Changes and/or Swallow Maneuvers: Seated upright 90 degrees                Oral Care Recommendations: Oral care QID;Oral care before and after PO SLP Visit Diagnosis: Dysphagia, unspecified (R13.10) Plan: Continue with current plan of care       Brooks, M.Ed., CCC-SLP Acute Rehabilitation  Speech Therapy  07/21/2019, 10:28 AM

## 2019-07-21 NOTE — Progress Notes (Signed)
Left upper extremity venous duplex has been completed. Preliminary results can be found in CV Proc through chart review.  Results were given to the patient's nurse, Marya Amsler.  07/21/19 9:33 AM Vernon Frye RVT

## 2019-07-21 NOTE — Progress Notes (Signed)
Subjective:  No new complaints, he refused DC cardioversion  Antibiotics:  Anti-infectives (From admission, onward)   Start     Dose/Rate Route Frequency Ordered Stop   07/19/19 1300  vancomycin (VANCOCIN) IVPB 1000 mg/200 mL premix     1,000 mg 66.7 mL/hr over 180 Minutes Intravenous Every 12 hours 07/19/19 1145     07/14/19 2000  DAPTOmycin (CUBICIN) 1,000 mg in sodium chloride 0.9 % IVPB  Status:  Discontinued     1,000 mg 240 mL/hr over 30 Minutes Intravenous Daily 07/14/19 0807 07/19/19 1145   07/13/19 2119  vancomycin (VANCOCIN) powder  Status:  Discontinued       As needed 07/13/19 2119 07/13/19 2154   07/13/19 1400  DAPTOmycin (CUBICIN) 944 mg in sodium chloride 0.9 % IVPB  Status:  Discontinued     8 mg/kg  118 kg 237.8 mL/hr over 30 Minutes Intravenous Daily 07/13/19 1320 07/14/19 0807   07/13/19 1015  piperacillin-tazobactam (ZOSYN) IVPB 3.375 g  Status:  Discontinued     3.375 g 12.5 mL/hr over 240 Minutes Intravenous Every 8 hours 07/13/19 1002 07/13/19 1535   07/13/19 0400  cefTRIAXone (ROCEPHIN) 2 g in sodium chloride 0.9 % 100 mL IVPB  Status:  Discontinued     2 g 200 mL/hr over 30 Minutes Intravenous Every 24 hours 07/13/19 0337 07/13/19 0951   07/13/19 0400  vancomycin (VANCOCIN) 2,000 mg in sodium chloride 0.9 % 500 mL IVPB     2,000 mg 250 mL/hr over 120 Minutes Intravenous  Once 07/13/19 0339 07/13/19 0707   07/13/19 0339  vancomycin variable dose per unstable renal function (pharmacist dosing)  Status:  Discontinued      Does not apply See admin instructions 07/13/19 0340 07/13/19 0911      Medications: Scheduled Meds: . apixaban  5 mg Oral BID  . diltiazem  90 mg Oral Q6H  . docusate sodium  100 mg Oral BID  . folic acid  1 mg Oral Daily  . metoprolol succinate  200 mg Oral Daily  . multivitamin with minerals  1 tablet Oral Daily  . QUEtiapine  25 mg Oral BID  . thiamine  100 mg Oral Daily   Or  . thiamine  100 mg Intravenous Daily    Continuous Infusions: . sodium chloride 50 mL/hr at 07/21/19 0134  . sodium chloride 20 mL/hr at 07/21/19 0556  . vancomycin 1,000 mg (07/21/19 1351)   PRN Meds:.diphenhydrAMINE, haloperidol lactate, LORazepam, menthol-cetylpyridinium **OR** phenol, metoprolol tartrate, ondansetron **OR** ondansetron (ZOFRAN) IV, oxyCODONE, Resource ThickenUp Clear, sodium chloride flush    Objective: Weight change: -4.9 kg  Intake/Output Summary (Last 24 hours) at 07/21/2019 1411 Last data filed at 07/21/2019 0522 Gross per 24 hour  Intake 360 ml  Output 1075 ml  Net -715 ml   Blood pressure 138/84, pulse (!) 107, temperature 98.4 F (36.9 C), temperature source Oral, resp. rate (!) 36, height 6\' 3"  (1.905 m), weight 111.9 kg, SpO2 96 %. Temp:  [97.8 F (36.6 C)-98.4 F (36.9 C)] 98.4 F (36.9 C) (12/24 0521) Pulse Rate:  [75-107] 107 (12/24 0521) Resp:  [25-38] 36 (12/24 0521) BP: (138-152)/(84-99) 138/84 (12/24 0521) SpO2:  [95 %-100 %] 96 % (12/24 0521) Weight:  [111.9 kg] 111.9 kg (12/24 0521)  Physical Exam: General: Alert oriented person and place, time HEENT: anicteric sclera, EOMI CVS tachycardic  Chest:  No resp distress Abdomen: soft non-distended,  Extremities: Left knee with bandage his legs have fairly  sick significant 3+ pitting edema skin: no rashes Neuro: nonfocal  CBC:    BMET Recent Labs    07/20/19 0222 07/21/19 0350  NA 140 138  K 3.9 4.3  CL 105 107  CO2 20* 18*  GLUCOSE 169* 183*  BUN 22* 20  CREATININE 0.93 0.82  CALCIUM 7.6* 7.3*     Liver Panel  No results for input(s): PROT, ALBUMIN, AST, ALT, ALKPHOS, BILITOT, BILIDIR, IBILI in the last 72 hours.     Sedimentation Rate No results for input(s): ESRSEDRATE in the last 72 hours. C-Reactive Protein No results for input(s): CRP in the last 72 hours.  Micro Results: Recent Results (from the past 720 hour(s))  Blood Culture (routine x 2)     Status: Abnormal   Collection Time:  07/13/19  1:10 AM   Specimen: BLOOD  Result Value Ref Range Status   Specimen Description BLOOD RIGHT ANTECUBITAL  Final   Special Requests   Final    BOTTLES DRAWN AEROBIC AND ANAEROBIC Blood Culture adequate volume   Culture  Setup Time   Final    GRAM POSITIVE COCCI IN CLUSTERS IN BOTH AEROBIC AND ANAEROBIC BOTTLES CRITICAL RESULT CALLED TO, READ BACK BY AND VERIFIED WITH: Salome Holmes PHARMD 1510 07/13/19 A BROWNING    Culture (A)  Final    STAPHYLOCOCCUS AUREUS SUSCEPTIBILITIES PERFORMED ON PREVIOUS CULTURE WITHIN THE LAST 5 DAYS. Performed at [facility], [place] [address] [place], [place] [address]    Report Status 07/15/2019 FINAL  Final  Blood Culture (routine x 2)     Status: Abnormal   Collection Time: 07/13/19  1:14 AM   Specimen: BLOOD  Result Value Ref Range Status   Specimen Description BLOOD LEFT ANTECUBITAL  Final   Special Requests   Final    BOTTLES DRAWN AEROBIC AND ANAEROBIC Blood Culture adequate volume   Culture  Setup Time   Final    GRAM POSITIVE COCCI IN CLUSTERS IN BOTH AEROBIC AND ANAEROBIC BOTTLES CRITICAL RESULT CALLED TO, READ BACK BY AND VERIFIED WITH: Salome Holmes [facility] [id number] 07/13/19 A BROWNING Performed at [facility], [place] [address] [place], [place] [address]    Culture METHICILLIN RESISTANT STAPHYLOCOCCUS AUREUS (A)  Final   Report Status 07/15/2019 FINAL  Final   Organism ID, Bacteria METHICILLIN RESISTANT STAPHYLOCOCCUS AUREUS  Final      Susceptibility   Methicillin resistant staphylococcus aureus - MIC*    CIPROFLOXACIN >=8 RESISTANT Resistant     ERYTHROMYCIN >=8 RESISTANT Resistant     GENTAMICIN <=0.5 SENSITIVE Sensitive     OXACILLIN >=4 RESISTANT Resistant     TETRACYCLINE <=1 SENSITIVE Sensitive     VANCOMYCIN <=0.5 SENSITIVE Sensitive     TRIMETH/SULFA <=10 SENSITIVE Sensitive     CLINDAMYCIN <=0.25 SENSITIVE Sensitive     RIFAMPIN <=0.5 SENSITIVE Sensitive     Inducible Clindamycin NEGATIVE Sensitive     *  METHICILLIN RESISTANT STAPHYLOCOCCUS AUREUS  Blood Culture ID Panel (Reflexed)     Status: Abnormal   Collection Time: 07/13/19  1:14 AM  Result Value Ref Range Status   Enterococcus species NOT DETECTED NOT DETECTED Final   Listeria monocytogenes NOT DETECTED NOT DETECTED Final   Staphylococcus species DETECTED (A) NOT DETECTED Final    Comment: CRITICAL RESULT CALLED TO, READ BACK BY AND VERIFIED WITH: Salome Holmes PHARMD 1510 07/13/19 A BROWNING    Staphylococcus aureus (BCID) DETECTED (A) NOT DETECTED Final    Comment: Methicillin (oxacillin)-resistant Staphylococcus aureus (MRSA).  MRSA is predictably resistant to beta-lactam antibiotics (except ceftaroline). Preferred therapy is vancomycin unless clinically contraindicated. Patient requires contact precautions if  hospitalized. CRITICAL RESULT CALLED TO, READ BACK BY AND VERIFIED WITH: Salome Holmes PHARMD [id number] 07/13/19 A BROWNING    Methicillin resistance DETECTED (A) NOT DETECTED Final    Comment: CRITICAL RESULT CALLED TO, READ BACK BY AND VERIFIED WITH: Salome Holmes PHARMD 1510 07/13/19 A BROWNING    Streptococcus species NOT DETECTED NOT DETECTED Final   Streptococcus agalactiae NOT DETECTED NOT DETECTED Final   Streptococcus pneumoniae NOT DETECTED NOT DETECTED Final   Streptococcus pyogenes NOT DETECTED NOT DETECTED Final   Acinetobacter baumannii NOT DETECTED NOT DETECTED Final   Enterobacteriaceae species NOT DETECTED NOT DETECTED Final   Enterobacter cloacae complex NOT DETECTED NOT DETECTED Final   Escherichia coli NOT DETECTED NOT DETECTED Final   Klebsiella oxytoca NOT DETECTED NOT DETECTED Final   Klebsiella pneumoniae NOT DETECTED NOT DETECTED Final   Proteus species NOT DETECTED NOT DETECTED Final   Serratia marcescens NOT DETECTED NOT DETECTED Final   Haemophilus influenzae NOT DETECTED NOT DETECTED Final   Neisseria meningitidis NOT DETECTED NOT DETECTED Final   Pseudomonas aeruginosa NOT DETECTED NOT DETECTED Final    Candida albicans NOT DETECTED NOT DETECTED Final   Candida glabrata NOT DETECTED NOT DETECTED Final   Candida krusei NOT DETECTED NOT DETECTED Final   Candida parapsilosis NOT DETECTED NOT DETECTED Final   Candida tropicalis NOT DETECTED NOT DETECTED Final    Comment: Performed at [facility], [place]. [address] [place], [place] [address]  SARS CORONAVIRUS 2 (TAT 6-24 HRS) Nasopharyngeal Nasopharyngeal Swab     Status: None   Collection Time: 07/13/19  2:57 AM   Specimen: Nasopharyngeal Swab  Result Value Ref Range Status   SARS Coronavirus 2 NEGATIVE NEGATIVE Final    Comment: (NOTE) SARS-CoV-2 target nucleic acids are NOT DETECTED. The SARS-CoV-2 RNA is generally detectable in upper and lower respiratory specimens during the acute phase of infection. Negative results do not preclude SARS-CoV-2 infection, do not rule out co-infections with other pathogens, and should not be used as the sole basis for treatment or other patient management decisions. Negative results must be combined with clinical observations, patient history, and epidemiological information. The expected result is Negative. Fact Sheet for Patients: SugarRoll.be Fact Sheet for Healthcare Providers: https://www.woods-mathews.com/ This test is not yet approved or cleared by the [place] FDA and  has been authorized for detection and/or diagnosis of SARS-CoV-2 by FDA under an Emergency Use Authorization (EUA). This EUA will remain  in effect (meaning this test can be used) for the duration of the COVID-19 declaration under Section 56 4(b)(1) of the Act, 21 U.S.C. section 360bbb-3(b)(1), unless the authorization is terminated or revoked sooner. Performed at [facility], [place] [address] [place], [place] [address]   Respiratory Panel by RT PCR (Flu A&B, Covid) - Nasopharyngeal Swab     Status: None   Collection Time: 07/13/19  6:56 AM   Specimen: Nasopharyngeal  Swab  Result Value Ref Range Status   SARS Coronavirus 2 by RT PCR NEGATIVE NEGATIVE Final    Comment: (NOTE) SARS-CoV-2 target nucleic acids are NOT DETECTED. The SARS-CoV-2 RNA is generally detectable in upper respiratoy specimens during the acute phase of infection. The lowest concentration of SARS-CoV-2 viral copies this assay can detect is 131 copies/mL. A negative result does not preclude SARS-Cov-2 infection and should not be used as the sole basis for treatment or  other patient management decisions. A negative result may occur with  improper specimen collection/handling, submission of specimen other than nasopharyngeal swab, presence of viral mutation(s) within the areas targeted by this assay, and inadequate number of viral copies (<131 copies/mL). A negative result must be combined with clinical observations, patient history, and epidemiological information. The expected result is Negative. Fact Sheet for Patients:  PinkCheek.be Fact Sheet for Healthcare Providers:  GravelBags.it This test is not yet ap proved or cleared by the [place] FDA and  has been authorized for detection and/or diagnosis of SARS-CoV-2 by FDA under an Emergency Use Authorization (EUA). This EUA will remain  in effect (meaning this test can be used) for the duration of the COVID-19 declaration under Section 564(b)(1) of the Act, 21 U.S.C. section 360bbb-3(b)(1), unless the authorization is terminated or revoked sooner.    Influenza A by PCR NEGATIVE NEGATIVE Final   Influenza B by PCR NEGATIVE NEGATIVE Final    Comment: (NOTE) The Xpert Xpress SARS-CoV-2/FLU/RSV assay is intended as an aid in  the diagnosis of influenza from Nasopharyngeal swab specimens and  should not be used as a sole basis for treatment. Nasal washings and  aspirates are unacceptable for Xpert Xpress SARS-CoV-2/FLU/RSV  testing. Fact Sheet for  Patients: PinkCheek.be Fact Sheet for Healthcare Providers: GravelBags.it This test is not yet approved or cleared by the [place] FDA and  has been authorized for detection and/or diagnosis of SARS-CoV-2 by  FDA under an Emergency Use Authorization (EUA). This EUA will remain  in effect (meaning this test can be used) for the duration of the  Covid-19 declaration under Section 564(b)(1) of the Act, 21  U.S.C. section 360bbb-3(b)(1), unless the authorization is  terminated or revoked. Performed at [facility], [place] [address], [place], [place] [address]   Urine culture     Status: None   Collection Time: 07/13/19  8:18 AM   Specimen: In/Out Cath Urine  Result Value Ref Range Status   Specimen Description IN/OUT CATH URINE  Final   Special Requests NONE  Final   Culture   Final    NO GROWTH Performed at [facility], [place] [address], [place], [place] [address]    Report Status 07/14/2019 FINAL  Final  MRSA PCR Screening     Status: Abnormal   Collection Time: 07/13/19 10:59 AM   Specimen: Nasal Mucosa; Nasopharyngeal  Result Value Ref Range Status   MRSA by PCR POSITIVE (A) NEGATIVE Final    Comment:        The GeneXpert MRSA Assay (FDA approved for NASAL specimens only), is one component of a comprehensive MRSA colonization surveillance program. It is not intended to diagnose MRSA infection nor to guide or monitor treatment for MRSA infections. RESULT CALLED TO, READ BACK BY AND VERIFIED WITH: B. Adonis [place] RN 13:15 07/13/19 ([email or web address]) Performed at [facility], [place] [address], [place], [place] [address]   Body fluid culture     Status: None   Collection Time: 07/13/19  1:29 PM   Specimen: Synovium; Body Fluid  Result Value Ref Range Status   Specimen Description SYNOVIAL KNEE LEFT  Final   Special Requests NONE  Final   Gram Stain   Final    ABUNDANT WBC PRESENT, PREDOMINANTLY  MONONUCLEAR ABUNDANT GRAM POSITIVE COCCI Performed at [facility], [place] [address], [place], [place] [address]    Culture   Final    ABUNDANT METHICILLIN RESISTANT STAPHYLOCOCCUS AUREUS  Report Status 07/15/2019 FINAL  Final   Organism ID, Bacteria METHICILLIN RESISTANT STAPHYLOCOCCUS AUREUS  Final      Susceptibility   Methicillin resistant staphylococcus aureus - MIC*    CIPROFLOXACIN >=8 RESISTANT Resistant     ERYTHROMYCIN >=8 RESISTANT Resistant     GENTAMICIN <=0.5 SENSITIVE Sensitive     OXACILLIN >=4 RESISTANT Resistant     TETRACYCLINE <=1 SENSITIVE Sensitive     VANCOMYCIN <=0.5 SENSITIVE Sensitive     TRIMETH/SULFA <=10 SENSITIVE Sensitive     CLINDAMYCIN <=0.25 SENSITIVE Sensitive     RIFAMPIN <=0.5 SENSITIVE Sensitive     Inducible Clindamycin NEGATIVE Sensitive     * ABUNDANT METHICILLIN RESISTANT STAPHYLOCOCCUS AUREUS  Group A Strep by PCR     Status: None   Collection Time: 07/13/19  4:08 PM   Specimen: Throat; Sterile Swab  Result Value Ref Range Status   Group A Strep by PCR NOT DETECTED NOT DETECTED Final    Comment: Performed at [facility], [place] [address], [place], [place] [address]  Culture, blood (routine x 2)     Status: Abnormal   Collection Time: 07/13/19  5:14 PM   Specimen: BLOOD RIGHT HAND  Result Value Ref Range Status   Specimen Description BLOOD RIGHT HAND  Final   Special Requests   Final    BOTTLES DRAWN AEROBIC ONLY Blood Culture adequate volume   Culture  Setup Time   Final    GRAM POSITIVE COCCI AEROBIC BOTTLE ONLY CRITICAL VALUE NOTED.  VALUE IS CONSISTENT WITH PREVIOUSLY REPORTED AND CALLED VALUE.    Culture (A)  Final    STAPHYLOCOCCUS AUREUS SUSCEPTIBILITIES PERFORMED ON PREVIOUS CULTURE WITHIN THE LAST 5 DAYS. Performed at [facility], [place] [address] [place], [place] [address]    Report Status 07/17/2019 FINAL  Final  Culture, blood (routine x 2)     Status: Abnormal   Collection Time: 07/13/19  5:14 PM    Specimen: BLOOD RIGHT HAND  Result Value Ref Range Status   Specimen Description BLOOD RIGHT HAND  Final   Special Requests   Final    BOTTLES DRAWN AEROBIC ONLY Blood Culture adequate volume   Culture  Setup Time   Final    GRAM POSITIVE COCCI AEROBIC BOTTLE ONLY CRITICAL VALUE NOTED.  VALUE IS CONSISTENT WITH PREVIOUSLY REPORTED AND CALLED VALUE.    Culture (A)  Final    STAPHYLOCOCCUS AUREUS SUSCEPTIBILITIES PERFORMED ON PREVIOUS CULTURE WITHIN THE LAST 5 DAYS. Performed at [facility], [place] [address], [place], [place] [address]    Report Status 07/18/2019 FINAL  Final  Culture, blood (routine x 2)     Status: Abnormal   Collection Time: 07/14/19  9:50 AM   Specimen: BLOOD  Result Value Ref Range Status   Specimen Description BLOOD RIGHT ANTECUBITAL  Final   Special Requests   Final    BOTTLES DRAWN AEROBIC ONLY Blood Culture adequate volume   Culture  Setup Time   Final    GRAM POSITIVE COCCI AEROBIC BOTTLE ONLY CRITICAL VALUE NOTED.  VALUE IS CONSISTENT WITH PREVIOUSLY REPORTED AND CALLED VALUE.    Culture (A)  Final    STAPHYLOCOCCUS AUREUS SUSCEPTIBILITIES PERFORMED ON PREVIOUS CULTURE WITHIN THE LAST 5 DAYS. Performed at [facility], [place] [address], [place], [place] [address]    Report Status 07/17/2019 FINAL  Final  Culture, blood (routine x 2)     Status: None   Collection Time: 07/14/19  9:56 AM   Specimen: BLOOD RIGHT HAND  Result Value Ref Range Status   Specimen Description BLOOD RIGHT HAND  Final   Special Requests   Final    BOTTLES DRAWN AEROBIC ONLY Blood Culture results may not be optimal due to an inadequate volume of blood received in culture bottles   Culture   Final    NO GROWTH 5 DAYS Performed at [facility], [place] [address] [place], [place] [address]    Report Status 07/19/2019 FINAL  Final  Culture, blood (routine x 2)     Status: None   Collection Time: 07/15/19 12:21 PM   Specimen: BLOOD RIGHT HAND  Result Value Ref  Range Status   Specimen Description BLOOD RIGHT HAND  Final   Special Requests   Final    BOTTLES DRAWN AEROBIC ONLY Blood Culture results may not be optimal due to an inadequate volume of blood received in culture bottles   Culture   Final    NO GROWTH 5 DAYS Performed at [facility], [place] [address] [place], [place] [address]    Report Status 07/20/2019 FINAL  Final  Culture, blood (routine x 2)     Status: None   Collection Time: 07/15/19  1:20 PM   Specimen: BLOOD RIGHT ARM  Result Value Ref Range Status   Specimen Description BLOOD RIGHT ARM  Final   Special Requests   Final    BOTTLES DRAWN AEROBIC ONLY Blood Culture adequate volume   Culture   Final    NO GROWTH 5 DAYS Performed at [facility], [place] [address], [place], [place] [address]    Report Status 07/20/2019 FINAL  Final    Studies/Results: VAS [place] UPPER EXTREMITY VENOUS DUPLEX  Result Date: 07/21/2019 UPPER VENOUS STUDY  Indications: Swelling Risk Factors: None identified. Limitations: Poor ultrasound/tissue interface and bandages. Comparison Study: No prior studies. Performing Technologist: Oliver Hum RVT  Examination Guidelines: A complete evaluation includes B-mode imaging, spectral Doppler, color Doppler, and power Doppler as needed of all accessible portions of each vessel. Bilateral testing is considered an integral part of a complete examination. Limited examinations for reoccurring indications may be performed as noted.  Right Findings: +----------+------------+---------+-----------+----------+-------+ RIGHT     CompressiblePhasicitySpontaneousPropertiesSummary +----------+------------+---------+-----------+----------+-------+ Subclavian    Full       Yes       Yes                      +----------+------------+---------+-----------+----------+-------+  Left Findings: +----------+------------+---------+-----------+----------+-------+ LEFT       CompressiblePhasicitySpontaneousPropertiesSummary +----------+------------+---------+-----------+----------+-------+ IJV           Full       Yes       Yes                      +----------+------------+---------+-----------+----------+-------+ Subclavian    Full       Yes       Yes                      +----------+------------+---------+-----------+----------+-------+ Axillary      Full       Yes       Yes                      +----------+------------+---------+-----------+----------+-------+ Brachial      Full       Yes       Yes                      +----------+------------+---------+-----------+----------+-------+  Radial        Full                                          +----------+------------+---------+-----------+----------+-------+ Ulnar         Full                                          +----------+------------+---------+-----------+----------+-------+ Cephalic      None                                   Acute  +----------+------------+---------+-----------+----------+-------+ Basilic       Full                                          +----------+------------+---------+-----------+----------+-------+  Summary:  Right: No evidence of thrombosis in the subclavian.  Left: No evidence of deep vein thrombosis in the upper extremity. Findings consistent with acute superficial vein thrombosis involving the left cephalic vein.  *See table(s) above for measurements and observations.    Preliminary       Assessment/Plan:  INTERVAL HISTORY:   He refused DC cardioversion and TEE   Principal Problem:   MRSA bacteremia Active Problems:   Septic joint ([place])   Atrial fibrillation with rapid ventricular response (HCC)   AKI (acute kidney injury) ([place])   Hyponatremia   Severe sepsis (HCC)   Severe sepsis with acute organ dysfunction (HCC)   Respiratory insufficiency   Acute pain of left knee   Prosthetic joint infection (HCC)    Tachypnea   Acute bilateral low back pain without sciatica   Left arm swelling   Red man syndrome    Vernon Frye is a 58 y.o. 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  #1        [facility] Staphylococcus aureus bacteremia   Staphylococcus aureus bacteremia (SAB) is associated with a high rate of complications and mortality.  Specific aspects of clinical management are critical to optimizing the outcome of patients with SAB.  Therefore, the [facility] Antimicrobial Management Team [facility]) has initiated an intervention aimed at improving the management of SAB at [facility].  To do so, Infectious Diseases physicians are providing an evidence-based consult for the management of all patients with SAB.     Yes No Comments  Perform follow-up blood cultures (even if the patient is afebrile) to ensure clearance of bacteremia [x]  []   I repeated blood cultures after his midline was removed and no growth at 5 days  Remove vascular catheter and obtain follow-up blood cultures after the removal of the catheter []  []   he has a new central line that was placed on Saturday placed  after bacteremia was cleared  Perform echocardiography to evaluate for endocarditis (transthoracic ECHO is 40-50% sensitive, TEE is > 90% sensitive) []  []   he is going to have a TEE maybe on Monday  Consult electrophysiologist to evaluate implanted cardiac device (pacemaker, ICD) []  []   Ensure source control []  []  Have all abscesses been drained effectively? Have deep seeded infections (septic joints or osteomyelitis) had appropriate surgical debridement?  Knee seems to have been the source  Investigate for "metastatic" sites of infection []  []  Does the patient have ANY symptom or physical exam finding that would suggest a deeper infection (back or  neck pain that may be suggestive of vertebral osteomyelitis or epidural abscess, muscle pain that could be a symptom of pyomyositis)?  Keep in mind that for deep seeded infections MRI imaging with contrast is preferred rather than other often insensitive tests such as plain x-rays, especially early in a patient's presentation.  MRI spine without infeciton  Change antibiotic therapy to vancomcyin  (challenge) []  []  Beta-lactam antibiotics are preferred for MSSA due to higher cure rates.   If on Vancomycin, goal trough should be 15 - 20 mcg/mL  Estimated duration of IV antibiotic therapy: 6 weeks followed by 5 to 6 months of oral antibiotics given prosthetic joint infection []  []  Consult case management for probably prolonged outpatient IV antibiotic therapy   #2 prosthetic joint infection: We will give him at least 6 months of therapy normally would add rifampin as an adjunct of drug but with his alcoholism never going to do that.  3.  Low back pain  MRI lumbar spine negative  4  Atrial fibrillation cardiology was considering DC cardioversion and now refusing  5 alleged vancomycin allergy this sounds more like "red man" syndrome    I will plan on giving him 6 weeks of anti-MRSA IV antibiotics  TEE will ONLY  Change management if he needed CVTS surgery and I very much doubt he would be a good operative candidate.  I will put in OPAT orders and outline antibiotic plan and arrange for followup in our clinic  Please    LOS: 8 days   Alcide Evener 07/21/2019, 2:11 PM

## 2019-07-21 NOTE — TOC Progression Note (Signed)
Transition of Care [facility]) - Progression Note    Patient Details  Name: Vernon Frye MRN: [id number] Date of Birth: 05/12/1961  [facility]) CM/SW Contact  Graves-Bigelow, Ocie Cornfield, RN Phone Number: 07/21/2019, 12:25 PM  Clinical Narrative:   Per Physical Therapy- patient is now agreeable to [place]. Patient is without insurance at this time. [facility] is closed today- As patient progresses- choice will need to be offered and clinicals submitted to facility once stable to transition out of hospital. FL2 has been initiated by previous Clinical Education officer, museum.   Barriers to Discharge: Continued Medical Work up    Social Determinants of Health (SDOH) Interventions    Readmission Risk Interventions No flowsheet data found.

## 2019-07-22 DIAGNOSIS — G9341 Metabolic encephalopathy: Secondary | ICD-10-CM

## 2019-07-22 DIAGNOSIS — M25562 Pain in left knee: Secondary | ICD-10-CM

## 2019-07-22 LAB — CBC
HCT: 32.3 % — ABNORMAL LOW (ref 39.0–52.0)
Hemoglobin: 10.5 g/dL — ABNORMAL LOW (ref 13.0–17.0)
MCH: 29.1 pg (ref 26.0–34.0)
MCHC: 32.5 g/dL (ref 30.0–36.0)
MCV: 89.5 fL (ref 80.0–100.0)
Platelets: 380 10*3/uL (ref 150–400)
RBC: 3.61 MIL/uL — ABNORMAL LOW (ref 4.22–5.81)
RDW: 15.4 % (ref 11.5–15.5)
WBC: 18.7 10*3/uL — ABNORMAL HIGH (ref 4.0–10.5)
nRBC: 0 % (ref 0.0–0.2)

## 2019-07-22 LAB — VANCOMYCIN, TROUGH: Vancomycin Tr: 11 ug/mL — ABNORMAL LOW (ref 15–20)

## 2019-07-22 MED ORDER — RIVAROXABAN 20 MG PO TABS: 20.0000 mg | ORAL_TABLET | Freq: Every day | ORAL | Status: DC

## 2019-07-22 MED ORDER — LISINOPRIL 10 MG PO TABS
10.0000 mg | ORAL_TABLET | Freq: Every day | ORAL | Status: DC
  Administered 2019-07-22 – 2019-07-27 (×5): 10 mg via ORAL
  Filled 2019-07-22 (×6): qty 1

## 2019-07-22 MED ORDER — METOPROLOL TARTRATE 5 MG/5ML IV SOLN
5.0000 mg | INTRAVENOUS | Status: DC | PRN
  Administered 2019-07-24 – 2019-07-27 (×7): 5 mg via INTRAVENOUS
  Filled 2019-07-22 (×7): qty 5

## 2019-07-22 MED ORDER — VANCOMYCIN HCL 1500 MG/300ML IV SOLN
1500.0000 mg | Freq: Two times a day (BID) | INTRAVENOUS | Status: DC
  Administered 2019-07-22 – 2019-07-25 (×6): 1500 mg via INTRAVENOUS
  Filled 2019-07-22 (×7): qty 300

## 2019-07-22 NOTE — Significant Event (Signed)
Rapid Response Event Note  Overview: Bleeding from Surgical Site and Extensive RLE Swelling  Initial Focused Assessment: I was called to bedside to assess patient for new onset bleeding at L Knee site coupled with extensive swelling in the LLE. The swelling was impressive, it was pitting into the LLE, I was able to palpate a pulse and we were able to doppler it. Sensation is intact, good/brisk capillary refill, and leg is warm. With over 25-30 minutes of pressure, bleeding stopped. TRH MD and ORTHO MD paged, [facility] MD came to bedside. HR low 100s, SBP 140-150s, 98% on RA, mild tachypnea. Patient is quite confused/agitated, this has been ongoing, he is expressing vivid visual hallucinations as well.   Interventions: -- Per ORTHO, pressure dressing with gauze and Koban was applied, then it was elevated above the level of the heart. Ice was applied as well.   Plan of Care: -- Monitor LLE and site for signs of worsening bleeding and/or swelling  -- Could this be a DVT, MD to place orders for w/o.  -- Monitor VS, treat agitation as ordered and as needed  -- Delirium Precautions   Event Summary:  Start Time 1637 End Time 1730  Suann Klier R

## 2019-07-22 NOTE — Progress Notes (Signed)
Called by [name] having significant bleeding from his knee post op.  He received Eliquis this am and was to transition to Xarelto this PM- I have held this dose.  MD to review in am.  Kerin Ransom PA-C 07/22/2019 5:01 PM

## 2019-07-22 NOTE — Progress Notes (Signed)
[facility]  PROGRESS NOTE  [name] [id number] DOB: 1960-10-26 DOA: 07/12/2019 PCP: Nicholes Rough, PA-C Admit date - 07/12/2019   Admitting Physician Rush Farmer, MD  Outpatient Primary MD for the patient is Nicholes Rough, PA-C  LOS - 9 Brief Narrative   Vernon Frye is a 58 y.o. year old male with medical history significant for Afib not taking prescribed Eliquis is unable to afford and only on aspirin, anxiety, arthritis, hypertension, left total knee arthroplasty in 2016 who presented on 07/12/2019 with left knee pain and swelling x1 day with inability to bear weight or bend knee and was found to have A. fib with RVR with initial lactic acid of 4.2, and WBC of 16 consistent with sepsis presume related to right knee and required Cardizem infusion for rate control.  Underwent left knee aspiration on 12/16 with 120 cc of purulent synovial fluid, was taken to the OR on 12/16 for septic left total knee replacement by [name].  Monitored in ICU after operation, status post extubation, course complicated by toxic metabolic encephalopathy requiring Precedex infusion in the setting of MRSA bacteremia secondary to septic right knee joint  Hospital course complicated by concern for vancomycin tolerance.  Vanco started in the ED during volume resuscitation patient became erythematous with blanching facial rash extending from neck to clavicle prompting discontinuation of bank and supportive treatment with Benadryl, Pepcid.  Patient became tachypneic and desaturated to 80% start on 3 L nasal cannula for which PCCM was consulted on admission  Subjective  Became more agitated and restless overnight. Has needed haldol and ativan to help calm down  A & P   MRSA bacteremia secondary to septic right knee joint, status post washout and antibiotic spacer placement Initially on daptomycin due to concern for allergic drug reaction after vancomycin initiation, currently tolerating vancomycin TTE  negative for vegetations.  Underwent emergent source control with operative debridement on 12/16 by orthopedics.  Sepsis physiology resolved.  Repeat blood cultures (12/18) unremarkable MRI lumbar spine negative -Plan for TEE by cardiology on 12/28; (of note patient declined initial TEE) per cardiology -Tolerating IV vancomycin--per pharmacy, per ID, to continue both 6 weeks course per ID, OPAT orders in place  Postoperative Atrial fibrillation with RVR Agitation has made RVR difficult to control Nonadherent with Eliquis as outpatient Declining TEE/cardioversion on 12/24 Declined Eliquis ernight -Appreciate cardiology recs: Cardizem to 90 mg every 6 hours for now, continue Toprol-XL 200 mg daily, IV metoprolol PRN HR > 130 -Cardiology plans for TEE/cardioversion, hopefully on Friday pending discussion with family -Continue Lovenox -Switched to xarelto to improve adherence given only daily dosing  Acute toxic metabolic encephalopathy, secondary to agitated delirium, waxing and waning.   Presume in the setting of sepsis as well as high concern for polysubstance abuse/alcohol abuse/withdrawal Previous required Precedex infusion in ICU Was alert and oriented x4  On 12/24 but worsening agitation overnight with need for haldol, ativan, and frequent redirection on 12/25 -continue supplementation with thiamine, folate, multivitamin -Continue scheduled Seroquel --Delirium precautions, moved closer to nursing station -As needed Haldol, Ativan for agitation  Left upper extremity swelling related to IV line site -Venous duplex negative for DVT  Acute respiratory insufficiency with hypoxia, resolved Intubated during operative debridement of knee next abated without difficulty. 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  Now back to baseline 0.93 -Avoid nephrotoxins, monitor BMP, monitor output   Family Communication  : None at bedside  Code Status : Full  Disposition Plan  : TEE  most likely 12/28, continue IV vancomycin, close monitoring of agitation, close monitoring of RVR  Consults  : PCCM, cardiology, ID, orthopedics  Procedures  :  12/16 septic left total knee replacement TTE, 12/19 55-60% Venous DVT, 12/16  Bilateral lower extremities, negative for DVT  DVT Prophylaxis  :  Lovenox  Lab Results  Component Value Date   PLT 380 07/22/2019    Diet :  Diet Order            DIET DYS 3 Room service appropriate? No; Fluid consistency: Thin  Diet effective now               Inpatient Medications Scheduled Meds: . diltiazem  90 mg Oral Q6H  . docusate sodium  100 mg Oral BID  . folic acid  1 mg Oral Daily  . lisinopril  10 mg Oral Daily  . metoprolol succinate  200 mg Oral Daily  . multivitamin with minerals  1 tablet Oral Daily  . QUEtiapine  25 mg Oral BID  . rivaroxaban  20 mg Oral Daily  . thiamine  100 mg Oral Daily   Or  . thiamine  100 mg Intravenous Daily   Continuous Infusions: . sodium chloride 50 mL/hr at 07/21/19 0134  . sodium chloride Stopped (07/22/19 0158)  . vancomycin 1,500 mg (07/22/19 1211)   PRN Meds:.diphenhydrAMINE, haloperidol lactate, LORazepam, menthol-cetylpyridinium **OR** phenol, metoprolol tartrate, ondansetron **OR** ondansetron (ZOFRAN) IV, oxyCODONE, Resource ThickenUp Clear, sodium chloride flush  Antibiotics  :    Anti-infectives (From admission, onward)   Start     Dose/Rate Route Frequency Ordered Stop   07/22/19 1000  vancomycin (VANCOREADY) IVPB 1500 mg/300 mL     1,500 mg 75 mL/hr over 240 Minutes Intravenous Every 12 hours 07/22/19 0237     07/19/19 1300  vancomycin (VANCOCIN) IVPB 1000 mg/200 mL premix  Status:  Discontinued     1,000 mg 66.7 mL/hr over 180 Minutes Intravenous Every 12 hours 07/19/19 1145 07/22/19 0236   07/14/19 2000  DAPTOmycin (CUBICIN) 1,000 mg in sodium chloride 0.9 % IVPB  Status:  Discontinued     1,000 mg 240 mL/hr over 30 Minutes Intravenous Daily 07/14/19 0807 07/19/19 1145   07/13/19 2119  vancomycin (VANCOCIN) powder  Status:  Discontinued       As needed 07/13/19 2119 07/13/19 2154   07/13/19 1400  DAPTOmycin (CUBICIN) 944 mg in sodium chloride 0.9 % IVPB  Status:  Discontinued     8 mg/kg  118 kg 237.8 mL/hr over 30 Minutes Intravenous Daily 07/13/19 1320 07/14/19 0807   07/13/19 1015  piperacillin-tazobactam (ZOSYN) IVPB 3.375 g  Status:  Discontinued     3.375 g 12.5 mL/hr over 240 Minutes Intravenous Every 8 hours 07/13/19 1002 07/13/19 1535   07/13/19 0400  cefTRIAXone (ROCEPHIN) 2 g in sodium chloride 0.9 % 100 mL IVPB  Status:  Discontinued     2 g 200 mL/hr over 30 Minutes Intravenous Every 24 hours 07/13/19 0337 07/13/19 0951   07/13/19 0400  vancomycin (VANCOCIN) 2,000 mg in sodium chloride 0.9 % 500 mL IVPB     2,000 mg 250 mL/hr over 120 Minutes Intravenous  Once 07/13/19 0339 07/13/19 0707   07/13/19 0339  vancomycin variable dose per unstable renal function (pharmacist dosing)  Status:  Discontinued      Does not apply See admin instructions 07/13/19 0340 07/13/19 0911       Objective   Vitals:   07/21/19 1956 07/22/19 0054 07/22/19 0737 07/22/19 0858  BP: (!) 164/95 (!) 163/98 (!) 156/102 (!) 137/97  Pulse:    (!) 138  Resp: (!) 39 (!) 37    Temp:      TempSrc:      SpO2:      Weight:      Height:        SpO2: 96 % O2 Flow  Rate (L/min): 0.5 L/min FiO2 (%): 30 %  Wt Readings from Last 3 Encounters:  07/21/19 111.9 kg  09/16/18 117.9 kg  06/19/15 114 kg     Intake/Output Summary (Last 24 hours) at 07/22/2019 1527 Last data filed at 07/22/2019 0400 Gross per 24 hour  Intake 597.08 ml  Output 1900 ml  Net -1302.92 ml    Physical Exam:  Awake, alert only to self, easily distractible No new F.N deficits,  Symmetrical Chest wall movement, Good air movement bilaterally on room air, CTAB Irregularly irregular rhythm, mildly tachcycardic +ve B.Sounds, Abd Soft, No tenderness, No organomegaly appreciated, No rebound, guarding or rigidity. Left knee wrapped in bandage with a bit of drainage on dressing on lateral side    I have personally reviewed the following:   Data Reviewed:  CBC Recent Labs  Lab 07/17/19 0503 07/18/19 0233 07/20/19 0222 07/21/19 0622 07/22/19 0106  WBC 21.2* 19.1* 21.1* 17.1* 18.7*  HGB 12.1* 11.1* 11.1* 10.9* 10.5*  HCT 37.0* 32.6* 33.4* 33.7* 32.3*  PLT 192 176 311 293 380  MCV 88.9 85.8 88.1 89.4 89.5  MCH 29.1 29.2 29.3 28.9 29.1  MCHC 32.7 34.0 33.2 32.3 32.5  RDW 14.9 14.6 15.1 15.4 15.4  LYMPHSABS  --   --  2.0  --   --   MONOABS  --   --  0.9  --   --   EOSABS  --   --  0.0  --   --   BASOSABS  --   --  0.1  --   --     Chemistries  Recent Labs  Lab 07/17/19 1539 07/18/19 0233 07/19/19 0527 07/20/19 0222 07/21/19 0350  NA 142 143 142 140 138  K 3.5 3.9 3.9 3.9 4.3  CL 110 109 109 105 107  CO2 21* 22 20* 20* 18*  GLUCOSE 186* 167* 156* 169* 183*  BUN 46* 38* 31* 22* 20  CREATININE 1.25* 1.00 1.09 0.93 0.82  CALCIUM 7.8* 7.9* 7.7* 7.6* 7.3*   ------------------------------------------------------------------------------------------------------------------ No results for input(s): CHOL, HDL, LDLCALC, TRIG, CHOLHDL, LDLDIRECT in the last 72 hours.  Lab Results  Component Value Date   HGBA1C 6.3 (H) 07/13/2019    ------------------------------------------------------------------------------------------------------------------ No results for input(s): TSH, T4TOTAL, T3FREE, THYROIDAB in the last 72 hours.  Invalid input(s): FREET3 ------------------------------------------------------------------------------------------------------------------ No results for input(s): VITAMINB12, FOLATE, FERRITIN, TIBC, IRON, RETICCTPCT in the last 72 hours.  Coagulation profile No results for input(s): INR, PROTIME in the last 168 hours.  No results for input(s): DDIMER in the last 72 hours.  Cardiac Enzymes No results for input(s): CKMB, TROPONINI, MYOGLOBIN in the last 168 hours.  Invalid input(s): CK ------------------------------------------------------------------------------------------------------------------    Component Value Date/Time   BNP 299.7 (H) 07/13/2019 0431    Micro Results Recent Results (from the past 240 hour(s))  Blood Culture (routine x 2)     Status: Abnormal   Collection Time: 07/13/19  1:10 AM   Specimen: BLOOD  Result Value Ref Range Status   Specimen Description BLOOD RIGHT ANTECUBITAL  Final   Special Requests   Final    BOTTLES DRAWN AEROBIC AND ANAEROBIC Blood Culture adequate volume   Culture  Setup Time   Final    GRAM POSITIVE COCCI IN CLUSTERS IN BOTH AEROBIC AND ANAEROBIC BOTTLES CRITICAL RESULT CALLED TO, READ BACK BY AND VERIFIED WITH: Salome Holmes PHARMD 1510 07/13/19 A BROWNING    Culture (A)  Final    STAPHYLOCOCCUS AUREUS SUSCEPTIBILITIES PERFORMED ON PREVIOUS CULTURE WITHIN THE LAST 5 DAYS. Performed at [facility], [place] [address] [place], [place] [address]    Report Status 07/15/2019 FINAL  Final  Blood Culture (routine x 2)     Status: Abnormal   Collection Time: 07/13/19  1:14 AM   Specimen: BLOOD  Result Value Ref Range Status   Specimen Description BLOOD LEFT ANTECUBITAL  Final   Special Requests   Final    BOTTLES DRAWN AEROBIC AND  ANAEROBIC Blood Culture adequate volume   Culture  Setup Time   Final    GRAM POSITIVE COCCI IN CLUSTERS IN BOTH AEROBIC AND ANAEROBIC BOTTLES CRITICAL RESULT CALLED TO, READ BACK BY AND VERIFIED WITH: Salome Holmes [facility] [id number] 07/13/19 A BROWNING Performed at [facility], [place] [address], [place], [place] [address]    Culture METHICILLIN RESISTANT STAPHYLOCOCCUS AUREUS (A)  Final   Report Status 07/15/2019 FINAL  Final   Organism ID, Bacteria METHICILLIN RESISTANT STAPHYLOCOCCUS AUREUS  Final      Susceptibility   Methicillin resistant staphylococcus aureus - MIC*    CIPROFLOXACIN >=8 RESISTANT Resistant     ERYTHROMYCIN >=8 RESISTANT Resistant     GENTAMICIN <=0.5 SENSITIVE Sensitive     OXACILLIN >=4 RESISTANT Resistant     TETRACYCLINE <=1 SENSITIVE Sensitive     VANCOMYCIN <=0.5 SENSITIVE Sensitive     TRIMETH/SULFA <=10 SENSITIVE Sensitive     CLINDAMYCIN <=0.25 SENSITIVE Sensitive     RIFAMPIN <=0.5 SENSITIVE Sensitive     Inducible Clindamycin NEGATIVE Sensitive     * METHICILLIN RESISTANT STAPHYLOCOCCUS AUREUS  Blood Culture ID Panel (Reflexed)     Status: Abnormal   Collection Time: 07/13/19  1:14 AM  Result Value Ref Range Status   Enterococcus species NOT DETECTED NOT DETECTED Final   Listeria monocytogenes NOT DETECTED NOT DETECTED Final   Staphylococcus species DETECTED (A) NOT DETECTED Final    Comment: CRITICAL RESULT CALLED TO, READ BACK BY AND VERIFIED WITH: Salome Holmes PHARMD 1510 07/13/19 A BROWNING    Staphylococcus aureus (BCID) DETECTED (A) NOT DETECTED Final    Comment: Methicillin (oxacillin)-resistant Staphylococcus aureus (MRSA). MRSA is predictably resistant to beta-lactam antibiotics (except ceftaroline). Preferred therapy is vancomycin unless clinically contraindicated. Patient requires contact precautions if  hospitalized. CRITICAL RESULT CALLED TO, READ BACK BY AND VERIFIED WITH: Salome Holmes PHARMD [id number] 07/13/19 A BROWNING    Methicillin resistance  DETECTED (A) NOT DETECTED Final    Comment: CRITICAL RESULT CALLED TO, READ BACK BY AND VERIFIED WITH: Salome Holmes PHARMD [id number] 07/13/19 A BROWNING    Streptococcus species NOT DETECTED NOT DETECTED Final   Streptococcus agalactiae NOT DETECTED NOT DETECTED Final  Streptococcus pneumoniae NOT DETECTED NOT DETECTED Final   Streptococcus pyogenes NOT DETECTED NOT DETECTED Final   Acinetobacter baumannii NOT DETECTED NOT DETECTED Final   Enterobacteriaceae species NOT DETECTED NOT DETECTED Final   Enterobacter cloacae complex NOT DETECTED NOT DETECTED Final   Escherichia coli NOT DETECTED NOT DETECTED Final   Klebsiella oxytoca NOT DETECTED NOT DETECTED Final   Klebsiella pneumoniae NOT DETECTED NOT DETECTED Final   Proteus species NOT DETECTED NOT DETECTED Final   Serratia marcescens NOT DETECTED NOT DETECTED Final   Haemophilus influenzae NOT DETECTED NOT DETECTED Final   Neisseria meningitidis NOT DETECTED NOT DETECTED Final   Pseudomonas aeruginosa NOT DETECTED NOT DETECTED Final   Candida albicans NOT DETECTED NOT DETECTED Final   Candida glabrata NOT DETECTED NOT DETECTED Final   Candida krusei NOT DETECTED NOT DETECTED Final   Candida parapsilosis NOT DETECTED NOT DETECTED Final   Candida tropicalis NOT DETECTED NOT DETECTED Final    Comment: Performed at [facility], [place] [address] [place], [place] [address]  SARS CORONAVIRUS 2 (TAT 6-24 HRS) Nasopharyngeal Nasopharyngeal Swab     Status: None   Collection Time: 07/13/19  2:57 AM   Specimen: Nasopharyngeal Swab  Result Value Ref Range Status   SARS Coronavirus 2 NEGATIVE NEGATIVE Final    Comment: (NOTE) SARS-CoV-2 target nucleic acids are NOT DETECTED. The SARS-CoV-2 RNA is generally detectable in upper and lower respiratory specimens during the acute phase of infection. Negative results do not preclude SARS-CoV-2 infection, do not rule out co-infections with other pathogens, and should not be used as the sole basis  for treatment or other patient management decisions. Negative results must be combined with clinical observations, patient history, and epidemiological information. The expected result is Negative. Fact Sheet for Patients: SugarRoll.be Fact Sheet for Healthcare Providers: https://www.woods-mathews.com/ This test is not yet approved or cleared by the [place] FDA and  has been authorized for detection and/or diagnosis of SARS-CoV-2 by FDA under an Emergency Use Authorization (EUA). This EUA will remain  in effect (meaning this test can be used) for the duration of the COVID-19 declaration under Section 56 4(b)(1) of the Act, 21 U.S.C. section 360bbb-3(b)(1), unless the authorization is terminated or revoked sooner. Performed at [facility], [place] [address] [place], [place] [address]   Respiratory Panel by RT PCR (Flu A&B, Covid) - Nasopharyngeal Swab     Status: None   Collection Time: 07/13/19  6:56 AM   Specimen: Nasopharyngeal Swab  Result Value Ref Range Status   SARS Coronavirus 2 by RT PCR NEGATIVE NEGATIVE Final    Comment: (NOTE) SARS-CoV-2 target nucleic acids are NOT DETECTED. The SARS-CoV-2 RNA is generally detectable in upper respiratoy specimens during the acute phase of infection. The lowest concentration of SARS-CoV-2 viral copies this assay can detect is 131 copies/mL. A negative result does not preclude SARS-Cov-2 infection and should not be used as the sole basis for treatment or other patient management decisions. A negative result may occur with  improper specimen collection/handling, submission of specimen other than nasopharyngeal swab, presence of viral mutation(s) within the areas targeted by this assay, and inadequate number of viral copies (<131 copies/mL). A negative result must be combined with clinical observations, patient history, and epidemiological information. The expected result is  Negative. Fact Sheet for Patients:  PinkCheek.be Fact Sheet for Healthcare Providers:  GravelBags.it This test is not yet ap proved or cleared by the [place] and  has been authorized for  detection and/or diagnosis of SARS-CoV-2 by FDA under an Emergency Use Authorization (EUA). This EUA will remain  in effect (meaning this test can be used) for the duration of the COVID-19 declaration under Section 564(b)(1) of the Act, 21 U.S.C. section 360bbb-3(b)(1), unless the authorization is terminated or revoked sooner.    Influenza A by PCR NEGATIVE NEGATIVE Final   Influenza B by PCR NEGATIVE NEGATIVE Final    Comment: (NOTE) The Xpert Xpress SARS-CoV-2/FLU/RSV assay is intended as an aid in  the diagnosis of influenza from Nasopharyngeal swab specimens and  should not be used as a sole basis for treatment. Nasal washings and  aspirates are unacceptable for Xpert Xpress SARS-CoV-2/FLU/RSV  testing. Fact Sheet for Patients: PinkCheek.be Fact Sheet for Healthcare Providers: GravelBags.it This test is not yet approved or cleared by the [place] FDA and  has been authorized for detection and/or diagnosis of SARS-CoV-2 by  FDA under an Emergency Use Authorization (EUA). This EUA will remain  in effect (meaning this test can be used) for the duration of the  Covid-19 declaration under Section 564(b)(1) of the Act, 21  U.S.C. section 360bbb-3(b)(1), unless the authorization is  terminated or revoked. Performed at [facility], [place] [address], [place], [place] [address]   Urine culture     Status: None   Collection Time: 07/13/19  8:18 AM   Specimen: In/Out Cath Urine  Result Value Ref Range Status   Specimen Description IN/OUT CATH URINE  Final   Special Requests NONE  Final   Culture   Final    NO GROWTH Performed at [facility], [place] [address] [place], [place] [address]    Report Status 07/14/2019 FINAL  Final  MRSA PCR Screening     Status: Abnormal   Collection Time: 07/13/19 10:59 AM   Specimen: Nasal Mucosa; Nasopharyngeal  Result Value Ref Range Status   MRSA by PCR POSITIVE (A) NEGATIVE Final    Comment:        The GeneXpert MRSA Assay (FDA approved for NASAL specimens only), is one component of a comprehensive MRSA colonization surveillance program. It is not intended to diagnose MRSA infection nor to guide or monitor treatment for MRSA infections. RESULT CALLED TO, READ BACK BY AND VERIFIED WITH: B. Adonis Brook RN 13:15 07/13/19 ([email or web address]) Performed at [facility], [place] [address], [place], [place] [address]   Body fluid culture     Status: None   Collection Time: 07/13/19  1:29 PM   Specimen: Synovium; Body Fluid  Result Value Ref Range Status   Specimen Description SYNOVIAL KNEE LEFT  Final   Special Requests NONE  Final   Gram Stain   Final    ABUNDANT WBC PRESENT, PREDOMINANTLY MONONUCLEAR ABUNDANT GRAM POSITIVE COCCI Performed at [facility], [place] [address] [place], [place] [address]    Culture   Final    ABUNDANT METHICILLIN RESISTANT STAPHYLOCOCCUS AUREUS   Report Status 07/15/2019 FINAL  Final   Organism ID, Bacteria METHICILLIN RESISTANT STAPHYLOCOCCUS AUREUS  Final      Susceptibility   Methicillin resistant staphylococcus aureus - MIC*    CIPROFLOXACIN >=8 RESISTANT Resistant     ERYTHROMYCIN >=8 RESISTANT Resistant     GENTAMICIN <=0.5 SENSITIVE Sensitive     OXACILLIN >=4 RESISTANT Resistant     TETRACYCLINE <=1 SENSITIVE Sensitive     VANCOMYCIN <=0.5 SENSITIVE Sensitive     TRIMETH/SULFA <=10 SENSITIVE Sensitive     CLINDAMYCIN <=0.25 SENSITIVE  Sensitive     RIFAMPIN <=0.5 SENSITIVE Sensitive     Inducible Clindamycin NEGATIVE Sensitive     * ABUNDANT METHICILLIN RESISTANT STAPHYLOCOCCUS AUREUS  Group A Strep by PCR     Status: None   Collection Time: 07/13/19  4:08 PM    Specimen: Throat; Sterile Swab  Result Value Ref Range Status   Group A Strep by PCR NOT DETECTED NOT DETECTED Final    Comment: Performed at [facility], [place] [address] [place], [place] [address]  Culture, blood (routine x 2)     Status: Abnormal   Collection Time: 07/13/19  5:14 PM   Specimen: BLOOD RIGHT HAND  Result Value Ref Range Status   Specimen Description BLOOD RIGHT HAND  Final   Special Requests   Final    BOTTLES DRAWN AEROBIC ONLY Blood Culture adequate volume   Culture  Setup Time   Final    GRAM POSITIVE COCCI AEROBIC BOTTLE ONLY CRITICAL VALUE NOTED.  VALUE IS CONSISTENT WITH PREVIOUSLY REPORTED AND CALLED VALUE.    Culture (A)  Final    STAPHYLOCOCCUS AUREUS SUSCEPTIBILITIES PERFORMED ON PREVIOUS CULTURE WITHIN THE LAST 5 DAYS. Performed at [facility], [place] [address], [place], [place] [address]    Report Status 07/17/2019 FINAL  Final  Culture, blood (routine x 2)     Status: Abnormal   Collection Time: 07/13/19  5:14 PM   Specimen: BLOOD RIGHT HAND  Result Value Ref Range Status   Specimen Description BLOOD RIGHT HAND  Final   Special Requests   Final    BOTTLES DRAWN AEROBIC ONLY Blood Culture adequate volume   Culture  Setup Time   Final    GRAM POSITIVE COCCI AEROBIC BOTTLE ONLY CRITICAL VALUE NOTED.  VALUE IS CONSISTENT WITH PREVIOUSLY REPORTED AND CALLED VALUE.    Culture (A)  Final    STAPHYLOCOCCUS AUREUS SUSCEPTIBILITIES PERFORMED ON PREVIOUS CULTURE WITHIN THE LAST 5 DAYS. Performed at [facility], [place] [address], [place], [place] [address]    Report Status 07/18/2019 FINAL  Final  Culture, blood (routine x 2)     Status: Abnormal   Collection Time: 07/14/19  9:50 AM   Specimen: BLOOD  Result Value Ref Range Status   Specimen Description BLOOD RIGHT ANTECUBITAL  Final   Special Requests   Final    BOTTLES DRAWN AEROBIC ONLY Blood Culture adequate volume   Culture  Setup Time   Final    GRAM POSITIVE COCCI AEROBIC  BOTTLE ONLY CRITICAL VALUE NOTED.  VALUE IS CONSISTENT WITH PREVIOUSLY REPORTED AND CALLED VALUE.    Culture (A)  Final    STAPHYLOCOCCUS AUREUS SUSCEPTIBILITIES PERFORMED ON PREVIOUS CULTURE WITHIN THE LAST 5 DAYS. Performed at [facility], [place] [address], [place],  [address]    Report Status 07/17/2019 FINAL  Final  Culture, blood (routine x 2)     Status: None   Collection Time: 07/14/19  9:56 AM   Specimen: BLOOD RIGHT HAND  Result Value Ref Range Status   Specimen Description BLOOD RIGHT HAND  Final   Special Requests   Final    BOTTLES DRAWN AEROBIC ONLY Blood Culture results may not be optimal due to an inadequate volume of blood received in culture bottles   Culture   Final    NO GROWTH 5 DAYS Performed at [facility], [place] [address], [place],  [address]    Report Status 07/19/2019 FINAL  Final  Culture,  blood (routine x 2)     Status: None   Collection Time: 07/15/19 12:21 PM   Specimen: BLOOD RIGHT HAND  Result Value Ref Range Status   Specimen Description BLOOD RIGHT HAND  Final   Special Requests   Final    BOTTLES DRAWN AEROBIC ONLY Blood Culture results may not be optimal due to an inadequate volume of blood received in culture bottles   Culture   Final    NO GROWTH 5 DAYS Performed at [facility], [place] [address] [place], [place] [address]    Report Status 07/20/2019 FINAL  Final  Culture, blood (routine x 2)     Status: None   Collection Time: 07/15/19  1:20 PM   Specimen: BLOOD RIGHT ARM  Result Value Ref Range Status   Specimen Description BLOOD RIGHT ARM  Final   Special Requests   Final    BOTTLES DRAWN AEROBIC ONLY Blood Culture adequate volume   Culture   Final    NO GROWTH 5 DAYS Performed at [facility], [place] [address], [place], [place] [address]    Report Status 07/20/2019 FINAL  Final    Radiology Reports MR Lumbar Spine W Wo Contrast  Result Date: 07/18/2019 CLINICAL DATA:  Back pain. Sepsis. EXAM:  MRI LUMBAR SPINE WITHOUT AND WITH CONTRAST TECHNIQUE: Multiplanar and multiecho pulse sequences of the lumbar spine were obtained without and with intravenous contrast. CONTRAST:  40mL GADAVIST GADOBUTROL 1 MMOL/ML IV SOLN COMPARISON:  None. FINDINGS: Segmentation:  Standard. Alignment:  Physiologic. Vertebrae: There is diffuse mottled low signal throughout the bones of the spine and sacrum. The patient is anemic and this may represent reactivated red marrow. Does the patient have any other hematologic disorders? No fractures or bone destruction. There is a small amount of fluid in the L4-5 and L5-S1 disc spaces. No findings suggestive of osteomyelitis. Conus medullaris and cauda equina: Conus extends to the T12-L1 level. Conus and cauda equina appear normal. Paraspinal and other soft tissues: Negative. Disc levels: L1-2: Normal disc. Slight hypertrophy of the facet joints. L2-3: Tiny central disc bulge with no neural impingement. Slight hypertrophy of the facet joints and ligamentum flavum without neural impingement. L3-4: Tiny central disc bulge with an annular fissure. No neural impingement. Facet joints are normal. L4-5: Chronic degenerative changes of the vertebral endplates. Tiny amount of fluid in the disc space. I doubt that this represents discitis. No significant enhancement after contrast administration. There is a small broad-based disc bulge with accompanying osteophytes extending into the right neural foramen without neural impingement. Slight hypertrophy of the right ligamentum flavum. Facet joints are otherwise normal. L5-S1: Tiny amount of fluid in the left anterolateral aspect of the disc space, likely degenerative in origin. No appreciable enhancement after contrast administration. Tiny broad-based disc bulge without neural impingement. No foraminal stenosis. No facet arthritis. IMPRESSION: 1. No discrete osteomyelitis or discitis in the lumbar spine. Small amounts of fluid in the L4-5 and L5-S1  disc spaces are likely degenerative in origin. 2. Multilevel degenerative disc disease without neural impingement. 3. Diffuse mottled low signal throughout the bones of the spine and sacrum which may represent reactivated red marrow. The patient is anemic. Electronically Signed   By: Lorriane Shire M.D.   On: 07/18/2019 18:34   US Renal  Result Date: 07/13/2019 CLINICAL DATA:  Acute renal injury EXAM: RENAL / URINARY TRACT ULTRASOUND COMPLETE COMPARISON:  None. FINDINGS: Right Kidney: Renal measurements: 14.7 x 6.1 x 6.6 cm. = volume:  310 mL . Echogenicity within normal limits. No mass or hydronephrosis visualized. Left Kidney: Renal measurements: 15.0 x 7.2 x 6.9 cm = volume: 385 mL. Mild hydronephrosis is noted with a prominent extrarenal pelvis. Bladder: Appears normal for degree of bladder distention. Other: None. IMPRESSION: Mild left-sided hydronephrosis is noted. Electronically Signed   By: Inez Catalina M.D.   On: 07/13/2019 10:40   DG CHEST PORT 1 VIEW  Result Date: 07/13/2019 CLINICAL DATA:  Shortness of breath EXAM: PORTABLE CHEST 1 VIEW COMPARISON:  07/13/2019 FINDINGS: The heart size is enlarged. There are scattered airspace opacities primarily at the lung bases which are new from prior study. There is a small left-sided pleural effusion. There is no pneumothorax. No acute osseous abnormality. IMPRESSION: 1. Low lung volumes with probable postoperative atelectasis at the lung bases. 2. Probable small left-sided pleural effusion. 3. Cardiomegaly. Electronically Signed   By: Constance Holster M.D.   On: 07/13/2019 23:52   DG Chest Port 1 View  Result Date: 07/13/2019 CLINICAL DATA:  Atrial fibrillation, hypoxia EXAM: PORTABLE CHEST 1 VIEW COMPARISON:  09/16/2018 FINDINGS: Lungs are clear. No pleural effusion or pneumothorax. Top-normal heart size. IMPRESSION: No acute process in the chest. Electronically Signed   By: Macy Mis M.D.   On: 07/13/2019 07:29   DG Knee Complete 4 Views  Left  Result Date: 07/12/2019 CLINICAL DATA:  Knee swelling and redness EXAM: LEFT KNEE - COMPLETE 4+ VIEW COMPARISON:  06/29/2015 FINDINGS: Prior left knee replacement, stable. Moderate joint effusion. No hardware complicating feature. No fracture, subluxation or dislocation. IMPRESSION: Prior left knee replacement. Moderate joint effusion. No acute bony abnormality. Electronically Signed   By: Rolm Baptise M.D.   On: 07/12/2019 19:30   ECHOCARDIOGRAM COMPLETE  Result Date: 07/16/2019   ECHOCARDIOGRAM REPORT   Patient Name:   Vernon Frye Date of Exam: 07/13/2019 Medical Rec #:  [id number]       Height:       75.0 in Accession #:    [id number]      Weight:       260.0 lb Date of Birth:  15-May-1961      BSA:          2.45 m Patient Age:    72 years        BP:           101/68 mmHg Patient Gender: M               HR:           124 bpm. Exam Location:  Inpatient Procedure: 2D Echo STAT ECHO Indications:     Atrial Fibrillation 427.31/ I48.91  History:         Patient has no prior history of Echocardiogram examinations.                  Risk Factors:Hypertension. Left knee sepitc joint, acute kidney                  injury, allergic reaction to antibiotic, COVID-19 test                  pending,.  Sonographer:     Darlina Sicilian RDCS Referring Phys:  [place] Diagnosing Phys: Eleonore Chiquito MD  Sonographer Comments: Patient condition. IMPRESSIONS  1. Left ventricular ejection fraction, by visual estimation, is 55 to 60%. The left ventricle has normal function. There is mildly increased left ventricular hypertrophy.  2. Left ventricular diastolic function  could not be evaluated.  3. Right ventricular volume/pressure overload.  4. The left ventricle demonstrates regional wall motion abnormalities.  5. Global right ventricle has severely reduced systolic function.The right ventricular size is severely enlarged. No increase in right ventricular wall thickness.  6. Left atrial size was normal.  7.  Right atrial size was mild-moderately dilated.  8. Presence of pericardial fat pad.  9. Trivial pericardial effusion is present. 10. Mild mitral annular calcification. 11. The mitral valve is degenerative. Trivial mitral valve regurgitation. 12. The tricuspid valve is grossly normal. Tricuspid valve regurgitation is trivial. 13. The aortic valve is tricuspid. Aortic valve regurgitation is not visualized. No evidence of aortic valve sclerosis or stenosis. 14. The pulmonic valve was grossly normal. Pulmonic valve regurgitation is not visualized. 15. TR signal is inadequate for assessing pulmonary artery systolic pressure. 16. The inferior vena cava is normal in size with greater than 50% respiratory variability, suggesting right atrial pressure of 3 mmHg. 17. No prior Echocardiogram. FINDINGS  Left Ventricle: Left ventricular ejection fraction, by visual estimation, is 55 to 60%. The left ventricle has normal function. The left ventricle demonstrates regional wall motion abnormalities. The left ventricular internal cavity size was the left ventricle is normal in size. There is mildly increased left ventricular hypertrophy. Abnormal (paradoxical) septal motion, consistent with right ventricular volume overload and/or elevated RV end-diastolic pressure. The left ventricular diastology could not be evaluated due to atrial fibrillation. Left ventricular diastolic function could not be evaluated. Right Ventricle: The right ventricular size is severely enlarged. No increase in right ventricular wall thickness. Global RV systolic function is has severely reduced systolic function. Left Atrium: Left atrial size was normal in size. Right Atrium: Right atrial size was mild-moderately dilated Pericardium: Trivial pericardial effusion is present. Presence of pericardial fat pad. Mitral Valve: The mitral valve is degenerative in appearance. Mild mitral annular calcification. Trivial mitral valve regurgitation. Tricuspid Valve: The  tricuspid valve is grossly normal. Tricuspid valve regurgitation is trivial. Aortic Valve: The aortic valve is tricuspid. Aortic valve regurgitation is not visualized. The aortic valve is structurally normal, with no evidence of sclerosis or stenosis. Pulmonic Valve: The pulmonic valve was grossly normal. Pulmonic valve regurgitation is not visualized. Pulmonic regurgitation is not visualized. Aorta: The aortic root and ascending aorta are structurally normal, with no evidence of dilitation. Venous: The inferior vena cava is normal in size with greater than 50% respiratory variability, suggesting right atrial pressure of 3 mmHg. IAS/Shunts: No atrial level shunt detected by color flow Doppler.  LEFT VENTRICLE PLAX 2D LVIDd:         4.20 cm LVIDs:         2.90 cm LV PW:         1.50 cm LV IVS:        1.40 cm LVOT diam:     1.90 cm LV SV:         46 ml LV SV Index:   18.34 LVOT Area:     2.84 cm  LEFT ATRIUM            Index LA diam:      4.00 cm  1.63 cm/m LA Vol (A4C): 104.0 ml 42.39 ml/m   AORTA Ao Root diam: 2.70 cm MITRAL VALVE MV Area (PHT): 5.34 cm            SHUNTS MV PHT:        41.18 msec          Systemic Diam: 1.90  cm MV Decel Time: 142 msec MV E velocity: 88.83 cm/s 103 cm/s  Eleonore Chiquito MD Electronically signed by Eleonore Chiquito MD Signature Date/Time: 07/13/2019/8:36:27 AM    Final (Updating)    VAS [place] LOWER EXTREMITY VENOUS (DVT)  Result Date: 07/13/2019  Lower Venous Study Indications: Edema.  Comparison Study: no prior Performing Technologist: Abram Sander RVS  Examination Guidelines: A complete evaluation includes B-mode imaging, spectral Doppler, color Doppler, and power Doppler as needed of all accessible portions of each vessel. Bilateral testing is considered an integral part of a complete examination. Limited examinations for reoccurring indications may be performed as noted.  +---------+---------------+---------+-----------+----------+--------------+ RIGHT     CompressibilityPhasicitySpontaneityPropertiesThrombus Aging +---------+---------------+---------+-----------+----------+--------------+ CFV      Full           Yes      Yes                                 +---------+---------------+---------+-----------+----------+--------------+ SFJ      Full                                                        +---------+---------------+---------+-----------+----------+--------------+ FV Prox  Full                                                        +---------+---------------+---------+-----------+----------+--------------+ FV Mid   Full                                                        +---------+---------------+---------+-----------+----------+--------------+ FV DistalFull                                                        +---------+---------------+---------+-----------+----------+--------------+ PFV      Full                                                        +---------+---------------+---------+-----------+----------+--------------+ POP      Full           Yes      Yes                                 +---------+---------------+---------+-----------+----------+--------------+ PTV      Full                                                        +---------+---------------+---------+-----------+----------+--------------+ PERO  Full                                                        +---------+---------------+---------+-----------+----------+--------------+   +---------+---------------+---------+-----------+----------+--------------+ LEFT     CompressibilityPhasicitySpontaneityPropertiesThrombus Aging +---------+---------------+---------+-----------+----------+--------------+ CFV      Full           Yes      Yes                                 +---------+---------------+---------+-----------+----------+--------------+ SFJ      Full                                                         +---------+---------------+---------+-----------+----------+--------------+ FV Prox  Full                                                        +---------+---------------+---------+-----------+----------+--------------+ FV Mid   Full                                                        +---------+---------------+---------+-----------+----------+--------------+ FV DistalFull                                                        +---------+---------------+---------+-----------+----------+--------------+ PFV      Full                                                        +---------+---------------+---------+-----------+----------+--------------+ POP      Full           Yes      Yes                                 +---------+---------------+---------+-----------+----------+--------------+ PTV      Full                                                        +---------+---------------+---------+-----------+----------+--------------+ PERO  Not visualized +---------+---------------+---------+-----------+----------+--------------+     Summary: Right: There is no evidence of deep vein thrombosis in the lower extremity. No cystic structure found in the popliteal fossa. Left: There is no evidence of deep vein thrombosis in the lower extremity. No cystic structure found in the popliteal fossa.  *See table(s) above for measurements and observations. Electronically signed by Monica Martinez MD on 07/13/2019 at 12:52:52 PM.    Final    VAS [place] UPPER EXTREMITY VENOUS DUPLEX  Result Date: 07/21/2019 UPPER VENOUS STUDY  Indications: Swelling Risk Factors: None identified. Limitations: Poor ultrasound/tissue interface and bandages. Comparison Study: No prior studies. Performing Technologist: Oliver Hum RVT  Examination Guidelines: A complete evaluation includes B-mode imaging, spectral Doppler, color  Doppler, and power Doppler as needed of all accessible portions of each vessel. Bilateral testing is considered an integral part of a complete examination. Limited examinations for reoccurring indications may be performed as noted.  Right Findings: +----------+------------+---------+-----------+----------+-------+ RIGHT     CompressiblePhasicitySpontaneousPropertiesSummary +----------+------------+---------+-----------+----------+-------+ Subclavian    Full       Yes       Yes                      +----------+------------+---------+-----------+----------+-------+  Left Findings: +----------+------------+---------+-----------+----------+-------+ LEFT      CompressiblePhasicitySpontaneousPropertiesSummary +----------+------------+---------+-----------+----------+-------+ IJV           Full       Yes       Yes                      +----------+------------+---------+-----------+----------+-------+ Subclavian    Full       Yes       Yes                      +----------+------------+---------+-----------+----------+-------+ Axillary      Full       Yes       Yes                      +----------+------------+---------+-----------+----------+-------+ Brachial      Full       Yes       Yes                      +----------+------------+---------+-----------+----------+-------+ Radial        Full                                          +----------+------------+---------+-----------+----------+-------+ Ulnar         Full                                          +----------+------------+---------+-----------+----------+-------+ Cephalic      None                                   Acute  +----------+------------+---------+-----------+----------+-------+ Basilic       Full                                          +----------+------------+---------+-----------+----------+-------+  Summary:  Right: No  evidence of thrombosis in the subclavian.  Left: No  evidence of deep vein thrombosis in the upper extremity. Findings consistent with acute superficial vein thrombosis involving the left cephalic vein.  *See table(s) above for measurements and observations.  Diagnosing physician: Monica Martinez MD Electronically signed by Monica Martinez MD on 07/21/2019 at 5:09:20 PM.    Final    [place] EKG SITE RITE  Result Date: 07/16/2019 If Site Rite image not attached, placement could not be confirmed due to current cardiac rhythm.    Time Spent in minutes  30     Desiree Hane M.D on 07/22/2019 at 3:27 PM  To page go to [email or web address] - password [facility]

## 2019-07-22 NOTE — Progress Notes (Signed)
Pharmacy Antibiotic Note  Vernon Frye is a 58 y.o. male admitted on 07/12/2019 with MRSA bacteremia and prosthetic joint infection.  Pharmacy has been consulted for vancomycin dosing.  Pt on extended infusion time to alleviate Red Man Syndrome; no adverse effects reported since this was begun.  Vanc peak 18, trough 11 >> AUC 356, below goal.  Plan: Change vancomycin to 1500mg  IV Q12H over 4hr. Goal AUC 400-550.  Expected AUC 530.  Planned therapy through 08/15/19.  Height: 6\' 3"  (190.5 cm) Weight: 246 lb 11.1 oz (111.9 kg) IBW/kg (Calculated) : 84.5  Temp (24hrs), Avg:99 F (37.2 C), Min:98.4 F (36.9 C), Max:99.6 F (37.6 C)  Recent Labs  Lab 07/17/19 0503 07/17/19 1539 07/18/19 0233 07/19/19 0527 07/20/19 0222 07/21/19 0350 07/21/19 0622 07/21/19 1834 07/22/19 0029 07/22/19 0106  WBC 21.2*  --  19.1*  --  21.1*  --  17.1*  --   --  18.7*  CREATININE  --  1.25* 1.00 1.09 0.93 0.82  --   --   --   --   VANCOTROUGH  --   --   --   --   --   --   --   --  11*  --   VANCOPEAK  --   --   --   --   --   --   --  18*  --   --     Estimated Creatinine Clearance: 132.6 mL/min (by C-G formula based on SCr of 0.82 mg/dL).    Allergies  Allergen Reactions  . Vancomycin Shortness Of Breath and Rash    [name]     Thank you for allowing pharmacy to be a part of this patient's care.  Wynona Neat, PharmD, BCPS  07/22/2019 2:37 AM

## 2019-07-22 NOTE — Plan of Care (Signed)
Pt alert and oriented to self only, frequent reorienting.

## 2019-07-22 NOTE — Progress Notes (Signed)
Progress Note  Patient Name: Vernon Frye Date of Encounter: 07/22/2019  Primary Cardiologist:   Sinclair Grooms, MD   Subjective   Very agitated and confused.  Atrial fib ventricular rate is up today.    Inpatient Medications    Scheduled Meds: . apixaban  5 mg Oral BID  . diltiazem  90 mg Oral Q6H  . docusate sodium  100 mg Oral BID  . folic acid  1 mg Oral Daily  . metoprolol succinate  200 mg Oral Daily  . multivitamin with minerals  1 tablet Oral Daily  . QUEtiapine  25 mg Oral BID  . thiamine  100 mg Oral Daily   Or  . thiamine  100 mg Intravenous Daily   Continuous Infusions: . sodium chloride 50 mL/hr at 07/21/19 0134  . sodium chloride Stopped (07/22/19 0158)  . vancomycin     PRN Meds: diphenhydrAMINE, haloperidol lactate, LORazepam, menthol-cetylpyridinium **OR** phenol, metoprolol tartrate, ondansetron **OR** ondansetron (ZOFRAN) IV, oxyCODONE, Resource ThickenUp Clear, sodium chloride flush   Vital Signs    Vitals:   07/21/19 1922 07/21/19 1956 07/22/19 0054 07/22/19 0737  BP:  (!) 164/95 (!) 163/98 (!) 156/102  Pulse: (!) 111     Resp:  (!) 39 (!) 37   Temp: 99.6 F (37.6 C)     TempSrc: Oral     SpO2:      Weight:      Height:        Intake/Output Summary (Last 24 hours) at 07/22/2019 0815 Last data filed at 07/22/2019 0400 Gross per 24 hour  Intake 797.08 ml  Output 1900 ml  Net -1102.92 ml   Filed Weights   07/19/19 0429 07/20/19 0447 07/21/19 0521  Weight: 113.3 kg 116.8 kg 111.9 kg    Telemetry    Atrial fib with RVR - Personally Reviewed  ECG    NA - Personally Reviewed  Physical Exam   GEN: No acute distress.   Neck: No  JVD Cardiac: Irregular RR, no murmurs, rubs, or gallops.  Respiratory: Clear  to auscultation bilaterally. GI: Soft, nontender, non-distended  MS: No  edema; No deformity. Neuro:  Nonfocal  Psych:   Very confused.   Labs    Chemistry Recent Labs  Lab 07/19/19 0527 07/20/19 0222  07/21/19 0350  NA 142 140 138  K 3.9 3.9 4.3  CL 109 105 107  CO2 20* 20* 18*  GLUCOSE 156* 169* 183*  BUN 31* 22* 20  CREATININE 1.09 0.93 0.82  CALCIUM 7.7* 7.6* 7.3*  GFRNONAA >60 >60 >60  GFRAA >60 >60 >60  ANIONGAP 13 15 13      Hematology Recent Labs  Lab 07/20/19 0222 07/21/19 0622 07/22/19 0106  WBC 21.1* 17.1* 18.7*  RBC 3.79* 3.77* 3.61*  HGB 11.1* 10.9* 10.5*  HCT 33.4* 33.7* 32.3*  MCV 88.1 89.4 89.5  MCH 29.3 28.9 29.1  MCHC 33.2 32.3 32.5  RDW 15.1 15.4 15.4  PLT 311 293 380    Cardiac EnzymesNo results for input(s): TROPONINI in the last 168 hours. No results for input(s): TROPIPOC in the last 168 hours.   BNPNo results for input(s): BNP, PROBNP in the last 168 hours.   DDimer No results for input(s): DDIMER in the last 168 hours.   Radiology    VAS [place] UPPER EXTREMITY VENOUS DUPLEX  Result Date: 07/21/2019 UPPER VENOUS STUDY  Indications: Swelling Risk Factors: None identified. Limitations: Poor ultrasound/tissue interface and bandages. Comparison Study: No prior studies. Performing  Technologist: Oliver Hum RVT  Examination Guidelines: A complete evaluation includes B-mode imaging, spectral Doppler, color Doppler, and power Doppler as needed of all accessible portions of each vessel. Bilateral testing is considered an integral part of a complete examination. Limited examinations for reoccurring indications may be performed as noted.  Right Findings: +----------+------------+---------+-----------+----------+-------+ RIGHT     CompressiblePhasicitySpontaneousPropertiesSummary +----------+------------+---------+-----------+----------+-------+ Subclavian    Full       Yes       Yes                      +----------+------------+---------+-----------+----------+-------+  Left Findings: +----------+------------+---------+-----------+----------+-------+ LEFT      CompressiblePhasicitySpontaneousPropertiesSummary  +----------+------------+---------+-----------+----------+-------+ IJV           Full       Yes       Yes                      +----------+------------+---------+-----------+----------+-------+ Subclavian    Full       Yes       Yes                      +----------+------------+---------+-----------+----------+-------+ Axillary      Full       Yes       Yes                      +----------+------------+---------+-----------+----------+-------+ Brachial      Full       Yes       Yes                      +----------+------------+---------+-----------+----------+-------+ Radial        Full                                          +----------+------------+---------+-----------+----------+-------+ Ulnar         Full                                          +----------+------------+---------+-----------+----------+-------+ Cephalic      None                                   Acute  +----------+------------+---------+-----------+----------+-------+ Basilic       Full                                          +----------+------------+---------+-----------+----------+-------+  Summary:  Right: No evidence of thrombosis in the subclavian.  Left: No evidence of deep vein thrombosis in the upper extremity. Findings consistent with acute superficial vein thrombosis involving the left cephalic vein.  *See table(s) above for measurements and observations.  Diagnosing physician: Monica Martinez MD Electronically signed by Monica Martinez MD on 07/21/2019 at 5:09:20 PM.    Final     Cardiac Studies   2D echo 06/2019 IMPRESSIONS   1. Left ventricular ejection fraction, by visual estimation, is 55 to 60%. The left ventricle has normal function. There is mildly increased left ventricular hypertrophy. 2. Left ventricular diastolic function could not be evaluated. 3.  Right ventricular volume/pressure overload. 4. The left ventricle demonstrates regional wall  motion abnormalities. 5. Global right ventricle has severely reduced systolic function.The right ventricular size is severely enlarged. No increase in right ventricular wall thickness. 6. Left atrial size was normal. 7. Right atrial size was mild-moderately dilated. 8. Presence of pericardial fat pad. 9. Trivial pericardial effusion is present. 10. Mild mitral annular calcification. 11. The mitral valve is degenerative. Trivial mitral valve regurgitation. 12. The tricuspid valve is grossly normal. Tricuspid valve regurgitation is trivial. 13. The aortic valve is tricuspid. Aortic valve regurgitation is not visualized. No evidence of aortic valve sclerosis or stenosis. 14. The pulmonic valve was grossly normal. Pulmonic valve regurgitation is not visualized. 15. TR signal is inadequate for assessing pulmonary artery systolic pressure. 16. The inferior vena cava is normal in size with greater than 50% respiratory variability, suggesting right atrial pressure of 3 mmHg. 17. No prior Echocardiogram.  Patient Profile     58 y.o. male with recent septic knee (prosthetic joint infection), MRSA bacteremia who is being followed for post-op afib with RVR  Assessment & Plan    ATRIAL FIB:   Declined TEE cardioversio.   Planned now for Monday 12/28.   On Eliquis.   However, he did not receive the PM dose.  Apparently refused.  He cannot get DCCV if he is not compliant with all of his doses of DOAC.  He is intermittently rapid rate despite Toprol and Diltiazem.   OK to use IV prn metoprolol if HR sustains greater than 130.  BP is elevated.  I will change to Xarelto so he only has to take one dose of DOAC daily.    HTN:  On lisinopril at home.  OK to resume.  BPs not controlled.  Restart lisinopril.    For questions or updates, please contact [place] Please consult [email or web address] for contact info under Cardiology/STEMI.   Signed, Minus Breeding, MD  07/22/2019, 8:15 AM

## 2019-07-22 NOTE — Care Plan (Signed)
Paged to bedside by RN. Oozing at surgical site despite persistent pressure.  Cardiology team advised to hold xarelto evening dose. Orthopedic team advised to elevate, ice, use coban dressing.  On my assessment he is oozing at surgical site with what seems to be appropriate post surgical swelling in knee.   I'm concerned about the distal swelling below his knee which is encompassing his entire leg to foot. About 2+ pitting edema. Pulses are palpable. Warm to touch. Able to move extremity. No concern for compartment syndrome. This degree of swelling doesn't seem related to his post surgical knee swelling. Though he is oozing I don't think this is bleeding given stable hgb and Blood pressure. DVT certainly in differential however patient has been anticoagulated initially with lovenox before transitioning to eliquis on 12/24 ( he declined evening dose on 12/24) and was planning to start xarelto this evening.  Will likely need venous duplex if doesn't resolve with elevation and dressing above Will continue to closely monitor and reassess in am.  Desiree Hane [facility]

## 2019-07-22 NOTE — Progress Notes (Signed)
Upon arrival in room pt noted with legs between rails and still very restless. Left knee noted with blood on ace wrap and blood on sheet. Pressure applied and swat nurse called. Swelling noted below left knee, pedal pulses faint palpable and audible with doppler. Attending, Ortho and Cards paged. Cards, [place], [place] stated to hold Xarelto tonight. Orthro, stated to hold pressure and apply pressure dressing with coban and attending is coming to evaluate.

## 2019-07-22 NOTE — Progress Notes (Signed)
Orthopedics Progress Note  Subjective: Patient confused this morning but cooperative.  Objective:  Vitals:   07/21/19 1956 07/22/19 0054  BP: (!) 164/95 (!) 163/98  Pulse:    Resp: (!) 39 (!) 37  Temp:    SpO2:      General: Awake and confused, in soft restraints Musculoskeletal: Left knee dressing reinforced and intact, compartments supple. Active ankle ROM with no pain Neurovascularly intact  Lab Results  Component Value Date   WBC 18.7 (H) 07/22/2019   HGB 10.5 (L) 07/22/2019   HCT 32.3 (L) 07/22/2019   MCV 89.5 07/22/2019   PLT 380 07/22/2019       Component Value Date/Time   NA 138 07/21/2019 0350   K 4.3 07/21/2019 0350   CL 107 07/21/2019 0350   CO2 18 (L) 07/21/2019 0350   GLUCOSE 183 (H) 07/21/2019 0350   BUN 20 07/21/2019 0350   CREATININE 0.82 07/21/2019 0350   CALCIUM 7.3 (L) 07/21/2019 0350   GFRNONAA >60 07/21/2019 0350   GFRAA >60 07/21/2019 0350    Lab Results  Component Value Date   INR 1.5 (H) 07/13/2019   INR 1.02 06/12/2015    Assessment/Plan: POD # 9 s/p Procedure(s): IRRIGATION AND DEBRIDEMENT TOTAL  KNEE WITH POLY EXCHANGE Patient still very ill with sepsis and s/p removal of infected TKR Supportive Care Antibiotic dosing per ID/pharmacy Will follow  Doran Heater. Veverly Fells, MD 07/22/2019 6:42 AM

## 2019-07-23 ENCOUNTER — Inpatient Hospital Stay (HOSPITAL_COMMUNITY): Payer: Self-pay

## 2019-07-23 ENCOUNTER — Encounter (HOSPITAL_COMMUNITY): Payer: Self-pay

## 2019-07-23 DIAGNOSIS — G9341 Metabolic encephalopathy: Secondary | ICD-10-CM

## 2019-07-23 LAB — COMPREHENSIVE METABOLIC PANEL
ALT: 96 U/L — ABNORMAL HIGH (ref 0–44)
AST: 69 U/L — ABNORMAL HIGH (ref 15–41)
Albumin: 2.1 g/dL — ABNORMAL LOW (ref 3.5–5.0)
Alkaline Phosphatase: 102 U/L (ref 38–126)
Anion gap: 9 (ref 5–15)
BUN: 13 mg/dL (ref 6–20)
CO2: 23 mmol/L (ref 22–32)
Calcium: 7.7 mg/dL — ABNORMAL LOW (ref 8.9–10.3)
Chloride: 107 mmol/L (ref 98–111)
Creatinine, Ser: 0.72 mg/dL (ref 0.61–1.24)
GFR calc Af Amer: >60 mL/min (ref >60)
GFR calc non Af Amer: >60 mL/min (ref >60)
Glucose, Bld: 152 mg/dL — ABNORMAL HIGH (ref 70–99)
Potassium: 3.2 mmol/L — ABNORMAL LOW (ref 3.5–5.1)
Sodium: 139 mmol/L (ref 135–145)
Total Bilirubin: 1.7 mg/dL — ABNORMAL HIGH (ref 0.3–1.2)
Total Protein: 6.3 g/dL — ABNORMAL LOW (ref 6.5–8.1)

## 2019-07-23 LAB — CBC
HCT: 26.4 % — ABNORMAL LOW (ref 39.0–52.0)
Hemoglobin: 8.5 g/dL — ABNORMAL LOW (ref 13.0–17.0)
MCH: 29.9 pg (ref 26.0–34.0)
MCHC: 32.2 g/dL (ref 30.0–36.0)
MCV: 93 fL (ref 80.0–100.0)
Platelets: UNDETERMINED 10*3/uL (ref 150–400)
RBC: 2.84 MIL/uL — ABNORMAL LOW (ref 4.22–5.81)
RDW: 15.2 % (ref 11.5–15.5)
WBC: 14.8 10*3/uL — ABNORMAL HIGH (ref 4.0–10.5)
nRBC: 0 % (ref 0.0–0.2)

## 2019-07-23 LAB — AMMONIA: Ammonia: 27 umol/L (ref 9–35)

## 2019-07-23 LAB — MAGNESIUM: Magnesium: 1.5 mg/dL — ABNORMAL LOW (ref 1.7–2.4)

## 2019-07-23 MED ORDER — POTASSIUM CHLORIDE CRYS ER 20 MEQ PO TBCR
40.0000 meq | EXTENDED_RELEASE_TABLET | ORAL | Status: AC
  Administered 2019-07-23 (×2): 40 meq via ORAL
  Filled 2019-07-23 (×2): qty 2

## 2019-07-23 MED ORDER — LORAZEPAM 2 MG/ML IJ SOLN
2.0000 mg | Freq: Once | INTRAMUSCULAR | Status: AC
  Administered 2019-07-23: 2 mg via INTRAMUSCULAR
  Filled 2019-07-23: qty 1

## 2019-07-23 MED ORDER — IOHEXOL 300 MG/ML  SOLN
100.0000 mL | Freq: Once | INTRAMUSCULAR | Status: AC | PRN
  Administered 2019-07-23: 100 mL via INTRAVENOUS

## 2019-07-23 NOTE — Progress Notes (Signed)
Progress Note  Patient Name: Vernon Frye Date of Encounter: 07/23/2019  Primary Cardiologist:   Sinclair Grooms, MD   Subjective   Patient continues to be agitated and confused.  Is receiving IM Ativan as his IVs have been pulled out and he is refusing further sticks.  Inpatient Medications    Scheduled Meds: . diltiazem  90 mg Oral Q6H  . docusate sodium  100 mg Oral BID  . folic acid  1 mg Oral Daily  . lisinopril  10 mg Oral Daily  . metoprolol succinate  200 mg Oral Daily  . multivitamin with minerals  1 tablet Oral Daily  . QUEtiapine  25 mg Oral BID  . thiamine  100 mg Oral Daily   Or  . thiamine  100 mg Intravenous Daily   Continuous Infusions: . sodium chloride 50 mL/hr at 07/21/19 0134  . sodium chloride Stopped (07/22/19 0158)  . vancomycin 1,500 mg (07/22/19 2153)   PRN Meds: diphenhydrAMINE, haloperidol lactate, iohexol, LORazepam, menthol-cetylpyridinium **OR** phenol, metoprolol tartrate, ondansetron **OR** ondansetron (ZOFRAN) IV, oxyCODONE, Resource ThickenUp Clear, sodium chloride flush   Vital Signs    Vitals:   07/22/19 2017 07/22/19 2358 07/23/19 0426 07/23/19 0810  BP: (!) 155/93 (!) 141/94 (!) 158/68 113/78  Pulse: (!) 104 (!) 111 (!) 114   Resp: 20 20 18    Temp: 99.9 F (37.7 C) 99.8 F (37.7 C) 99.1 F (37.3 C)   TempSrc: Oral Oral Oral   SpO2: 97% 98% 99%   Weight:   107 kg   Height:        Intake/Output Summary (Last 24 hours) at 07/23/2019 1052 Last data filed at 07/23/2019 0900 Gross per 24 hour  Intake 910 ml  Output 2200 ml  Net -1290 ml   Filed Weights   07/20/19 0447 07/21/19 0521 07/23/19 0426  Weight: 116.8 kg 111.9 kg 107 kg    Telemetry    Atrial fibrillation with rapid rates- Personally Reviewed  ECG    NA - Personally Reviewed  Physical Exam   GEN: Well nourished, well developed, in no acute distress  HEENT: normal  Neck: no JVD, carotid bruits, or masses Cardiac: Tachycardic, irregular; no  murmurs, rubs, or gallops,no edema  Respiratory:  clear to auscultation bilaterally, normal work of breathing GI: soft, nontender, nondistended, + BS MS: no deformity or atrophy  Skin: warm and dry Neuro:  Strength and sensation are intact Psych: Confused    Labs    Chemistry Recent Labs  Lab 07/19/19 0527 07/20/19 0222 07/21/19 0350  NA 142 140 138  K 3.9 3.9 4.3  CL 109 105 107  CO2 20* 20* 18*  GLUCOSE 156* 169* 183*  BUN 31* 22* 20  CREATININE 1.09 0.93 0.82  CALCIUM 7.7* 7.6* 7.3*  GFRNONAA >60 >60 >60  GFRAA >60 >60 >60  ANIONGAP 13 15 13      Hematology Recent Labs  Lab 07/21/19 0622 07/22/19 0106 07/23/19 0535  WBC 17.1* 18.7* 14.8*  RBC 3.77* 3.61* 2.84*  HGB 10.9* 10.5* 8.5*  HCT 33.7* 32.3* 26.4*  MCV 89.4 89.5 93.0  MCH 28.9 29.1 29.9  MCHC 32.3 32.5 32.2  RDW 15.4 15.4 15.2  PLT 293 380 PLATELET CLUMPS NOTED ON SMEAR, UNABLE TO ESTIMATE    Cardiac EnzymesNo results for input(s): TROPONINI in the last 168 hours. No results for input(s): TROPIPOC in the last 168 hours.   BNPNo results for input(s): BNP, PROBNP in the last 168 hours.  DDimer No results for input(s): DDIMER in the last 168 hours.   Radiology    No results found.  Cardiac Studies   2D echo 06/2019 IMPRESSIONS   1. Left ventricular ejection fraction, by visual estimation, is 55 to 60%. The left ventricle has normal function. There is mildly increased left ventricular hypertrophy. 2. Left ventricular diastolic function could not be evaluated. 3. Right ventricular volume/pressure overload. 4. The left ventricle demonstrates regional wall motion abnormalities. 5. Global right ventricle has severely reduced systolic function.The right ventricular size is severely enlarged. No increase in right ventricular wall thickness. 6. Left atrial size was normal. 7. Right atrial size was mild-moderately dilated. 8. Presence of pericardial fat pad. 9. Trivial pericardial  effusion is present. 10. Mild mitral annular calcification. 11. The mitral valve is degenerative. Trivial mitral valve regurgitation. 12. The tricuspid valve is grossly normal. Tricuspid valve regurgitation is trivial. 13. The aortic valve is tricuspid. Aortic valve regurgitation is not visualized. No evidence of aortic valve sclerosis or stenosis. 14. The pulmonic valve was grossly normal. Pulmonic valve regurgitation is not visualized. 15. TR signal is inadequate for assessing pulmonary artery systolic pressure. 16. The inferior vena cava is normal in size with greater than 50% respiratory variability, suggesting right atrial pressure of 3 mmHg. 17. No prior Echocardiogram.  Patient Profile     58 y.o. male with recent septic knee (prosthetic joint infection), MRSA bacteremia who is being followed for post-op afib with RVR  Assessment & Plan    ATRIAL FIB:   TEE and cardioversion not acceptable at this time.  Would hold off for Monday.  Anticoagulation has been held at this time.  Working with rate control as he is likely not a candidate for cardiac procedures at this time.  We Ashby Leflore add p.o. diltiazem to hopefully help improve his heart rate.  He is currently on high doses of Toprol-XL.  Did have bleeding from knee incision.  Anticoagulation was thus stopped.  Would restart once bleeding risks have abated.  HTN: Well-controlled.  We Miki Labuda hold off on blood pressure medications at this time.  For questions or updates, please contact [place] Please consult [email or web address] for contact info under Cardiology/STEMI.   Signed, Teale Goodgame Meredith Leeds, MD  07/23/2019, 10:52 AM

## 2019-07-23 NOTE — Progress Notes (Signed)
Attempted left lower extremity venous duplex, however patient is combative and uncooperative. Please notify us if/when patient is amenable to exam and able to lie still for exam. Thank you  07/23/2019 11:17 AM Maudry Mayhew, MHA, RVT, RDCS, RDMS

## 2019-07-23 NOTE — Progress Notes (Addendum)
[facility]  PROGRESS NOTE  [name] [id number] DOB: [birth year] DOA: 07/12/2019 PCP: Nicholes Rough, PA-C Admit date - 07/12/2019   Admitting Physician Rush Farmer, MD  Outpatient Primary MD for the patient is Nicholes Rough, PA-C  LOS - 10 Brief Narrative   Vernon Frye is a 58 y.o. year old male with medical history significant for Afib not taking prescribed Eliquis is unable to afford and only on aspirin, anxiety, arthritis, hypertension, left total knee arthroplasty in 2016 who presented on 07/12/2019 with left knee pain and swelling x1 day with inability to bear weight or bend knee and was found to have A. fib with RVR with initial lactic acid of 4.2, and WBC of 16 consistent with sepsis presume related to right knee and required Cardizem infusion for rate control.  Underwent left knee aspiration on 12/16 with 120 cc of purulent synovial fluid, was taken to the OR on 12/16 for septic left total knee replacement by [name].  Monitored in ICU after operation, status post extubation, course complicated by toxic metabolic encephalopathy requiring Precedex infusion in the setting of MRSA bacteremia secondary to septic right knee joint  Hospital course complicated by concern for vancomycin tolerance.  Vanco started in the ED during volume resuscitation patient became erythematous with blanching facial rash extending from neck to clavicle prompting discontinuation of bank and supportive treatment with Benadryl, Pepcid.  Patient became tachypneic and desaturated to 80% start on 3 L nasal cannula for which PCCM was consulted on admission.  Currently hospital course complicated by delirium, persistent Afib RVR,   Subjective  Still requiring intermittent haldol and ativan for agitation and confusion. HR ranging 110-140s  A & P   MRSA bacteremia secondary to septic right knee joint, status post washout and antibiotic spacer placement Initially on daptomycin due to concern for  allergic drug reaction after vancomycin initiation, currently tolerating vancomycin, TTE negative for vegetations.  Underwent emergent source control with operative debridement on 12/16 by orthopedics.  Sepsis physiology resolved.  Repeat blood cultures (12/18) unremarkable MRI lumbar spine negative -Plan for TEE by cardiology on 12/28; (of note patient declined initial TEE) per cardiology -Tolerating IV vancomycin--per pharmacy, per ID, to continue both 6 weeks course per ID, OPAT orders in place --CT knee shows large effusion: unclear if purulent effusion or hematoma, discussed with on call orthopedic on evening of 12/26 given no fever, downtrending white count, no active bleed or purulent drainage less likely purulent. Advised to elevate leg, apply dressing to postoperative site, and apply TED hose, they will reassess in am --obtaining venous duplex( doubt DVT given was previously on anticoagulation)  Postoperative Atrial fibrillation with RVR Agitation has made RVR difficult to control Nonadherent with Eliquis as outpatient Declining TEE/cardioversion on 12/24 Declined Eliquis ernight -Appreciate cardiology recs: Cardizem to 90 mg every 6 hours for now, continue Toprol-XL 200 mg daily, IV metoprolol PRN HR > 130 -Cardiology plans for TEE/cardioversion, hopefully on Friday pending discussion with family -Continue Lovenox -Switched to xarelto to improve adherence given only daily dosing-currently on hold due to bleeding from kee  Acute toxic metabolic encephalopathy, secondary to agitated delirium, waxing and waning.   Presume in the setting of sepsis as well as high concern for polysubstance abuse/alcohol abuse/withdrawal Previous required Precedex infusion in ICU Was alert and oriented x4  On 12/24 but worsening agitation overnight with need for haldol, ativan, and frequent redirection on 12/25 and 12/26 -continue supplementation with thiamine, folate, multivitamin -Continue scheduled  Seroquel --Delirium  precautions, moved closer to nursing station -As needed Haldol, Ativan for agitation -- CT head wo to rule acute intracranial pathology  Left upper extremity swelling related to IV line site -Venous duplex negative for DVT  Acute respiratory insufficiency with hypoxia, resolved Intubated during operative debridement of knee next abated without difficulty. 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  Now back to baseline 0.93 -Avoid nephrotoxins, monitor BMP, monitor output   Family Communication  : 12/26 called mother at listed [phone], incorrect number  Code Status : Full  Disposition Plan  : TEE  most likely 12/28, continue IV vancomycin, close monitoring of agitation, close monitoring of RVR,   Consults  : PCCM, cardiology, ID, orthopedics  Procedures  :  12/16 septic left total knee replacement TTE, 12/19 55-60% Venous DVT, 12/16  Bilateral lower extremities, negative for DVT  DVT Prophylaxis  :  Lovenox  Lab Results  Component Value Date   PLT PLATELET CLUMPS NOTED ON SMEAR, UNABLE TO ESTIMATE 07/23/2019    Diet :  Diet Order            DIET DYS 3 Room service appropriate? No; Fluid consistency: Thin  Diet effective now               Inpatient Medications Scheduled Meds: . diltiazem  90 mg Oral Q6H  . docusate sodium  100 mg Oral BID  . folic acid   1 mg Oral Daily  . lisinopril  10 mg Oral Daily  . metoprolol succinate  200 mg Oral Daily  . multivitamin with minerals  1 tablet Oral Daily  . QUEtiapine  25 mg Oral BID  . thiamine  100 mg Oral Daily   Or  . thiamine  100 mg Intravenous Daily   Continuous Infusions: . sodium chloride 50 mL/hr at 07/21/19 0134  . sodium chloride Stopped (07/22/19 0158)  . vancomycin 1,500 mg (07/23/19 1408)   PRN Meds:.diphenhydrAMINE, haloperidol lactate, LORazepam, menthol-cetylpyridinium **OR** phenol, metoprolol tartrate, ondansetron **OR** ondansetron (ZOFRAN) IV, oxyCODONE, Resource ThickenUp Clear, sodium chloride flush  Antibiotics  :   Anti-infectives (From admission, onward)   Start     Dose/Rate Route Frequency Ordered Stop   07/22/19 1000  vancomycin (VANCOREADY) IVPB 1500 mg/300 mL     1,500 mg 75 mL/hr over 240 Minutes Intravenous Every 12 hours 07/22/19 0237     07/19/19 1300  vancomycin (VANCOCIN) IVPB 1000 mg/200 mL premix  Status:  Discontinued     1,000 mg 66.7 mL/hr over 180 Minutes Intravenous Every 12 hours 07/19/19 1145 07/22/19 0236   07/14/19 2000  DAPTOmycin (CUBICIN) 1,000 mg in sodium chloride 0.9 % IVPB  Status:  Discontinued     1,000 mg 240 mL/hr over 30 Minutes Intravenous Daily 07/14/19 0807 07/19/19 1145   07/13/19 2119  vancomycin (VANCOCIN) powder  Status:  Discontinued       As needed 07/13/19 2119 07/13/19 2154   07/13/19 1400  DAPTOmycin (CUBICIN) 944 mg in sodium chloride 0.9 % IVPB  Status:  Discontinued  8 mg/kg  118 kg 237.8 mL/hr over 30 Minutes Intravenous Daily 07/13/19 1320 07/14/19 0807   07/13/19 1015  piperacillin-tazobactam (ZOSYN) IVPB 3.375 g  Status:  Discontinued     3.375 g 12.5 mL/hr over 240 Minutes Intravenous Every 8 hours 07/13/19 1002 07/13/19 1535   07/13/19 0400  cefTRIAXone (ROCEPHIN) 2 g in sodium chloride 0.9 % 100 mL IVPB  Status:  Discontinued     2 g 200 mL/hr over 30 Minutes Intravenous Every 24 hours 07/13/19 0337  07/13/19 0951   07/13/19 0400  vancomycin (VANCOCIN) 2,000 mg in sodium chloride 0.9 % 500 mL IVPB     2,000 mg 250 mL/hr over 120 Minutes Intravenous  Once 07/13/19 0339 07/13/19 0707   07/13/19 0339  vancomycin variable dose per unstable renal function (pharmacist dosing)  Status:  Discontinued      Does not apply See admin instructions 07/13/19 0340 07/13/19 0911       Objective   Vitals:   07/23/19 0810 07/23/19 1235 07/23/19 1239 07/23/19 1500  BP: 113/78 (!) 119/93 (!) 119/93   Pulse:      Resp:   (!) 30 20  Temp:      TempSrc:      SpO2:      Weight:      Height:        SpO2: 99 % O2 Flow Rate (L/min): 0.5 L/min FiO2 (%): 30 %  Wt Readings from Last 3 Encounters:  07/23/19 107 kg  09/16/18 117.9 kg  06/19/15 114 kg     Intake/Output Summary (Last 24 hours) at 07/23/2019 1905 Last data filed at 07/23/2019 1835 Gross per 24 hour  Intake 910 ml  Output 2750 ml  Net -1840 ml    Physical Exam:  Awake, alert only to self, constantly trying to get out of bed Able to be redirected  No new F.N deficits,   Symmetrical Chest wall movement, Good air movement bilaterally on room air, CTAB Irregularly irregular rhythm, tachcycardic Left knee wrapped in bandage . Postoperative incision clean, dry, intact Significant 3 +pitting edema from below knee to foot           I have personally reviewed the following:   Data Reviewed:  CBC Recent Labs  Lab 07/18/19 0233 07/20/19 0222 07/21/19 0622 07/22/19 0106 07/23/19 0535  WBC 19.1* 21.1* 17.1* 18.7* 14.8*  HGB 11.1* 11.1* 10.9* 10.5* 8.5*  HCT 32.6* 33.4* 33.7* 32.3* 26.4*  PLT 176 311 293 380 PLATELET CLUMPS NOTED ON SMEAR, UNABLE TO ESTIMATE  MCV 85.8 88.1 89.4 89.5 93.0  MCH 29.2 29.3 28.9 29.1 29.9  MCHC 34.0 33.2 32.3 32.5 32.2  RDW 14.6 15.1 15.4 15.4 15.2  LYMPHSABS  --  2.0  --   --   --   MONOABS  --  0.9  --   --   --   EOSABS  --  0.0  --   --   --   BASOSABS  --  0.1  --   --   --      Chemistries  Recent Labs  Lab 07/18/19 0233 07/19/19 0527 07/20/19 0222 07/21/19 0350 07/23/19 1623  NA 143 142 140 138 139  K 3.9 3.9 3.9 4.3 3.2*  CL 109 109 105 107 107  CO2 22 20* 20* 18* 23  GLUCOSE 167* 156* 169* 183* 152*  BUN 38* 31* 22* 20 13  CREATININE 1.00 1.09 0.93 0.82 0.72  CALCIUM 7.9* 7.7* 7.6* 7.3* 7.7*  AST  --   --   --   --  69*  ALT  --   --   --   --  96*  ALKPHOS  --   --   --   --  102  BILITOT  --   --   --   --  1.7*   ------------------------------------------------------------------------------------------------------------------ No results for input(s): CHOL, HDL, LDLCALC, TRIG, CHOLHDL, LDLDIRECT in the last 72 hours.  Lab Results  Component Value Date   HGBA1C 6.3 (H) 07/13/2019   ------------------------------------------------------------------------------------------------------------------ No results for input(s): TSH, T4TOTAL, T3FREE, THYROIDAB in the last 72 hours.  Invalid input(s): FREET3 ------------------------------------------------------------------------------------------------------------------ No results for input(s): VITAMINB12, FOLATE, FERRITIN, TIBC, IRON, RETICCTPCT in the last 72 hours.  Coagulation profile No results for input(s): INR, PROTIME in the last 168 hours.  No results for input(s): DDIMER in the last 72 hours.  Cardiac Enzymes No results for input(s): CKMB, TROPONINI, MYOGLOBIN in the last 168 hours.  Invalid input(s): CK ------------------------------------------------------------------------------------------------------------------    Component Value Date/Time   BNP 299.7 (H) 07/13/2019 0431    Micro Results Recent Results (from the past 240 hour(s))  Culture, blood (routine x 2)     Status: Abnormal   Collection Time: 07/14/19  9:50 AM   Specimen: BLOOD  Result Value Ref Range Status   Specimen Description BLOOD RIGHT ANTECUBITAL  Final   Special Requests   Final    BOTTLES DRAWN  AEROBIC ONLY Blood Culture adequate volume   Culture  Setup Time   Final    GRAM POSITIVE COCCI AEROBIC BOTTLE ONLY CRITICAL VALUE NOTED.  VALUE IS CONSISTENT WITH PREVIOUSLY REPORTED AND CALLED VALUE.    Culture (A)  Final    STAPHYLOCOCCUS AUREUS SUSCEPTIBILITIES PERFORMED ON PREVIOUS CULTURE WITHIN THE LAST 5 DAYS. Performed at [facility], [place] [address] [place], [place] [address]    Report Status 07/17/2019 FINAL  Final  Culture, blood (routine x 2)     Status: None   Collection Time: 07/14/19  9:56 AM   Specimen: BLOOD RIGHT HAND  Result Value Ref Range Status   Specimen Description BLOOD RIGHT HAND  Final   Special Requests   Final    BOTTLES DRAWN AEROBIC ONLY Blood Culture results may not be optimal due to an inadequate volume of blood received in culture bottles   Culture   Final    NO GROWTH 5 DAYS Performed at [facility], [place] [address], [place], [place] [address]    Report Status 07/19/2019 FINAL  Final  Culture, blood (routine x 2)     Status: None   Collection Time: 07/15/19 12:21 PM   Specimen: BLOOD RIGHT HAND  Result Value Ref Range Status   Specimen Description BLOOD RIGHT HAND  Final   Special Requests   Final    BOTTLES DRAWN AEROBIC ONLY Blood Culture results may not be optimal due to an inadequate volume of blood received in culture bottles   Culture   Final    NO GROWTH 5 DAYS Performed at [facility], [place] [address] [place], [place] [address]    Report Status 07/20/2019 FINAL  Final  Culture, blood (routine x 2)     Status: None   Collection Time: 07/15/19  1:20 PM   Specimen: BLOOD RIGHT ARM  Result Value Ref Range Status   Specimen Description BLOOD RIGHT ARM  Final   Special Requests   Final    BOTTLES DRAWN AEROBIC ONLY Blood Culture adequate volume   Culture   Final    NO  GROWTH 5 DAYS Performed at [facility], [place] [address] [place], [place] [address]    Report Status 07/20/2019 FINAL  Final    Radiology  Reports MR Lumbar Spine W Wo Contrast  Result Date: 07/18/2019 CLINICAL DATA:  Back pain. Sepsis. EXAM: MRI LUMBAR SPINE WITHOUT AND WITH CONTRAST TECHNIQUE: Multiplanar and multiecho pulse sequences of the lumbar spine were obtained without and with intravenous contrast. CONTRAST:  63mL GADAVIST GADOBUTROL 1 MMOL/ML IV SOLN COMPARISON:  None. FINDINGS: Segmentation:  Standard. Alignment:  Physiologic. Vertebrae: There is diffuse mottled low signal throughout the bones of the spine and sacrum. The patient is anemic and this may represent reactivated red marrow. Does the patient have any other hematologic disorders? No fractures or bone destruction. There is a small amount of fluid in the L4-5 and L5-S1 disc spaces. No findings suggestive of osteomyelitis. Conus medullaris and cauda equina: Conus extends to the T12-L1 level. Conus and cauda equina appear normal. Paraspinal and other soft tissues: Negative. Disc levels: L1-2: Normal disc. Slight hypertrophy of the facet joints. L2-3: Tiny central disc bulge with no neural impingement. Slight hypertrophy of the facet joints and ligamentum flavum without neural impingement. L3-4: Tiny central disc bulge with an annular fissure. No neural impingement. Facet joints are normal. L4-5: Chronic degenerative changes of the vertebral endplates. Tiny amount of fluid in the disc space. I doubt that this represents discitis. No significant enhancement after contrast administration. There is a small broad-based disc bulge with accompanying osteophytes extending into the right neural foramen without neural impingement. Slight hypertrophy of the right ligamentum flavum. Facet joints are otherwise normal. L5-S1: Tiny amount of fluid in the left anterolateral aspect of the disc space, likely degenerative in origin. No appreciable enhancement after contrast administration. Tiny broad-based disc bulge without neural impingement. No foraminal stenosis. No facet arthritis. IMPRESSION:  1. No discrete osteomyelitis or discitis in the lumbar spine. Small amounts of fluid in the L4-5 and L5-S1 disc spaces are likely degenerative in origin. 2. Multilevel degenerative disc disease without neural impingement. 3. Diffuse mottled low signal throughout the bones of the spine and sacrum which may represent reactivated red marrow. The patient is anemic. Electronically Signed   By: Lorriane Shire M.D.   On: 07/18/2019 18:34   US Renal  Result Date: 07/13/2019 CLINICAL DATA:  Acute renal injury EXAM: RENAL / URINARY TRACT ULTRASOUND COMPLETE COMPARISON:  None. FINDINGS: Right Kidney: Renal measurements: 14.7 x 6.1 x 6.6 cm. = volume: 310 mL . Echogenicity within normal limits. No mass or hydronephrosis visualized. Left Kidney: Renal measurements: 15.0 x 7.2 x 6.9 cm = volume: 385 mL. Mild hydronephrosis is noted with a prominent extrarenal pelvis. Bladder: Appears normal for degree of bladder distention. Other: None. IMPRESSION: Mild left-sided hydronephrosis is noted. Electronically Signed   By: Inez Catalina M.D.   On: 07/13/2019 10:40   CT KNEE LEFT W CONTRAST  Result Date: 07/23/2019 CLINICAL DATA:  Progressive right knee and leg pain and swelling and joint effusion. Radiographs dated 07/12/2019 previous total knee replacement. Septic left total knee replacement. Irrigation and debridement with poly exchange on 07/13/2019 EXAM: CT OF THE RIGHT KNEE WITH CONTRAST TECHNIQUE: Multidetector CT imaging was performed following the standard protocol during bolus administration of intravenous contrast. CONTRAST:  13mL OMNIPAQUE IOHEXOL 300 MG/ML  SOLN COMPARISON:  RADIOGRAPHS DATED 07/12/2019 FINDINGS: Bones/Joint/Cartilage The components of the total knee prosthesis appear in good position with no evidence of osteomyelitis or prosthetic component loosening. There is a moderate  joint effusion. In addition, there is a prominent Baker's cyst measuring 8.5 x 4.1 x 2.1 cm. With some enhancement of the rim of  the cyst. Muscles and Tendons Negative. Soft tissues There is a prominent soft tissue fluid collection anterior to the knee joint anterior to the quadriceps tendon and muscles and anterior to the patella. This could represent seroma or an extensive subcutaneous abscess. This fluid collection extends 2/3 of the circumference of knee, best seen on image 91 of series 5. IMPRESSION: 1. Large subcutaneous fluid collection at the anterior and medial and lateral aspects of the knee which could represent abscess or seroma or hematoma. 2. Moderate joint effusion or pus in the joint. 3. Prominent Baker's cyst which could contain  joint fluid or pus. 4. No osteomyelitis or other acute bone abnormality. Electronically Signed   By: Lorriane Shire M.D.   On: 07/23/2019 14:16   DG CHEST PORT 1 VIEW  Result Date: 07/13/2019 CLINICAL DATA:  Shortness of breath EXAM: PORTABLE CHEST 1 VIEW COMPARISON:  07/13/2019 FINDINGS: The heart size is enlarged. There are scattered airspace opacities primarily at the lung bases which are new from prior study. There is a small left-sided pleural effusion. There is no pneumothorax. No acute osseous abnormality. IMPRESSION: 1. Low lung volumes with probable postoperative atelectasis at the lung bases. 2. Probable small left-sided pleural effusion. 3. Cardiomegaly. Electronically Signed   By: Constance Holster M.D.   On: 07/13/2019 23:52   DG Chest Port 1 View  Result Date: 07/13/2019 CLINICAL DATA:  Atrial fibrillation, hypoxia EXAM: PORTABLE CHEST 1 VIEW COMPARISON:  09/16/2018 FINDINGS: Lungs are clear. No pleural effusion or pneumothorax. Top-normal heart size. IMPRESSION: No acute process in the chest. Electronically Signed   By: Macy Mis M.D.   On: 07/13/2019 07:29   DG Knee Complete 4 Views Left  Result Date: 07/12/2019 CLINICAL DATA:  Knee swelling and redness EXAM: LEFT KNEE - COMPLETE 4+ VIEW COMPARISON:  06/29/2015 FINDINGS: Prior left knee replacement, stable.  Moderate joint effusion. No hardware complicating feature. No fracture, subluxation or dislocation. IMPRESSION: Prior left knee replacement. Moderate joint effusion. No acute bony abnormality. Electronically Signed   By: Rolm Baptise M.D.   On: 07/12/2019 19:30   ECHOCARDIOGRAM COMPLETE  Result Date: 07/16/2019   ECHOCARDIOGRAM REPORT   Patient Name:   GOEBEL TRUMP Date of Exam: 07/13/2019 Medical Rec #:  [id number]       Height:       75.0 in Accession #:    [id number]      Weight:       260.0 lb Date of Birth:  [birth year]      BSA:          2.45 m Patient Age:    9 years        BP:           101/68 mmHg Patient Gender: M               HR:           124 bpm. Exam Location:  Inpatient Procedure: 2D Echo STAT ECHO Indications:     Atrial Fibrillation 427.31/ I48.91  History:         Patient has no prior history of Echocardiogram examinations.                  Risk Factors:Hypertension. Left knee sepitc joint, acute kidney  injury, allergic reaction to antibiotic, COVID-19 test                  pending,.  Sonographer:     Darlina Sicilian RDCS Referring Phys:  [place] Diagnosing Phys: Eleonore Chiquito MD  Sonographer Comments: Patient condition. IMPRESSIONS  1. Left ventricular ejection fraction, by visual estimation, is 55 to 60%. The left ventricle has normal function. There is mildly increased left ventricular hypertrophy.  2. Left ventricular diastolic function could not be evaluated.  3. Right ventricular volume/pressure overload.  4. The left ventricle demonstrates regional wall motion abnormalities.  5. Global right ventricle has severely reduced systolic function.The right ventricular size is severely enlarged. No increase in right ventricular wall thickness.  6. Left atrial size was normal.  7. Right atrial size was mild-moderately dilated.  8. Presence of pericardial fat pad.  9. Trivial pericardial effusion is present. 10. Mild mitral annular calcification. 11. The mitral  valve is degenerative. Trivial mitral valve regurgitation. 12. The tricuspid valve is grossly normal. Tricuspid valve regurgitation is trivial. 13. The aortic valve is tricuspid. Aortic valve regurgitation is not visualized. No evidence of aortic valve sclerosis or stenosis. 14. The pulmonic valve was grossly normal. Pulmonic valve regurgitation is not visualized. 15. TR signal is inadequate for assessing pulmonary artery systolic pressure. 16. The inferior vena cava is normal in size with greater than 50% respiratory variability, suggesting right atrial pressure of 3 mmHg. 17. No prior Echocardiogram. FINDINGS  Left Ventricle: Left ventricular ejection fraction, by visual estimation, is 55 to 60%. The left ventricle has normal function. The left ventricle demonstrates regional wall motion abnormalities. The left ventricular internal cavity size was the left ventricle is normal in size. There is mildly increased left ventricular hypertrophy. Abnormal (paradoxical) septal motion, consistent with right ventricular volume overload and/or elevated RV end-diastolic pressure. The left ventricular diastology could not be evaluated due to atrial fibrillation. Left ventricular diastolic function could not be evaluated. Right Ventricle: The right ventricular size is severely enlarged. No increase in right ventricular wall thickness. Global RV systolic function is has severely reduced systolic function. Left Atrium: Left atrial size was normal in size. Right Atrium: Right atrial size was mild-moderately dilated Pericardium: Trivial pericardial effusion is present. Presence of pericardial fat pad. Mitral Valve: The mitral valve is degenerative in appearance. Mild mitral annular calcification. Trivial mitral valve regurgitation. Tricuspid Valve: The tricuspid valve is grossly normal. Tricuspid valve regurgitation is trivial. Aortic Valve: The aortic valve is tricuspid. Aortic valve regurgitation is not visualized. The aortic  valve is structurally normal, with no evidence of sclerosis or stenosis. Pulmonic Valve: The pulmonic valve was grossly normal. Pulmonic valve regurgitation is not visualized. Pulmonic regurgitation is not visualized. Aorta: The aortic root and ascending aorta are structurally normal, with no evidence of dilitation. Venous: The inferior vena cava is normal in size with greater than 50% respiratory variability, suggesting right atrial pressure of 3 mmHg. IAS/Shunts: No atrial level shunt detected by color flow Doppler.  LEFT VENTRICLE PLAX 2D LVIDd:         4.20 cm LVIDs:         2.90 cm LV PW:         1.50 cm LV IVS:        1.40 cm LVOT diam:     1.90 cm LV SV:         46 ml LV SV Index:   18.34 LVOT Area:     2.84 cm  LEFT ATRIUM            Index LA diam:      4.00 cm  1.63 cm/m LA Vol (A4C): 104.0 ml 42.39 ml/m   AORTA Ao Root diam: 2.70 cm MITRAL VALVE MV Area (PHT): 5.34 cm            SHUNTS MV PHT:        41.18 msec          Systemic Diam: 1.90 cm MV Decel Time: 142 msec MV E velocity: 88.83 cm/s 103 cm/s  Eleonore Chiquito MD Electronically signed by Eleonore Chiquito MD Signature Date/Time: 07/13/2019/8:36:27 AM    Final (Updating)    VAS [place] LOWER EXTREMITY VENOUS (DVT)  Result Date: 07/13/2019  Lower Venous Study Indications: Edema.  Comparison Study: no prior Performing Technologist: Abram Sander RVS  Examination Guidelines: A complete evaluation includes B-mode imaging, spectral Doppler, color Doppler, and power Doppler as needed of all accessible portions of each vessel. Bilateral testing is considered an integral part of a complete examination. Limited examinations for reoccurring indications may be performed as noted.  +---------+---------------+---------+-----------+----------+--------------+ RIGHT    CompressibilityPhasicitySpontaneityPropertiesThrombus Aging +---------+---------------+---------+-----------+----------+--------------+ CFV      Full           Yes      Yes                                  +---------+---------------+---------+-----------+----------+--------------+ SFJ      Full                                                        +---------+---------------+---------+-----------+----------+--------------+ FV Prox  Full                                                        +---------+---------------+---------+-----------+----------+--------------+ FV Mid   Full                                                        +---------+---------------+---------+-----------+----------+--------------+ FV DistalFull                                                        +---------+---------------+---------+-----------+----------+--------------+ PFV      Full                                                        +---------+---------------+---------+-----------+----------+--------------+ POP      Full           Yes      Yes                                 +---------+---------------+---------+-----------+----------+--------------+  PTV      Full                                                        +---------+---------------+---------+-----------+----------+--------------+ PERO     Full                                                        +---------+---------------+---------+-----------+----------+--------------+   +---------+---------------+---------+-----------+----------+--------------+ LEFT     CompressibilityPhasicitySpontaneityPropertiesThrombus Aging +---------+---------------+---------+-----------+----------+--------------+ CFV      Full           Yes      Yes                                 +---------+---------------+---------+-----------+----------+--------------+ SFJ      Full                                                        +---------+---------------+---------+-----------+----------+--------------+ FV Prox  Full                                                         +---------+---------------+---------+-----------+----------+--------------+ FV Mid   Full                                                        +---------+---------------+---------+-----------+----------+--------------+ FV DistalFull                                                        +---------+---------------+---------+-----------+----------+--------------+ PFV      Full                                                        +---------+---------------+---------+-----------+----------+--------------+ POP      Full           Yes      Yes                                 +---------+---------------+---------+-----------+----------+--------------+ PTV      Full                                                        +---------+---------------+---------+-----------+----------+--------------+  PERO                                                  Not visualized +---------+---------------+---------+-----------+----------+--------------+     Summary: Right: There is no evidence of deep vein thrombosis in the lower extremity. No cystic structure found in the popliteal fossa. Left: There is no evidence of deep vein thrombosis in the lower extremity. No cystic structure found in the popliteal fossa.  *See table(s) above for measurements and observations. Electronically signed by Monica Martinez MD on 07/13/2019 at 12:52:52 PM.    Final    VAS [place] UPPER EXTREMITY VENOUS DUPLEX  Result Date: 07/21/2019 UPPER VENOUS STUDY  Indications: Swelling Risk Factors: None identified. Limitations: Poor ultrasound/tissue interface and bandages. Comparison Study: No prior studies. Performing Technologist: Oliver Hum RVT  Examination Guidelines: A complete evaluation includes B-mode imaging, spectral Doppler, color Doppler, and power Doppler as needed of all accessible portions of each vessel. Bilateral testing is considered an integral part of a complete examination. Limited examinations  for reoccurring indications may be performed as noted.  Right Findings: +----------+------------+---------+-----------+----------+-------+ RIGHT     CompressiblePhasicitySpontaneousPropertiesSummary +----------+------------+---------+-----------+----------+-------+ Subclavian    Full       Yes       Yes                      +----------+------------+---------+-----------+----------+-------+  Left Findings: +----------+------------+---------+-----------+----------+-------+ LEFT      CompressiblePhasicitySpontaneousPropertiesSummary +----------+------------+---------+-----------+----------+-------+ IJV           Full       Yes       Yes                      +----------+------------+---------+-----------+----------+-------+ Subclavian    Full       Yes       Yes                      +----------+------------+---------+-----------+----------+-------+ Axillary      Full       Yes       Yes                      +----------+------------+---------+-----------+----------+-------+ Brachial      Full       Yes       Yes                      +----------+------------+---------+-----------+----------+-------+ Radial        Full                                          +----------+------------+---------+-----------+----------+-------+ Ulnar         Full                                          +----------+------------+---------+-----------+----------+-------+ Cephalic      None                                   Acute  +----------+------------+---------+-----------+----------+-------+ Basilic  Full                                          +----------+------------+---------+-----------+----------+-------+  Summary:  Right: No evidence of thrombosis in the subclavian.  Left: No evidence of deep vein thrombosis in the upper extremity. Findings consistent with acute superficial vein thrombosis involving the left cephalic vein.  *See table(s) above for  measurements and observations.  Diagnosing physician: Monica Martinez MD Electronically signed by Monica Martinez MD on 07/21/2019 at 5:09:20 PM.    Final    [place] EKG SITE RITE  Result Date: 07/16/2019 If Site Rite image not attached, placement could not be confirmed due to current cardiac rhythm.    Time Spent in minutes  30     Desiree Hane M.D on 07/23/2019 at 7:05 PM  To page go to [email or web address] - password [facility]

## 2019-07-23 NOTE — Progress Notes (Signed)
Consult was placed to IV Team for new iv site;  Pt needing CT;  Pt confused, combative, even with 2 staff members attempting to hold him;  Attempted x 1 to start iv, but pt moving and too tense, clamping down;  No further attempts made; RN has paged MD for sedation order.  Will have RN place new consult when medication has taken effect.

## 2019-07-24 ENCOUNTER — Inpatient Hospital Stay (HOSPITAL_COMMUNITY): Payer: Self-pay

## 2019-07-24 ENCOUNTER — Encounter (HOSPITAL_COMMUNITY): Payer: Self-pay

## 2019-07-24 DIAGNOSIS — T8450XA Infection and inflammatory reaction due to unspecified internal joint prosthesis, initial encounter: Secondary | ICD-10-CM

## 2019-07-24 LAB — CBC
HCT: 31.3 % — ABNORMAL LOW (ref 39.0–52.0)
Hemoglobin: 10 g/dL — ABNORMAL LOW (ref 13.0–17.0)
MCH: 29.1 pg (ref 26.0–34.0)
MCHC: 31.9 g/dL (ref 30.0–36.0)
MCV: 91 fL (ref 80.0–100.0)
Platelets: 423 10*3/uL — ABNORMAL HIGH (ref 150–400)
RBC: 3.44 MIL/uL — ABNORMAL LOW (ref 4.22–5.81)
RDW: 15.2 % (ref 11.5–15.5)
WBC: 18.5 10*3/uL — ABNORMAL HIGH (ref 4.0–10.5)
nRBC: 0 % (ref 0.0–0.2)

## 2019-07-24 LAB — BASIC METABOLIC PANEL
Anion gap: 9 (ref 5–15)
BUN: 12 mg/dL (ref 6–20)
CO2: 22 mmol/L (ref 22–32)
Calcium: 7.7 mg/dL — ABNORMAL LOW (ref 8.9–10.3)
Chloride: 110 mmol/L (ref 98–111)
Creatinine, Ser: 0.76 mg/dL (ref 0.61–1.24)
GFR calc Af Amer: >60 mL/min (ref >60)
GFR calc non Af Amer: >60 mL/min (ref >60)
Glucose, Bld: 145 mg/dL — ABNORMAL HIGH (ref 70–99)
Potassium: 3.7 mmol/L (ref 3.5–5.1)
Sodium: 141 mmol/L (ref 135–145)

## 2019-07-24 MED ORDER — MAGNESIUM SULFATE 4 GM/100ML IV SOLN
4.0000 g | Freq: Once | INTRAVENOUS | Status: AC
  Administered 2019-07-24: 4 g via INTRAVENOUS
  Filled 2019-07-24: qty 100

## 2019-07-24 NOTE — Progress Notes (Signed)
Progress Note  Patient Name: Vernon Frye Date of Encounter: 07/24/2019  Primary Cardiologist:   Sinclair Grooms, MD   Subjective   Continued agitation and confusion.  Receiving Ativan.  He now has IV access.  Inpatient Medications    Scheduled Meds: . diltiazem  90 mg Oral Q6H  . docusate sodium  100 mg Oral BID  . folic acid  1 mg Oral Daily  . lisinopril  10 mg Oral Daily  . metoprolol succinate  200 mg Oral Daily  . multivitamin with minerals  1 tablet Oral Daily  . QUEtiapine  25 mg Oral BID  . thiamine  100 mg Oral Daily   Or  . thiamine  100 mg Intravenous Daily   Continuous Infusions: . sodium chloride 50 mL/hr at 07/24/19 0600  . sodium chloride Stopped (07/22/19 0158)  . vancomycin 1,500 mg (07/24/19 0134)   PRN Meds: diphenhydrAMINE, haloperidol lactate, LORazepam, menthol-cetylpyridinium **OR** phenol, metoprolol tartrate, ondansetron **OR** ondansetron (ZOFRAN) IV, oxyCODONE, Resource ThickenUp Clear, sodium chloride flush   Vital Signs    Vitals:   07/23/19 1949 07/23/19 2000 07/23/19 2341 07/24/19 0405  BP: (!) 122/94  (!) 145/103 (!) 134/105  Pulse: (!) 118  (!) 108 (!) 119  Resp: 20 (!) 22 20 20   Temp: 99.1 F (37.3 C)  99.5 F (37.5 C) 98.8 F (37.1 C)  TempSrc: Axillary  Oral Oral  SpO2: 100%  100% 98%  Weight:    106.1 kg  Height:        Intake/Output Summary (Last 24 hours) at 07/24/2019 [id number] Last data filed at 07/24/2019 0600 Gross per 24 hour  Intake 3901.67 ml  Output 2600 ml  Net 1301.67 ml   Filed Weights   07/21/19 0521 07/23/19 0426 07/24/19 0405  Weight: 111.9 kg 107 kg 106.1 kg    Telemetry    Atrial fibrillation with rapid rates- Personally Reviewed  ECG    NA - Personally Reviewed  Physical Exam   GEN: Well nourished, well developed, in no acute distress  HEENT: normal  Neck: no JVD, carotid bruits, or masses Cardiac: Irregular, tachycardic; no murmurs, rubs, or gallops,no edema  Respiratory:  clear  to auscultation bilaterally, normal work of breathing GI: soft, nontender, nondistended, + BS MS: no deformity or atrophy  Skin: warm and dry Neuro:  Strength and sensation are intact Psych: Confused     Labs    Chemistry Recent Labs  Lab 07/21/19 0350 07/23/19 1623 07/24/19 0404  NA 138 139 141  K 4.3 3.2* 3.7  CL 107 107 110  CO2 18* 23 22  GLUCOSE 183* 152* 145*  BUN 20 13 12   CREATININE 0.82 0.72 0.76  CALCIUM 7.3* 7.7* 7.7*  PROT  --  6.3*  --   ALBUMIN  --  2.1*  --   AST  --  69*  --   ALT  --  96*  --   ALKPHOS  --  102  --   BILITOT  --  1.7*  --   GFRNONAA >60 >60 >60  GFRAA >60 >60 >60  ANIONGAP 13 9 9      Hematology Recent Labs  Lab 07/22/19 0106 07/23/19 0535 07/24/19 0404  WBC 18.7* 14.8* 18.5*  RBC 3.61* 2.84* 3.44*  HGB 10.5* 8.5* 10.0*  HCT 32.3* 26.4* 31.3*  MCV 89.5 93.0 91.0  MCH 29.1 29.9 29.1  MCHC 32.5 32.2 31.9  RDW 15.4 15.2 15.2  PLT 380 PLATELET CLUMPS NOTED ON SMEAR,  UNABLE TO ESTIMATE 423*    Cardiac EnzymesNo results for input(s): TROPONINI in the last 168 hours. No results for input(s): TROPIPOC in the last 168 hours.   BNPNo results for input(s): BNP, PROBNP in the last 168 hours.   DDimer No results for input(s): DDIMER in the last 168 hours.   Radiology    CT KNEE LEFT W CONTRAST  Result Date: 07/23/2019 CLINICAL DATA:  Progressive right knee and leg pain and swelling and joint effusion. Radiographs dated 07/12/2019 previous total knee replacement. Septic left total knee replacement. Irrigation and debridement with poly exchange on 07/13/2019 EXAM: CT OF THE RIGHT KNEE WITH CONTRAST TECHNIQUE: Multidetector CT imaging was performed following the standard protocol during bolus administration of intravenous contrast. CONTRAST:  142mL OMNIPAQUE IOHEXOL 300 MG/ML  SOLN COMPARISON:  RADIOGRAPHS DATED 07/12/2019 FINDINGS: Bones/Joint/Cartilage The components of the total knee prosthesis appear in good position with no  evidence of osteomyelitis or prosthetic component loosening. There is a moderate joint effusion. In addition, there is a prominent Baker's cyst measuring 8.5 x 4.1 x 2.1 cm. With some enhancement of the rim of the cyst. Muscles and Tendons Negative. Soft tissues There is a prominent soft tissue fluid collection anterior to the knee joint anterior to the quadriceps tendon and muscles and anterior to the patella. This could represent seroma or an extensive subcutaneous abscess. This fluid collection extends 2/3 of the circumference of knee, best seen on image 91 of series 5. IMPRESSION: 1. Large subcutaneous fluid collection at the anterior and medial and lateral aspects of the knee which could represent abscess or seroma or hematoma. 2. Moderate joint effusion or pus in the joint. 3. Prominent Baker's cyst which could contain  joint fluid or pus. 4. No osteomyelitis or other acute bone abnormality. Electronically Signed   By: Lorriane Shire M.D.   On: 07/23/2019 14:16    Cardiac Studies   2D echo 06/2019 IMPRESSIONS   1. Left ventricular ejection fraction, by visual estimation, is 55 to 60%. The left ventricle has normal function. There is mildly increased left ventricular hypertrophy. 2. Left ventricular diastolic function could not be evaluated. 3. Right ventricular volume/pressure overload. 4. The left ventricle demonstrates regional wall motion abnormalities. 5. Global right ventricle has severely reduced systolic function.The right ventricular size is severely enlarged. No increase in right ventricular wall thickness. 6. Left atrial size was normal. 7. Right atrial size was mild-moderately dilated. 8. Presence of pericardial fat pad. 9. Trivial pericardial effusion is present. 10. Mild mitral annular calcification. 11. The mitral valve is degenerative. Trivial mitral valve regurgitation. 12. The tricuspid valve is grossly normal. Tricuspid valve regurgitation is trivial. 13. The  aortic valve is tricuspid. Aortic valve regurgitation is not visualized. No evidence of aortic valve sclerosis or stenosis. 14. The pulmonic valve was grossly normal. Pulmonic valve regurgitation is not visualized. 15. TR signal is inadequate for assessing pulmonary artery systolic pressure. 16. The inferior vena cava is normal in size with greater than 50% respiratory variability, suggesting right atrial pressure of 3 mmHg. 17. No prior Echocardiogram.  Patient Profile     58 y.o. male with recent septic knee (prosthetic joint infection), MRSA bacteremia who is being followed for post-op afib with RVR  Assessment & Plan    ATRIAL FIB:   Currently not a candidate for any cardiology procedures at this time.  He remains quite confused.  Currently on maximal doses of both Toprol-XL and diltiazem.  I do think that as he improves from  his current issues and his delirium improves, his rate control Eamon Tantillo be better and we can, at that point, discuss further control issues for his atrial fibrillation.  Would allow him to be tachycardic at this time.  Rate and rhythm control continue to be difficult.  HTN: Overall well controlled throughout the day.  For questions or updates, please contact [place] Please consult [email or web address] for contact info under Cardiology/STEMI.   Signed, Braden Cimo Meredith Leeds, MD  07/24/2019, 9:27 AM

## 2019-07-24 NOTE — Progress Notes (Signed)
PROGRESS NOTE  Vernon Frye [id number] DOB: July 07, 1961 DOA: 07/12/2019 PCP: Vernon Rough, PA-C  Brief History   58 year old man PMH atrial fibrillation not on Eliquis secondary to inability to play, left total knee arthroplasty 2016, presented 12/15 2020 with left knee pain and swelling girlfriend has atrial fibrillation with rapid ventricular response, sepsis secondary to right knee infection.  Underwent knee aspiration 12/16 with removal of purulent fluid and was taken to the OR same day for septic left total knee replacement.  Course complicated by toxic metabolic encephalopathy requiring Precedex infusion, MRSA bacteremia secondary to septic right knee joint.  A & P  MRSA bacteremia secondary septic right knee joint, status post washout and antibiotic spacer placement.  Status post operative intervention 12/16. --TEE planned 12/28. --6 weeks of IV antibiotics per infectious disease. --CT yesterday showed large effusion, unclear purulence or hematoma.  Dr. Lonny Frye discussed with orthopedics who advised leg elevation, TED hose and assessment in a.m.  Postoperative atrial fibrillation with rapid ventricular response, may difficulty controlled by agitation.  Nonadherent with Eliquis as an outpatient.  Declined TEE/cardioversion 12/24. --Continue rate control as per cardiology  Acute toxic metabolic encephalopathy, thought secondary to sepsis, concern for polysubstance abuse, alcohol abuse, withdrawal.  Treated with Precedex in the ICU.  --No significant improvement over the last 72 hours.  On scheduled Seroquel, lorazepam as needed --Continue vitamin supplementation --Continue treatment for agitation as needed --CT head no acute abnormalities  Code Status: Full Family Communication: none Disposition Plan: home    Vernon Hodgkins, MD  [facility] Direct contact: see [email or web address] (further directions at bottom of note if needed) 7PM-7AM contact night coverage as at bottom of  note 07/24/2019, 2:22 PM  LOS: 11 days   Significant Hospital Events   .    Consults:  .    Procedures:  .   Significant Diagnostic Tests:  [name] Kitchen    Micro Data:  .    Antimicrobials:  .   Interval History/Subjective  Not able to offer history  Objective   Vitals:  Vitals:   07/23/19 2341 07/24/19 0405  BP: (!) 145/103 (!) 134/105  Pulse: (!) 108 (!) 119  Resp: 20 20  Temp: 99.5 F (37.5 C) 98.8 F (37.1 C)  SpO2: 100% 98%    Exam:  Constitutional/psychiatric: Appears encephalopathic, confused, anxious, irritable, not following commands or participating in exam.  Appears ill but nontoxic. Respiratory: Clear to auscultation bilaterally.  No wheezes, rales or rhonchi.  Normal respiratory effort. Cardiovascular.  Regular rate and rhythm.  No murmur, rub or gallop.  No right lower extremity edema.  2+ left lower extremity edema below the knee.  Perfusion appears intact at the foot. Musculoskeletal: Left knee appears edematous.  Dressing in place.  I have personally reviewed the following:   Today's Data  . CT head no acute abnormalities . BMP unremarkable . WBC elevated at 18.5.  Hemoglobin stable at 10.0.  Scheduled Meds: . diltiazem  90 mg Oral Q6H  . docusate sodium  100 mg Oral BID  . folic acid  1 mg Oral Daily  . lisinopril  10 mg Oral Daily  . metoprolol succinate  200 mg Oral Daily  . multivitamin with minerals  1 tablet Oral Daily  . QUEtiapine  25 mg Oral BID  . thiamine  100 mg Oral Daily   Or  . thiamine  100 mg Intravenous Daily   Continuous Infusions: . sodium chloride 50 mL/hr at 07/24/19 0600  . sodium chloride Stopped (07/22/19  0158)  . magnesium sulfate bolus IVPB    . vancomycin 1,500 mg (07/24/19 1406)    Principal Problem:   MRSA bacteremia Active Problems:   Septic joint (HCC)   Atrial fibrillation with rapid ventricular response (HCC)   AKI (acute kidney injury) ([place])   Hyponatremia   Severe sepsis (HCC)   Severe sepsis with  acute organ dysfunction (HCC)   Respiratory insufficiency   Acute pain of left knee   Prosthetic joint infection (HCC)   Tachypnea   Acute bilateral low back pain without sciatica   Left arm swelling   Red man syndrome   Acute metabolic encephalopathy   LOS: 11 days   How to contact the [facility] Attending or Consulting provider [place] or covering provider during after hours [place], for this patient?  1. Check the care team in [facility] and look for a) attending/consulting TRH provider listed and b) the [facility] team listed 2. Log into [email or web address] and use [name]'s universal password to access. If you do not have the password, please contact the hospital operator. 3. Locate the [facility] provider you are looking for under [facility] and page to a number that you can be directly reached. 4. If you still have difficulty reaching the provider, please page the [facility] (Director on Call) for the Hospitalists listed on amion for assistance.

## 2019-07-24 NOTE — Significant Event (Signed)
Rapid Response Event Note  Overview: Called d/t MEWS-6 for RR-28 , HR-156(Afib) , and LOC-responds to voice. This is not an acute change. Please call RRT if assistance needed.     Vernon Frye

## 2019-07-25 ENCOUNTER — Encounter (HOSPITAL_COMMUNITY): Payer: Self-pay | Admitting: Certified Registered"

## 2019-07-25 ENCOUNTER — Inpatient Hospital Stay (HOSPITAL_COMMUNITY): Payer: Self-pay

## 2019-07-25 ENCOUNTER — Encounter (HOSPITAL_COMMUNITY)
Admission: EM | Disposition: A | Discharge status: Home Health | Payer: Self-pay | Source: Home / Self Care | Attending: Internal Medicine

## 2019-07-25 DIAGNOSIS — R6520 Severe sepsis without septic shock: Secondary | ICD-10-CM

## 2019-07-25 DIAGNOSIS — A419 Sepsis, unspecified organism: Secondary | ICD-10-CM

## 2019-07-25 LAB — BASIC METABOLIC PANEL
Anion gap: 13 (ref 5–15)
BUN: 13 mg/dL (ref 6–20)
CO2: 20 mmol/L — ABNORMAL LOW (ref 22–32)
Calcium: 7.8 mg/dL — ABNORMAL LOW (ref 8.9–10.3)
Chloride: 112 mmol/L — ABNORMAL HIGH (ref 98–111)
Creatinine, Ser: 0.73 mg/dL (ref 0.61–1.24)
GFR calc Af Amer: >60 mL/min (ref >60)
GFR calc non Af Amer: >60 mL/min (ref >60)
Glucose, Bld: 144 mg/dL — ABNORMAL HIGH (ref 70–99)
Potassium: 3.7 mmol/L (ref 3.5–5.1)
Sodium: 145 mmol/L (ref 135–145)

## 2019-07-25 LAB — CBC
HCT: 33.1 % — ABNORMAL LOW (ref 39.0–52.0)
Hemoglobin: 10.5 g/dL — ABNORMAL LOW (ref 13.0–17.0)
MCH: 29.4 pg (ref 26.0–34.0)
MCHC: 31.7 g/dL (ref 30.0–36.0)
MCV: 92.7 fL (ref 80.0–100.0)
Platelets: 483 10*3/uL — ABNORMAL HIGH (ref 150–400)
RBC: 3.57 MIL/uL — ABNORMAL LOW (ref 4.22–5.81)
RDW: 15.4 % (ref 11.5–15.5)
WBC: 17.7 10*3/uL — ABNORMAL HIGH (ref 4.0–10.5)
nRBC: 0 % (ref 0.0–0.2)

## 2019-07-25 LAB — VANCOMYCIN, TROUGH: Vancomycin Tr: 17 ug/mL (ref 15–20)

## 2019-07-25 LAB — GLUCOSE, CAPILLARY
Glucose-Capillary: 144 mg/dL — ABNORMAL HIGH (ref 70–99)
Glucose-Capillary: 153 mg/dL — ABNORMAL HIGH (ref 70–99)
Glucose-Capillary: 159 mg/dL — ABNORMAL HIGH (ref 70–99)

## 2019-07-25 LAB — MAGNESIUM: Magnesium: 1.6 mg/dL — ABNORMAL LOW (ref 1.7–2.4)

## 2019-07-25 LAB — PHOSPHORUS: Phosphorus: 2.4 mg/dL — ABNORMAL LOW (ref 2.5–4.6)

## 2019-07-25 LAB — VANCOMYCIN, PEAK: Vancomycin Pk: 28 ug/mL — ABNORMAL LOW (ref 30–40)

## 2019-07-25 SURGERY — CANCELLED PROCEDURE

## 2019-07-25 MED ORDER — SODIUM CHLORIDE 0.9 % IV SOLN: INTRAVENOUS | Status: DC

## 2019-07-25 MED ORDER — INSULIN ASPART 100 UNIT/ML ~~LOC~~ SOLN
0.0000 [IU] | Freq: Three times a day (TID) | SUBCUTANEOUS | Status: DC
  Administered 2019-07-28 – 2019-07-30 (×5): 1 [IU] via SUBCUTANEOUS

## 2019-07-25 MED ORDER — POTASSIUM PHOSPHATES 15 MMOLE/5ML IV SOLN
20.0000 mmol | Freq: Once | INTRAVENOUS | Status: AC
  Administered 2019-07-25: 20 mmol via INTRAVENOUS
  Filled 2019-07-25: qty 6.67

## 2019-07-25 MED ORDER — MAGNESIUM SULFATE 2 GM/50ML IV SOLN
2.0000 g | Freq: Once | INTRAVENOUS | Status: AC
  Administered 2019-07-25: 2 g via INTRAVENOUS
  Filled 2019-07-25: qty 50

## 2019-07-25 MED ORDER — INSULIN ASPART 100 UNIT/ML ~~LOC~~ SOLN: 0.0000 [IU] | Freq: Every day | SUBCUTANEOUS | Status: DC

## 2019-07-25 MED ORDER — VANCOMYCIN HCL 1250 MG/250ML IV SOLN
1250.0000 mg | Freq: Two times a day (BID) | INTRAVENOUS | Status: DC
  Administered 2019-07-25 – 2019-08-15 (×43): 1250 mg via INTRAVENOUS
  Filled 2019-07-25 (×45): qty 250

## 2019-07-25 NOTE — Progress Notes (Signed)
Occupational Therapy Treatment Patient Details Name: Vernon Frye MRN: [id number] DOB: 07-25-1961 Today's Date: 07/25/2019    History of present illness Pt is a 58 y.o. male admitted 07/12/19 with septic L TKA. S/p I&D and poly exchange on 12/16. Course complicated by AMS/agitation, possible withdrawal. Pt also with c/o back pain; lumbar MRI negative for osteomyelitis or discitis, multilevel degenerative disc disease without neural impingement. PMH includes L TKA (2016), HTN, afib, DDD, anxiety, L shoulder sx.   OT comments  Pt making minimal progress towards OT goals this session. Session focus on sitting balance as precursor to higher level ADLs. Pt unable to follow commands throughout session requiring total A +2 to transition from supine> EOB. Pt able to sit EOB ~ 2 minutes with total A +2 before clearly stating "lay me back down." Returned pt to supine in similar fashion. Pt continues to be limited by pain, cognitive deficits and decreased activity tolerance. DC plan remains appropriate, will continue to follow acutely per POC.     Follow Up Recommendations  SNF;Supervision/Assistance - 24 hour    Equipment Recommendations  Other (comment)(defer to next venue of care)    Recommendations for Other Services      Precautions / Restrictions Precautions Precautions: Fall;Knee Required Braces or Orthoses: Knee Immobilizer - Left Knee Immobilizer - Left: Other (comment)(present in room, no specific orders) Restrictions Weight Bearing Restrictions: Yes LLE Weight Bearing: Weight bearing as tolerated       Mobility Bed Mobility Overal bed mobility: Needs Assistance Bed Mobility: Supine to Sit;Sit to Supine     Supine to sit: Total assist;+2 for physical assistance Sit to supine: Total assist;+2 for physical assistance   General bed mobility comments: heavy totalAx2 for all mobility, once at EOB with strong posterior lean requiring totalA to maintain upright but was able to  clearly state "lay back down" after sitting at EOB for approximately 2 minutes  Transfers                 General transfer comment: deferred    Balance Overall balance assessment: Needs assistance Sitting-balance support: Bilateral upper extremity supported;Feet supported Sitting balance-Leahy Scale: Zero Sitting balance - Comments: sat EOB approximately 2 minutes with totalAx1-2 Postural control: Posterior lean                                 ADL either performed or assessed with clinical judgement   ADL Overall ADL's : Needs assistance/impaired                                       General ADL Comments: session focus on sitting balance, unable to complete any functional ADLs EOB     Vision Patient Visual Report: No change from baseline     Perception     Praxis      Cognition Arousal/Alertness: Awake/alert Behavior During Therapy: Flat affect;Anxious Overall Cognitive Status: Impaired/Different from baseline Area of Impairment: Orientation;Attention;Memory;Following commands;Safety/judgement;Awareness;Problem solving                 Orientation Level: Disoriented to;Place;Time;Situation Current Attention Level: Focused Memory: Decreased short-term memory;Decreased recall of precautions Following Commands: Follows one step commands inconsistently Safety/Judgement: Decreased awareness of safety;Decreased awareness of deficits Awareness: Intellectual Problem Solving: Slow processing;Decreased initiation;Difficulty sequencing;Requires verbal cues;Requires tactile cues General Comments: unintelligible speech throughout session, not following commands or  contributing to session purposefully        Exercises     Shoulder Instructions       General Comments present in room, no specific orders    Pertinent Vitals/ Pain       Pain Assessment: Faces Faces Pain Scale: Hurts even more Pain Location: generalized pain Pain  Descriptors / Indicators: Grimacing;Guarding Pain Intervention(s): Limited activity within patient's tolerance;Repositioned;Monitored during session  Home Living                                          Prior Functioning/Environment              Frequency  Min 2X/week        Progress Toward Goals  OT Goals(current goals can now be found in the care plan section)  Progress towards OT goals: Not progressing toward goals - comment(not following commands or contributing purposefully in session)  Acute Rehab OT Goals Patient Stated Goal: Now agreeable to post-acute rehab OT Goal Formulation: Patient unable to participate in goal setting Time For Goal Achievement: 07/30/19 Potential to Achieve Goals: [place] Discharge plan remains appropriate    Co-evaluation    PT/OT/SLP Co-Evaluation/Treatment: Yes Reason for Co-Treatment: For patient/therapist safety;To address functional/ADL transfers;Necessary to address cognition/behavior during functional activity PT goals addressed during session: Mobility/safety with mobility;Balance OT goals addressed during session: ADL's and self-care      AM-PAC OT "6 Clicks" Daily Activity     Outcome Measure   Help from another person eating meals?: Total Help from another person taking care of personal grooming?: Total Help from another person toileting, which includes using toliet, bedpan, or urinal?: Total Help from another person bathing (including washing, rinsing, drying)?: Total Help from another person to put on and taking off regular upper body clothing?: Total Help from another person to put on and taking off regular lower body clothing?: Total 6 Click Score: 6    End of Session    OT Visit Diagnosis: Other abnormalities of gait and mobility (R26.89);Muscle weakness (generalized) (M62.81);Pain;Other symptoms and signs involving cognitive function Pain - Right/Left: Left   Activity Tolerance Patient  limited by lethargy;Patient limited by pain   Patient Left in bed;with call bell/phone within reach;with bed alarm set;with nursing/sitter in room   Nurse Communication          Time: 1205-1223 OT Time Calculation (min): 18 min  Charges: OT General Charges $OT Visit: 1 Visit  Lanier Clam., COTA/L Acute Rehabilitation Services [phone] [place] 07/25/2019, 2:57 PM

## 2019-07-25 NOTE — Progress Notes (Signed)
Patient ID: Vernon Frye, male   DOB: 10-May-1961, 58 y.o.   MRN: [id number] Subjective: 12 Days Post-Op Procedure(s) (LRB): IRRIGATION AND DEBRIDEMENT TOTAL  KNEE WITH POLY EXCHANGE (Left)   I went to see the patient today with the intention of discussing resection of his left total knee replacement.  However when I arrived he was very somnolent breathing through his mouth.  He would not respond and answer questions.  I spoke to the nurse regarding medication use.  He apparently is off alcohol withdrawal protocol and using Ativan as needed for agitation.  She reports that he was able to take some oral medication yesterday but not on a regular basis.  Phlebotomy was in drawing labs this morning.  Vancomycin is running through his IV. Nursing comments that there is some persistent drainage from his left knee but not excessive.  They are dressing the knee. The nurse that was with him last night did contact rapid response to have him evaluated.  Apparently no further work-up was performed.  They did not feel that he was unstable enough to require a higher level of care.  Objective:   VITALS:   Vitals:   07/25/19 0543 07/25/19 0733  BP:    Pulse: (!) 130   Resp: (!) 30 (!) 27  Temp: 99.3 F (37.4 C) 97.8 F (36.6 C)  SpO2: 98%    He is unarousable this morning.  He is breathing predominantly out of his mouth at a fairly rapid rate. Left knee exam: Dry dressing applied over his knee incision currently Bilateral lower extremity edema with some reduction in edema evidenced by skin wrinkling distally. No erythematous changes in his lower extremities particularly left side.  LABS Recent Labs    07/23/19 0535 07/24/19 0404  HGB 8.5* 10.0*  HCT 26.4* 31.3*  WBC 14.8* 18.5*  PLT PLATELET CLUMPS NOTED ON SMEAR, UNABLE TO ESTIMATE 423*    Recent Labs    07/23/19 1623 07/24/19 0404  NA 139 141  K 3.2* 3.7  BUN 13 12  CREATININE 0.72 0.76  GLUCOSE 152* 145*    No results for  input(s): LABPT, INR in the last 72 hours.   Assessment/Plan: 12 Days Post-Op Procedure(s) (LRB): IRRIGATION AND DEBRIDEMENT TOTAL  KNEE WITH POLY EXCHANGE (Left)  Plan: At this point this represents a significant challenge.  Apparently any family contact has been extremely challenging.  In order to resect his knee which would represent a significant procedure with implications it would be very important to have a conversation with the patient or at least a relative that is aware of the situation. I think it is very important that he is evaluated from a medical standpoint.  I am not certain if his current state it is medication induced or if this is related to medical challenges associated with his underlying condition or persistent sepsis. I will try to get in touch with the hospitalist today to discuss his medical condition and plans.  If we deem that it is a medical emergency to resect his knee then this decision can be made with the treatment team.

## 2019-07-25 NOTE — Progress Notes (Signed)
Patient ID: Vernon Frye, male   DOB: 30-Jun-1961, 58 y.o.   MRN: [id number]   After discussing this case with Dr. Sarajane Jews reviewing the pertinent medical concerns regarding management of this left total knee and the social issues with no medical POA we feel it is medically necessary at this point to proceed with surgery.  Given the constellation of medical issues it is still not clear that there is not persistent infection preventing him from improvement. There are other issues that may contribute to this however by removing his knee and placing an antibiotic spacer we will have the best chance of treating the knee infection.  Orders placed NPO after MN OR tomorrow around 1:30  Due to his persistent disorientation consent will obtained as medical urgency as reviewed with risk management

## 2019-07-25 NOTE — Progress Notes (Signed)
Pt's mother's phone number was updated.  Idolina Primer, RN

## 2019-07-25 NOTE — Significant Event (Addendum)
Rapid Response Event Note  Overview:Asked to come see pt as a second set of eyes. Pt here with septic L knee s/p washout and abx spacer placement on [id number] complicated by encephalopathy/Afib with RVR/redman's syndrome. Pt MEWS-6 for HR-132, RR-26, LOC-responds to voice.  Time Called: 0400 Arrival Time: 0405 Event Type: Neurologic, Respiratory, Cardiac(second set of eyes)  Initial Focused Assessment: Pt laying in bed with arms in air, attempting to grab something. Pt is alert to person and year. Pt c/o pain "everywhere." Pt is mouth-breathing, mouth is very dry and there is a pill on tongue. Pill removed from tongue by bedside RN. Pupils are 6s and brisk. Pt moves all extremities and has no focal deficits, no facial droop. Pt will follow simple commands when asked multiple times.  Skin is warm and dry, Lungs are clear, diminished in bases. LLE swollen and has dressing on knee. HR-130s(Afib), RR-22-28, SpO2-100% on RA.   Interventions: No RRT interventions Plan of Care (if not transferred): Pt seems delirious-this has been charted extensively. His mental status waxes and wanes. He is tachypneic and tachycardic. Pt wouldn't take scheduled cardizem 90mg  or toprol-xl 200mg  on day shift. Night shift RN was able to give him these meds tonight but unclear which med was partially dissolved on tongue. Pt also wouldn't take seroquel 25mg  on day shift or night shift. I think missing these meds could be contributing to his tachycardia and delirium, as his sepsis and polysubstance hx may be as well. Pt's SpO2 on RA is 100% and lungs are clear. I think his tachpnea is most likely driven by his agitation. I recommend treating his agitation with ativan/haldol, giving his scheduled meds to him if can safely do so, and continuing to closely monitor him. Please call RRT if any further assistance needed.      Dillard Essex
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
## 2019-07-25 NOTE — Progress Notes (Signed)
Progress Note  Patient Name: Vernon Frye Date of Encounter: 07/25/2019  Primary Cardiologist:   Sinclair Grooms, MD   Subjective   Continued agitation and confusion.  Receiving Ativan.  He now has IV access.  Inpatient Medications    Scheduled Meds: . diltiazem  90 mg Oral Q6H  . docusate sodium  100 mg Oral BID  . folic acid  1 mg Oral Daily  . lisinopril  10 mg Oral Daily  . metoprolol succinate  200 mg Oral Daily  . multivitamin with minerals  1 tablet Oral Daily  . QUEtiapine  25 mg Oral BID  . thiamine  100 mg Oral Daily   Or  . thiamine  100 mg Intravenous Daily   Continuous Infusions: . sodium chloride 50 mL/hr at 07/25/19 0319  . sodium chloride Stopped (07/22/19 0158)  . magnesium sulfate bolus IVPB    . vancomycin 1,500 mg (07/25/19 0322)   PRN Meds: diphenhydrAMINE, haloperidol lactate, LORazepam, menthol-cetylpyridinium **OR** phenol, metoprolol tartrate, ondansetron **OR** ondansetron (ZOFRAN) IV, oxyCODONE, Resource ThickenUp Clear, sodium chloride flush   Vital Signs    Vitals:   07/25/19 0540 07/25/19 0543 07/25/19 0733 07/25/19 0836  BP: (!) 130/94   (!) 193/105  Pulse: (!) 138 (!) 130    Resp: (!) 40 (!) 30 (!) 27   Temp:  99.3 F (37.4 C) 97.8 F (36.6 C)   TempSrc:  Axillary Oral   SpO2: 100% 98%    Weight:  103 kg    Height:        Intake/Output Summary (Last 24 hours) at 07/25/2019 0910 Last data filed at 07/25/2019 0500 Gross per 24 hour  Intake 999.71 ml  Output 900 ml  Net 99.71 ml   Filed Weights   07/23/19 0426 07/24/19 0405 07/25/19 0543  Weight: 107 kg 106.1 kg 103 kg    Telemetry    Atrial fibrillation with rapid rates- Personally Reviewed  ECG    NA - Personally Reviewed  Physical Exam   GEN: Well nourished, well developed, in no acute distress  HEENT: normal  Neck: no JVD, carotid bruits, or masses Cardiac: Irregular, tachycardic; no murmurs, rubs, or gallops,no edema  Respiratory:  clear to  auscultation bilaterally, normal work of breathing GI: soft, nontender, nondistended, + BS MS: no deformity or atrophy  Skin: warm and dry Neuro:  Strength and sensation are intact Psych: Confused     Labs    Chemistry Recent Labs  Lab 07/21/19 0350 07/23/19 1623 07/24/19 0404  NA 138 139 141  K 4.3 3.2* 3.7  CL 107 107 110  CO2 18* 23 22  GLUCOSE 183* 152* 145*  BUN 20 13 12   CREATININE 0.82 0.72 0.76  CALCIUM 7.3* 7.7* 7.7*  PROT  --  6.3*  --   ALBUMIN  --  2.1*  --   AST  --  69*  --   ALT  --  96*  --   ALKPHOS  --  102  --   BILITOT  --  1.7*  --   GFRNONAA >60 >60 >60  GFRAA >60 >60 >60  ANIONGAP 13 9 9      Hematology Recent Labs  Lab 07/23/19 0535 07/24/19 0404 07/25/19 0653  WBC 14.8* 18.5* 17.7*  RBC 2.84* 3.44* 3.57*  HGB 8.5* 10.0* 10.5*  HCT 26.4* 31.3* 33.1*  MCV 93.0 91.0 92.7  MCH 29.9 29.1 29.4  MCHC 32.2 31.9 31.7  RDW 15.2 15.2 15.4  PLT PLATELET  CLUMPS NOTED ON SMEAR, UNABLE TO ESTIMATE 423* 483*    Cardiac EnzymesNo results for input(s): TROPONINI in the last 168 hours. No results for input(s): TROPIPOC in the last 168 hours.   BNPNo results for input(s): BNP, PROBNP in the last 168 hours.   DDimer No results for input(s): DDIMER in the last 168 hours.   Radiology    CT HEAD WO CONTRAST  Result Date: 07/24/2019 CLINICAL DATA:  58 year old male with unexplained altered mental status. Recent sepsis. EXAM: CT HEAD WITHOUT CONTRAST TECHNIQUE: Contiguous axial images were obtained from the base of the skull through the vertex without intravenous contrast. COMPARISON:  Brain MRI 09/16/2018. 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 Cerebral volume is stable and within normal limits. No midline shift, ventriculomegaly, mass effect, evidence of mass lesion, intracranial hemorrhage or evidence of cortically based acute  infarction. Gray-white matter differentiation within normal limits. Vascular: Calcified atherosclerosis at the skull base. No suspicious intracranial vascular hyperdensity. Skull: No acute osseous abnormality identified. Sinuses/Orbits: Minimal bubbly opacity in the left sphenoid sinus but other visualized paranasal sinuses and mastoids are clear. Other: Visualized orbits and scalp soft tissues are within normal limits. IMPRESSION: No acute intracranial abnormality. Non contrast CT appearance of the brain within normal limits for age. 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 Electronically Signed   By: Genevie Ann M.D.   On: 07/24/2019 12:29   CT KNEE LEFT W CONTRAST  Result Date: 07/23/2019 CLINICAL DATA:  Progressive right knee and leg pain and swelling and joint effusion. Radiographs dated 07/12/2019 previous total knee replacement. Septic left total knee replacement. Irrigation and debridement with poly exchange on 07/13/2019 EXAM: CT OF THE RIGHT KNEE WITH CONTRAST TECHNIQUE: Multidetector CT imaging was performed following the standard protocol during bolus administration of intravenous contrast. CONTRAST:  157mL OMNIPAQUE IOHEXOL 300 MG/ML  SOLN COMPARISON:  RADIOGRAPHS DATED 07/12/2019 FINDINGS: Bones/Joint/Cartilage The components of the total knee prosthesis appear in good position with no evidence of osteomyelitis or prosthetic component loosening. There is a moderate joint effusion. In addition, there is a prominent Baker's cyst measuring 8.5 x 4.1 x 2.1 cm. With some enhancement of the rim of the cyst. Muscles and Tendons Negative. Soft tissues There is a prominent soft tissue fluid collection anterior to the knee joint anterior to the quadriceps tendon and muscles and anterior to the patella. This could represent seroma or an extensive subcutaneous abscess. This fluid collection extends 2/3 of the circumference of knee, best seen on image 91 of series 5. IMPRESSION:  1. Large subcutaneous fluid collection at the anterior and medial and lateral aspects of the knee which could represent abscess or seroma or hematoma. 2. Moderate joint effusion or pus in the joint. 3. Prominent Baker's cyst which could contain  joint fluid or pus. 4. No osteomyelitis or other acute bone abnormality. Electronically Signed   By: Lorriane Shire M.D.   On: 07/23/2019 14:16    Cardiac Studies   2D echo 06/2019 IMPRESSIONS   1. Left ventricular ejection fraction, by visual estimation, is 55 to 60%. The left ventricle has normal function. There is mildly increased left ventricular hypertrophy. 2. Left ventricular diastolic function could not be evaluated. 3. Right ventricular volume/pressure overload. 4. The left ventricle demonstrates regional wall motion abnormalities. 5. Global right ventricle has severely reduced systolic function.The right ventricular size is severely enlarged. No increase in right ventricular wall thickness. 6. Left  atrial size was normal. 7. Right atrial size was mild-moderately dilated. 8. Presence of pericardial fat pad. 9. Trivial pericardial effusion is present. 10. Mild mitral annular calcification. 11. The mitral valve is degenerative. Trivial mitral valve regurgitation. 12. The tricuspid valve is grossly normal. Tricuspid valve regurgitation is trivial. 13. The aortic valve is tricuspid. Aortic valve regurgitation is not visualized. No evidence of aortic valve sclerosis or stenosis. 14. The pulmonic valve was grossly normal. Pulmonic valve regurgitation is not visualized. 15. TR signal is inadequate for assessing pulmonary artery systolic pressure. 16. The inferior vena cava is normal in size with greater than 50% respiratory variability, suggesting right atrial pressure of 3 mmHg. 17. No prior Echocardiogram.  Patient Profile     58 y.o. male with recent septic knee (prosthetic joint infection), MRSA bacteremia who is being followed  for post-op afib with RVR  Assessment & Plan    ATRIAL FIB:   Rate up due to delirium on max dose oral cardiazem and toprol can cover with iv lopressor for Hr > 150 bpm  DOAC held 07/22/19 due to bleeding from knee will continue to hold and observe    HTN: Overall well controlled throughout the day.  TKR:  On left on antibiotics with LE edema per ortho   For questions or updates, please contact [place] Please consult [email or web address] for contact info under Cardiology/STEMI.   Signed, Jenkins Rouge, MD  07/25/2019, 9:10 AM

## 2019-07-25 NOTE — Significant Event (Signed)
Rapid Response Rounding Note  Overview: Follow-up rounding on patient.   Time Called: 0905 Arrival Time: 0905  Initial Focused Assessment: Patient lying in bed. Appears calm and comfortable, in no distress. Patient BP 169/94, HR 143, SpO2 100%, RR 29.   Interventions: Primary RN had just administered scheduled Cardizem and Lopressor. Dr. Johnsie Cancel at bedside to assess patient.  Plan of Care (if not transferred): No needs from rapid response at this time. RN to call rapid response for future needs.   Event Summary: Name of Physician Notified: Dr. Johnsie Cancel at [phone] MD at bedside to assess patient.   Outcome: Stayed in room and stabalized  Event End Time: [place]

## 2019-07-25 NOTE — Progress Notes (Signed)
Physical Therapy Treatment Patient Details Name: Vernon Frye MRN: [id number] DOB: 07/07/1961 Today's Date: 07/25/2019    History of Present Illness Pt is a 58 y.o. male admitted 07/12/19 with septic L TKA. S/p I&D and poly exchange on 12/16. Course complicated by AMS/agitation, possible withdrawal. Pt also with c/o back pain; lumbar MRI negative for osteomyelitis or discitis, multilevel degenerative disc disease without neural impingement. PMH includes L TKA (2016), HTN, afib, DDD, anxiety, L shoulder sx.    PT Comments     Patient received in bed, A&O to self only and with mouth open, speech very mumbled and garbled and difficult to understand today. Required heavy totalAx2 for all bed mobility, once sitting up at EOB required heavy totalAx1-2 to maintain upright due to strong posterior lean; at one point was able to clearly state "lay back down" after sitting for approximately 2 minutes however. Unable to accurately assess processing or attention due to garbled speech and difficulty communicating today. HR in a-fib with RVR up to 133BPM at highest. He was left in bed with all needs met, nurse tech present and attending this morning. Continue to strongly and emphatically recommend SNF and 24/7A moving forward.   Follow Up Recommendations  SNF;Supervision/Assistance - 24 hour     Equipment Recommendations  (TBD)    Recommendations for Other Services       Precautions / Restrictions Precautions Precautions: Fall;Knee Required Braces or Orthoses: Knee Immobilizer - Left Knee Immobilizer - Left: Other (comment)(present in room, no specific orders) Restrictions Weight Bearing Restrictions: Yes LLE Weight Bearing: Weight bearing as tolerated    Mobility  Bed Mobility Overal bed mobility: Needs Assistance Bed Mobility: Supine to Sit;Sit to Supine     Supine to sit: Total assist;+2 for physical assistance Sit to supine: Total assist;+2 for physical assistance   General bed  mobility comments: heavy totalAx2 for all mobility, once at EOB with strong posterior lean requiring totalA to maintain upright but was able to clearly state "lay back down" after sitting at EOB for approximately 2 minutes  Transfers                 General transfer comment: deferred  Ambulation/Gait             General Gait Details: deferred   Stairs             Wheelchair Mobility    Modified Rankin (Stroke Patients Only)       Balance Overall balance assessment: Needs assistance Sitting-balance support: Bilateral upper extremity supported;Feet supported Sitting balance-Leahy Scale: Zero Sitting balance - Comments: sat EOB approximately 2 minutes with totalAx1-2 Postural control: Posterior lean                                  Cognition Arousal/Alertness: Awake/alert Behavior During Therapy: Flat affect;Anxious Overall Cognitive Status: Impaired/Different from baseline Area of Impairment: Orientation;Attention;Memory;Following commands;Safety/judgement;Awareness;Problem solving                 Orientation Level: Disoriented to;Place;Time;Situation Current Attention Level: Focused Memory: Decreased short-term memory;Decreased recall of precautions Following Commands: Follows one step commands inconsistently Safety/Judgement: Decreased awareness of safety;Decreased awareness of deficits Awareness: Intellectual Problem Solving: Slow processing;Decreased initiation;Difficulty sequencing;Requires verbal cues;Requires tactile cues General Comments: most of speech quite garbled and difficult to understand, not following commands today, not able to really understand his speech enough to truly assess attention and processing this session  Exercises      General Comments General comments (skin integrity, edema, etc.): in A-fib with RVR as high as 133BPM      Pertinent Vitals/Pain Pain Assessment: Faces Faces Pain Scale: Hurts  even more Pain Location: generalized pain Pain Descriptors / Indicators: Grimacing;Guarding Pain Intervention(s): Limited activity within patient's tolerance;Repositioned    Home Living                      Prior Function            PT Goals (current goals can now be found in the care plan section) Acute Rehab PT Goals Patient Stated Goal: Now agreeable to post-acute rehab PT Goal Formulation: With patient Time For Goal Achievement: 07/29/19 Potential to Achieve Goals: Fair Progress towards PT goals: Not progressing toward goals - comment(limited by cognition)    Frequency    Min 3X/week      PT Plan Current plan remains appropriate    Co-evaluation PT/OT/SLP Co-Evaluation/Treatment: Yes Reason for Co-Treatment: Complexity of the patient's impairments (multi-system involvement);Necessary to address cognition/behavior during functional activity;For patient/therapist safety;To address functional/ADL transfers PT goals addressed during session: Mobility/safety with mobility;Balance        AM-PAC PT "6 Clicks" Mobility   Outcome Measure  Help needed turning from your back to your side while in a flat bed without using bedrails?: Total Help needed moving from lying on your back to sitting on the side of a flat bed without using bedrails?: Total Help needed moving to and from a bed to a chair (including a wheelchair)?: Total Help needed standing up from a chair using your arms (e.g., wheelchair or bedside chair)?: Total Help needed to walk in hospital room?: Total Help needed climbing 3-5 steps with a railing? : Total 6 Click Score: 6    End of Session   Activity Tolerance: Patient limited by fatigue Patient left: in bed;with call bell/phone within reach;with bed alarm set;with nursing/sitter in room   PT Visit Diagnosis: Other abnormalities of gait and mobility (R26.89);Pain Pain - Right/Left: Left Pain - part of body: Knee     Time: 1208-1223 PT Time  Calculation (min) (ACUTE ONLY): 15 min  Charges:  $Therapeutic Activity: 8-22 mins                     Windell Norfolk, DPT, PN1   Supplemental Physical Therapist [place]    Pager [phone] Acute Rehab Office [phone]

## 2019-07-25 NOTE — Progress Notes (Signed)
Pharmacy Antibiotic Note  Vernon Frye is a 58 y.o. male admitted on 07/12/2019 with MRSA bacteremia and prosthetic joint infection.  Pharmacy has been consulted for vancomycin dosing.  Pt on extended infusion time (4hrs) to alleviate Red Man Syndrome; no adverse effects reported since this was begun.  Vanc peak 28, trough 17 >> AUC 517, above goal.  Plan: Change vancomycin to 1250 q 12hrs.  Calculated AUC 492.   Planned therapy through 08/15/19.  Height: 6\' 3"  (190.5 cm) Weight: 227 lb (103 kg) IBW/kg (Calculated) : 84.5  Temp (24hrs), Avg:99 F (37.2 C), Min:97.8 F (36.6 C), Max:99.5 F (37.5 C)  Recent Labs  Lab 07/20/19 0222 07/21/19 0350 07/21/19 0622 07/21/19 1834 07/22/19 0029 07/22/19 0106 07/23/19 0535 07/23/19 1623 07/24/19 0404 07/25/19 0653 07/25/19 0817 07/25/19 1307  WBC 21.1*  --  17.1*  --   --  18.7* 14.8*  --  18.5* 17.7*  --   --   CREATININE 0.93 0.82  --   --   --   --   --  0.72 0.76 0.73  --   --   VANCOTROUGH  --   --   --   --  11*  --   --   --   --   --   --  17  VANCOPEAK  --   --   --  18*  --   --   --   --   --   --  28*  --     Estimated Creatinine Clearance: 130.8 mL/min (by C-G formula based on SCr of 0.73 mg/dL).    Allergies  Allergen Reactions  . Vancomycin Shortness Of Breath and Rash    [name]     Thank you for allowing pharmacy to be a part of this patient's care.  [name], [facility] Clinical Pharmacist Phone [phone]  07/25/2019 2:49 PM

## 2019-07-26 ENCOUNTER — Inpatient Hospital Stay (HOSPITAL_COMMUNITY): Payer: Self-pay | Admitting: Anesthesiology

## 2019-07-26 ENCOUNTER — Encounter (HOSPITAL_COMMUNITY)
Admission: EM | Disposition: A | Discharge status: Home Health | Payer: Self-pay | Source: Home / Self Care | Attending: Internal Medicine

## 2019-07-26 ENCOUNTER — Encounter (HOSPITAL_COMMUNITY): Payer: Self-pay | Admitting: Pulmonary Disease

## 2019-07-26 ENCOUNTER — Inpatient Hospital Stay (HOSPITAL_COMMUNITY): Payer: Self-pay

## 2019-07-26 DIAGNOSIS — J95821 Acute postprocedural respiratory failure: Secondary | ICD-10-CM

## 2019-07-26 DIAGNOSIS — Z96659 Presence of unspecified artificial knee joint: Secondary | ICD-10-CM

## 2019-07-26 HISTORY — PX: TOTAL KNEE REVISION: SHX996

## 2019-07-26 LAB — CBC
HCT: 28.4 % — ABNORMAL LOW (ref 39.0–52.0)
Hemoglobin: 9.3 g/dL — ABNORMAL LOW (ref 13.0–17.0)
MCH: 29.8 pg (ref 26.0–34.0)
MCHC: 32.7 g/dL (ref 30.0–36.0)
MCV: 91 fL (ref 80.0–100.0)
Platelets: 365 10*3/uL (ref 150–400)
RBC: 3.12 MIL/uL — ABNORMAL LOW (ref 4.22–5.81)
RDW: 15.8 % — ABNORMAL HIGH (ref 11.5–15.5)
WBC: 10.7 10*3/uL — ABNORMAL HIGH (ref 4.0–10.5)
nRBC: 0 % (ref 0.0–0.2)

## 2019-07-26 LAB — BASIC METABOLIC PANEL
Anion gap: 9 (ref 5–15)
BUN: 15 mg/dL (ref 6–20)
CO2: 22 mmol/L (ref 22–32)
Calcium: 7.9 mg/dL — ABNORMAL LOW (ref 8.9–10.3)
Chloride: 113 mmol/L — ABNORMAL HIGH (ref 98–111)
Creatinine, Ser: 0.7 mg/dL (ref 0.61–1.24)
GFR calc Af Amer: >60 mL/min (ref >60)
GFR calc non Af Amer: >60 mL/min (ref >60)
Glucose, Bld: 144 mg/dL — ABNORMAL HIGH (ref 70–99)
Potassium: 3.3 mmol/L — ABNORMAL LOW (ref 3.5–5.1)
Sodium: 144 mmol/L (ref 135–145)

## 2019-07-26 LAB — POCT I-STAT 7, (LYTES, BLD GAS, ICA,H+H)
Acid-base deficit: 3 mmol/L — ABNORMAL HIGH (ref 0.0–2.0)
Bicarbonate: 21.2 mmol/L (ref 20.0–28.0)
Calcium, Ion: 1.15 mmol/L (ref 1.15–1.40)
HCT: 22 % — ABNORMAL LOW (ref 39.0–52.0)
Hemoglobin: 7.5 g/dL — ABNORMAL LOW (ref 13.0–17.0)
O2 Saturation: 100 %
Patient temperature: 98.2
Potassium: 3.7 mmol/L (ref 3.5–5.1)
Sodium: 145 mmol/L (ref 135–145)
TCO2: 22 mmol/L (ref 22–32)
pCO2 arterial: 33.8 mmHg (ref 32.0–48.0)
pH, Arterial: 7.404 (ref 7.350–7.450)
pO2, Arterial: 180 mmHg — ABNORMAL HIGH (ref 83.0–108.0)

## 2019-07-26 LAB — PHOSPHORUS: Phosphorus: 2.8 mg/dL (ref 2.5–4.6)

## 2019-07-26 LAB — GLUCOSE, CAPILLARY
Glucose-Capillary: 141 mg/dL — ABNORMAL HIGH (ref 70–99)
Glucose-Capillary: 138 mg/dL — ABNORMAL HIGH (ref 70–99)
Glucose-Capillary: 127 mg/dL — ABNORMAL HIGH (ref 70–99)
Glucose-Capillary: 128 mg/dL — ABNORMAL HIGH (ref 70–99)
Glucose-Capillary: 126 mg/dL — ABNORMAL HIGH (ref 70–99)
Glucose-Capillary: 125 mg/dL — ABNORMAL HIGH (ref 70–99)

## 2019-07-26 LAB — TRIGLYCERIDES: Triglycerides: 133 mg/dL (ref <150)

## 2019-07-26 LAB — MAGNESIUM: Magnesium: 1.6 mg/dL — ABNORMAL LOW (ref 1.7–2.4)

## 2019-07-26 SURGERY — TOTAL KNEE REVISION
Anesthesia: General | Site: Knee | Laterality: Left

## 2019-07-26 MED ORDER — MIDAZOLAM HCL 2 MG/2ML IJ SOLN
2.0000 mg | INTRAMUSCULAR | Status: DC | PRN
  Administered 2019-07-27 – 2019-07-28 (×2): 2 mg via INTRAVENOUS
  Filled 2019-07-26 (×2): qty 2

## 2019-07-26 MED ORDER — ROCURONIUM BROMIDE 10 MG/ML (PF) SYRINGE
PREFILLED_SYRINGE | INTRAVENOUS | Status: AC
  Filled 2019-07-26: qty 10

## 2019-07-26 MED ORDER — PROPOFOL 1000 MG/100ML IV EMUL
5.0000 ug/kg/min | INTRAVENOUS | Status: DC
  Administered 2019-07-26: 30 ug/kg/min via INTRAVENOUS
  Administered 2019-07-26: 18:00:00 5 ug/kg/min via INTRAVENOUS
  Administered 2019-07-27 (×2): 25 ug/kg/min via INTRAVENOUS
  Administered 2019-07-27 – 2019-07-29 (×4): 15 ug/kg/min via INTRAVENOUS
  Administered 2019-07-30 (×3): 30 ug/kg/min via INTRAVENOUS
  Filled 2019-07-26 (×5): qty 100
  Filled 2019-07-26: qty 300
  Filled 2019-07-26 (×5): qty 100

## 2019-07-26 MED ORDER — ROCURONIUM BROMIDE 100 MG/10ML IV SOLN
INTRAVENOUS | Status: DC | PRN
  Administered 2019-07-26 (×2): 50 mg via INTRAVENOUS

## 2019-07-26 MED ORDER — DEXMEDETOMIDINE HCL IN NACL 400 MCG/100ML IV SOLN
0.0000 ug/kg/h | INTRAVENOUS | Status: DC
  Administered 2019-07-26: 17:00:00 0.4 ug/kg/h via INTRAVENOUS
  Filled 2019-07-26: qty 100

## 2019-07-26 MED ORDER — ONDANSETRON HCL 4 MG PO TABS: 4.0000 mg | ORAL_TABLET | Freq: Four times a day (QID) | ORAL | Status: DC | PRN

## 2019-07-26 MED ORDER — TOBRAMYCIN SULFATE 1.2 G IJ SOLR
INTRAMUSCULAR | Status: AC
  Filled 2019-07-26: qty 3.6

## 2019-07-26 MED ORDER — ETOMIDATE 2 MG/ML IV SOLN
INTRAVENOUS | Status: DC | PRN
  Administered 2019-07-26: 20 mg via INTRAVENOUS

## 2019-07-26 MED ORDER — FENTANYL CITRATE (PF) 250 MCG/5ML IJ SOLN
INTRAMUSCULAR | Status: AC
  Filled 2019-07-26: qty 5

## 2019-07-26 MED ORDER — TRANEXAMIC ACID-NACL 1000-0.7 MG/100ML-% IV SOLN: 1000.0000 mg | INTRAVENOUS | Status: DC

## 2019-07-26 MED ORDER — PROPOFOL 500 MG/50ML IV EMUL
INTRAVENOUS | Status: DC | PRN
  Administered 2019-07-26: 50 ug/kg/min via INTRAVENOUS

## 2019-07-26 MED ORDER — AMIODARONE IV BOLUS ONLY 150 MG/100ML
150.0000 mg | Freq: Once | INTRAVENOUS | Status: AC
  Administered 2019-07-27: 150 mg via INTRAVENOUS
  Filled 2019-07-26: qty 100

## 2019-07-26 MED ORDER — ETOMIDATE 2 MG/ML IV SOLN
INTRAVENOUS | Status: AC
  Filled 2019-07-26: qty 10

## 2019-07-26 MED ORDER — ESMOLOL HCL 100 MG/10ML IV SOLN
INTRAVENOUS | Status: AC
  Filled 2019-07-26: qty 10

## 2019-07-26 MED ORDER — MIDAZOLAM HCL 2 MG/2ML IJ SOLN
2.0000 mg | INTRAMUSCULAR | Status: DC | PRN
  Administered 2019-07-26 – 2019-07-28 (×2): 2 mg via INTRAVENOUS
  Filled 2019-07-26 (×2): qty 2

## 2019-07-26 MED ORDER — DILTIAZEM HCL-DEXTROSE 125-5 MG/125ML-% IV SOLN (PREMIX)
5.0000 mg/h | INTRAVENOUS | Status: AC
  Administered 2019-07-26: 15 mg/h via INTRAVENOUS
  Filled 2019-07-26: qty 125

## 2019-07-26 MED ORDER — DOCUSATE SODIUM 50 MG/5ML PO LIQD: 100.0000 mg | Freq: Two times a day (BID) | ORAL | Status: DC | PRN

## 2019-07-26 MED ORDER — SUCCINYLCHOLINE CHLORIDE 200 MG/10ML IV SOSY
PREFILLED_SYRINGE | INTRAVENOUS | Status: AC
  Filled 2019-07-26: qty 10

## 2019-07-26 MED ORDER — MORPHINE SULFATE (PF) 2 MG/ML IV SOLN: 0.5000 mg | INTRAVENOUS | Status: DC | PRN

## 2019-07-26 MED ORDER — SUCCINYLCHOLINE CHLORIDE 20 MG/ML IJ SOLN
INTRAMUSCULAR | Status: DC | PRN
  Administered 2019-07-26: 120 mg via INTRAVENOUS

## 2019-07-26 MED ORDER — TOBRAMYCIN SULFATE 1.2 G IJ SOLR
INTRAMUSCULAR | Status: DC | PRN
  Administered 2019-07-26: 3.6 g

## 2019-07-26 MED ORDER — ONDANSETRON HCL 4 MG/2ML IJ SOLN
INTRAMUSCULAR | Status: AC
  Filled 2019-07-26: qty 2

## 2019-07-26 MED ORDER — DILTIAZEM HCL-DEXTROSE 125-5 MG/125ML-% IV SOLN (PREMIX)
5.0000 mg/h | INTRAVENOUS | Status: AC
  Administered 2019-07-26: 5 mg/h via INTRAVENOUS
  Filled 2019-07-26: qty 125

## 2019-07-26 MED ORDER — ESMOLOL HCL 100 MG/10ML IV SOLN
INTRAVENOUS | Status: DC | PRN
  Administered 2019-07-26: 50 ug via INTRAVENOUS
  Administered 2019-07-26: 30 ug via INTRAVENOUS

## 2019-07-26 MED ORDER — LIDOCAINE 2% (20 MG/ML) 5 ML SYRINGE
INTRAMUSCULAR | Status: AC
  Filled 2019-07-26: qty 5

## 2019-07-26 MED ORDER — DIPHENHYDRAMINE HCL 50 MG/ML IJ SOLN
INTRAMUSCULAR | Status: AC
  Filled 2019-07-26: qty 1

## 2019-07-26 MED ORDER — POTASSIUM CHLORIDE 10 MEQ/100ML IV SOLN
10.0000 meq | INTRAVENOUS | Status: AC
  Administered 2019-07-26 (×4): 10 meq via INTRAVENOUS
  Filled 2019-07-26 (×4): qty 100

## 2019-07-26 MED ORDER — CHLORHEXIDINE GLUCONATE 4 % EX LIQD
60.0000 mL | Freq: Once | CUTANEOUS | Status: AC
  Administered 2019-07-26: 4 via TOPICAL
  Filled 2019-07-26: qty 60

## 2019-07-26 MED ORDER — CHLORHEXIDINE GLUCONATE 0.12% ORAL RINSE (MEDLINE KIT)
15.0000 mL | Freq: Two times a day (BID) | OROMUCOSAL | Status: DC
  Administered 2019-07-26 – 2019-07-31 (×10): 15 mL via OROMUCOSAL

## 2019-07-26 MED ORDER — LIDOCAINE HCL (CARDIAC) PF 100 MG/5ML IV SOSY
PREFILLED_SYRINGE | INTRAVENOUS | Status: DC | PRN
  Administered 2019-07-26: 100 mg via INTRATRACHEAL

## 2019-07-26 MED ORDER — MIDAZOLAM HCL 2 MG/2ML IJ SOLN
INTRAMUSCULAR | Status: DC | PRN
  Administered 2019-07-26: 2 mg via INTRAVENOUS

## 2019-07-26 MED ORDER — DILTIAZEM HCL-DEXTROSE 125-5 MG/125ML-% IV SOLN (PREMIX)
5.0000 mg/h | INTRAVENOUS | Status: DC
  Administered 2019-07-26: 15 mg/h via INTRAVENOUS
  Filled 2019-07-26 (×2): qty 125

## 2019-07-26 MED ORDER — MAGNESIUM SULFATE 2 GM/50ML IV SOLN: 2.0000 g | Freq: Once | INTRAVENOUS | Status: DC

## 2019-07-26 MED ORDER — MIDAZOLAM HCL 2 MG/2ML IJ SOLN
INTRAMUSCULAR | Status: AC
  Filled 2019-07-26: qty 2

## 2019-07-26 MED ORDER — ONDANSETRON HCL 4 MG/2ML IJ SOLN: 4.0000 mg | Freq: Four times a day (QID) | INTRAMUSCULAR | Status: DC | PRN

## 2019-07-26 MED ORDER — FENTANYL CITRATE (PF) 250 MCG/5ML IJ SOLN
INTRAMUSCULAR | Status: DC | PRN
  Administered 2019-07-26: 100 ug via INTRAVENOUS
  Administered 2019-07-26 (×3): 50 ug via INTRAVENOUS

## 2019-07-26 MED ORDER — DIPHENHYDRAMINE HCL 50 MG/ML IJ SOLN
INTRAMUSCULAR | Status: DC | PRN
  Administered 2019-07-26: 12.5 mg via INTRAVENOUS

## 2019-07-26 MED ORDER — CHLORHEXIDINE GLUCONATE CLOTH 2 % EX PADS
6.0000 | MEDICATED_PAD | Freq: Every day | CUTANEOUS | Status: DC
  Administered 2019-07-26 – 2019-09-07 (×43): 6 via TOPICAL

## 2019-07-26 MED ORDER — LACTATED RINGERS IV SOLN: INTRAVENOUS | Status: DC | PRN

## 2019-07-26 MED ORDER — ORAL CARE MOUTH RINSE
15.0000 mL | OROMUCOSAL | Status: DC
  Administered 2019-07-26 – 2019-07-31 (×47): 15 mL via OROMUCOSAL

## 2019-07-26 MED ORDER — MAGNESIUM SULFATE 4 GM/100ML IV SOLN
4.0000 g | Freq: Once | INTRAVENOUS | Status: AC
  Administered 2019-07-26: 4 g via INTRAVENOUS
  Filled 2019-07-26: qty 100

## 2019-07-26 MED ORDER — VANCOMYCIN HCL 1000 MG IV SOLR
INTRAVENOUS | Status: DC | PRN
  Administered 2019-07-26: 3000 mg

## 2019-07-26 MED ORDER — DILTIAZEM HCL-DEXTROSE 125-5 MG/125ML-% IV SOLN (PREMIX)
5.0000 mg/h | INTRAVENOUS | Status: AC
  Administered 2019-07-26: 15 mg/h via INTRAVENOUS

## 2019-07-26 MED ORDER — POVIDONE-IODINE 10 % EX SWAB: 2.0000 "application " | Freq: Once | CUTANEOUS | Status: DC

## 2019-07-26 MED ORDER — ALBUMIN HUMAN 5 % IV SOLN: INTRAVENOUS | Status: DC | PRN

## 2019-07-26 MED ORDER — FENTANYL CITRATE (PF) 100 MCG/2ML IJ SOLN
50.0000 ug | INTRAMUSCULAR | Status: DC | PRN
  Administered 2019-07-27: 50 ug via INTRAVENOUS
  Administered 2019-07-27 – 2019-07-29 (×7): 100 ug via INTRAVENOUS
  Filled 2019-07-26 (×7): qty 2

## 2019-07-26 MED ORDER — VANCOMYCIN HCL 1000 MG IV SOLR
INTRAVENOUS | Status: AC
  Filled 2019-07-26: qty 3000

## 2019-07-26 MED ORDER — FENTANYL CITRATE (PF) 100 MCG/2ML IJ SOLN
50.0000 ug | INTRAMUSCULAR | Status: AC | PRN
  Administered 2019-07-26 – 2019-07-31 (×3): 50 ug via INTRAVENOUS
  Filled 2019-07-26 (×5): qty 2

## 2019-07-26 MED ORDER — DOCUSATE SODIUM 100 MG PO CAPS
100.0000 mg | ORAL_CAPSULE | Freq: Two times a day (BID) | ORAL | Status: DC
  Administered 2019-07-26: 100 mg via ORAL
  Filled 2019-07-26 (×2): qty 1

## 2019-07-26 SURGICAL SUPPLY — 52 items
ADH SKN CLS LQ APL DERMABOND (GAUZE/BANDAGES/DRESSINGS) ×1
BANDAGE ESMARK 6X9 LF (GAUZE/BANDAGES/DRESSINGS) ×1 IMPLANT
BLADE OSCIL/SAGITTAL W/10 ST (BLADE) ×1 IMPLANT
BLADE OSCIL/SAGITTAL W/10MM ST (BLADE) ×1
BNDG CMPR 9X6 STRL LF SNTH (GAUZE/BANDAGES/DRESSINGS) ×1
BNDG ESMARK 6X9 LF (GAUZE/BANDAGES/DRESSINGS) ×3
BOWL SMART MIX CTS (DISPOSABLE) ×3 IMPLANT
BRUSH FEMORAL CANAL (MISCELLANEOUS) ×4 IMPLANT
CEMENT HV SMART SET (Cement) ×8 IMPLANT
COVER SURGICAL LIGHT HANDLE (MISCELLANEOUS) ×3 IMPLANT
CUFF TOURN SGL QUICK 34 (TOURNIQUET CUFF) ×3
CUFF TRNQT CYL 34X4.125X (TOURNIQUET CUFF) IMPLANT
DERMABOND ADHESIVE PROPEN (GAUZE/BANDAGES/DRESSINGS) ×2
DERMABOND ADVANCED .7 DNX6 (GAUZE/BANDAGES/DRESSINGS) IMPLANT
DRAPE IMP U-DRAPE 54X76 (DRAPES) ×3 IMPLANT
DRAPE U-SHAPE 47X51 STRL (DRAPES) ×3 IMPLANT
DRSG AQUACEL AG ADV 3.5X10 (GAUZE/BANDAGES/DRESSINGS) ×2 IMPLANT
ELECT REM PT RETURN 9FT ADLT (ELECTROSURGICAL) ×3
ELECTRODE REM PT RTRN 9FT ADLT (ELECTROSURGICAL) ×1 IMPLANT
FACESHIELD STD STERILE (MASK) ×6 IMPLANT
FACESHIELD WRAPAROUND (MASK) ×6 IMPLANT
FACESHIELD WRAPAROUND OR TEAM (MASK) ×1 IMPLANT
GAUZE SPONGE 4X4 12PLY STRL (GAUZE/BANDAGES/DRESSINGS) ×3 IMPLANT
GLOVE BIOGEL PI IND STRL 7.5 (GLOVE) ×1 IMPLANT
GLOVE BIOGEL PI IND STRL 8 (GLOVE) ×1 IMPLANT
GLOVE BIOGEL PI INDICATOR 7.5 (GLOVE) ×2
GLOVE BIOGEL PI INDICATOR 8 (GLOVE) ×2
GLOVE ECLIPSE 8.0 STRL XLNG CF (GLOVE) ×3 IMPLANT
GLOVE ORTHO TXT STRL SZ7.5 (GLOVE) ×3 IMPLANT
GLOVE SURG ORTHO 8.0 STRL STRW (GLOVE) ×3 IMPLANT
GOWN STRL REIN 3XL XLG LVL4 (GOWN DISPOSABLE) ×3 IMPLANT
GOWN STRL REUS W/ TWL LRG LVL3 (GOWN DISPOSABLE) ×2 IMPLANT
GOWN STRL REUS W/TWL LRG LVL3 (GOWN DISPOSABLE) ×6
HANDPIECE INTERPULSE COAX TIP (DISPOSABLE) ×3
IMMOBILIZER KNEE 22 UNIV (SOFTGOODS) ×5 IMPLANT
JET LAVAGE IRRISEPT WOUND (IRRIGATION / IRRIGATOR) ×3
KIT BASIN OR (CUSTOM PROCEDURE TRAY) ×3 IMPLANT
KIT TURNOVER KIT B (KITS) ×3 IMPLANT
LAVAGE JET IRRISEPT WOUND (IRRIGATION / IRRIGATOR) IMPLANT
MANIFOLD NEPTUNE II (INSTRUMENTS) ×3 IMPLANT
NS IRRIG 1000ML POUR BTL (IV SOLUTION) ×3 IMPLANT
PACK TOTAL JOINT (CUSTOM PROCEDURE TRAY) ×3 IMPLANT
PACK UNIVERSAL I (CUSTOM PROCEDURE TRAY) ×3 IMPLANT
PAD ARMBOARD 7.5X6 YLW CONV (MISCELLANEOUS) ×6 IMPLANT
SET HNDPC FAN SPRY TIP SCT (DISPOSABLE) ×1 IMPLANT
SPONGE LAP 18X18 RF (DISPOSABLE) ×2 IMPLANT
SUT VIC AB 1 CT1 27 (SUTURE) ×12
SUT VIC AB 1 CT1 27XBRD ANBCTR (SUTURE) IMPLANT
SUT VIC AB 2-0 CT1 27 (SUTURE) ×9
SUT VIC AB 2-0 CT1 TAPERPNT 27 (SUTURE) ×3 IMPLANT
TOWEL GREEN STERILE (TOWEL DISPOSABLE) ×3 IMPLANT
TOWEL GREEN STERILE FF (TOWEL DISPOSABLE) ×3 IMPLANT

## 2019-07-26 NOTE — Significant Event (Signed)
D/w Dr. Tobias Alexander. Post-op pt was agitated and not safe for extubation. Pt transferred to [facility] under PCCM care Dr. Ruthann Cancer. I briefly spoke to Dr. Ruthann Cancer.  Appreciate Dr. Ruthann Cancer & PCCM care. TRH will s/o for now. Please contact us if we can be of any assistance.  [name], MD [facility] [phone]

## 2019-07-26 NOTE — Progress Notes (Addendum)
PROGRESS NOTE  Vernon Frye [id number] DOB: 12-Jul-1961 DOA: 07/12/2019 PCP: Nicholes Rough, PA-C  Brief History   58 year old man PMH atrial fibrillation not on Eliquis secondary to inability to pay, left total knee arthroplasty 2016, presented 12/15 2020 with left knee pain and swelling; admitted for atrial fibrillation with rapid ventricular response, severe sepsis secondary to left knee infection.  Underwent knee aspiration 12/16 with removal of purulent fluid and was taken to the OR emergently for septic left total knee replacement.  Course complicated by toxic metabolic encephalopathy requiring Precedex infusion, MRSA bacteremia secondary to septic right knee joint.  A & P  Severe sepsis secondary to MRSA bacteremia secondary to septic left knee joint, status post washout and antibiotic spacer placement.  Presented with AKI.  Status post operative intervention 12/16. --Remains encephalopathic.  Fortunately afebrile. --TEE eventually planned but was deferred 12/28 secondary to agitated state. -- will need 6 weeks of IV antibiotics per infectious disease, and date August 15, 2019.  Follow-up infectious disease clinic 1/12 at 10:15 AM  Acute toxic metabolic encephalopathy, thought secondary to sepsis, possibly polysubstance abuse, alcohol withdrawal.  Noted 12/16.  Treated with Precedex in the ICU, twice.  Documented nearly continuously throughout hospitalization.  Was too confused to sign consent 12/24 for TEE.  CT head no acute abnormalities.  He has been confused or agitated most days.   --More awake and speech clear, more coherent today.  He still confused.  I described to him knee infection and the need for surgery to clear infection and he was agreeable, although still confused. --Mild hypokalemia on hypomagnesemia noncontributory. --His history is most suggestive of prolonged delirium, multifactorial including alcohol withdrawal, withdrawal from polysubstance abuse, life-threatening  infection.  His neurologic exam is nonfocal and CT head was negative.  I suspect this will gradually improve with treatment of infection, surgery and time.    Atrial fibrillation with rapid ventricular response, made difficulty controlled by agitation.  PMH atrial fibrillation.  Nonadherent with Eliquis as an outpatient.  Declined TEE/cardioversion 12/24. --Rate control has been difficult secondary to agitation and infection, fortunately hemodynamics otherwise stable.  Continue management per cardiology.  Alcohol use disorder versus dependence, polysubstance abuse.  UDS positive for amphetamines, benzodiazepines and opioids. --Per discussion with mother, patient has a history of street drug use and alcohol use.  Hyperglycemia --CBG remains stable.  Continue sliding scale insulin. --Check hemoglobin A1c  Hypomagnesemia, hypokalemia --Replete  AKI secondary to severe sepsis, initial creatinine 3.3.  Renal ultrasound showed mild left hydronephrosis. --AKI resolved  Acute hypoxic respiratory failure in the emergency department --Resolved.  Chronic pain, anxiety.  On Valium, alprazolam and morphine 30 mg 12-hour tablet, 3 times daily as an outpatient. According to office visit note from 09/29/2018, patient sees Dr. Hardin Negus from pain management, treated with Percocet 10/325 6 tabs per day, hydromorphone 4 mg in bedtime, has been on Xanax for 40 years taking 3 times daily. --Monitor.  Nutrition.  Reassess postoperatively 12/30.  Given improvement in mentation, may be able to start a diet tomorrow. --ST evaluation tomorrow.  Will need either oral diet or temporary tube feeds tomorrow.  Discussion.  As documented 12/28, the patient lacks capacity.  His condition is felt to be multifactorial including limb threatening infection, polysubstance abuse.  The patient is slightly more coherent today and accepting of surgery, though he lacks capacity still.  In discussion with Dr Alvan Dame, I concur with  recommendation for definitive management with surgery for the knee infection.  Patient has no  POA and 2 living family members are estranged as previously documented.  I believe it is in the patient's best interest to proceed.  Per hospital policy, recommend proceeding with surgery with Dr. Aurea Graff concurrence.  Code Status: Full Family Communication:  Disposition Plan: home    [name], MD  [facility] Direct contact: see [email or web address] (further directions at bottom of note if needed) 7PM-7AM contact night coverage as at bottom of note 07/26/2019, 10:39 AM  LOS: 13 days   Significant Hospital Events   . 12/16 admitted for severe sepsis secondary to left septic knee (status post knee arthroplasty 2016), atrial fibrillation with rapid ventricular response . 12/16 transfer to critical care service, ICU for multiorgan dysfunction . 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  Antibiotics changed to Zosyn and daptomycin. . 12/16 cardiology consulted for A. fib RVR . 12/16 irrigation debridement total knee . 12/17 severe agitation requiring Precedex; respiratory failure requiring BiPAP . 12/17 MRSA bacteremia noted . 12/17 ID consulted . 12/20 off Precedex, continued on Seroquel and Haldol . 12/21 started back on Precedex . 12/22 off Precedex   Consults:  . Orthopedics . PCCM . ID   Procedures:  . 12/16 left knee aspiration purulent fluid . 12/16 irrigation, debridement total knee with polyexchange  Significant Diagnostic Tests:  12/16 Renal ultrasound > Mild left-sided hydronephrosis is noted. 12/19 echocardiogram, LVEF 55-60%.  Normal LV function.  Global right ventricle with severely reduced systolic function. 12/21 MRI lumbar spine DJD, no neural impingement, no discitis 12/24 left upper extremity venous ultrasound negative for DVT. 12/26 CT left knee large subcutaneous fluid collection could represent abscess or seroma or hematoma anterior medial and lateral  aspects of the knee, moderate joint effusion or possible joint, prominent Baker's cyst which could contain joint fluid or pus 12/27 CT head no acute abnormality.   Micro Data:  12/16 SARS Cov2 >negative  12/16 BCx > positive Staph MRSA  12/16 UCx > 12/16 Strep A > Negative  12/16 Flu A/B > neg 12/16 MRSA PCR > positive  12/16 Body culture left knee > Abundant gram positive cocci    Antimicrobials:  .   Interval History/Subjective  More awake per nursing today, stronger, moving around more, at times coherent.  Patient reports knee pain.  No other complaints.  Still confused.  Objective   Vitals:  Vitals:   07/26/19 0603 07/26/19 0935  BP: (!) 145/97 (!) 168/107  Pulse:    Resp:    Temp:    SpO2:      Exam:  Constitutional.  Calm today, awake, alert, appears ill but not toxic. Psychiatric.  Speech is clear, more coherent, he is able to answer some simple questions.  Oriented to self, knee pain.  Not oriented to location, no response to further questions. Musculoskeletal.  Follows commands.  Moves all extremities to command.  Grossly normal tone and strength suspected although compliance with exam is not full. Cardiovascular.  Tachycardic, regular, no murmur, rub or gallop.  2+ bilateral lower extremity edema.  Telemetry atrial fibrillation with rapid ventricular response Respiratory.  Clear to auscultation bilaterally.  No wheezes, rales or rhonchi.  Normal respiratory effort. Abdomen.  Soft. Skin.  Left knee dressed.  There is some dried blood on the Ace bandage.  I have personally reviewed the following:   Today's Data  . Magnesium 1.6, will replace.  Potassium 3.3, will replace.  Remainder BMP unremarkable.  Phosphorus within normal limits. . WBC down to 10.7 today.  Hemoglobin slightly low  at 9.3.  Platelets within normal limits.,  Scheduled Meds: . diltiazem  90 mg Oral Q6H  . docusate sodium  100 mg Oral BID  . folic acid  1 mg Oral Daily  . insulin aspart  0-5  Units Subcutaneous QHS  . insulin aspart  0-6 Units Subcutaneous TID WC  . lisinopril  10 mg Oral Daily  . metoprolol succinate  200 mg Oral Daily  . multivitamin with minerals  1 tablet Oral Daily  . povidone-iodine  2 application Topical Once  . QUEtiapine  25 mg Oral BID  . thiamine  100 mg Oral Daily   Or  . thiamine  100 mg Intravenous Daily   Continuous Infusions: . sodium chloride 50 mL/hr at 07/25/19 0319  . sodium chloride Stopped (07/22/19 0158)  . sodium chloride 150 mL/hr at 07/25/19 2257  . magnesium sulfate bolus IVPB 4 g (07/26/19 0844)  . potassium chloride 10 mEq (07/26/19 0846)  . tranexamic acid    . vancomycin 1,250 mg (07/26/19 0314)    Principal Problem:   MRSA bacteremia Active Problems:   Septic joint (HCC)   Atrial fibrillation with rapid ventricular response (HCC)   AKI (acute kidney injury) ([place])   Hyponatremia   Severe sepsis (HCC)   Severe sepsis with acute organ dysfunction (HCC)   Respiratory insufficiency   Acute pain of left knee   Prosthetic joint infection (HCC)   Tachypnea   Acute bilateral low back pain without sciatica   Left arm swelling   Red man syndrome   Acute metabolic encephalopathy   LOS: 13 days   How to contact the [facility] Attending or Consulting provider [place] or covering provider during after hours [place], for this patient?  1. Check the care team in [facility] and look for a) attending/consulting [facility] provider listed and b) the [facility] team listed 2. Log into [email or web address] and use [place]'s universal password to access. If you do not have the password, please contact the hospital operator. 3. Locate the [facility] provider you are looking for under [facility] and page to a number that you can be directly reached. 4. If you still have difficulty reaching the provider, please page the [facility] (Director on Call) for the Hospitalists listed on amion for assistance.

## 2019-07-26 NOTE — Interval H&P Note (Signed)
History and Physical Interval Note:  07/26/2019 1:31 PM  Vernon Frye  has presented today for surgery, with the diagnosis of infected left total knee arthroplasty.  The various methods of treatment have been discussed with the patient and family. After consideration of risks, benefits and other options for treatment, the patient has consented to  Procedure(s): TOTAL KNEE REVISION removal of left total knee replacement with placement of antibiotic spacers (Left) as a surgical intervention.  The patient's history has been reviewed, patient examined, no change in status, stable for surgery.  I have reviewed the patient's chart and labs.  Questions were answered to the patient's satisfaction.     [name]

## 2019-07-26 NOTE — Progress Notes (Signed)
The consent has not been signed. Per [name] case is emergent with no family available. Dr. Tobias Alexander aware that patient is in a-fib RVR. CRNA at bedside. Called for diltiazem drip per Dr. Angela Nevin.

## 2019-07-26 NOTE — Progress Notes (Signed)
eLink Physician-Brief Progress Note Patient Name: Vernon Frye DOB: 1961-02-02 MRN: [id number]   Date of Service  07/26/2019  HPI/Events of Note  Pt with Afib with persistent RVR, rates 135-140 despite  Cardizem infusion at 15 mg/hr  eICU Interventions  Will give one dose of Amiodarone 150 mg iv to try to materially slow the ventricular response rate.        Kerry Kass Zetta Stoneman 07/26/2019, 11:55 PM

## 2019-07-26 NOTE — Progress Notes (Signed)
Removed old bandage material from left knee- upon removing sanguineous drainage began to "stream out" with a few small clots present- there was a large amount in total. Replaced abdominal pads and wrapped legs with ace bandages per order.

## 2019-07-26 NOTE — Anesthesia Preprocedure Evaluation (Addendum)
Anesthesia Evaluation  Patient identified by MRN, date of birth, ID band Patient confused    Reviewed: Allergy & Precautions, NPO status , Patient's Chart, lab work & pertinent test results  History of Anesthesia Complications Negative for: history of anesthetic complications  Airway Mallampati: II   Neck ROM: Full    Dental  (+) Poor Dentition   Pulmonary former smoker,    Pulmonary exam normal        Cardiovascular hypertension, Pt. on medications + dysrhythmias Atrial Fibrillation  Rhythm:Irregular Rate:Tachycardia  IMPRESSIONS     1. Left ventricular ejection fraction, by visual estimation, is 55 to 60%. The left ventricle has normal function. There is mildly increased left ventricular hypertrophy. 2. Left ventricular diastolic function could not be evaluated. 3. Right ventricular volume/pressure overload. 4. The left ventricle demonstrates regional wall motion abnormalities. 5. Global right ventricle has severely reduced systolic function.The right ventricular size is severely enlarged. No increase in right ventricular wall thickness. 6. Left atrial size was normal. 7. Right atrial size was mild-moderately dilated. 8. Presence of pericardial fat pad. 9. Trivial pericardial effusion is present. 10. Mild mitral annular calcification. 11. The mitral valve is degenerative. Trivial mitral valve regurgitation. 12. The tricuspid valve is grossly normal. Tricuspid valve regurgitation is trivial. 13. The aortic valve is tricuspid. Aortic valve regurgitation is not visualized. No evidence of aortic valve sclerosis or stenosis. 14. The pulmonic valve was grossly normal. Pulmonic valve regurgitation is not visualized. 15. TR signal is inadequate for assessing pulmonary artery systolic pressure. 16. The inferior vena cava is normal in size with greater than 50% respiratory variability, suggesting right atrial pressure of 3 mmHg. 17.  No prior Echocardiogram.   Neuro/Psych Anxiety    GI/Hepatic negative GI ROS, Neg liver ROS,   Endo/Other  negative endocrine ROS  Renal/GU      Musculoskeletal  (+) Arthritis ,   Abdominal   Peds  Hematology   Anesthesia Other Findings   Reproductive/Obstetrics                            Anesthesia Physical  Anesthesia Plan  ASA: III and emergent  Anesthesia Plan: General   Post-op Pain Management:    Induction: Intravenous  PONV Risk Score and Plan: 3 and Ondansetron, Diphenhydramine and Treatment may vary due to age or medical condition  Airway Management Planned: Oral ETT  Additional Equipment:   Intra-op Plan:   Post-operative Plan: Extubation in OR  Informed Consent: I have reviewed the patients History and Physical, chart, labs and discussed the procedure including the risks, benefits and alternatives for the proposed anesthesia with the patient or authorized representative who has indicated his/her understanding and acceptance.       Plan Discussed with: Anesthesiologist, Surgeon and CRNA  Anesthesia Plan Comments:       Anesthesia Quick Evaluation

## 2019-07-26 NOTE — Progress Notes (Signed)
NAME:  Vernon Frye, MRN:  [id number], DOB:  31-Dec-1960, LOS: 23 ADMISSION DATE:  07/12/2019, CONSULTATION DATE:  07/13/19 REFERRING MD:  [place] [facility], CHIEF COMPLAINT:  L knee pain  Brief History   58 yo M presents with L knee pair, prior L TKR   History of present illness   58 yo M PMH Afib, HTN, L TKR (2016) who presented to ED with CC L knee pain. Pain began 12/14 and has progressed prompting presentation. 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 Desaturated with hypoxemic respiratory failure requiring 3LNC.  PCCM consulted for admission.  Went for knee washout and abx spacer placement on 12/16.   Past Medical History  A fib HTN Anxiety L TKR  L shoulder arthroscopy  DDD  Significant Hospital Events   12/16 presents to ED for L knee pain. Concern for septic joint. A fiv RVR. Developed SOB after vanc administration. PCCM consulted for admission  Consults:  Ortho  Cardiology ID EP  Procedures:  12/16 left knee Irrigation and debridement, total knee with poly exchange   12/29 L knee revision and replacement Significant Diagnostic Tests:  12/19 ECHO > LVEF 55-60% 12/15 L knee XR > prior L knee replacement. Moderate joint effusion, no body abnormality  12/16 Renal ultrasound > Mild left-sided hydronephrosis is noted. 12/21 MRI lumbar spine DJD, no neural impingement, no discitis 12/24 UE venous: Right: No evidence of thrombosis in the subclavian.  Left:No evidence of deep vein thrombosis in the upper extremity. Findings consistent with acute superficial vein thrombosis involving the left cephalic vein. 12/27 cth: No acute intracranial abnormality. Non contrast CT appearance of the brain within normal limits for age.  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  Micro Data:  12/16 SARS Cov2 >negative  12/16 BCx > positive Staph MRSA  12/16 Strep A >  Negative  12/16 Flu A/B > neg 12/16 MRSA PCR > positive  12/16 Body culture left knee > Abundant gram positive cocci  12/18 blood: negative  Antimicrobials:  12/16 vanc x1 12/16 ceftriaxone x1 12/16 Zosyn > 12/16 Daptomycin 12/17 >12/21 vanc 12/22->6 weeks planned thru 08/15/19  Interim history/subjective:  12/29: remains intubated post operatively   Objective   Blood pressure (!) 163/82, pulse (!) 118, temperature 98.2 F (36.8 C), temperature source Axillary, resp. rate 18, height 6\' 3"  (1.905 m), weight 103 kg, SpO2 97 %.        Intake/Output Summary (Last 24 hours) at 07/26/2019 1532 Last data filed at 07/26/2019 1500 Gross per 24 hour  Intake 1928.32 ml  Output 775 ml  Net 1153.32 ml   Filed Weights   07/23/19 0426 07/24/19 0405 07/25/19 0543  Weight: 107 kg 106.1 kg 103 kg    Examination: General: Adult male lying in bed, sedated unresponsive with ett in place HEENT: [place]/AT, MM pink/moist, PERRL,  Neuro: unresponsive with vent ni place CV: irregularly irregular, HR in 120s, no mrg PULM:  Diminished but clear bilaterally GI: soft, bowel sounds active in all 4 quadrants, non-tender, non-distended Extremities: warm/dry, 2+ pitting lower extremity edema, post surgical changed on L knee Skin: no rashes or lesions  Resolved Hospital Problem list     Assessment & Plan:  Post operative resp insuff:  -reportedly 2/2 mental status -will wean vent and assess sat/sbt -hopefully extubate in am.   Acute encephalopathy secondary to agitated delirium - multifactorial could also be from polysubstance abuse or  etoh withdrawal. +/- sepsis P -cont thiamine with chronic etoh abuse.  -precedex gtt as needed as well for weaning purposes -prn fentanyl -recheck cth negative  MRSA Bacteremia  -resolved bacteremia on 6 weeks vanc per ID -repeat cx negative 12/18 P MRI negative for spinal abscess or osteomyelitis.  -TEE refused by pt and per ID note will not change plan  until intervention from CTS needed and not likely a good candidate for that. However if needed would recommend it is done tomorrow while pt remains intubated.   Afib RVR -non-adherent w eliquis outpatient (2/2 financial constraints) P -Per cardiology, not candidate for dccv -Appreciate input.  -Has been on po cardizem and toprol -Holding a/c 2/2 recent operative intervention. Will resume as soon as ortho ok.    Left Knee Septic joint -suspect septic joint in setting of L TKR in 2016 -Underwent I&D with partial hardwear exchange 12/16 P -s/p revision today.  -ortho to ok a/c restart   AKI -Hx kidney stones  -Hx urinary retention  -Renal ultrasound with mild left-sided hydronephrosis P Follow renal function / urine output (stable) Avoid nephrotoxins, ensure adequate renal perfusion   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 Substance Use  -denies substance use; UDS pos for Amphetamines, BZD, Opioids (Home opioids for pain, BZD for anxiety) P Continue thiamine  On seroquel and haldol for now  Prediabetes a1c 6.3 -monitor gluc  Normocytic anemia:  -follow. Holding a/c at this time post oepratively  Best practice:  Diet: npo, hopefully can be extubated tomorrow if not will start tf Pain/Anxiety/Delirium protocol (if indicated): per protocol VAP protocol (if indicated): per protocol DVT prophylaxis: held GI prophylaxis: pepcid  Glucose control: monitor Mobility: bed rest Code Status: Full  Family Communication:  Disposition: ICU    Labs   CBC: Recent Labs  Lab 07/20/19 0222 07/22/19 0106 07/23/19 0535 07/24/19 0404 07/25/19 0653 07/26/19 0404  WBC 21.1* 18.7* 14.8* 18.5* 17.7* 10.7*  NEUTROABS 16.7*  --   --   --   --   --   HGB 11.1* 10.5* 8.5* 10.0* 10.5* 9.3*  HCT 33.4* 32.3* 26.4* 31.3* 33.1* 28.4*  MCV 88.1 89.5 93.0 91.0 92.7 91.0  PLT 311 380 PLATELET CLUMPS NOTED ON SMEAR, UNABLE TO ESTIMATE 423* 483* [id number]    Basic Metabolic Panel: Recent Labs    Lab 07/21/19 0350 07/23/19 1623 07/24/19 0404 07/25/19 0653 07/26/19 0404  NA 138 139 141 145 144  K 4.3 3.2* 3.7 3.7 3.3*  CL 107 107 110 112* 113*  CO2 18* 23 22 20* 22  GLUCOSE 183* 152* 145* 144* 144*  BUN 20 13 12 13 15   CREATININE 0.82 0.72 0.76 0.73 0.70  CALCIUM 7.3* 7.7* 7.7* 7.8* 7.9*  MG  --  1.5*  --  1.6* 1.6*  PHOS  --   --   --  2.4* 2.8   GFR: Estimated Creatinine Clearance: 130.8 mL/min (by C-G formula based on SCr of 0.7 mg/dL). Recent Labs  Lab 07/23/19 0535 07/24/19 0404 07/25/19 0653 07/26/19 0404  WBC 14.8* 18.5* 17.7* 10.7*    Liver Function Tests: Recent Labs  Lab 07/23/19 1623  AST 69*  ALT 96*  ALKPHOS 102  BILITOT 1.7*  PROT 6.3*  ALBUMIN 2.1*   No results for input(s): LIPASE, AMYLASE in the last 168 hours. Recent Labs  Lab 07/23/19 1623  AMMONIA 27    ABG    Component Value Date/Time   PHART 7.355 07/13/2019 2357   PCO2ART 35.9  07/13/2019 2357   PO2ART 72.0 (L) 07/13/2019 2357   HCO3 20.2 07/13/2019 2357   TCO2 21 (L) 07/13/2019 2357   ACIDBASEDEF 5.0 (H) 07/13/2019 2357   O2SAT 94.0 07/13/2019 2357     Coagulation Profile: No results for input(s): INR, PROTIME in the last 168 hours.  Cardiac Enzymes: No results for input(s): CKTOTAL, CKMB, CKMBINDEX, TROPONINI in the last 168 hours.  HbA1C: Hgb A1c MFr Bld  Date/Time Value Ref Range Status  07/13/2019 04:08 PM 6.3 (H) 4.8 - 5.6 % Final    Comment:    (NOTE) Pre diabetes:          5.7%-6.4% Diabetes:              >6.4% Glycemic control for   <7.0% adults with diabetes     CBG: Recent Labs  Lab 07/25/19 1944 07/25/19 2319 07/26/19 0347 07/26/19 0813 07/26/19 1147  GLUCAP 153* 159* 141* 138* 127*     Critical care time: The patient is critically ill with multiple organ systems failure and requires high complexity decision making for assessment and support, frequent evaluation and titration of therapies, application of advanced monitoring technologies  and extensive interpretation of multiple databases.  Critical care time 40 mins. This represents my time independent of the NPs time taking care of the pt. This is excluding procedures.    [facility] 07/26/2019, 3:32 PM

## 2019-07-26 NOTE — Progress Notes (Signed)
Progress Note  Patient Name: Vernon Frye Date of Encounter: 07/26/2019  Primary Cardiologist:   Sinclair Grooms, MD   Subjective   Continued confusion   Inpatient Medications    Scheduled Meds: . diltiazem  90 mg Oral Q6H  . docusate sodium  100 mg Oral BID  . folic acid  1 mg Oral Daily  . insulin aspart  0-5 Units Subcutaneous QHS  . insulin aspart  0-6 Units Subcutaneous TID WC  . lisinopril  10 mg Oral Daily  . metoprolol succinate  200 mg Oral Daily  . multivitamin with minerals  1 tablet Oral Daily  . QUEtiapine  25 mg Oral BID  . thiamine  100 mg Oral Daily   Or  . thiamine  100 mg Intravenous Daily   Continuous Infusions: . sodium chloride 50 mL/hr at 07/25/19 0319  . sodium chloride Stopped (07/22/19 0158)  . sodium chloride 150 mL/hr at 07/25/19 2257  . magnesium sulfate bolus IVPB    . potassium chloride    . vancomycin 1,250 mg (07/26/19 0314)   PRN Meds: haloperidol lactate, LORazepam, menthol-cetylpyridinium **OR** phenol, metoprolol tartrate, ondansetron **OR** ondansetron (ZOFRAN) IV, oxyCODONE, Resource ThickenUp Clear, sodium chloride flush   Vital Signs    Vitals:   07/25/19 1947 07/25/19 2257 07/25/19 2320 07/26/19 0603  BP: (!) 145/92 (!) 155/88 (!) 143/82 (!) 145/97  Pulse: (!) 111  87   Resp: (!) 24  20   Temp: 98.4 F (36.9 C)  98.2 F (36.8 C)   TempSrc: Oral  Axillary   SpO2: 100%  95%   Weight:      Height:        Intake/Output Summary (Last 24 hours) at 07/26/2019 0810 Last data filed at 07/25/2019 2246 Gross per 24 hour  Intake 1748.32 ml  Output 775 ml  Net 973.32 ml   Filed Weights   07/23/19 0426 07/24/19 0405 07/25/19 0543  Weight: 107 kg 106.1 kg 103 kg    Telemetry    Atrial fibrillation with rapid rates- Personally Reviewed  ECG    NA - Personally Reviewed  Physical Exam   GEN: Well nourished, well developed, in no acute distress  HEENT: normal  Neck: no JVD, carotid bruits, or  masses Cardiac: Irregular, tachycardic; no murmurs, rubs, or gallops,no edema  Respiratory:  clear to auscultation bilaterally, normal work of breathing GI: soft, nontender, nondistended, + BS MS: no deformity or atrophy  Skin: warm and dry Neuro:  Strength and sensation are intact Psych: Confused     Labs    Chemistry Recent Labs  Lab 07/23/19 1623 07/24/19 0404 07/25/19 0653 07/26/19 0404  NA 139 141 145 144  K 3.2* 3.7 3.7 3.3*  CL 107 110 112* 113*  CO2 23 22 20* 22  GLUCOSE 152* 145* 144* 144*  BUN 13 12 13 15   CREATININE 0.72 0.76 0.73 0.70  CALCIUM 7.7* 7.7* 7.8* 7.9*  PROT 6.3*  --   --   --   ALBUMIN 2.1*  --   --   --   AST 69*  --   --   --   ALT 96*  --   --   --   ALKPHOS 102  --   --   --   BILITOT 1.7*  --   --   --   GFRNONAA >60 >60 >60 >60  GFRAA >60 >60 >60 >60  ANIONGAP 9 9 13 9      Hematology Recent  Labs  Lab 07/24/19 0404 07/25/19 0653 07/26/19 0404  WBC 18.5* 17.7* 10.7*  RBC 3.44* 3.57* 3.12*  HGB 10.0* 10.5* 9.3*  HCT 31.3* 33.1* 28.4*  MCV 91.0 92.7 91.0  MCH 29.1 29.4 29.8  MCHC 31.9 31.7 32.7  RDW 15.2 15.4 15.8*  PLT 423* 483* 365    Cardiac EnzymesNo results for input(s): TROPONINI in the last 168 hours. No results for input(s): TROPIPOC in the last 168 hours.   BNPNo results for input(s): BNP, PROBNP in the last 168 hours.   DDimer No results for input(s): DDIMER in the last 168 hours.   Radiology    CT HEAD WO CONTRAST  Result Date: 07/24/2019 CLINICAL DATA:  58 year old male with unexplained altered mental status. Recent sepsis. EXAM: CT HEAD WITHOUT CONTRAST TECHNIQUE: Contiguous axial images were obtained from the base of the skull through the vertex without intravenous contrast. COMPARISON:  Brain MRI 09/16/2018. 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 Cerebral volume is stable and within normal  limits. No midline shift, ventriculomegaly, mass effect, evidence of mass lesion, intracranial hemorrhage or evidence of cortically based acute infarction. Gray-white matter differentiation within normal limits. Vascular: Calcified atherosclerosis at the skull base. No suspicious intracranial vascular hyperdensity. Skull: No acute osseous abnormality identified. Sinuses/Orbits: Minimal bubbly opacity in the left sphenoid sinus but other visualized paranasal sinuses and mastoids are clear. Other: Visualized orbits and scalp soft tissues are within normal limits. IMPRESSION: No acute intracranial abnormality. Non contrast CT appearance of the brain within normal limits for age. 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 Electronically Signed   By: Genevie Ann M.D.   On: 07/24/2019 12:29    Cardiac Studies   2D echo 06/2019 IMPRESSIONS   1. Left ventricular ejection fraction, by visual estimation, is 55 to 60%. The left ventricle has normal function. There is mildly increased left ventricular hypertrophy. 2. Left ventricular diastolic function could not be evaluated. 3. Right ventricular volume/pressure overload. 4. The left ventricle demonstrates regional wall motion abnormalities. 5. Global right ventricle has severely reduced systolic function.The right ventricular size is severely enlarged. No increase in right ventricular wall thickness. 6. Left atrial size was normal. 7. Right atrial size was mild-moderately dilated. 8. Presence of pericardial fat pad. 9. Trivial pericardial effusion is present. 10. Mild mitral annular calcification. 11. The mitral valve is degenerative. Trivial mitral valve regurgitation. 12. The tricuspid valve is grossly normal. Tricuspid valve regurgitation is trivial. 13. The aortic valve is tricuspid. Aortic valve regurgitation is not visualized. No evidence of aortic valve sclerosis or stenosis. 14. The pulmonic valve was  grossly normal. Pulmonic valve regurgitation is not visualized. 15. TR signal is inadequate for assessing pulmonary artery systolic pressure. 16. The inferior vena cava is normal in size with greater than 50% respiratory variability, suggesting right atrial pressure of 3 mmHg. 17. No prior Echocardiogram.  Patient Profile     58 y.o. male with recent septic knee (prosthetic joint infection), MRSA bacteremia who is being followed for post-op afib with RVR  Assessment & Plan    ATRIAL FIB:   Rate up due to delirium on max dose oral cardiazem and toprol can cover with iv lopressor for Hr > 150 bpm  DOAC held 07/22/19 due to bleeding from knee will continue to hold and observe    HTN: Overall well controlled throughout the day.  TKR:  On left on antibiotics with LE edema  per ortho Dr Alvan Dame note indicates to OR latter today Anesthesia can use iv cardizem or amiodarone for rate control during case   For questions or updates, please contact [place] Please consult [email or web address] for contact info under Cardiology/STEMI.   Signed, Jenkins Rouge, MD  07/26/2019, 8:10 AM

## 2019-07-26 NOTE — Brief Op Note (Signed)
07/12/2019 - 07/26/2019  1:33 PM  PATIENT:  Vernon Frye  58 y.o. male  PRE-OPERATIVE DIAGNOSIS:  infected left total knee arthroplasty  POST-OPERATIVE DIAGNOSIS:  infected left total knee arthroplasty  PROCEDURE:  Procedure(s): TOTAL KNEE REVISION removal of left total knee replacement with placement of antibiotic spacers (Left)  SURGEON:  Surgeon(s) and Role:    Paralee Cancel, MD - Primary  PHYSICIAN ASSISTANT: Danae Orleans, PA-C  ANESTHESIA:   general  EBL:  <100 cc  BLOOD ADMINISTERED:none  DRAINS: none   LOCAL MEDICATIONS USED:  NONE  SPECIMEN:  Source of Specimen:  left knee synovial fluid  DISPOSITION OF SPECIMEN:  PATHOLOGY  COUNTS:  YES  TOURNIQUET:  Per operative record  DICTATION: .Other Dictation: Dictation Number [id number]  PLAN OF CARE: Admit to inpatient   PATIENT DISPOSITION:  ICU - intubated and hemodynamically stable.   Delay start of Pharmacological VTE agent (>24hrs) due to surgical blood loss or risk of bleeding: yes

## 2019-07-26 NOTE — Anesthesia Procedure Notes (Signed)
Procedure Name: Intubation Date/Time: 07/26/2019 2:31 PM Performed by: Kathryne Hitch, CRNA Pre-anesthesia Checklist: Patient identified, Emergency Drugs available, Suction available and Patient being monitored Patient Re-evaluated:Patient Re-evaluated prior to induction Oxygen Delivery Method: Circle system utilized Preoxygenation: Pre-oxygenation with 100% oxygen Induction Type: IV induction Ventilation: Mask ventilation without difficulty Laryngoscope Size: Miller and 3 Grade View: Grade I Tube type: Oral Tube size: 7.5 mm Number of attempts: 1 Airway Equipment and Method: Stylet and Oral airway Placement Confirmation: ETT inserted through vocal cords under direct vision,  positive ETCO2 and breath sounds checked- equal and bilateral Secured at: 23 cm Tube secured with: Tape Dental Injury: Teeth and Oropharynx as per pre-operative assessment

## 2019-07-26 NOTE — Progress Notes (Signed)
Patient ID: Vernon Frye, male   DOB: [birth year], 58 y.o.   MRN: [id number]   Patient with MRSA bacteremia and in left knee with TKR Best option for successful treatment of this type of infection is to resect the completely Initial treatment was hopeful and intended to decrease initial infectious burden.   I feel that the infection is persistent and interfering in his overall recvoery  Team has spoken with Risk Management about his health state and lack of reliable POA to sign consent.  He will go to OR with urgent/emergent need for consent purpsoses  NPO To OR this pm

## 2019-07-26 NOTE — H&P (View-Only) (Signed)
Patient ID: Vernon Frye, male   DOB: 10-28-1960, 58 y.o.   MRN: [id number]   Patient with MRSA bacteremia and in left knee with TKR Best option for successful treatment of this type of infection is to resect the completely Initial treatment was hopeful and intended to decrease initial infectious burden.   I feel that the infection is persistent and interfering in his overall recvoery  Team has spoken with Risk Management about his health state and lack of reliable POA to sign consent.  He will go to OR with urgent/emergent need for consent purpsoses  NPO To OR this pm

## 2019-07-26 NOTE — Anesthesia Postprocedure Evaluation (Signed)
Anesthesia Post Note  Patient: Vernon Frye  Procedure(s) Performed: TOTAL KNEE REVISION removal of left total knee replacement with placement of antibiotic spacers (Left Knee)     Patient location during evaluation: SICU Anesthesia Type: General Level of consciousness: sedated Pain management: pain level controlled Vital Signs Assessment: post-procedure vital signs reviewed and stable Respiratory status: patient remains intubated per anesthesia plan Cardiovascular status: stable Postop Assessment: no apparent nausea or vomiting Anesthetic complications: no    Last Vitals:  Vitals:   07/26/19 1620 07/26/19 1639  BP: 101/73   Pulse: 88 87  Resp: 16 16  Temp:    SpO2: 100% 100%    Last Pain:  Vitals:   07/26/19 0800  TempSrc:   PainSc: 0-No pain                 Ly Wass DANIEL

## 2019-07-26 NOTE — Transfer of Care (Signed)
Immediate Anesthesia Transfer of Care Note  Patient: Vernon Frye  Procedure(s) Performed: TOTAL KNEE REVISION removal of left total knee replacement with placement of antibiotic spacers (Left Knee)  Patient Location: SICU  Anesthesia Type:General  Level of Consciousness: Patient remains intubated per anesthesia plan  Airway & Oxygen Therapy: Patient remains intubated per anesthesia plan and Patient placed on Ventilator (see vital sign flow sheet for setting)  Post-op Assessment: Report given to RN and Post -op Vital signs reviewed and stable  Post vital signs: Reviewed and stable  Last Vitals:  Vitals Value Taken Time  BP    Temp    Pulse    Resp    SpO2      Last Pain:  Vitals:   07/26/19 0800  TempSrc:   PainSc: 0-No pain      Patients Stated Pain Goal: 0 ([id number])  Complications: No apparent anesthesia complications

## 2019-07-27 ENCOUNTER — Encounter: Payer: Self-pay | Admitting: *Deleted

## 2019-07-27 DIAGNOSIS — Z89522 Acquired absence of left knee: Secondary | ICD-10-CM

## 2019-07-27 DIAGNOSIS — Z9911 Dependence on respirator [ventilator] status: Secondary | ICD-10-CM

## 2019-07-27 DIAGNOSIS — Z881 Allergy status to other antibiotic agents status: Secondary | ICD-10-CM

## 2019-07-27 LAB — BASIC METABOLIC PANEL
Anion gap: 13 (ref 5–15)
BUN: 12 mg/dL (ref 6–20)
CO2: 19 mmol/L — ABNORMAL LOW (ref 22–32)
Calcium: 7.8 mg/dL — ABNORMAL LOW (ref 8.9–10.3)
Chloride: 110 mmol/L (ref 98–111)
Creatinine, Ser: 0.78 mg/dL (ref 0.61–1.24)
GFR calc Af Amer: >60 mL/min (ref >60)
GFR calc non Af Amer: >60 mL/min (ref >60)
Glucose, Bld: 148 mg/dL — ABNORMAL HIGH (ref 70–99)
Potassium: 3.7 mmol/L (ref 3.5–5.1)
Sodium: 142 mmol/L (ref 135–145)

## 2019-07-27 LAB — CBC
HCT: 31.2 % — ABNORMAL LOW (ref 39.0–52.0)
Hemoglobin: 10.1 g/dL — ABNORMAL LOW (ref 13.0–17.0)
MCH: 30.1 pg (ref 26.0–34.0)
MCHC: 32.4 g/dL (ref 30.0–36.0)
MCV: 92.9 fL (ref 80.0–100.0)
Platelets: 234 10*3/uL (ref 150–400)
RBC: 3.36 MIL/uL — ABNORMAL LOW (ref 4.22–5.81)
RDW: 16.2 % — ABNORMAL HIGH (ref 11.5–15.5)
WBC: 10.8 10*3/uL — ABNORMAL HIGH (ref 4.0–10.5)
nRBC: 0 % (ref 0.0–0.2)

## 2019-07-27 LAB — GLUCOSE, CAPILLARY
Glucose-Capillary: 132 mg/dL — ABNORMAL HIGH (ref 70–99)
Glucose-Capillary: 135 mg/dL — ABNORMAL HIGH (ref 70–99)
Glucose-Capillary: 138 mg/dL — ABNORMAL HIGH (ref 70–99)
Glucose-Capillary: 138 mg/dL — ABNORMAL HIGH (ref 70–99)
Glucose-Capillary: 150 mg/dL — ABNORMAL HIGH (ref 70–99)
Glucose-Capillary: 174 mg/dL — ABNORMAL HIGH (ref 70–99)

## 2019-07-27 LAB — HEMOGLOBIN A1C
Hgb A1c MFr Bld: 5.2 % (ref 4.8–5.6)
Mean Plasma Glucose: 102.54 mg/dL

## 2019-07-27 LAB — MAGNESIUM: Magnesium: 1.6 mg/dL — ABNORMAL LOW (ref 1.7–2.4)

## 2019-07-27 MED ORDER — DOCUSATE SODIUM 50 MG/5ML PO LIQD
100.0000 mg | Freq: Two times a day (BID) | ORAL | Status: DC
  Administered 2019-07-27 – 2019-07-29 (×5): 100 mg
  Filled 2019-07-27 (×5): qty 10

## 2019-07-27 MED ORDER — FOLIC ACID 1 MG PO TABS: 1.0000 mg | ORAL_TABLET | Freq: Every day | ORAL | Status: DC

## 2019-07-27 MED ORDER — MAGNESIUM SULFATE 4 GM/100ML IV SOLN
4.0000 g | Freq: Once | INTRAVENOUS | Status: AC
  Administered 2019-07-27: 4 g via INTRAVENOUS
  Filled 2019-07-27: qty 100

## 2019-07-27 MED ORDER — ESMOLOL BOLUS VIA INFUSION
500.0000 ug/kg | Freq: Once | INTRAVENOUS | Status: AC
  Administered 2019-07-27: 11:00:00 51300 ug via INTRAVENOUS
  Filled 2019-07-27: qty 52000

## 2019-07-27 MED ORDER — VITAL 1.5 CAL PO LIQD
1000.0000 mL | ORAL | Status: DC
  Administered 2019-07-27 – 2019-07-30 (×3): 1000 mL
  Filled 2019-07-27 (×3): qty 1000

## 2019-07-27 MED ORDER — LISINOPRIL 10 MG PO TABS
10.0000 mg | ORAL_TABLET | Freq: Every day | ORAL | Status: DC
  Administered 2019-07-28 – 2019-07-31 (×4): 10 mg
  Filled 2019-07-27 (×4): qty 1

## 2019-07-27 MED ORDER — AMIODARONE HCL IN DEXTROSE 360-4.14 MG/200ML-% IV SOLN
60.0000 mg/h | INTRAVENOUS | Status: AC
  Administered 2019-07-27: 60 mg/h via INTRAVENOUS

## 2019-07-27 MED ORDER — ADULT MULTIVITAMIN LIQUID CH
15.0000 mL | Freq: Every day | ORAL | Status: DC
  Filled 2019-07-27: qty 15

## 2019-07-27 MED ORDER — DEXMEDETOMIDINE HCL IN NACL 400 MCG/100ML IV SOLN
0.4000 ug/kg/h | INTRAVENOUS | Status: AC
  Administered 2019-07-27: 10:00:00 0.4 ug/kg/h via INTRAVENOUS
  Administered 2019-07-27: 17:00:00 0.5 ug/kg/h via INTRAVENOUS
  Administered 2019-07-28: 17:00:00 1 ug/kg/h via INTRAVENOUS
  Administered 2019-07-28: 0.5 ug/kg/h via INTRAVENOUS
  Administered 2019-07-28: 0.6 ug/kg/h via INTRAVENOUS
  Administered 2019-07-28 – 2019-07-29 (×2): 1 ug/kg/h via INTRAVENOUS
  Administered 2019-07-29: 1.002 ug/kg/h via INTRAVENOUS
  Administered 2019-07-30 (×3): 1 ug/kg/h via INTRAVENOUS
  Filled 2019-07-27 (×14): qty 100
  Filled 2019-07-27: qty 200

## 2019-07-27 MED ORDER — QUETIAPINE FUMARATE 25 MG PO TABS
25.0000 mg | ORAL_TABLET | Freq: Two times a day (BID) | ORAL | Status: DC
  Administered 2019-07-27 – 2019-07-31 (×8): 25 mg
  Filled 2019-07-27 (×7): qty 1

## 2019-07-27 MED ORDER — THIAMINE HCL 100 MG PO TABS: 100.0000 mg | ORAL_TABLET | Freq: Every day | ORAL | Status: DC

## 2019-07-27 MED ORDER — ESMOLOL HCL-SODIUM CHLORIDE 2000 MG/100ML IV SOLN
25.0000 ug/kg/min | INTRAVENOUS | Status: DC
  Administered 2019-07-27 (×2): 25 ug/kg/min via INTRAVENOUS
  Administered 2019-07-28 (×3): 75 ug/kg/min via INTRAVENOUS
  Administered 2019-07-28: 40 ug/kg/min via INTRAVENOUS
  Filled 2019-07-27 (×6): qty 100

## 2019-07-27 MED ORDER — THIAMINE HCL 100 MG/ML IJ SOLN: 100.0000 mg | Freq: Every day | INTRAMUSCULAR | Status: DC

## 2019-07-27 MED ORDER — PRO-STAT SUGAR FREE PO LIQD
60.0000 mL | Freq: Two times a day (BID) | ORAL | Status: DC
  Administered 2019-07-27 – 2019-08-02 (×10): 60 mL
  Filled 2019-07-27 (×9): qty 60

## 2019-07-27 MED ORDER — AMIODARONE HCL IN DEXTROSE 360-4.14 MG/200ML-% IV SOLN
30.0000 mg/h | INTRAVENOUS | Status: DC
  Administered 2019-07-27 – 2019-08-02 (×11): 30 mg/h via INTRAVENOUS
  Filled 2019-07-27 (×14): qty 200

## 2019-07-27 NOTE — Progress Notes (Signed)
     Subjective: 1 Day Post-Op Procedure(s) (LRB): TOTAL KNEE REVISION removal of left total knee replacement with placement of antibiotic spacers (Left)   Patient is intubated and resting in bed. Nurse mention that they had difficulty finding pulses last night, but she was able to get through the edema and found a strong dorsalis pedis pulse.     Objective:   VITALS:   Vitals:   07/27/19 0700 07/27/19 0747  BP: 138/80   Pulse: (!) 111   Resp: (!) 21   Temp:  99.6 F (37.6 C)  SpO2: 100%     Incision: dressing C/D/I and no drainage  Bilateral LE pitting edema   LABS Recent Labs    07/25/19 0653 07/26/19 0404 07/26/19 1719 07/27/19 0356  HGB 10.5* 9.3* 7.5* 10.1*  HCT 33.1* 28.4* 22.0* 31.2*  WBC 17.7* 10.7*  --  10.8*  PLT 483* 365  --  234    Recent Labs    07/25/19 0653 07/26/19 0404 07/26/19 1719 07/27/19 0356  NA 145 144 145 142  K 3.7 3.3* 3.7 3.7  BUN 13 15  --  12  CREATININE 0.73 0.70  --  0.78  GLUCOSE 144* 144*  --  148*     Assessment/Plan: 1 Day Post-Op Procedure(s) (LRB): TOTAL KNEE REVISION removal of left total knee replacement with placement of antibiotic spacers (Left)  Changed admitting as it was erroneously changed to [name]. Will continue to follow and appreciate medicine caring for the patient Currently incision/dressing is clean without drainage Continue to use ice and elevation for swelling of the leg PT once extubated and stable Defer to Cardio for anticoagulation and a-fib concerns         [place] Pugh. Keviana Guida   PAC  07/27/2019, 7:51 AM

## 2019-07-27 NOTE — Progress Notes (Addendum)
Notified Ortho MD on increased swelling and now absent pulses on LLE. Cap refill is greater than 3 seconds. Recommends elevation and ice packs on LLE. Will continue to monitor

## 2019-07-27 NOTE — Progress Notes (Signed)
NAME:  Vernon Frye, MRN:  [id number], DOB:  [birth year], LOS: 18 ADMISSION DATE:  07/12/2019, CONSULTATION DATE:  07/13/19 REFERRING MD:  [place] [facility], CHIEF COMPLAINT:  L knee pain  Brief History   58 yo M PMH Afib, HTN, L TKR (2016) who presented to ED with CC L knee pain. 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   Past Medical History  A fib HTN Anxiety L TKR  L shoulder arthroscopy  DDD  Significant Hospital Events   12/16 presents to ED for L knee pain. Concern for septic joint. A fiv RVR. Developed SOB after vanc administration. PCCM consulted for admission Underwent left knee Irrigation and debridement, total knee with poly exchange     12/29: L knee revision and replacement, remains intubated post operatively   Consults:  Ortho  Cardiology ID EP  Procedures:  ETT 12/29 >>   Significant Diagnostic Tests:  12/19 ECHO > LVEF 55-60% 12/15 L knee XR > prior L knee replacement. Moderate joint effusion, no body abnormality  12/16 Renal ultrasound > Mild left-sided hydronephrosis is noted. 12/21 MRI lumbar spine DJD, no neural impingement, no discitis 12/24 UE venous: Right: No evidence of thrombosis in the subclavian.  Left:No evidence of deep vein thrombosis in the upper extremity. Findings consistent with acute superficial vein thrombosis involving the left cephalic vein. 12/27 cth: No acute intracranial abnormality. Non contrast CT appearance of the brain within normal limits for age.  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  Micro Data:  12/16 SARS Cov2 >negative  12/16 BCx > positive Staph MRSA  12/16 Strep A > Negative  12/16 Flu A/B > neg 12/16 MRSA PCR > positive  12/16 Body culture left knee > Abundant gram positive cocci  12/18 blood: negative  Antimicrobials:  12/16 vanc x1 12/16 ceftriaxone x1 12/16 Zosyn > 12/16 Daptomycin 12/17  >12/21 vanc 12/22->6 weeks planned thru 08/15/19  Interim history/subjective:  Remains on the ventilator Had issues with persistent atrial fibrillation with tachycardia.  Given amnio x1 Continues on Cardizem drip Agitation when he is weaned on vent  Objective   Blood pressure 128/86, pulse (!) 123, temperature 99.6 F (37.6 C), temperature source Axillary, resp. rate (!) 26, height 6\' 3"  (1.905 m), weight 102.6 kg, SpO2 100 %.    Vent Mode: PRVC FiO2 (%):  [30 %-50 %] 30 % Set Rate:  [16 bmp] 16 bmp Vt Set:  [570 mL-670 mL] 670 mL PEEP:  [5 cmH20] 5 cmH20 Plateau Pressure:  [18 cmH20-30 cmH20] 20 cmH20   Intake/Output Summary (Last 24 hours) at 07/27/2019 0917 Last data filed at 07/27/2019 0900 Gross per 24 hour  Intake 5940.07 ml  Output 2900 ml  Net 3040.07 ml   Filed Weights   07/24/19 0405 07/25/19 0543 07/27/19 0500  Weight: 106.1 kg 103 kg 102.6 kg    Examination: Gen:      No acute distress HEENT:  EOMI, sclera anicteric, ETT Neck:     No masses; no thyromegaly Lungs:    Clear to auscultation bilaterally; normal respiratory effort CV:         Irregular, tachycardia Abd:      + bowel sounds; soft, non-tender; no palpable masses, no distension Ext:    2+ edema; adequate peripheral perfusion Skin:      Warm and dry; no rash Neuro: alert and oriented x 3 Psych: normal mood and affect  Resolved Hospital  Problem list     Assessment & Plan:  Post operative resp insuff:  -reportedly 2/2 mental status Pressure support weans as tolerated  Acute encephalopathy secondary to agitated delirium - multifactorial could also be from polysubstance abuse or etoh withdrawal. +/- sepsis P Continue thiamine, folate Precedex drip PRN fentanyl  MRSA Bacteremia  -resolved bacteremia on 6 weeks vanc per ID -repeat cx negative 12/18 P MRI negative for spinal abscess or osteomyelitis.  -TEE refused by pt and per ID note will not change plan until intervention from CTS needed  and not likely a good candidate for that.    Afib RVR -non-adherent w eliquis outpatient (2/2 financial constraints) P - Per cardiology, not candidate for dccv -Continue Cardizem drip - Add esmolol, may need amiodarone.  Discussed with cardiology - We will hold anticoagulation for 1 more day.  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 Holding a/c at this time post oepratively  Best practice:  Diet: npo, Pain/Anxiety/Delirium protocol (if indicated): per protocol VAP protocol (if indicated): per protocol DVT prophylaxis: held GI prophylaxis: pepcid  Glucose control: monitor Mobility: bed rest Code Status: Full  Family Communication:  Disposition: ICU  Labs   CBC: Recent Labs  Lab 07/23/19 0535 07/24/19 0404 07/25/19 0653 07/26/19 0404 07/26/19 1719 07/27/19 0356  WBC 14.8* 18.5* 17.7* 10.7*  --  10.8*  HGB 8.5* 10.0* 10.5* 9.3* 7.5* 10.1*  HCT 26.4* 31.3* 33.1* 28.4* 22.0* 31.2*  MCV 93.0 91.0 92.7 91.0  --  92.9  PLT PLATELET CLUMPS NOTED ON SMEAR, UNABLE TO ESTIMATE 423* 483* 365  --  [id number]    Basic Metabolic Panel: Recent Labs  Lab 07/23/19 1623 07/24/19 0404 07/25/19 0653 07/26/19 0404 07/26/19 1719 07/27/19 0356  NA 139 141 145 144 145 142  K 3.2* 3.7 3.7 3.3* 3.7 3.7  CL 107 110 112* 113*  --  110  CO2 23 22 20* 22  --  19*  GLUCOSE 152* 145* 144* 144*  --  148*  BUN 13  12 13 15   --  12  CREATININE 0.72 0.76 0.73 0.70  --  0.78  CALCIUM 7.7* 7.7* 7.8* 7.9*  --  7.8*  MG 1.5*  --  1.6* 1.6*  --  1.6*  PHOS  --   --  2.4* 2.8  --   --    GFR: Estimated Creatinine Clearance: 130.5 mL/min (by C-G formula based on SCr of 0.78 mg/dL). Recent Labs  Lab 07/24/19 0404 07/25/19 0653 07/26/19 0404 07/27/19 0356  WBC 18.5* 17.7* 10.7* 10.8*    Liver Function Tests: Recent Labs  Lab 07/23/19 1623  AST 69*  ALT 96*  ALKPHOS 102  BILITOT 1.7*  PROT 6.3*  ALBUMIN 2.1*   No results for input(s): LIPASE, AMYLASE in the last 168 hours. Recent Labs  Lab 07/23/19 1623  AMMONIA 27    ABG    Component Value Date/Time   PHART 7.404 07/26/2019 1719  PCO2ART 33.8 07/26/2019 1719   PO2ART 180.0 (H) 07/26/2019 1719   HCO3 21.2 07/26/2019 1719   TCO2 22 07/26/2019 1719   ACIDBASEDEF 3.0 (H) 07/26/2019 1719   O2SAT 100.0 07/26/2019 1719     Coagulation Profile: No results for input(s): INR, PROTIME in the last 168 hours.  Cardiac Enzymes: No results for input(s): CKTOTAL, CKMB, CKMBINDEX, TROPONINI in the last 168 hours.  HbA1C: Hgb A1c MFr Bld  Date/Time Value Ref Range Status  07/27/2019 03:56 AM 5.2 4.8 - 5.6 % Final    Comment:    (NOTE) Pre diabetes:          5.7%-6.4% Diabetes:              >6.4% Glycemic control for   <7.0% adults with diabetes   07/13/2019 04:08 PM 6.3 (H) 4.8 - 5.6 % Final    Comment:    (NOTE) Pre diabetes:          5.7%-6.4% Diabetes:              >6.4% Glycemic control for   <7.0% adults with diabetes     CBG: Recent Labs  Lab 07/26/19 1147 07/26/19 1745 07/26/19 2000 07/26/19 2319 07/27/19 0316  GLUCAP 127* 128* 126* 125* 132*     Critical care time:    The patient is critically ill with multiple organ system failure and requires high complexity decision making for assessment and support, frequent evaluation and titration of therapies, advanced monitoring, review of radiographic studies and  interpretation of complex data.   Critical Care Time devoted to patient care services, exclusive of separately billable procedures, described in this note is 35 minutes.   Marshell Garfinkel MD [facility] Please see [email or web address] for pager details.  07/27/2019, 9:32 AM

## 2019-07-27 NOTE — Progress Notes (Signed)
Initial Nutrition Assessment  DOCUMENTATION CODES:   Non-severe (moderate) malnutrition in context of social or environmental circumstances  INTERVENTION:   Tube feeding:  -Vital 1.5 @ 40 ml/hr via OGT (960 ml) -60 ml Prostat BID  Provides: 1840 kcals (2247 kcal with propofol), 125 grams protein, 733 ml free water. Meets 100% of needs.   NUTRITION DIAGNOSIS:   Moderate Malnutrition related to social / environmental circumstances as evidenced by percent weight loss, mild muscle depletion.  GOAL:   Patient will meet greater than or equal to 90% of their needs  MONITOR:   Diet advancement, Vent status, Skin, TF tolerance, Weight trends, Labs, I & O's  REASON FOR ASSESSMENT:   Ventilator, Consult Enteral/tube feeding initiation and management  ASSESSMENT:   Patient with PMH significant for HTN, L TKR (2016), and Afib. Presents this admission with septic arthritis of L new and new onset AF with RVR.   12/16- s/p I&D total L knee  12/29- s/p total L knee revision/replacement   Pt discussed during ICU rounds and with RN.   Remains intubated post operatively. Agitated when weaned. Requiring 1 pressor. ETOH/polysub abuse- CIWA protocol.   Records indicate pt weighed 117.9 kg on 2/20 and 102.6 kg this admission (13% wt loss in 8 months, insignificant for time frame). Utilize 102.6 kg to estimate needs.   Patient is currently intubated on ventilator support MV: 10.3 L/min Temp (24hrs), Avg:99.7 F (37.6 C), Min:98.4 F (36.9 C), Max:102.9 F (39.4 C)  Propofol: 15.4 ml/hr- provides 407 kcal from lipids daily   I/O: +477ml since admit UOP: 1,600 ml x 24 hrs    Drips: amiodarone, precedex, Mg sulfate, propofol Medications: colace, folic acid, SS novolog, MVI with minerals, thiamine  Labs: Mg 1.6 (L) CBG 126-153  NUTRITION - FOCUSED PHYSICAL EXAM:    Most Recent Value  Orbital Region  No depletion  Upper Arm Region  No depletion  Thoracic and Lumbar Region  Unable  to assess  Buccal Region  No depletion  Temple Region  Mild depletion  Clavicle Bone Region  No depletion  Clavicle and Acromion Bone Region  No depletion  Scapular Bone Region  Unable to assess  Dorsal Hand  No depletion  Patellar Region  Mild depletion  Anterior Thigh Region  Mild depletion  Posterior Calf Region  No depletion  Edema (RD Assessment)  Mild  Hair  Reviewed  Eyes  Unable to assess  Mouth  Unable to assess  Skin  Reviewed  Nails  Reviewed     Diet Order:   Diet Order    None      EDUCATION NEEDS:   Not appropriate for education at this time  Skin:  Skin Assessment: Skin Integrity Issues: Skin Integrity Issues:: Incisions, Other (Comment) Incisions: L knee/L leg Other: MASD-buttocks  Last BM:  12/28  Height:   Ht Readings from Last 1 Encounters:  07/13/19 6\' 3"  (1.905 m)    Weight:   Wt Readings from Last 1 Encounters:  07/27/19 102.6 kg    Ideal Body Weight:  89.1 kg  BMI:  Body mass index is 28.27 kg/m.  Estimated Nutritional Needs:   Kcal:  2246 kcal  Protein:  125-145 grams  Fluid:  >/= 2.2 L/day   Mariana Single RD, LDN Clinical Nutrition Pager # - [phone]

## 2019-07-27 NOTE — Progress Notes (Signed)
      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 Vancomycin Shortness Of Breath and Rash    [name]     OPAT Orders Discharge antibiotics:   Vancomycin per pharmacy protocol  Aim for Vancomycin trough 15-20 (unless otherwise indicated)   Duration:  6 weeks End Date:  September 06, 2019  [facility] Care Per Protocol:  Labs BI- weekly while on IV antibiotics:  _x_ BMP w GFR _ x_ Vancomycin trough  Labs  weekly while on IV antibiotics: _x_ CBC with differential _x_ CRP _x_ ESR  _x_ Please pull PIC at completion of IV antibiotics __ Please leave PIC in place until doctor has seen patient or been notified  Fax weekly labs to [phone]  Clinic Follow Up Appt:  January 12th with Dr. Tommy Medal  @     [place] 07/27/2019, 11:34 AM

## 2019-07-27 NOTE — Progress Notes (Signed)
Progress Note  Patient Name: Vernon Frye Date of Encounter: 07/27/2019  Primary Cardiologist:   Sinclair Grooms, MD   Subjective   Intubated post revision left TKR   Inpatient Medications    Scheduled Meds: . chlorhexidine gluconate (MEDLINE KIT)  15 mL Mouth Rinse BID  . Chlorhexidine Gluconate Cloth  6 each Topical Daily  . docusate sodium  100 mg Oral BID  . esmolol  500 mcg/kg Intravenous Once  . folic acid  1 mg Oral Daily  . insulin aspart  0-5 Units Subcutaneous QHS  . insulin aspart  0-6 Units Subcutaneous TID WC  . lisinopril  10 mg Oral Daily  . mouth rinse  15 mL Mouth Rinse 10 times per day  . multivitamin with minerals  1 tablet Oral Daily  . QUEtiapine  25 mg Oral BID  . thiamine  100 mg Oral Daily   Or  . thiamine  100 mg Intravenous Daily   Continuous Infusions: . sodium chloride 150 mL/hr at 07/25/19 2252  . sodium chloride 150 mL/hr at 07/27/19 0930  . amiodarone    . amiodarone    . esmolol    . propofol (DIPRIVAN) infusion 25 mcg/kg/min (07/27/19 0900)  . vancomycin Stopped (07/27/19 0724)   PRN Meds: docusate, fentaNYL (SUBLIMAZE) injection, fentaNYL (SUBLIMAZE) injection, haloperidol lactate, LORazepam, menthol-cetylpyridinium **OR** phenol, metoprolol tartrate, midazolam, midazolam, morphine injection, ondansetron **OR** ondansetron (ZOFRAN) IV, oxyCODONE, Resource ThickenUp Clear, sodium chloride flush   Vital Signs    Vitals:   07/27/19 0700 07/27/19 0747 07/27/19 0800 07/27/19 0805  BP: 138/80  128/86 128/86  Pulse: (!) 111  (!) 128 (!) 123  Resp: (!) 21  17 (!) 26  Temp:  99.6 F (37.6 C)    TempSrc:  Axillary    SpO2: 100%  98% 100%  Weight:      Height:        Intake/Output Summary (Last 24 hours) at 07/27/2019 0933 Last data filed at 07/27/2019 0900 Gross per 24 hour  Intake 5940.07 ml  Output 2900 ml  Net 3040.07 ml   Filed Weights   07/24/19 0405 07/25/19 0543 07/27/19 0500  Weight: 106.1 kg 103 kg 102.6 kg     Telemetry    Atrial fibrillation with rapid rates- Personally Reviewed  ECG    NA - Personally Reviewed  Physical Exam   Intubated  Lungs clear Poor dentition No murmur  Left TKR with edema in foot palpable DP     Labs    Chemistry Recent Labs  Lab 07/23/19 1623 07/25/19 0653 07/26/19 0404 07/26/19 1719 07/27/19 0356  NA 139 145 144 145 142  K 3.2* 3.7 3.3* 3.7 3.7  CL 107 112* 113*  --  110  CO2 23 20* 22  --  19*  GLUCOSE 152* 144* 144*  --  148*  BUN '13 13 15  ' --  12  CREATININE 0.72 0.73 0.70  --  0.78  CALCIUM 7.7* 7.8* 7.9*  --  7.8*  PROT 6.3*  --   --   --   --   ALBUMIN 2.1*  --   --   --   --   AST 69*  --   --   --   --   ALT 96*  --   --   --   --   ALKPHOS 102  --   --   --   --   BILITOT 1.7*  --   --   --   --  GFRNONAA >60 >60 >60  --  >60  GFRAA >60 >60 >60  --  >60  ANIONGAP '9 13 9  ' --  13     Hematology Recent Labs  Lab 07/25/19 0653 07/26/19 0404 07/26/19 1719 07/27/19 0356  WBC 17.7* 10.7*  --  10.8*  RBC 3.57* 3.12*  --  3.36*  HGB 10.5* 9.3* 7.5* 10.1*  HCT 33.1* 28.4* 22.0* 31.2*  MCV 92.7 91.0  --  92.9  MCH 29.4 29.8  --  30.1  MCHC 31.7 32.7  --  32.4  RDW 15.4 15.8*  --  16.2*  PLT 483* 365  --  234    Cardiac EnzymesNo results for input(s): TROPONINI in the last 168 hours. No results for input(s): TROPIPOC in the last 168 hours.   BNPNo results for input(s): BNP, PROBNP in the last 168 hours.   DDimer No results for input(s): DDIMER in the last 168 hours.   Radiology    DG Abd 1 View  Result Date: 07/26/2019 CLINICAL DATA:  Orogastric tube placement EXAM: ABDOMEN - 1 VIEW COMPARISON:  None. FINDINGS: There is in nasogastric tube with the tip projecting over the antrum of the stomach. There is no bowel dilatation to suggest obstruction. There is no evidence of pneumoperitoneum, portal venous gas or pneumatosis. There are no pathologic calcifications along the expected course of the ureters. The osseous  structures are unremarkable. IMPRESSION: Nasogastric tube with the tip projecting over the antrum of the stomach. Electronically Signed   By: Kathreen Devoid   On: 07/26/2019 17:11    Cardiac Studies   2D echo 06/2019 IMPRESSIONS   1. Left ventricular ejection fraction, by visual estimation, is 55 to 60%. The left ventricle has normal function. There is mildly increased left ventricular hypertrophy. 2. Left ventricular diastolic function could not be evaluated. 3. Right ventricular volume/pressure overload. 4. The left ventricle demonstrates regional wall motion abnormalities. 5. Global right ventricle has severely reduced systolic function.The right ventricular size is severely enlarged. No increase in right ventricular wall thickness. 6. Left atrial size was normal. 7. Right atrial size was mild-moderately dilated. 8. Presence of pericardial fat pad. 9. Trivial pericardial effusion is present. 10. Mild mitral annular calcification. 11. The mitral valve is degenerative. Trivial mitral valve regurgitation. 12. The tricuspid valve is grossly normal. Tricuspid valve regurgitation is trivial. 13. The aortic valve is tricuspid. Aortic valve regurgitation is not visualized. No evidence of aortic valve sclerosis or stenosis. 14. The pulmonic valve was grossly normal. Pulmonic valve regurgitation is not visualized. 15. TR signal is inadequate for assessing pulmonary artery systolic pressure. 16. The inferior vena cava is normal in size with greater than 50% respiratory variability, suggesting right atrial pressure of 3 mmHg. 17. No prior Echocardiogram.  Patient Profile     58 y.o. male with recent septic knee (prosthetic joint infection), MRSA bacteremia who is being followed for post-op afib with RVR  Assessment & Plan    ATRIAL FIB:   Rate up due to delirium vent surgery and anemia Since intubated will use esmolol and amiodarone for rate control Would be cautious about resuming  heparin as it was stopped prior to redo surgery due to bleeding into joint   TKR:  Revision LE edema in binder antibiotics per ortho DVT prophylaxis   For questions or updates, please contact [facility] Please consult [email or web address] for contact info under Cardiology/STEMI.   Signed, Jenkins Rouge, MD  07/27/2019, 9:33 AM

## 2019-07-27 NOTE — Progress Notes (Signed)
PT Cancellation Note  Patient Details Name: Vernon Frye MRN: [id number] DOB: 03/15/1961   Cancelled Treatment:    Reason Eval/Treat Not Completed: Medical issues which prohibited therapy. Per discussion with RN pt is agitated and minimally able to follow commands at this time. Pt also being managed for tachycardia with bouts of agitation. RN requesting deferral of PT services at this time. Pt will follow up when pt is more medically able to participate in skilled PT intervention.   Zenaida Niece 07/27/2019, 1:23 PM

## 2019-07-27 NOTE — Progress Notes (Signed)
Subjective:   on the ventilator and sedated  Antibiotics:  Anti-infectives (From admission, onward)   Start     Dose/Rate Route Frequency Ordered Stop   07/26/19 1505  tobramycin (NEBCIN) powder  Status:  Discontinued       As needed 07/26/19 1506 07/26/19 1603   07/26/19 1504  vancomycin (VANCOCIN) powder  Status:  Discontinued       As needed 07/26/19 1505 07/26/19 1603   07/25/19 1500  vancomycin (VANCOREADY) IVPB 1250 mg/250 mL     1,250 mg 62.5 mL/hr over 240 Minutes Intravenous Every 12 hours 07/25/19 1443     07/22/19 1000  vancomycin (VANCOREADY) IVPB 1500 mg/300 mL  Status:  Discontinued     1,500 mg 75 mL/hr over 240 Minutes Intravenous Every 12 hours 07/22/19 0237 07/25/19 1443   07/19/19 1300  vancomycin (VANCOCIN) IVPB 1000 mg/200 mL premix  Status:  Discontinued     1,000 mg 66.7 mL/hr over 180 Minutes Intravenous Every 12 hours 07/19/19 1145 07/22/19 0236   07/14/19 2000  DAPTOmycin (CUBICIN) 1,000 mg in sodium chloride 0.9 % IVPB  Status:  Discontinued     1,000 mg 240 mL/hr over 30 Minutes Intravenous Daily 07/14/19 0807 07/19/19 1145   07/13/19 2119  vancomycin (VANCOCIN) powder  Status:  Discontinued       As needed 07/13/19 2119 07/13/19 2154   07/13/19 1400  DAPTOmycin (CUBICIN) 944 mg in sodium chloride 0.9 % IVPB  Status:  Discontinued     8 mg/kg  118 kg 237.8 mL/hr over 30 Minutes Intravenous Daily 07/13/19 1320 07/14/19 0807   07/13/19 1015  piperacillin-tazobactam (ZOSYN) IVPB 3.375 g  Status:  Discontinued     3.375 g 12.5 mL/hr over 240 Minutes Intravenous Every 8 hours 07/13/19 1002 07/13/19 1535   07/13/19 0400  cefTRIAXone (ROCEPHIN) 2 g in sodium chloride 0.9 % 100 mL IVPB  Status:  Discontinued     2 g 200 mL/hr over 30 Minutes Intravenous Every 24 hours 07/13/19 0337 07/13/19 0951   07/13/19 0400  vancomycin (VANCOCIN) 2,000 mg in sodium chloride 0.9 % 500 mL IVPB     2,000 mg 250 mL/hr over 120 Minutes Intravenous  Once 07/13/19  0339 07/13/19 0707   07/13/19 0339  vancomycin variable dose per unstable renal function (pharmacist dosing)  Status:  Discontinued      Does not apply See admin instructions 07/13/19 0340 07/13/19 0911      Medications: Scheduled Meds: . chlorhexidine gluconate (MEDLINE KIT)  15 mL Mouth Rinse BID  . Chlorhexidine Gluconate Cloth  6 each Topical Daily  . docusate sodium  100 mg Oral BID  . folic acid  1 mg Oral Daily  . insulin aspart  0-5 Units Subcutaneous QHS  . insulin aspart  0-6 Units Subcutaneous TID WC  . lisinopril  10 mg Oral Daily  . mouth rinse  15 mL Mouth Rinse 10 times per day  . multivitamin with minerals  1 tablet Oral Daily  . QUEtiapine  25 mg Oral BID  . thiamine  100 mg Oral Daily   Or  . thiamine  100 mg Intravenous Daily   Continuous Infusions: . amiodarone 60 mg/hr (07/27/19 1029)  . amiodarone    . dexmedetomidine (PRECEDEX) IV infusion 0.4 mcg/kg/hr (07/27/19 1022)  . esmolol 25 mcg/kg/min (07/27/19 1117)  . magnesium sulfate bolus IVPB    . propofol (DIPRIVAN) infusion 25 mcg/kg/min (07/27/19 0900)  . vancomycin Stopped (07/27/19 0724)  PRN Meds:.docusate, fentaNYL (SUBLIMAZE) injection, fentaNYL (SUBLIMAZE) injection, haloperidol lactate, LORazepam, menthol-cetylpyridinium **OR** phenol, metoprolol tartrate, midazolam, midazolam, morphine injection, ondansetron **OR** ondansetron (ZOFRAN) IV, oxyCODONE, Resource ThickenUp Clear, sodium chloride flush    Objective: Weight change:   Intake/Output Summary (Last 24 hours) at 07/27/2019 1128 Last data filed at 07/27/2019 0900 Gross per 24 hour  Intake 5940.07 ml  Output 2900 ml  Net 3040.07 ml   Blood pressure 128/86, pulse (!) 123, temperature 99.6 F (37.6 C), temperature source Axillary, resp. rate (!) 26, height _0  (1.905 m), weight 102.6 kg, SpO2 100 %. Temp:  [98.4 F (36.9 C)-102.9 F (39.4 C)] 99.6 F (37.6 C) (12/30 0747) Pulse Rate:  [84-135] 123 (12/30 0805) Resp:  [16-31] 26  (12/30 0805) BP: (101-163)/(73-128) 128/86 (12/30 0805) SpO2:  [95 %-100 %] 100 % (12/30 0805) FiO2 (%):  [30 %-50 %] 30 % (12/30 0805) Weight:  [102.6 kg] 102.6 kg (12/30 0500)  Physical Exam: General: Intubated sedated HEENT: anicteric sclera, EOMI CVS tachycardic no murmurs gallops rubs heard Chest: Coarse breath sounds Abdomen: soft non-distended,  Extremities: Left knee with bandage his legs have fairly sick significant 3+ pitting edema skin: no rashes Neuro: nonfocal  CBC:    BMET Recent Labs    07/26/19 0404 07/26/19 1719 07/27/19 0356  NA 144 145 142  K 3.3* 3.7 3.7  CL 113*  --  110  CO2 22  --  19*  GLUCOSE 144*  --  148*  BUN 15  --  12  CREATININE 0.70  --  0.78  CALCIUM 7.9*  --  7.8*     Liver Panel  No results for input(s): PROT, ALBUMIN, AST, ALT, ALKPHOS, BILITOT, BILIDIR, IBILI in the last 72 hours.     Sedimentation Rate No results for input(s): ESRSEDRATE in the last 72 hours. C-Reactive Protein No results for input(s): CRP in the last 72 hours.  Micro Results: Recent Results (from the past 720 hour(s))  Blood Culture (routine x 2)     Status: Abnormal   Collection Time: 07/13/19  1:10 AM   Specimen: BLOOD  Result Value Ref Range Status   Specimen Description BLOOD RIGHT ANTECUBITAL  Final   Special Requests   Final    BOTTLES DRAWN AEROBIC AND ANAEROBIC Blood Culture adequate volume   Culture  Setup Time   Final    GRAM POSITIVE COCCI IN CLUSTERS IN BOTH AEROBIC AND ANAEROBIC BOTTLES CRITICAL RESULT CALLED TO, READ BACK BY AND VERIFIED WITH: Salome Holmes PHARMD 1510 07/13/19 A BROWNING    Culture (A)  Final    STAPHYLOCOCCUS AUREUS SUSCEPTIBILITIES PERFORMED ON PREVIOUS CULTURE WITHIN THE LAST 5 DAYS. Performed at [facility], [place] [address] [place], [place] [address]    Report Status 07/15/2019 FINAL  Final  Blood Culture (routine x 2)     Status: Abnormal   Collection Time: 07/13/19  1:14 AM   Specimen: BLOOD  Result  Value Ref Range Status   Specimen Description BLOOD LEFT ANTECUBITAL  Final   Special Requests   Final    BOTTLES DRAWN AEROBIC AND ANAEROBIC Blood Culture adequate volume   Culture  Setup Time   Final    GRAM POSITIVE COCCI IN CLUSTERS IN BOTH AEROBIC AND ANAEROBIC BOTTLES CRITICAL RESULT CALLED TO, READ BACK BY AND VERIFIED WITH: Salome Holmes [facility] [id number] 07/13/19 A BROWNING Performed at [facility], [place] [address], [place], [place] [address]    Culture METHICILLIN RESISTANT STAPHYLOCOCCUS AUREUS (A)  Final   Report Status 07/15/2019 FINAL  Final   Organism ID, Bacteria METHICILLIN RESISTANT STAPHYLOCOCCUS AUREUS  Final      Susceptibility   Methicillin resistant staphylococcus aureus - MIC*    CIPROFLOXACIN >=8 RESISTANT Resistant     ERYTHROMYCIN >=8 RESISTANT Resistant     GENTAMICIN <=0.5 SENSITIVE Sensitive     OXACILLIN >=4 RESISTANT Resistant     TETRACYCLINE <=1 SENSITIVE Sensitive     VANCOMYCIN <=0.5 SENSITIVE Sensitive     TRIMETH/SULFA <=10 SENSITIVE Sensitive     CLINDAMYCIN <=0.25 SENSITIVE Sensitive     RIFAMPIN <=0.5 SENSITIVE Sensitive     Inducible Clindamycin NEGATIVE Sensitive     * METHICILLIN RESISTANT STAPHYLOCOCCUS AUREUS  Blood Culture ID Panel (Reflexed)     Status: Abnormal   Collection Time: 07/13/19  1:14 AM  Result Value Ref Range Status   Enterococcus species NOT DETECTED NOT DETECTED Final   Listeria monocytogenes NOT DETECTED NOT DETECTED Final   Staphylococcus species DETECTED (A) NOT DETECTED Final    Comment: CRITICAL RESULT CALLED TO, READ BACK BY AND VERIFIED WITH: Salome Holmes PHARMD 1510 07/13/19 A BROWNING    Staphylococcus aureus (BCID) DETECTED (A) NOT DETECTED Final    Comment: Methicillin (oxacillin)-resistant Staphylococcus aureus (MRSA). MRSA is predictably resistant to beta-lactam antibiotics (except ceftaroline). Preferred therapy is vancomycin unless clinically contraindicated. Patient requires contact precautions if    hospitalized. CRITICAL RESULT CALLED TO, READ BACK BY AND VERIFIED WITH: Salome Holmes PHARMD [id number] 07/13/19 A BROWNING    Methicillin resistance DETECTED (A) NOT DETECTED Final    Comment: CRITICAL RESULT CALLED TO, READ BACK BY AND VERIFIED WITH: Salome Holmes PHARMD 1510 07/13/19 A BROWNING    Streptococcus species NOT DETECTED NOT DETECTED Final   Streptococcus agalactiae NOT DETECTED NOT DETECTED Final   Streptococcus pneumoniae NOT DETECTED NOT DETECTED Final   Streptococcus pyogenes NOT DETECTED NOT DETECTED Final   Acinetobacter baumannii NOT DETECTED NOT DETECTED Final   Enterobacteriaceae species NOT DETECTED NOT DETECTED Final   Enterobacter cloacae complex NOT DETECTED NOT DETECTED Final   Escherichia coli NOT DETECTED NOT DETECTED Final   Klebsiella oxytoca NOT DETECTED NOT DETECTED Final   Klebsiella pneumoniae NOT DETECTED NOT DETECTED Final   Proteus species NOT DETECTED NOT DETECTED Final   Serratia marcescens NOT DETECTED NOT DETECTED Final   Haemophilus influenzae NOT DETECTED NOT DETECTED Final   Neisseria meningitidis NOT DETECTED NOT DETECTED Final   Pseudomonas aeruginosa NOT DETECTED NOT DETECTED Final   Candida albicans NOT DETECTED NOT DETECTED Final   Candida glabrata NOT DETECTED NOT DETECTED Final   Candida krusei NOT DETECTED NOT DETECTED Final   Candida parapsilosis NOT DETECTED NOT DETECTED Final   Candida tropicalis NOT DETECTED NOT DETECTED Final    Comment: Performed at [facility], [place]. [address] [place], [place] [address]  SARS CORONAVIRUS 2 (TAT 6-24 HRS) Nasopharyngeal Nasopharyngeal Swab     Status: None   Collection Time: 07/13/19  2:57 AM   Specimen: Nasopharyngeal Swab  Result Value Ref Range Status   SARS Coronavirus 2 NEGATIVE NEGATIVE Final    Comment: (NOTE) SARS-CoV-2 target nucleic acids are NOT DETECTED. The SARS-CoV-2 RNA is generally detectable in upper and lower respiratory specimens during the acute phase of infection.  Negative results do not preclude SARS-CoV-2 infection, do not rule out co-infections with other pathogens, and should not be used as the sole basis for treatment or other patient management decisions. Negative results must be combined with clinical  observations, patient history, and epidemiological information. The expected result is Negative. Fact Sheet for Patients: SugarRoll.be Fact Sheet for Healthcare Providers: https://www.woods-mathews.com/ This test is not yet approved or cleared by the [place] FDA and  has been authorized for detection and/or diagnosis of SARS-CoV-2 by FDA under an Emergency Use Authorization (EUA). This EUA will remain  in effect (meaning this test can be used) for the duration of the COVID-19 declaration under Section 56 4(b)(1) of the Act, 21 U.S.C. section 360bbb-3(b)(1), unless the authorization is terminated or revoked sooner. Performed at [facility], [place] [address], [place], [place] [address]   Respiratory Panel by RT PCR (Flu A&B, Covid) - Nasopharyngeal Swab     Status: None   Collection Time: 07/13/19  6:56 AM   Specimen: Nasopharyngeal Swab  Result Value Ref Range Status   SARS Coronavirus 2 by RT PCR NEGATIVE NEGATIVE Final    Comment: (NOTE) SARS-CoV-2 target nucleic acids are NOT DETECTED. The SARS-CoV-2 RNA is generally detectable in upper respiratoy specimens during the acute phase of infection. The lowest concentration of SARS-CoV-2 viral copies this assay can detect is 131 copies/mL. A negative result does not preclude SARS-Cov-2 infection and should not be used as the sole basis for treatment or other patient management decisions. A negative result may occur with  improper specimen collection/handling, submission of specimen other than nasopharyngeal swab, presence of viral mutation(s) within the areas targeted by this assay, and inadequate number of viral copies (<131 copies/mL). A  negative result must be combined with clinical observations, patient history, and epidemiological information. The expected result is Negative. Fact Sheet for Patients:  PinkCheek.be Fact Sheet for Healthcare Providers:  GravelBags.it This test is not yet ap proved or cleared by the [place] FDA and  has been authorized for detection and/or diagnosis of SARS-CoV-2 by FDA under an Emergency Use Authorization (EUA). This EUA will remain  in effect (meaning this test can be used) for the duration of the COVID-19 declaration under Section 564(b)(1) of the Act, 21 U.S.C. section 360bbb-3(b)(1), unless the authorization is terminated or revoked sooner.    Influenza A by PCR NEGATIVE NEGATIVE Final   Influenza B by PCR NEGATIVE NEGATIVE Final    Comment: (NOTE) The Xpert Xpress SARS-CoV-2/FLU/RSV assay is intended as an aid in  the diagnosis of influenza from Nasopharyngeal swab specimens and  should not be used as a sole basis for treatment. Nasal washings and  aspirates are unacceptable for Xpert Xpress SARS-CoV-2/FLU/RSV  testing. Fact Sheet for Patients: PinkCheek.be Fact Sheet for Healthcare Providers: GravelBags.it This test is not yet approved or cleared by the [place] FDA and  has been authorized for detection and/or diagnosis of SARS-CoV-2 by  FDA under an Emergency Use Authorization (EUA). This EUA will remain  in effect (meaning this test can be used) for the duration of the  Covid-19 declaration under Section 564(b)(1) of the Act, 21  U.S.C. section 360bbb-3(b)(1), unless the authorization is  terminated or revoked. Performed at [facility], [place] [address], [place], [place] [address]   Urine culture     Status: None   Collection Time: 07/13/19  8:18 AM   Specimen: In/Out Cath Urine  Result Value Ref Range Status   Specimen Description IN/OUT  CATH URINE  Final   Special Requests NONE  Final   Culture   Final    NO GROWTH Performed at [facility], [place] [address], [place],  [address]  Report Status 07/14/2019 FINAL  Final  MRSA PCR Screening     Status: Abnormal   Collection Time: 07/13/19 10:59 AM   Specimen: Nasal Mucosa; Nasopharyngeal  Result Value Ref Range Status   MRSA by PCR POSITIVE (A) NEGATIVE Final    Comment:        The GeneXpert MRSA Assay (FDA approved for NASAL specimens only), is one component of a comprehensive MRSA colonization surveillance program. It is not intended to diagnose MRSA infection nor to guide or monitor treatment for MRSA infections. RESULT CALLED TO, READ BACK BY AND VERIFIED WITH: B. Adonis Brook RN 13:15 07/13/19 ([email or web address]) Performed at [facility], [place] [address] [place], [place] [address]   Body fluid culture     Status: None   Collection Time: 07/13/19  1:29 PM   Specimen: Synovium; Body Fluid  Result Value Ref Range Status   Specimen Description SYNOVIAL KNEE LEFT  Final   Special Requests NONE  Final   Gram Stain   Final    ABUNDANT WBC PRESENT, PREDOMINANTLY MONONUCLEAR ABUNDANT GRAM POSITIVE COCCI Performed at [facility], [place] [address], [place], [place] [address]    Culture   Final    ABUNDANT METHICILLIN RESISTANT STAPHYLOCOCCUS AUREUS   Report Status 07/15/2019 FINAL  Final   Organism ID, Bacteria METHICILLIN RESISTANT STAPHYLOCOCCUS AUREUS  Final      Susceptibility   Methicillin resistant staphylococcus aureus - MIC*    CIPROFLOXACIN >=8 RESISTANT Resistant     ERYTHROMYCIN >=8 RESISTANT Resistant     GENTAMICIN <=0.5 SENSITIVE Sensitive     OXACILLIN >=4 RESISTANT Resistant     TETRACYCLINE <=1 SENSITIVE Sensitive     VANCOMYCIN <=0.5 SENSITIVE Sensitive     TRIMETH/SULFA <=10 SENSITIVE Sensitive     CLINDAMYCIN <=0.25 SENSITIVE Sensitive     RIFAMPIN <=0.5 SENSITIVE Sensitive     Inducible Clindamycin NEGATIVE Sensitive     *  ABUNDANT METHICILLIN RESISTANT STAPHYLOCOCCUS AUREUS  Group A Strep by PCR     Status: None   Collection Time: 07/13/19  4:08 PM   Specimen: Throat; Sterile Swab  Result Value Ref Range Status   Group A Strep by PCR NOT DETECTED NOT DETECTED Final    Comment: Performed at [facility], [place] [address] [place], [place] [address]  Culture, blood (routine x 2)     Status: Abnormal   Collection Time: 07/13/19  5:14 PM   Specimen: BLOOD RIGHT HAND  Result Value Ref Range Status   Specimen Description BLOOD RIGHT HAND  Final   Special Requests   Final    BOTTLES DRAWN AEROBIC ONLY Blood Culture adequate volume   Culture  Setup Time   Final    GRAM POSITIVE COCCI AEROBIC BOTTLE ONLY CRITICAL VALUE NOTED.  VALUE IS CONSISTENT WITH PREVIOUSLY REPORTED AND CALLED VALUE.    Culture (A)  Final    STAPHYLOCOCCUS AUREUS SUSCEPTIBILITIES PERFORMED ON PREVIOUS CULTURE WITHIN THE LAST 5 DAYS. Performed at [facility], [place] [address], [place], [place] [address]    Report Status 07/17/2019 FINAL  Final  Culture, blood (routine x 2)     Status: Abnormal   Collection Time: 07/13/19  5:14 PM   Specimen: BLOOD RIGHT HAND  Result Value Ref Range Status   Specimen Description BLOOD RIGHT HAND  Final   Special Requests   Final    BOTTLES DRAWN AEROBIC ONLY Blood Culture adequate volume   Culture  Setup Time   Final  GRAM POSITIVE COCCI AEROBIC BOTTLE ONLY CRITICAL VALUE NOTED.  VALUE IS CONSISTENT WITH PREVIOUSLY REPORTED AND CALLED VALUE.    Culture (A)  Final    STAPHYLOCOCCUS AUREUS SUSCEPTIBILITIES PERFORMED ON PREVIOUS CULTURE WITHIN THE LAST 5 DAYS. Performed at [facility], [place] [address], [place], [place] [address]    Report Status 07/18/2019 FINAL  Final  Culture, blood (routine x 2)     Status: Abnormal   Collection Time: 07/14/19  9:50 AM   Specimen: BLOOD  Result Value Ref Range Status   Specimen Description BLOOD RIGHT ANTECUBITAL  Final   Special Requests    Final    BOTTLES DRAWN AEROBIC ONLY Blood Culture adequate volume   Culture  Setup Time   Final    GRAM POSITIVE COCCI AEROBIC BOTTLE ONLY CRITICAL VALUE NOTED.  VALUE IS CONSISTENT WITH PREVIOUSLY REPORTED AND CALLED VALUE.    Culture (A)  Final    STAPHYLOCOCCUS AUREUS SUSCEPTIBILITIES PERFORMED ON PREVIOUS CULTURE WITHIN THE LAST 5 DAYS. Performed at [facility], [place] [address], [place], [place] [address]    Report Status 07/17/2019 FINAL  Final  Culture, blood (routine x 2)     Status: None   Collection Time: 07/14/19  9:56 AM   Specimen: BLOOD RIGHT HAND  Result Value Ref Range Status   Specimen Description BLOOD RIGHT HAND  Final   Special Requests   Final    BOTTLES DRAWN AEROBIC ONLY Blood Culture results may not be optimal due to an inadequate volume of blood received in culture bottles   Culture   Final    NO GROWTH 5 DAYS Performed at [facility], [place] [address], [place], [place] [address]    Report Status 07/19/2019 FINAL  Final  Culture, blood (routine x 2)     Status: None   Collection Time: 07/15/19 12:21 PM   Specimen: BLOOD RIGHT HAND  Result Value Ref Range Status   Specimen Description BLOOD RIGHT HAND  Final   Special Requests   Final    BOTTLES DRAWN AEROBIC ONLY Blood Culture results may not be optimal due to an inadequate volume of blood received in culture bottles   Culture   Final    NO GROWTH 5 DAYS Performed at [facility], [place] [address], [place], [place] [address]    Report Status 07/20/2019 FINAL  Final  Culture, blood (routine x 2)     Status: None   Collection Time: 07/15/19  1:20 PM   Specimen: BLOOD RIGHT ARM  Result Value Ref Range Status   Specimen Description BLOOD RIGHT ARM  Final   Special Requests   Final    BOTTLES DRAWN AEROBIC ONLY Blood Culture adequate volume   Culture   Final    NO GROWTH 5 DAYS Performed at [facility], [place] [address] [place],  [address]    Report Status 07/20/2019 FINAL   Final    Studies/Results: DG Abd 1 View  Result Date: 07/26/2019 CLINICAL DATA:  Orogastric tube placement EXAM: ABDOMEN - 1 VIEW COMPARISON:  None. FINDINGS: There is in nasogastric tube with the tip projecting over the antrum of the stomach. There is no bowel dilatation to suggest obstruction. There is no evidence of pneumoperitoneum, portal venous gas or pneumatosis. There are no pathologic calcifications along the expected course of the ureters. The osseous structures are unremarkable. IMPRESSION: Nasogastric tube with the tip projecting over the antrum of the stomach. Electronically Signed  By: Kathreen Devoid   On: 07/26/2019 17:11      Assessment/Plan:  INTERVAL HISTORY:   He has now undergone resection arthroplasty with placement of antibiotic spacer  Principal Problem:   MRSA bacteremia Active Problems:   Septic joint ([place])   Atrial fibrillation with rapid ventricular response (HCC)   AKI (acute kidney injury) ([place])   Hyponatremia   Severe sepsis (HCC)   Severe sepsis with acute organ dysfunction (HCC)   Respiratory insufficiency   Acute pain of left knee   Prosthetic joint infection (HCC)   Tachypnea   Acute bilateral low back pain without sciatica   Left arm swelling   Red man syndrome   Acute metabolic encephalopathy   S/P revision of total knee    Vernon Frye is a 58 y.o. 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 He has now undergone a repeat trip to the OR with [name] who has performed resection arthroplasty with placement of antibiotic spacer  #1         Antimicrobial Management Team Staphylococcus aureus bacteremia   Staphylococcus aureus bacteremia (SAB) is associated with a high rate of complications and mortality.  Specific aspects of clinical management are critical to optimizing the outcome of  patients with SAB.  Therefore, the [facility] Antimicrobial Management Team [facility]) has initiated an intervention aimed at improving the management of SAB at [facility].  To do so, Infectious Diseases physicians are providing an evidence-based consult for the management of all patients with SAB.     Yes No Comments  Perform follow-up blood cultures (even if the patient is afebrile) to ensure clearance of bacteremia _0  _1   I repeated blood cultures after his midline was removed and no growth at 5 days  Remove vascular catheter and obtain follow-up blood cultures after the removal of the catheter _2  _3  Not an issue now  Perform echocardiography to evaluate for endocarditis (transthoracic ECHO is 40-50% sensitive, TEE is > 90% sensitive) _4  _5  ? Will he have TEE or not seems like while intubated would be safe time though ? Someone would consent on his behalf as he was refusing before  Consult electrophysiologist to evaluate implanted cardiac device (pacemaker, ICD) _6  _7    Ensure source control _8  _9  Have all abscesses been drained effectively? Have deep seeded infections (septic joints or osteomyelitis) had appropriate surgical debridement?  Knee seems to have been the source  Investigate for "metastatic" sites of infection _10  _11  Does the patient have ANY symptom or physical exam finding that would suggest a deeper infection (back or neck pain that may be suggestive of vertebral osteomyelitis or epidural abscess, muscle pain that could be a symptom of pyomyositis)?  Keep in mind that for deep seeded infections MRI imaging with contrast is preferred rather than other often insensitive tests such as plain x-rays, especially early in a patient's presentation.  MRI spine without infeciton  Change antibiotic therapy to vancomcyin  (challenge) _12  _13  Beta-lactam antibiotics are preferred for MSSA due to higher cure rates.   If on Vancomycin, goal trough should be 15 - 20 mcg/mL  Estimated duration of IV  antibiotic therapy: 6 weeks total but with new end date _14  _15  Consult case management for probably prolonged outpatient IV antibiotic therapy   #2 prosthetic joint infection:   Now there is prosthetic material has been removed we  can aim for cure with 6 weeks of antibiotics we will start the clock over with day 1 being postop day 1  TEE will ONLY  Change management if he needed CVTS surgery and I very much doubt he would be a good operative candidate.  I will put in NEW  OPAT orders and outline antibiotic plan.  IF he gets TEE and has endocarditis call us back.  Otherwise we will plan on seeing him in clinic.     LOS: 14 days   Alcide Evener 07/27/2019, 11:28 AM

## 2019-07-28 ENCOUNTER — Inpatient Hospital Stay (HOSPITAL_COMMUNITY): Payer: Self-pay

## 2019-07-28 LAB — CBC
HCT: 26.3 % — ABNORMAL LOW (ref 39.0–52.0)
HCT: 28 % — ABNORMAL LOW (ref 39.0–52.0)
Hemoglobin: 8.3 g/dL — ABNORMAL LOW (ref 13.0–17.0)
Hemoglobin: 8.7 g/dL — ABNORMAL LOW (ref 13.0–17.0)
MCH: 29.3 pg (ref 26.0–34.0)
MCH: 28.7 pg (ref 26.0–34.0)
MCHC: 31.6 g/dL (ref 30.0–36.0)
MCHC: 31.1 g/dL (ref 30.0–36.0)
MCV: 92.9 fL (ref 80.0–100.0)
MCV: 92.4 fL (ref 80.0–100.0)
Platelets: 238 10*3/uL (ref 150–400)
Platelets: 228 10*3/uL (ref 150–400)
RBC: 2.83 MIL/uL — ABNORMAL LOW (ref 4.22–5.81)
RBC: 3.03 MIL/uL — ABNORMAL LOW (ref 4.22–5.81)
RDW: 16 % — ABNORMAL HIGH (ref 11.5–15.5)
RDW: 15.9 % — ABNORMAL HIGH (ref 11.5–15.5)
WBC: 8.2 10*3/uL (ref 4.0–10.5)
WBC: 8.2 10*3/uL (ref 4.0–10.5)
nRBC: 0 % (ref 0.0–0.2)
nRBC: 0 % (ref 0.0–0.2)

## 2019-07-28 LAB — BASIC METABOLIC PANEL
Anion gap: 9 (ref 5–15)
BUN: 18 mg/dL (ref 6–20)
CO2: 23 mmol/L (ref 22–32)
Calcium: 7.9 mg/dL — ABNORMAL LOW (ref 8.9–10.3)
Chloride: 110 mmol/L (ref 98–111)
Creatinine, Ser: 0.82 mg/dL (ref 0.61–1.24)
GFR calc Af Amer: >60 mL/min (ref >60)
GFR calc non Af Amer: >60 mL/min (ref >60)
Glucose, Bld: 194 mg/dL — ABNORMAL HIGH (ref 70–99)
Potassium: 3.3 mmol/L — ABNORMAL LOW (ref 3.5–5.1)
Sodium: 142 mmol/L (ref 135–145)

## 2019-07-28 LAB — COMPREHENSIVE METABOLIC PANEL
ALT: 76 U/L — ABNORMAL HIGH (ref 0–44)
AST: 54 U/L — ABNORMAL HIGH (ref 15–41)
Albumin: 1.7 g/dL — ABNORMAL LOW (ref 3.5–5.0)
Alkaline Phosphatase: 109 U/L (ref 38–126)
Anion gap: 8 (ref 5–15)
BUN: 18 mg/dL (ref 6–20)
CO2: 22 mmol/L (ref 22–32)
Calcium: 8 mg/dL — ABNORMAL LOW (ref 8.9–10.3)
Chloride: 114 mmol/L — ABNORMAL HIGH (ref 98–111)
Creatinine, Ser: 0.84 mg/dL (ref 0.61–1.24)
GFR calc Af Amer: >60 mL/min (ref >60)
GFR calc non Af Amer: >60 mL/min (ref >60)
Glucose, Bld: 166 mg/dL — ABNORMAL HIGH (ref 70–99)
Potassium: 3.6 mmol/L (ref 3.5–5.1)
Sodium: 144 mmol/L (ref 135–145)
Total Bilirubin: 0.9 mg/dL (ref 0.3–1.2)
Total Protein: 5.8 g/dL — ABNORMAL LOW (ref 6.5–8.1)

## 2019-07-28 LAB — GLUCOSE, CAPILLARY
Glucose-Capillary: 162 mg/dL — ABNORMAL HIGH (ref 70–99)
Glucose-Capillary: 172 mg/dL — ABNORMAL HIGH (ref 70–99)
Glucose-Capillary: 196 mg/dL — ABNORMAL HIGH (ref 70–99)
Glucose-Capillary: 151 mg/dL — ABNORMAL HIGH (ref 70–99)
Glucose-Capillary: 154 mg/dL — ABNORMAL HIGH (ref 70–99)
Glucose-Capillary: 162 mg/dL — ABNORMAL HIGH (ref 70–99)

## 2019-07-28 LAB — MAGNESIUM
Magnesium: 1.7 mg/dL (ref 1.7–2.4)
Magnesium: 1.7 mg/dL (ref 1.7–2.4)

## 2019-07-28 LAB — VANCOMYCIN, PEAK
Vancomycin Pk: 45 ug/mL — ABNORMAL HIGH (ref 30–40)
Vancomycin Pk: 20 ug/mL — ABNORMAL LOW (ref 30–40)

## 2019-07-28 LAB — VANCOMYCIN, TROUGH: Vancomycin Tr: 13 ug/mL — ABNORMAL LOW (ref 15–20)

## 2019-07-28 LAB — PHOSPHORUS
Phosphorus: 3.1 mg/dL (ref 2.5–4.6)
Phosphorus: 3.1 mg/dL (ref 2.5–4.6)

## 2019-07-28 MED ORDER — LORAZEPAM 1 MG PO TABS: 1.0000 mg | ORAL_TABLET | ORAL | Status: AC | PRN

## 2019-07-28 MED ORDER — THIAMINE HCL 100 MG PO TABS
100.0000 mg | ORAL_TABLET | Freq: Every day | ORAL | Status: DC
  Administered 2019-07-28 – 2019-07-31 (×4): 100 mg
  Filled 2019-07-28 (×4): qty 1

## 2019-07-28 MED ORDER — ADULT MULTIVITAMIN W/MINERALS CH
1.0000 | ORAL_TABLET | Freq: Every day | ORAL | Status: DC
  Administered 2019-07-28 – 2019-07-31 (×4): 1
  Filled 2019-07-28 (×4): qty 1

## 2019-07-28 MED ORDER — THIAMINE HCL 100 MG/ML IJ SOLN
100.0000 mg | Freq: Every day | INTRAMUSCULAR | Status: DC
  Filled 2019-07-28: qty 2

## 2019-07-28 MED ORDER — LORAZEPAM 2 MG/ML IJ SOLN
1.0000 mg | INTRAMUSCULAR | Status: AC | PRN
  Administered 2019-07-28 – 2019-07-29 (×2): 4 mg via INTRAVENOUS
  Filled 2019-07-28: qty 1
  Filled 2019-07-28 (×2): qty 2

## 2019-07-28 MED ORDER — MAGNESIUM SULFATE 4 GM/100ML IV SOLN
4.0000 g | Freq: Once | INTRAVENOUS | Status: AC
  Administered 2019-07-28: 4 g via INTRAVENOUS
  Filled 2019-07-28: qty 100

## 2019-07-28 MED ORDER — POTASSIUM CHLORIDE 20 MEQ/15ML (10%) PO SOLN
20.0000 meq | ORAL | Status: AC
  Administered 2019-07-28 (×2): 20 meq
  Filled 2019-07-28 (×2): qty 15

## 2019-07-28 MED ORDER — THIAMINE HCL 100 MG PO TABS: 100.0000 mg | ORAL_TABLET | Freq: Every day | ORAL | Status: DC

## 2019-07-28 MED ORDER — THIAMINE HCL 100 MG/ML IJ SOLN: 100.0000 mg | Freq: Every day | INTRAMUSCULAR | Status: DC

## 2019-07-28 MED ORDER — DIAZEPAM 5 MG PO TABS
5.0000 mg | ORAL_TABLET | Freq: Two times a day (BID) | ORAL | Status: DC
  Administered 2019-07-28 – 2019-07-31 (×7): 5 mg
  Filled 2019-07-28 (×7): qty 1

## 2019-07-28 MED ORDER — PANTOPRAZOLE SODIUM 40 MG IV SOLR
40.0000 mg | INTRAVENOUS | Status: DC
  Administered 2019-07-28 – 2019-08-04 (×8): 40 mg via INTRAVENOUS
  Filled 2019-07-28 (×8): qty 40

## 2019-07-28 MED ORDER — FOLIC ACID 1 MG PO TABS
1.0000 mg | ORAL_TABLET | Freq: Every day | ORAL | Status: DC
  Administered 2019-07-28 – 2019-07-31 (×4): 1 mg
  Filled 2019-07-28 (×4): qty 1

## 2019-07-28 MED ORDER — POTASSIUM CHLORIDE 20 MEQ/15ML (10%) PO SOLN
40.0000 meq | Freq: Once | ORAL | Status: AC
  Administered 2019-07-28: 40 meq
  Filled 2019-07-28: qty 30

## 2019-07-28 NOTE — Progress Notes (Signed)
Patient has been very agitated since this morning. He has required multiple prn medications and increasing in his sedation drips. Dr. Vaughan Browner ordered CIWA protocol, based off patient's polysubstance/ETOH history, but this is difficult to assess due to patient being intubated. He is unable to answer the appropriate questions. Will continue to reevaluate and medicate as needed.  Joellen Jersey, RN

## 2019-07-28 NOTE — Progress Notes (Signed)
PT Cancellation Note  Patient Details Name: Vernon Frye MRN: [id number] DOB: 1960-11-29   Cancelled Treatment:    Reason Eval/Treat Not Completed: Medical issues which prohibited therapy. Per discussion with RN pt on increased sedation 2/2 agitation, and has made attempts to self-extubate. Pt not appropriate to safely participate in skilled PT intervention at this time. PT will follow up tomorrow, if patient remains vented and agitated then PT will sign off.   Zenaida Niece 07/28/2019, 2:36 PM

## 2019-07-28 NOTE — Progress Notes (Addendum)
Pharmacy Antibiotic Note  Vernon Frye is a 58 y.o. male admitted on 07/12/2019 with MRSA bacteremia and prosthetic joint infection. Pharmacy has been consulted for vancomycin dosing. Pt on extended infusion time (4hrs) to alleviate Red Man Syndrome; no adverse effects reported since this was begun. Vanc peak today 20 (drawn 2 hours late), trough 13 >> AUC 456, at goal. SCr stable at 0.82.  Plan: Continue vancomycin 1250mg  IV q12h  Monitor clinical progress, c/s, renal function F/u de-escalation plan/LOT, vancomycin levels at least weekly ID planning vancomycin continuing through 09/06/19   Height: 6\' 3"  (190.5 cm) Weight: 226 lb 10.1 oz (102.8 kg) IBW/kg (Calculated) : 84.5  Temp (24hrs), Avg:98.8 F (37.1 C), Min:98 F (36.7 C), Max:100.5 F (38.1 C)  Recent Labs  Lab 07/25/19 0653 07/25/19 1307 07/26/19 0404 07/27/19 0356 07/28/19 0522 07/28/19 0959 07/28/19 1412  WBC 17.7*  --  10.7* 10.8* 8.2 8.2  --   CREATININE 0.73  --  0.70 0.78 0.82 0.84  --   VANCOTROUGH  --  17  --   --   --   --  13*  VANCOPEAK  --   --   --   --  45* 20*  --     Estimated Creatinine Clearance: 124.5 mL/min (by C-G formula based on SCr of 0.84 mg/dL).    Allergies  Allergen Reactions  . Vancomycin Shortness Of Breath and Rash    [name]     Elicia Lamp, PharmD, BCPS Please check AMION for all [facility] contact numbers Clinical Pharmacist 07/28/2019 3:07 PM

## 2019-07-28 NOTE — Progress Notes (Signed)
Progress Note  Patient Name: Vernon Frye Date of Encounter: 07/28/2019  Primary Cardiologist:   Sinclair Grooms, MD   Subjective   Agitated tried to extubate himself even in restraints   Inpatient Medications    Scheduled Meds: . chlorhexidine gluconate (MEDLINE KIT)  15 mL Mouth Rinse BID  . Chlorhexidine Gluconate Cloth  6 each Topical Daily  . docusate  100 mg Per Tube BID  . feeding supplement (PRO-STAT SUGAR FREE 64)  60 mL Per Tube BID  . folic acid  1 mg Per Tube Daily  . insulin aspart  0-5 Units Subcutaneous QHS  . insulin aspart  0-6 Units Subcutaneous TID WC  . lisinopril  10 mg Per Tube Daily  . mouth rinse  15 mL Mouth Rinse 10 times per day  . multivitamin  15 mL Per Tube Daily  . potassium chloride  20 mEq Per Tube Q4H  . QUEtiapine  25 mg Per Tube BID  . thiamine  100 mg Per Tube Daily   Or  . thiamine  100 mg Intravenous Daily   Continuous Infusions: . amiodarone 30 mg/hr (07/28/19 0700)  . dexmedetomidine (PRECEDEX) IV infusion 0.5 mcg/kg/hr (07/28/19 0702)  . esmolol 75 mcg/kg/min (07/28/19 0700)  . feeding supplement (VITAL 1.5 CAL)    . propofol (DIPRIVAN) infusion 5 mcg/kg/min (07/28/19 0700)  . vancomycin 62.5 mL/hr at 07/28/19 0700   PRN Meds: docusate, fentaNYL (SUBLIMAZE) injection, fentaNYL (SUBLIMAZE) injection, haloperidol lactate, LORazepam, menthol-cetylpyridinium **OR** phenol, metoprolol tartrate, midazolam, midazolam, morphine injection, ondansetron **OR** ondansetron (ZOFRAN) IV, oxyCODONE, Resource ThickenUp Clear, sodium chloride flush   Vital Signs    Vitals:   07/28/19 0500 07/28/19 0600 07/28/19 0700 07/28/19 0752  BP: 112/71 115/75 118/78 118/78  Pulse: 93 90 95 99  Resp: _0 (!) 29  Temp:      TempSrc:      SpO2: 100% 100% 100% 100%  Weight:      Height:        Intake/Output Summary (Last 24 hours) at 07/28/2019 0814 Last data filed at 07/28/2019 0700 Gross per 24 hour  Intake 2615.43 ml  Output 1625  ml  Net 990.43 ml   Filed Weights   07/25/19 0543 07/27/19 0500 07/28/19 0450  Weight: 103 kg 102.6 kg 102.8 kg    Telemetry    Atrial fibrillation with rapid rates- Personally Reviewed  ECG    NA - Personally Reviewed  Physical Exam   Intubated  Lungs clear Poor dentition No murmur  Left TKR with edema in foot palpable DP     Labs    Chemistry Recent Labs  Lab 07/23/19 1623 07/26/19 0404 07/26/19 1719 07/27/19 0356 07/28/19 0522  NA 139 144 145 142 142  K 3.2* 3.3* 3.7 3.7 3.3*  CL 107 113*  --  110 110  CO2 23 22  --  19* 23  GLUCOSE 152* 144*  --  148* 194*  BUN 13 15  --  12 18  CREATININE 0.72 0.70  --  0.78 0.82  CALCIUM 7.7* 7.9*  --  7.8* 7.9*  PROT 6.3*  --   --   --   --   ALBUMIN 2.1*  --   --   --   --   AST 69*  --   --   --   --   ALT 96*  --   --   --   --   ALKPHOS 102  --   --   --   --  BILITOT 1.7*  --   --   --   --   GFRNONAA >60 >60  --  >60 >60  GFRAA >60 >60  --  >60 >60  ANIONGAP 9 9  --  13 9     Hematology Recent Labs  Lab 07/26/19 0404 07/26/19 1719 07/27/19 0356 07/28/19 0522  WBC 10.7*  --  10.8* 8.2  RBC 3.12*  --  3.36* 2.83*  HGB 9.3* 7.5* 10.1* 8.3*  HCT 28.4* 22.0* 31.2* 26.3*  MCV 91.0  --  92.9 92.9  MCH 29.8  --  30.1 29.3  MCHC 32.7  --  32.4 31.6  RDW 15.8*  --  16.2* 16.0*  PLT 365  --  234 238    Cardiac EnzymesNo results for input(s): TROPONINI in the last 168 hours. No results for input(s): TROPIPOC in the last 168 hours.   BNPNo results for input(s): BNP, PROBNP in the last 168 hours.   DDimer No results for input(s): DDIMER in the last 168 hours.   Radiology    DG Abd 1 View  Result Date: 07/26/2019 CLINICAL DATA:  Orogastric tube placement EXAM: ABDOMEN - 1 VIEW COMPARISON:  None. FINDINGS: There is in nasogastric tube with the tip projecting over the antrum of the stomach. There is no bowel dilatation to suggest obstruction. There is no evidence of pneumoperitoneum, portal venous gas  or pneumatosis. There are no pathologic calcifications along the expected course of the ureters. The osseous structures are unremarkable. IMPRESSION: Nasogastric tube with the tip projecting over the antrum of the stomach. Electronically Signed   By: Kathreen Devoid   On: 07/26/2019 17:11    Cardiac Studies   2D echo 06/2019 IMPRESSIONS   1. Left ventricular ejection fraction, by visual estimation, is 55 to 60%. The left ventricle has normal function. There is mildly increased left ventricular hypertrophy. 2. Left ventricular diastolic function could not be evaluated. 3. Right ventricular volume/pressure overload. 4. The left ventricle demonstrates regional wall motion abnormalities. 5. Global right ventricle has severely reduced systolic function.The right ventricular size is severely enlarged. No increase in right ventricular wall thickness. 6. Left atrial size was normal. 7. Right atrial size was mild-moderately dilated. 8. Presence of pericardial fat pad. 9. Trivial pericardial effusion is present. 10. Mild mitral annular calcification. 11. The mitral valve is degenerative. Trivial mitral valve regurgitation. 12. The tricuspid valve is grossly normal. Tricuspid valve regurgitation is trivial. 13. The aortic valve is tricuspid. Aortic valve regurgitation is not visualized. No evidence of aortic valve sclerosis or stenosis. 14. The pulmonic valve was grossly normal. Pulmonic valve regurgitation is not visualized. 15. TR signal is inadequate for assessing pulmonary artery systolic pressure. 16. The inferior vena cava is normal in size with greater than 50% respiratory variability, suggesting right atrial pressure of 3 mmHg. 17. No prior Echocardiogram.  Patient Profile     58 y.o. male with recent septic knee (prosthetic joint infection), MRSA bacteremia who is being followed for post-op afib with RVR  Assessment & Plan    ATRIAL FIB:   Rate up due to delirium vent surgery  and anemia Rate control is good with esmolol and amiodarone Will start heparin in am should be less risk of rebleeding into new TKR at that time   TKR:  Revision LE edema in binder antibiotics per ortho DVT prophylaxis will be on systemic heparin in am   For questions or updates, please contact [facility] Please consult [email or web address] for contact info under  Cardiology/STEMI.   Signed, Jenkins Rouge, MD  07/28/2019, 8:14 AM

## 2019-07-28 NOTE — Progress Notes (Signed)
[facility] Progress Note Patient Name: Vernon Frye DOB: 1961-05-05 MRN: [id number]   Date of Service  07/28/2019  HPI/Events of Note  Agitation - Request to renew soft bilateral wrist restraints.   eICU Interventions  Will renew soft bilateral wrist restraints X 12 hours.      Intervention Category Major Interventions: Delirium, psychosis, severe agitation - evaluation and management  Amyria Komar Eugene 07/28/2019, 8:36 PM

## 2019-07-28 NOTE — Progress Notes (Signed)
NAME:  Vernon Frye, MRN:  [id number], DOB:  [birth year], LOS: 63 ADMISSION DATE:  07/12/2019, CONSULTATION DATE:  07/13/19 REFERRING MD:  [facility], CHIEF COMPLAINT:  L knee pain  Brief History   58 yo M PMH Afib, HTN, L TKR (2016) who presented to ED with CC L knee pain. 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   Past Medical History  A fib HTN Anxiety L TKR  L shoulder arthroscopy  DDD  Significant Hospital Events   12/16 presents to ED for L knee pain. Concern for septic joint. A fiv RVR. Developed SOB after vanc administration. PCCM consulted for admission Underwent left knee Irrigation and debridement, total knee with poly exchange     12/29: L knee revision and replacement, remains intubated post operatively   Consults:  Ortho  Cardiology ID EP  Procedures:  ETT 12/29 >>   Significant Diagnostic Tests:  12/19 ECHO > LVEF 55-60% 12/15 L knee XR > prior L knee replacement. Moderate joint effusion, no body abnormality  12/16 Renal ultrasound > Mild left-sided hydronephrosis is noted. 12/21 MRI lumbar spine DJD, no neural impingement, no discitis 12/24 UE venous: Right: No evidence of thrombosis in the subclavian.  Left:No evidence of deep vein thrombosis in the upper extremity. Findings consistent with acute superficial vein thrombosis involving the left cephalic vein. 12/27 cth: No acute intracranial abnormality. Non contrast CT appearance of the brain within normal limits for age.  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  Micro Data:  12/16 SARS Cov2 >negative  12/16 BCx > positive Staph MRSA  12/16 Strep A > Negative  12/16 Flu A/B > neg 12/16 MRSA PCR > positive  12/16 Body culture left knee > Abundant gram positive cocci  12/18 blood: negative  Antimicrobials:  12/16 vanc x1 12/16 ceftriaxone x1 12/16 Zosyn > 12/16 Daptomycin 12/17  >12/21 vanc 12/22->6 weeks planned thru 08/15/19  Interim history/subjective:  Remains on the ventilator Started on esmolol.  Continues on Cardizem Agitated while on weans.  Objective   Blood pressure 118/78, pulse 99, temperature 99 F (37.2 C), temperature source Oral, resp. rate (!) 29, height 6\' 3"  (1.905 m), weight 102.8 kg, SpO2 100 %.    Vent Mode: PRVC FiO2 (%):  [30 %] 30 % Set Rate:  [16 bmp] 16 bmp Vt Set:  [670 mL] 670 mL PEEP:  [5 cmH20] 5 cmH20 Pressure Support:  [10 cmH20] 10 cmH20 Plateau Pressure:  [17 cmH20-20 cmH20] 18 cmH20   Intake/Output Summary (Last 24 hours) at 07/28/2019 [id number] Last data filed at 07/28/2019 0700 Gross per 24 hour  Intake 2449.92 ml  Output 1225 ml  Net 1224.92 ml   Filed Weights   07/25/19 0543 07/27/19 0500 07/28/19 0450  Weight: 103 kg 102.6 kg 102.8 kg    Examination: Gen:      No acute distress HEENT:  EOMI, sclera anicteric Neck:     No masses; no thyromegaly, ETT Lungs:    Clear to auscultation bilaterally; normal respiratory effort CV:         Regular rate and rhythm; no murmurs Abd:      + bowel sounds; soft, non-tender; no palpable masses, no distension Ext:    No edema; adequate peripheral perfusion Skin:      Warm and dry; no rash Neuro: [facility] Problem list     Assessment & Plan:  Post operative resp  insuff:  -reportedly 2/2 mental status Pressure support weans as tolerated  Acute encephalopathy secondary to agitated delirium - multifactorial could also be from polysubstance abuse or etoh withdrawal. +/- sepsis P Continue thiamine, folate Precedex drip.  CIWA protocol PRN fentanyl  MRSA Bacteremia  -resolved bacteremia on 6 weeks vanc per ID -repeat cx negative 12/18 P MRI negative for spinal abscess or osteomyelitis.  -TEE refused by pt and per ID note will not change plan until intervention from CTS needed and not likely a good candidate for that.   Afib RVR -non-adherent w  eliquis outpatient (2/2 financial constraints) P - Per cardiology, not candidate for dccv -Continue Cardizem drip, emolol - Starting heparin gtt tomorrow per cardiology  Left Knee Septic joint -suspect septic joint in setting of L TKR in 2016 -Underwent I&D with partial hardwear exchange 12/16 P -s/p revision  AKI -Hx kidney stones  -Hx urinary retention  -Renal ultrasound with mild left-sided hydronephrosis P Follow renal function / urine output (stable) Avoid nephrotoxins, ensure adequate renal perfusion  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 Substance Use  -denies substance use; UDS pos for Amphetamines, BZD, Opioids (Home opioids for pain, BZD for anxiety) P Continue thiamine   CIWA protocol, start standing valium On seroquel and haldol for now  Prediabetes a1c 6.3 -monitor gluc  Normocytic anemia:  -follow. Holding a/c at this time post oepratively  Best practice:  Diet: Tube feeds, Pain/Anxiety/Delirium protocol (if indicated): per protocol VAP protocol (if indicated): per protocol DVT prophylaxis: held GI prophylaxis: PPI  Glucose control: monitor Mobility: bed rest Code Status: Full  Family Communication:  Disposition: ICU  Labs   CBC: Recent Labs  Lab 07/24/19 0404 07/25/19 0653 07/26/19 0404 07/26/19 1719 07/27/19 0356 07/28/19 0522  WBC 18.5* 17.7* 10.7*  --  10.8* 8.2  HGB 10.0* 10.5* 9.3* 7.5* 10.1* 8.3*  HCT 31.3* 33.1* 28.4* 22.0* 31.2* 26.3*  MCV 91.0 92.7 91.0  --  92.9 92.9  PLT 423* 483* 365  --  234 [id number]    Basic Metabolic Panel: Recent Labs  Lab 07/23/19 1623 07/24/19 0404 07/25/19 0653 07/26/19 0404 07/26/19 1719 07/27/19 0356 07/28/19 0522  NA 139 141 145 144 145 142 142  K 3.2* 3.7 3.7 3.3* 3.7 3.7 3.3*  CL 107 110 112* 113*  --  110 110  CO2 23 22 20* 22  --  19* 23  GLUCOSE 152* 145* 144* 144*  --  148* 194*  BUN 13 12 13 15   --  12 18  CREATININE 0.72 0.76 0.73 0.70  --  0.78 0.82  CALCIUM 7.7* 7.7* 7.8* 7.9*   --  7.8* 7.9*  MG 1.5*  --  1.6* 1.6*  --  1.6* 1.7  PHOS  --   --  2.4* 2.8  --   --  3.1   GFR: Estimated Creatinine Clearance: 127.5 mL/min (by C-G formula based on SCr of 0.82 mg/dL). Recent Labs  Lab 07/25/19 0653 07/26/19 0404 07/27/19 0356 07/28/19 0522  WBC 17.7* 10.7* 10.8* 8.2    Liver Function Tests: Recent Labs  Lab 07/23/19 1623  AST 69*  ALT 96*  ALKPHOS 102  BILITOT 1.7*  PROT 6.3*  ALBUMIN 2.1*   No results for input(s): LIPASE, AMYLASE in the last 168 hours. Recent Labs  Lab 07/23/19 1623  AMMONIA 27    ABG    Component Value Date/Time   PHART 7.404 07/26/2019 1719   PCO2ART 33.8 07/26/2019 1719   PO2ART 180.0 (  H) 07/26/2019 1719   HCO3 21.2 07/26/2019 1719   TCO2 22 07/26/2019 1719   ACIDBASEDEF 3.0 (H) 07/26/2019 1719   O2SAT 100.0 07/26/2019 1719     Coagulation Profile: No results for input(s): INR, PROTIME in the last 168 hours.  Cardiac Enzymes: No results for input(s): CKTOTAL, CKMB, CKMBINDEX, TROPONINI in the last 168 hours.  HbA1C: Hgb A1c MFr Bld  Date/Time Value Ref Range Status  07/27/2019 03:56 AM 5.2 4.8 - 5.6 % Final    Comment:    (NOTE) Pre diabetes:          5.7%-6.4% Diabetes:              >6.4% Glycemic control for   <7.0% adults with diabetes   07/13/2019 04:08 PM 6.3 (H) 4.8 - 5.6 % Final    Comment:    (NOTE) Pre diabetes:          5.7%-6.4% Diabetes:              >6.4% Glycemic control for   <7.0% adults with diabetes     CBG: Recent Labs  Lab 07/27/19 1547 07/27/19 1955 07/27/19 2356 07/28/19 0356 07/28/19 0748  GLUCAP 135* 138* 174* 162* 172*     Critical care time:    The patient is critically ill with multiple organ system failure and requires high complexity decision making for assessment and support, frequent evaluation and titration of therapies, advanced monitoring, review of radiographic studies and interpretation of complex data.   Critical Care Time devoted to patient care  services, exclusive of separately billable procedures, described in this note is 45 minutes.   Marshell Garfinkel MD  [facility] Please see [email or web address] for pager details.  07/28/2019, 9:22 AM

## 2019-07-29 ENCOUNTER — Inpatient Hospital Stay (HOSPITAL_COMMUNITY): Payer: Self-pay

## 2019-07-29 ENCOUNTER — Inpatient Hospital Stay: Payer: Self-pay

## 2019-07-29 DIAGNOSIS — E440 Moderate protein-calorie malnutrition: Secondary | ICD-10-CM | POA: Insufficient documentation

## 2019-07-29 DIAGNOSIS — R609 Edema, unspecified: Secondary | ICD-10-CM

## 2019-07-29 DIAGNOSIS — J9600 Acute respiratory failure, unspecified whether with hypoxia or hypercapnia: Secondary | ICD-10-CM

## 2019-07-29 LAB — BASIC METABOLIC PANEL
Anion gap: 8 (ref 5–15)
Anion gap: 10 (ref 5–15)
BUN: 18 mg/dL (ref 6–20)
BUN: 17 mg/dL (ref 6–20)
CO2: 21 mmol/L — ABNORMAL LOW (ref 22–32)
CO2: 25 mmol/L (ref 22–32)
Calcium: 7.7 mg/dL — ABNORMAL LOW (ref 8.9–10.3)
Calcium: 7.5 mg/dL — ABNORMAL LOW (ref 8.9–10.3)
Chloride: 114 mmol/L — ABNORMAL HIGH (ref 98–111)
Chloride: 107 mmol/L (ref 98–111)
Creatinine, Ser: 0.79 mg/dL (ref 0.61–1.24)
Creatinine, Ser: 0.84 mg/dL (ref 0.61–1.24)
GFR calc Af Amer: >60 mL/min (ref >60)
GFR calc Af Amer: >60 mL/min (ref >60)
GFR calc non Af Amer: >60 mL/min (ref >60)
GFR calc non Af Amer: >60 mL/min (ref >60)
Glucose, Bld: 191 mg/dL — ABNORMAL HIGH (ref 70–99)
Glucose, Bld: 146 mg/dL — ABNORMAL HIGH (ref 70–99)
Potassium: 3.6 mmol/L (ref 3.5–5.1)
Potassium: 3.3 mmol/L — ABNORMAL LOW (ref 3.5–5.1)
Sodium: 143 mmol/L (ref 135–145)
Sodium: 142 mmol/L (ref 135–145)

## 2019-07-29 LAB — CBC
HCT: 26.4 % — ABNORMAL LOW (ref 39.0–52.0)
Hemoglobin: 8.2 g/dL — ABNORMAL LOW (ref 13.0–17.0)
MCH: 29 pg (ref 26.0–34.0)
MCHC: 31.1 g/dL (ref 30.0–36.0)
MCV: 93.3 fL (ref 80.0–100.0)
Platelets: 187 10*3/uL (ref 150–400)
RBC: 2.83 MIL/uL — ABNORMAL LOW (ref 4.22–5.81)
RDW: 15.9 % — ABNORMAL HIGH (ref 11.5–15.5)
WBC: 7.3 10*3/uL (ref 4.0–10.5)
nRBC: 0 % (ref 0.0–0.2)

## 2019-07-29 LAB — GLUCOSE, CAPILLARY
Glucose-Capillary: 171 mg/dL — ABNORMAL HIGH (ref 70–99)
Glucose-Capillary: 156 mg/dL — ABNORMAL HIGH (ref 70–99)
Glucose-Capillary: 145 mg/dL — ABNORMAL HIGH (ref 70–99)
Glucose-Capillary: 166 mg/dL — ABNORMAL HIGH (ref 70–99)
Glucose-Capillary: 160 mg/dL — ABNORMAL HIGH (ref 70–99)
Glucose-Capillary: 164 mg/dL — ABNORMAL HIGH (ref 70–99)

## 2019-07-29 LAB — MAGNESIUM
Magnesium: 1.7 mg/dL (ref 1.7–2.4)
Magnesium: 1.6 mg/dL — ABNORMAL LOW (ref 1.7–2.4)

## 2019-07-29 LAB — PHOSPHORUS: Phosphorus: 2.7 mg/dL (ref 2.5–4.6)

## 2019-07-29 LAB — HEPARIN LEVEL (UNFRACTIONATED): Heparin Unfractionated: 0.1 IU/mL — ABNORMAL LOW (ref 0.30–0.70)

## 2019-07-29 LAB — TRIGLYCERIDES: Triglycerides: 133 mg/dL (ref <150)

## 2019-07-29 MED ORDER — MAGNESIUM SULFATE 2 GM/50ML IV SOLN
2.0000 g | Freq: Once | INTRAVENOUS | Status: AC
  Administered 2019-07-29: 2 g via INTRAVENOUS
  Filled 2019-07-29: qty 50

## 2019-07-29 MED ORDER — POTASSIUM CHLORIDE 20 MEQ/15ML (10%) PO SOLN
20.0000 meq | ORAL | Status: AC
  Administered 2019-07-29: 20 meq
  Filled 2019-07-29 (×2): qty 15

## 2019-07-29 MED ORDER — SODIUM CHLORIDE 0.9% FLUSH
10.0000 mL | INTRAVENOUS | Status: DC | PRN
  Administered 2019-08-04: 10 mL
  Administered 2019-08-05 – 2019-08-07 (×2): 20 mL
  Administered 2019-08-20 – 2019-08-26 (×2): 10 mL
  Administered 2019-08-28: 20 mL

## 2019-07-29 MED ORDER — METOPROLOL TARTRATE 5 MG/5ML IV SOLN
INTRAVENOUS | Status: AC
  Administered 2019-07-29: 10:00:00 2.5 mg
  Filled 2019-07-29: qty 5

## 2019-07-29 MED ORDER — HEPARIN (PORCINE) 25000 UT/250ML-% IV SOLN
2800.0000 [IU]/h | INTRAVENOUS | Status: DC
  Administered 2019-07-29: 2200 [IU]/h via INTRAVENOUS
  Administered 2019-07-29: 2600 [IU]/h via INTRAVENOUS
  Administered 2019-07-30 – 2019-08-03 (×10): 2800 [IU]/h via INTRAVENOUS
  Filled 2019-07-29 (×13): qty 250

## 2019-07-29 MED ORDER — FUROSEMIDE 10 MG/ML IJ SOLN
40.0000 mg | Freq: Once | INTRAMUSCULAR | Status: AC
  Administered 2019-07-29: 40 mg via INTRAVENOUS
  Filled 2019-07-29: qty 4

## 2019-07-29 MED ORDER — METOPROLOL TARTRATE 5 MG/5ML IV SOLN
2.5000 mg | Freq: Four times a day (QID) | INTRAVENOUS | Status: AC
  Administered 2019-07-29 – 2019-07-31 (×9): 2.5 mg via INTRAVENOUS
  Filled 2019-07-29 (×9): qty 5

## 2019-07-29 MED ORDER — SODIUM CHLORIDE 0.9% FLUSH
10.0000 mL | Freq: Two times a day (BID) | INTRAVENOUS | Status: DC
  Administered 2019-07-29 – 2019-09-07 (×32): 10 mL

## 2019-07-29 NOTE — Progress Notes (Signed)
Dr. Vaughan Browner notified of left side (arm, leg, foot) edema.  Orders received.  Will continue to monitor.

## 2019-07-29 NOTE — Plan of Care (Signed)
  Problem: Clinical Measurements: Goal: Ability to maintain clinical measurements within normal limits will improve Outcome: Progressing Goal: Will remain free from infection Outcome: Progressing Goal: Diagnostic test results will improve Outcome: Progressing   Problem: Nutrition: Goal: Adequate nutrition will be maintained Outcome: Progressing   Problem: Education: Goal: Knowledge of General Education information will improve Description: Including pain rating scale, medication(s)/side effects and non-pharmacologic comfort measures Outcome: Not Progressing   Problem: Coping: Goal: Level of anxiety will decrease Outcome: Not Progressing   Problem: Elimination: Goal: Will not experience complications related to bowel motility Outcome: Not Progressing

## 2019-07-29 NOTE — Progress Notes (Signed)
ANTICOAGULATION CONSULT NOTE - Follow Up Consult  Pharmacy Consult for Heparin  Indication: atrial fibrillation  Allergies  Allergen Reactions  . Vancomycin Shortness Of Breath and Rash    [name]     Patient Measurements: Height: 6\' 3"  (190.5 cm) Weight: 226 lb 10.1 oz (102.8 kg) IBW/kg (Calculated) : 84.5  Heparin Dosing Weight: 102 kg  Vital Signs: Temp: 100.5 F (38.1 C) (01/01 1157) Temp Source: Oral (01/01 1157) BP: 111/78 (01/01 1500) Pulse Rate: 115 (01/01 1733)  Labs: Recent Labs    07/28/19 0522 07/28/19 0959 07/29/19 0243 07/29/19 1700  HGB 8.3* 8.7* 8.2*  --   HCT 26.3* 28.0* 26.4*  --   PLT 238 228 187  --   HEPARINUNFRC  --   --   --  0.10*  CREATININE 0.82 0.84 0.79 0.84    Estimated Creatinine Clearance: 124.5 mL/min (by C-G formula based on SCr of 0.84 mg/dL).   Medical History: Past Medical History:  Diagnosis Date  . Anxiety    takes Xanax daily  . Arthritis   . Chronic back pain    DDD  . Complication of anesthesia    agitated when awaking   . H/O rotator cuff tear   . History of kidney stones   . Hypertension    takes Toprol,Prinizide,and Amlodipine daily  . Joint pain   . Joint swelling   . Laceration    history of to right lower extermity   . Night muscle spasms    takes Soma daily  . Numbness and tingling    hands and feet   . Pneumonia    history of   . Tinnitus     Assessment: 59 yo M on anticoagulation for afib.  Pt has been on heparin >> enoxaparin >> eliquis this admission.  Last dose 12/25.  Anticoagulation held since 12/25 for total knee revision and post-op surgical site bleeding, Hgb low but stable for a few days, heparin restarted 1/1 AM with close monitoring of H/H. Will aim for lower goal.  Heparin level 0.1 on 2200 units/hr.  Previously patient required dose > 2000 units/hr and still did not obtain therapeutic levels.  Noted patient had PICC line placement today.  Confirmed with RN that heparin was  not held for line placement and level is accurate.  Heparin is infusing through peripheral IV site and labs are being obtained from PICC.  No bleeding noted at this time.  Goal of Therapy:  Heparin level 0.3-0.5 units/ml, recent bleeding Monitor platelets by anticoagulation protocol: Yes   Plan:  Increase heparin to 2600 units/hr  Next heparin level at midnight. Daily CBC and heparin level Monitor closely for bleeding  [employer], Pharm.D., BCPS Clinical Pharmacist  **Pharmacist phone directory can be found on [email or web address] listed under [place].  07/29/2019 6:18 PM

## 2019-07-29 NOTE — Evaluation (Signed)
SLP Cancellation Note  Patient Details Name: DAELEN SEANG MRN: [id number] DOB: 1961/02/20   Cancelled treatment:       Reason Eval/Treat Not Completed: Other (comment);Medical issues which prohibited therapy(pt on vent at this time)   Macario Golds 07/29/2019, 8:00 AM  Kathleen Lime, MS [place] Office [phone]
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
## 2019-07-29 NOTE — Progress Notes (Signed)
 Progress Note  Patient Name: Vernon Frye Date of Encounter: 07/29/2019  Primary Cardiologist:   Henry W Smith III, MD   Subjective   Intubated with restraints Less agitated on Precedex  Inpatient Medications    Scheduled Meds: . chlorhexidine gluconate (MEDLINE KIT)  15 mL Mouth Rinse BID  . Chlorhexidine Gluconate Cloth  6 each Topical Daily  . diazepam  5 mg Per Tube BID  . docusate  100 mg Per Tube BID  . feeding supplement (PRO-STAT SUGAR FREE 64)  60 mL Per Tube BID  . folic acid  1 mg Per Tube Daily  . insulin aspart  0-5 Units Subcutaneous QHS  . insulin aspart  0-6 Units Subcutaneous TID WC  . lisinopril  10 mg Per Tube Daily  . mouth rinse  15 mL Mouth Rinse 10 times per day  . multivitamin with minerals  1 tablet Per Tube Daily  . pantoprazole (PROTONIX) IV  40 mg Intravenous Q24H  . potassium chloride  20 mEq Per Tube Q4H  . QUEtiapine  25 mg Per Tube BID  . thiamine  100 mg Per Tube Daily   Or  . thiamine  100 mg Intravenous Daily   Continuous Infusions: . amiodarone 30 mg/hr (07/29/19 0400)  . dexmedetomidine (PRECEDEX) IV infusion 1.2 mcg/kg/hr (07/29/19 0400)  . esmolol Stopped (07/29/19 0131)  . feeding supplement (VITAL 1.5 CAL) 40 mL/hr at 07/28/19 1434  . magnesium sulfate bolus IVPB    . propofol (DIPRIVAN) infusion 20 mcg/kg/min (07/29/19 0400)  . vancomycin 62.5 mL/hr at 07/29/19 0400   PRN Meds: docusate, fentaNYL (SUBLIMAZE) injection, fentaNYL (SUBLIMAZE) injection, haloperidol lactate, LORazepam, LORazepam **OR** LORazepam, menthol-cetylpyridinium **OR** phenol, metoprolol tartrate, midazolam, midazolam, morphine injection, ondansetron **OR** ondansetron (ZOFRAN) IV, oxyCODONE, Resource ThickenUp Clear, sodium chloride flush   Vital Signs    Vitals:   07/29/19 0318 07/29/19 0334 07/29/19 0400 07/29/19 0403  BP:   109/72 109/72  Pulse:   92 97  Resp:   18 19  Temp: 100.1 F (37.8 C)     TempSrc: Oral     SpO2:   100% 100%  Weight:   102.8 kg    Height:        Intake/Output Summary (Last 24 hours) at 07/29/2019 0636 Last data filed at 07/29/2019 0500 Gross per 24 hour  Intake 2854.02 ml  Output 1000 ml  Net 1854.02 ml   Filed Weights   07/27/19 0500 07/28/19 0450 07/29/19 0334  Weight: 102.6 kg 102.8 kg 102.8 kg    Telemetry    Atrial fibrillation with rapid rates- Personally Reviewed  ECG    NA - Personally Reviewed  Physical Exam   Intubated  Lungs clear Poor dentition No murmur  Left TKR with edema in foot palpable DP Considerable edema left leg hard to doppler pulses Edema in left arm and bilaterally in hands  Feeding tube in place      Labs    Chemistry Recent Labs  Lab 07/23/19 1623 07/28/19 0522 07/28/19 0959 07/29/19 0243  NA 139 142 144 143  K 3.2* 3.3* 3.6 3.6  CL 107 110 114* 114*  CO2 23 23 22 21*  GLUCOSE 152* 194* 166* 191*  BUN 13 18 18 18  CREATININE 0.72 0.82 0.84 0.79  CALCIUM 7.7* 7.9* 8.0* 7.7*  PROT 6.3*  --  5.8*  --   ALBUMIN 2.1*  --  1.7*  --   AST 69*  --  54*  --   ALT   96*  --  76*  --   ALKPHOS 102  --  109  --   BILITOT 1.7*  --  0.9  --   GFRNONAA >60 >60 >60 >60  GFRAA >60 >60 >60 >60  ANIONGAP 9 9 8 8     Hematology Recent Labs  Lab 07/28/19 0522 07/28/19 0959 07/29/19 0243  WBC 8.2 8.2 7.3  RBC 2.83* 3.03* 2.83*  HGB 8.3* 8.7* 8.2*  HCT 26.3* 28.0* 26.4*  MCV 92.9 92.4 93.3  MCH 29.3 28.7 29.0  MCHC 31.6 31.1 31.1  RDW 16.0* 15.9* 15.9*  PLT 238 228 187    Cardiac EnzymesNo results for input(s): TROPONINI in the last 168 hours. No results for input(s): TROPIPOC in the last 168 hours.   BNPNo results for input(s): BNP, PROBNP in the last 168 hours.   DDimer No results for input(s): DDIMER in the last 168 hours.   Radiology    DG Chest Port 1 View  Result Date: 07/28/2019 CLINICAL DATA:  Intubation EXAM: PORTABLE CHEST 1 VIEW COMPARISON:  07/13/2019 FINDINGS: Endotracheal tube tip is at the clavicular heads. The orogastric  tube reaches the stomach. Streaky retrocardiac density. Cardiomegaly. No visible effusion or pneumothorax. IMPRESSION: 1. Unremarkable hardware positioning. 2. Unchanged retrocardiacinfiltrate Electronically Signed   By: Jonathon  Watts M.D.   On: 07/28/2019 08:26    Cardiac Studies   2D echo 06/2019 IMPRESSIONS   1. Left ventricular ejection fraction, by visual estimation, is 55 to 60%. The left ventricle has normal function. There is mildly increased left ventricular hypertrophy. 2. Left ventricular diastolic function could not be evaluated. 3. Right ventricular volume/pressure overload. 4. The left ventricle demonstrates regional wall motion abnormalities. 5. Global right ventricle has severely reduced systolic function.The right ventricular size is severely enlarged. No increase in right ventricular wall thickness. 6. Left atrial size was normal. 7. Right atrial size was mild-moderately dilated. 8. Presence of pericardial fat pad. 9. Trivial pericardial effusion is present. 10. Mild mitral annular calcification. 11. The mitral valve is degenerative. Trivial mitral valve regurgitation. 12. The tricuspid valve is grossly normal. Tricuspid valve regurgitation is trivial. 13. The aortic valve is tricuspid. Aortic valve regurgitation is not visualized. No evidence of aortic valve sclerosis or stenosis. 14. The pulmonic valve was grossly normal. Pulmonic valve regurgitation is not visualized. 15. TR signal is inadequate for assessing pulmonary artery systolic pressure. 16. The inferior vena cava is normal in size with greater than 50% respiratory variability, suggesting right atrial pressure of 3 mmHg. 17. No prior Echocardiogram.  Patient Profile     58 y.o. male with recent septic knee (prosthetic joint infection), MRSA bacteremia who is being followed for post-op afib with RVR  Assessment & Plan    ATRIAL FIB:   Rate better in 90's off esmolol and on amiodarone heparin to  start back today follow Hct   TKR:  Revision LE edema in binder antibiotics per ortho heparin for afib  Agitation:  Better on precedex  For questions or updates, please contact [facility] Please consult [email or web address] for contact info under Cardiology/STEMI.   Signed,  , MD  07/29/2019, 6:36 AM    

## 2019-07-29 NOTE — Progress Notes (Signed)
Per CXR, PICC tip is in CAJ, routine orders placed, [employer] notified.

## 2019-07-29 NOTE — Progress Notes (Signed)
Notified by nursing that both patients mother [name] and patients son [name] have made the decision not to take part in patients care and would not want to be contacted for any decision regarding care including procedural consents. I personally called and spoke with Ms. Vernon Frye and verified that she nor patients son want to be apart of patients care in any way.

## 2019-07-29 NOTE — Progress Notes (Signed)
Dr Vaughan Browner deems placement of PICC medically necessary for tx of pt.

## 2019-07-29 NOTE — Progress Notes (Signed)
Left upper extremity and left lower extremity venous duplexes completed. Refer to "CV Proc" under chart review to view preliminary results.  07/29/2019 3:23 PM Kelby Aline., MHA, RVT, RDCS, RDMS

## 2019-07-29 NOTE — Progress Notes (Signed)
Peripherally Inserted Central Catheter/Midline Placement  The IV Nurse has discussed with the patient and/or persons authorized to consent for the patient, the purpose of this procedure and the potential benefits and risks involved with this procedure.  The benefits include less needle sticks, lab draws from the catheter, and the patient may be discharged home with the catheter. Risks include, but not limited to, infection, bleeding, blood clot (thrombus formation), and puncture of an artery; nerve damage and irregular heartbeat and possibility to perform a PICC exchange if needed/ordered by physician.  Alternatives to this procedure were also discussed.  Bard Power PICC patient education guide, fact sheet on infection prevention and patient information card has been provided to patient /or left at bedside.  Unable to obtain consent from pt or family.  PICC placed per medical necessity.  PICC/Midline Placement Documentation  PICC Triple Lumen 07/29/19 PICC Right Brachial 41 cm 1 cm (Active)  Indication for Insertion or Continuance of Line Vasoactive infusions;Administration of hyperosmolar/irritating solutions (i.e. TPN, Vancomycin, etc.);Prolonged intravenous therapies;Limited venous access - need for IV therapy >5 days (PICC only);Poor Vasculature-patient has had multiple peripheral attempts or PIVs lasting less than 24 hours 07/29/19 1617  Exposed Catheter (cm) 1 cm 07/29/19 1617  Site Assessment Clean;Dry;Intact 07/29/19 1617  Lumen #1 Status Flushed;Saline locked;Blood return noted 07/29/19 1617  Lumen #2 Status Flushed;Saline locked;Blood return noted 07/29/19 1617  Lumen #3 Status Flushed;Saline locked;Blood return noted 07/29/19 1617  Dressing Type Transparent 07/29/19 1617  Dressing Status Clean;Dry;Intact;Antimicrobial disc in place 07/29/19 [place] checked and tightened 07/29/19 1617  Line Adjustment (NICU/IV Team Only) No 07/29/19 1617  Dressing Intervention New  dressing 07/29/19 1617  Dressing Change Due 08/05/19 07/29/19 1617       [name] 07/29/2019, 4:18 PM

## 2019-07-29 NOTE — Progress Notes (Signed)
Per CXR results, PICC tip in the Azygous vein.  Power flush attempted with pt leaning forward as much as possible, all 3 ports flushed.  PCXR reordered for assessment of intervention.

## 2019-07-29 NOTE — Progress Notes (Signed)
Per matthew babish pa.  May leave ACE bandage off of Left knee.  No passive ROM.

## 2019-07-29 NOTE — Progress Notes (Addendum)
Physical Therapy Treatment Patient Details Name: Vernon Frye MRN: [id number] DOB: 1960/12/30 Today's Date: 07/29/2019    History of Present Illness 59 yo M PMH Afib, HTN, L TKR (2016) who presented to ED with CC L knee pain. Pain began 12/14 and has progressed prompting presentation. 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 Desaturated with hypoxemic respiratory failure requiring 3LNC. Pt underwent L TKR with placement of antibiotic spacers on 12/29, agitated after procedure and unable to be extubated.    PT Comments    Pt limited by pain, tachycardia, and elevated RR. Pt is able to follow one step commands consistently with increased time and verbal cues. Pt participating in ROM assessment of extremities with full ROM of UE noted, grossly 4-/5 strength bilaterally, noted edema in LUE. Pt with functional ROM in RLE and 3/5 strength grossly. Pt with 1/5 strength in LLE grossly, knee extension WFL, knee flexion PROM limited to roughly 20-30 degrees at this time due to stiffness and pain. PT attempting to message orthopedic team to confirm no ROM restrictions present with spacer, in an attempt to initiate knee PROM to aide in regaining functional knee flexion of LLE. Pt will benefit from continued acute PT services to improve ROM, strength, balance, activity tolerance, and functional mobility. Pt will benefit from further assessment of functional mobility at next session if vitals more stable.  *After evaluation PT able to contact Danae Orleans, PA-C. Pt is to have no ROM in L knee, remain in knee extension or immobilizer*  Follow Up Recommendations  SNF;Supervision/Assistance - 24 hour     Equipment Recommendations  (defer to post-acute setting)    Recommendations for Other Services       Precautions / Restrictions Precautions Precautions: Fall;Knee- per discussion with PA-C after evaluation no ROM of L  knee. Required Braces or Orthoses: Knee Immobilizer - Left Knee Immobilizer - Left: (no specific orders, on upon arrival, removed for mobility) Restrictions Weight Bearing Restrictions: Yes LLE Weight Bearing: Touchdown weight bearing    Mobility  Bed Mobility Overal bed mobility: (bed mobility deferred, RR to 43 with AAROM in supine)                Transfers                    Ambulation/Gait                 Stairs             Wheelchair Mobility    Modified Rankin (Stroke Patients Only)       Balance                                            Cognition Arousal/Alertness: Lethargic(responds to verbal stimuli) Behavior During Therapy: Flat affect Overall Cognitive Status: Difficult to assess                         Following Commands: Follows one step commands consistently              Exercises Total Joint Exercises Ankle Circles/Pumps: AROM;Right;10 reps Straight Leg Raises: AAROM;Right;5 reps    General Comments General comments (skin integrity, edema, etc.): pt in knee immobilizer at start of session, doffed for mobility and donned again at end  of session. PT seeking further clarification on ROM and need for immobilizer. Pt tachy into 130s with RR up to 43 with AAROM and AROM in bed. PT defers funcitonal mobility due to this      Pertinent Vitals/Pain Pain Assessment: Faces Faces Pain Scale: Hurts whole lot Pain Location: L knee Pain Descriptors / Indicators: Grimacing;Guarding Pain Intervention(s): Limited activity within patient's tolerance    Home Living                      Prior Function            PT Goals (current goals can now be found in the care plan section) Acute Rehab PT Goals Patient Stated Goal: pt unable to state goal PT Goal Formulation: Patient unable to participate in goal setting Time For Goal Achievement: 08/12/19 Potential to Achieve Goals: Fair Additional  Goals Additional Goal #1: Pt will mobilize in manual wheelchair for 50' with use of BUE and minA. Progress towards PT goals: Goals downgraded-see care plan    Frequency    Min 3X/week      PT Plan Current plan remains appropriate    Co-evaluation              AM-PAC PT "6 Clicks" Mobility   Outcome Measure  Help needed turning from your back to your side while in a flat bed without using bedrails?: Total Help needed moving from lying on your back to sitting on the side of a flat bed without using bedrails?: Total Help needed moving to and from a bed to a chair (including a wheelchair)?: Total Help needed standing up from a chair using your arms (e.g., wheelchair or bedside chair)?: Total Help needed to walk in hospital room?: Total Help needed climbing 3-5 steps with a railing? : Total 6 Click Score: 6    End of Session Equipment Utilized During Treatment: Left knee immobilizer Activity Tolerance: Patient limited by pain;Treatment limited secondary to medical complications (Comment) Patient left: in bed;with call bell/phone within reach;with bed alarm set Nurse Communication: Mobility status;Precautions;Weight bearing status PT Visit Diagnosis: Other abnormalities of gait and mobility (R26.89);Pain Pain - Right/Left: Left Pain - part of body: Knee     Time: [id number] PT Time Calculation (min) (ACUTE ONLY): 16 min  Charges:                        Zenaida Niece, PT, DPT Acute Rehabilitation Pager: [phone]    Zenaida Niece 07/29/2019, 1:13 PM

## 2019-07-29 NOTE — Progress Notes (Signed)
NAME:  Vernon Frye, MRN:  [id number], DOB:  Nov 22, 1960, LOS: 36 ADMISSION DATE:  07/12/2019, CONSULTATION DATE:  07/13/19 REFERRING MD:  [place] - [facility], CHIEF COMPLAINT:  L knee pain  Brief History   59 yo M PMH Afib, HTN, L TKR (2016) who presented to ED with CC L knee pain. 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   Past Medical History  A fib HTN Anxiety L TKR  L shoulder arthroscopy  DDD  Significant Hospital Events   12/16 presents to ED for L knee pain. Concern for septic joint. A fiv RVR. Developed SOB after vanc administration. PCCM consulted for admission Underwent left knee Irrigation and debridement, total knee with poly exchange     12/29: L knee revision and replacement, remains intubated post operatively   Consults:  Ortho  Cardiology ID EP  Procedures:  ETT 12/29 >>   Significant Diagnostic Tests:  12/19 ECHO > LVEF 55-60%  12/15 L knee XR > prior L knee replacement. Moderate joint effusion, no body abnormality   12/16 Renal ultrasound > Mild left-sided hydronephrosis is noted.  12/21 MRI lumbar spine DJD, no neural impingement, no discitis  12/24 UE venous: Right: No evidence of thrombosis in the subclavian. Left:No evidence of deep vein thrombosis in the upper extremity. Findings consistent with acute superficial vein thrombosis involving the left cephalic vein.  12/27 cth: No acute intracranial abnormality. Non contrast CT appearance of the brain within normal limits for age. 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  Micro Data:  12/16 SARS Cov2 > negative  12/16 BCx > positive Staph MRSA  12/16 Strep A > Negative  12/16 Flu A/B > neg 12/16 MRSA PCR > positive  12/16 Body culture left knee > Abundant gram positive cocci  12/18 blood: negative  Antimicrobials:  12/16 vanc x1 12/16 ceftriaxone x1 12/16 Zosyn > 12/16 Daptomycin  12/17 >12/21 vanc 12/22 > 6 weeks planned thru 09/06/2019  Interim history/subjective:  Lying in bed on vent, able to follow commands, agitation improved from overnight.   Objective   Blood pressure 118/81, pulse (!) 125, temperature 100.1 F (37.8 C), temperature source Oral, resp. rate (!) 26, height 6\' 3"  (1.905 m), weight 102.8 kg, SpO2 99 %.    Vent Mode: CPAP;PSV FiO2 (%):  [30 %] 30 % Set Rate:  [16 bmp] 16 bmp Vt Set:  [670 mL] 670 mL PEEP:  [5 cmH20] 5 cmH20 Pressure Support:  [12 cmH20-15 cmH20] 15 cmH20 Plateau Pressure:  [14 cmH20-22 cmH20] 14 cmH20   Intake/Output Summary (Last 24 hours) at 07/29/2019 [id number] Last data filed at 07/29/2019 0600 Gross per 24 hour  Intake 2872.88 ml  Output 1000 ml  Net 1872.88 ml   Filed Weights   07/27/19 0500 07/28/19 0450 07/29/19 0334  Weight: 102.6 kg 102.8 kg 102.8 kg    Examination: General: Chronically ill appearing elderly male on mechanical ventilation, in NAD HEENT: ETT, MM pink/moist, PERRL,  Neuro: Alert and able to follow commands on vent this morning  CV: s1s2 regular rate and rhythm, no murmur, rubs, or gallops,  PULM:  Clear to ascultation bilaterally, no increased work of breathing  GI: soft, bowel sounds active in all 4 quadrants, non-tender, non-distended, tolerating TF Extremities: warm/dry, no edema  Skin: no rashes or lesions  Resolved Hospital Problem list     Assessment & Plan:  Post operative resp insuff:  -reportedly 2/2 mental  status P: Continue ventilator support with lung protective strategies  Tolerating SBT currently, possible able to extubate today  Head of bed elevated 30 degrees. Plateau pressures less than 30 cm H20.  Follow intermittent chest x-ray and ABG.   Ensure adequate pulmonary hygiene  Follow cultures  VAP bundle in place  PAD protocol  Acute encephalopathy secondary to agitated delirium - multifactorial could also be from polysubstance abuse or etoh withdrawal. +/- sepsis P  Continue Thiamine, Folate, and Multivitamin  Precedex drip for agitation  PRN fentanyl  Delirium precautions   MRSA Bacteremia  -resolved bacteremia, on 6 weeks vanc per ID -repeat cx negative 12/18 -MRI negative for spinal abscess or osteomyelitis.  -TEE refused by pt and per ID note will not change plan until intervention from CTS needed and not likely a good candidate for that.  P Continue IV antibiotics per ID recommendations  PICC in place for antibiotics   Afib RVR -Non-adherent w eliquis outpatient (2/2 financial constraints) -Per cardiology, not candidate for dccv P  Continue Cardizem drip and Esmolol PRN beta blocker  IV heparin drip   Left Knee Septic joint -suspect septic joint in setting of L TKR in 2016 -Underwent I&D with partial hardwear exchange 12/16 -Total knee revision with removal of left total knee replacement and insertion of antibiotic spacers 12/29 P Management per ortho PT once extubated   AKI, resolved  -Hx kidney stones  -Hx urinary retention  -Renal ultrasound with mild left-sided hydronephrosis P Follow renal function and electrolytes   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 Substance Use  -denies substance use; UDS pos for Amphetamines, BZD, Opioids (Home opioids for pain, BZD for anxiety) P Precedex drip as above  Continue Thiamine, Folate, and Multivitamin  Scheduled Valium ans Seroquel  PRN haldol  CIWA protocol   Prediabetes -a1c 6.3 P: Monitor   Normocytic anemia:  P:  Stable  Anticoagulation resumed 1/1  Best practice:  Diet: Tube feeds Pain/Anxiety/Delirium protocol (if indicated): per protocol VAP protocol (if indicated): per protocol DVT prophylaxis: held GI prophylaxis: PPI  Glucose control: monitor Mobility: bed rest Code Status: Full  Family Communication:  Disposition: ICU  Labs   CBC: Recent Labs  Lab 07/26/19 0404 07/26/19 1719 07/27/19 0356 07/28/19 0522 07/28/19 0959 07/29/19 0243  WBC 10.7*   --  10.8* 8.2 8.2 7.3  HGB 9.3* 7.5* 10.1* 8.3* 8.7* 8.2*  HCT 28.4* 22.0* 31.2* 26.3* 28.0* 26.4*  MCV 91.0  --  92.9 92.9 92.4 93.3  PLT 365  --  234 238 228 [id number]    Basic Metabolic Panel: Recent Labs  Lab 07/25/19 0653 07/26/19 0404 07/26/19 1719 07/27/19 0356 07/28/19 0522 07/28/19 0959 07/29/19 0243  NA 145 144 145 142 142 144 143  K 3.7 3.3* 3.7 3.7 3.3* 3.6 3.6  CL 112* 113*  --  110 110 114* 114*  CO2 20* 22  --  19* 23 22 21*  GLUCOSE 144* 144*  --  148* 194* 166* 191*  BUN 13 15  --  12 18 18 18   CREATININE 0.73 0.70  --  0.78 0.82 0.84 0.79  CALCIUM 7.8* 7.9*  --  7.8* 7.9* 8.0* 7.7*  MG 1.6* 1.6*  --  1.6* 1.7 1.7 1.7  PHOS 2.4* 2.8  --   --  3.1 3.1 2.7   GFR: Estimated Creatinine Clearance: 130.7 mL/min (by C-G formula based on SCr of 0.79 mg/dL). Recent Labs  Lab 07/27/19 0356 07/28/19 0522 07/28/19 [id number] 07/29/19 0243  WBC 10.8* 8.2 8.2 7.3    Liver Function Tests: Recent Labs  Lab 07/23/19 1623 07/28/19 0959  AST 69* 54*  ALT 96* 76*  ALKPHOS 102 109  BILITOT 1.7* 0.9  PROT 6.3* 5.8*  ALBUMIN 2.1* 1.7*   No results for input(s): LIPASE, AMYLASE in the last 168 hours. Recent Labs  Lab 07/23/19 1623  AMMONIA 27    ABG    Component Value Date/Time   PHART 7.404 07/26/2019 1719   PCO2ART 33.8 07/26/2019 1719   PO2ART 180.0 (H) 07/26/2019 1719   HCO3 21.2 07/26/2019 1719   TCO2 22 07/26/2019 1719   ACIDBASEDEF 3.0 (H) 07/26/2019 1719   O2SAT 100.0 07/26/2019 1719     Coagulation Profile: No results for input(s): INR, PROTIME in the last 168 hours.  Cardiac Enzymes: No results for input(s): CKTOTAL, CKMB, CKMBINDEX, TROPONINI in the last 168 hours.  HbA1C: Hgb A1c MFr Bld  Date/Time Value Ref Range Status  07/27/2019 03:56 AM 5.2 4.8 - 5.6 % Final    Comment:    (NOTE) Pre diabetes:          5.7%-6.4% Diabetes:              >6.4% Glycemic control for   <7.0% adults with diabetes   07/13/2019 04:08 PM 6.3 (H) 4.8 - 5.6 %  Final    Comment:    (NOTE) Pre diabetes:          5.7%-6.4% Diabetes:              >6.4% Glycemic control for   <7.0% adults with diabetes     CBG: Recent Labs  Lab 07/28/19 1545 07/28/19 1922 07/28/19 2221 07/29/19 0312 07/29/19 0638  GLUCAP 151* 154* 162* 171* 156*     Critical care time:   CRITICAL CARE Performed by: Johnsie Cancel   Total critical care time: 35 minutes  Critical care time was exclusive of separately billable procedures and treating other patients.  Critical care was necessary to treat or prevent imminent or life-threatening deterioration.  Critical care was time spent personally by me on the following activities: development of treatment plan with patient and/or surrogate as well as nursing, discussions with consultants, evaluation of patient's response to treatment, examination of patient, obtaining history from patient or surrogate, ordering and performing treatments and interventions, ordering and review of laboratory studies, ordering and review of radiographic studies, pulse oximetry and re-evaluation of patient's condition.  Johnsie Cancel, NP-C [facility] Contact / Pager information can be found on Amion  07/29/2019, 8:43 AM

## 2019-07-29 NOTE — Progress Notes (Signed)
ANTICOAGULATION CONSULT NOTE - Initial Consult  Pharmacy Consult for Heparin  Indication: atrial fibrillation  Allergies  Allergen Reactions  . Vancomycin Shortness Of Breath and Rash    [name]     Patient Measurements: Height: 6\' 3"  (190.5 cm) Weight: 226 lb 10.1 oz (102.8 kg) IBW/kg (Calculated) : 84.5  Vital Signs: Temp: 100.1 F (37.8 C) (01/01 0318) Temp Source: Oral (01/01 0318) BP: 118/81 (01/01 0600) Pulse Rate: 91 (01/01 0600)  Labs: Recent Labs    07/28/19 0522 07/28/19 0959 07/29/19 0243  HGB 8.3* 8.7* 8.2*  HCT 26.3* 28.0* 26.4*  PLT 238 228 187  CREATININE 0.82 0.84 0.79    Estimated Creatinine Clearance: 130.7 mL/min (by C-G formula based on SCr of 0.79 mg/dL).   Medical History: Past Medical History:  Diagnosis Date  . Anxiety    takes Xanax daily  . Arthritis   . Chronic back pain    DDD  . Complication of anesthesia    agitated when awaking   . H/O rotator cuff tear   . History of kidney stones   . Hypertension    takes Toprol,Prinizide,and Amlodipine daily  . Joint pain   . Joint swelling   . Laceration    history of to right lower extermity   . Night muscle spasms    takes Soma daily  . Numbness and tingling    hands and feet   . Pneumonia    history of   . Tinnitus     Assessment: Anti-coagulation for afib held earlier this admission for surgical site bleeding, Hgb low but stable for a few days, re-starting heparin with close monitoring of H/H. Will aim for lower goal.  Goal of Therapy:  Heparin level 0.3-0.5 units/ml, recent bleeding Monitor platelets by anticoagulation protocol: Yes   Plan:  Start heparin drip at 2200 units/hr (previously required higher doses than this) Check heparin level at 1500 Daily CBC/HL Monitor closely for bleeding  Narda Bonds, PharmD, [facility] Pharmacist Phone: [phone]

## 2019-07-30 LAB — BASIC METABOLIC PANEL
Anion gap: 12 (ref 5–15)
BUN: 21 mg/dL — ABNORMAL HIGH (ref 6–20)
CO2: 22 mmol/L (ref 22–32)
Calcium: 7.4 mg/dL — ABNORMAL LOW (ref 8.9–10.3)
Chloride: 105 mmol/L (ref 98–111)
Creatinine, Ser: 0.93 mg/dL (ref 0.61–1.24)
GFR calc Af Amer: >60 mL/min (ref >60)
GFR calc non Af Amer: >60 mL/min (ref >60)
Glucose, Bld: 200 mg/dL — ABNORMAL HIGH (ref 70–99)
Potassium: 3.3 mmol/L — ABNORMAL LOW (ref 3.5–5.1)
Sodium: 139 mmol/L (ref 135–145)

## 2019-07-30 LAB — CBC
HCT: 27.3 % — ABNORMAL LOW (ref 39.0–52.0)
Hemoglobin: 8.4 g/dL — ABNORMAL LOW (ref 13.0–17.0)
MCH: 28.8 pg (ref 26.0–34.0)
MCHC: 30.8 g/dL (ref 30.0–36.0)
MCV: 93.5 fL (ref 80.0–100.0)
Platelets: 201 10*3/uL (ref 150–400)
RBC: 2.92 MIL/uL — ABNORMAL LOW (ref 4.22–5.81)
RDW: 15.4 % (ref 11.5–15.5)
WBC: 9.9 10*3/uL (ref 4.0–10.5)
nRBC: 0 % (ref 0.0–0.2)

## 2019-07-30 LAB — GLUCOSE, CAPILLARY
Glucose-Capillary: 145 mg/dL — ABNORMAL HIGH (ref 70–99)
Glucose-Capillary: 160 mg/dL — ABNORMAL HIGH (ref 70–99)
Glucose-Capillary: 167 mg/dL — ABNORMAL HIGH (ref 70–99)
Glucose-Capillary: 169 mg/dL — ABNORMAL HIGH (ref 70–99)
Glucose-Capillary: 170 mg/dL — ABNORMAL HIGH (ref 70–99)
Glucose-Capillary: 186 mg/dL — ABNORMAL HIGH (ref 70–99)

## 2019-07-30 LAB — MAGNESIUM: Magnesium: 1.5 mg/dL — ABNORMAL LOW (ref 1.7–2.4)

## 2019-07-30 LAB — HEPARIN LEVEL (UNFRACTIONATED)
Heparin Unfractionated: 0.27 IU/mL — ABNORMAL LOW (ref 0.30–0.70)
Heparin Unfractionated: 0.41 IU/mL (ref 0.30–0.70)

## 2019-07-30 LAB — TRIGLYCERIDES: Triglycerides: 415 mg/dL — ABNORMAL HIGH (ref <150)

## 2019-07-30 MED ORDER — DEXMEDETOMIDINE HCL IN NACL 400 MCG/100ML IV SOLN
0.4000 ug/kg/h | INTRAVENOUS | Status: AC
  Administered 2019-07-30: 0.4 ug/kg/h via INTRAVENOUS
  Administered 2019-07-30: 0.9 ug/kg/h via INTRAVENOUS
  Administered 2019-07-30: 0.8 ug/kg/h via INTRAVENOUS
  Administered 2019-07-31: 0.7 ug/kg/h via INTRAVENOUS
  Administered 2019-07-31: 0.8 ug/kg/h via INTRAVENOUS
  Filled 2019-07-30 (×4): qty 100

## 2019-07-30 MED ORDER — POTASSIUM CHLORIDE 20 MEQ/15ML (10%) PO SOLN
20.0000 meq | ORAL | Status: AC
  Administered 2019-07-30 (×3): 20 meq
  Filled 2019-07-30 (×3): qty 15

## 2019-07-30 MED ORDER — ACETAMINOPHEN 325 MG PO TABS
650.0000 mg | ORAL_TABLET | Freq: Four times a day (QID) | ORAL | Status: DC | PRN
  Administered 2019-07-30: 11:00:00 650 mg via ORAL
  Filled 2019-07-30 (×2): qty 2

## 2019-07-30 MED ORDER — MAGNESIUM SULFATE 4 GM/100ML IV SOLN
4.0000 g | Freq: Once | INTRAVENOUS | Status: AC
  Administered 2019-07-30: 4 g via INTRAVENOUS
  Filled 2019-07-30: qty 100

## 2019-07-30 MED ORDER — INSULIN ASPART 100 UNIT/ML ~~LOC~~ SOLN
0.0000 [IU] | SUBCUTANEOUS | Status: DC
  Administered 2019-07-30 (×2): 1 [IU] via SUBCUTANEOUS
  Administered 2019-07-31: 2 [IU] via SUBCUTANEOUS
  Administered 2019-07-31 – 2019-08-03 (×2): 1 [IU] via SUBCUTANEOUS

## 2019-07-30 MED ORDER — FUROSEMIDE 10 MG/ML IJ SOLN
20.0000 mg | Freq: Once | INTRAMUSCULAR | Status: AC
  Administered 2019-07-30: 20 mg via INTRAVENOUS
  Filled 2019-07-30: qty 2

## 2019-07-30 NOTE — Progress Notes (Addendum)
NAME:  Vernon Frye, MRN:  [id number], DOB:  06-04-1961, LOS: 21 ADMISSION DATE:  07/12/2019, CONSULTATION DATE:  07/13/19 REFERRING MD:  [place] - [facility], CHIEF COMPLAINT:  L knee pain  Brief History   59 yo M PMH Afib, HTN, L TKR (2016) who presented to ED with CC L knee pain. 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   Underwent left knee revision and replacement with placement of antibiotic spacer 12/20, patient remained intubated post procedure.  Overnight of 01/01 patient seen with spike in temperature of 102.6, tachycardia, and hypertension with development of very loose mucousy stools.  Given extended use of IV antibiotics concern for C. difficile.    Past Medical History  A fib HTN Anxiety L TKR  L shoulder arthroscopy  DDD  Significant Hospital Events   12/16 presents to ED for L knee pain. Concern for septic joint. A fiv RVR. Developed SOB after vanc administration. PCCM consulted for admission Underwent left knee Irrigation and debridement, total knee with poly exchange     12/29: L knee revision and replacement, remains intubated post operatively   01/02: Overnight of 01/01 patient seen with spike in temperature of 102.6, tachycardia, and hypertension with development of very loose mucousy stools.  Given extended use of IV antibiotics concern for C. difficile.    Consults:  Ortho  Cardiology ID EP  Procedures:  ETT 12/29 >>   Significant Diagnostic Tests:  12/19 ECHO > LVEF 55-60%  12/15 L knee XR > prior L knee replacement. Moderate joint effusion, no body abnormality   12/16 Renal ultrasound > Mild left-sided hydronephrosis is noted.  12/21 MRI lumbar spine DJD, no neural impingement, no discitis  12/24 UE venous: Right: No evidence of thrombosis in the subclavian. Left:No evidence of deep vein thrombosis in the upper extremity. Findings consistent with acute superficial vein  thrombosis involving the left cephalic vein.  12/27 cth: No acute intracranial abnormality. Non contrast CT appearance of the brain within normal limits for age. 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  Micro Data:  12/16 SARS Cov2 > negative  12/16 BCx > positive Staph MRSA  12/16 Strep A > Negative  12/16 Flu A/B > neg 12/16 MRSA PCR > positive  12/16 Body culture left knee > Abundant gram positive cocci  12/18 blood: negative  Antimicrobials:  12/16 vanc x1 12/16 ceftriaxone x1 12/16 Zosyn > 12/16 Daptomycin 12/17 >12/21 vanc 12/22 > 6 weeks planned thru 09/06/2019  Interim history/subjective:  RN reports development of watery/mucous stool overnight with spike in fever   Objective   Blood pressure (!) 141/80, pulse (!) 127, temperature (!) 102.6 F (39.2 C), temperature source Axillary, resp. rate (!) 25, height 6\' 3"  (1.905 m), weight 103.3 kg, SpO2 93 %.    Vent Mode: PRVC FiO2 (%):  [30 %] 30 % Set Rate:  [16 bmp] 16 bmp Vt Set:  [670 mL] 670 mL PEEP:  [5 cmH20] 5 cmH20 Pressure Support:  [5 cmH20] 5 cmH20 Plateau Pressure:  [18 cmH20-24 cmH20] 20 cmH20   Intake/Output Summary (Last 24 hours) at 07/30/2019 1118 Last data filed at 07/30/2019 0800 Gross per 24 hour  Intake 3010.83 ml  Output 3575 ml  Net -564.17 ml   Filed Weights   07/28/19 0450 07/29/19 0334 07/30/19 0500  Weight: 102.8 kg 102.8 kg 103.3 kg    Examination: General: Chronically ill appearing elderly male on mechanical ventilation,  in NAD HEENT: ETT, MM pink/moist, PERRL,  Neuro: Alert and able to follow commands, no focal CV: s12 regular rate and rhythm, no murmur, rubs, or gallops,  PULM: Clear to auscultation bilaterally, no increased work of breathing, no added breath sound GI: soft, bowel sounds active in all 4 quadrants, non-tender, non-distended Extremities: warm/dry, no edema  Skin: no rashes or lesions  Resolved Hospital Problem list   Acute  kidney injury  Assessment & Plan:  Post operative resp insuff:  -reportedly 2/2 mental status P: Continue ventilator support with lung protective strategies  Continues to tolerate wean, likely can extubate today Wean PEEP and FiO2 for sats greater than 90%. Head of bed elevated 30 degrees. Plateau pressures less than 30 cm H20.  Follow intermittent chest x-ray and ABG.   SAT/SBT as tolerated, mentation preclude extubation  Ensure adequate pulmonary hygiene  Follow cultures  VAP bundle in place  PAD protocol  Acute encephalopathy secondary to agitated delirium - multifactorial could also be from polysubstance abuse or etoh withdrawal. +/- sepsis P Agitation and confusion improving Continue thiamine, folate, multivitamin Discontinue propofol drip, continue as needed fentanyl and Precedex Delirium precautions  MRSA Bacteremia  -resolved bacteremia, on 6 weeks vanc per ID -repeat cx negative 12/18 -MRI negative for spinal abscess or osteomyelitis.  -TEE refused by pt and per ID note will not change plan until intervention from CTS needed and not likely a good candidate for that.  Febrile with tachycardia P Continue IV antibiotics per ID PICC line placed 01/01 As above developed tachycardia and spiking temperature to 102.6 with development of watery diarrhea, concern for C. Difficile Obtain C. difficile PCR Enteric precautions Re-culture  Afib RVR -Non-adherent w eliquis outpatient (2/2 financial constraints) -Per cardiology, not candidate for dccv P  Cardiology following, appreciate assistance Continue amiodarone drip Esmolol drip discontinued As needed beta-blockers IV heparin  Left Knee Septic joint -suspect septic joint in setting of L TKR in 2016 -Underwent I&D with partial hardwear exchange 12/16 -Total knee revision with removal of left total knee replacement and insertion of antibiotic spacers 12/29 P Management per Ortho PT/OT once able  Hx EtOH use -Hx  c/w prior heavy use Substance Use  -denies substance use; UDS pos for Amphetamines, BZD, Opioids (Home opioids for pain, BZD for anxiety) P Precedex drip and as needed fentanyl as above Continue thiamine, folate, and multivitamin Scheduled Valium and Seroquel As needed Haldol   Prediabetes -a1c 6.3 P: Monitor  Normocytic anemia:  P:  Stable, anticoagulation resumed 1/1  Best practice:  Diet: Tube feeds Pain/Anxiety/Delirium protocol (if indicated): per protocol VAP protocol (if indicated): per protocol DVT prophylaxis: held GI prophylaxis: PPI  Glucose control: monitor Mobility: bed rest Code Status: Full  Family Communication:  Disposition: ICU  Labs   CBC: Recent Labs  Lab 07/27/19 0356 07/28/19 0522 07/28/19 0959 07/29/19 0243 07/30/19 1043  WBC 10.8* 8.2 8.2 7.3 9.9  HGB 10.1* 8.3* 8.7* 8.2* 8.4*  HCT 31.2* 26.3* 28.0* 26.4* 27.3*  MCV 92.9 92.9 92.4 93.3 93.5  PLT 234 238 228 187 [id number]    Basic Metabolic Panel: Recent Labs  Lab 07/25/19 0653 07/26/19 0404 07/26/19 1719 07/28/19 0522 07/28/19 0959 07/29/19 0243 07/29/19 1700 07/30/19 0506  NA 145 144  --  142 144 143 142 139  K 3.7 3.3*  --  3.3* 3.6 3.6 3.3* 3.3*  CL 112* 113*   < > 110 114* 114* 107 105  CO2 20* 22   < > 23 22  21* 25 22  GLUCOSE 144* 144*   < > 194* 166* 191* 146* 200*  BUN 13 15   < > 18 18 18 17  21*  CREATININE 0.73 0.70   < > 0.82 0.84 0.79 0.84 0.93  CALCIUM 7.8* 7.9*   < > 7.9* 8.0* 7.7* 7.5* 7.4*  MG 1.6* 1.6*   < > 1.7 1.7 1.7 1.6* 1.5*  PHOS 2.4* 2.8  --  3.1 3.1 2.7  --   --    < > = values in this interval not displayed.   GFR: Estimated Creatinine Clearance: 112.7 mL/min (by C-G formula based on SCr of 0.93 mg/dL). Recent Labs  Lab 07/28/19 0522 07/28/19 0959 07/29/19 0243 07/30/19 1043  WBC 8.2 8.2 7.3 9.9    Liver Function Tests: Recent Labs  Lab 07/23/19 1623 07/28/19 0959  AST 69* 54*  ALT 96* 76*  ALKPHOS 102 109  BILITOT 1.7* 0.9  PROT 6.3*  5.8*  ALBUMIN 2.1* 1.7*   No results for input(s): LIPASE, AMYLASE in the last 168 hours. Recent Labs  Lab 07/23/19 1623  AMMONIA 27    ABG    Component Value Date/Time   PHART 7.404 07/26/2019 1719   PCO2ART 33.8 07/26/2019 1719   PO2ART 180.0 (H) 07/26/2019 1719   HCO3 21.2 07/26/2019 1719   TCO2 22 07/26/2019 1719   ACIDBASEDEF 3.0 (H) 07/26/2019 1719   O2SAT 100.0 07/26/2019 1719     Coagulation Profile: No results for input(s): INR, PROTIME in the last 168 hours.  Cardiac Enzymes: No results for input(s): CKTOTAL, CKMB, CKMBINDEX, TROPONINI in the last 168 hours.  HbA1C: Hgb A1c MFr Bld  Date/Time Value Ref Range Status  07/27/2019 03:56 AM 5.2 4.8 - 5.6 % Final    Comment:    (NOTE) Pre diabetes:          5.7%-6.4% Diabetes:              >6.4% Glycemic control for   <7.0% adults with diabetes   07/13/2019 04:08 PM 6.3 (H) 4.8 - 5.6 % Final    Comment:    (NOTE) Pre diabetes:          5.7%-6.4% Diabetes:              >6.4% Glycemic control for   <7.0% adults with diabetes     CBG: Recent Labs  Lab 07/29/19 1155 07/29/19 2015 07/29/19 2243 07/30/19 0636 07/30/19 1105  GLUCAP 145* 160* 164* 160* 169*     Critical care time:   CRITICAL CARE Performed by: Johnsie Cancel   Total critical care time: 40 minutes  Critical care time was exclusive of separately billable procedures and treating other patients.  Critical care was necessary to treat or prevent imminent or life-threatening deterioration.  Critical care was time spent personally by me on the following activities: development of treatment plan with patient and/or surrogate as well as nursing, discussions with consultants, evaluation of patient's response to treatment, examination of patient, obtaining history from patient or surrogate, ordering and performing treatments and interventions, ordering and review of laboratory studies, ordering and review of radiographic studies, pulse oximetry  and re-evaluation of patient's condition.  Johnsie Cancel, NP-C [facility] Contact / Pager information can be found on Amion  07/30/2019, 11:18 AM   Attending Attestation: I have taken an interval history, reviewed the chart and examined the patient. I agree with the Advanced Practitioner's note, impression, and recommendations as outlined.  Briefly, 59 year old male with hx of left total knee replacement, atrial fibrillation and hypertension initially admitted for sepsis secondary left septic joint associated with atrial fibrillation with RVR. He underwent L knee replacement on 12/29 and remained intubated post-op.   S: Febrile today. Remains intubated. Follows commands. Tolerated PSV. Currently on fulls support  Blood pressure 104/67, pulse 91, temperature 99.2 F (37.3 C), temperature source Oral, resp. rate 16, height 6\' 3"  (1.905 m), weight 103.3 kg, SpO2 100 %.  On my exam, chronically ill appearing male on mechanical ventilation. Awake, follows commands. Lungs diminished bilaterally, no rhonchi or wheezing. 1+ pedal edema.  Labs, imaging and cultures in last 48 hours reviewed by me.  [place] 07/13/19 - MRSA BCX 07/14/19 - NGTD. Final  Assessment/Plan  Acute hypoxemic respiratory failure/Post-op failure - Diurese today. SBT in am and consider extubation Acute encephalopathy - improved. Continue Precedex. Wean off propofol and fentanyl MRSA bacteremia - Cultures negative since 12/17. IV antibiotics per ID. Obtain TEE Septic joint of the left knee s/p revision and I&D - Post-op management per Ortho AFRVR - secondary to sepsis vs agitation from withdrawal. Continue amio per Cardiology. Consult team also recommends TEE when no longer acutely ill in the ICU  The patient is critically ill with multiple organ systems failure and requires high complexity decision making for assessment and support, frequent evaluation and titration of therapies, application of  advanced monitoring technologies and extensive interpretation of multiple databases.   Critical Care Time devoted to patient care services described in this note is 36 Minutes. This time reflects time of care of this signee Dr. Rodman Pickle. This critical care time does not reflect procedure time, or teaching time or supervisory time of PA/NP/Med student/Med Resident etc but could involve care discussion time.  Rodman Pickle, M.D. [facility] 07/30/2019 5:01 PM   Please see Amion for pager number to reach on-call Pulmonary and Critical Care Team.

## 2019-07-30 NOTE — Progress Notes (Signed)
eLink Physician-Brief Progress Note Patient Name: Vernon Frye DOB: 07-Nov-1960 MRN: [id number]   Date of Service  07/30/2019  HPI/Events of Note  Patient intubated and ventilated. Currently on AC/HS insulin Rx.   eICU Interventions  Will order: 1. D/C AC/HS Novolog SSI. 2. Q 4 hour very sensitive Novolog SSI.     Intervention Category Major Interventions: Hyperglycemia - active titration of insulin therapy  Lysle Dingwall 07/30/2019, 9:36 PM

## 2019-07-30 NOTE — Progress Notes (Signed)
Cardiology Progress Note  Patient ID: Vernon Frye MRN: [id number] DOB: March 07, 1961 Date of Encounter: 07/30/2019  Primary Cardiologist: Sinclair Grooms, MD  Subjective  Febrile, agitated on vent. Remains in Afib with RVR.   ROS:  All other ROS reviewed and negative. Pertinent positives noted in the HPI.     Inpatient Medications  Scheduled Meds: . chlorhexidine gluconate (MEDLINE KIT)  15 mL Mouth Rinse BID  . Chlorhexidine Gluconate Cloth  6 each Topical Daily  . diazepam  5 mg Per Tube BID  . docusate  100 mg Per Tube BID  . feeding supplement (PRO-STAT SUGAR FREE 64)  60 mL Per Tube BID  . folic acid  1 mg Per Tube Daily  . insulin aspart  0-5 Units Subcutaneous QHS  . insulin aspart  0-6 Units Subcutaneous TID WC  . lisinopril  10 mg Per Tube Daily  . mouth rinse  15 mL Mouth Rinse 10 times per day  . metoprolol tartrate  2.5 mg Intravenous Q6H  . multivitamin with minerals  1 tablet Per Tube Daily  . pantoprazole (PROTONIX) IV  40 mg Intravenous Q24H  . potassium chloride  20 mEq Per Tube Q4H  . QUEtiapine  25 mg Per Tube BID  . sodium chloride flush  10-40 mL Intracatheter Q12H  . thiamine  100 mg Per Tube Daily   Or  . thiamine  100 mg Intravenous Daily   Continuous Infusions: . amiodarone 30 mg/hr (07/30/19 0800)  . dexmedetomidine (PRECEDEX) IV infusion    . feeding supplement (VITAL 1.5 CAL) 40 mL/hr at 07/30/19 0200  . heparin 2,800 Units/hr (07/30/19 0800)  . magnesium sulfate bolus IVPB    . vancomycin 62.5 mL/hr at 07/30/19 0800   PRN Meds: acetaminophen, docusate, fentaNYL (SUBLIMAZE) injection, fentaNYL (SUBLIMAZE) injection, haloperidol lactate, LORazepam, LORazepam **OR** LORazepam, menthol-cetylpyridinium **OR** phenol, ondansetron **OR** ondansetron (ZOFRAN) IV, Resource ThickenUp Clear, sodium chloride flush, sodium chloride flush   Vital Signs   Vitals:   07/30/19 0800 07/30/19 0900 07/30/19 0947 07/30/19 1000  BP:  135/86  (!) 141/80    Pulse: 96 (!) 103  (!) 127  Resp: 20 (!) 23  (!) 25  Temp:   (!) 102.6 F (39.2 C)   TempSrc:   Axillary   SpO2: 100% 100%  93%  Weight:      Height:        Intake/Output Summary (Last 24 hours) at 07/30/2019 1125 Last data filed at 07/30/2019 0800 Gross per 24 hour  Intake 3010.83 ml  Output 3575 ml  Net -564.17 ml   Last 3 Weights 07/30/2019 07/29/2019 07/28/2019  Weight (lbs) 227 lb 11.8 oz 226 lb 10.1 oz 226 lb 10.1 oz  Weight (kg) 103.3 kg 102.8 kg 102.8 kg      Telemetry  Overnight telemetry shows afib with RVR 130-170, which I personally reviewed.    Physical Exam   Vitals:   07/30/19 0800 07/30/19 0900 07/30/19 0947 07/30/19 1000  BP:  135/86  (!) 141/80  Pulse: 96 (!) 103  (!) 127  Resp: 20 (!) 23  (!) 25  Temp:   (!) 102.6 F (39.2 C)   TempSrc:   Axillary   SpO2: 100% 100%  93%  Weight:      Height:         Intake/Output Summary (Last 24 hours) at 07/30/2019 1125 Last data filed at 07/30/2019 0800 Gross per 24 hour  Intake 3010.83 ml  Output 3575 ml  Net -564.17  ml    Last 3 Weights 07/30/2019 07/29/2019 07/28/2019  Weight (lbs) 227 lb 11.8 oz 226 lb 10.1 oz 226 lb 10.1 oz  Weight (kg) 103.3 kg 102.8 kg 102.8 kg    Body mass index is 28.46 kg/m.  General: intubated/sedated on vent Head: Atraumatic, normal size  Eyes: PEERLA, EOMI  Neck: Supple, no JVD Endocrine: No thryomegaly Cardiac: irregular rhythm, no m/r/g Lungs: diminished breath sounds bilaterally   Abd: Soft, nontender, no hepatomegaly  Ext: swollen LEs, L > R, clean dry incision over L knee  Musculoskeletal: No deformities, BUE and BLE strength normal and equal Skin: warm/swollen extremities  Neuro: intubated/sedated on vent  Labs  High Sensitivity Troponin:  No results for input(s): TROPONINIHS in the last 720 hours.   Cardiac EnzymesNo results for input(s): TROPONINI in the last 168 hours. No results for input(s): TROPIPOC in the last 168 hours.  Chemistry Recent Labs  Lab 07/23/19 1623  07/28/19 0959 07/29/19 0243 07/29/19 1700 07/30/19 0506  NA 139 144 143 142 139  K 3.2* 3.6 3.6 3.3* 3.3*  CL 107 114* 114* 107 105  CO2 23 22 21* 25 22  GLUCOSE 152* 166* 191* 146* 200*  BUN '13 18 18 17 ' 21*  CREATININE 0.72 0.84 0.79 0.84 0.93  CALCIUM 7.7* 8.0* 7.7* 7.5* 7.4*  PROT 6.3* 5.8*  --   --   --   ALBUMIN 2.1* 1.7*  --   --   --   AST 69* 54*  --   --   --   ALT 96* 76*  --   --   --   ALKPHOS 102 109  --   --   --   BILITOT 1.7* 0.9  --   --   --   GFRNONAA >60 >60 >60 >60 >60  GFRAA >60 >60 >60 >60 >60  ANIONGAP '9 8 8 10 12    ' Hematology Recent Labs  Lab 07/28/19 0959 07/29/19 0243 07/30/19 1043  WBC 8.2 7.3 9.9  RBC 3.03* 2.83* 2.92*  HGB 8.7* 8.2* 8.4*  HCT 28.0* 26.4* 27.3*  MCV 92.4 93.3 93.5  MCH 28.7 29.0 28.8  MCHC 31.1 31.1 30.8  RDW 15.9* 15.9* 15.4  PLT 228 187 201   BNPNo results for input(s): BNP, PROBNP in the last 168 hours.  DDimer No results for input(s): DDIMER in the last 168 hours.   Radiology  DG CHEST PORT 1 VIEW  Result Date: 07/29/2019 CLINICAL DATA:  Check PICC line placement EXAM: PORTABLE CHEST 1 VIEW COMPARISON:  Film from earlier in the same day. FINDINGS: Endotracheal tube and gastric catheter are again noted and stable. Cardiac shadow is unchanged. Mild left basilar atelectasis and effusion is noted. The right-sided PICC line is again identified and the tip has been uncoiled and now lies at the cavoatrial junction. IMPRESSION: PICC line is been repositioned and now lies at the cavoatrial junction. The remainder of the exam is stable from prior study. Electronically Signed   By: Inez Catalina M.D.   On: 07/29/2019 17:37   DG CHEST PORT 1 VIEW  Result Date: 07/29/2019 CLINICAL DATA:  PICC line not working. EXAM: PORTABLE CHEST 1 VIEW COMPARISON:  Earlier today at 5:50 a.m. FINDINGS: 4:25 p.m. Right-sided PICC line is abnormally positioned, likely with its tip in the azygos vein. Endotracheal tube terminates 5.1 cm above carina.  Nasogastric tube extends beyond the inferior aspect of the film. Normal heart size. Decrease to resolution of left pleural effusion. No pneumothorax.  No congestive failure. Persistent left base atelectasis. IMPRESSION: New right PICC line, abnormally positioned, with tip likely in the azygos vein. Recommend repositioning. Improved to resolved left pleural effusion with persistent left base atelectasis. These results will be called to the ordering clinician or representative by the Radiologist Assistant, and communication documented in the PACS or zVision Dashboard. Electronically Signed   By: Abigail Miyamoto M.D.   On: 07/29/2019 16:46   DG Chest Port 1 View  Result Date: 07/29/2019 CLINICAL DATA:  Acute respiratory failure. EXAM: PORTABLE CHEST 1 VIEW COMPARISON:  07/28/19 FINDINGS: ETT tip above the carina. There is a NG tube with tip below the GE junction. Normal heart size. Small left pleural effusion. Retrocardiac opacity is identified compatible with atelectasis and or pneumonia. IMPRESSION: 1. Stable support apparatus. No change in aeration to the left lung base compared with previous exam. 2. Small left pleural effusion. Electronically Signed   By: Kerby Moors M.D.   On: 07/29/2019 09:41   VAS [place] LOWER EXTREMITY VENOUS (DVT)  Result Date: 07/29/2019  Lower Venous Study Indications: Edema, and septic knee joint.  Limitations: Body habitus, poor ultrasound/tissue interface, bandages, orthopaedic appliance and vented. Comparison Study: No prior study. Performing Technologist: Maudry Mayhew MHA, RDMS, RVT, RDCS  Examination Guidelines: A complete evaluation includes B-mode imaging, spectral Doppler, color Doppler, and power Doppler as needed of all accessible portions of each vessel. Bilateral testing is considered an integral part of a complete examination. Limited examinations for reoccurring indications may be performed as noted.   +-----+---------------+---------+-----------+----------+--------------+ RIGHTCompressibilityPhasicitySpontaneityPropertiesThrombus Aging +-----+---------------+---------+-----------+----------+--------------+ CFV                                               Not visualized +-----+---------------+---------+-----------+----------+--------------+   +---------+---------------+---------+-----------+----------+--------------+ LEFT     CompressibilityPhasicitySpontaneityPropertiesThrombus Aging +---------+---------------+---------+-----------+----------+--------------+ CFV      Full           Yes      Yes                                 +---------+---------------+---------+-----------+----------+--------------+ SFJ      Full                                                        +---------+---------------+---------+-----------+----------+--------------+ FV Prox  Full                                                        +---------+---------------+---------+-----------+----------+--------------+ FV Mid   Full                                                        +---------+---------------+---------+-----------+----------+--------------+ FV DistalFull                                                        +---------+---------------+---------+-----------+----------+--------------+  PFV      Full                                                        +---------+---------------+---------+-----------+----------+--------------+ POP                                                   Not visualized +---------+---------------+---------+-----------+----------+--------------+ PTV      Full                    Yes                                 +---------+---------------+---------+-----------+----------+--------------+ PERO     Full                    Yes                                  +---------+---------------+---------+-----------+----------+--------------+ Limited visualization of left calf veins.    Summary: Left: There is no evidence of deep vein thrombosis in the lower extremity. However, portions of this examination were limited- see technologist comments above.  *See table(s) above for measurements and observations. Electronically signed by Deitra Mayo MD on 07/29/2019 at 5:24:29 PM.    Final    VAS [place] UPPER EXTREMITY VENOUS DUPLEX  Result Date: 07/29/2019 UPPER VENOUS STUDY  Indications: Edema, and septic left knee joint. Limitations: Body habitus, poor ultrasound/tissue interface and sedated. Comparison Study: Left upper extremity venous duplex 07/21/2019- negative for                   DVT, superficial vein thrombosis left cephalic vein. Performing Technologist: Maudry Mayhew MHA, RDMS, RVT, RDCS  Examination Guidelines: A complete evaluation includes B-mode imaging, spectral Doppler, color Doppler, and power Doppler as needed of all accessible portions of each vessel. Bilateral testing is considered an integral part of a complete examination. Limited examinations for reoccurring indications may be performed as noted.  Right Findings: +----------+------------+---------+-----------+----------+--------------+ RIGHT     CompressiblePhasicitySpontaneousProperties   Summary     +----------+------------+---------+-----------+----------+--------------+ Subclavian                                          Not visualized +----------+------------+---------+-----------+----------+--------------+  Left Findings: +----------+------------+---------+-----------+----------+-------+ LEFT      CompressiblePhasicitySpontaneousPropertiesSummary +----------+------------+---------+-----------+----------+-------+ IJV                      Yes       Yes                      +----------+------------+---------+-----------+----------+-------+ Subclavian    Full        Yes       Yes                      +----------+------------+---------+-----------+----------+-------+ Axillary      Full       Yes  Yes                      +----------+------------+---------+-----------+----------+-------+ Brachial      Full       Yes       Yes                      +----------+------------+---------+-----------+----------+-------+ Radial        Full                                          +----------+------------+---------+-----------+----------+-------+ Ulnar         Full                                          +----------+------------+---------+-----------+----------+-------+ Cephalic      None                                   Acute  +----------+------------+---------+-----------+----------+-------+ Basilic       Full                                          +----------+------------+---------+-----------+----------+-------+  Summary:  Left: No evidence of deep vein thrombosis in the upper extremity. Findings consistent with acute superficial vein thrombosis involving the left cephalic vein. No significant change when compared to prior study.  *See table(s) above for measurements and observations.  Diagnosing physician: Deitra Mayo MD Electronically signed by Deitra Mayo MD on 07/29/2019 at 5:24:39 PM.    Final    [place] EKG SITE RITE  Result Date: 07/29/2019 If Site Rite image not attached, placement could not be confirmed due to current cardiac rhythm.   Cardiac Studies  TTE 07/13/2019  FINDINGS  Left Ventricle: Left ventricular ejection fraction, by visual estimation, is 55 to 60%. The left ventricle has normal function. The left ventricle demonstrates regional wall motion abnormalities. The left ventricular internal cavity size was the left  ventricle is normal in size. There is mildly increased left ventricular hypertrophy. Abnormal (paradoxical) septal motion, consistent with right ventricular volume overload and/or  elevated RV end-diastolic pressure. The left ventricular diastology could  not be evaluated due to atrial fibrillation. Left ventricular diastolic function could not be evaluated.  Right Ventricle: The right ventricular size is severely enlarged. No increase in right ventricular wall thickness. Global RV systolic function is has severely reduced systolic function.  Left Atrium: Left atrial size was normal in size.  Right Atrium: Right atrial size was mild-moderately dilated  Pericardium: Trivial pericardial effusion is present. Presence of pericardial fat pad.  Mitral Valve: The mitral valve is degenerative in appearance. Mild mitral annular calcification. Trivial mitral valve regurgitation.  Tricuspid Valve: The tricuspid valve is grossly normal. Tricuspid valve regurgitation is trivial.  Aortic Valve: The aortic valve is tricuspid. Aortic valve regurgitation is not visualized. The aortic valve is structurally normal, with no evidence of sclerosis or stenosis.  Pulmonic Valve: The pulmonic valve was grossly normal. Pulmonic valve regurgitation is not visualized. Pulmonic regurgitation is not visualized.  Aorta: The aortic root and ascending aorta are structurally normal, with no evidence  of dilitation.  Venous: The inferior vena cava is normal in size with greater than 50% respiratory variability, suggesting right atrial pressure of 3 mmHg.  IAS/Shunts: No atrial level shunt detected by color flow Doppler.    LEFT VENTRICLE PLAX 2D LVIDd:         4.20 cm LVIDs:         2.90 cm LV PW:         1.50 cm LV IVS:        1.40 cm LVOT diam:     1.90 cm LV SV:         46 ml LV SV Index:   18.34 LVOT Area:     2.84 cm    LEFT ATRIUM            Index LA diam:      4.00 cm  1.63 cm/m LA Vol (A4C): 104.0 ml 42.39 ml/m    AORTA Ao Root diam: 2.70 cm  MITRAL VALVE MV Area (PHT): 5.34 cm            SHUNTS MV PHT:        41.18 msec          Systemic Diam: 1.90 cm MV  Decel Time: 142 msec MV E velocity: 88.83 cm/s 103 cm/s    Eleonore Chiquito MD Electronically signed by Eleonore Chiquito MD Signature Date/Time: 07/13/2019/8:36:27 AM       Final (Updating)    [employer] Images  Show images for ECHOCARDIOGRAM COMPLETE  Images on [employer]  Show images for [name]  Performing Technologist/Nurse  Performing Technologist/Nurse: Adrian Prince, RDCS  Reason for Exam Priority: STAT Not on file  Patient Data  BP 107/76 mmHg                            Surgical History  Surgical History  No past medical history on file.    Other Surgical History  Procedure Laterality Date Comment Source  HERNIA REPAIR Bilateral as a baby inguinal-   I & D KNEE WITH POLY EXCHANGE Left 07/13/2019 Procedure: IRRIGATION AND DEBRIDEMENT TOTAL KNEE WITH POLY EXCHANGE; Surgeon: Paralee Cancel, MD; Location: [place]; Service: Orthopedics; Laterality: Left;   LITHOTRIPSY      SHOULDER ARTHROSCOPY WITH SUBACROMIAL DECOMPRESSION AND OPEN ROTATOR C Left 01/16/2014 Procedure: LEFT SHOULDER ARTHROSCOPY WITH SUBACROMIAL DECOMPRESSION AND MINI OPEN ROTATOR CUFF REPAIR, OPEN DISTAL CLAVICLE RESECTION AND TENDODESIS; Surgeon: Augustin Schooling, MD; Location: [place]; Service: Orthopedics; Laterality: Left;   tens unit   for severe pain    TOTAL KNEE ARTHROPLASTY Left 06/19/2015 Procedure: LEFT TOTAL KNEE ARTHROPLASTY; Surgeon: Paralee Cancel, MD; Location: [facility]; Service: Orthopedics; Laterality: Left;   TOTAL KNEE REVISION Left 07/26/2019 Procedure: TOTAL KNEE REVISION removal of left total knee replacement with placement of antibiotic spacers; Surgeon: Paralee Cancel, MD; Location: [place]; Service: Orthopedics; Laterality: Left;     Implants   Implant  Capt Knee Total 3 - [id number] - Capitated Charge Entry (Left) Inventory item: CAPT KNEE TOTAL 3 ATTUNE Model/Cat number: [id number]  Manufacturer: [employer]    As of 06/19/2015 Status:  Capitated Charge Entry      Encounter-Level Documents - 07/12/2019:  Scan on 07/23/2019 9:59 AM by Desiree Hane, MD Scan on 07/23/2019 9:59 AM by Desiree Hane, MD Scan on 07/23/2019 9:59 AM by Desiree Hane, MD Scan on 07/26/2019 3:08 PM by Default, Provider, MD Scan on  07/29/2019 10:30 AM by Default, Provider, MD Document on 07/17/2019 10:56 AM by De Blanch, NT: ED PB Billing Extract Scan on 07/13/2019 3:26 AM by Shela Leff, MD Scan on 07/13/2019 10:37 AM by Default, Provider, MD Electronic signature on 07/12/2019 8:30 PM - E-signed     Order-Level Documents - 07/12/2019:  Scan on 07/29/2019 5:26 PM by Default, Provider, MD Scan on 07/29/2019 5:24 PM by Default, Provider, MD Scan on 07/21/2019 5:10 PM by Default, Provider, MD Scan on 07/13/2019 12:53 PM by Default, Provider, MD Scan on 07/13/2019 8:36 AM by Default, Provider, MD     Resulted by:  Signed Date/Time  Phone Pager  Eleonore Chiquito [facility] 07/16/2019 11:33 AM [phone]   External Result Report  External Result Report        Patient Profile  Vernon Frye is a 59 y.o. male with admitted with sepsis, infected L knee (prosthetic). Course complicated by MRSA bacteremia and Afib with RVR.   Assessment & Plan   1. Afib with RVR: secondary to sepsis. Febrile today and agitated. Question of withdrawal driving some of this. Would continue amiodarone drip for now. Fever, agitation, withdrawal will worsen Afib. Continue heparin drip while intubated. Once infection clears and he is extubated, can consider TEE/DCCV. Would not attempt this while acutely ill in the ICU.   For questions or updates, please contact [place] Please consult [email or web address] for contact info under        Signed, [place] T. Audie Box, [place]  07/30/2019 11:25 AM

## 2019-07-30 NOTE — Progress Notes (Signed)
[place] for Heparin  Indication: atrial fibrillation  Allergies  Allergen Reactions  . Vancomycin Shortness Of Breath and Rash    [name]     Patient Measurements: Height: 6\' 3"  (190.5 cm) Weight: 227 lb 11.8 oz (103.3 kg) IBW/kg (Calculated) : 84.5  Vital Signs: Temp: 99.2 F (37.3 C) (01/02 0421) Temp Source: Oral (01/02 0421) BP: 121/79 (01/02 0444) Pulse Rate: 91 (01/02 0444)  Labs: Recent Labs    07/28/19 0522 07/28/19 0959 07/29/19 0243 07/29/19 1700 07/30/19 0506  HGB 8.3* 8.7* 8.2*  --   --   HCT 26.3* 28.0* 26.4*  --   --   PLT 238 228 187  --   --   HEPARINUNFRC  --   --   --  0.10* 0.27*  CREATININE 0.82 0.84 0.79 0.84 0.93    Estimated Creatinine Clearance: 112.7 mL/min (by C-G formula based on SCr of 0.93 mg/dL).   Medical History: Past Medical History:  Diagnosis Date  . Anxiety    takes Xanax daily  . Arthritis   . Chronic back pain    DDD  . Complication of anesthesia    agitated when awaking   . H/O rotator cuff tear   . History of kidney stones   . Hypertension    takes Toprol,Prinizide,and Amlodipine daily  . Joint pain   . Joint swelling   . Laceration    history of to right lower extermity   . Night muscle spasms    takes Soma daily  . Numbness and tingling    hands and feet   . Pneumonia    history of   . Tinnitus     Assessment: Anti-coagulation for afib held earlier this admission for surgical site bleeding, Hgb low but stable for a few days, re-starting heparin with close monitoring of H/H. Will aim for lower goal.  1/2 AM update:  Heparin level just below goal   Goal of Therapy:  Heparin level 0.3-0.5 units/ml, recent bleeding Monitor platelets by anticoagulation protocol: Yes   Plan:  Inc heparin to 2800 units/hr Check heparin level at 1400 Daily CBC/HL Monitor closely for bleeding  Narda Bonds, PharmD, BCPS Clinical Pharmacist Phone: [phone]

## 2019-07-30 NOTE — Progress Notes (Signed)
[place] for Heparin  Indication: atrial fibrillation  Allergies  Allergen Reactions  . Vancomycin Shortness Of Breath and Rash    [name]     Patient Measurements: Height: 6\' 3"  (190.5 cm) Weight: 227 lb 11.8 oz (103.3 kg) IBW/kg (Calculated) : 84.5  Vital Signs: Temp: 101.7 F (38.7 C) (01/02 1139) Temp Source: Oral (01/02 1139) BP: 141/80 (01/02 1000) Pulse Rate: 127 (01/02 1000)  Labs: Recent Labs    07/28/19 0959 07/29/19 0243 07/29/19 1700 07/30/19 0506 07/30/19 1043 07/30/19 1402  HGB 8.7* 8.2*  --   --  8.4*  --   HCT 28.0* 26.4*  --   --  27.3*  --   PLT 228 187  --   --  201  --   HEPARINUNFRC  --   --  0.10* 0.27*  --  0.41  CREATININE 0.84 0.79 0.84 0.93  --   --     Estimated Creatinine Clearance: 112.7 mL/min (by C-G formula based on SCr of 0.93 mg/dL).   Medical History: Past Medical History:  Diagnosis Date  . Anxiety    takes Xanax daily  . Arthritis   . Chronic back pain    DDD  . Complication of anesthesia    agitated when awaking   . H/O rotator cuff tear   . History of kidney stones   . Hypertension    takes Toprol,Prinizide,and Amlodipine daily  . Joint pain   . Joint swelling   . Laceration    history of to right lower extermity   . Night muscle spasms    takes Soma daily  . Numbness and tingling    hands and feet   . Pneumonia    history of   . Tinnitus     Assessment: Anti-coagulation for afib held earlier this admission for surgical site bleeding, Hgb low but stable for a few days, heparin was restarted 1/1 with close monitoring of H/H and aiming for lower heparin level goal.  Heparin level is therapeutic at 0.41. Hgb stable 8.4, pltc wnl.  Goal of Therapy:  Heparin level 0.3-0.5 units/ml, recent bleeding Monitor platelets by anticoagulation protocol: Yes   Plan:  Continue heparin at 2800 units/hr Daily heparin level and CBC Monitor closely for bleeding  Vertis Kelch, PharmD, [facility] PGY2 Cardiology Pharmacy Resident Phone [phone] 07/30/2019       2:44 PM  Please check [email or web address] for unit-specific pharmacist phone numbers

## 2019-07-31 LAB — CBC
HCT: 25.3 % — ABNORMAL LOW (ref 39.0–52.0)
Hemoglobin: 7.6 g/dL — ABNORMAL LOW (ref 13.0–17.0)
MCH: 28.6 pg (ref 26.0–34.0)
MCHC: 30 g/dL (ref 30.0–36.0)
MCV: 95.1 fL (ref 80.0–100.0)
Platelets: 174 10*3/uL (ref 150–400)
RBC: 2.66 MIL/uL — ABNORMAL LOW (ref 4.22–5.81)
RDW: 15.3 % (ref 11.5–15.5)
WBC: 8 10*3/uL (ref 4.0–10.5)
nRBC: 0 % (ref 0.0–0.2)

## 2019-07-31 LAB — GLUCOSE, CAPILLARY
Glucose-Capillary: 117 mg/dL — ABNORMAL HIGH (ref 70–99)
Glucose-Capillary: 124 mg/dL — ABNORMAL HIGH (ref 70–99)
Glucose-Capillary: 128 mg/dL — ABNORMAL HIGH (ref 70–99)
Glucose-Capillary: 149 mg/dL — ABNORMAL HIGH (ref 70–99)
Glucose-Capillary: 161 mg/dL — ABNORMAL HIGH (ref 70–99)
Glucose-Capillary: 201 mg/dL — ABNORMAL HIGH (ref 70–99)

## 2019-07-31 LAB — BASIC METABOLIC PANEL
Anion gap: 10 (ref 5–15)
BUN: 21 mg/dL — ABNORMAL HIGH (ref 6–20)
CO2: 24 mmol/L (ref 22–32)
Calcium: 7.6 mg/dL — ABNORMAL LOW (ref 8.9–10.3)
Chloride: 108 mmol/L (ref 98–111)
Creatinine, Ser: 0.76 mg/dL (ref 0.61–1.24)
GFR calc Af Amer: >60 mL/min (ref >60)
GFR calc non Af Amer: >60 mL/min (ref >60)
Glucose, Bld: 214 mg/dL — ABNORMAL HIGH (ref 70–99)
Potassium: 3.3 mmol/L — ABNORMAL LOW (ref 3.5–5.1)
Sodium: 142 mmol/L (ref 135–145)

## 2019-07-31 LAB — C DIFFICILE QUICK SCREEN W PCR REFLEX
C Diff antigen: NEGATIVE
C Diff interpretation: NOT DETECTED
C Diff toxin: NEGATIVE

## 2019-07-31 LAB — HEPARIN LEVEL (UNFRACTIONATED): Heparin Unfractionated: 0.38 IU/mL (ref 0.30–0.70)

## 2019-07-31 LAB — VANCOMYCIN, RANDOM: Vancomycin Rm: 19

## 2019-07-31 LAB — VANCOMYCIN, TROUGH: Vancomycin Tr: 15 ug/mL (ref 15–20)

## 2019-07-31 MED ORDER — CHLORHEXIDINE GLUCONATE 0.12 % MT SOLN
15.0000 mL | Freq: Two times a day (BID) | OROMUCOSAL | Status: DC
  Administered 2019-07-31 – 2019-09-07 (×54): 15 mL via OROMUCOSAL
  Filled 2019-07-31 (×58): qty 15

## 2019-07-31 MED ORDER — HYDROMORPHONE HCL 1 MG/ML IJ SOLN
0.4000 mg | INTRAMUSCULAR | Status: AC
  Administered 2019-07-31 (×3): 0.4 mg via INTRAVENOUS
  Filled 2019-07-31 (×3): qty 0.5

## 2019-07-31 MED ORDER — DOCUSATE SODIUM 100 MG PO CAPS
100.0000 mg | ORAL_CAPSULE | Freq: Two times a day (BID) | ORAL | Status: DC
  Administered 2019-08-01 – 2019-08-02 (×2): 100 mg via ORAL
  Filled 2019-07-31 (×2): qty 1

## 2019-07-31 MED ORDER — ACETAMINOPHEN 325 MG PO TABS: 650.0000 mg | ORAL_TABLET | Freq: Three times a day (TID) | ORAL | Status: DC | PRN

## 2019-07-31 MED ORDER — ADULT MULTIVITAMIN W/MINERALS CH
1.0000 | ORAL_TABLET | Freq: Every day | ORAL | Status: DC
  Administered 2019-08-01 – 2019-09-07 (×38): 1 via ORAL
  Filled 2019-07-31 (×38): qty 1

## 2019-07-31 MED ORDER — THIAMINE HCL 100 MG/ML IJ SOLN
100.0000 mg | Freq: Every day | INTRAMUSCULAR | Status: DC
  Filled 2019-07-31 (×2): qty 2

## 2019-07-31 MED ORDER — POTASSIUM CHLORIDE 20 MEQ/15ML (10%) PO SOLN
20.0000 meq | ORAL | Status: AC
  Administered 2019-07-31 (×2): 20 meq
  Filled 2019-07-31 (×2): qty 15

## 2019-07-31 MED ORDER — FUROSEMIDE 10 MG/ML IJ SOLN
40.0000 mg | Freq: Two times a day (BID) | INTRAMUSCULAR | Status: DC
  Administered 2019-07-31 – 2019-08-01 (×3): 40 mg via INTRAVENOUS
  Filled 2019-07-31 (×3): qty 4

## 2019-07-31 MED ORDER — LISINOPRIL 10 MG PO TABS
10.0000 mg | ORAL_TABLET | Freq: Every day | ORAL | Status: DC
  Administered 2019-08-01 – 2019-08-19 (×19): 10 mg via ORAL
  Filled 2019-07-31 (×19): qty 1

## 2019-07-31 MED ORDER — ORAL CARE MOUTH RINSE
15.0000 mL | Freq: Two times a day (BID) | OROMUCOSAL | Status: DC
  Administered 2019-08-01 – 2019-08-27 (×21): 15 mL via OROMUCOSAL

## 2019-07-31 MED ORDER — HYDROMORPHONE HCL 2 MG PO TABS
2.0000 mg | ORAL_TABLET | ORAL | Status: DC | PRN
  Administered 2019-07-31: 2 mg via ORAL
  Filled 2019-07-31: qty 1

## 2019-07-31 MED ORDER — DIAZEPAM 5 MG PO TABS
5.0000 mg | ORAL_TABLET | Freq: Two times a day (BID) | ORAL | Status: DC
  Administered 2019-07-31 – 2019-08-08 (×17): 5 mg via ORAL
  Filled 2019-07-31 (×17): qty 1

## 2019-07-31 MED ORDER — QUETIAPINE FUMARATE 50 MG PO TABS
25.0000 mg | ORAL_TABLET | Freq: Two times a day (BID) | ORAL | Status: DC
  Administered 2019-07-31 – 2019-09-07 (×76): 25 mg via ORAL
  Filled 2019-07-31 (×76): qty 1

## 2019-07-31 MED ORDER — FOLIC ACID 1 MG PO TABS
1.0000 mg | ORAL_TABLET | Freq: Every day | ORAL | Status: DC
  Administered 2019-08-01 – 2019-09-07 (×38): 1 mg via ORAL
  Filled 2019-07-31 (×38): qty 1

## 2019-07-31 MED ORDER — THIAMINE HCL 100 MG PO TABS
100.0000 mg | ORAL_TABLET | Freq: Every day | ORAL | Status: DC
  Administered 2019-08-01 – 2019-09-07 (×38): 100 mg via ORAL
  Filled 2019-07-31 (×38): qty 1

## 2019-07-31 MED ORDER — FENTANYL CITRATE (PF) 100 MCG/2ML IJ SOLN
25.0000 ug | INTRAMUSCULAR | Status: DC | PRN
  Administered 2019-07-31 – 2019-08-01 (×4): 50 ug via INTRAVENOUS
  Filled 2019-07-31 (×4): qty 2

## 2019-07-31 MED ORDER — FENTANYL CITRATE (PF) 100 MCG/2ML IJ SOLN
25.0000 ug | INTRAMUSCULAR | Status: DC | PRN
  Administered 2019-07-31: 50 ug via INTRAVENOUS

## 2019-07-31 NOTE — Procedures (Signed)
Extubation Procedure Note  Patient Details:   Name: Vernon Frye DOB: 06/21/1961 MRN: [id number]   Airway Documentation:    Vent end date: 07/31/19 Vent end time: 1219   Evaluation  O2 sats: stable throughout Complications: No apparent complications Patient did tolerate procedure well. Bilateral Breath Sounds: Clear, Diminished   Yes, pt could speak post extubation.  Pt extubated to 3 l/m [place] per physician's order.  Earney Navy 07/31/2019, 12:20 PM

## 2019-07-31 NOTE — Progress Notes (Signed)
eLink Physician-Brief Progress Note Patient Name: Vernon Frye DOB: 1960-10-11 MRN: [id number]   Date of Service  07/31/2019  HPI/Events of Note  Ongoing severe pain.  eICU Interventions  Ordered dilaudid 2mg  PO Q4H PRN in addition to pre-existing fentanyl order for breakthrough pain.     Intervention Category Intermediate Interventions: (P) Pain - evaluation and management  Marily Lente [place] 07/31/2019, 10:51 PM

## 2019-07-31 NOTE — Progress Notes (Signed)
[facility] ADULT ICU REPLACEMENT PROTOCOL FOR AM LAB REPLACEMENT ONLY  The patient does apply for the [facility] Adult ICU Electrolyte Replacment Protocol based on the criteria listed below:   1. Is GFR >/= 40 ml/min? Yes.    Patient's GFR today is >60 2. Is urine output >/= 0.5 ml/kg/hr for the last 6 hours? Yes.   Patient's UOP is 0.9 ml/kg/hr 3. Is BUN < 60 mg/dL? Yes.    Patient's BUN today is 21 4. Abnormal electrolyte(s): K+ 3.3 5. Ordered repletion with: protocol 6. If a panic level lab has been reported, has the CCM MD in charge been notified? Yes.  .   Physician:  Dr.Sommer  Carlisle Beers 07/31/2019 5:50 AM

## 2019-07-31 NOTE — Progress Notes (Signed)
Cardiology Progress Note  Patient ID: Vernon Frye MRN: [id number] DOB: 04/28/1961 Date of Encounter: 07/31/2019  Primary Cardiologist: Sinclair Grooms, MD  Subjective  No more fever. Better mentation today. CCM thinks can be extubated. Rates better today.   ROS:  All other ROS reviewed and negative. Pertinent positives noted in the HPI.     Inpatient Medications  Scheduled Meds: . chlorhexidine gluconate (MEDLINE KIT)  15 mL Mouth Rinse BID  . Chlorhexidine Gluconate Cloth  6 each Topical Daily  . diazepam  5 mg Per Tube BID  . docusate  100 mg Per Tube BID  . feeding supplement (PRO-STAT SUGAR FREE 64)  60 mL Per Tube BID  . folic acid  1 mg Per Tube Daily  . insulin aspart  0-6 Units Subcutaneous Q4H  . lisinopril  10 mg Per Tube Daily  . mouth rinse  15 mL Mouth Rinse 10 times per day  . metoprolol tartrate  2.5 mg Intravenous Q6H  . multivitamin with minerals  1 tablet Per Tube Daily  . pantoprazole (PROTONIX) IV  40 mg Intravenous Q24H  . QUEtiapine  25 mg Per Tube BID  . sodium chloride flush  10-40 mL Intracatheter Q12H  . thiamine  100 mg Per Tube Daily   Or  . thiamine  100 mg Intravenous Daily   Continuous Infusions: . amiodarone 30 mg/hr (07/31/19 0921)  . dexmedetomidine (PRECEDEX) IV infusion 0.7 mcg/kg/hr (07/31/19 0734)  . feeding supplement (VITAL 1.5 CAL) 40 mL/hr at 07/30/19 0200  . heparin 2,800 Units/hr (07/31/19 0700)  . vancomycin Stopped (07/31/19 0307)   PRN Meds: acetaminophen, docusate, fentaNYL (SUBLIMAZE) injection, haloperidol lactate, LORazepam, menthol-cetylpyridinium **OR** phenol, ondansetron **OR** ondansetron (ZOFRAN) IV, Resource ThickenUp Clear, sodium chloride flush, sodium chloride flush   Vital Signs   Vitals:   07/31/19 0630 07/31/19 0700 07/31/19 0807 07/31/19 0917  BP: 125/77 131/84  140/81  Pulse: 86 87    Resp: 16 17    Temp:   99 F (37.2 C)   TempSrc:   Oral   SpO2: 100% 99%    Weight:      Height:         Intake/Output Summary (Last 24 hours) at 07/31/2019 1015 Last data filed at 07/31/2019 0700 Gross per 24 hour  Intake 1689.06 ml  Output 1450 ml  Net 239.06 ml   Last 3 Weights 07/31/2019 07/30/2019 07/29/2019  Weight (lbs) 231 lb 14.8 oz 227 lb 11.8 oz 226 lb 10.1 oz  Weight (kg) 105.2 kg 103.3 kg 102.8 kg      Telemetry  Overnight telemetry shows afib with rates in 90s, which I personally reviewed.    Physical Exam   Vitals:   07/31/19 0630 07/31/19 0700 07/31/19 0807 07/31/19 0917  BP: 125/77 131/84  140/81  Pulse: 86 87    Resp: 16 17    Temp:   99 F (37.2 C)   TempSrc:   Oral   SpO2: 100% 99%    Weight:      Height:         Intake/Output Summary (Last 24 hours) at 07/31/2019 1015 Last data filed at 07/31/2019 0700 Gross per 24 hour  Intake 1689.06 ml  Output 1450 ml  Net 239.06 ml    Last 3 Weights 07/31/2019 07/30/2019 07/29/2019  Weight (lbs) 231 lb 14.8 oz 227 lb 11.8 oz 226 lb 10.1 oz  Weight (kg) 105.2 kg 103.3 kg 102.8 kg    Body mass index is  28.99 kg/m.  General: intubated on vent Head: Atraumatic, normal size  Eyes: PEERLA, EOMI  Neck: Supple, no JVD Endocrine: No thryomegaly Cardiac: irregular rhythm, no m/r/g Lungs: diminished breath sounds bilaterally   Abd: Soft, nontender, no hepatomegaly  Ext: swollen LEs, L > R, clean dry incision over L knee  Musculoskeletal: No deformities, BUE and BLE strength normal and equal Skin: warm/swollen extremities  Neuro: awake follows commands   Labs  High Sensitivity Troponin:  No results for input(s): TROPONINIHS in the last 720 hours.   Cardiac EnzymesNo results for input(s): TROPONINI in the last 168 hours. No results for input(s): TROPIPOC in the last 168 hours.  Chemistry Recent Labs  Lab 07/28/19 0959 07/29/19 1700 07/30/19 0506 07/31/19 0437  NA 144 142 139 142  K 3.6 3.3* 3.3* 3.3*  CL 114* 107 105 108  CO2 '22 25 22 24  ' GLUCOSE 166* 146* 200* 214*  BUN 18 17 21* 21*  CREATININE 0.84 0.84 0.93 0.76   CALCIUM 8.0* 7.5* 7.4* 7.6*  PROT 5.8*  --   --   --   ALBUMIN 1.7*  --   --   --   AST 54*  --   --   --   ALT 76*  --   --   --   ALKPHOS 109  --   --   --   BILITOT 0.9  --   --   --   GFRNONAA >60 >60 >60 >60  GFRAA >60 >60 >60 >60  ANIONGAP '8 10 12 10    ' Hematology Recent Labs  Lab 07/29/19 0243 07/30/19 1043 07/31/19 0437  WBC 7.3 9.9 8.0  RBC 2.83* 2.92* 2.66*  HGB 8.2* 8.4* 7.6*  HCT 26.4* 27.3* 25.3*  MCV 93.3 93.5 95.1  MCH 29.0 28.8 28.6  MCHC 31.1 30.8 30.0  RDW 15.9* 15.4 15.3  PLT 187 201 174   BNPNo results for input(s): BNP, PROBNP in the last 168 hours.  DDimer No results for input(s): DDIMER in the last 168 hours.   Radiology  DG CHEST PORT 1 VIEW  Result Date: 07/29/2019 CLINICAL DATA:  Check PICC line placement EXAM: PORTABLE CHEST 1 VIEW COMPARISON:  Film from earlier in the same day. FINDINGS: Endotracheal tube and gastric catheter are again noted and stable. Cardiac shadow is unchanged. Mild left basilar atelectasis and effusion is noted. The right-sided PICC line is again identified and the tip has been uncoiled and now lies at the cavoatrial junction. IMPRESSION: PICC line is been repositioned and now lies at the cavoatrial junction. The remainder of the exam is stable from prior study. Electronically Signed   By: Inez Catalina M.D.   On: 07/29/2019 17:37   DG CHEST PORT 1 VIEW  Result Date: 07/29/2019 CLINICAL DATA:  PICC line not working. EXAM: PORTABLE CHEST 1 VIEW COMPARISON:  Earlier today at 5:50 a.m. FINDINGS: 4:25 p.m. Right-sided PICC line is abnormally positioned, likely with its tip in the azygos vein. Endotracheal tube terminates 5.1 cm above carina. Nasogastric tube extends beyond the inferior aspect of the film. Normal heart size. Decrease to resolution of left pleural effusion. No pneumothorax. No congestive failure. Persistent left base atelectasis. IMPRESSION: New right PICC line, abnormally positioned, with tip likely in the azygos vein.  Recommend repositioning. Improved to resolved left pleural effusion with persistent left base atelectasis. These results will be called to the ordering clinician or representative by the Radiologist Assistant, and communication documented in the PACS or zVision Dashboard. Electronically  Signed   By: Abigail Miyamoto M.D.   On: 07/29/2019 16:46   VAS [place] LOWER EXTREMITY VENOUS (DVT)  Result Date: 07/29/2019  Lower Venous Study Indications: Edema, and septic knee joint.  Limitations: Body habitus, poor ultrasound/tissue interface, bandages, orthopaedic appliance and vented. Comparison Study: No prior study. Performing Technologist: Maudry Mayhew MHA, RDMS, RVT, RDCS  Examination Guidelines: A complete evaluation includes B-mode imaging, spectral Doppler, color Doppler, and power Doppler as needed of all accessible portions of each vessel. Bilateral testing is considered an integral part of a complete examination. Limited examinations for reoccurring indications may be performed as noted.  +-----+---------------+---------+-----------+----------+--------------+ RIGHTCompressibilityPhasicitySpontaneityPropertiesThrombus Aging +-----+---------------+---------+-----------+----------+--------------+ CFV                                               Not visualized +-----+---------------+---------+-----------+----------+--------------+   +---------+---------------+---------+-----------+----------+--------------+ LEFT     CompressibilityPhasicitySpontaneityPropertiesThrombus Aging +---------+---------------+---------+-----------+----------+--------------+ CFV      Full           Yes      Yes                                 +---------+---------------+---------+-----------+----------+--------------+ SFJ      Full                                                        +---------+---------------+---------+-----------+----------+--------------+ FV Prox  Full                                                         +---------+---------------+---------+-----------+----------+--------------+ FV Mid   Full                                                        +---------+---------------+---------+-----------+----------+--------------+ FV DistalFull                                                        +---------+---------------+---------+-----------+----------+--------------+ PFV      Full                                                        +---------+---------------+---------+-----------+----------+--------------+ POP                                                   Not visualized +---------+---------------+---------+-----------+----------+--------------+ PTV      Full  Yes                                 +---------+---------------+---------+-----------+----------+--------------+ PERO     Full                    Yes                                 +---------+---------------+---------+-----------+----------+--------------+ Limited visualization of left calf veins.    Summary: Left: There is no evidence of deep vein thrombosis in the lower extremity. However, portions of this examination were limited- see technologist comments above.  *See table(s) above for measurements and observations. Electronically signed by Deitra Mayo MD on 07/29/2019 at 5:24:29 PM.    Final    VAS [place] UPPER EXTREMITY VENOUS DUPLEX  Result Date: 07/29/2019 UPPER VENOUS STUDY  Indications: Edema, and septic left knee joint. Limitations: Body habitus, poor ultrasound/tissue interface and sedated. Comparison Study: Left upper extremity venous duplex 07/21/2019- negative for                   DVT, superficial vein thrombosis left cephalic vein. Performing Technologist: Maudry Mayhew MHA, RDMS, RVT, RDCS  Examination Guidelines: A complete evaluation includes B-mode imaging, spectral Doppler, color Doppler, and power Doppler as needed of all accessible portions  of each vessel. Bilateral testing is considered an integral part of a complete examination. Limited examinations for reoccurring indications may be performed as noted.  Right Findings: +----------+------------+---------+-----------+----------+--------------+ RIGHT     CompressiblePhasicitySpontaneousProperties   Summary     +----------+------------+---------+-----------+----------+--------------+ Subclavian                                          Not visualized +----------+------------+---------+-----------+----------+--------------+  Left Findings: +----------+------------+---------+-----------+----------+-------+ LEFT      CompressiblePhasicitySpontaneousPropertiesSummary +----------+------------+---------+-----------+----------+-------+ IJV                      Yes       Yes                      +----------+------------+---------+-----------+----------+-------+ Subclavian    Full       Yes       Yes                      +----------+------------+---------+-----------+----------+-------+ Axillary      Full       Yes       Yes                      +----------+------------+---------+-----------+----------+-------+ Brachial      Full       Yes       Yes                      +----------+------------+---------+-----------+----------+-------+ Radial        Full                                          +----------+------------+---------+-----------+----------+-------+ Ulnar         Full                                          +----------+------------+---------+-----------+----------+-------+  Cephalic      None                                   Acute  +----------+------------+---------+-----------+----------+-------+ Basilic       Full                                          +----------+------------+---------+-----------+----------+-------+  Summary:  Left: No evidence of deep vein thrombosis in the upper extremity. Findings consistent with  acute superficial vein thrombosis involving the left cephalic vein. No significant change when compared to prior study.  *See table(s) above for measurements and observations.  Diagnosing physician: Deitra Mayo MD Electronically signed by Deitra Mayo MD on 07/29/2019 at 5:24:39 PM.    Final     Cardiac Studies  TTE 07/13/2019  1. Left ventricular ejection fraction, by visual estimation, is 55 to 60%. The left ventricle has normal function. There is mildly increased left ventricular hypertrophy.  2. Left ventricular diastolic function could not be evaluated.  3. Right ventricular volume/pressure overload.  4. The left ventricle demonstrates regional wall motion abnormalities.  5. Global right ventricle has severely reduced systolic function.The right ventricular size is severely enlarged. No increase in right ventricular wall thickness.  6. Left atrial size was normal.  7. Right atrial size was mild-moderately dilated.  8. Presence of pericardial fat pad.  9. Trivial pericardial effusion is present. 10. Mild mitral annular calcification. 11. The mitral valve is degenerative. Trivial mitral valve regurgitation. 12. The tricuspid valve is grossly normal. Tricuspid valve regurgitation is trivial. 13. The aortic valve is tricuspid. Aortic valve regurgitation is not visualized. No evidence of aortic valve sclerosis or stenosis. 14. The pulmonic valve was grossly normal. Pulmonic valve regurgitation is not visualized. 15. TR signal is inadequate for assessing pulmonary artery systolic pressure. 16. The inferior vena cava is normal in size with greater than 50% respiratory variability, suggesting right atrial pressure of 3 mmHg. 17. No prior Echocardiogram.   Patient Profile  Vernon Frye is a 59 y.o. male with admitted with sepsis 12/16, infected L knee (prosthetic). Course complicated by MRSA bacteremia and Afib with RVR. S/p removal of hardware and antibiotic spacer.    Assessment & Plan   1. Afib with RVR: secondary to sepsis. -rates much better and not as agitated -no more fever -suspect drive by infection, sepsis, and agitation -continue amiodarone for now -continue heparin drip -can transition to orals once extubated  2. Fever, MRSA bacteremia -fever yesterday -consider TEE to exclude endocarditis -no murmurs but if persistently febrile, would consider this  For questions or updates, please contact [place] Please consult [email or web address] for contact info under   Signed, [place] T. Audie Box, [place]  07/31/2019 10:15 AM

## 2019-07-31 NOTE — Progress Notes (Signed)
CRITICAL VALUE ALERT  Critical Value:  Potassium 3.3  Date & Time Notied:  07/31/19 0538  Provider Notified: elink  Orders Received/Actions taken: awaiting orders

## 2019-07-31 NOTE — Progress Notes (Signed)
eLink Physician-Brief Progress Note Patient Name: Vernon Frye DOB: 07-Sep-1960 MRN: [id number]   Date of Service  07/31/2019  HPI/Events of Note  Pt admitted for sepsis secondary to septic left knee who has Afib with RVR in setting of 9/10 pain and sepsis. Patient very hoarse after recent extubation - would be preferable to minimize PO pill burden.  Of note, currently in AF w/ RVR ~140 bpm though hemodynamically tolerated very well.  eICU Interventions  Ordered fentanyl 25-59mcg IV Q2H PRN for severe pain, as well as APAP 650mg  Q8H PRN mild/moderate pain.  If HR (AF w/ RVR) not improved after administration of pain medication as above, asked RN to call back to discuss addition of further rate control agents. On 2.5mg  IV metop Q6H currently.     Intervention Category Major Interventions: Arrhythmia - evaluation and management Intermediate Interventions: Pain - evaluation and management  Charlott Rakes 07/31/2019, 8:43 PM

## 2019-07-31 NOTE — Progress Notes (Signed)
NAME:  Vernon Frye, MRN:  [id number], DOB:  1960/12/02, LOS: 27 ADMISSION DATE:  07/12/2019, CONSULTATION DATE:  07/13/19 REFERRING MD:  [place] - [facility], CHIEF COMPLAINT:  L knee pain  Brief History   59 yo M PMH Afib, HTN, L TKR (2016) who presented to ED with CC L knee pain. 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   Underwent left knee revision and replacement with placement of antibiotic spacer 12/20, patient remained intubated post procedure.  Overnight of 01/01 patient seen with spike in temperature of 102.6, tachycardia, and hypertension with development of very loose mucousy stools.  Given extended use of IV antibiotics concern for C. difficile.    01/03: No acute events overnight, C.diff negative.    Past Medical History  A fib HTN Anxiety L TKR  L shoulder arthroscopy  DDD  Significant Hospital Events   12/16 presents to ED for L knee pain. Concern for septic joint. A fiv RVR. Developed SOB after vanc administration. PCCM consulted for admission Underwent left knee Irrigation and debridement, total knee with poly exchange     12/29: L knee revision and replacement, remains intubated post operatively   01/02: Overnight of 01/01 patient seen with spike in temperature of 102.6, tachycardia, and hypertension with development of very loose mucousy stools.  Given extended use of IV antibiotics concern for C. difficile.    Consults:  Ortho  Cardiology ID EP  Procedures:  ETT 12/29 >>   Significant Diagnostic Tests:  12/19 ECHO > LVEF 55-60%  12/15 L knee XR > prior L knee replacement. Moderate joint effusion, no body abnormality   12/16 Renal ultrasound > Mild left-sided hydronephrosis is noted.  12/21 MRI lumbar spine DJD, no neural impingement, no discitis  12/24 UE venous: Right: No evidence of thrombosis in the subclavian. Left:No evidence of deep vein thrombosis in the upper extremity.  Findings consistent with acute superficial vein thrombosis involving the left cephalic vein.  12/27 cth: No acute intracranial abnormality. Non contrast CT appearance of the brain within normal limits for age. 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  Micro Data:  12/16 SARS Cov2 > negative  12/16 BCx > positive Staph MRSA  12/16 Strep A > Negative  12/16 Flu A/B > neg 12/16 MRSA PCR > positive  12/16 Body culture left knee > Abundant gram positive cocci  12/18 blood: negative 01/02: C. Diff > negative  01/02 blood culture: no growth <24hrs  01/02 urine culture > Gram negative rods   Antimicrobials:  12/16 vanc x1 12/16 ceftriaxone x1 12/16 Zosyn > 12/16 Daptomycin 12/17 >12/21 vanc 12/22 > 6 weeks planned thru 09/06/2019  Interim history/subjective:  No acute overnight events reported. Patient continues with improved mental status and able to follow commands.   Objective   Blood pressure (!) 144/92, pulse (!) 113, temperature 99 F (37.2 C), temperature source Oral, resp. rate (!) 28, height 6\' 3"  (1.905 m), weight 105.2 kg, SpO2 100 %.    Vent Mode: PSV;CPAP FiO2 (%):  [30 %] 30 % Set Rate:  [16 bmp] 16 bmp Vt Set:  [670 mL] 670 mL PEEP:  [5 cmH20] 5 cmH20 Pressure Support:  [10 cmH20] 10 cmH20 Plateau Pressure:  [16 cmH20-20 cmH20] 16 cmH20   Intake/Output Summary (Last 24 hours) at 07/31/2019 1109 Last data filed at 07/31/2019 1100 Gross per 24 hour  Intake 1878.4 ml  Output 1450 ml  Net  428.4 ml   Filed Weights   07/29/19 0334 07/30/19 0500 07/31/19 0500  Weight: 102.8 kg 103.3 kg 105.2 kg    Examination: General: Chronically ill appearing male on mechanical ventilation, in NAD HEENT: ETT, MM pink/moist, PERRL,  Neuro: Alert and able to follow commands, no focal deficits on examination CV: s12 regular rate and rhythm, no murmur, rubs, or gallops,  PULM: Clear to auscultation bilaterally without any additional breath sounds;  on SBT and tolerating well  GI: soft, non-distended, non-tender to palpation, bowel sounds active in all 4 quadrants,  Extremities: warm/dry, generalized edema    Resolved Hospital Problem list   Acute kidney injury  Assessment & Plan:  Post operative resp insuff:  -reportedly 2/2 mental status P: Continues to tolerate wean, likely can extubate today Wean PEEP and FiO2 for sats greater than 90%. Head of bed elevated 30 degrees. Plateau pressures less than 30 cm H20.  Follow post extubation CXR  Ensure adequate pulmonary hygiene  VAP bundle in place  PAD protocol  Acute encephalopathy secondary to agitated delirium - multifactorial could be from polysubstance abuse or etoh withdrawal. +/- sepsis P Mental status improving - following commands and communicating via writing  Continue thiamine, folate, multivitamin Patient remains on precedex  Delirium precautions  MRSA Bacteremia  -resolved bacteremia, on 6 weeks vanc per ID -repeat cx negative 12/18, BCx 01/02 with no growth <24 hrs.  -MRI negative for spinal abscess or osteomyelitis.  -TTE 12/16 w/ EF 55-60% with LV regional wall motion abnormalities, global RV reduced systolic functon, degenerative mitral valve, trivial mitral and tricuspid valve regurgitation; no evidence of aortiv calve sclerosis or stenosis -TEE previously refused TEE; per ID, would not change management much. However, recommended by cardiology to exclude endocarditis. TEE ordered.  - Continue IV vancomycin per ID   Gram negative rod urine cx:  - Pt is currently afebrile and hemodynamically stable, asymptomatic  P: Continue to monitor   Afib RVR -Non-adherent w eliquis outpatient (2/2 financial constraints) -Currently rate controlled with amiodarone gtt  P  Cardiology following, appreciate assistance Continue amiodarone drip As needed beta-blockers IV heparin  Generalized edema:  Likely secondary to critical illness and fluids. Continues to have  generalized edema.  P - Received lasix yesterday with 1.2L UOP overnight - Lasix 40mg  bid  - Strict I/O   Left Knee Septic joint -suspect septic joint in setting of L TKR in 2016 -Underwent I&D with partial hardwear exchange 12/16 -Total knee revision with removal of left total knee replacement and insertion of antibiotic spacers 12/29 P Management per Ortho PT/OT once able  Hx EtOH and substance use -Hx c/w prior heavy use -denies substance use; UDS pos for Amphetamines, BZD, Opioids (Home opioids for pain, BZD for anxiety) P Precedex drip and as needed fentanyl as above Continue thiamine, folate, and multivitamin Scheduled Valium and Seroquel As needed Haldol   Prediabetes -HbA1c 6.3 P: - CBG q4h - SSI  Normocytic anemia:  P:  Stable, anticoagulation resumed 1/1  Best practice:  Diet: advance diet as tolerated  Pain/Anxiety/Delirium protocol (if indicated): per protocol VAP protocol (if indicated): per protocol DVT prophylaxis: SCD's  GI prophylaxis: PPI  Glucose control: monitor Mobility: bed rest Code Status: Full  Family Communication: will update family  Disposition: ICU  Labs   CBC: Recent Labs  Lab 07/28/19 0522 07/28/19 0959 07/29/19 0243 07/30/19 1043 07/31/19 0437  WBC 8.2 8.2 7.3 9.9 8.0  HGB 8.3* 8.7* 8.2* 8.4* 7.6*  HCT 26.3* 28.0* 26.4*  27.3* 25.3*  MCV 92.9 92.4 93.3 93.5 95.1  PLT 238 228 187 201 [id number]    Basic Metabolic Panel: Recent Labs  Lab 07/25/19 0653 07/26/19 0404 07/26/19 1719 07/28/19 0522 07/28/19 0959 07/29/19 0243 07/29/19 1700 07/30/19 0506 07/31/19 0437  NA 145 144  --  142 144 143 142 139 142  K 3.7 3.3*  --  3.3* 3.6 3.6 3.3* 3.3* 3.3*  CL 112* 113*   < > 110 114* 114* 107 105 108  CO2 20* 22   < > 23 22 21* 25 22 24   GLUCOSE 144* 144*   < > 194* 166* 191* 146* 200* 214*  BUN 13 15   < > 18 18 18 17  21* 21*  CREATININE 0.73 0.70   < > 0.82 0.84 0.79 0.84 0.93 0.76  CALCIUM 7.8* 7.9*   < > 7.9* 8.0* 7.7*  7.5* 7.4* 7.6*  MG 1.6* 1.6*   < > 1.7 1.7 1.7 1.6* 1.5*  --   PHOS 2.4* 2.8  --  3.1 3.1 2.7  --   --   --    < > = values in this interval not displayed.   GFR: Estimated Creatinine Clearance: 132.1 mL/min (by C-G formula based on SCr of 0.76 mg/dL). Recent Labs  Lab 07/28/19 0959 07/29/19 0243 07/30/19 1043 07/31/19 0437  WBC 8.2 7.3 9.9 8.0    Liver Function Tests: Recent Labs  Lab 07/28/19 0959  AST 54*  ALT 76*  ALKPHOS 109  BILITOT 0.9  PROT 5.8*  ALBUMIN 1.7*   No results for input(s): LIPASE, AMYLASE in the last 168 hours. No results for input(s): AMMONIA in the last 168 hours.  ABG    Component Value Date/Time   PHART 7.404 07/26/2019 1719   PCO2ART 33.8 07/26/2019 1719   PO2ART 180.0 (H) 07/26/2019 1719   HCO3 21.2 07/26/2019 1719   TCO2 22 07/26/2019 1719   ACIDBASEDEF 3.0 (H) 07/26/2019 1719   O2SAT 100.0 07/26/2019 1719     Coagulation Profile: No results for input(s): INR, PROTIME in the last 168 hours.  Cardiac Enzymes: No results for input(s): CKTOTAL, CKMB, CKMBINDEX, TROPONINI in the last 168 hours.  HbA1C: Hgb A1c MFr Bld  Date/Time Value Ref Range Status  07/27/2019 03:56 AM 5.2 4.8 - 5.6 % Final    Comment:    (NOTE) Pre diabetes:          5.7%-6.4% Diabetes:              >6.4% Glycemic control for   <7.0% adults with diabetes   07/13/2019 04:08 PM 6.3 (H) 4.8 - 5.6 % Final    Comment:    (NOTE) Pre diabetes:          5.7%-6.4% Diabetes:              >6.4% Glycemic control for   <7.0% adults with diabetes     CBG: Recent Labs  Lab 07/30/19 2010 07/30/19 2224 07/30/19 2331 07/31/19 0433 07/31/19 0747  GLUCAP 170* 167* 186* 201* 161*     Critical care time:   CRITICAL CARE Performed by: Harvie Heck Internal Medicine, PGY-1 07/30/2018 11:35 AM  Total critical care time: 40 minutes  Critical care time was exclusive of separately billable procedures and treating other patients.  Critical care was necessary to  treat or prevent imminent or life-threatening deterioration.  Critical care was time spent personally by me on the following activities: development of treatment plan with patient and/or surrogate as  well as nursing, discussions with consultants, evaluation of patient's response to treatment, examination of patient, obtaining history from patient or surrogate, ordering and performing treatments and interventions, ordering and review of laboratory studies, ordering and review of radiographic studies, pulse oximetry and re-evaluation of patient's condition.

## 2019-07-31 NOTE — Progress Notes (Addendum)
Pharmacy Antibiotic Note  Vernon Frye is a 59 y.o. male admitted on 07/12/2019 with MRSA bacteremia and prosthetic joint infection. Pharmacy has been consulted for vancomycin dosing. Pt on extended infusion time (4hrs) to alleviate Red Man Syndrome; no adverse effects reported since this was begun. Vanc random today 19 (drawn late), trough 15 >> AUC 455, at goal. SCr stable at 0.76.  Plan: Continue vancomycin 1250mg  IV q12h  Monitor clinical progress, c/s, renal function F/u de-escalation plan/LOT, vancomycin levels at least weekly ID planning vancomycin continuing through 09/06/19   Height: 6\' 3"  (190.5 cm) Weight: 231 lb 14.8 oz (105.2 kg) IBW/kg (Calculated) : 84.5  Temp (24hrs), Avg:99.3 F (37.4 C), Min:99 F (37.2 C), Max:100.5 F (38.1 C)  Recent Labs  Lab 07/28/19 0522 07/28/19 0959 07/28/19 1412 07/29/19 0243 07/29/19 1700 07/30/19 0506 07/30/19 1043 07/31/19 0437 07/31/19 1044 07/31/19 1428  WBC 8.2 8.2  --  7.3  --   --  9.9 8.0  --   --   CREATININE 0.82 0.84  --  0.79 0.84 0.93  --  0.76  --   --   VANCOTROUGH  --   --  13*  --   --   --   --   --   --  15  VANCOPEAK 45* 20*  --   --   --   --   --   --   --   --   VANCORANDOM  --   --   --   --   --   --   --   --  19  --     Estimated Creatinine Clearance: 132.1 mL/min (by C-G formula based on SCr of 0.76 mg/dL).    Allergies  Allergen Reactions  . Vancomycin Shortness Of Breath and Rash    [name]    Antimicrobials: Vanc 12/22>>(09/06/19) **Vanc 12/16 x1 - only received 1/2 dose d/t red man's syndrome, MD said d/c, then resume vanc Ceftriaxone 12/16 x1 Zosyn 12/16x1 Dapto 12/16>>12/22 - CK qWed - 126 (BL, 12/17)  12/25: VT 11, AUC 356, switched to vanc IV 1500 Q12 (over 4h d/t Red man, AUC 530) 12/28 VP = 28; trough 17 (giving over 4 hrs- but can use for kinetics)- AUC on 1500 q 12 = 591 - reduce to 1250 q 12 for AUC 492 12/31 VP 20, VT 13 - AUC 456 (giving over 4hrs) - cont 1250  q12h 1/3 VR 19, VT 15 - AUC 455 (giving over 4 hrs) - cont 1250 q12h  Microbiology: 12/16 BCx 2/2 mrsa 12/16L knee fluid: abundant MRSA 12/16 Ucx: neg 12/16 Grp A strep - neg 12/17 BCx >>1/2 SA 12/18 BCx >> ngF 1/2 Cdiff >> neg 1/2 UCx >>  >100k GNR 1/2 BCx >> ngtd  Vertis Kelch, PharmD, [facility] PGY2 Cardiology Pharmacy Resident Phone [phone] 07/31/2019       3:38 PM  Please check [email or web address] for unit-specific pharmacist phone numbers

## 2019-07-31 NOTE — Progress Notes (Signed)
[facility] for Heparin  Indication: atrial fibrillation  Allergies  Allergen Reactions  . Vancomycin Shortness Of Breath and Rash    [name]     Patient Measurements: Height: 6\' 3"  (190.5 cm) Weight: 231 lb 14.8 oz (105.2 kg) IBW/kg (Calculated) : 84.5  Vital Signs: Temp: 99 F (37.2 C) (01/03 0400) Temp Source: Oral (01/03 0400) BP: 131/84 (01/03 0700) Pulse Rate: 87 (01/03 0700)  Labs: Recent Labs    07/29/19 0243 07/29/19 0243 07/29/19 1700 07/30/19 0506 07/30/19 1043 07/30/19 1402 07/31/19 0437  HGB 8.2*  --   --   --  8.4*  --  7.6*  HCT 26.4*  --   --   --  27.3*  --  25.3*  PLT 187  --   --   --  201  --  174  HEPARINUNFRC  --    < > 0.10* 0.27*  --  0.41 0.38  CREATININE 0.79  --  0.84 0.93  --   --  0.76   < > = values in this interval not displayed.    Estimated Creatinine Clearance: 132.1 mL/min (by C-G formula based on SCr of 0.76 mg/dL).   Medical History: Past Medical History:  Diagnosis Date  . Anxiety    takes Xanax daily  . Arthritis   . Chronic back pain    DDD  . Complication of anesthesia    agitated when awaking   . H/O rotator cuff tear   . History of kidney stones   . Hypertension    takes Toprol,Prinizide,and Amlodipine daily  . Joint pain   . Joint swelling   . Laceration    history of to right lower extermity   . Night muscle spasms    takes Soma daily  . Numbness and tingling    hands and feet   . Pneumonia    history of   . Tinnitus     Assessment: Anti-coagulation for afib held earlier this admission for surgical site bleeding, Hgb low but stable for a few days, heparin was restarted 1/1 with close monitoring of H/H and aiming for lower heparin level goal.  Heparin level is therapeutic at 0.38. Hgb down 7.6, pltc wnl. No overt bleeding noted per RN.  Goal of Therapy:  Heparin level 0.3-0.5 units/ml, recent bleeding Monitor platelets by anticoagulation protocol: Yes   Plan:  Continue heparin at 2800 units/hr Daily heparin level and CBC Monitor closely for bleeding  Vertis Kelch, PharmD, [facility] PGY2 Cardiology Pharmacy Resident Phone [phone] 07/31/2019       7:23 AM  Please check [email or web address] for unit-specific pharmacist phone numbers

## 2019-07-31 NOTE — Progress Notes (Signed)
Patient ID: Vernon Frye, male   DOB: 1961/04/18, 59 y.o.   MRN: [id number] Subjective: 5 Days Post-Op Procedure(s) (LRB): TOTAL KNEE REVISION removal of left total knee replacement with placement of antibiotic spacers (Left)    Patient intubated but desperately trying to commubicate.  Seems relatively calm at this point  Objective:   VITALS:   Vitals:   07/31/19 0630 07/31/19 0700  BP: 125/77 131/84  Pulse: 86 87  Resp: 16 17  Temp:    SpO2: 100% 99%   EXAM: BLE edema LEFT KNEE - dressing c/d/i Notable prepatellar swelling versus intra-articular effusion  LABS Recent Labs    07/29/19 0243 07/30/19 1043 07/31/19 0437  HGB 8.2* 8.4* 7.6*  HCT 26.4* 27.3* 25.3*  WBC 7.3 9.9 8.0  PLT 187 201 174    Recent Labs    07/29/19 1700 07/30/19 0506 07/31/19 0437  NA 142 139 142  K 3.3* 3.3* 3.3*  BUN 17 21* 21*  CREATININE 0.84 0.93 0.76  GLUCOSE 146* 200* 214*    No results for input(s): LABPT, INR in the last 72 hours.   Assessment/Plan: 5 Days Post-Op Procedure(s) (LRB): TOTAL KNEE REVISION removal of left total knee replacement with placement of antibiotic spacers (Left)  PLAN: Extubation protocol per CCM As for left knee - continue IV antibiotics as per ID PWB LLE No ROM with PT When OOB will need knee immobilizer on with padding behind calf and thigh Ice to knee for pain

## 2019-08-01 LAB — BASIC METABOLIC PANEL
Anion gap: 9 (ref 5–15)
BUN: 20 mg/dL (ref 6–20)
CO2: 25 mmol/L (ref 22–32)
Calcium: 7.8 mg/dL — ABNORMAL LOW (ref 8.9–10.3)
Chloride: 106 mmol/L (ref 98–111)
Creatinine, Ser: 0.75 mg/dL (ref 0.61–1.24)
GFR calc Af Amer: >60 mL/min (ref >60)
GFR calc non Af Amer: >60 mL/min (ref >60)
Glucose, Bld: 154 mg/dL — ABNORMAL HIGH (ref 70–99)
Potassium: 3.4 mmol/L — ABNORMAL LOW (ref 3.5–5.1)
Sodium: 140 mmol/L (ref 135–145)

## 2019-08-01 LAB — CBC
HCT: 28.2 % — ABNORMAL LOW (ref 39.0–52.0)
Hemoglobin: 8.7 g/dL — ABNORMAL LOW (ref 13.0–17.0)
MCH: 28.8 pg (ref 26.0–34.0)
MCHC: 30.9 g/dL (ref 30.0–36.0)
MCV: 93.4 fL (ref 80.0–100.0)
Platelets: 207 10*3/uL (ref 150–400)
RBC: 3.02 MIL/uL — ABNORMAL LOW (ref 4.22–5.81)
RDW: 15.1 % (ref 11.5–15.5)
WBC: 10.1 10*3/uL (ref 4.0–10.5)
nRBC: 0 % (ref 0.0–0.2)

## 2019-08-01 LAB — HEPARIN LEVEL (UNFRACTIONATED): Heparin Unfractionated: 0.32 IU/mL (ref 0.30–0.70)

## 2019-08-01 LAB — URINE CULTURE: Culture: >=100000 — AB

## 2019-08-01 LAB — GLUCOSE, CAPILLARY
Glucose-Capillary: 124 mg/dL — ABNORMAL HIGH (ref 70–99)
Glucose-Capillary: 121 mg/dL — ABNORMAL HIGH (ref 70–99)
Glucose-Capillary: 138 mg/dL — ABNORMAL HIGH (ref 70–99)
Glucose-Capillary: 141 mg/dL — ABNORMAL HIGH (ref 70–99)

## 2019-08-01 LAB — PROTIME-INR
INR: 1.3 — ABNORMAL HIGH (ref 0.8–1.2)
Prothrombin Time: 15.6 seconds — ABNORMAL HIGH (ref 11.4–15.2)

## 2019-08-01 MED ORDER — METOPROLOL TARTRATE 12.5 MG HALF TABLET
12.5000 mg | ORAL_TABLET | Freq: Four times a day (QID) | ORAL | Status: DC
  Administered 2019-08-01 (×2): 12.5 mg via ORAL
  Filled 2019-08-01 (×2): qty 1

## 2019-08-01 MED ORDER — METOPROLOL TARTRATE 25 MG PO TABS: 25.0000 mg | ORAL_TABLET | Freq: Four times a day (QID) | ORAL | Status: DC

## 2019-08-01 MED ORDER — POTASSIUM CHLORIDE 20 MEQ/15ML (10%) PO SOLN
40.0000 meq | Freq: Four times a day (QID) | ORAL | Status: AC
  Administered 2019-08-01 (×2): 40 meq via ORAL
  Filled 2019-08-01 (×2): qty 30

## 2019-08-01 MED ORDER — GABAPENTIN 300 MG PO CAPS
300.0000 mg | ORAL_CAPSULE | Freq: Three times a day (TID) | ORAL | Status: DC
  Administered 2019-08-01 – 2019-09-07 (×112): 300 mg via ORAL
  Filled 2019-08-01 (×112): qty 1

## 2019-08-01 MED ORDER — KETOROLAC TROMETHAMINE 30 MG/ML IJ SOLN
30.0000 mg | Freq: Four times a day (QID) | INTRAMUSCULAR | Status: DC
  Administered 2019-08-01 – 2019-08-03 (×8): 30 mg via INTRAVENOUS
  Filled 2019-08-01 (×9): qty 1

## 2019-08-01 MED ORDER — ACETAMINOPHEN 325 MG PO TABS
650.0000 mg | ORAL_TABLET | Freq: Four times a day (QID) | ORAL | Status: DC
  Administered 2019-08-01 – 2019-09-07 (×139): 650 mg via ORAL
  Filled 2019-08-01 (×141): qty 2

## 2019-08-01 MED ORDER — HYDROMORPHONE HCL 2 MG PO TABS
4.0000 mg | ORAL_TABLET | ORAL | Status: DC | PRN
  Administered 2019-08-01: 05:00:00 4 mg via ORAL
  Filled 2019-08-01: qty 2

## 2019-08-01 MED ORDER — VANCOMYCIN IV (FOR PTA / DISCHARGE USE ONLY): 1250.0000 mg | Freq: Two times a day (BID) | INTRAVENOUS | 0 refills | Status: DC

## 2019-08-01 MED ORDER — OXYCODONE HCL 5 MG PO TABS
10.0000 mg | ORAL_TABLET | ORAL | Status: DC | PRN
  Administered 2019-08-01 – 2019-08-07 (×24): 10 mg via ORAL
  Filled 2019-08-01 (×25): qty 2

## 2019-08-01 MED ORDER — POTASSIUM CHLORIDE CRYS ER 20 MEQ PO TBCR
20.0000 meq | EXTENDED_RELEASE_TABLET | ORAL | Status: DC
  Administered 2019-08-01: 20 meq via ORAL
  Filled 2019-08-01: qty 1

## 2019-08-01 MED ORDER — MAGNESIUM SULFATE 4 GM/100ML IV SOLN
4.0000 g | Freq: Once | INTRAVENOUS | Status: AC
  Administered 2019-08-01: 4 g via INTRAVENOUS
  Filled 2019-08-01: qty 100

## 2019-08-01 MED ORDER — METOPROLOL TARTRATE 50 MG PO TABS
50.0000 mg | ORAL_TABLET | Freq: Four times a day (QID) | ORAL | Status: DC
  Administered 2019-08-01 – 2019-08-09 (×31): 50 mg via ORAL
  Filled 2019-08-01 (×31): qty 1

## 2019-08-01 MED ORDER — MORPHINE SULFATE ER 15 MG PO TBCR
15.0000 mg | EXTENDED_RELEASE_TABLET | Freq: Two times a day (BID) | ORAL | Status: DC
  Administered 2019-08-01 – 2019-08-06 (×11): 15 mg via ORAL
  Filled 2019-08-01 (×11): qty 1

## 2019-08-01 MED ORDER — HYDROMORPHONE HCL 1 MG/ML IJ SOLN
3.0000 mg | INTRAMUSCULAR | Status: DC | PRN
  Administered 2019-08-01 – 2019-08-05 (×14): 3 mg via INTRAVENOUS
  Filled 2019-08-01 (×14): qty 3

## 2019-08-01 MED ORDER — SODIUM CHLORIDE 0.9 % IV SOLN: INTRAVENOUS | Status: DC

## 2019-08-01 NOTE — Progress Notes (Signed)
    [facility] has been requested to perform a transesophageal echocardiogram on Vernon Frye for Atrial fibrillation and bacteremia.  After careful review of history and examination, the risks and benefits of transesophageal echocardiogram have been explained including risks of esophageal damage, perforation (1:10,000 risk), bleeding, pharyngeal hematoma as well as other potential complications associated with conscious sedation including aspiration, arrhythmia, respiratory failure and death. Alternatives to treatment were discussed, questions were answered. Patient is willing to proceed.   Ceceilia Cephus Ninfa Meeker, PA-C  08/01/2019 1:05 PM

## 2019-08-01 NOTE — Progress Notes (Signed)
Physical Therapy Treatment Patient Details Name: Vernon Frye MRN: [id number] DOB: [birth year] Today's Date: 08/01/2019    History of Present Illness 59 yo M PMH Afib, HTN, L TKR (2016) who presented to ED with CC L knee pain. Pain began 12/14 and has progressed prompting presentation. 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 Desaturated with hypoxemic respiratory failure requiring 3LNC. Pt underwent L TKR with placement of antibiotic spacers on 12/29, agitated after procedure and unable to be extubated. Extubated 07/31/19.    PT Comments    Patient progressing well towards PT goals. Requires Max A of 2 for bed mobility and able to stand with assist of 2 while maintaining TDWB LLE and use of RW. Not able to take steps today. Performed lateral scoot transfer towards right with Mod A of 2 and use of pad for assist. Pt fatigues. Continues to be in A-fib. Reviewed precautions for left knee with no ROM and importance of wearing KI when OOB. Will continue to follow.     Follow Up Recommendations  SNF;Supervision/Assistance - 24 hour     Equipment Recommendations  Other (comment)(defer)    Recommendations for Other Services       Precautions / Restrictions Precautions Precautions: Fall;Knee Precaution Comments: watch HR Required Braces or Orthoses: Knee Immobilizer - Left Knee Immobilizer - Left: On when out of bed or walking Restrictions Weight Bearing Restrictions: Yes LLE Weight Bearing: Touchdown weight bearing Other Position/Activity Restrictions: No L knee ROM    Mobility  Bed Mobility Overal bed mobility: Needs Assistance Bed Mobility: Supine to Sit     Supine to sit: +2 for physical assistance;Max assist;HOB elevated     General bed mobility comments: verbal cues to for technique, assist for R LE to EOB, to raise trunk and to position hips at EOB with bed pad.  Transfers Overall transfer level:  Needs assistance Equipment used: Rolling walker (2 wheeled) Transfers: Sit to/from Stand;Lateral/Scoot Transfers Sit to Stand: +2 physical assistance;Max assist        Lateral/Scoot Transfers: +2 physical assistance;Mod assist General transfer comment: stood from elevated bed with max assist of bed pad under hips to rise and assist to steady, pt able to maintain PWB on L LE for static standing, heavy reliance on BUEs; unable to pivot on Rt foot to transfer to chair, returned to sitting and performed lateral scoot to drop arm chair towards right with Mod A of 2 using pad and cues for technique.  Ambulation/Gait             General Gait Details: Unable   Stairs             Wheelchair Mobility    Modified Rankin (Stroke Patients Only)       Balance Overall balance assessment: Needs assistance Sitting-balance support: Feet supported;Bilateral upper extremity supported Sitting balance-Leahy Scale: Fair Sitting balance - Comments: sat x 10 minutes at EOB with min and then min guard assist   Standing balance support: During functional activity Standing balance-Leahy Scale: Poor Standing balance comment: requires B UE and support of 2 to stand                            Cognition Arousal/Alertness: Awake/alert Behavior During Therapy: WFL for tasks assessed/performed Overall Cognitive Status: Impaired/Different from baseline Area of Impairment: Orientation;Following commands;Problem solving  Orientation Level: Disoriented to;Place   Memory: Decreased short-term memory Following Commands: Follows one step commands with increased time   Awareness: Intellectual Problem Solving: Slow processing;Decreased initiation;Difficulty sequencing;Requires verbal cues;Requires tactile cues        Exercises      General Comments General comments (skin integrity, edema, etc.): Pt in A -fib during session.      Pertinent Vitals/Pain Pain  Assessment: Faces Faces Pain Scale: Hurts little more Pain Location: Lft knee Pain Descriptors / Indicators: Grimacing;Guarding Pain Intervention(s): Repositioned;Monitored during session    Home Living Family/patient expects to be discharged to:: Private residence Living Arrangements: Alone   Type of Home: House Home Access: Stairs to enter Entrance Stairs-Rails: Right;Left;Can reach both Home Layout: One level        Prior Function Level of Independence: Independent          PT Goals (current goals can now be found in the care plan section) Acute Rehab PT Goals Patient Stated Goal: get stronger and go back home Progress towards PT goals: Progressing toward goals    Frequency    Min 3X/week      PT Plan Current plan remains appropriate    Co-evaluation PT/OT/SLP Co-Evaluation/Treatment: Yes Reason for Co-Treatment: For patient/therapist safety;To address functional/ADL transfers;Necessary to address cognition/behavior during functional activity PT goals addressed during session: Mobility/safety with mobility;Balance OT goals addressed during session: ADL's and self-care;Proper use of Adaptive equipment and DME      AM-PAC PT "6 Clicks" Mobility   Outcome Measure  Help needed turning from your back to your side while in a flat bed without using bedrails?: A Lot Help needed moving from lying on your back to sitting on the side of a flat bed without using bedrails?: Total Help needed moving to and from a bed to a chair (including a wheelchair)?: Total Help needed standing up from a chair using your arms (e.g., wheelchair or bedside chair)?: Total Help needed to walk in hospital room?: Total Help needed climbing 3-5 steps with a railing? : Total 6 Click Score: 7    End of Session Equipment Utilized During Treatment: Left knee immobilizer Activity Tolerance: Patient tolerated treatment well Patient left: in chair;with call bell/phone within reach(with OT present  in room) Nurse Communication: Mobility status PT Visit Diagnosis: Other abnormalities of gait and mobility (R26.89);Pain Pain - Right/Left: Left Pain - part of body: Knee     Time: [id number] PT Time Calculation (min) (ACUTE ONLY): 32 min  Charges:  $Therapeutic Activity: 8-22 mins                     Marisa Severin, PT, DPT Acute Rehabilitation Services Pager [phone] Office [phone]       Marguarite Arbour A Sabra Heck 08/01/2019, 4:08 PM

## 2019-08-01 NOTE — Progress Notes (Signed)
[facility] ADULT ICU REPLACEMENT PROTOCOL FOR AM LAB REPLACEMENT ONLY  The patient does apply for the [facility] Adult ICU Electrolyte Replacment Protocol based on the criteria listed below:   1. Is GFR >/= 40 ml/min? Yes.    Patient's GFR today is >60 2. Is urine output >/= 0.5 ml/kg/hr for the last 6 hours? Yes.   Patient's UOP is 2.26 ml/kg/hr 3. Is BUN < 60 mg/dL? Yes.    Patient's BUN today is 20 4. Abnormal electrolyte(s): K+ 3.4 5. Ordered repletion with: protocol 6. If a panic level lab has been reported, has the CCM MD in charge been notified? Yes.  .   Physician:  Dr.Stretch  Carlisle Beers 08/01/2019 5:56 AM

## 2019-08-01 NOTE — Progress Notes (Signed)
Progress Note  Patient Name: Vernon Frye Date of Encounter: 08/01/2019  Primary Cardiologist: Sinclair Grooms, MD   Subjective   No CP or dyspnea; c/o knee pain   Inpatient Medications    Scheduled Meds: . acetaminophen  650 mg Oral Q6H  . chlorhexidine  15 mL Mouth Rinse BID  . Chlorhexidine Gluconate Cloth  6 each Topical Daily  . diazepam  5 mg Oral BID  . docusate sodium  100 mg Oral BID  . feeding supplement (PRO-STAT SUGAR FREE 64)  60 mL Per Tube BID  . folic acid  1 mg Oral Daily  . furosemide  40 mg Intravenous BID  . gabapentin  300 mg Oral TID  . insulin aspart  0-6 Units Subcutaneous Q4H  . lisinopril  10 mg Oral Daily  . mouth rinse  15 mL Mouth Rinse q12n4p  . metoprolol tartrate  25 mg Oral Q6H  . morphine  15 mg Oral Q12H  . multivitamin with minerals  1 tablet Oral Daily  . pantoprazole (PROTONIX) IV  40 mg Intravenous Q24H  . potassium chloride  40 mEq Oral Q6H  . potassium chloride  20 mEq Oral Q4H  . QUEtiapine  25 mg Oral BID  . sodium chloride flush  10-40 mL Intracatheter Q12H  . thiamine  100 mg Oral Daily   Or  . thiamine  100 mg Intravenous Daily   Continuous Infusions: . amiodarone 30 mg/hr (08/01/19 0816)  . dexmedetomidine (PRECEDEX) IV infusion Stopped (07/31/19 1211)  . feeding supplement (VITAL 1.5 CAL) Stopped (07/31/19 0800)  . heparin 2,800 Units/hr (08/01/19 0700)  . magnesium sulfate bolus IVPB    . vancomycin 62.5 mL/hr at 08/01/19 0700   PRN Meds: docusate, haloperidol lactate, HYDROmorphone (DILAUDID) injection, LORazepam, menthol-cetylpyridinium **OR** phenol, ondansetron **OR** ondansetron (ZOFRAN) IV, oxyCODONE, Resource ThickenUp Clear, sodium chloride flush, sodium chloride flush   Vital Signs    Vitals:   08/01/19 0530 08/01/19 0600 08/01/19 0630 08/01/19 0700  BP: (!) 179/89 (!) 189/96 (!) 180/97 (!) 178/90  Pulse: (!) 123 (!) 123 (!) 134 (!) 134  Resp: (!) 25 (!) 24 19 (!) 32  Temp:      TempSrc:       SpO2: 98% 98% 98% 100%  Weight:      Height:        Intake/Output Summary (Last 24 hours) at 08/01/2019 0835 Last data filed at 08/01/2019 0700 Gross per 24 hour  Intake 1456.44 ml  Output 2250 ml  Net -793.56 ml   Last 3 Weights 08/01/2019 07/31/2019 07/30/2019  Weight (lbs) 234 lb 12.6 oz 231 lb 14.8 oz 227 lb 11.8 oz  Weight (kg) 106.5 kg 105.2 kg 103.3 kg      Telemetry    Atrial fibrillation with RVR - Personally Reviewed  Physical Exam   GEN: No acute distress.   Neck: No JVD Cardiac: irregular and tachycardic Respiratory: Clear to auscultation bilaterally. GI: Soft, nontender, non-distended  MS: LLE; knee in wrap; 1+ edema Neuro:  Nonfocal  Psych: Normal affect   Labs    Chemistry Recent Labs  Lab 07/28/19 0959 07/30/19 0506 07/31/19 0437 08/01/19 0307  NA 144 139 142 140  K 3.6 3.3* 3.3* 3.4*  CL 114* 105 108 106  CO2 22 22 24 25   GLUCOSE 166* 200* 214* 154*  BUN 18 21* 21* 20  CREATININE 0.84 0.93 0.76 0.75  CALCIUM 8.0* 7.4* 7.6* 7.8*  PROT 5.8*  --   --   --  ALBUMIN 1.7*  --   --   --   AST 54*  --   --   --   ALT 76*  --   --   --   ALKPHOS 109  --   --   --   BILITOT 0.9  --   --   --   GFRNONAA >60 >60 >60 >60  GFRAA >60 >60 >60 >60  ANIONGAP 8 12 10 9      Hematology Recent Labs  Lab 07/30/19 1043 07/31/19 0437 08/01/19 0307  WBC 9.9 8.0 10.1  RBC 2.92* 2.66* 3.02*  HGB 8.4* 7.6* 8.7*  HCT 27.3* 25.3* 28.2*  MCV 93.5 95.1 93.4  MCH 28.8 28.6 28.8  MCHC 30.8 30.0 30.9  RDW 15.4 15.3 15.1  PLT 201 174 207    Patient Profile     Vernon Frye is a 59 y.o. male with admitted with sepsis 12/16, infected L knee (prosthetic). Course complicated by MRSA bacteremia and Afib with RVR. S/p removal of hardware and antibiotic spacer.   Echocardiogram shows normal LV function, trace mitral and tricuspid regurgitation.  Assessment & Plan    1 atrial fibrillation with rapid ventricular response-felt secondary to stress of sepsis/knee  infection.  Continue heparin at present dose.  Continue amiodarone.  Rate remains elevated.  Increase metoprolol to 50 mg every 6 hours.  2 MRSA bacteremia-felt secondary to infected left knee prosthesis.  Will need transesophageal echocardiogram sometime this week to exclude vegetation.  I will arrange.  3 infected prosthetic left knee-continue vancomycin.  4 hypertension-blood pressure elevated.  I am increasing metoprolol for atrial fibrillation.  Follow blood pressure and advance regimen as needed.  For questions or updates, please contact [place] Please consult [email or web address] for contact info under        Signed, Kirk Ruths, MD  08/01/2019, 8:35 AM

## 2019-08-01 NOTE — Progress Notes (Signed)
eLink Physician-Brief Progress Note Patient Name: Vernon Frye DOB: [birth year] MRN: [id number]   Date of Service  08/01/2019  HPI/Events of Note  Ongoing pain, although currently sleeping. Of note, patient takes MS Contin 15 mg BID at home.  HR still elevated in 130s-140s. He is on Toprol XL 100mg  qday at home.   eICU Interventions  Increase dilaudid to 4mg  PO Q4H with fentanyl PRN as well. Primary team to consider in AM restarting MS Contin.  Start metop tartrate 12.5mg  PO Q6H for rate control, increasing as needed.     Intervention Category Intermediate Interventions: Pain - evaluation and management  Marily Lente Alsha Meland 08/01/2019, 2:28 AM

## 2019-08-01 NOTE — Evaluation (Signed)
Occupational Therapy Re-Evaluation Patient Details Name: Vernon Frye MRN: [id number] DOB: May 23, 1961 Today's Date: 08/01/2019    History of Present Illness 59 yo M PMH Afib, HTN, L TKR (2016) who presented to ED with CC L knee pain. Pain began 12/14 and has progressed prompting presentation. 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 Desaturated with hypoxemic respiratory failure requiring 3LNC. Pt underwent L TKR with placement of antibiotic spacers on 12/29, agitated after procedure and unable to be extubated. Extubated 07/31/19.   Clinical Impression   Pt reassessed after extubation due to significant change in status. Pt immediately willing to work with therapies on early mobility and seated grooming. Improved cognition, but continues to demonstrate slow processing and questioning his location as being in [place]. Following commands consistently with increased time. Pt demonstrates generalized weakness, L knee pain and poor standing balance. He was able to sit with min guard assist x 10 minutes prior to attempting to stand. Pt unable to pivot with RW, transferred to drop arm recliner via lateral scoot with 2 person assist. Pt washed face and performed oral care once in chair. He was pleased to see progress. Recommending SNF for further rehab.    Follow Up Recommendations  SNF;Supervision/Assistance - 24 hour    Equipment Recommendations  Other (comment)(defer to next venue)    Recommendations for Other Services       Precautions / Restrictions Precautions Precautions: Fall;Knee Precaution Comments: watch HR Required Braces or Orthoses: Knee Immobilizer - Left Knee Immobilizer - Left: On when out of bed or walking Restrictions Weight Bearing Restrictions: Yes LLE Weight Bearing: Touchdown weight bearing Other Position/Activity Restrictions: No L knee ROM      Mobility Bed Mobility Overal bed mobility:  Needs Assistance Bed Mobility: Supine to Sit     Supine to sit: +2 for physical assistance;Max assist     General bed mobility comments: verbal cues to for technique, assist for R LE to EOB, to raise trunk and to position hips at EOB with bed pad  Transfers Overall transfer level: Needs assistance Equipment used: Rolling walker (2 wheeled);None Transfers: Sit to/from Stand;Lateral/Scoot Transfers Sit to Stand: +2 physical assistance;Max assist        Lateral/Scoot Transfers: +2 physical assistance;Mod assist General transfer comment: stood from elevated bed with max assist of bed pad under hips to rise and assist to steady, pt able to maintain PWB on L LR for static standing, but unable to pivot on R foot to transfer to chair, returned to sitting and performed lateral scoot to drop arm chair    Balance Overall balance assessment: Needs assistance Sitting-balance support: Bilateral upper extremity supported;Feet supported Sitting balance-Leahy Scale: Fair Sitting balance - Comments: sat x 10 minutes at EOB with min and then min guard assist     Standing balance-Leahy Scale: Poor Standing balance comment: requires B UE and support of 2 to stand                           ADL either performed or assessed with clinical judgement   ADL Overall ADL's : Needs assistance/impaired Eating/Feeding: NPO   Grooming: Oral care;Wash/dry face;Set up;Sitting   Upper Body Bathing: Moderate assistance;Sitting   Lower Body Bathing: Total assistance;+2 for physical assistance;Sit to/from stand   Upper Body Dressing : Moderate assistance;Sitting   Lower Body Dressing: Total assistance;Bed level  General ADL Comments: goal of session to transfer to chair     Vision Patient Visual Report: No change from baseline       Perception     Praxis      Pertinent Vitals/Pain Pain Assessment: Faces Faces Pain Scale: Hurts little more Pain Location: L  knee Pain Descriptors / Indicators: Grimacing;Guarding Pain Intervention(s): Monitored during session;Repositioned     Hand Dominance Right   Extremity/Trunk Assessment Upper Extremity Assessment Upper Extremity Assessment: LUE deficits/detail;RUE deficits/detail RUE Deficits / Details: edematous, generalized weakness RUE Coordination: decreased fine motor LUE Deficits / Details: longstanding shoulder limitations to 90 degrees, +crepitus, edematous LUE Coordination: decreased gross motor;decreased fine motor   Lower Extremity Assessment Lower Extremity Assessment: Defer to PT evaluation   Cervical / Trunk Assessment Cervical / Trunk Assessment: Normal   Communication Communication Communication: Expressive difficulties(low volume)   Cognition Arousal/Alertness: Awake/alert Behavior During Therapy: WFL for tasks assessed/performed Overall Cognitive Status: Impaired/Different from baseline Area of Impairment: Orientation;Following commands;Problem solving                 Orientation Level: Disoriented to;Place   Memory: Decreased short-term memory Following Commands: Follows one step commands with increased time   Awareness: Intellectual Problem Solving: Slow processing;Decreased initiation;Difficulty sequencing;Requires verbal cues;Requires tactile cues     General Comments       Exercises     Shoulder Instructions      Home Living Family/patient expects to be discharged to:: Private residence Living Arrangements: Alone   Type of Home: House Home Access: Stairs to enter [employer] of Steps: 2 Entrance Stairs-Rails: Right;Left;Can reach both Home Layout: One level     Bathroom Shower/Tub: Teacher, early years/pre: Handicapped height                Prior Functioning/Environment Level of Independence: Independent                 OT Problem List: Decreased strength;Decreased range of motion;Decreased activity  tolerance;Impaired balance (sitting and/or standing);Decreased knowledge of use of DME or AE;Decreased cognition;Decreased safety awareness;Pain      OT Treatment/Interventions: Self-care/ADL training;DME and/or AE instruction;Patient/family education;Balance training;Therapeutic activities    OT Goals(Current goals can be found in the care plan section) Acute Rehab OT Goals Patient Stated Goal: get stronger and go back home OT Goal Formulation: With patient Time For Goal Achievement: 08/15/19 Potential to Achieve Goals: Good ADL Goals Pt Will Perform Grooming: with min assist;standing(one activity) Pt Will Perform Upper Body Bathing: with min assist;sitting Pt Will Transfer to Toilet: with mod assist;ambulating;bedside commode Pt Will Perform Toileting - Clothing Manipulation and hygiene: with mod assist;sit to/from stand Additional ADL Goal #1: Pt will perform bed mobility with min assist in preparation for ADL.  OT Frequency: Min 2X/week   Barriers to D/C: Decreased caregiver support          Co-evaluation PT/OT/SLP Co-Evaluation/Treatment: Yes     OT goals addressed during session: ADL's and self-care;Proper use of Adaptive equipment and DME      AM-PAC OT "6 Clicks" Daily Activity     Outcome Measure Help from another person eating meals?: Total Help from another person taking care of personal grooming?: A Little Help from another person toileting, which includes using toliet, bedpan, or urinal?: Total Help from another person bathing (including washing, rinsing, drying)?: A Lot Help from another person to put on and taking off regular upper body clothing?: A Lot Help from another person to put on and  taking off regular lower body clothing?: Total 6 Click Score: 10   End of Session Equipment Utilized During Treatment: Gait belt;Rolling walker Nurse Communication: Mobility status(condom cath off, IV alarming)  Activity Tolerance: Patient tolerated treatment  well Patient left: in chair;with call bell/phone within reach;with chair alarm set;with nursing/sitter in room  OT Visit Diagnosis: Muscle weakness (generalized) (M62.81);Pain;Other symptoms and signs involving cognitive function;Unsteadiness on feet (R26.81)                Time: 1240-1320 OT Time Calculation (min): 40 min Charges:  OT General Charges $OT Visit: 1 Visit OT Evaluation $OT Eval Moderate Complexity: 1 Mod  Nestor Lewandowsky, OTR/L Acute Rehabilitation Services Pager: [phone] Office: [phone] Malka So 08/01/2019, 1:52 PM

## 2019-08-01 NOTE — Progress Notes (Signed)
[place] for Heparin  Indication: atrial fibrillation  Allergies  Allergen Reactions  . Vancomycin Shortness Of Breath and Rash    [name]     Patient Measurements: Height: 6\' 3"  (190.5 cm) Weight: 234 lb 12.6 oz (106.5 kg) IBW/kg (Calculated) : 84.5  Vital Signs: Temp: 99.2 F (37.3 C) (01/04 1100) Temp Source: Oral (01/04 1100) BP: 174/93 (01/04 1000) Pulse Rate: 140 (01/04 1000)  Labs: Recent Labs    07/30/19 0506 07/30/19 0506 07/30/19 1043 07/30/19 1402 07/31/19 0437 08/01/19 0307  HGB  --    < > 8.4*  --  7.6* 8.7*  HCT  --   --  27.3*  --  25.3* 28.2*  PLT  --   --  201  --  174 207  HEPARINUNFRC 0.27*  --   --  0.41 0.38 0.32  CREATININE 0.93  --   --   --  0.76 0.75   < > = values in this interval not displayed.    Estimated Creatinine Clearance: 132.8 mL/min (by C-G formula based on SCr of 0.75 mg/dL).   Medical History: Past Medical History:  Diagnosis Date  . Anxiety    takes Xanax daily  . Arthritis   . Chronic back pain    DDD  . Complication of anesthesia    agitated when awaking   . H/O rotator cuff tear   . History of kidney stones   . Hypertension    takes Toprol,Prinizide,and Amlodipine daily  . Joint pain   . Joint swelling   . Laceration    history of to right lower extermity   . Night muscle spasms    takes Soma daily  . Numbness and tingling    hands and feet   . Pneumonia    history of   . Tinnitus     Assessment: Anti-coagulation for afib held earlier this admission for surgical site bleeding, Hgb low but stable for a few days, heparin was restarted 1/1 with close monitoring of H/H and aiming for lower heparin level goal.  Heparin level is therapeutic at 0.32, H/H and pltc wnl.  Goal of Therapy:  Heparin level 0.3-0.5 units/ml, recent bleeding Monitor platelets by anticoagulation protocol: Yes   Plan:  Continue heparin at 2800 units/hr Daily heparin level and  CBC   Arrie Senate, PharmD, BCPS Clinical Pharmacist [phone] Please check AMION for all [place] numbers 08/01/2019

## 2019-08-01 NOTE — Progress Notes (Signed)
  PHARMACY CONSULT NOTE FOR:  OUTPATIENT  PARENTERAL ANTIBIOTIC THERAPY (OPAT)  Indication: bacteremia Regimen: vancomycin 1250mg  IV q12h End date: 09/06/19  IV antibiotic discharge orders are pended. To discharging provider:  please sign these orders via discharge navigator,  Select New Orders & click on the button choice - Manage This Unsigned Work.     Thank you for allowing pharmacy to be a part of this patient's care.   Arrie Senate, PharmD, BCPS Clinical Pharmacist [phone] Please check AMION for all [place] numbers 08/01/2019

## 2019-08-01 NOTE — Progress Notes (Addendum)
NAME:  Vernon Frye, MRN:  [id number], DOB:  [birth year], LOS: 61 ADMISSION DATE:  07/12/2019, CONSULTATION DATE:  07/13/19 REFERRING MD:  [place] - [facility], CHIEF COMPLAINT:  L knee pain  Brief History   59 yo M PMH Afib, HTN, L TKR (2016) who presented to ED with CC L knee pain. 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   Underwent left knee revision and replacement with placement of antibiotic spacer 12/20, patient remained intubated post procedure.  Overnight of 01/01 patient seen with spike in temperature of 102.6, tachycardia, and hypertension with development of very loose mucousy stools.  Given extended use of IV antibiotics concern for C. difficile.    01/03: No acute events overnight, C.diff negative.    01/04: Overnight, A.fib with RVR for which started on metoprolol tartrate. Also c/o severe pain for which increased dilaudid in addition to fentanyl prn.   Past Medical History  A fib HTN Anxiety L TKR  L shoulder arthroscopy  DDD  Significant Hospital Events   12/16 presents to ED for L knee pain. Concern for septic joint. A fiv RVR. Developed SOB after vanc administration. PCCM consulted for admission Underwent left knee Irrigation and debridement, total knee with poly exchange     12/29: L knee revision and replacement, remains intubated post operatively   01/02: Overnight of 01/01 patient seen with spike in temperature of 102.6, tachycardia, and hypertension with development of very loose mucousy stools.  Given extended use of IV antibiotics concern for C. difficile.    Consults:  Ortho  Cardiology ID EP  Procedures:  ETT 12/29 >> 01/03  Significant Diagnostic Tests:  12/19 ECHO > LVEF 55-60%  12/15 L knee XR > prior L knee replacement. Moderate joint effusion, no body abnormality   12/16 Renal ultrasound > Mild left-sided hydronephrosis is noted.  12/21 MRI lumbar spine DJD, no neural  impingement, no discitis  12/24 UE venous: Right: No evidence of thrombosis in the subclavian. Left:No evidence of deep vein thrombosis in the upper extremity. Findings consistent with acute superficial vein thrombosis involving the left cephalic vein.  12/27 cth: No acute intracranial abnormality. Non contrast CT appearance of the brain within normal limits for age. 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  Micro Data:  12/16 SARS Cov2 > negative  12/16 BCx > positive Staph MRSA  12/16 Strep A > Negative  12/16 Flu A/B > neg 12/16 MRSA PCR > positive  12/16 Body culture left knee > Abundant gram positive cocci  12/18 blood: negative 01/02: C. Diff > negative  01/02 blood culture: no growth <24hrs  01/02 urine culture > Gram negative rods   Antimicrobials:  12/16 vanc x1 12/16 ceftriaxone x1 12/16 Zosyn > 12/16 Daptomycin 12/17 >12/21 vanc 12/22 > 6 weeks planned thru 09/06/2019  Interim history/subjective:  Patient extubated yesterday. He continues to have significant bilateral lower extremity pain (takes MS Contin 15mg  bid at home) for which his dilaudid was increased and fentanyl added. A.fib with RVR to 130-140s for which metoprolol tartrate started for rate control. This morning, patient reports continued pain but slightly improved with pain medication.   Objective   Blood pressure (!) 178/90, pulse (!) 134, temperature 98.3 F (36.8 C), temperature source Oral, resp. rate (!) 32, height 6\' 3"  (1.905 m), weight 106.5 kg, SpO2 100 %.    Vent Mode: PSV;CPAP FiO2 (%):  [30 %-32 %] 32 % PEEP:  [  5 cmH20] 5 cmH20 Pressure Support:  [10 cmH20] 10 cmH20   Intake/Output Summary (Last 24 hours) at 08/01/2019 0737 Last data filed at 08/01/2019 0700 Gross per 24 hour  Intake 1522.41 ml  Output 2250 ml  Net -727.59 ml   Filed Weights   07/30/19 0500 07/31/19 0500 08/01/19 0500  Weight: 103.3 kg 105.2 kg 106.5 kg    Examination: General:  Chronically ill appearing male, in NAD HEENT: ETT, MM pink/moist, PERRL,  Neuro: Alert and able to follow commands, no focal deficits on examination CV: tachycardia with irregular rate and rhythm, no murmur, rubs, or gallops,  PULM: Clear to auscultation bilaterally without any additional breath sounds GI: soft, non-distended, non-tender to palpation, bowel sounds active in all 4 quadrants Extremities: warm/dry, generalized 1-2+ pitting edema    Resolved Hospital Problem list   Acute kidney injury Post operative respiratory insufficiency  Acute encephalopathy secondary to agitated delirium   Assessment & Plan:  MRSA Bacteremia  -resolved bacteremia, on 6 weeks vanc per ID -repeat cx negative 12/18, BCx 01/02 with no growth for 24hrs.  -MRI negative for spinal abscess or osteomyelitis.  -TTE 12/16 w/ EF 55-60% with LV regional wall motion abnormalities, global RV reduced systolic functon, degenerative mitral valve, trivial mitral and tricuspid valve regurgitation; no evidence of aortiv calve sclerosis or stenosis P:  TEE for evaluation of endocarditis per cardiology recs  Continue IV vancomycin per ID   Afib with RVR -Non-adherent w eliquis outpatient (2/2 financial constraints), on metoprolol succinate 200mg  qd -HR 130-140s on amiodarone gtt and metoprolol tartrate 12.5mg  q6h - Likely in setting of agitation and pain P  Cardiology following, appreciate assistance Continue amiodarone drip Increase metoprolol tartrate to 25mg  q6h Pain control with MS contin,  dilaudid and fentanyl prn IV magnesium  IV heparin  Generalized edema:  - Likely secondary to critical illness and fluids. Continues to have generalized edema.  P Continue Lasix 40mg  bid  Strict I/O   Gram negative rod urine cx:  - Pt is currently afebrile, without leukocytosis and hemodynamically stable, asymptomatic  P: Continue to monitor   Left Knee Septic joint -suspect septic joint in setting of L TKR in 2016  -Underwent I&D with partial hardwear exchange 12/16 -Total knee revision with removal of left total knee replacement and insertion of antibiotic spacers 12/29 P Management per Ortho PT/OT eval  Hx EtOH and substance use -Hx c/w prior heavy use -UDS pos for Amphetamines, BZD, Opioids (Home opioids for pain, BZD for anxiety) - Off precedex  P Continue thiamine, folate, and multivitamin Scheduled Valium and Seroquel As needed Haldol and fentanyl   Prediabetes -HbA1c 6.3 P: - CBG q4h - SSI  Normocytic anemia:  P:  Stable, anticoagulation resumed 1/1  Best practice:  Diet: liquid diet, advance as tolerated  Pain/Anxiety/Delirium protocol (if indicated): ms contin, fentanyl, dilaudid, haldol prn VAP protocol (if indicated): none DVT prophylaxis: SCD's  GI prophylaxis: PPI  Glucose control: monitor Mobility: OOB to chair as tolerated  Code Status: Full  Family Communication: will update family  Disposition: ICU  Labs   CBC: Recent Labs  Lab 07/28/19 0959 07/29/19 0243 07/30/19 1043 07/31/19 0437 08/01/19 0307  WBC 8.2 7.3 9.9 8.0 10.1  HGB 8.7* 8.2* 8.4* 7.6* 8.7*  HCT 28.0* 26.4* 27.3* 25.3* 28.2*  MCV 92.4 93.3 93.5 95.1 93.4  PLT 228 187 201 174 [id number]    Basic Metabolic Panel: Recent Labs  Lab 07/26/19 0404 07/26/19 1719 07/28/19 0522 07/28/19 0959 07/29/19 0243 07/29/19  1700 07/30/19 0506 07/31/19 0437 08/01/19 0307  NA 144  --  142 144 143 142 139 142 140  K 3.3*  --  3.3* 3.6 3.6 3.3* 3.3* 3.3* 3.4*  CL 113*   < > 110 114* 114* 107 105 108 106  CO2 22   < > 23 22 21* 25 22 24 25   GLUCOSE 144*   < > 194* 166* 191* 146* 200* 214* 154*  BUN 15   < > 18 18 18 17  21* 21* 20  CREATININE 0.70   < > 0.82 0.84 0.79 0.84 0.93 0.76 0.75  CALCIUM 7.9*   < > 7.9* 8.0* 7.7* 7.5* 7.4* 7.6* 7.8*  MG 1.6*   < > 1.7 1.7 1.7 1.6* 1.5*  --   --   PHOS 2.8  --  3.1 3.1 2.7  --   --   --   --    < > = values in this interval not displayed.   GFR: Estimated  Creatinine Clearance: 132.8 mL/min (by C-G formula based on SCr of 0.75 mg/dL). Recent Labs  Lab 07/29/19 0243 07/30/19 1043 07/31/19 0437 08/01/19 0307  WBC 7.3 9.9 8.0 10.1    Liver Function Tests: Recent Labs  Lab 07/28/19 0959  AST 54*  ALT 76*  ALKPHOS 109  BILITOT 0.9  PROT 5.8*  ALBUMIN 1.7*   No results for input(s): LIPASE, AMYLASE in the last 168 hours. No results for input(s): AMMONIA in the last 168 hours.  ABG    Component Value Date/Time   PHART 7.404 07/26/2019 1719   PCO2ART 33.8 07/26/2019 1719   PO2ART 180.0 (H) 07/26/2019 1719   HCO3 21.2 07/26/2019 1719   TCO2 22 07/26/2019 1719   ACIDBASEDEF 3.0 (H) 07/26/2019 1719   O2SAT 100.0 07/26/2019 1719     Coagulation Profile: No results for input(s): INR, PROTIME in the last 168 hours.  Cardiac Enzymes: No results for input(s): CKTOTAL, CKMB, CKMBINDEX, TROPONINI in the last 168 hours.  HbA1C: Hgb A1c MFr Bld  Date/Time Value Ref Range Status  07/27/2019 03:56 AM 5.2 4.8 - 5.6 % Final    Comment:    (NOTE) Pre diabetes:          5.7%-6.4% Diabetes:              >6.4% Glycemic control for   <7.0% adults with diabetes   07/13/2019 04:08 PM 6.3 (H) 4.8 - 5.6 % Final    Comment:    (NOTE) Pre diabetes:          5.7%-6.4% Diabetes:              >6.4% Glycemic control for   <7.0% adults with diabetes     CBG: Recent Labs  Lab 07/31/19 1113 07/31/19 1610 07/31/19 2001 07/31/19 2334 08/01/19 0344  GLUCAP 149* 117* 124* 128* 124*     Critical care time:   CRITICAL CARE Performed by: Harvie Heck Internal Medicine, PGY-1 08/01/2019 7:37 AM  Total critical care time: 30 minutes  Critical care time was exclusive of separately billable procedures and treating other patients.  Critical care was necessary to treat or prevent imminent or life-threatening deterioration.  Critical care was time spent personally by me on the following activities: development of treatment plan with  patient and/or surrogate as well as nursing, discussions with consultants, evaluation of patient's response to treatment, examination of patient, obtaining history from patient or surrogate, ordering and performing treatments and interventions, ordering and review of laboratory studies,  ordering and review of radiographic studies, pulse oximetry and re-evaluation of patient's condition.

## 2019-08-02 ENCOUNTER — Inpatient Hospital Stay (HOSPITAL_COMMUNITY): Payer: Self-pay | Admitting: Certified Registered Nurse Anesthetist

## 2019-08-02 ENCOUNTER — Inpatient Hospital Stay (HOSPITAL_COMMUNITY): Payer: Self-pay

## 2019-08-02 ENCOUNTER — Encounter (HOSPITAL_COMMUNITY)
Admission: EM | Disposition: A | Discharge status: Home Health | Payer: Self-pay | Source: Home / Self Care | Attending: Internal Medicine

## 2019-08-02 DIAGNOSIS — I4819 Other persistent atrial fibrillation: Secondary | ICD-10-CM

## 2019-08-02 HISTORY — PX: CARDIOVERSION: SHX1299

## 2019-08-02 HISTORY — PX: TEE WITHOUT CARDIOVERSION: SHX5443

## 2019-08-02 LAB — BASIC METABOLIC PANEL
Anion gap: 9 (ref 5–15)
BUN: 17 mg/dL (ref 6–20)
CO2: 24 mmol/L (ref 22–32)
Calcium: 7.9 mg/dL — ABNORMAL LOW (ref 8.9–10.3)
Chloride: 104 mmol/L (ref 98–111)
Creatinine, Ser: 0.8 mg/dL (ref 0.61–1.24)
GFR calc Af Amer: >60 mL/min (ref >60)
GFR calc non Af Amer: >60 mL/min (ref >60)
Glucose, Bld: 134 mg/dL — ABNORMAL HIGH (ref 70–99)
Potassium: 3.7 mmol/L (ref 3.5–5.1)
Sodium: 137 mmol/L (ref 135–145)

## 2019-08-02 LAB — CBC
HCT: 27.7 % — ABNORMAL LOW (ref 39.0–52.0)
Hemoglobin: 8.6 g/dL — ABNORMAL LOW (ref 13.0–17.0)
MCH: 29.1 pg (ref 26.0–34.0)
MCHC: 31 g/dL (ref 30.0–36.0)
MCV: 93.6 fL (ref 80.0–100.0)
Platelets: 224 10*3/uL (ref 150–400)
RBC: 2.96 MIL/uL — ABNORMAL LOW (ref 4.22–5.81)
RDW: 15.2 % (ref 11.5–15.5)
WBC: 10.2 10*3/uL (ref 4.0–10.5)
nRBC: 0 % (ref 0.0–0.2)

## 2019-08-02 LAB — HEPARIN LEVEL (UNFRACTIONATED): Heparin Unfractionated: 0.36 IU/mL (ref 0.30–0.70)

## 2019-08-02 LAB — GLUCOSE, CAPILLARY
Glucose-Capillary: 108 mg/dL — ABNORMAL HIGH (ref 70–99)
Glucose-Capillary: 122 mg/dL — ABNORMAL HIGH (ref 70–99)
Glucose-Capillary: 123 mg/dL — ABNORMAL HIGH (ref 70–99)
Glucose-Capillary: 131 mg/dL — ABNORMAL HIGH (ref 70–99)
Glucose-Capillary: 140 mg/dL — ABNORMAL HIGH (ref 70–99)
Glucose-Capillary: 143 mg/dL — ABNORMAL HIGH (ref 70–99)
Glucose-Capillary: 147 mg/dL — ABNORMAL HIGH (ref 70–99)

## 2019-08-02 LAB — MAGNESIUM: Magnesium: 2 mg/dL (ref 1.7–2.4)

## 2019-08-02 SURGERY — ECHOCARDIOGRAM, TRANSESOPHAGEAL
Anesthesia: Monitor Anesthesia Care

## 2019-08-02 MED ORDER — ENSURE ENLIVE PO LIQD
237.0000 mL | Freq: Two times a day (BID) | ORAL | Status: DC
  Administered 2019-08-02 – 2019-09-07 (×73): 237 mL via ORAL

## 2019-08-02 MED ORDER — AMIODARONE HCL 200 MG PO TABS
200.0000 mg | ORAL_TABLET | Freq: Every day | ORAL | Status: DC
  Administered 2019-08-02 – 2019-09-07 (×37): 200 mg via ORAL
  Filled 2019-08-02 (×37): qty 1

## 2019-08-02 MED ORDER — LACTATED RINGERS IV SOLN: INTRAVENOUS | Status: DC | PRN

## 2019-08-02 MED ORDER — FUROSEMIDE 10 MG/ML IJ SOLN: 40.0000 mg | Freq: Every day | INTRAMUSCULAR | Status: DC

## 2019-08-02 MED ORDER — PROPOFOL 500 MG/50ML IV EMUL
INTRAVENOUS | Status: DC | PRN
  Administered 2019-08-02: 125 ug/kg/min via INTRAVENOUS

## 2019-08-02 MED ORDER — FUROSEMIDE 10 MG/ML IJ SOLN
INTRAMUSCULAR | Status: AC
  Filled 2019-08-02: qty 4

## 2019-08-02 MED ORDER — PROPOFOL 10 MG/ML IV BOLUS
INTRAVENOUS | Status: DC | PRN
  Administered 2019-08-02: 50 mg via INTRAVENOUS
  Administered 2019-08-02: 80 mg via INTRAVENOUS

## 2019-08-02 NOTE — Progress Notes (Addendum)
  Echocardiogram Echocardiogram Transesophageal has been performed.  Ladon Vandenberghe A Ezequiel Macauley 08/02/2019, 4:11 PM

## 2019-08-02 NOTE — H&P (Signed)
   INTERVAL PROCEDURE H&P  History and Physical Interval Note:  08/02/2019 2:58 PM  Vernon Frye has presented today for their planned procedure. The various methods of treatment have been discussed with the patient and family. After consideration of risks, benefits and other options for treatment, the patient has consented to the procedure.  The patients' outpatient history has been reviewed, patient examined, and no change in status from most recent office note within the past 30 days. I have reviewed the patients' chart and labs and will proceed as planned. Questions were answered to the patient's satisfaction.   Vernon Casino, MD, [facility], [place] Director of the [facility]  Card[facility] Diplomate of the American Board of Clinical Lipidology Attending Cardiologist  Direct Dial: [phone]  Fax: [phone]  Website:  [email or web address] Vernon Frye 08/02/2019, 2:58 PM

## 2019-08-02 NOTE — Anesthesia Preprocedure Evaluation (Signed)
Anesthesia Evaluation  Patient identified by MRN, date of birth, ID band Patient confused    Reviewed: Allergy & Precautions, NPO status , Patient's Chart, lab work & pertinent test results  History of Anesthesia Complications Negative for: history of anesthetic complications  Airway Mallampati: II   Neck ROM: Full    Dental  (+) Poor Dentition   Pulmonary former smoker,    Pulmonary exam normal        Cardiovascular hypertension, Pt. on medications + dysrhythmias Atrial Fibrillation  Rhythm:Irregular Rate:Tachycardia  IMPRESSIONS     1. Left ventricular ejection fraction, by visual estimation, is 55 to 60%. The left ventricle has normal function. There is mildly increased left ventricular hypertrophy. 2. Left ventricular diastolic function could not be evaluated. 3. Right ventricular volume/pressure overload. 4. The left ventricle demonstrates regional wall motion abnormalities. 5. Global right ventricle has severely reduced systolic function.The right ventricular size is severely enlarged. No increase in right ventricular wall thickness. 6. Left atrial size was normal. 7. Right atrial size was mild-moderately dilated. 8. Presence of pericardial fat pad. 9. Trivial pericardial effusion is present. 10. Mild mitral annular calcification. 11. The mitral valve is degenerative. Trivial mitral valve regurgitation. 12. The tricuspid valve is grossly normal. Tricuspid valve regurgitation is trivial. 13. The aortic valve is tricuspid. Aortic valve regurgitation is not visualized. No evidence of aortic valve sclerosis or stenosis. 14. The pulmonic valve was grossly normal. Pulmonic valve regurgitation is not visualized. 15. TR signal is inadequate for assessing pulmonary artery systolic pressure. 16. The inferior vena cava is normal in size with greater than 50% respiratory variability, suggesting right atrial pressure of 3 mmHg. 17.  No prior Echocardiogram.   Neuro/Psych Anxiety    GI/Hepatic negative GI ROS, Neg liver ROS,   Endo/Other  negative endocrine ROS  Renal/GU      Musculoskeletal  (+) Arthritis ,   Abdominal   Peds  Hematology   Anesthesia Other Findings   Reproductive/Obstetrics                             Anesthesia Physical  Anesthesia Plan  ASA: III  Anesthesia Plan: MAC   Post-op Pain Management:    Induction: Intravenous  PONV Risk Score and Plan: 2 and Ondansetron, Treatment may vary due to age or medical condition and Midazolam  Airway Management Planned: Nasal Cannula  Additional Equipment:   Intra-op Plan:   Post-operative Plan:   Informed Consent: I have reviewed the patients History and Physical, chart, labs and discussed the procedure including the risks, benefits and alternatives for the proposed anesthesia with the patient or authorized representative who has indicated his/her understanding and acceptance.       Plan Discussed with: Anesthesiologist, Surgeon and CRNA  Anesthesia Plan Comments:         Anesthesia Quick Evaluation

## 2019-08-02 NOTE — Progress Notes (Signed)
Patient remains AOx4.  His vitals signs remain to be Tachycardia with A-fib.  Patient saturating 100% on room air. He has slept the majority of the morning.  His TEE was rescheduled from noon to 1500 because his morning medication delivered at 0900 caused concern for aspiration by the procedure team.  Patient remains on 2800 units Heparin.  His left leg that has been operated for prior surgical implantation infection is healing.  There are no signs of surgical incision infection at incision site.  He has Pallor and paraesthesia on left foot.  When I assessed his left foot and touched his left big toe he stated I was touching his smallest toe.  On movement patient is barely able to move leg and cannot lift off of mattress.  He has extensive pitting edema to all extremities. Notified physician about patient recovery process of the left lower extremity.

## 2019-08-02 NOTE — Progress Notes (Signed)
Nutrition Follow up  DOCUMENTATION CODES:   Non-severe (moderate) malnutrition in context of social or environmental circumstances  INTERVENTION:    Ensure Enlive po BID, each supplement provides 350 kcal and 20 grams of protein  MVI daily   NUTRITION DIAGNOSIS:   Moderate Malnutrition related to social / environmental circumstances as evidenced by percent weight loss, mild muscle depletion.  Ongoing  GOAL:   Patient will meet greater than or equal to 90% of their needs   Diet just advanced   MONITOR:   Diet advancement, Vent status, Skin, TF tolerance, Weight trends, Labs, I & O's  REASON FOR ASSESSMENT:   Ventilator, Consult Enteral/tube feeding initiation and management  ASSESSMENT:   Patient with PMH significant for HTN, L TKR (2016), and Afib. Presents this admission with septic arthritis of L new and new onset AF with RVR.   12/16- s/p I&D total L knee  12/29- s/p total L knee revision/replacement  1/3- extubated   RD working remotely.  Unable to reach by phone. Diet advanced to regular consistency with thin liquids. Awaiting first meal. RD to provide supplementation to maximize kcal and protein this admission. Will try to obtain nutrition/weight history if possible.   Admission weight: 122.4 kg  Current weight: 103.4 kg   I/O: +3,977 ml since 12/22  UOP: 3,350 ml x 24 hrs   Medications: colace, folic acid, 40 mg lasix daily, SS novolog, MVI with minerals, thiamine  Labs: CBG 122-141    Diet Order:   Diet Order            Diet regular Room service appropriate? Yes with Assist; Fluid consistency: Thin  Diet effective now              EDUCATION NEEDS:   Not appropriate for education at this time  Skin:  Skin Assessment: Skin Integrity Issues: Skin Integrity Issues:: Incisions, Other (Comment) Incisions: L knee/L leg Other: MASD-buttocks, scrotum  Last BM:  1/4  Height:   Ht Readings from Last 1 Encounters:  07/13/19 6\' 3"  (1.905 m)     Weight:   Wt Readings from Last 1 Encounters:  08/02/19 103.4 kg    Ideal Body Weight:  89.1 kg  BMI:  Body mass index is 28.49 kg/m.  Estimated Nutritional Needs:   Kcal:  2300-2500 kcal  Protein:  115-130 grams  Fluid:  >/= 2.3 L/day   Mariana Single RD, LDN Clinical Nutrition Pager # - [phone]

## 2019-08-02 NOTE — Transfer of Care (Signed)
Immediate Anesthesia Transfer of Care Note  Patient: Vernon Frye  Procedure(s) Performed: TRANSESOPHAGEAL ECHOCARDIOGRAM (TEE) (N/A ) CARDIOVERSION (N/A )  Patient Location: Endoscopy Unit  Anesthesia Type:MAC  Level of Consciousness: drowsy and patient cooperative  Airway & Oxygen Therapy: Patient Spontanous Breathing  Post-op Assessment: Report given to RN and Post -op Vital signs reviewed and stable  Post vital signs: Reviewed and stable  Last Vitals:  Vitals Value Taken Time  BP 99/53 08/02/19 1600  Temp    Pulse 89 08/02/19 1601  Resp 23 08/02/19 1601  SpO2 96 % 08/02/19 1601  Vitals shown include unvalidated device data.  Last Pain:  Vitals:   08/02/19 1458  TempSrc: Temporal  PainSc: 8       Patients Stated Pain Goal: 0 (96/22/29 7989)  Complications: No apparent anesthesia complications

## 2019-08-02 NOTE — Evaluation (Signed)
Clinical/Bedside Swallow Evaluation Patient Details  Name: Vernon Frye MRN: [id number] Date of Birth: 08/21/1960  Today's Date: 08/02/2019 Time: SLP Start Time (ACUTE ONLY): [id number] SLP Stop Time (ACUTE ONLY): 0934 SLP Time Calculation (min) (ACUTE ONLY): 16 min  Past Medical History:  Past Medical History:  Diagnosis Date  . Anxiety    takes Xanax daily  . Arthritis   . Chronic back pain    DDD  . Complication of anesthesia    agitated when awaking   . H/O rotator cuff tear   . History of kidney stones   . Hypertension    takes Toprol,Prinizide,and Amlodipine daily  . Joint pain   . Joint swelling   . Laceration    history of to right lower extermity   . Night muscle spasms    takes Soma daily  . Numbness and tingling    hands and feet   . Pneumonia    history of   . Tinnitus    Past Surgical History:  Past Surgical History:  Procedure Laterality Date  . HERNIA REPAIR Bilateral as a baby   inguinal-  . I & D KNEE WITH POLY EXCHANGE Left 07/13/2019   Procedure: IRRIGATION AND DEBRIDEMENT TOTAL  KNEE WITH POLY EXCHANGE;  Surgeon: Paralee Cancel, MD;  Location: [facility];  Service: Orthopedics;  Laterality: Left;  . LITHOTRIPSY    . SHOULDER ARTHROSCOPY WITH SUBACROMIAL DECOMPRESSION AND OPEN ROTATOR C Left 01/16/2014   Procedure: LEFT SHOULDER ARTHROSCOPY WITH SUBACROMIAL DECOMPRESSION AND MINI OPEN ROTATOR CUFF REPAIR, OPEN DISTAL CLAVICLE RESECTION AND TENDODESIS;  Surgeon: Augustin Schooling, MD;  Location: [facility];  Service: Orthopedics;  Laterality: Left;  . tens unit     for severe pain   . TOTAL KNEE ARTHROPLASTY Left 06/19/2015   Procedure: LEFT TOTAL KNEE ARTHROPLASTY;  Surgeon: Paralee Cancel, MD;  Location: [facility];  Service: Orthopedics;  Laterality: Left;  . TOTAL KNEE REVISION Left 07/26/2019   Procedure: TOTAL KNEE REVISION removal of left total knee replacement with placement of antibiotic spacers;  Surgeon: Paralee Cancel, MD;  Location: [place];  Service: Orthopedics;   Laterality: Left;   HPI:  ST familiar with 59 year old admitted due to knee pain and severe deliruim found to have sepsis, MRSA, toxic metabolic encephalopathy. BSE 12/17 > NPO, progressed to Dys 1/nectar > Dys 3/thin (12/22). Then underwent L knee revision and replacement 12/29 and remained intubated until 07/31/19 and BSE ordered. PMH: pna, chronic back pain, HTN, joint pain.    Assessment / Plan / Recommendation Clinical Impression  Pt is more lucid and alert than prior swallow assessment several weeks ago. Vocal quality is hoarse but achieves strong cough. Coordinated swallow and respirations from subjective view with 3 oz water cup/straw. No s/s apiration present although increased risk factors include possible fluctuating mentation with pain meds. Educated pt to ensure he is upright, small bites sips, straws allowed and pills with thin. Will follow briefly for safety with regular and thin.  SLP Visit Diagnosis: Dysphagia, unspecified (R13.10)    Aspiration Risk  Mild aspiration risk;Moderate aspiration risk    Diet Recommendation Regular;Thin liquid   Liquid Administration via: Cup;Straw Medication Administration: Whole meds with liquid Supervision: Patient able to self feed Compensations: Slow rate;Minimize environmental distractions;Small sips/bites;Lingual sweep for clearance of pocketing;Clear throat intermittently Postural Changes: Seated upright at 90 degrees    Other  Recommendations Oral Care Recommendations: Oral care BID   Follow up Recommendations None      Frequency and Duration  min 1 x/week  1 week       Prognosis Prognosis for Safe Diet Advancement: Good      Swallow Study   General HPI: ST familiar with 59 year old admitted due to knee pain and severe deliruim found to have sepsis, MRSA, toxic metabolic encephalopathy. BSE 12/17 > NPO, progressed to Dys 1/nectar > Dys 3/thin (12/22). Then underwent L knee revision and replacement 12/29 and remained intubated  until 07/31/19 and BSE ordered. PMH: pna, chronic back pain, HTN, joint pain.  Type of Study: Bedside Swallow Evaluation Previous Swallow Assessment: (see HPI) Diet Prior to this Study: NPO Temperature Spikes Noted: No Respiratory Status: Room air History of Recent Intubation: Yes Length of Intubations (days): (5-6 days) Date extubated: 07/31/19 Behavior/Cognition: Alert;Cooperative;Pleasant mood;Requires cueing Oral Cavity Assessment: Within Functional Limits Oral Care Completed by SLP: No Oral Cavity - Dentition: Missing dentition Vision: Functional for self-feeding Self-Feeding Abilities: Able to feed self Patient Positioning: Upright in bed Baseline Vocal Quality: Hoarse Volitional Cough: Strong Volitional Swallow: Able to elicit    Oral/Motor/Sensory Function Overall Oral Motor/Sensory Function: Within functional limits   Ice Chips Ice chips: Within functional limits   Thin Liquid Thin Liquid: Within functional limits Presentation: Cup;Straw Oral Phase Impairments: (none) Pharyngeal  Phase Impairments: (none)    Nectar Thick Nectar Thick Liquid: Not tested   Honey Thick Honey Thick Liquid: Not tested   Puree Puree: Within functional limits   Solid     Solid: Within functional limits      Houston Siren 08/02/2019,9:44 AM  Orbie Pyo Colvin Caroli.Ed Risk analyst [phone] Office [phone]

## 2019-08-02 NOTE — Progress Notes (Signed)
Progress Note  Patient Name: Vernon Frye Date of Encounter: 08/02/2019  Primary Cardiologist: Sinclair Grooms, MD   Subjective   Pt denies CP or dyspnea; continues with knee pain   Inpatient Medications    Scheduled Meds: . acetaminophen  650 mg Oral Q6H  . chlorhexidine  15 mL Mouth Rinse BID  . Chlorhexidine Gluconate Cloth  6 each Topical Daily  . diazepam  5 mg Oral BID  . docusate sodium  100 mg Oral BID  . feeding supplement (PRO-STAT SUGAR FREE 64)  60 mL Per Tube BID  . folic acid  1 mg Oral Daily  . furosemide  40 mg Intravenous BID  . gabapentin  300 mg Oral TID  . insulin aspart  0-6 Units Subcutaneous Q4H  . ketorolac  30 mg Intravenous Q6H  . lisinopril  10 mg Oral Daily  . mouth rinse  15 mL Mouth Rinse q12n4p  . metoprolol tartrate  50 mg Oral Q6H  . morphine  15 mg Oral Q12H  . multivitamin with minerals  1 tablet Oral Daily  . pantoprazole (PROTONIX) IV  40 mg Intravenous Q24H  . QUEtiapine  25 mg Oral BID  . sodium chloride flush  10-40 mL Intracatheter Q12H  . thiamine  100 mg Oral Daily   Or  . thiamine  100 mg Intravenous Daily   Continuous Infusions: . sodium chloride Stopped (08/01/19 1747)  . amiodarone 30 mg/hr (08/02/19 0700)  . dexmedetomidine (PRECEDEX) IV infusion Stopped (07/31/19 1211)  . feeding supplement (VITAL 1.5 CAL) Stopped (07/31/19 0800)  . heparin 2,800 Units/hr (08/02/19 0700)  . vancomycin Stopped (08/02/19 0622)   PRN Meds: docusate, haloperidol lactate, HYDROmorphone (DILAUDID) injection, LORazepam, menthol-cetylpyridinium **OR** phenol, ondansetron **OR** ondansetron (ZOFRAN) IV, oxyCODONE, Resource ThickenUp Clear, sodium chloride flush, sodium chloride flush   Vital Signs    Vitals:   08/02/19 0500 08/02/19 0513 08/02/19 0600 08/02/19 0700  BP: 136/76 136/79 (!) 146/74 136/77  Pulse: 98 98 99 86  Resp: 13  14 (!) 21  Temp:      TempSrc:      SpO2: 100%  95% (!) 87%  Weight: 103.4 kg     Height:         Intake/Output Summary (Last 24 hours) at 08/02/2019 0721 Last data filed at 08/02/2019 0700 Gross per 24 hour  Intake 2118.41 ml  Output 3350 ml  Net -1231.59 ml   Last 3 Weights 08/02/2019 08/01/2019 07/31/2019  Weight (lbs) 227 lb 15.3 oz 234 lb 12.6 oz 231 lb 14.8 oz  Weight (kg) 103.4 kg 106.5 kg 105.2 kg      Telemetry    Atrial fibrillation, rate improving - Personally Reviewed  Physical Exam   GEN: No acute distress.  WD WN Neck: supple Cardiac: irregular and tachycardic; no murmur Respiratory: Diminished BS bases GI: Soft, nontender, non-distended, no masses MS: LLE; knee in wrap; 1+ edema; trace edema on right Neuro:  Grossly intact Psych: Normal affect   Labs    Chemistry Recent Labs  Lab 07/28/19 0959 07/31/19 0437 08/01/19 0307 08/02/19 0416  NA 144 142 140 137  K 3.6 3.3* 3.4* 3.7  CL 114* 108 106 104  CO2 22 24 25 24   GLUCOSE 166* 214* 154* 134*  BUN 18 21* 20 17  CREATININE 0.84 0.76 0.75 0.80  CALCIUM 8.0* 7.6* 7.8* 7.9*  PROT 5.8*  --   --   --   ALBUMIN 1.7*  --   --   --  AST 54*  --   --   --   ALT 76*  --   --   --   ALKPHOS 109  --   --   --   BILITOT 0.9  --   --   --   GFRNONAA >60 >60 >60 >60  GFRAA >60 >60 >60 >60  ANIONGAP 8 10 9 9      Hematology Recent Labs  Lab 07/31/19 0437 08/01/19 0307 08/02/19 0416  WBC 8.0 10.1 10.2  RBC 2.66* 3.02* 2.96*  HGB 7.6* 8.7* 8.6*  HCT 25.3* 28.2* 27.7*  MCV 95.1 93.4 93.6  MCH 28.6 28.8 29.1  MCHC 30.0 30.9 31.0  RDW 15.3 15.1 15.2  PLT 174 207 224    Patient Profile     Vernon Frye is a 59 y.o. male with admitted with sepsis 12/16, infected L knee (prosthetic). Course complicated by MRSA bacteremia and Afib with RVR. S/p removal of hardware and antibiotic spacer.   Echocardiogram shows normal LV function, trace mitral and tricuspid regurgitation.  Assessment & Plan    1 atrial fibrillation with rapid ventricular response-in reviewing records patient has been in atrial  fibrillation since at least February.  He has now received amiodarone load and will change to 200 mg daily.  Continue metoprolol.  Patient is to undergo TEE today to rule out vegetation in setting of bacteremia.  We will try and proceed with cardioversion assuming there is no left atrial appendage thrombus.  If he does not hold sinus rhythm best option will be rate control and anticoagulation.  Continue heparin at present dose.  Will transition to apixaban following cardioversion.  2 MRSA bacteremia-felt secondary to infected left knee prosthesis.  Plan transesophageal echocardiogram to rule out vegetation today.  3 infected prosthetic left knee-continue vancomycin.  4 hypertension-blood pressure mildly elevated.  Continue present medications.  Metoprolol increased yesterday.  Adjust regimen as needed.  For questions or updates, please contact [place] Please consult [email or web address] for contact info under        Signed, Kirk Ruths, MD  08/02/2019, 7:21 AM

## 2019-08-02 NOTE — Progress Notes (Signed)
[place] for Heparin  Indication: atrial fibrillation  Allergies  Allergen Reactions  . Vancomycin Shortness Of Breath and Rash    [name]     Patient Measurements: Height: 6\' 3"  (190.5 cm) Weight: 227 lb 15.3 oz (103.4 kg) IBW/kg (Calculated) : 84.5  Vital Signs: Temp: 97.8 F (36.6 C) (01/05 0400) Temp Source: Axillary (01/05 0400) BP: 136/77 (01/05 0700) Pulse Rate: 86 (01/05 0700)  Labs: Recent Labs    07/31/19 0437 08/01/19 0307 08/01/19 1609 08/02/19 0416  HGB 7.6* 8.7*  --  8.6*  HCT 25.3* 28.2*  --  27.7*  PLT 174 207  --  224  LABPROT  --   --  15.6*  --   INR  --   --  1.3*  --   HEPARINUNFRC 0.38 0.32  --  0.36  CREATININE 0.76 0.75  --  0.80    Estimated Creatinine Clearance: 131.1 mL/min (by C-G formula based on SCr of 0.8 mg/dL).   Medical History: Past Medical History:  Diagnosis Date  . Anxiety    takes Xanax daily  . Arthritis   . Chronic back pain    DDD  . Complication of anesthesia    agitated when awaking   . H/O rotator cuff tear   . History of kidney stones   . Hypertension    takes Toprol,Prinizide,and Amlodipine daily  . Joint pain   . Joint swelling   . Laceration    history of to right lower extermity   . Night muscle spasms    takes Soma daily  . Numbness and tingling    hands and feet   . Pneumonia    history of   . Tinnitus     Assessment: Anti-coagulation for afib held earlier this admission for surgical site bleeding, Hgb low but stable for a few days, heparin was restarted 1/1 with close monitoring of H/H and aiming for lower heparin level goal.  Heparin level is therapeutic at 0.36, H/H and pltc wnl.  Goal of Therapy:  Heparin level 0.3-0.5 units/ml, recent bleeding Monitor platelets by anticoagulation protocol: Yes   Plan:  Continue heparin at 2800 units/hr Daily heparin level and CBC   Arrie Senate, PharmD, BCPS Clinical Pharmacist [phone] Please  check AMION for all [facility] Pharmacy numbers 08/02/2019

## 2019-08-02 NOTE — Plan of Care (Signed)
  Problem: Clinical Measurements: °Goal: Cardiovascular complication will be avoided °Outcome: Progressing °  °Problem: Coping: °Goal: Level of anxiety will decrease °Outcome: Progressing °  °Problem: Elimination: °Goal: Will not experience complications related to urinary retention °Outcome: Progressing °  °Problem: Safety: °Goal: Ability to remain free from injury will improve °Outcome: Progressing °  °

## 2019-08-02 NOTE — Progress Notes (Signed)
NAME:  Vernon Frye, Vernon Frye:  [id number], DOB:  [birth year], LOS: 30 ADMISSION DATE:  07/12/2019, CONSULTATION DATE:  07/13/19 REFERRING MD:  [place] [facility], CHIEF COMPLAINT:  L knee pain  Brief History   59 yo M PMH Afib, HTN, L TKR (2016) who presented to ED with CC L knee pain. 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   Underwent left knee revision and replacement with placement of antibiotic spacer 12/20, patient remained intubated post procedure.  Overnight of 01/01 patient seen with spike in temperature of 102.6, tachycardia, and hypertension with development of very loose mucousy stools.  Given extended use of IV antibiotics concern for C. difficile.    01/03: No acute events overnight, C.diff negative.    01/04: Overnight, A.fib with RVR for which started on metoprolol tartrate. Also c/o severe pain for which increased dilaudid in addition to fentanyl prn.    Past Medical History  A fib HTN Anxiety L TKR  L shoulder arthroscopy  DDD  Significant Hospital Events   12/16 presents to ED for L knee pain. Concern for septic joint. A fiv RVR. Developed SOB after vanc administration. PCCM consulted for admission Underwent left knee Irrigation and debridement, total knee with poly exchange     12/29: L knee revision and replacement, remains intubated post operatively   01/02: Overnight of 01/01 patient seen with spike in temperature of 102.6, tachycardia, and hypertension with development of very loose mucousy stools.  Given extended use of IV antibiotics concern for C. difficile.    Consults:  Ortho  Cardiology ID EP  Procedures:  ETT 12/29 >> 01/03  Significant Diagnostic Tests:  12/19 ECHO > LVEF 55-60%  12/15 L knee XR > prior L knee replacement. Moderate joint effusion, no body abnormality   12/16 Renal ultrasound > Mild left-sided hydronephrosis is noted.  12/21 MRI lumbar spine DJD, no neural  impingement, no discitis  12/24 UE venous: Right: No evidence of thrombosis in the subclavian. Left:No evidence of deep vein thrombosis in the upper extremity. Findings consistent with acute superficial vein thrombosis involving the left cephalic vein.  12/27 cth: No acute intracranial abnormality. Non contrast CT appearance of the brain within normal limits for age. 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  Micro Data:  12/16 SARS Cov2 > negative  12/16 BCx > positive Staph MRSA  12/16 Strep A > Negative  12/16 Flu A/B > neg 12/16 MRSA PCR > positive  12/16 Body culture left knee > Abundant gram positive cocci  12/18 blood: negative 01/02: C. Diff > negative  01/02 blood culture: negative 01/02 urine culture: klebsiella oxytoca   Antimicrobials:  12/16 vanc x1 12/16 ceftriaxone x1 12/16 Zosyn > 12/16 Daptomycin 12/17 >12/21 vanc 12/22 > 6 weeks planned thru 09/06/2019  Interim history/subjective:  Yesterday, pain regimen adjusted for significant bilateral lower extremity pain with improvement in symptoms. He was also noted to have HR in 130-140s yesterday for which metoprolol tartrate was titrated to 50mg  q6h with better rate control.  Overnight, patient with some left lower extremity pain improved with pain medication and patient able to rest.  This morning, patient is in good spirits and is aware of today's plans to proceed with TEE.   Objective   Blood pressure 136/77, pulse 86, temperature 97.8 F (36.6 C), temperature source Axillary, resp. rate (!) 21, height 6\' 3"  (1.905 m), weight 103.4 kg, SpO2 (!) 87 %.  Intake/Output Summary (Last 24 hours) at 08/02/2019 0735 Last data filed at 08/02/2019 0700 Gross per 24 hour  Intake 2118.41 ml  Output 3350 ml  Net -1231.59 ml   Filed Weights   07/31/19 0500 08/01/19 0500 08/02/19 0500  Weight: 105.2 kg 106.5 kg 103.4 kg    Examination: Physical Exam  Constitutional: He is  well-developed, well-nourished, and in no distress.  Cardiovascular: Normal rate and normal heart sounds. An irregular rhythm present.  No murmur heard. Pulmonary/Chest: Effort normal. He has rales.  Abdominal: Soft. Bowel sounds are normal. He exhibits no distension. There is no abdominal tenderness.  Musculoskeletal:        General: Edema present. Normal range of motion.     Comments: Generalized edema improving     Resolved Hospital Problem list   Acute kidney injury Post operative respiratory insufficiency  Acute encephalopathy secondary to agitated delirium   Assessment & Plan:  MRSA Bacteremia  -resolved bacteremia, on 6 weeks vanc per ID -repeat cx negative 12/18, and 1/2 negative  -MRI negative for spinal abscess or osteomyelitis.  -TTE 12/16 w/ EF 55-60% with LV regional wall motion abnormalities, global RV reduced systolic functon, degenerative mitral valve, trivial mitral and tricuspid valve regurgitation; no evidence of aortiv calve sclerosis or stenosis P:  TEE for evaluation of endocarditis today Continue IV vancomycin per ID   Afib with RVR -Non-adherent w eliquis outpatient (2/2 financial constraints), on metoprolol succinate 200mg  qd -currently rate controlled with amiodarone gtt and metoprolol 50mg  q6h P  Cardiology following, appreciate assistance Continue amiodarone drip, will consider transitioning to oral following procedure  Continue metoprolol 50mg  q6h Pain control with MS contin,  dilaudid and fentanyl prn IV heparin, will consider transitioning to Eliquis following procedure   Generalized edema:  - Likely secondary to critical illness and fluids. Has been receiving lasix 40mg  bid with improvement in edema. UOP ~3.3L in 24hrs. Renal function good.  P IV Lasix 40mg  daily Strict I/O   Klebsiella UTI:  - Pt is currently afebrile, without leukocytosis and hemodynamically stable, asymptomatic  P: Continue to monitor   Left Knee Septic joint -suspect  septic joint in setting of L TKR in 2016 -Underwent I&D with partial hardwear exchange 12/16 -Total knee revision with removal of left total knee replacement and insertion of antibiotic spacers 12/29 - Evaluated by OT yesterday, recommended for SNF P Management per Ortho PT/OT eval  Hx EtOH and substance use -Hx c/w prior heavy use -UDS pos for Amphetamines, BZD, Opioids (Home opioids for pain, BZD for anxiety) - Off precedex  P Continue thiamine, folate, and multivitamin Scheduled Valium and Seroquel As needed Haldol and fentanyl    Best practice:  Diet: liquid diet, advance as tolerated  Pain/Anxiety/Delirium protocol (if indicated): ms contin, fentanyl, dilaudid, haldol prn VAP protocol (if indicated): none DVT prophylaxis: SCD's  GI prophylaxis: PPI  Glucose control: monitor Mobility: OOB to chair as tolerated  Code Status: Full  Family Communication: will update family  Disposition: ICU  Labs   CBC: Recent Labs  Lab 07/29/19 0243 07/30/19 1043 07/31/19 0437 08/01/19 0307 08/02/19 0416  WBC 7.3 9.9 8.0 10.1 10.2  HGB 8.2* 8.4* 7.6* 8.7* 8.6*  HCT 26.4* 27.3* 25.3* 28.2* 27.7*  MCV 93.3 93.5 95.1 93.4 93.6  PLT 187 201 174 207 [id number]    Basic Metabolic Panel: Recent Labs  Lab 07/28/19 0522 07/28/19 0959 07/29/19 0243 07/29/19 1700 07/30/19 0506 07/31/19 0437 08/01/19 0307 08/02/19 0416  NA 142 144 143 142 139  142 140 137  K 3.3* 3.6 3.6 3.3* 3.3* 3.3* 3.4* 3.7  CL 110 114* 114* 107 105 108 106 104  CO2 23 22 21* 25 22 24 25 24   GLUCOSE 194* 166* 191* 146* 200* 214* 154* 134*  BUN 18 18 18 17  21* 21* 20 17  CREATININE 0.82 0.84 0.79 0.84 0.93 0.76 0.75 0.80  CALCIUM 7.9* 8.0* 7.7* 7.5* 7.4* 7.6* 7.8* 7.9*  MG 1.7 1.7 1.7 1.6* 1.5*  --   --  2.0  PHOS 3.1 3.1 2.7  --   --   --   --   --    GFR: Estimated Creatinine Clearance: 131.1 mL/min (by C-G formula based on SCr of 0.8 mg/dL). Recent Labs  Lab 07/30/19 1043 07/31/19 0437 08/01/19 0307  08/02/19 0416  WBC 9.9 8.0 10.1 10.2    Liver Function Tests: Recent Labs  Lab 07/28/19 0959  AST 54*  ALT 76*  ALKPHOS 109  BILITOT 0.9  PROT 5.8*  ALBUMIN 1.7*   No results for input(s): LIPASE, AMYLASE in the last 168 hours. No results for input(s): AMMONIA in the last 168 hours.  ABG    Component Value Date/Time   PHART 7.404 07/26/2019 1719   PCO2ART 33.8 07/26/2019 1719   PO2ART 180.0 (H) 07/26/2019 1719   HCO3 21.2 07/26/2019 1719   TCO2 22 07/26/2019 1719   ACIDBASEDEF 3.0 (H) 07/26/2019 1719   O2SAT 100.0 07/26/2019 1719     Coagulation Profile: Recent Labs  Lab 08/01/19 1609  INR 1.3*    Cardiac Enzymes: No results for input(s): CKTOTAL, CKMB, CKMBINDEX, TROPONINI in the last 168 hours.  HbA1C: Hgb A1c MFr Bld  Date/Time Value Ref Range Status  07/27/2019 03:56 AM 5.2 4.8 - 5.6 % Final    Comment:    (NOTE) Pre diabetes:          5.7%-6.4% Diabetes:              >6.4% Glycemic control for   <7.0% adults with diabetes   07/13/2019 04:08 PM 6.3 (H) 4.8 - 5.6 % Final    Comment:    (NOTE) Pre diabetes:          5.7%-6.4% Diabetes:              >6.4% Glycemic control for   <7.0% adults with diabetes     CBG: Recent Labs  Lab 08/01/19 0746 08/01/19 1124 08/01/19 2011 08/02/19 0007 08/02/19 0413  GLUCAP 121* 138* 141* 131* 123*     Critical care time:   CRITICAL CARE Performed by: Harvie Heck Internal Medicine, PGY-1 08/02/2019 7:35 AM  Total critical care time: 30 minutes  Critical care time was exclusive of separately billable procedures and treating other patients.  Critical care was necessary to treat or prevent imminent or life-threatening deterioration.  Critical care was time spent personally by me on the following activities: development of treatment plan with patient and/or surrogate as well as nursing, discussions with consultants, evaluation of patient's response to treatment, examination of patient, obtaining history  from patient or surrogate, ordering and performing treatments and interventions, ordering and review of laboratory studies, ordering and review of radiographic studies, pulse oximetry and re-evaluation of patient's condition.

## 2019-08-02 NOTE — CV Procedure (Signed)
TRANSESOPHAGEAL ECHOCARDIOGRAM (TEE) NOTE  INDICATIONS: infective endocarditis, afib with RVR  PROCEDURE:   Informed consent was obtained prior to the procedure. The risks, benefits and alternatives for the procedure were discussed and the patient comprehended these risks.  Risks include, but are not limited to, cough, sore throat, vomiting, nausea, somnolence, esophageal and stomach trauma or perforation, bleeding, low blood pressure, aspiration, pneumonia, infection, trauma to the teeth and death.    After a procedural time-out, the patient was given propofol per anesthesia for sedation.  The patient's heart rate, blood pressure, and oxygen saturation are monitored continuously during the procedure. The transesophageal probe was inserted in the esophagus and stomach without difficulty and multiple views were obtained.  The patient was kept under observation until the patient left the procedure room.  I was present face-to-face 100% of this time. The patient left the procedure room in stable condition.   Agitated microbubble saline contrast was not administered.  COMPLICATIONS:    There were no immediate complications.  Findings:  1. LEFT VENTRICLE: The left ventricular wall thickness is mildly increased.  The left ventricular cavity is normal in size. Wall motion is normal.  LVEF is 60-65%.  2. RIGHT VENTRICLE:  The right ventricle is normal in structure and function without any thrombus or masses.    3. LEFT ATRIUM:  The left atrium is normal in size without any thrombus or masses.  There is not spontaneous echo contrast ("smoke") in the left atrium consistent with a low flow state.  4. LEFT ATRIAL APPENDAGE:  The left atrial appendage is free of any thrombus or masses. The appendage has single lobes. Pulse doppler indicates moderate flow in the appendage.  5. ATRIAL SEPTUM:  The atrial septum appears intact and is free of thrombus and/or masses.  There is no evidence for  interatrial shunting by color doppler and saline microbubble.  6. RIGHT ATRIUM:  The right atrium is mildly dilated in size and function without any thrombus or masses.  7. MITRAL VALVE:  The mitral valve is normal in structure and function with trivial regurgitation.  There were no vegetations or stenosis.  8. AORTIC VALVE:  The aortic valve is trileaflet, normal in structure and function with no regurgitation.  There were no vegetations or stenosis  9. TRICUSPID VALVE:  The tricuspid valve is normal in structure and function with trivial regurgitation.  There were no vegetations or stenosis  10.  PULMONIC VALVE:  The pulmonic valve is normal in structure and function with no regurgitation.  There were no vegetations or stenosis.   11. AORTIC ARCH, ASCENDING AND DESCENDING AORTA:  There was no Ron Parker et. Al, 1992) atherosclerosis of the ascending aorta, aortic arch, or proximal descending aorta.  12. PULMONARY VEINS: Anomalous pulmonary venous return was not noted.  13. PERICARDIUM: The pericardium appeared normal and non-thickened.  There is no pericardial effusion.  IMPRESSION:   1. No valvular vegetations. 2. No LAA thrombus 3. Negative for PFO 4. LVEF 60-65% 5. Successful DCCV.  RECOMMENDATIONS:    1. Management of bacteremia as per ID recs. 2. Successful DCCV initially to sinus, then accelerated junctional rhythm - stable.  Time Spent Directly with the Patient:  45 minutes   Pixie Casino, MD, [facility], [place] Director of the [facility]  Card[facility]ovascular [facility] Diplomate of the American Board of Clinical Lipidology Attending Cardiologist  Direct Dial: [phone]  Fax: [phone]  Website:  [email or web address]  Nadean Corwin Kao Berkheimer 08/02/2019, 4:01 PM

## 2019-08-03 DIAGNOSIS — R0902 Hypoxemia: Secondary | ICD-10-CM

## 2019-08-03 LAB — BASIC METABOLIC PANEL
Anion gap: 7 (ref 5–15)
BUN: 21 mg/dL — ABNORMAL HIGH (ref 6–20)
CO2: 26 mmol/L (ref 22–32)
Calcium: 8 mg/dL — ABNORMAL LOW (ref 8.9–10.3)
Chloride: 104 mmol/L (ref 98–111)
Creatinine, Ser: 0.81 mg/dL (ref 0.61–1.24)
GFR calc Af Amer: >60 mL/min (ref >60)
GFR calc non Af Amer: >60 mL/min (ref >60)
Glucose, Bld: 148 mg/dL — ABNORMAL HIGH (ref 70–99)
Potassium: 3.5 mmol/L (ref 3.5–5.1)
Sodium: 137 mmol/L (ref 135–145)

## 2019-08-03 LAB — CBC
HCT: 26.3 % — ABNORMAL LOW (ref 39.0–52.0)
Hemoglobin: 8.1 g/dL — ABNORMAL LOW (ref 13.0–17.0)
MCH: 29.1 pg (ref 26.0–34.0)
MCHC: 30.8 g/dL (ref 30.0–36.0)
MCV: 94.6 fL (ref 80.0–100.0)
Platelets: 246 10*3/uL (ref 150–400)
RBC: 2.78 MIL/uL — ABNORMAL LOW (ref 4.22–5.81)
RDW: 15.3 % (ref 11.5–15.5)
WBC: 9.4 10*3/uL (ref 4.0–10.5)
nRBC: 0 % (ref 0.0–0.2)

## 2019-08-03 LAB — GLUCOSE, CAPILLARY
Glucose-Capillary: 153 mg/dL — ABNORMAL HIGH (ref 70–99)
Glucose-Capillary: 140 mg/dL — ABNORMAL HIGH (ref 70–99)
Glucose-Capillary: 135 mg/dL — ABNORMAL HIGH (ref 70–99)
Glucose-Capillary: 138 mg/dL — ABNORMAL HIGH (ref 70–99)

## 2019-08-03 LAB — HEPARIN LEVEL (UNFRACTIONATED): Heparin Unfractionated: 0.42 IU/mL (ref 0.30–0.70)

## 2019-08-03 LAB — MAGNESIUM: Magnesium: 1.8 mg/dL (ref 1.7–2.4)

## 2019-08-03 MED ORDER — FUROSEMIDE 10 MG/ML IJ SOLN
40.0000 mg | Freq: Two times a day (BID) | INTRAMUSCULAR | Status: DC
  Administered 2019-08-03 – 2019-08-04 (×3): 40 mg via INTRAVENOUS
  Filled 2019-08-03 (×3): qty 4

## 2019-08-03 MED ORDER — APIXABAN 5 MG PO TABS
5.0000 mg | ORAL_TABLET | Freq: Two times a day (BID) | ORAL | Status: DC
  Administered 2019-08-03 – 2019-09-07 (×71): 5 mg via ORAL
  Filled 2019-08-03 (×71): qty 1

## 2019-08-03 NOTE — Progress Notes (Signed)
Inpatient Rehab Admissions:  Inpatient Rehab Consult received. Note therapy recommendations are for SNF.  Will sign off at this time.    Signed: Shann Medal, PT, DPT Admissions Coordinator [phone] 08/03/19  1:54 PM

## 2019-08-03 NOTE — Anesthesia Postprocedure Evaluation (Signed)
Anesthesia Post Note  Patient: Vernon Frye  Procedure(s) Performed: TRANSESOPHAGEAL ECHOCARDIOGRAM (TEE) (N/A ) CARDIOVERSION (N/A )     Patient location during evaluation: Endoscopy Anesthesia Type: MAC Level of consciousness: awake and alert Pain management: pain level controlled Vital Signs Assessment: post-procedure vital signs reviewed and stable Respiratory status: spontaneous breathing, nonlabored ventilation and respiratory function stable Cardiovascular status: stable and blood pressure returned to baseline Postop Assessment: no apparent nausea or vomiting Anesthetic complications: no    Last Vitals:  Vitals:   08/03/19 0500 08/03/19 0600  BP: 119/60 107/61  Pulse: 67 74  Resp: (!) 23 17  Temp:    SpO2: 98% 99%    Last Pain:  Vitals:   08/03/19 0330  TempSrc:   PainSc: 4    Pain Goal: Patients Stated Pain Goal: 0 (08/01/19 1437)                 Lynda Rainwater

## 2019-08-03 NOTE — Progress Notes (Signed)
NAME:  Vernon Frye, MRN:  [id number], DOB:  02/18/1961, LOS: 21 ADMISSION DATE:  07/12/2019, CONSULTATION DATE:  07/13/19 REFERRING MD:  [place] - [facility], CHIEF COMPLAINT:  L knee pain  Brief History   59 yo M PMH Afib, HTN, L TKR (2016) who presented to ED with CC L knee pain. 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   Underwent left knee revision and replacement with placement of antibiotic spacer 12/20, patient remained intubated post procedure.  Overnight of 01/01 patient seen with spike in temperature of 102.6, tachycardia, and hypertension with development of very loose mucousy stools.  Given extended use of IV antibiotics concern for C. difficile.    01/03: No acute events overnight, C.diff negative.    01/04: Overnight, A.fib with RVR for which started on metoprolol tartrate. Also c/o severe pain for which increased dilaudid in addition to fentanyl prn.    Past Medical History  A fib HTN Anxiety L TKR  L shoulder arthroscopy  DDD  Significant Hospital Events   12/16 presents to ED for L knee pain. Concern for septic joint. A fiv RVR. Developed SOB after vanc administration. PCCM consulted for admission Underwent left knee Irrigation and debridement, total knee with poly exchange     12/29: L knee revision and replacement, remains intubated post operatively   01/02: Overnight of 01/01 patient seen with spike in temperature of 102.6, tachycardia, and hypertension with development of very loose mucousy stools.  Given extended use of IV antibiotics concern for C. difficile.    Consults:  Ortho  Cardiology ID EP  Procedures:  ETT 12/29 >> 01/03  Significant Diagnostic Tests:  12/19 ECHO > LVEF 55-60%  12/15 L knee XR > prior L knee replacement. Moderate joint effusion, no body abnormality   12/16 Renal ultrasound > Mild left-sided hydronephrosis is noted.  12/21 MRI lumbar spine DJD, no neural  impingement, no discitis  12/24 UE venous: Right: No evidence of thrombosis in the subclavian. Left:No evidence of deep vein thrombosis in the upper extremity. Findings consistent with acute superficial vein thrombosis involving the left cephalic vein.  12/27 cth: No acute intracranial abnormality. Non contrast CT appearance of the brain within normal limits for age. 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  Micro Data:  12/16 SARS Cov2 > negative  12/16 BCx > positive Staph MRSA  12/16 Strep A > Negative  12/16 Flu A/B > neg 12/16 MRSA PCR > positive  12/16 Body culture left knee > Abundant gram positive cocci  12/18 blood: negative 01/02: C. Diff > negative  01/02 blood culture: negative 01/02 urine culture: klebsiella oxytoca   Antimicrobials:  12/16 vanc x1 12/16 ceftriaxone x1 12/16 Zosyn > 12/16 Daptomycin 12/17 >12/21 vanc 12/22 > 6 weeks planned thru 09/06/2019  Interim history/subjective:  Overnight, patient with continued left knee pain requiring pain meds. He notes increased left arm swelling and some paresthesias of his left heel. He is otherwise in good spirits and no acute distress.   Objective   Blood pressure 126/90, pulse 86, temperature (!) 97.1 F (36.2 C), temperature source Axillary, resp. rate (!) 21, height 6\' 3"  (1.905 m), weight 104.8 kg, SpO2 98 %.        Intake/Output Summary (Last 24 hours) at 08/03/2019 0716 Last data filed at 08/03/2019 0600 Gross per 24 hour  Intake 806.72 ml  Output 1451 ml  Net -644.28 ml   Danley Danker  Weights   08/01/19 0500 08/02/19 0500 08/03/19 0221  Weight: 106.5 kg 103.4 kg 104.8 kg    Examination: Physical Exam  Constitutional: He is oriented to person, place, and time and well-developed, well-nourished, and in no distress.  Cardiovascular: Normal rate, regular rhythm, normal heart sounds and intact distal pulses.  Pulmonary/Chest: Effort normal. He has rales.  Abdominal: Soft.  Bowel sounds are normal. He exhibits no distension. There is no abdominal tenderness.  Musculoskeletal:        General: Edema present. Normal range of motion.     Comments: 2+ pitting edema of left upper and lower extremity Motor 4/5 in bilateral lower extremities; sensation grossly intact   Neurological: He is alert and oriented to person, place, and time.  Skin: Skin is warm and dry.    Resolved Hospital Problem list   Acute kidney injury Post operative respiratory insufficiency  Acute encephalopathy secondary to agitated delirium   Assessment & Plan:  MRSA Bacteremia  -resolved bacteremia, on 6 weeks vanc per ID -repeat cx negative 12/18, and 1/2 negative  -MRI negative for spinal abscess or osteomyelitis.  -TTE 12/16 w/ EF 55-60% with LV regional wall motion abnormalities, global RV reduced systolic functon, degenerative mitral valve, trivial mitral and tricuspid valve regurgitation; no evidence of aortiv calve sclerosis or stenosis - TEE yesterday negative for valvular vegetations, atrial thrombus or PFO.  P:  Continue IV vancomycin x 6 wks per ID (end date 09/06/2019)  Afib with RVR: resolved  - History of medication noncompliance at home due to financial constraints  - Successful DCCV yesterday, currently in NSR - Transitioned off amiodarone gtt  P  Cardiology following, appreciate assistance Continue amiodarone 200mg  qd and lopressor 50mg  q6h Pain control with MS contin, dilaudid and fentanyl prn Started Eliquis 5mg  bid   Generalized edema:  - Patient with significant left sided pitting edema in setting of critical illness, fluids and dietary indiscretion.  - UOP ~1.4L in 24hrs on Lasix 40mg  daily  P IV Lasix 40mg  bid  Strict I/O   Klebsiella UTI:  - Pt is currently afebrile, without leukocytosis and hemodynamically stable, asymptomatic  P: Continue to monitor   Left Knee Septic joint -Underwent I&D with partial hardwear exchange 12/16 -Total knee revision with  removal of left total knee replacement and insertion of antibiotic spacers 12/29 - Patient with continued pain and paresthesias in left lower extremity this morning. Pedal pulses equal in bilateral lower extremities. Significant pitting edema but no calf tenderness noted.  P Management per Ortho PT/OT eval  Hx EtOH and substance use -Hx c/w prior heavy use -UDS pos for Amphetamines, BZD, Opioids (Home opioids for pain, BZD for anxiety) - Off precedex  P Continue thiamine, folate, and multivitamin Scheduled Valium and Seroquel As needed Haldol and fentanyl    Best practice:  Diet: Carb modified fluid restricted  Pain/Anxiety/Delirium protocol (if indicated): ms contin, dilaudid, oxycodone, seroquel, valium, haldol prn VAP protocol (if indicated): none DVT prophylaxis: SCD's  GI prophylaxis: PPI  Glucose control: monitor Mobility: OOB to chair as tolerated  Code Status: Full  Family Communication: family updated  Disposition: Transfer to [facility]   CBC: Recent Labs  Lab 07/30/19 1043 07/31/19 0437 08/01/19 0307 08/02/19 0416 08/03/19 0319  WBC 9.9 8.0 10.1 10.2 9.4  HGB 8.4* 7.6* 8.7* 8.6* 8.1*  HCT 27.3* 25.3* 28.2* 27.7* 26.3*  MCV 93.5 95.1 93.4 93.6 94.6  PLT 201 174 207 224 0000000    Basic Metabolic Panel: Recent Labs  Lab 07/28/19 0522 07/28/19 0959 07/29/19 0243 07/29/19 1700 07/30/19 0506 07/31/19 0437 08/01/19 0307 08/02/19 0416 08/03/19 0319  NA 142 144 143 142 139 142 140 137 137  K 3.3* 3.6 3.6 3.3* 3.3* 3.3* 3.4* 3.7 3.5  CL 110 114* 114* 107 105 108 106 104 104  CO2 23 22 21* 25 22 24 25 24 26   GLUCOSE 194* 166* 191* 146* 200* 214* 154* 134* 148*  BUN 18 18 18 17  21* 21* 20 17 21*  CREATININE 0.82 0.84 0.79 0.84 0.93 0.76 0.75 0.80 0.81  CALCIUM 7.9* 8.0* 7.7* 7.5* 7.4* 7.6* 7.8* 7.9* 8.0*  MG 1.7 1.7 1.7 1.6* 1.5*  --   --  2.0 1.8  PHOS 3.1 3.1 2.7  --   --   --   --   --   --    GFR: Estimated Creatinine Clearance: 130.2 mL/min (by  C-G formula based on SCr of 0.81 mg/dL). Recent Labs  Lab 07/31/19 0437 08/01/19 0307 08/02/19 0416 08/03/19 0319  WBC 8.0 10.1 10.2 9.4    Liver Function Tests: Recent Labs  Lab 07/28/19 0959  AST 54*  ALT 76*  ALKPHOS 109  BILITOT 0.9  PROT 5.8*  ALBUMIN 1.7*   No results for input(s): LIPASE, AMYLASE in the last 168 hours. No results for input(s): AMMONIA in the last 168 hours.  ABG    Component Value Date/Time   PHART 7.404 07/26/2019 1719   PCO2ART 33.8 07/26/2019 1719   PO2ART 180.0 (H) 07/26/2019 1719   HCO3 21.2 07/26/2019 1719   TCO2 22 07/26/2019 1719   ACIDBASEDEF 3.0 (H) 07/26/2019 1719   O2SAT 100.0 07/26/2019 1719     Coagulation Profile: Recent Labs  Lab 08/01/19 1609  INR 1.3*    Cardiac Enzymes: No results for input(s): CKTOTAL, CKMB, CKMBINDEX, TROPONINI in the last 168 hours.  HbA1C: Hgb A1c MFr Bld  Date/Time Value Ref Range Status  07/27/2019 03:56 AM 5.2 4.8 - 5.6 % Final    Comment:    (NOTE) Pre diabetes:          5.7%-6.4% Diabetes:              >6.4% Glycemic control for   <7.0% adults with diabetes   07/13/2019 04:08 PM 6.3 (H) 4.8 - 5.6 % Final    Comment:    (NOTE) Pre diabetes:          5.7%-6.4% Diabetes:              >6.4% Glycemic control for   <7.0% adults with diabetes     CBG: Recent Labs  Lab 08/02/19 0757 08/02/19 1129 08/02/19 1647 08/02/19 2041 08/02/19 2246  GLUCAP 122* 147* 108* 143* 140*     Critical care time:   CRITICAL CARE Performed by: Harvie Heck Internal Medicine, PGY-1 08/03/2019 7:16 AM  Total critical care time: 30 minutes  Critical care time was exclusive of separately billable procedures and treating other patients.  Critical care was necessary to treat or prevent imminent or life-threatening deterioration.  Critical care was time spent personally by me on the following activities: development of treatment plan with patient and/or surrogate as well as nursing, discussions  with consultants, evaluation of patient's response to treatment, examination of patient, obtaining history from patient or surrogate, ordering and performing treatments and interventions, ordering and review of laboratory studies, ordering and review of radiographic studies, pulse oximetry and re-evaluation of patient's condition.

## 2019-08-03 NOTE — Progress Notes (Signed)
Progress Note  Patient Name: Vernon Frye Date of Encounter: 08/03/2019  Primary Cardiologist: Sinclair Grooms, MD   Subjective   No CP or dyspnea   Inpatient Medications    Scheduled Meds: . acetaminophen  650 mg Oral Q6H  . amiodarone  200 mg Oral Daily  . chlorhexidine  15 mL Mouth Rinse BID  . Chlorhexidine Gluconate Cloth  6 each Topical Daily  . diazepam  5 mg Oral BID  . feeding supplement (ENSURE ENLIVE)  237 mL Oral BID BM  . folic acid  1 mg Oral Daily  . furosemide  40 mg Intravenous BID  . gabapentin  300 mg Oral TID  . insulin aspart  0-6 Units Subcutaneous Q4H  . lisinopril  10 mg Oral Daily  . mouth rinse  15 mL Mouth Rinse q12n4p  . metoprolol tartrate  50 mg Oral Q6H  . morphine  15 mg Oral Q12H  . multivitamin with minerals  1 tablet Oral Daily  . pantoprazole (PROTONIX) IV  40 mg Intravenous Q24H  . QUEtiapine  25 mg Oral BID  . sodium chloride flush  10-40 mL Intracatheter Q12H  . thiamine  100 mg Oral Daily   Or  . thiamine  100 mg Intravenous Daily   Continuous Infusions: . heparin 2,800 Units/hr (08/03/19 0649)  . vancomycin Stopped (08/03/19 0204)   PRN Meds: haloperidol lactate, HYDROmorphone (DILAUDID) injection, LORazepam, menthol-cetylpyridinium **OR** phenol, ondansetron **OR** ondansetron (ZOFRAN) IV, oxyCODONE, Resource ThickenUp Clear, sodium chloride flush, sodium chloride flush   Vital Signs    Vitals:   08/03/19 0430 08/03/19 0500 08/03/19 0600 08/03/19 0700  BP: 112/68 119/60 107/61 126/90  Pulse: 72 67 74 86  Resp: 16 (!) 23 17 (!) 21  Temp:      TempSrc:      SpO2: 100% 98% 99% 98%  Weight:      Height:        Intake/Output Summary (Last 24 hours) at 08/03/2019 0751 Last data filed at 08/03/2019 0600 Gross per 24 hour  Intake 806.72 ml  Output 1451 ml  Net -644.28 ml   Last 3 Weights 08/03/2019 08/02/2019 08/01/2019  Weight (lbs) 231 lb 0.7 oz 227 lb 15.3 oz 234 lb 12.6 oz  Weight (kg) 104.8 kg 103.4 kg 106.5 kg     Telemetry    NSR - Personally Reviewed  Physical Exam   GEN: WD WN Neck: No JVD Cardiac: RRR; no murmur Respiratory: CTA anteriorly GI: Soft, NT/ND MS: LLE; knee in wrap; 1+ edema LLE; trace edema on right Neuro:  No focal findings Psych: Normal affect   Labs    Chemistry Recent Labs  Lab 07/28/19 0959 08/01/19 0307 08/02/19 0416 08/03/19 0319  NA 144 140 137 137  K 3.6 3.4* 3.7 3.5  CL 114* 106 104 104  CO2 22 25 24 26   GLUCOSE 166* 154* 134* 148*  BUN 18 20 17  21*  CREATININE 0.84 0.75 0.80 0.81  CALCIUM 8.0* 7.8* 7.9* 8.0*  PROT 5.8*  --   --   --   ALBUMIN 1.7*  --   --   --   AST 54*  --   --   --   ALT 76*  --   --   --   ALKPHOS 109  --   --   --   BILITOT 0.9  --   --   --   GFRNONAA >60 >60 >60 >60  GFRAA >60 >60 >60 >60  ANIONGAP 8 9 9 7      Hematology Recent Labs  Lab 08/01/19 0307 08/02/19 0416 08/03/19 0319  WBC 10.1 10.2 9.4  RBC 3.02* 2.96* 2.78*  HGB 8.7* 8.6* 8.1*  HCT 28.2* 27.7* 26.3*  MCV 93.4 93.6 94.6  MCH 28.8 29.1 29.1  MCHC 30.9 31.0 30.8  RDW 15.1 15.2 15.3  PLT 207 224 246    Patient Profile     Vernon Frye is a 59 y.o. male with admitted with sepsis 12/16, infected L knee (prosthetic). Course complicated by MRSA bacteremia and Afib with RVR. S/p removal of hardware and antibiotic spacer.   Echocardiogram shows normal LV function, trace mitral and tricuspid regurgitation.  Assessment & Plan    1 atrial fibrillation with rapid ventricular response-back in sinus status post TEE guided cardioversion.  Continue amiodarone 200 mg daily.  Continue present dose of metoprolol.  He is on IV heparin.  Transition to apixaban 5 mg twice daily when okay with primary service.   2 MRSA bacteremia-felt secondary to infected left knee prosthesis.  TEE shows no vegetations.  3 infected prosthetic left knee-continue vancomycin.  4 hypertension-blood pressure controlled; continue present meds and follow.  For questions or  updates, please contact [facility] Please consult [email or web address] for contact info under        Signed, Kirk Ruths, MD  08/03/2019, 7:51 AM

## 2019-08-03 NOTE — Progress Notes (Signed)
  Speech Language Pathology Treatment: Dysphagia  Patient Details Name: Vernon Frye MRN: [id number] DOB: 1960-10-18 Today's Date: 08/03/2019 Time: 1025-8527 SLP Time Calculation (min) (ACUTE ONLY): 15 min  Assessment / Plan / Recommendation Clinical Impression  Follow-up after yesterday's swallow evaluation.  Pt sitting in recliner, quite talkative and animated. Voice/cough are strong.  Doing very well with current diet - masticated crackers, demonstrated brisk swallow, consumed 4 oz liquid with no s/s of aspiration.  Dysphagia resolved.  No further SLP f/u warranted - our service will sign off.   HPI HPI: ST familiar with 59 year old admitted due to knee pain and severe deliruim found to have sepsis, MRSA, toxic metabolic encephalopathy. BSE 12/17 > NPO, progressed to Dys 1/nectar > Dys 3/thin (12/22). Then underwent L knee revision and replacement 12/29 and remained intubated until 07/31/19 and BSE ordered. PMH: pna, chronic back pain, HTN, joint pain.       SLP Plan  All goals met       Recommendations  Diet recommendations: Regular;Thin liquid Liquids provided via: Cup;No straw Medication Administration: Whole meds with liquid Supervision: Patient able to self feed Postural Changes and/or Swallow Maneuvers: Seated upright 90 degrees                Oral Care Recommendations: Oral care BID Plan: All goals met       GO                Vernon Frye 08/03/2019, 11:15 AM  Estill Bamberg L. Tivis Ringer, [place] Office number [phone] Pager [phone]

## 2019-08-03 NOTE — Progress Notes (Signed)
eLink Physician-Brief Progress Note Patient Name: TITO PARKHOUSE DOB: 04-Aug-1960 MRN: [id number]   Date of Service  08/03/2019  HPI/Events of Note  Copious watery diarrhea - Patient currently on Colace.  eICU Interventions  Will order: 1. D/C Colace. 2. Place Flexiseal.     Intervention Category Major Interventions: Other:  Levina Boyack Cornelia Copa 08/03/2019, 1:28 AM

## 2019-08-03 NOTE — Progress Notes (Signed)
Called by CCM to take over care of patient starting tomorrow 08/04/19.  Patient admitted w/ L septic knee w enceph and underwent total knee replacement surgery. On IV abx thru 08/15/19. Postop had resp failure and was in ICU on vent 1-2 days.  Had TEE/ DCCV w/ good results for afib , off IV amio now. CIR consulted.   Kelly Splinter, MD   [facility] 08/03/2019, 1:06 PM

## 2019-08-03 NOTE — Progress Notes (Signed)
Physical Therapy Treatment Patient Details Name: Vernon Frye MRN: [id number] DOB: [birth year] Today's Date: 08/03/2019    History of Present Illness 59 yo M PMH Afib, HTN, L TKR (2016) who presented to ED with CC L knee pain. Pain began 12/14 and has progressed prompting presentation. 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 Desaturated with hypoxemic respiratory failure requiring 3LNC. Pt underwent L TKR with placement of antibiotic spacers on 12/29, agitated after procedure and unable to be extubated. Extubated 07/31/19.    PT Comments    Pt tolerated treatment well, follows PT commands well and is able to abide by WB precautions. Pt is generally weak in all extremities, limiting ability to perform transfers and functional mobility safely without +2 assistance. Pt requires verbal cues for transfer technique to abide by WB precautions. PT provides HEP to facilitate extremity strength. Pt will benefit from continued acute PT POC to reduce falls risk and improve OOB mobility.   Follow Up Recommendations  SNF;Supervision/Assistance - 24 hour     Equipment Recommendations  (defer to post-acute setting)    Recommendations for Other Services       Precautions / Restrictions Precautions Precautions: Fall;Knee Required Braces or Orthoses: Knee Immobilizer - Left Knee Immobilizer - Left: On when out of bed or walking Restrictions Weight Bearing Restrictions: Yes LLE Weight Bearing: Touchdown weight bearing Other Position/Activity Restrictions: No L knee ROM    Mobility  Bed Mobility Overal bed mobility: (pt received up in recliner and left in recliner)                Transfers Overall transfer level: Needs assistance Equipment used: Rolling walker (2 wheeled) Transfers: Sit to/from Stand Sit to Stand: Max assist;+2 physical assistance         General transfer comment: unable to complete a full stand  in 5 attempts. Pt clears buttocks on 4/5 attempts, unable to extend hips and trunk due to increased WB through LLE and general weakness  Ambulation/Gait                 Stairs             Wheelchair Mobility    Modified Rankin (Stroke Patients Only)       Balance Overall balance assessment: Needs assistance Sitting-balance support: Bilateral upper extremity supported;Feet supported Sitting balance-Leahy Scale: Fair Sitting balance - Comments: minG without back support in recliner   Standing balance support: (unable to achieve standing)                                Cognition Arousal/Alertness: Awake/alert Behavior During Therapy: WFL for tasks assessed/performed Overall Cognitive Status: No family/caregiver present to determine baseline cognitive functioning                                 General Comments: pt follows commands consistently and communicates appriorately with PT      Exercises Total Joint Exercises Ankle Circles/Pumps: AROM;Right;10 reps Gluteal Sets: AROM;Both;10 reps Straight Leg Raises: AROM;Right;10 reps Other Exercises Other Exercises: Push-ups in recliner with BUE support of arm rests    General Comments General comments (skin integrity, edema, etc.): VSS, pt on 3L [place]      Pertinent Vitals/Pain Pain Assessment: 0-10 Pain Score: 6  Pain Location: L knee Pain Descriptors /  Indicators: Aching Pain Intervention(s): Limited activity within patient's tolerance    Home Living                      Prior Function            PT Goals (current goals can now be found in the care plan section) Acute Rehab PT Goals Patient Stated Goal: get stronger Progress towards PT goals: Progressing toward goals    Frequency    Min 3X/week      PT Plan Current plan remains appropriate    Co-evaluation              AM-PAC PT "6 Clicks" Mobility   Outcome Measure  Help needed turning from your  back to your side while in a flat bed without using bedrails?: A Lot Help needed moving from lying on your back to sitting on the side of a flat bed without using bedrails?: Total Help needed moving to and from a bed to a chair (including a wheelchair)?: Total Help needed standing up from a chair using your arms (e.g., wheelchair or bedside chair)?: Total Help needed to walk in hospital room?: Total Help needed climbing 3-5 steps with a railing? : Total 6 Click Score: 7    End of Session Equipment Utilized During Treatment: Left knee immobilizer Activity Tolerance: Patient tolerated treatment well Patient left: in chair;with call bell/phone within reach Nurse Communication: Mobility status;Need for lift equipment PT Visit Diagnosis: Other abnormalities of gait and mobility (R26.89);Pain Pain - Right/Left: Left Pain - part of body: Knee     Time: 1122-1201 PT Time Calculation (min) (ACUTE ONLY): 39 min  Charges:  $Therapeutic Exercise: 8-22 mins $Therapeutic Activity: 23-37 mins                     Zenaida Niece, PT, DPT Acute Rehabilitation Pager: [phone]    Zenaida Niece 08/03/2019, 1:47 PM

## 2019-08-03 NOTE — Progress Notes (Signed)
Pharmacy Antibiotic Note  ASAUN BRESTER is a 59 y.o. male admitted on 07/12/2019 with MRSA bacteremia and prosthetic joint infection. Pharmacy has been consulted for vancomycin dosing. Pt on extended infusion time (4hrs) to alleviate Red Man Syndrome; no adverse effects reported since this was begun.  TEE negative for vegetations on 1/5, Cr stable, OPAT orders placed.  Plan: -Continue vancomycin 1250mg  IV q12h  -Monitor clinical progress, c/s, renal function -Vancomycin levels at least weekly -ID planning vancomycin continuing through 09/06/19   Height: 6\' 3"  (190.5 cm) Weight: 231 lb 0.7 oz (104.8 kg) IBW/kg (Calculated) : 84.5  Temp (24hrs), Avg:97.8 F (36.6 C), Min:97.1 F (36.2 C), Max:98.5 F (36.9 C)  Recent Labs  Lab 07/28/19 0522 07/28/19 0959 07/28/19 1412 07/30/19 0506 07/30/19 1043 07/31/19 0437 07/31/19 1044 07/31/19 1428 08/01/19 0307 08/02/19 0416 08/03/19 0319  WBC 8.2 8.2  --   --  9.9 8.0  --   --  10.1 10.2 9.4  CREATININE 0.82 0.84  --  0.93  --  0.76  --   --  0.75 0.80 0.81  VANCOTROUGH  --   --  13*  --   --   --   --  15  --   --   --   VANCOPEAK 45* 20*  --   --   --   --   --   --   --   --   --   VANCORANDOM  --   --   --   --   --   --  19  --   --   --   --     Estimated Creatinine Clearance: 130.2 mL/min (by C-G formula based on SCr of 0.81 mg/dL).    Allergies  Allergen Reactions  . Vancomycin Shortness Of Breath and Rash    [name]    Antimicrobials: Vanc 12/22>>(09/06/19) **Vanc 12/16 x1 - only received 1/2 dose d/t red man's syndrome, MD said d/c, then resume vanc Ceftriaxone 12/16 x1 Zosyn 12/16x1 Dapto 12/16>>12/22 - CK qWed - 126 (BL, 12/17)  12/25: VT 11, AUC 356, switched to vanc IV 1500 Q12 (over 4h d/t Red man, AUC 530) 12/28 VP = 28; trough 17 (giving over 4 hrs- but can use for kinetics)- AUC on 1500 q 12 = 591 - reduce to 1250 q 12 for AUC 492 12/31 VP 20, VT 13 - AUC 456 (giving over 4hrs) - cont 1250  q12h 1/3 VR 19, VT 15 - AUC 455 (giving over 4 hrs) - cont 1250 q12h  Microbiology: 12/16 BCx 2/2 mrsa 12/16L knee fluid: abundant MRSA 12/16 Ucx: neg 12/16 Grp A strep - neg 12/17 BCx >>1/2 SA 12/18 BCx >> ngF 1/2 Cdiff >> neg 1/2 UCx >>  >100k GNR 1/2 BCx >> ngtd  Arrie Senate, PharmD, BCPS Clinical Pharmacist [phone] Please check AMION for all [place] numbers 08/03/2019

## 2019-08-04 LAB — CBC
HCT: 29.2 % — ABNORMAL LOW (ref 39.0–52.0)
Hemoglobin: 9 g/dL — ABNORMAL LOW (ref 13.0–17.0)
MCH: 28.6 pg (ref 26.0–34.0)
MCHC: 30.8 g/dL (ref 30.0–36.0)
MCV: 92.7 fL (ref 80.0–100.0)
Platelets: 297 10*3/uL (ref 150–400)
RBC: 3.15 MIL/uL — ABNORMAL LOW (ref 4.22–5.81)
RDW: 15.3 % (ref 11.5–15.5)
WBC: 9.8 10*3/uL (ref 4.0–10.5)
nRBC: 0 % (ref 0.0–0.2)

## 2019-08-04 LAB — CULTURE, BLOOD (ROUTINE X 2)
Culture: NO GROWTH
Culture: NO GROWTH
Special Requests: ADEQUATE

## 2019-08-04 LAB — GLUCOSE, CAPILLARY
Glucose-Capillary: 152 mg/dL — ABNORMAL HIGH (ref 70–99)
Glucose-Capillary: 163 mg/dL — ABNORMAL HIGH (ref 70–99)
Glucose-Capillary: 129 mg/dL — ABNORMAL HIGH (ref 70–99)
Glucose-Capillary: 166 mg/dL — ABNORMAL HIGH (ref 70–99)

## 2019-08-04 LAB — MAGNESIUM: Magnesium: 1.6 mg/dL — ABNORMAL LOW (ref 1.7–2.4)

## 2019-08-04 LAB — BASIC METABOLIC PANEL
Anion gap: 9 (ref 5–15)
BUN: 17 mg/dL (ref 6–20)
CO2: 27 mmol/L (ref 22–32)
Calcium: 8.4 mg/dL — ABNORMAL LOW (ref 8.9–10.3)
Chloride: 104 mmol/L (ref 98–111)
Creatinine, Ser: 0.69 mg/dL (ref 0.61–1.24)
GFR calc Af Amer: >60 mL/min (ref >60)
GFR calc non Af Amer: >60 mL/min (ref >60)
Glucose, Bld: 124 mg/dL — ABNORMAL HIGH (ref 70–99)
Potassium: 3.6 mmol/L (ref 3.5–5.1)
Sodium: 140 mmol/L (ref 135–145)

## 2019-08-04 MED ORDER — PANTOPRAZOLE SODIUM 40 MG PO TBEC
40.0000 mg | DELAYED_RELEASE_TABLET | Freq: Every day | ORAL | Status: DC
  Administered 2019-08-05 – 2019-09-07 (×34): 40 mg via ORAL
  Filled 2019-08-04 (×34): qty 1

## 2019-08-04 MED ORDER — INSULIN ASPART 100 UNIT/ML ~~LOC~~ SOLN
0.0000 [IU] | Freq: Three times a day (TID) | SUBCUTANEOUS | Status: DC
  Administered 2019-08-04: 0 [IU] via SUBCUTANEOUS
  Administered 2019-08-04 – 2019-08-06 (×8): 1 [IU] via SUBCUTANEOUS
  Administered 2019-08-08: 2 [IU] via SUBCUTANEOUS
  Administered 2019-08-08 – 2019-08-11 (×8): 1 [IU] via SUBCUTANEOUS
  Administered 2019-08-11: 2 [IU] via SUBCUTANEOUS
  Administered 2019-08-11 – 2019-08-13 (×7): 1 [IU] via SUBCUTANEOUS
  Administered 2019-08-14: 2 [IU] via SUBCUTANEOUS
  Administered 2019-08-14 – 2019-09-02 (×24): 1 [IU] via SUBCUTANEOUS

## 2019-08-04 MED ORDER — MAGNESIUM OXIDE 400 (241.3 MG) MG PO TABS
400.0000 mg | ORAL_TABLET | Freq: Two times a day (BID) | ORAL | Status: DC
  Administered 2019-08-04 – 2019-09-07 (×69): 400 mg via ORAL
  Filled 2019-08-04 (×69): qty 1

## 2019-08-04 NOTE — Plan of Care (Signed)

## 2019-08-04 NOTE — Progress Notes (Signed)
[name] Kitchen  PROGRESS NOTE    Vernon Frye  [id number] DOB: [birth year] DOA: 07/12/2019 PCP: Nicholes Rough, PA-C   Brief Narrative:   59 yo M presenting with left knee swelling. Found to have septic arthritis of the knee. Seen by orthopedics for left total knee arthroplasty. Required intubation. Found to have new onset a fib RVR. Seen by cardiology. Had DCCV. Improving. Transferred to [facility] 08/03/19.   Assessment & Plan:   Principal Problem:   MRSA bacteremia Active Problems:   Septic joint ([place])   Atrial fibrillation with rapid ventricular response (HCC)   AKI (acute kidney injury) ([place])   Hyponatremia   Severe sepsis (HCC)   Severe sepsis with acute organ dysfunction (HCC)   Respiratory insufficiency   Acute pain of left knee   Prosthetic joint infection (HCC)   Tachypnea   Acute bilateral low back pain without sciatica   Left arm swelling   Red man syndrome   Acute metabolic encephalopathy   S/P revision of total knee   Malnutrition of moderate degree   Acute respiratory failure (HCC)  Acute Afib:     - seen by cards     - d/c DCCV     - eliquis, metoprolol, amiodarone  MRSA bacteremia L knee septic joint     - Bld Cx from 07/30/19 neg     - on vanc thru 1/18     - TEE without vegetations.      - S/p knee replacement     - Ortho signed off     - Cont PT. Will refer to inpt rehab per Dr Quincy Sheehan request.   Kleb uti:      - without tx     - was untreated and he was asymptomic  Etoh abuse Acute encephalopathy (toxic vs metabolic)      - stable; monitor  DVT prophylaxis: eliquis Code Status: FULL Family Communication: None at bedside   Disposition Plan: TBD  Consultants:   Cardiology  PCCM  Antimicrobials:  [name] Kitchen Vanc   ROS:  Denies CP, N, V, ab pain, dyspnea . Remainder 10-pt ROS is negative for all not previously mentioned.  Subjective: "This is crazy"  Objective: Vitals:   08/03/19 2200 08/03/19 2357 08/04/19 0510 08/04/19 1401  BP: 117/74 (!) 141/79  111/65 (!) 138/92  Pulse: 79 81 71 92  Resp:  18 17 18   Temp:  97.9 F (36.6 C) 97.7 F (36.5 C) 98.7 F (37.1 C)  TempSrc:  Axillary  Oral  SpO2: 100% 99% 95%   Weight:      Height:        Intake/Output Summary (Last 24 hours) at 08/04/2019 1602 Last data filed at 08/04/2019 1430 Gross per 24 hour  Intake 1307.89 ml  Output 4880 ml  Net -3572.11 ml   Filed Weights   08/01/19 0500 08/02/19 0500 08/03/19 0221  Weight: 106.5 kg 103.4 kg 104.8 kg    Examination:  General: 59 y.o. male resting in bed in NAD Cardiovascular: RRR, +S1, S2, no m/g/r, equal pulses throughout Respiratory: CTABL, no w/r/r, normal WOB GI: BS+, NDNT, no masses noted, no organomegaly noted MSK: No e/c/c, L boot Skin: No rashes, bruises, ulcerations noted Neuro: A&O x 3, no focal deficits Psyc: Appropriate interaction and affect, calm/cooperative   Data Reviewed: I have personally reviewed following labs and imaging studies.  CBC: Recent Labs  Lab 07/31/19 0437 08/01/19 0307 08/02/19 0416 08/03/19 0319 08/04/19 0305  WBC 8.0 10.1 10.2 9.4 9.8  HGB  7.6* 8.7* 8.6* 8.1* 9.0*  HCT 25.3* 28.2* 27.7* 26.3* 29.2*  MCV 95.1 93.4 93.6 94.6 92.7  PLT 174 207 224 246 [id number]   Basic Metabolic Panel: Recent Labs  Lab 07/29/19 0243 07/29/19 1700 07/30/19 0506 07/31/19 0437 08/01/19 0307 08/02/19 0416 08/03/19 0319 08/04/19 0305  NA 143 142 139 142 140 137 137 140  K 3.6 3.3* 3.3* 3.3* 3.4* 3.7 3.5 3.6  CL 114* 107 105 108 106 104 104 104  CO2 21* 25 22 24 25 24 26 27   GLUCOSE 191* 146* 200* 214* 154* 134* 148* 124*  BUN 18 17 21* 21* 20 17 21* 17  CREATININE 0.79 0.84 0.93 0.76 0.75 0.80 0.81 0.69  CALCIUM 7.7* 7.5* 7.4* 7.6* 7.8* 7.9* 8.0* 8.4*  MG 1.7 1.6* 1.5*  --   --  2.0 1.8 1.6*  PHOS 2.7  --   --   --   --   --   --   --    GFR: Estimated Creatinine Clearance: 131.8 mL/min (by C-G formula based on SCr of 0.69 mg/dL). Liver Function Tests: No results for input(s): AST, ALT, ALKPHOS,  BILITOT, PROT, ALBUMIN in the last 168 hours. No results for input(s): LIPASE, AMYLASE in the last 168 hours. No results for input(s): AMMONIA in the last 168 hours. Coagulation Profile: Recent Labs  Lab 08/01/19 1609  INR 1.3*   Cardiac Enzymes: No results for input(s): CKTOTAL, CKMB, CKMBINDEX, TROPONINI in the last 168 hours. BNP (last 3 results) No results for input(s): PROBNP in the last 8760 hours. HbA1C: No results for input(s): HGBA1C in the last 72 hours. CBG: Recent Labs  Lab 08/03/19 1110 08/03/19 1458 08/03/19 1952 08/04/19 0801 08/04/19 1153  GLUCAP 140* 135* 138* 152* 163*   Lipid Profile: No results for input(s): CHOL, HDL, LDLCALC, TRIG, CHOLHDL, LDLDIRECT in the last 72 hours. Thyroid Function Tests: No results for input(s): TSH, T4TOTAL, FREET4, T3FREE, THYROIDAB in the last 72 hours. Anemia Panel: No results for input(s): VITAMINB12, FOLATE, FERRITIN, TIBC, IRON, RETICCTPCT in the last 72 hours. Sepsis Labs: No results for input(s): PROCALCITON, LATICACIDVEN in the last 168 hours.  Recent Results (from the past 240 hour(s))  C difficile quick scan w PCR reflex     Status: None   Collection Time: 07/30/19 11:49 AM   Specimen: STOOL  Result Value Ref Range Status   C Diff antigen NEGATIVE NEGATIVE Final   C Diff toxin NEGATIVE NEGATIVE Final   C Diff interpretation No C. difficile detected.  Final    Comment: Performed at [facility], [place] [address], [place], [place] [address]  Culture, Urine     Status: Abnormal   Collection Time: 07/30/19 11:49 AM   Specimen: Urine, Random  Result Value Ref Range Status   Specimen Description URINE, RANDOM  Final   Special Requests   Final    NONE Performed at [facility], [place] [address], [place], [place] [address]    Culture >=100,000 COLONIES/mL KLEBSIELLA OXYTOCA (A)  Final   Report Status 08/01/2019 FINAL  Final   Organism ID, Bacteria KLEBSIELLA OXYTOCA (A)  Final      Susceptibility    Klebsiella oxytoca - MIC*    AMPICILLIN >=32 RESISTANT Resistant     CEFAZOLIN >=64 RESISTANT Resistant     CEFTRIAXONE <=0.25 SENSITIVE Sensitive     CIPROFLOXACIN <=0.25 SENSITIVE Sensitive     GENTAMICIN <=1 SENSITIVE Sensitive     IMIPENEM <=0.25 SENSITIVE Sensitive  NITROFURANTOIN 32 SENSITIVE Sensitive     TRIMETH/SULFA <=20 SENSITIVE Sensitive     AMPICILLIN/SULBACTAM 16 INTERMEDIATE Intermediate     PIP/TAZO <=4 SENSITIVE Sensitive     * >=100,000 COLONIES/mL KLEBSIELLA OXYTOCA  Culture, blood (routine x 2)     Status: None   Collection Time: 07/30/19 12:23 PM   Specimen: BLOOD  Result Value Ref Range Status   Specimen Description BLOOD LEFT ANTECUBITAL  Final   Special Requests   Final    BOTTLES DRAWN AEROBIC AND ANAEROBIC Blood Culture results may not be optimal due to an inadequate volume of blood received in culture bottles   Culture   Final    NO GROWTH 5 DAYS Performed at [facility], [place] [address] [place], [place] [address]    Report Status 08/04/2019 FINAL  Final  Culture, blood (routine x 2)     Status: None   Collection Time: 07/30/19 12:29 PM   Specimen: BLOOD LEFT HAND  Result Value Ref Range Status   Specimen Description BLOOD LEFT HAND  Final   Special Requests   Final    BOTTLES DRAWN AEROBIC ONLY Blood Culture adequate volume   Culture   Final    NO GROWTH 5 DAYS Performed at [facility], [place] [address], [place], [place] [address]    Report Status 08/04/2019 FINAL  Final      Radiology Studies: No results found.   Scheduled Meds: . acetaminophen  650 mg Oral Q6H  . amiodarone  200 mg Oral Daily  . apixaban  5 mg Oral BID  . chlorhexidine  15 mL Mouth Rinse BID  . Chlorhexidine Gluconate Cloth  6 each Topical Daily  . diazepam  5 mg Oral BID  . feeding supplement (ENSURE ENLIVE)  237 mL Oral BID BM  . folic acid  1 mg Oral Daily  . gabapentin  300 mg Oral TID  . insulin aspart  0-6 Units Subcutaneous TID AC & HS  .  lisinopril  10 mg Oral Daily  . magnesium oxide  400 mg Oral BID  . mouth rinse  15 mL Mouth Rinse q12n4p  . metoprolol tartrate  50 mg Oral Q6H  . morphine  15 mg Oral Q12H  . multivitamin with minerals  1 tablet Oral Daily  . pantoprazole (PROTONIX) IV  40 mg Intravenous Q24H  . QUEtiapine  25 mg Oral BID  . sodium chloride flush  10-40 mL Intracatheter Q12H  . thiamine  100 mg Oral Daily   Or  . thiamine  100 mg Intravenous Daily   Continuous Infusions: . vancomycin 1,250 mg (08/04/19 1430)     LOS: 22 days    Time spent: 25 minutes spent in the coordination of care today.    Jonnie Finner, DO [facility]  If 7PM-7AM, please contact night-coverage [email or web address] 08/04/2019, 4:02 PM

## 2019-08-04 NOTE — Discharge Instructions (Addendum)
Information on my medicine - ELIQUIS (apixaban)   Why was Eliquis prescribed for you? Eliquis was prescribed for you to reduce the risk of a blood clot forming that can cause a stroke if you have a medical condition called atrial fibrillation (a type of irregular heartbeat).  What do You need to know about Eliquis ? Take your Eliquis TWICE DAILY - one tablet in the morning and one tablet in the evening with or without food. If you have difficulty swallowing the tablet whole please discuss with your pharmacist how to take the medication safely.  Take Eliquis exactly as prescribed by your doctor and DO NOT stop taking Eliquis without talking to the doctor who prescribed the medication.  Stopping may increase your risk of developing a stroke.  Refill your prescription before you run out.  After discharge, you should have regular check-up appointments with your healthcare provider that is prescribing your Eliquis.  In the future your dose may need to be changed if your kidney function or weight changes by a significant amount or as you get older.  What do you do if you miss a dose? If you miss a dose, take it as soon as you remember on the same day and resume taking twice daily.  Do not take more than one dose of ELIQUIS at the same time to make up a missed dose.  Important Safety Information A possible side effect of Eliquis is bleeding. You should call your healthcare provider right away if you experience any of the following: Bleeding from an injury or your nose that does not stop. Unusual colored urine (red or dark brown) or unusual colored stools (red or black). Unusual bruising for unknown reasons. A serious fall or if you hit your head (even if there is no bleeding).  Some medicines may interact with Eliquis and might increase your risk of bleeding or clotting while on Eliquis. To help avoid this, consult your healthcare provider or pharmacist prior to using any new prescription  or non-prescription medications, including herbals, vitamins, non-steroidal anti-inflammatory drugs (NSAIDs) and supplements.  This website has more information on Eliquis (apixaban): http://www.eliquis.com/eliquis/home .  You were cared for by a hospitalist during your hospital stay. If you have any questions about your discharge medications or the care you received while you were in the hospital after you are discharged, you can call the unit and ask to speak with the hospitalist on call if the hospitalist that took care of you is not available. Once you are discharged, your primary care physician will handle any further medical issues. Please note that NO REFILLS for any discharge medications will be authorized once you are discharged, as it is imperative that you return to your primary care physician (or establish a relationship with a primary care physician if you do not have one) for your aftercare needs so that they can reassess your need for medications and monitor your lab values. 

## 2019-08-04 NOTE — Progress Notes (Signed)
Progress Note  Patient Name: Vernon Frye Date of Encounter: 08/04/2019  Primary Cardiologist: Sinclair Grooms, MD   Subjective   Pt denies CP or dyspnea; complains of knee pain   Inpatient Medications    Scheduled Meds: . acetaminophen  650 mg Oral Q6H  . amiodarone  200 mg Oral Daily  . apixaban  5 mg Oral BID  . chlorhexidine  15 mL Mouth Rinse BID  . Chlorhexidine Gluconate Cloth  6 each Topical Daily  . diazepam  5 mg Oral BID  . feeding supplement (ENSURE ENLIVE)  237 mL Oral BID BM  . folic acid  1 mg Oral Daily  . furosemide  40 mg Intravenous BID  . gabapentin  300 mg Oral TID  . insulin aspart  0-6 Units Subcutaneous TID AC & HS  . lisinopril  10 mg Oral Daily  . magnesium oxide  400 mg Oral BID  . mouth rinse  15 mL Mouth Rinse q12n4p  . metoprolol tartrate  50 mg Oral Q6H  . morphine  15 mg Oral Q12H  . multivitamin with minerals  1 tablet Oral Daily  . pantoprazole (PROTONIX) IV  40 mg Intravenous Q24H  . QUEtiapine  25 mg Oral BID  . sodium chloride flush  10-40 mL Intracatheter Q12H  . thiamine  100 mg Oral Daily   Or  . thiamine  100 mg Intravenous Daily   Continuous Infusions: . vancomycin 1,250 mg (08/04/19 0501)   PRN Meds: haloperidol lactate, HYDROmorphone (DILAUDID) injection, LORazepam, menthol-cetylpyridinium **OR** phenol, ondansetron **OR** ondansetron (ZOFRAN) IV, oxyCODONE, Resource ThickenUp Clear, sodium chloride flush, sodium chloride flush   Vital Signs    Vitals:   08/03/19 2100 08/03/19 2200 08/03/19 2357 08/04/19 0510  BP: 124/89 117/74 (!) 141/79 111/65  Pulse: 80 79 81 71  Resp:   18 17  Temp:   97.9 F (36.6 C) 97.7 F (36.5 C)  TempSrc:   Axillary   SpO2: 100% 100% 99% 95%  Weight:      Height:        Intake/Output Summary (Last 24 hours) at 08/04/2019 1213 Last data filed at 08/04/2019 1200 Gross per 24 hour  Intake 1007.89 ml  Output 4580 ml  Net -3572.11 ml   Last 3 Weights 08/03/2019 08/02/2019 08/01/2019    Weight (lbs) 231 lb 0.7 oz 227 lb 15.3 oz 234 lb 12.6 oz  Weight (kg) 104.8 kg 103.4 kg 106.5 kg      Telemetry    NSR - Personally Reviewed  Physical Exam   GEN: WD WN NAD Neck: supple Cardiac: RRR Respiratory: CTA; no wheeze GI: Soft, NT/ND, no masses MS: LLE; knee in wrap; 1+ edema LLE; trace edema on right Neuro:  Grossly intact Psych: Normal affect   Labs    Chemistry Recent Labs  Lab 08/02/19 0416 08/03/19 0319 08/04/19 0305  NA 137 137 140  K 3.7 3.5 3.6  CL 104 104 104  CO2 24 26 27   GLUCOSE 134* 148* 124*  BUN 17 21* 17  CREATININE 0.80 0.81 0.69  CALCIUM 7.9* 8.0* 8.4*  GFRNONAA >60 >60 >60  GFRAA >60 >60 >60  ANIONGAP 9 7 9      Hematology Recent Labs  Lab 08/02/19 0416 08/03/19 0319 08/04/19 0305  WBC 10.2 9.4 9.8  RBC 2.96* 2.78* 3.15*  HGB 8.6* 8.1* 9.0*  HCT 27.7* 26.3* 29.2*  MCV 93.6 94.6 92.7  MCH 29.1 29.1 28.6  MCHC 31.0 30.8 30.8  RDW  15.2 15.3 15.3  PLT 224 246 297    Patient Profile     Vernon Frye is a 59 y.o. male with admitted with sepsis 12/16, infected L knee (prosthetic). Course complicated by MRSA bacteremia and Afib with RVR. S/p removal of hardware and antibiotic spacer.   Echocardiogram shows normal LV function, trace mitral and tricuspid regurgitation.  Assessment & Plan    1 atrial fibrillation with rapid ventricular response-patient remains in sinus rhythm today.  Continue amiodarone and metoprolol at present dose.  Continue apixaban 5 mg twice daily.  2 MRSA bacteremia-felt secondary to infected left knee prosthesis.  TEE shows no vegetations.  3 infected prosthetic left knee-continue vancomycin.  4 hypertension-blood pressure controlled; continue present meds and follow.  5 volume excess-patient appears to be euvolemic.  Will discontinue Lasix.  [facility] will sign off.   Medication Recommendations:  Continue present cardiac meds Other recommendations (labs, testing, etc):  No additional  cardiac testing Follow up as an outpatient:  4-6 weeks following DC  With APP or Dr Tamala Julian  For questions or updates, please contact [place] Please consult [email or web address] for contact info under        Signed, Kirk Ruths, MD  08/04/2019, 12:13 PM

## 2019-08-05 LAB — BASIC METABOLIC PANEL
Anion gap: 10 (ref 5–15)
BUN: 16 mg/dL (ref 6–20)
CO2: 29 mmol/L (ref 22–32)
Calcium: 8.3 mg/dL — ABNORMAL LOW (ref 8.9–10.3)
Chloride: 98 mmol/L (ref 98–111)
Creatinine, Ser: 0.67 mg/dL (ref 0.61–1.24)
GFR calc Af Amer: >60 mL/min (ref >60)
GFR calc non Af Amer: >60 mL/min (ref >60)
Glucose, Bld: 148 mg/dL — ABNORMAL HIGH (ref 70–99)
Potassium: 3.7 mmol/L (ref 3.5–5.1)
Sodium: 137 mmol/L (ref 135–145)

## 2019-08-05 LAB — GLUCOSE, CAPILLARY
Glucose-Capillary: 122 mg/dL — ABNORMAL HIGH (ref 70–99)
Glucose-Capillary: 151 mg/dL — ABNORMAL HIGH (ref 70–99)
Glucose-Capillary: 145 mg/dL — ABNORMAL HIGH (ref 70–99)
Glucose-Capillary: 187 mg/dL — ABNORMAL HIGH (ref 70–99)

## 2019-08-05 LAB — CBC
HCT: 27.4 % — ABNORMAL LOW (ref 39.0–52.0)
Hemoglobin: 8.5 g/dL — ABNORMAL LOW (ref 13.0–17.0)
MCH: 28.9 pg (ref 26.0–34.0)
MCHC: 31 g/dL (ref 30.0–36.0)
MCV: 93.2 fL (ref 80.0–100.0)
Platelets: 330 10*3/uL (ref 150–400)
RBC: 2.94 MIL/uL — ABNORMAL LOW (ref 4.22–5.81)
RDW: 15.4 % (ref 11.5–15.5)
WBC: 11.5 10*3/uL — ABNORMAL HIGH (ref 4.0–10.5)
nRBC: 0 % (ref 0.0–0.2)

## 2019-08-05 LAB — MAGNESIUM: Magnesium: 1.6 mg/dL — ABNORMAL LOW (ref 1.7–2.4)

## 2019-08-05 MED ORDER — DOCUSATE SODIUM 100 MG PO CAPS
100.0000 mg | ORAL_CAPSULE | Freq: Two times a day (BID) | ORAL | Status: DC
  Administered 2019-08-05 – 2019-09-07 (×66): 100 mg via ORAL
  Filled 2019-08-05 (×67): qty 1

## 2019-08-05 MED ORDER — HYDROMORPHONE HCL 1 MG/ML IJ SOLN: 3.0000 mg | INTRAMUSCULAR | Status: DC | PRN

## 2019-08-05 MED ORDER — PSYLLIUM 95 % PO PACK
1.0000 | PACK | Freq: Every day | ORAL | Status: DC
  Administered 2019-08-05 – 2019-08-29 (×23): 1 via ORAL
  Filled 2019-08-05 (×34): qty 1

## 2019-08-05 NOTE — Progress Notes (Signed)
Vernon Frye  PROGRESS NOTE    DISHON GLYMPH  [id number] DOB: 23-Jan-1961 DOA: 07/12/2019 PCP: Nicholes Rough, PA-C   Brief Narrative:   59 yo M presenting with left knee swelling. Found to have septic arthritis of the knee. Seen by orthopedics for left total knee arthroplasty. Required intubation. Found to have new onset a fib RVR. Seen by cardiology. Had DCCV. Improving. Transferred to [facility] 08/03/19.  08/05/19: Let's stop IV pain meds. Can continue orals. BS are slow today. Says he's passing gas. Adding colace. Continue abx through 1/18. Hopefully can find SNF soon.     Assessment & Plan:   Principal Problem:   MRSA bacteremia Active Problems:   Septic joint ([place])   Atrial fibrillation with rapid ventricular response (HCC)   AKI (acute kidney injury) ([place])   Hyponatremia   Severe sepsis (HCC)   Severe sepsis with acute organ dysfunction (HCC)   Respiratory insufficiency   Acute pain of left knee   Prosthetic joint infection (HCC)   Tachypnea   Acute bilateral low back pain without sciatica   Left arm swelling   Red man syndrome   Acute metabolic encephalopathy   S/P revision of total knee   Malnutrition of moderate degree   Acute respiratory failure ([place])  Acute Afib:     - seen by cards     - d/c DCCV     - eliquis, metoprolol, amiodarone     - 08/05/19: HR good, denies palpitations  MRSA bacteremia L knee septic joint     - Bld Cx from 07/30/19 neg     - on vanc thru 1/18     - TEE without vegetations.      - S/p knee replacement     - Ortho signed off     - Cont PT. Will refer to inpt rehab per Dr Quincy Sheehan request.     - 08/05/19: PT recs SNF, working on options.  Kleb uti:      - without tx     - was untreated and he was asymptomic  Etoh abuse Acute encephalopathy (toxic vs metabolic)      - stable; monitor  DVT prophylaxis: eliquis Code Status: FULL Family Communication: None at bedside   Disposition Plan: TBD  Consultants:    Orthopedics  PCCM  Antimicrobials:  Vernon Frye Vanc   ROS:  Denies CP, N, V, dyspnea . Remainder 10-pt ROS is negative for all not previously mentioned.  Subjective: "I'm passing gas."  Objective: Vitals:   08/04/19 1401 08/04/19 2145 08/04/19 2203 08/05/19 0434  BP: (!) 138/92 135/77  121/65  Pulse: 92 81  74  Resp: 18 18  18   Temp: 98.7 F (37.1 C)  98.7 F (37.1 C) 98.5 F (36.9 C)  TempSrc: Oral  Oral Oral  SpO2:  96%  95%  Weight:    107.1 kg  Height:        Intake/Output Summary (Last 24 hours) at 08/05/2019 1349 Last data filed at 08/05/2019 1239 Gross per 24 hour  Intake 1380 ml  Output 2100 ml  Net -720 ml   Filed Weights   08/02/19 0500 08/03/19 0221 08/05/19 0434  Weight: 103.4 kg 104.8 kg 107.1 kg    Examination:  General: 59 y.o. male resting in chair in NAD Cardiovascular: RRR, +S1, S2, no m/g/r Respiratory: CTABL, no w/r/r GI: BS hypokinetic, NDNT, soft MSK: No e/c/c, LLE boot Skin: No rashes, bruises, ulcerations noted Neuro: A&O x 3, no focal deficits Psyc: Appropriate interaction  and affect, calm/cooperative   Data Reviewed: I have personally reviewed following labs and imaging studies.  CBC: Recent Labs  Lab 08/01/19 0307 08/02/19 0416 08/03/19 0319 08/04/19 0305 08/05/19 0408  WBC 10.1 10.2 9.4 9.8 11.5*  HGB 8.7* 8.6* 8.1* 9.0* 8.5*  HCT 28.2* 27.7* 26.3* 29.2* 27.4*  MCV 93.4 93.6 94.6 92.7 93.2  PLT 207 224 246 297 [id number]   Basic Metabolic Panel: Recent Labs  Lab 07/30/19 0506 08/01/19 0307 08/02/19 0416 08/03/19 0319 08/04/19 0305 08/05/19 0408  NA 139 140 137 137 140 137  K 3.3* 3.4* 3.7 3.5 3.6 3.7  CL 105 106 104 104 104 98  CO2 22 25 24 26 27 29   GLUCOSE 200* 154* 134* 148* 124* 148*  BUN 21* 20 17 21* 17 16  CREATININE 0.93 0.75 0.80 0.81 0.69 0.67  CALCIUM 7.4* 7.8* 7.9* 8.0* 8.4* 8.3*  MG 1.5*  --  2.0 1.8 1.6* 1.6*   GFR: Estimated Creatinine Clearance: 133.1 mL/min (by C-G formula based on SCr of 0.67  mg/dL). Liver Function Tests: No results for input(s): AST, ALT, ALKPHOS, BILITOT, PROT, ALBUMIN in the last 168 hours. No results for input(s): LIPASE, AMYLASE in the last 168 hours. No results for input(s): AMMONIA in the last 168 hours. Coagulation Profile: Recent Labs  Lab 08/01/19 1609  INR 1.3*   Cardiac Enzymes: No results for input(s): CKTOTAL, CKMB, CKMBINDEX, TROPONINI in the last 168 hours. BNP (last 3 results) No results for input(s): PROBNP in the last 8760 hours. HbA1C: No results for input(s): HGBA1C in the last 72 hours. CBG: Recent Labs  Lab 08/04/19 0801 08/04/19 1153 08/04/19 1637 08/04/19 2142 08/05/19 0807  GLUCAP 152* 163* 129* 166* 122*   Lipid Profile: No results for input(s): CHOL, HDL, LDLCALC, TRIG, CHOLHDL, LDLDIRECT in the last 72 hours. Thyroid Function Tests: No results for input(s): TSH, T4TOTAL, FREET4, T3FREE, THYROIDAB in the last 72 hours. Anemia Panel: No results for input(s): VITAMINB12, FOLATE, FERRITIN, TIBC, IRON, RETICCTPCT in the last 72 hours. Sepsis Labs: No results for input(s): PROCALCITON, LATICACIDVEN in the last 168 hours.  Recent Results (from the past 240 hour(s))  C difficile quick scan w PCR reflex     Status: None   Collection Time: 07/30/19 11:49 AM   Specimen: STOOL  Result Value Ref Range Status   C Diff antigen NEGATIVE NEGATIVE Final   C Diff toxin NEGATIVE NEGATIVE Final   C Diff interpretation No C. difficile detected.  Final    Comment: Performed at [facility], [place] [address] [place], [place] [address]  Culture, Urine     Status: Abnormal   Collection Time: 07/30/19 11:49 AM   Specimen: Urine, Random  Result Value Ref Range Status   Specimen Description URINE, RANDOM  Final   Special Requests   Final    NONE Performed at [facility], [place] [address] [place], [place] [address]    Culture >=100,000 COLONIES/mL KLEBSIELLA OXYTOCA (A)  Final   Report Status 08/01/2019 FINAL  Final    Organism ID, Bacteria KLEBSIELLA OXYTOCA (A)  Final      Susceptibility   Klebsiella oxytoca - MIC*    AMPICILLIN >=32 RESISTANT Resistant     CEFAZOLIN >=64 RESISTANT Resistant     CEFTRIAXONE <=0.25 SENSITIVE Sensitive     CIPROFLOXACIN <=0.25 SENSITIVE Sensitive     GENTAMICIN <=1 SENSITIVE Sensitive     IMIPENEM <=0.25 SENSITIVE Sensitive     NITROFURANTOIN 32 SENSITIVE Sensitive  TRIMETH/SULFA <=20 SENSITIVE Sensitive     AMPICILLIN/SULBACTAM 16 INTERMEDIATE Intermediate     PIP/TAZO <=4 SENSITIVE Sensitive     * >=100,000 COLONIES/mL KLEBSIELLA OXYTOCA  Culture, blood (routine x 2)     Status: None   Collection Time: 07/30/19 12:23 PM   Specimen: BLOOD  Result Value Ref Range Status   Specimen Description BLOOD LEFT ANTECUBITAL  Final   Special Requests   Final    BOTTLES DRAWN AEROBIC AND ANAEROBIC Blood Culture results may not be optimal due to an inadequate volume of blood received in culture bottles   Culture   Final    NO GROWTH 5 DAYS Performed at [facility], [place] [address], [place], [place] [address]    Report Status 08/04/2019 FINAL  Final  Culture, blood (routine x 2)     Status: None   Collection Time: 07/30/19 12:29 PM   Specimen: BLOOD LEFT HAND  Result Value Ref Range Status   Specimen Description BLOOD LEFT HAND  Final   Special Requests   Final    BOTTLES DRAWN AEROBIC ONLY Blood Culture adequate volume   Culture   Final    NO GROWTH 5 DAYS Performed at [facility], [place] [address] [place], [place] [address]    Report Status 08/04/2019 FINAL  Final      Radiology Studies: No results found.   Scheduled Meds: . acetaminophen  650 mg Oral Q6H  . amiodarone  200 mg Oral Daily  . apixaban  5 mg Oral BID  . chlorhexidine  15 mL Mouth Rinse BID  . Chlorhexidine Gluconate Cloth  6 each Topical Daily  . diazepam  5 mg Oral BID  . docusate sodium  100 mg Oral BID  . feeding supplement (ENSURE ENLIVE)  237 mL Oral BID BM  . folic acid   1 mg Oral Daily  . gabapentin  300 mg Oral TID  . insulin aspart  0-6 Units Subcutaneous TID AC & HS  . lisinopril  10 mg Oral Daily  . magnesium oxide  400 mg Oral BID  . mouth rinse  15 mL Mouth Rinse q12n4p  . metoprolol tartrate  50 mg Oral Q6H  . morphine  15 mg Oral Q12H  . multivitamin with minerals  1 tablet Oral Daily  . pantoprazole  40 mg Oral Daily  . psyllium  1 packet Oral Daily  . QUEtiapine  25 mg Oral BID  . sodium chloride flush  10-40 mL Intracatheter Q12H  . thiamine  100 mg Oral Daily   Continuous Infusions: . vancomycin 1,250 mg (08/05/19 0322)     LOS: 23 days    Time spent: 25 minutes spent in the coordination of care today.    Jonnie Finner, DO [facility]  If 7PM-7AM, please contact night-coverage [email or web address] 08/05/2019, 1:49 PM

## 2019-08-05 NOTE — Progress Notes (Signed)
Physical Therapy Treatment Patient Details Name: Vernon Frye MRN: [id number] DOB: Jan 16, 1961 Today's Date: 08/05/2019    History of Present Illness 59 yo M PMH Afib, HTN, L TKR (2016) who presented to ED with Lt knee pain. 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 Desaturated with hypoxemic respiratory failure requiring 3LNC. Pt underwent L TKA removal with placement of antibiotic spacers on 12/29, agitated after procedure and unable to be extubated. Extubated 07/31/19.    PT Comments    Pt very pleasant and willing to work toward increased mobility. Discussed with pt the benefit of focus on lateral transfers to achieve mobility at W/C level with continued attempts at standing but until able to maintain NWB on LLE other options of transfers are not needed. Pt educated for HEP and stretching LLE with belt.    Follow Up Recommendations  SNF;Supervision/Assistance - 24 hour     Equipment Recommendations  Wheelchair (measurements PT);Wheelchair cushion (measurements PT);Hospital bed;3in1 (PT)    Recommendations for Other Services       Precautions / Restrictions Precautions Precautions: Fall;Other (comment) Required Braces or Orthoses: Knee Immobilizer - Left Knee Immobilizer - Left: On at all times Restrictions LLE Weight Bearing: Non weight bearing Other Position/Activity Restrictions: No L knee ROM    Mobility  Bed Mobility Overal bed mobility: Needs Assistance Bed Mobility: Supine to Sit     Supine to sit: Min assist     General bed mobility comments: min assist to transition from [place] 30 degrees to long sitting with cues for hand placement and safety  Transfers Overall transfer level: Needs assistance              Lateral/Scoot Transfers: Min assist;+2 safety/equipment General transfer comment: pt able to slide toward right with drop arm chair with cues for sequence and assist to guard LLE  to prevent ROM. +2 for safety to manage chair and safety  Ambulation/Gait                 Stairs             Wheelchair Mobility    Modified Rankin (Stroke Patients Only)       Balance                                            Cognition Arousal/Alertness: Awake/alert Behavior During Therapy: WFL for tasks assessed/performed Overall Cognitive Status: Within Functional Limits for tasks assessed                                 General Comments: pt with decreased awareness of sx and spacer in knee      Exercises General Exercises - Lower Extremity Ankle Circles/Pumps: AAROM;Left;Seated;10 reps Long Arc Quad: AROM;Right;Seated;10 reps Hip ABduction/ADduction: AAROM;Left;Seated;10 reps Straight Leg Raises: AAROM;Left;Seated;10 reps Hip Flexion/Marching: AROM;Right;Seated;10 reps    General Comments        Pertinent Vitals/Pain Pain Score: 4  Pain Location: L knee with movement Pain Descriptors / Indicators: Aching;Guarding Pain Intervention(s): Limited activity within patient's tolerance    Home Living                      Prior Function  PT Goals (current goals can now be found in the care plan section) Progress towards PT goals: Progressing toward goals    Frequency    Min 3X/week      PT Plan Current plan remains appropriate    Co-evaluation              AM-PAC PT "6 Clicks" Mobility   Outcome Measure  Help needed turning from your back to your side while in a flat bed without using bedrails?: A Lot Help needed moving from lying on your back to sitting on the side of a flat bed without using bedrails?: A Little Help needed moving to and from a bed to a chair (including a wheelchair)?: A Little Help needed standing up from a chair using your arms (e.g., wheelchair or bedside chair)?: Total Help needed to walk in hospital room?: Total Help needed climbing 3-5 steps with a  railing? : Total 6 Click Score: 11    End of Session Equipment Utilized During Treatment: Left knee immobilizer Activity Tolerance: Patient tolerated treatment well Patient left: in chair;with call bell/phone within reach Nurse Communication: Mobility status;Precautions;Weight bearing status(lateral scoot) PT Visit Diagnosis: Other abnormalities of gait and mobility (R26.89);Pain     Time: 0902-0926 PT Time Calculation (min) (ACUTE ONLY): 24 min  Charges:  $Therapeutic Exercise: 8-22 mins $Therapeutic Activity: 8-22 mins                     [name] P, PT Acute Rehabilitation Services Pager: [phone] Office: [place] 08/05/2019, 1:33 PM

## 2019-08-05 NOTE — Plan of Care (Signed)
  Problem: Education: Goal: Knowledge of General Education information will improve Description Including pain rating scale, medication(s)/side effects and non-pharmacologic comfort measures Outcome: Progressing   

## 2019-08-06 LAB — CBC
HCT: 27.1 % — ABNORMAL LOW (ref 39.0–52.0)
Hemoglobin: 8.4 g/dL — ABNORMAL LOW (ref 13.0–17.0)
MCH: 29.2 pg (ref 26.0–34.0)
MCHC: 31 g/dL (ref 30.0–36.0)
MCV: 94.1 fL (ref 80.0–100.0)
Platelets: 322 10*3/uL (ref 150–400)
RBC: 2.88 MIL/uL — ABNORMAL LOW (ref 4.22–5.81)
RDW: 15.7 % — ABNORMAL HIGH (ref 11.5–15.5)
WBC: 10.9 10*3/uL — ABNORMAL HIGH (ref 4.0–10.5)
nRBC: 0 % (ref 0.0–0.2)

## 2019-08-06 LAB — BASIC METABOLIC PANEL
Anion gap: 9 (ref 5–15)
BUN: 13 mg/dL (ref 6–20)
CO2: 29 mmol/L (ref 22–32)
Calcium: 8.4 mg/dL — ABNORMAL LOW (ref 8.9–10.3)
Chloride: 100 mmol/L (ref 98–111)
Creatinine, Ser: 0.7 mg/dL (ref 0.61–1.24)
GFR calc Af Amer: >60 mL/min (ref >60)
GFR calc non Af Amer: >60 mL/min (ref >60)
Glucose, Bld: 128 mg/dL — ABNORMAL HIGH (ref 70–99)
Potassium: 4.1 mmol/L (ref 3.5–5.1)
Sodium: 138 mmol/L (ref 135–145)

## 2019-08-06 LAB — GLUCOSE, CAPILLARY
Glucose-Capillary: 195 mg/dL — ABNORMAL HIGH (ref 70–99)
Glucose-Capillary: 147 mg/dL — ABNORMAL HIGH (ref 70–99)
Glucose-Capillary: 182 mg/dL — ABNORMAL HIGH (ref 70–99)
Glucose-Capillary: 163 mg/dL — ABNORMAL HIGH (ref 70–99)

## 2019-08-06 LAB — MAGNESIUM: Magnesium: 1.7 mg/dL (ref 1.7–2.4)

## 2019-08-06 MED ORDER — MORPHINE SULFATE ER 15 MG PO TBCR
30.0000 mg | EXTENDED_RELEASE_TABLET | Freq: Two times a day (BID) | ORAL | Status: DC
  Administered 2019-08-06 – 2019-08-08 (×4): 30 mg via ORAL
  Filled 2019-08-06 (×4): qty 2

## 2019-08-06 NOTE — Progress Notes (Signed)
[name]  PROGRESS NOTE    Vernon Frye  [id number] DOB: Jun 27, 1961 DOA: 07/12/2019 PCP: Nicholes Rough, PA-C   Brief Narrative:   59 yo M presenting with left knee swelling. Found to have septic arthritis of the knee. Seen by orthopedics for left total knee arthroplasty. Required intubation. Found to have new onset a fib RVR. Seen by cardiology. Had DCCV. Improving. Transferred to [facility] 08/03/19.  08/06/19: No acute events ON. Doing well this morning. Still working on placement. Continue abx for now.    Assessment & Plan:   Principal Problem:   MRSA bacteremia Active Problems:   Septic joint ([place])   Atrial fibrillation with rapid ventricular response (HCC)   AKI (acute kidney injury) ([place])   Hyponatremia   Severe sepsis (HCC)   Severe sepsis with acute organ dysfunction (HCC)   Respiratory insufficiency   Acute pain of left knee   Prosthetic joint infection (HCC)   Tachypnea   Acute bilateral low back pain without sciatica   Left arm swelling   Red man syndrome   Acute metabolic encephalopathy   S/P revision of total knee   Malnutrition of moderate degree   Acute respiratory failure ([place])  Acute Afib: - seen by cards - d/c DCCV - eliquis, metoprolol, amiodarone     - 08/06/19: HR good, he is stable, continue as above  MRSA bacteremia L knee septic joint - Bld Cx from 07/30/19 neg - on vanc thru 1/18 - TEE without vegetations.  - S/p knee replacement - Ortho signed off - Cont PT. Will refer to inpt rehab per Dr Quincy Sheehan request.     - 08/06/19: PT recs SNF, working on options; going to be difficult d/t insurance situation  Kleb uti:  - without tx - was untreated and he was asymptomic     - monitor for symptoms  Etoh abuse Acute encephalopathy (toxic vs metabolic)  - [id number]: No changes, he is stable, continue current tx  DVT prophylaxis: eliquis Code Status: FULL Family Communication: None at bedside   Disposition  Plan: TBD  Consultants:   Orthopedics  PCCM  Antimicrobials:  [name] Kitchen Vanc   ROS:  Denies CP, N, dyspnea, F, ab pain . Remainder 10-pt ROS is negative for all not previously mentioned.  Subjective: "I think it's going well."  Objective: Vitals:   08/05/19 2103 08/06/19 0500 08/06/19 0520 08/06/19 1504  BP: 140/84  118/79 (!) 143/90  Pulse: 88  71 90  Resp: 20  20 18   Temp: 99 F (37.2 C)  97.7 F (36.5 C) 97.8 F (36.6 C)  TempSrc: Axillary   Oral  SpO2: 99%  98% 99%  Weight:  109 kg    Height:        Intake/Output Summary (Last 24 hours) at 08/06/2019 1511 Last data filed at 08/06/2019 1321 Gross per 24 hour  Intake --  Output 2900 ml  Net -2900 ml   Filed Weights   08/03/19 0221 08/05/19 0434 08/06/19 0500  Weight: 104.8 kg 107.1 kg 109 kg    Examination:  General: 59 y.o. male resting in bed in NAD Cardiovascular: RRR, +S1, S2, no m/g/r, equal pulses throughout Respiratory: CTABL, no w/r/r, normal WOB GI: BS+, NDNT, soft, no masses noted MSK: No e/c/c Neuro: alert to name, follows commands Psyc: Appropriate interaction and affect, calm/cooperative   Data Reviewed: I have personally reviewed following labs and imaging studies.  CBC: Recent Labs  Lab 08/02/19 0416 08/03/19 0319 08/04/19 0305 08/05/19 0408 08/06/19 [id number]  WBC 10.2 9.4 9.8 11.5* 10.9*  HGB 8.6* 8.1* 9.0* 8.5* 8.4*  HCT 27.7* 26.3* 29.2* 27.4* 27.1*  MCV 93.6 94.6 92.7 93.2 94.1  PLT 224 246 297 330 [id number]   Basic Metabolic Panel: Recent Labs  Lab 08/02/19 0416 08/03/19 0319 08/04/19 0305 08/05/19 0408 08/06/19 0433  NA 137 137 140 137 138  K 3.7 3.5 3.6 3.7 4.1  CL 104 104 104 98 100  CO2 24 26 27 29 29   GLUCOSE 134* 148* 124* 148* 128*  BUN 17 21* 17 16 13   CREATININE 0.80 0.81 0.69 0.67 0.70  CALCIUM 7.9* 8.0* 8.4* 8.3* 8.4*  MG 2.0 1.8 1.6* 1.6* 1.7   GFR: Estimated Creatinine Clearance: 134.2 mL/min (by C-G formula based on SCr of 0.7 mg/dL). Liver Function Tests: No  results for input(s): AST, ALT, ALKPHOS, BILITOT, PROT, ALBUMIN in the last 168 hours. No results for input(s): LIPASE, AMYLASE in the last 168 hours. No results for input(s): AMMONIA in the last 168 hours. Coagulation Profile: Recent Labs  Lab 08/01/19 1609  INR 1.3*   Cardiac Enzymes: No results for input(s): CKTOTAL, CKMB, CKMBINDEX, TROPONINI in the last 168 hours. BNP (last 3 results) No results for input(s): PROBNP in the last 8760 hours. HbA1C: No results for input(s): HGBA1C in the last 72 hours. CBG: Recent Labs  Lab 08/05/19 1237 08/05/19 1648 08/05/19 2101 08/06/19 0905 08/06/19 1237  GLUCAP 151* 145* 187* 195* 147*   Lipid Profile: No results for input(s): CHOL, HDL, LDLCALC, TRIG, CHOLHDL, LDLDIRECT in the last 72 hours. Thyroid Function Tests: No results for input(s): TSH, T4TOTAL, FREET4, T3FREE, THYROIDAB in the last 72 hours. Anemia Panel: No results for input(s): VITAMINB12, FOLATE, FERRITIN, TIBC, IRON, RETICCTPCT in the last 72 hours. Sepsis Labs: No results for input(s): PROCALCITON, LATICACIDVEN in the last 168 hours.  Recent Results (from the past 240 hour(s))  C difficile quick scan w PCR reflex     Status: None   Collection Time: 07/30/19 11:49 AM   Specimen: STOOL  Result Value Ref Range Status   C Diff antigen NEGATIVE NEGATIVE Final   C Diff toxin NEGATIVE NEGATIVE Final   C Diff interpretation No C. difficile detected.  Final    Comment: Performed at [facility], [place] [address] [place], [place] [address]  Culture, Urine     Status: Abnormal   Collection Time: 07/30/19 11:49 AM   Specimen: Urine, Random  Result Value Ref Range Status   Specimen Description URINE, RANDOM  Final   Special Requests   Final    NONE Performed at [facility], [place] [address], [place],  [address]    Culture >=100,000 COLONIES/mL KLEBSIELLA OXYTOCA (A)  Final   Report Status 08/01/2019 FINAL  Final   Organism ID, Bacteria KLEBSIELLA  OXYTOCA (A)  Final      Susceptibility   Klebsiella oxytoca - MIC*    AMPICILLIN >=32 RESISTANT Resistant     CEFAZOLIN >=64 RESISTANT Resistant     CEFTRIAXONE <=0.25 SENSITIVE Sensitive     CIPROFLOXACIN <=0.25 SENSITIVE Sensitive     GENTAMICIN <=1 SENSITIVE Sensitive     IMIPENEM <=0.25 SENSITIVE Sensitive     NITROFURANTOIN 32 SENSITIVE Sensitive     TRIMETH/SULFA <=20 SENSITIVE Sensitive     AMPICILLIN/SULBACTAM 16 INTERMEDIATE Intermediate     PIP/TAZO <=4 SENSITIVE Sensitive     * >=100,000 COLONIES/mL KLEBSIELLA OXYTOCA  Culture, blood (routine x 2)     Status: None  Collection Time: 07/30/19 12:23 PM   Specimen: BLOOD  Result Value Ref Range Status   Specimen Description BLOOD LEFT ANTECUBITAL  Final   Special Requests   Final    BOTTLES DRAWN AEROBIC AND ANAEROBIC Blood Culture results may not be optimal due to an inadequate volume of blood received in culture bottles   Culture   Final    NO GROWTH 5 DAYS Performed at [facility], [place] [address], [place], [place] [address]    Report Status 08/04/2019 FINAL  Final  Culture, blood (routine x 2)     Status: None   Collection Time: 07/30/19 12:29 PM   Specimen: BLOOD LEFT HAND  Result Value Ref Range Status   Specimen Description BLOOD LEFT HAND  Final   Special Requests   Final    BOTTLES DRAWN AEROBIC ONLY Blood Culture adequate volume   Culture   Final    NO GROWTH 5 DAYS Performed at [facility], [place] [address], [place], [place] [address]    Report Status 08/04/2019 FINAL  Final      Radiology Studies: No results found.   Scheduled Meds: . acetaminophen  650 mg Oral Q6H  . amiodarone  200 mg Oral Daily  . apixaban  5 mg Oral BID  . chlorhexidine  15 mL Mouth Rinse BID  . Chlorhexidine Gluconate Cloth  6 each Topical Daily  . diazepam  5 mg Oral BID  . docusate sodium  100 mg Oral BID  . feeding supplement (ENSURE ENLIVE)  237 mL Oral BID BM  . folic acid  1 mg Oral Daily  . gabapentin   300 mg Oral TID  . insulin aspart  0-6 Units Subcutaneous TID AC & HS  . lisinopril  10 mg Oral Daily  . magnesium oxide  400 mg Oral BID  . mouth rinse  15 mL Mouth Rinse q12n4p  . metoprolol tartrate  50 mg Oral Q6H  . morphine  15 mg Oral Q12H  . multivitamin with minerals  1 tablet Oral Daily  . pantoprazole  40 mg Oral Daily  . psyllium  1 packet Oral Daily  . QUEtiapine  25 mg Oral BID  . sodium chloride flush  10-40 mL Intracatheter Q12H  . thiamine  100 mg Oral Daily   Continuous Infusions: . vancomycin 1,250 mg (08/06/19 1445)     LOS: 24 days    Time spent: 25 minutes spent in the coordination of care today.    Jonnie Finner, DO [facility]  If 7PM-7AM, please contact night-coverage [email or web address] 08/06/2019, 3:11 PM

## 2019-08-06 NOTE — Progress Notes (Signed)
Pharmacy Antibiotic Note  Vernon Frye is a 59 y.o. male admitted on 07/12/2019 with MRSA bacteremia and prosthetic joint infection. Pharmacy has been consulted for vancomycin dosing. Pt on extended infusion time (4hrs) to alleviate Red Man Syndrome; no adverse effects reported since this was begun. TEE negative for vegetations on 1/5, Cr stable, OPAT orders placed.  Vancomycin trough on 1/10 planned for 0230 (30 minutes prior to next infusion) and a peak planned at 0830 (1.5 hours after end of infusion start)  Plan: -Continue vancomycin 1250mg  IV q12h  -Monitor clinical progress, c/s, renal function -f/u Vancomycin levels on 1/10 -ID planning vancomycin continuing through 09/06/19   Height: 6\' 3"  (190.5 cm) Weight: 240 lb 4.8 oz (109 kg) IBW/kg (Calculated) : 84.5  Temp (24hrs), Avg:98.4 F (36.9 C), Min:97.7 F (36.5 C), Max:99 F (37.2 C)  Recent Labs  Lab 07/31/19 1044 07/31/19 1428 08/02/19 0416 08/03/19 0319 08/04/19 0305 08/05/19 0408 08/06/19 0433  WBC  --   --  10.2 9.4 9.8 11.5* 10.9*  CREATININE  --   --  0.80 0.81 0.69 0.67 0.70  VANCOTROUGH  --  15  --   --   --   --   --   VANCORANDOM 19  --   --   --   --   --   --     Estimated Creatinine Clearance: 134.2 mL/min (by C-G formula based on SCr of 0.7 mg/dL).    Allergies  Allergen Reactions  . Vancomycin Shortness Of Breath and Rash    [name]    Antimicrobials: Vanc 12/22>>(09/06/19) **Vanc 12/16 x1 - only received 1/2 dose d/t red man's syndrome, MD said d/c, then resume vanc Ceftriaxone 12/16 x1 Zosyn 12/16x1 Dapto 12/16>>12/22 - CK qWed - 126 (BL, 12/17)  12/25: VT 11, AUC 356, switched to vanc IV 1500 Q12 (over 4h d/t Red man, AUC 530) 12/28 VP = 28; trough 17 (giving over 4 hrs- but can use for kinetics)- AUC on 1500 q 12 = 591 - reduce to 1250 q 12 for AUC 492 12/31 VP 20, VT 13 - AUC 456 (giving over 4hrs) - cont 1250 q12h 1/3 VR 19, VT 15 - AUC 455 (giving over 4 hrs) - cont 1250  q12h 1/10 VT / VP planned >>>  Microbiology: 12/16 BCx 2/2 mrsa 12/16L knee fluid: abundant MRSA 12/16 Ucx: neg 12/16 Grp A strep - neg 12/17 BCx >>1/2 SA 12/18 BCx >> ngF 1/2 Cdiff >> neg 1/2 UCx >>  >100k GNR 1/2 BCx >> ngtd  Thank you for the interesting consult and for involving pharmacy in this patient's care.  Tamela Gammon, PharmD, BCPS 08/06/2019 8:26 AM PGY-2 Pharmacy Administration Resident Please check [email or web address] for unit-specific pharmacist phone numbers

## 2019-08-07 LAB — BASIC METABOLIC PANEL
Anion gap: 8 (ref 5–15)
BUN: 17 mg/dL (ref 6–20)
CO2: 28 mmol/L (ref 22–32)
Calcium: 8.5 mg/dL — ABNORMAL LOW (ref 8.9–10.3)
Chloride: 100 mmol/L (ref 98–111)
Creatinine, Ser: 0.81 mg/dL (ref 0.61–1.24)
GFR calc Af Amer: >60 mL/min (ref >60)
GFR calc non Af Amer: >60 mL/min (ref >60)
Glucose, Bld: 166 mg/dL — ABNORMAL HIGH (ref 70–99)
Potassium: 4.4 mmol/L (ref 3.5–5.1)
Sodium: 136 mmol/L (ref 135–145)

## 2019-08-07 LAB — CBC
HCT: 27.9 % — ABNORMAL LOW (ref 39.0–52.0)
Hemoglobin: 8.5 g/dL — ABNORMAL LOW (ref 13.0–17.0)
MCH: 28.9 pg (ref 26.0–34.0)
MCHC: 30.5 g/dL (ref 30.0–36.0)
MCV: 94.9 fL (ref 80.0–100.0)
Platelets: 344 10*3/uL (ref 150–400)
RBC: 2.94 MIL/uL — ABNORMAL LOW (ref 4.22–5.81)
RDW: 15.8 % — ABNORMAL HIGH (ref 11.5–15.5)
WBC: 11.3 10*3/uL — ABNORMAL HIGH (ref 4.0–10.5)
nRBC: 0 % (ref 0.0–0.2)

## 2019-08-07 LAB — GLUCOSE, CAPILLARY
Glucose-Capillary: 145 mg/dL — ABNORMAL HIGH (ref 70–99)
Glucose-Capillary: 115 mg/dL — ABNORMAL HIGH (ref 70–99)
Glucose-Capillary: 141 mg/dL — ABNORMAL HIGH (ref 70–99)
Glucose-Capillary: 108 mg/dL — ABNORMAL HIGH (ref 70–99)

## 2019-08-07 LAB — VANCOMYCIN, TROUGH: Vancomycin Tr: 14 ug/mL — ABNORMAL LOW (ref 15–20)

## 2019-08-07 LAB — MAGNESIUM: Magnesium: 1.7 mg/dL (ref 1.7–2.4)

## 2019-08-07 LAB — VANCOMYCIN, PEAK: Vancomycin Pk: 28 ug/mL — ABNORMAL LOW (ref 30–40)

## 2019-08-07 NOTE — Plan of Care (Signed)

## 2019-08-07 NOTE — Progress Notes (Signed)
Vernon Frye Kitchen  PROGRESS NOTE    Vernon Frye  [id number] DOB: 08/05/1960 DOA: 07/12/2019 PCP: Nicholes Rough, PA-C   Brief Narrative:   59 yo M presenting with left knee swelling. Found to have septic arthritis of the knee. Seen by orthopedics for left total knee arthroplasty. Required intubation. Found to have new onset a fib RVR. Seen by cardiology. Had DCCV. Improving. Transferred to [facility] 08/03/19.  08/07/19: Sleeping this AM. No acute events Continuing current abx. Hopeful for SNF placement.    Assessment & Plan:   Principal Problem:   MRSA bacteremia Active Problems:   Septic joint ([place])   Atrial fibrillation with rapid ventricular response (HCC)   AKI (acute kidney injury) ([place])   Hyponatremia   Severe sepsis (HCC)   Severe sepsis with acute organ dysfunction (HCC)   Respiratory insufficiency   Acute pain of left knee   Prosthetic joint infection (HCC)   Tachypnea   Acute bilateral low back pain without sciatica   Left arm swelling   Red man syndrome   Acute metabolic encephalopathy   S/P revision of total knee   Malnutrition of moderate degree   Acute respiratory failure ([place])  Acute Afib: - seen by cards - d/c DCCV - eliquis, metoprolol, amiodarone - 08/07/19: HR is stable, he is doing well, continue as above.   MRSA bacteremia L knee septic joint - Bld Cx from 07/30/19 neg - on vanc thru 1/18 - TEE without vegetations.  - S/p knee replacement - Ortho signed off - Cont PT. Will refer to inpt rehab per Dr Quincy Sheehan request. - 08/07/19: SNF placement will be difficult d/t insurance, may need alternative  Kleb uti:  - without tx - was untreated and he was asymptomic     - monitor for symptoms  Etoh abuse Acute encephalopathy (toxic vs metabolic)  - [id number]: He is stable, A&O x 3, no issues at this time  DVT prophylaxis: eliquis Code Status: FULL Family Communication: None at bedside   Disposition Plan:  TBD  Consultants:   Orthopedics  PCCM  Antimicrobials:  Vernon Frye Kitchen Vanc   Subjective: No acute events ON.   Objective: Vitals:   08/06/19 1504 08/06/19 2111 08/07/19 0600 08/07/19 1350  BP: (!) 143/90 (!) 145/86 122/73 122/80  Pulse: 90 79 80 77  Resp: 18 18 12 20   Temp: 97.8 F (36.6 C) (!) 97.5 F (36.4 C) 97.7 F (36.5 C) 97.8 F (36.6 C)  TempSrc: Oral Oral Oral   SpO2: 99% 98% 99% 100%  Weight:      Height:        Intake/Output Summary (Last 24 hours) at 08/07/2019 1453 Last data filed at 08/07/2019 0000 Gross per 24 hour  Intake 450 ml  Output 2150 ml  Net -1700 ml   Filed Weights   08/03/19 0221 08/05/19 0434 08/06/19 0500  Weight: 104.8 kg 107.1 kg 109 kg    Examination:  General: 59 y.o. male resting in bed in NAD Cardiovascular: RRR, +S1, S2, no m/g/r Respiratory: CTABL, no w/r/r, normal WOB GI: BS+, NDNT, no masses noted, no organomegaly noted MSK: No c/c; LLE edema/brace in place Neuro: somnolent  Data Reviewed: I have personally reviewed following labs and imaging studies.  CBC: Recent Labs  Lab 08/03/19 0319 08/04/19 0305 08/05/19 0408 08/06/19 0433 08/07/19 0314  WBC 9.4 9.8 11.5* 10.9* 11.3*  HGB 8.1* 9.0* 8.5* 8.4* 8.5*  HCT 26.3* 29.2* 27.4* 27.1* 27.9*  MCV 94.6 92.7 93.2 94.1 94.9  PLT 246 297 330  322 [id number]   Basic Metabolic Panel: Recent Labs  Lab 08/03/19 0319 08/04/19 0305 08/05/19 0408 08/06/19 0433 08/07/19 0314  NA 137 140 137 138 136  K 3.5 3.6 3.7 4.1 4.4  CL 104 104 98 100 100  CO2 26 27 29 29 28   GLUCOSE 148* 124* 148* 128* 166*  BUN 21* 17 16 13 17   CREATININE 0.81 0.69 0.67 0.70 0.81  CALCIUM 8.0* 8.4* 8.3* 8.4* 8.5*  MG 1.8 1.6* 1.6* 1.7 1.7   GFR: Estimated Creatinine Clearance: 132.6 mL/min (by C-G formula based on SCr of 0.81 mg/dL). Liver Function Tests: No results for input(s): AST, ALT, ALKPHOS, BILITOT, PROT, ALBUMIN in the last 168 hours. No results for input(s): LIPASE, AMYLASE in the last 168  hours. No results for input(s): AMMONIA in the last 168 hours. Coagulation Profile: Recent Labs  Lab 08/01/19 1609  INR 1.3*   Cardiac Enzymes: No results for input(s): CKTOTAL, CKMB, CKMBINDEX, TROPONINI in the last 168 hours. BNP (last 3 results) No results for input(s): PROBNP in the last 8760 hours. HbA1C: No results for input(s): HGBA1C in the last 72 hours. CBG: Recent Labs  Lab 08/06/19 1237 08/06/19 1706 08/06/19 2109 08/07/19 0737 08/07/19 1220  GLUCAP 147* 182* 163* 145* 115*   Lipid Profile: No results for input(s): CHOL, HDL, LDLCALC, TRIG, CHOLHDL, LDLDIRECT in the last 72 hours. Thyroid Function Tests: No results for input(s): TSH, T4TOTAL, FREET4, T3FREE, THYROIDAB in the last 72 hours. Anemia Panel: No results for input(s): VITAMINB12, FOLATE, FERRITIN, TIBC, IRON, RETICCTPCT in the last 72 hours. Sepsis Labs: No results for input(s): PROCALCITON, LATICACIDVEN in the last 168 hours.  Recent Results (from the past 240 hour(s))  C difficile quick scan w PCR reflex     Status: None   Collection Time: 07/30/19 11:49 AM   Specimen: STOOL  Result Value Ref Range Status   C Diff antigen NEGATIVE NEGATIVE Final   C Diff toxin NEGATIVE NEGATIVE Final   C Diff interpretation No C. difficile detected.  Final    Comment: Performed at [facility], [place] [address] [place], [place] [address]  Culture, Urine     Status: Abnormal   Collection Time: 07/30/19 11:49 AM   Specimen: Urine, Random  Result Value Ref Range Status   Specimen Description URINE, RANDOM  Final   Special Requests   Final    NONE Performed at [facility], [place] [address] [place], [place] [address]    Culture >=100,000 COLONIES/mL KLEBSIELLA OXYTOCA (A)  Final   Report Status 08/01/2019 FINAL  Final   Organism ID, Bacteria KLEBSIELLA OXYTOCA (A)  Final      Susceptibility   Klebsiella oxytoca - MIC*    AMPICILLIN >=32 RESISTANT Resistant     CEFAZOLIN >=64 RESISTANT Resistant      CEFTRIAXONE <=0.25 SENSITIVE Sensitive     CIPROFLOXACIN <=0.25 SENSITIVE Sensitive     GENTAMICIN <=1 SENSITIVE Sensitive     IMIPENEM <=0.25 SENSITIVE Sensitive     NITROFURANTOIN 32 SENSITIVE Sensitive     TRIMETH/SULFA <=20 SENSITIVE Sensitive     AMPICILLIN/SULBACTAM 16 INTERMEDIATE Intermediate     PIP/TAZO <=4 SENSITIVE Sensitive     * >=100,000 COLONIES/mL KLEBSIELLA OXYTOCA  Culture, blood (routine x 2)     Status: None   Collection Time: 07/30/19 12:23 PM   Specimen: BLOOD  Result Value Ref Range Status   Specimen Description BLOOD LEFT ANTECUBITAL  Final   Special Requests   Final  BOTTLES DRAWN AEROBIC AND ANAEROBIC Blood Culture results may not be optimal due to an inadequate volume of blood received in culture bottles   Culture   Final    NO GROWTH 5 DAYS Performed at [facility], [place] [address] [place], [place] [address]    Report Status 08/04/2019 FINAL  Final  Culture, blood (routine x 2)     Status: None   Collection Time: 07/30/19 12:29 PM   Specimen: BLOOD LEFT HAND  Result Value Ref Range Status   Specimen Description BLOOD LEFT HAND  Final   Special Requests   Final    BOTTLES DRAWN AEROBIC ONLY Blood Culture adequate volume   Culture   Final    NO GROWTH 5 DAYS Performed at [facility], [place] [address] [place], [place] [address]    Report Status 08/04/2019 FINAL  Final      Radiology Studies: No results found.   Scheduled Meds: . acetaminophen  650 mg Oral Q6H  . amiodarone  200 mg Oral Daily  . apixaban  5 mg Oral BID  . chlorhexidine  15 mL Mouth Rinse BID  . Chlorhexidine Gluconate Cloth  6 each Topical Daily  . diazepam  5 mg Oral BID  . docusate sodium  100 mg Oral BID  . feeding supplement (ENSURE ENLIVE)  237 mL Oral BID BM  . folic acid  1 mg Oral Daily  . gabapentin  300 mg Oral TID  . insulin aspart  0-6 Units Subcutaneous TID AC & HS  . lisinopril  10 mg Oral Daily  . magnesium oxide  400 mg Oral BID  . mouth  rinse  15 mL Mouth Rinse q12n4p  . metoprolol tartrate  50 mg Oral Q6H  . morphine  30 mg Oral Q12H  . multivitamin with minerals  1 tablet Oral Daily  . pantoprazole  40 mg Oral Daily  . psyllium  1 packet Oral Daily  . QUEtiapine  25 mg Oral BID  . sodium chloride flush  10-40 mL Intracatheter Q12H  . thiamine  100 mg Oral Daily   Continuous Infusions: . vancomycin 1,250 mg (08/07/19 0353)     LOS: 25 days    Time spent: 25 minutes spent in the coordination of care today.   Jonnie Finner, DO [facility]  If 7PM-7AM, please contact night-coverage [email or web address] 08/07/2019, 2:53 PM

## 2019-08-07 NOTE — Progress Notes (Signed)
Pharmacy Antibiotic Note  Vernon Frye is a 59 y.o. male admitted on 07/12/2019 with MRSA bacteremia and prosthetic joint infection. Pharmacy has been consulted for vancomycin dosing. Pt on extended infusion time (4hrs) to alleviate Red Man Syndrome; no adverse effects reported since this was begun. TEE negative for vegetations on 1/5, Cr stable, OPAT orders placed.  Vancomycin trough on 1/10 at 0314: 14; Vancomycin 1250mg  dose at 0353; Vancomycin peak on 1/10 at 915: 28; Upon extrapolation through vancomycin calculation, there was an AUC calculation of 525, cMax 32.33mcg/mL. This is an accurate target for this patient. Will continue vancomycin 1250mg  q12h.    Plan: -Continue vancomycin 1250mg  IV q12h  -Monitor clinical progress, c/s, renal function -ID planning vancomycin continuing through 09/06/19   Height: 6\' 3"  (190.5 cm) Weight: 240 lb 4.8 oz (109 kg) IBW/kg (Calculated) : 84.5  Temp (24hrs), Avg:97.7 F (36.5 C), Min:97.5 F (36.4 C), Max:97.8 F (36.6 C)  Recent Labs  Lab 07/31/19 1428 08/03/19 0319 08/04/19 0305 08/05/19 0408 08/06/19 0433 08/07/19 0314 08/07/19 0915  WBC  --  9.4 9.8 11.5* 10.9* 11.3*  --   CREATININE  --  0.81 0.69 0.67 0.70 0.81  --   VANCOTROUGH 15  --   --   --   --  14*  --   VANCOPEAK  --   --   --   --   --   --  28*    Estimated Creatinine Clearance: 132.6 mL/min (by C-G formula based on SCr of 0.81 mg/dL).    Allergies  Allergen Reactions  . Vancomycin Shortness Of Breath and Rash    [name]    Antimicrobials: Vanc 12/22>>(09/06/19) **Vanc 12/16 x1 - only received 1/2 dose d/t red man's syndrome, MD said d/c, then resume vanc Ceftriaxone 12/16 x1 Zosyn 12/16x1 Dapto 12/16>>12/22 - CK qWed - 126 (BL, 12/17)  12/25: VT 11, AUC 356, switched to vanc IV 1500 Q12 (over 4h d/t Red man, AUC 530) 12/28 VP = 28; trough 17 (giving over 4 hrs- but can use for kinetics)- AUC on 1500 q 12 = 591 - reduce to 1250 q 12 for AUC  492 12/31 VP 20, VT 13 - AUC 456 (giving over 4hrs) - cont 1250 q12h 1/3 VR 19, VT 15 - AUC 455 (giving over 4 hrs) - cont 1250 q12h 1/10 VT 14, VP 28 - cont 1250mg  q12h  Microbiology: 12/16 BCx 2/2 mrsa 12/16L knee fluid: abundant MRSA 12/16 Ucx: neg 12/16 Grp A strep - neg 12/17 BCx >>1/2 SA 12/18 BCx >> ngF 1/2 Cdiff >> neg 1/2 UCx >>  >100k GNR 1/2 BCx >> ngtd  Thank you for the interesting consult and for involving pharmacy in this patient's care.  Tamela Gammon, PharmD, BCPS 08/07/2019 10:51 AM PGY-2 Pharmacy Administration Resident Please check [email or web address] for unit-specific pharmacist phone numbers

## 2019-08-08 LAB — CBC WITH DIFFERENTIAL/PLATELET
Abs Immature Granulocytes: 0.75 10*3/uL — ABNORMAL HIGH (ref 0.00–0.07)
Basophils Absolute: 0.1 10*3/uL (ref 0.0–0.1)
Basophils Relative: 1 %
Eosinophils Absolute: 0.2 10*3/uL (ref 0.0–0.5)
Eosinophils Relative: 2 %
HCT: 29.4 % — ABNORMAL LOW (ref 39.0–52.0)
Hemoglobin: 9.1 g/dL — ABNORMAL LOW (ref 13.0–17.0)
Immature Granulocytes: 7 %
Lymphocytes Relative: 16 %
Lymphs Abs: 1.9 10*3/uL (ref 0.7–4.0)
MCH: 28.8 pg (ref 26.0–34.0)
MCHC: 31 g/dL (ref 30.0–36.0)
MCV: 93 fL (ref 80.0–100.0)
Monocytes Absolute: 0.7 10*3/uL (ref 0.1–1.0)
Monocytes Relative: 6 %
Neutro Abs: 7.9 10*3/uL — ABNORMAL HIGH (ref 1.7–7.7)
Neutrophils Relative %: 68 %
Platelets: 349 10*3/uL (ref 150–400)
RBC: 3.16 MIL/uL — ABNORMAL LOW (ref 4.22–5.81)
RDW: 15.9 % — ABNORMAL HIGH (ref 11.5–15.5)
WBC: 11.6 10*3/uL — ABNORMAL HIGH (ref 4.0–10.5)
nRBC: 0 % (ref 0.0–0.2)

## 2019-08-08 LAB — RENAL FUNCTION PANEL
Albumin: 2 g/dL — ABNORMAL LOW (ref 3.5–5.0)
Anion gap: 9 (ref 5–15)
BUN: 16 mg/dL (ref 6–20)
CO2: 28 mmol/L (ref 22–32)
Calcium: 9 mg/dL (ref 8.9–10.3)
Chloride: 99 mmol/L (ref 98–111)
Creatinine, Ser: 0.78 mg/dL (ref 0.61–1.24)
GFR calc Af Amer: >60 mL/min (ref >60)
GFR calc non Af Amer: >60 mL/min (ref >60)
Glucose, Bld: 144 mg/dL — ABNORMAL HIGH (ref 70–99)
Phosphorus: 4.5 mg/dL (ref 2.5–4.6)
Potassium: 4.7 mmol/L (ref 3.5–5.1)
Sodium: 136 mmol/L (ref 135–145)

## 2019-08-08 LAB — GLUCOSE, CAPILLARY
Glucose-Capillary: 233 mg/dL — ABNORMAL HIGH (ref 70–99)
Glucose-Capillary: 148 mg/dL — ABNORMAL HIGH (ref 70–99)
Glucose-Capillary: 168 mg/dL — ABNORMAL HIGH (ref 70–99)
Glucose-Capillary: 139 mg/dL — ABNORMAL HIGH (ref 70–99)

## 2019-08-08 LAB — MAGNESIUM: Magnesium: 1.9 mg/dL (ref 1.7–2.4)

## 2019-08-08 MED ORDER — OXYCODONE HCL 5 MG PO TABS
10.0000 mg | ORAL_TABLET | ORAL | Status: DC | PRN
  Administered 2019-08-08 – 2019-08-09 (×4): 10 mg via ORAL
  Filled 2019-08-08 (×4): qty 2

## 2019-08-08 MED ORDER — DICLOFENAC SODIUM 1 % EX GEL
4.0000 g | Freq: Four times a day (QID) | CUTANEOUS | Status: AC
  Administered 2019-08-08: 4 g via TOPICAL
  Filled 2019-08-08: qty 100

## 2019-08-08 MED ORDER — MORPHINE SULFATE ER 15 MG PO TBCR
15.0000 mg | EXTENDED_RELEASE_TABLET | Freq: Two times a day (BID) | ORAL | Status: DC
  Administered 2019-08-08 – 2019-09-07 (×58): 15 mg via ORAL
  Filled 2019-08-08 (×59): qty 1

## 2019-08-08 NOTE — Plan of Care (Signed)

## 2019-08-08 NOTE — Progress Notes (Signed)
[name]  PROGRESS NOTE    Vernon Frye  [id number] DOB: 1961-03-06 DOA: 07/12/2019 PCP: Nicholes Rough, PA-C   Brief Narrative:   59 yo M presenting with left knee swelling. Found to have septic arthritis of the knee. Seen by orthopedics for left total knee arthroplasty. Required intubation. Found to have new onset a fib RVR. Seen by cardiology. Had DCCV. Improving. Transferred to [facility] 08/03/19.  08/08/19: No acute events ON. Somnolent again this AM. Reduce narcotics. Continue abx.    Assessment & Plan:   Principal Problem:   MRSA bacteremia Active Problems:   Septic joint ([place])   Atrial fibrillation with rapid ventricular response (HCC)   AKI (acute kidney injury) ([place])   Hyponatremia   Severe sepsis (HCC)   Severe sepsis with acute organ dysfunction (HCC)   Respiratory insufficiency   Acute pain of left knee   Prosthetic joint infection (HCC)   Tachypnea   Acute bilateral low back pain without sciatica   Left arm swelling   Red man syndrome   Acute metabolic encephalopathy   S/P revision of total knee   Malnutrition of moderate degree   Acute respiratory failure (HCC)  Acute Afib: - seen by cards - d/c DCCV - eliquis, metoprolol, amiodarone     - continue current tx, he is stable  MRSA bacteremia L knee septic joint - Bld Cx from 07/30/19 neg - on vanc thru 1/18 - TEE without vegetations.  - S/p knee replacement - Ortho signed off - Cont PT. Will refer to inpt rehab per Dr Quincy Sheehan request. - Needs SNF, but has no insurance, will need to see if family can take him onboard  Kleb uti:  - without tx - was untreated and he was asymptomic - monitor for symptoms  Etoh abuse Acute encephalopathy (toxic vs metabolic)  -resolved, monitor  DVT prophylaxis: eliquis Code Status: FULL Family Communication: None at bedside   Disposition Plan: TBD  Consultants:   Orthopedics  PCCM  Antimicrobials:  . PCCM     Subjective: No acute events ON.   Objective: Vitals:   08/07/19 0600 08/07/19 1350 08/07/19 2129 08/08/19 0648  BP: 122/73 122/80 (!) 136/92 114/88  Pulse: 80 77 80 67  Resp: 12 20 20 19   Temp: 97.7 F (36.5 C) 97.8 F (36.6 C) (!) 97.5 F (36.4 C) 97.6 F (36.4 C)  TempSrc: Oral  Oral Oral  SpO2: 99% 100% 100% 98%  Weight:      Height:       No intake or output data in the 24 hours ending 08/08/19 0836 Filed Weights   08/03/19 0221 08/05/19 0434 08/06/19 0500  Weight: 104.8 kg 107.1 kg 109 kg    Examination:  General: 59 y.o. male resting in bed in NAD Cardiovascular: RRR, +S1, S2, no m/g/r Respiratory: CTABL, no w/r/r, normal WOB GI: BS+, NDNT, no masses noted MSK: No c/c; stable LLE edema, LLE bandaging CDI Neuro: somnolent  Data Reviewed: I have personally reviewed following labs and imaging studies.  CBC: Recent Labs  Lab 08/04/19 0305 08/05/19 0408 08/06/19 0433 08/07/19 0314 08/08/19 0313  WBC 9.8 11.5* 10.9* 11.3* 11.6*  NEUTROABS  --   --   --   --  7.9*  HGB 9.0* 8.5* 8.4* 8.5* 9.1*  HCT 29.2* 27.4* 27.1* 27.9* 29.4*  MCV 92.7 93.2 94.1 94.9 93.0  PLT 297 330 322 344 0000000   Basic Metabolic Panel: Recent Labs  Lab 08/04/19 0305 08/05/19 0408 08/06/19 0433 08/07/19 0314 08/08/19  0313  NA 140 137 138 136 136  K 3.6 3.7 4.1 4.4 4.7  CL 104 98 100 100 99  CO2 27 29 29 28 28   GLUCOSE 124* 148* 128* 166* 144*  BUN 17 16 13 17 16   CREATININE 0.69 0.67 0.70 0.81 0.78  CALCIUM 8.4* 8.3* 8.4* 8.5* 9.0  MG 1.6* 1.6* 1.7 1.7 1.9  PHOS  --   --   --   --  4.5   GFR: Estimated Creatinine Clearance: 134.2 mL/min (by C-G formula based on SCr of 0.78 mg/dL). Liver Function Tests: Recent Labs  Lab 08/08/19 0313  ALBUMIN 2.0*   No results for input(s): LIPASE, AMYLASE in the last 168 hours. No results for input(s): AMMONIA in the last 168 hours. Coagulation Profile: Recent Labs  Lab 08/01/19 1609  INR 1.3*   Cardiac Enzymes: No results  for input(s): CKTOTAL, CKMB, CKMBINDEX, TROPONINI in the last 168 hours. BNP (last 3 results) No results for input(s): PROBNP in the last 8760 hours. HbA1C: No results for input(s): HGBA1C in the last 72 hours. CBG: Recent Labs  Lab 08/07/19 0737 08/07/19 1220 08/07/19 1628 08/07/19 2102 08/08/19 0814  GLUCAP 145* 115* 141* 108* 233*   Lipid Profile: No results for input(s): CHOL, HDL, LDLCALC, TRIG, CHOLHDL, LDLDIRECT in the last 72 hours. Thyroid Function Tests: No results for input(s): TSH, T4TOTAL, FREET4, T3FREE, THYROIDAB in the last 72 hours. Anemia Panel: No results for input(s): VITAMINB12, FOLATE, FERRITIN, TIBC, IRON, RETICCTPCT in the last 72 hours. Sepsis Labs: No results for input(s): PROCALCITON, LATICACIDVEN in the last 168 hours.  Recent Results (from the past 240 hour(s))  C difficile quick scan w PCR reflex     Status: None   Collection Time: 07/30/19 11:49 AM   Specimen: STOOL  Result Value Ref Range Status   C Diff antigen NEGATIVE NEGATIVE Final   C Diff toxin NEGATIVE NEGATIVE Final   C Diff interpretation No C. difficile detected.  Final    Comment: Performed at [facility], [place] [address] [place], [place] [address]  Culture, Urine     Status: Abnormal   Collection Time: 07/30/19 11:49 AM   Specimen: Urine, Random  Result Value Ref Range Status   Specimen Description URINE, RANDOM  Final   Special Requests   Final    NONE Performed at [facility], [place] [address], [place], [place] [address]    Culture >=100,000 COLONIES/mL KLEBSIELLA OXYTOCA (A)  Final   Report Status 08/01/2019 FINAL  Final   Organism ID, Bacteria KLEBSIELLA OXYTOCA (A)  Final      Susceptibility   Klebsiella oxytoca - MIC*    AMPICILLIN >=32 RESISTANT Resistant     CEFAZOLIN >=64 RESISTANT Resistant     CEFTRIAXONE <=0.25 SENSITIVE Sensitive     CIPROFLOXACIN <=0.25 SENSITIVE Sensitive     GENTAMICIN <=1 SENSITIVE Sensitive     IMIPENEM <=0.25 SENSITIVE  Sensitive     NITROFURANTOIN 32 SENSITIVE Sensitive     TRIMETH/SULFA <=20 SENSITIVE Sensitive     AMPICILLIN/SULBACTAM 16 INTERMEDIATE Intermediate     PIP/TAZO <=4 SENSITIVE Sensitive     * >=100,000 COLONIES/mL KLEBSIELLA OXYTOCA  Culture, blood (routine x 2)     Status: None   Collection Time: 07/30/19 12:23 PM   Specimen: BLOOD  Result Value Ref Range Status   Specimen Description BLOOD LEFT ANTECUBITAL  Final   Special Requests   Final    BOTTLES DRAWN AEROBIC AND ANAEROBIC Blood Culture  results may not be optimal due to an inadequate volume of blood received in culture bottles   Culture   Final    NO GROWTH 5 DAYS Performed at [facility], [place] [address] [place], [place] [address]    Report Status 08/04/2019 FINAL  Final  Culture, blood (routine x 2)     Status: None   Collection Time: 07/30/19 12:29 PM   Specimen: BLOOD LEFT HAND  Result Value Ref Range Status   Specimen Description BLOOD LEFT HAND  Final   Special Requests   Final    BOTTLES DRAWN AEROBIC ONLY Blood Culture adequate volume   Culture   Final    NO GROWTH 5 DAYS Performed at [facility], [place] [address] [place], [place] [address]    Report Status 08/04/2019 FINAL  Final      Radiology Studies: No results found.   Scheduled Meds: . acetaminophen  650 mg Oral Q6H  . amiodarone  200 mg Oral Daily  . apixaban  5 mg Oral BID  . chlorhexidine  15 mL Mouth Rinse BID  . Chlorhexidine Gluconate Cloth  6 each Topical Daily  . diazepam  5 mg Oral BID  . docusate sodium  100 mg Oral BID  . feeding supplement (ENSURE ENLIVE)  237 mL Oral BID BM  . folic acid  1 mg Oral Daily  . gabapentin  300 mg Oral TID  . insulin aspart  0-6 Units Subcutaneous TID AC & HS  . lisinopril  10 mg Oral Daily  . magnesium oxide  400 mg Oral BID  . mouth rinse  15 mL Mouth Rinse q12n4p  . metoprolol tartrate  50 mg Oral Q6H  . morphine  30 mg Oral Q12H  . multivitamin with minerals  1 tablet Oral Daily  .  pantoprazole  40 mg Oral Daily  . psyllium  1 packet Oral Daily  . QUEtiapine  25 mg Oral BID  . sodium chloride flush  10-40 mL Intracatheter Q12H  . thiamine  100 mg Oral Daily   Continuous Infusions: . vancomycin 1,250 mg (08/08/19 0326)     LOS: 26 days    Time spent: 25 minutes spent in the coordination of care today.    Jonnie Finner, DO [facility]  If 7PM-7AM, please contact night-coverage [email or web address] 08/08/2019, 8:36 AM

## 2019-08-08 NOTE — Progress Notes (Signed)
Physical Therapy Treatment Patient Details Name: Vernon Frye MRN: [id number] DOB: 1960-12-06 Today's Date: 08/08/2019    History of Present Illness 59 yo M PMH Afib, HTN, L TKR (2016) who presented to ED with Lt knee pain. 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 Desaturated with hypoxemic respiratory failure requiring 3LNC. Pt underwent L TKA removal with placement of antibiotic spacers on 12/29, agitated after procedure and unable to be extubated. Extubated 07/31/19.    PT Comments    Pt without KI in place on arrival with physical assist to lift leg and place KI with pt educate for position and use with removal periodically for skin care but otherwise should remain in place. Pt with significantly decreased functional mobility compared to last session with pt unable to support trunk for >1 min to progress to transfers and OOB. Pt educated for bil LE HEP, progression and need to work on [place] as well as bil UE activation and trunk support to increase function and mobility.     Follow Up Recommendations  SNF;Supervision/Assistance - 24 hour     Equipment Recommendations  Wheelchair (measurements PT);Wheelchair cushion (measurements PT);Hospital bed;3in1 (PT)    Recommendations for Other Services       Precautions / Restrictions Precautions Precautions: Fall Knee Immobilizer - Left: On at all times Restrictions LLE Weight Bearing: Touchdown weight bearing Other Position/Activity Restrictions: No L knee ROM    Mobility  Bed Mobility Overal bed mobility: Needs Assistance Bed Mobility: Supine to Sit     Supine to sit: Mod assist;HOB elevated     General bed mobility comments: with HOB elevated 30 degrees pt requiring mod assist to achieve long sitting x 2 trials. pt able to maintain long sitting with bil UE support grossly 1 min before reporting bil UE fatigue and pain. With max assist to  shift sacrum in sitting pt unable to significantly move pelvis with 1 person assist this session x 2 trials. Max assist with bed in trendelenburg to slide toward [facility]  Transfers                 General transfer comment: pt with significantly decreased function with sitting and transfer today and unable to progress to OOB with 1 person assist  Ambulation/Gait                 Stairs             Wheelchair Mobility    Modified Rankin (Stroke Patients Only)       Balance Overall balance assessment: Needs assistance Sitting-balance support: Bilateral upper extremity supported;Feet supported Sitting balance-Leahy Scale: Poor Sitting balance - Comments: minguard for long sitting with bil UE and bil LE supported Postural control: Posterior lean                                  Cognition Arousal/Alertness: Awake/alert Behavior During Therapy: WFL for tasks assessed/performed Overall Cognitive Status: Impaired/Different from baseline Area of Impairment: Memory;Following commands                     Memory: Decreased short-term memory Following Commands: Follows one step commands consistently       General Comments: pt with decreased awareness of deficits, decreased recall of last session and difficulty following commands for reps with HEP today  Exercises General Exercises - Lower Extremity Heel Slides: AROM;Right;Supine;10 reps Hip ABduction/ADduction: AAROM;10 reps;Both;Supine Straight Leg Raises: AAROM;10 reps;Both;Supine    General Comments        Pertinent Vitals/Pain Pain Score: 6  Pain Location: LLE with activity, pt reporting pain in bil shoulders and hands Pain Descriptors / Indicators: Aching;Guarding Pain Intervention(s): Limited activity within patient's tolerance;Monitored during session;Repositioned    Home Living                      Prior Function            PT Goals (current goals can now be  found in the care plan section) Progress towards PT goals: Not progressing toward goals - comment    Frequency           PT Plan Current plan remains appropriate    Co-evaluation              AM-PAC PT "6 Clicks" Mobility   Outcome Measure  Help needed turning from your back to your side while in a flat bed without using bedrails?: Total Help needed moving from lying on your back to sitting on the side of a flat bed without using bedrails?: A Lot Help needed moving to and from a bed to a chair (including a wheelchair)?: Total Help needed standing up from a chair using your arms (e.g., wheelchair or bedside chair)?: Total Help needed to walk in hospital room?: Total Help needed climbing 3-5 steps with a railing? : Total 6 Click Score: 7    End of Session Equipment Utilized During Treatment: Left knee immobilizer Activity Tolerance: Patient tolerated treatment well Patient left: in bed;with call bell/phone within reach;with bed alarm set Nurse Communication: Mobility status;Precautions;Weight bearing status PT Visit Diagnosis: Other abnormalities of gait and mobility (R26.89);Pain     Time: [id number] PT Time Calculation (min) (ACUTE ONLY): 25 min  Charges:  $Therapeutic Exercise: 8-22 mins $Therapeutic Activity: 8-22 mins                     Vernon Frye P, PT Acute Rehabilitation Services Pager: [phone] Office: [phone]    Vernon Frye B Vernon Frye 08/08/2019, 1:51 PM

## 2019-08-08 NOTE — Progress Notes (Addendum)
Pharmacy Medication Review  Please consider transitioning patient back to home metoprolol succinate 200mg  daily to assist with adherence (currently metoprolol tartrate 50mg  Q6 hr is ordered).  Please consider tapering off scheduled diazepam 5mg  BID as patient's home medication was aplrazolam 1mg  TID PRN anxiety and patient is somnolent. Diazepam has a half life of 45-100 hours (parent drug and metabolite) so will take a long time to be eliminated from his system. Please consider decreasing to 2.5mg  BID.   Benetta Spar, PharmD, BCPS, BCCP Clinical Pharmacist  Please check AMION for all [place] phone numbers After 10:00 PM, call [place] [phone]

## 2019-08-08 NOTE — Progress Notes (Signed)
Occupational Therapy Treatment Patient Details Name: Vernon Frye MRN: [id number] DOB: [birth year] Today's Date: 08/08/2019    History of present illness 59 yo M PMH Afib, HTN, L TKR (2016) who presented to ED with Lt knee pain. 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 Desaturated with hypoxemic respiratory failure requiring 3LNC. Pt underwent L TKA removal with placement of antibiotic spacers on 12/29, agitated after procedure and unable to be extubated. Extubated 07/31/19.   OT comments  Pt making steady progress towards OT goals this session. Session focus on functional transfer training from EOB>recliner via lateral scoot towards R side. Pt required MAX A +2 for all aspects of mobility needing cues for safety to maintain WB restrictions. Pt noted to attempt to move LLE with gait belt upon OT arrival motivated to work with therapy. Overall, pt limited by pain and generalized weakness. DC plan remains appropriate, will continue to follow acutely per POC.    Follow Up Recommendations  SNF;Supervision/Assistance - 24 hour    Equipment Recommendations  Other (comment)(defer to next venue of care)    Recommendations for Other Services      Precautions / Restrictions Precautions Precautions: Fall Precaution Comments: watch HR Required Braces or Orthoses: Knee Immobilizer - Left Knee Immobilizer - Left: On at all times Restrictions Weight Bearing Restrictions: Yes LLE Weight Bearing: Touchdown weight bearing Other Position/Activity Restrictions: No L knee ROM       Mobility Bed Mobility Overal bed mobility: Needs Assistance Bed Mobility: Supine to Sit     Supine to sit: Max assist;+2 for safety/equipment;HOB elevated     General bed mobility comments: Pt required MAX A +2 with HOB elevated to scoot hips to EOB and elevate trunk. pt able to initiate advancing LLE to EOB with use of gait belt but overall  required MAX A to progress to EOB. Pt reports pain when assisting trunk needing increased time and effort to achieve upright sitting  Transfers Overall transfer level: Needs assistance Equipment used: 2 person hand held assist Transfers: Lateral/Scoot Transfers          Lateral/Scoot Transfers: Max assist;+2 physical assistance General transfer comment: MAX A +2 with use of bed paed to scoot to recliner towards R side. pt required cues for safety to maintain WB restrictions and hand placement during transfer    Balance Overall balance assessment: Needs assistance Sitting-balance support: Bilateral upper extremity supported;Feet supported Sitting balance-Leahy Scale: Fair Sitting balance - Comments: close min guard Postural control: Posterior lean                                 ADL either performed or assessed with clinical judgement   ADL Overall ADL's : Needs assistance/impaired                         Toilet Transfer: Maximal assistance;+2 for physical assistance Toilet Transfer Details (indicate cue type and reason): simulated to recliner via lateral scoot transfer with MAX A +2, cues for WB restrictions         Functional mobility during ADLs: Maximal assistance;+2 for physical assistance General ADL Comments: pt required MAX A +2 for lateral scoot transfer to recliner. Limited by generalized weakness and pain     Vision Patient Visual Report: No change from baseline     Perception     Praxis  Cognition Arousal/Alertness: Awake/alert Behavior During Therapy: WFL for tasks assessed/performed Overall Cognitive Status: Impaired/Different from baseline Area of Impairment: Memory;Following commands;Attention;Safety/judgement;Awareness                   Current Attention Level: Sustained Memory: Decreased short-term memory Following Commands: Follows multi-step commands inconsistently Safety/Judgement: Decreased awareness of  safety Awareness: Intellectual   General Comments: pt with decreased STM as pt unable to recall previous PT session. pt with poor insight into safety needing cues for WB restrictions and overall body mechanics        Exercises General Exercises - Lower Extremity Heel Slides: AROM;Right;Supine;10 reps Hip ABduction/ADduction: AAROM;10 reps;Both;Supine Straight Leg Raises: AAROM;10 reps;Both;Supine   Shoulder Instructions       General Comments VSS, pt reports nausea after getting into chair, but reports feeling better with increased time. Of note pt had just drank Ensure prior to transfer likely causing nausea- RN aware    Pertinent Vitals/ Pain       Pain Assessment: Faces Pain Score: 6  Faces Pain Scale: Hurts whole lot Pain Location: LLE and back with activity Pain Descriptors / Indicators: Aching;Guarding;Discomfort;Grimacing;Moaning Pain Intervention(s): Limited activity within patient's tolerance;Monitored during session;Repositioned  Home Living                                          Prior Functioning/Environment              Frequency  Min 2X/week        Progress Toward Goals  OT Goals(current goals can now be found in the care plan section)  Progress towards OT goals: Progressing toward goals  Acute Rehab OT Goals Patient Stated Goal: get stronger OT Goal Formulation: With patient Time For Goal Achievement: 08/15/19 Potential to Achieve Goals: Good  Plan Discharge plan remains appropriate    Co-evaluation                 AM-PAC OT "6 Clicks" Daily Activity     Outcome Measure   Help from another person eating meals?: Total Help from another person taking care of personal grooming?: A Little Help from another person toileting, which includes using toliet, bedpan, or urinal?: Total Help from another person bathing (including washing, rinsing, drying)?: A Lot Help from another person to put on and taking off regular upper  body clothing?: A Lot Help from another person to put on and taking off regular lower body clothing?: Total 6 Click Score: 10    End of Session Equipment Utilized During Treatment: Gait belt  OT Visit Diagnosis: Muscle weakness (generalized) (M62.81);Pain;Other symptoms and signs involving cognitive function;Unsteadiness on feet (R26.81) Pain - Right/Left: Left   Activity Tolerance Patient tolerated treatment well   Patient Left in chair;with call bell/phone within reach;with chair alarm set   Nurse Communication Mobility status;Need for lift equipment;Other (comment)(nauseous after transfer, transfer back to R side or use lift.)        Time: 1351-1417 OT Time Calculation (min): 26 min  Charges: OT General Charges $OT Visit: 1 Visit OT Treatments $Therapeutic Activity: 23-37 mins  Lanier Clam., COTA/L Acute Rehabilitation Services [phone]    Ihor Gully 08/08/2019, 3:48 PM

## 2019-08-09 ENCOUNTER — Ambulatory Visit: Payer: Self-pay | Admitting: Infectious Disease

## 2019-08-09 LAB — GLUCOSE, CAPILLARY
Glucose-Capillary: 148 mg/dL — ABNORMAL HIGH (ref 70–99)
Glucose-Capillary: 185 mg/dL — ABNORMAL HIGH (ref 70–99)
Glucose-Capillary: 180 mg/dL — ABNORMAL HIGH (ref 70–99)

## 2019-08-09 MED ORDER — OXYCODONE HCL 5 MG PO TABS
10.0000 mg | ORAL_TABLET | Freq: Four times a day (QID) | ORAL | Status: DC | PRN
  Administered 2019-08-09 – 2019-08-15 (×21): 10 mg via ORAL
  Filled 2019-08-09 (×21): qty 2

## 2019-08-09 MED ORDER — MORPHINE SULFATE (PF) 2 MG/ML IV SOLN
1.0000 mg | Freq: Once | INTRAVENOUS | Status: AC
  Administered 2019-08-09: 22:00:00 1 mg via INTRAVENOUS
  Filled 2019-08-09: qty 1

## 2019-08-09 MED ORDER — METOPROLOL SUCCINATE ER 100 MG PO TB24
200.0000 mg | ORAL_TABLET | Freq: Every day | ORAL | Status: DC
  Administered 2019-08-09 – 2019-09-07 (×30): 200 mg via ORAL
  Filled 2019-08-09 (×30): qty 2

## 2019-08-09 MED ORDER — DIAZEPAM 2 MG PO TABS
2.0000 mg | ORAL_TABLET | Freq: Two times a day (BID) | ORAL | Status: DC
  Administered 2019-08-09 – 2019-08-10 (×3): 2 mg via ORAL
  Filled 2019-08-09 (×3): qty 1

## 2019-08-09 NOTE — TOC Initial Note (Signed)
Transition of Care [facility]) - Initial/Assessment Note    Patient Details  Name: Vernon Frye MRN: [id number] Date of Birth: 21-May-1961  Transition of Care [facility]) CM/SW Contact:    Marilu Favre, RN Phone Number: 08/09/2019, 2:36 PM  Clinical Narrative:                  Patient from home alone. PT recommendation for SNF. Patient has no insurance and prefers to go home.    Patient believes he has friends who will assist him at home. NCM encouraged patient to call friends today to confirm.   Patient has walker and crutches at home already.   PT recommends wheel chair and hospital bed.   Spoke to [place] with [place] cost for hospital bed rental is $100/month and wheel chair rental $85/month. Patient believes he can get a wheel chair from friend.   NCM encouraged patient to work with PT as much as possible while he is in the hospital. Patient became upset and said " I can't it hurts".  NCM will check back with patient regarding if friends can assist at discharge , and if he can get hospital bed.    Barriers to Discharge: Continued Medical Work up   Patient Goals and CMS Choice   CMS Medicare.gov Compare Post Acute Care list provided to:: Patient Choice offered to / list presented to : Patient  Expected Discharge Plan and Services     Discharge Planning Services: CM Consult   Living arrangements for the past 2 months: Single Family Home                                      Prior Living Arrangements/Services Living arrangements for the past 2 months: Single Family Home Lives with:: Self   Do you feel safe going back to the place where you live?: Yes      Need for Family Participation in Patient Care: Yes (Comment) Care giver support system in place?: Yes (comment) Current home services: DME    Activities of Daily Living      Permission Sought/Granted Permission sought to share information with : Family Supports Permission granted to share  information with : No              Emotional Assessment Appearance:: Appears stated age Attitude/Demeanor/Rapport: Other (comment)(confused)   Orientation: : Oriented to Place, Oriented to Self, Oriented to  Time, Oriented to Situation Alcohol / Substance Use: Not Applicable Psych Involvement: No (comment)  Admission diagnosis:  Respiratory insufficiency [R06.89] Hypoxia [R09.02] AKI (acute kidney injury) ([place]) [N17.9] Septic joint ([place]) [M00.9] Severe sepsis with acute organ dysfunction ([place]) [A41.9, R65.20] Acute pain of left knee [M25.562] S/P revision of total knee [Z96.659] Patient Active Problem List   Diagnosis Date Noted  . Malnutrition of moderate degree 07/29/2019  . Acute respiratory failure ([place])   . 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 Acute metabolic encephalopathy 123456  . Left arm swelling 07/20/2019  . Red man syndrome 07/20/2019  . Acute bilateral low back pain without sciatica   . MRSA bacteremia 07/14/2019  . 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 Acute pain of left knee   . Tachypnea   . Septic joint ([place]) 07/13/2019  . Atrial fibrillation with rapid ventricular response ([place]) 07/13/2019  . AKI (acute kidney injury) ([place]) 07/13/2019  . Hyponatremia 07/13/2019  . Alcohol use 07/13/2019  .  Severe sepsis ([place]) 07/13/2019  . Severe sepsis with acute organ dysfunction ([place]) 07/13/2019  . Respiratory insufficiency 07/13/2019  . Hypotension   . 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 S/P knee replacement 06/19/2015  . Rotator cuff tear 01/16/2014  . HYPERTENSION 01/31/2010  . PNEUMONIA 01/31/2010  . SWELLING, MASS, OR LUMP IN CHEST 01/31/2010   PCP:  Nicholes Rough, PA-C Pharmacy:   [id number] - [place], [place] - [address] [place] [address] Phone: [phone] Fax: [phone]  [facility] [place], [place] - [address] [place] [address] Phone: [phone] Fax: [phone]     Social Determinants of Health (SDOH) Interventions    Readmission Risk Interventions No flowsheet data found.

## 2019-08-09 NOTE — Progress Notes (Signed)
[name] [facility]  PROGRESS NOTE    Vernon Frye  [id number] DOB: [birth year] DOA: 07/12/2019 PCP: Nicholes Rough, PA-C   Brief Narrative:   59 yo M presenting with left knee swelling. Found to have septic arthritis of the knee. Seen by orthopedics for left total knee arthroplasty. Required intubation. Found to have new onset a fib RVR. Seen by cardiology. Had DCCV. Improving. Transferred to [facility] 08/03/19.  08/09/19:  No acute events ON. Taper back some of his meds leading to drowsiness. IV abx through 09/06/19; need SNF, but no insurance and doesn't seem motivated; if this is the case, we need to find family to take him and see if we can set up IV abx and HHPT at home.   Assessment & Plan:   Principal Problem:   MRSA bacteremia Active Problems:   Septic joint ([place])   Atrial fibrillation with rapid ventricular response (HCC)   AKI (acute kidney injury) ([place])   Hyponatremia   Severe sepsis (HCC)   Severe sepsis with acute organ dysfunction (HCC)   Respiratory insufficiency   Acute pain of left knee   Prosthetic joint infection (HCC)   Tachypnea   Acute bilateral low back pain without sciatica   Left arm swelling   Red man syndrome   Acute metabolic encephalopathy   S/P revision of total knee   Malnutrition of moderate degree   Acute respiratory failure (HCC)  Acute Afib: - seen by cards - d/c DCCV - eliquis, metoprolol, amiodarone     - continue current tx, he is stable  MRSA bacteremia L knee septic joint - Bld Cx from 07/30/19 neg - on vanc thru 09/06/19 - TEE without vegetations.  - S/p knee replacement - Ortho signed off -Needs SNF, but has no insurance, will need to see if family can take him onboard  HTN     - metoprolol XL 200 mg qday  Kleb uti:  - without tx - was untreated and he was asymptomic - monitor for symptoms  Etoh abuse Acute encephalopathy (toxic vs metabolic)  -he has been somnolent but easily arousable;  says his sleep cycle is off, more likely that he has too many meds onboard     - taper valium and transition back to his come klonopin     - decrease oxy frequency     - tolerating seroquel  DVT prophylaxis: eliquis Code Status: FULL Family Communication: None at bedside   Disposition Plan: IV abx through 09/06/19; need SNF, but no insurance and doesn't seem motivated; if this is the case, we need to find family to take him and see if we can set up IV abx and HHPT at home.   Consultants:   Orthopedics  PCCM  Antimicrobials:  [name] Kitchen Vanc   ROS:  Denies CP, N, V, ab pain . Remainder 10-pt ROS is negative for all not previously mentioned.  Subjective: "I can't sleep."  Objective: Vitals:   08/08/19 1356 08/08/19 2019 08/08/19 2306 08/09/19 0536  BP: 126/85 133/82 136/78 135/88  Pulse: 92 89 96 98  Resp: 19 18  16   Temp: 97.6 F (36.4 C) 98.2 F (36.8 C)  97.8 F (36.6 C)  TempSrc: Oral Oral  Oral  SpO2: 98% 98%  99%  Weight:      Height:        Intake/Output Summary (Last 24 hours) at 08/09/2019 0813 Last data filed at 08/09/2019 0544 Gross per 24 hour  Intake 1640 ml  Output 6150 ml  Net -4510  ml   Filed Weights   08/03/19 0221 08/05/19 0434 08/06/19 0500  Weight: 104.8 kg 107.1 kg 109 kg    Examination:  General: 59 y.o. male resting in bed in NAD Cardiovascular: RRR, +S1, S2, no m/g/r, equal pulses throughout Respiratory: CTABL, no w/r/r, normal WOB GI: BS+, NDNT, no masses noted, no organomegaly noted MSK: No c/c; stable LLE edema Neuro: A&O x 3, follows commands Psyc: calm/cooperative   Data Reviewed: I have personally reviewed following labs and imaging studies.  CBC: Recent Labs  Lab 08/04/19 0305 08/05/19 0408 08/06/19 0433 08/07/19 0314 08/08/19 0313  WBC 9.8 11.5* 10.9* 11.3* 11.6*  NEUTROABS  --   --   --   --  7.9*  HGB 9.0* 8.5* 8.4* 8.5* 9.1*  HCT 29.2* 27.4* 27.1* 27.9* 29.4*  MCV 92.7 93.2 94.1 94.9 93.0  PLT 297 330 322 344 0000000    Basic Metabolic Panel: Recent Labs  Lab 08/04/19 0305 08/05/19 0408 08/06/19 0433 08/07/19 0314 08/08/19 0313  NA 140 137 138 136 136  K 3.6 3.7 4.1 4.4 4.7  CL 104 98 100 100 99  CO2 27 29 29 28 28   GLUCOSE 124* 148* 128* 166* 144*  BUN 17 16 13 17 16   CREATININE 0.69 0.67 0.70 0.81 0.78  CALCIUM 8.4* 8.3* 8.4* 8.5* 9.0  MG 1.6* 1.6* 1.7 1.7 1.9  PHOS  --   --   --   --  4.5   GFR: Estimated Creatinine Clearance: 134.2 mL/min (by C-G formula based on SCr of 0.78 mg/dL). Liver Function Tests: Recent Labs  Lab 08/08/19 0313  ALBUMIN 2.0*   No results for input(s): LIPASE, AMYLASE in the last 168 hours. No results for input(s): AMMONIA in the last 168 hours. Coagulation Profile: No results for input(s): INR, PROTIME in the last 168 hours. Cardiac Enzymes: No results for input(s): CKTOTAL, CKMB, CKMBINDEX, TROPONINI in the last 168 hours. BNP (last 3 results) No results for input(s): PROBNP in the last 8760 hours. HbA1C: No results for input(s): HGBA1C in the last 72 hours. CBG: Recent Labs  Lab 08/07/19 2102 08/08/19 0814 08/08/19 1149 08/08/19 1755 08/08/19 2121  GLUCAP 108* 233* 148* 168* 139*   Lipid Profile: No results for input(s): CHOL, HDL, LDLCALC, TRIG, CHOLHDL, LDLDIRECT in the last 72 hours. Thyroid Function Tests: No results for input(s): TSH, T4TOTAL, FREET4, T3FREE, THYROIDAB in the last 72 hours. Anemia Panel: No results for input(s): VITAMINB12, FOLATE, FERRITIN, TIBC, IRON, RETICCTPCT in the last 72 hours. Sepsis Labs: No results for input(s): PROCALCITON, LATICACIDVEN in the last 168 hours.  Recent Results (from the past 240 hour(s))  C difficile quick scan w PCR reflex     Status: None   Collection Time: 07/30/19 11:49 AM   Specimen: STOOL  Result Value Ref Range Status   C Diff antigen NEGATIVE NEGATIVE Final   C Diff toxin NEGATIVE NEGATIVE Final   C Diff interpretation No C. difficile detected.  Final    Comment: Performed at  [facility], [place] [address], [place], [place] [address]  Culture, Urine     Status: Abnormal   Collection Time: 07/30/19 11:49 AM   Specimen: Urine, Random  Result Value Ref Range Status   Specimen Description URINE, RANDOM  Final   Special Requests   Final    NONE Performed at [facility], [place] [address], [place], [place] [address]    Culture >=100,000 COLONIES/mL KLEBSIELLA OXYTOCA (A)  Final   Report  Status 08/01/2019 FINAL  Final   Organism ID, Bacteria KLEBSIELLA OXYTOCA (A)  Final      Susceptibility   Klebsiella oxytoca - MIC*    AMPICILLIN >=32 RESISTANT Resistant     CEFAZOLIN >=64 RESISTANT Resistant     CEFTRIAXONE <=0.25 SENSITIVE Sensitive     CIPROFLOXACIN <=0.25 SENSITIVE Sensitive     GENTAMICIN <=1 SENSITIVE Sensitive     IMIPENEM <=0.25 SENSITIVE Sensitive     NITROFURANTOIN 32 SENSITIVE Sensitive     TRIMETH/SULFA <=20 SENSITIVE Sensitive     AMPICILLIN/SULBACTAM 16 INTERMEDIATE Intermediate     PIP/TAZO <=4 SENSITIVE Sensitive     * >=100,000 COLONIES/mL KLEBSIELLA OXYTOCA  Culture, blood (routine x 2)     Status: None   Collection Time: 07/30/19 12:23 PM   Specimen: BLOOD  Result Value Ref Range Status   Specimen Description BLOOD LEFT ANTECUBITAL  Final   Special Requests   Final    BOTTLES DRAWN AEROBIC AND ANAEROBIC Blood Culture results may not be optimal due to an inadequate volume of blood received in culture bottles   Culture   Final    NO GROWTH 5 DAYS Performed at [facility], [place] [address], [place], [place] [address]    Report Status 08/04/2019 FINAL  Final  Culture, blood (routine x 2)     Status: None   Collection Time: 07/30/19 12:29 PM   Specimen: BLOOD LEFT HAND  Result Value Ref Range Status   Specimen Description BLOOD LEFT HAND  Final   Special Requests   Final    BOTTLES DRAWN AEROBIC ONLY Blood Culture adequate volume   Culture   Final    NO GROWTH 5 DAYS Performed at [facility], [place] [address], [place], [place] [address]    Report Status 08/04/2019 FINAL  Final      Radiology Studies: No results found.   Scheduled Meds: . acetaminophen  650 mg Oral Q6H  . amiodarone  200 mg Oral Daily  . apixaban  5 mg Oral BID  . chlorhexidine  15 mL Mouth Rinse BID  . Chlorhexidine Gluconate Cloth  6 each Topical Daily  . diazepam  2 mg Oral BID  . diclofenac Sodium  4 g Topical QID  . docusate sodium  100 mg Oral BID  . feeding supplement (ENSURE ENLIVE)  237 mL Oral BID BM  . folic acid  1 mg Oral Daily  . gabapentin  300 mg Oral TID  . insulin aspart  0-6 Units Subcutaneous TID AC & HS  . lisinopril  10 mg Oral Daily  . magnesium oxide  400 mg Oral BID  . mouth rinse  15 mL Mouth Rinse q12n4p  . metoprolol succinate  200 mg Oral Daily  . morphine  15 mg Oral Q12H  . multivitamin with minerals  1 tablet Oral Daily  . pantoprazole  40 mg Oral Daily  . psyllium  1 packet Oral Daily  . QUEtiapine  25 mg Oral BID  . sodium chloride flush  10-40 mL Intracatheter Q12H  . thiamine  100 mg Oral Daily   Continuous Infusions: . vancomycin 1,250 mg (08/09/19 0154)     LOS: 27 days    Time spent: 25 minutes spent in the coordination of care today.    Jonnie Finner, DO [facility]  If 7PM-7AM, please contact night-coverage [email or web address] 08/09/2019, 8:13 AM

## 2019-08-09 NOTE — Progress Notes (Signed)
Apixaban Education and Patient Assistance   Apixaban medication teaching completed and documented in Epic Education tab. Patient was very concerned about the cost.   Provided apixaban patient assistance program phone number to patient ([phone]) and explained the process. A brief review of patient's finances indicates that patient should qualify for the program, which can offer 1-2 years of medication for free. Strongly encouraged patient to call the program before discharge, he stated he would.   [email or web address].info/   FYI Pharmacy has 30 day supply of apixaban ready for patient when he is discharged.    Benetta Spar, PharmD, BCPS, BCCP Clinical Pharmacist  Please check AMION for all [place] phone numbers After 10:00 PM, call [place] [phone]

## 2019-08-09 NOTE — Progress Notes (Addendum)
Patient ID: Vernon Frye, male   DOB: May 25, 1961, 59 y.o.   MRN: [id number] Subjective: 14 days s/p resection of infected left TKR.  Extensive, guarded hospital stay, now on surgical floor.    Patient reports pain as moderate.  Has pain in the left knee with movement.  He does note weird noise with movement related to the cement on cement articulation  Objective:   VITALS:   Vitals:   08/09/19 0536 08/09/19 1504  BP: 135/88 136/72  Pulse: 98 95  Resp: 16 20  Temp: 97.8 F (36.6 C) 98.3 F (36.8 C)  SpO2: 99% 98%   Resting in bed Significant weight loss appreciated over this admission Neurovascular intact Incision: dressing C/D/I - left knee in knee immobilzer  LABS Recent Labs    08/07/19 0314 08/08/19 0313  HGB 8.5* 9.1*  HCT 27.9* 29.4*  WBC 11.3* 11.6*  PLT 344 349    Recent Labs    08/07/19 0314 08/08/19 0313  NA 136 136  K 4.4 4.7  BUN 17 16  CREATININE 0.81 0.78  GLUCOSE 166* 144*    No results for input(s): LABPT, INR in the last 72 hours.   Assessment/Plan:  14 days s/p resection of infected left TKR  Plan: Continue IV ABX per ID for a total of 6 weeks per [facility] line Continue to work with PT and OT to improve mobility/strength given current state Disposition pending above variables  Left knee - ROM encouraged but not required,  PWB LLE Will follow Continue current pain regiment

## 2019-08-09 NOTE — Progress Notes (Addendum)
Nutrition Follow-up  DOCUMENTATION CODES:   Non-severe (moderate) malnutrition in context of social or environmental circumstances  INTERVENTION:   -Continue Ensure Enlive po BID, each supplement provides 350 kcal and 20 grams of protein -Continue MVI with minerals daily  NUTRITION DIAGNOSIS:   Moderate Malnutrition related to social / environmental circumstances as evidenced by percent weight loss, mild muscle depletion.  Ongoing  GOAL:   Patient will meet greater than or equal to 90% of their needs  Progressing   MONITOR:   PO intake, Supplement acceptance, Labs, Weight trends, Skin, I & O's  REASON FOR ASSESSMENT:   Ventilator, Consult Enteral/tube feeding initiation and management  ASSESSMENT:   Patient with PMH significant for HTN, L TKR (2016), and Afib. Presents this admission with septic arthritis of L new and new onset AF with RVR.  12/16- s/p I&D total L knee  12/29- s/p total L knee revision/replacement  1/3- extubated  1/5- s/p TEE- no vegetations; successful DCCV  Reviewed I/O's: -4.5 L x 24 hours and -10.8 L since 07/26/19  UOP: 6.2 L x 24 hours  Pt receiving nursing care at of visit.   Pt with good oral intake. Noted meal completion 50-100%. Per MAR, pt has been consuming Ensure supplements.   Pt with variable oral intake and would benefit from nutrient dense supplement. One Ensure Enlive supplement provides 350 kcals, 20 grams protein, and 44-45 grams of carbohydrate vs one Glucerna shake supplement, which provides 220 kcals, 10 grams of protein, and 26 grams of carbohydrate. Given pt's hx of DM, RD will continue to monitor PO intake, CBGS, and adjust supplement regimen as appropriate.   Medications reviewed and include thiamine, magnesium oxide  Labs reviewed: CBGS: 148-233 (inpatient orders for glycemic control are 0-6 units insulin aspart TID and q HS).   Diet Order:   Diet Order            Diet Carb Modified Fluid consistency: Thin; Room  service appropriate? Yes with Assist  Diet effective now              EDUCATION NEEDS:   Not appropriate for education at this time  Skin:  Skin Assessment: Skin Integrity Issues: Skin Integrity Issues:: Incisions, Other (Comment) Incisions: L knee/L leg Other: MASD-buttocks, scrotum  Last BM:  08/07/19  Height:   Ht Readings from Last 1 Encounters:  07/13/19 6\' 3"  (1.905 m)    Weight:   Wt Readings from Last 1 Encounters:  08/06/19 109 kg    Ideal Body Weight:  89.1 kg  BMI:  Body mass index is 30.04 kg/m.  Estimated Nutritional Needs:   Kcal:  2300-2500 kcal  Protein:  115-130 grams  Fluid:  >/= 2.3 L/day    Vernon Frye, RD, LDN, [place] Registered Dietitian II Certified Diabetes Care and Education Specialist Pager: [phone] After hours Pager: [phone]

## 2019-08-10 LAB — RENAL FUNCTION PANEL
Albumin: 2.1 g/dL — ABNORMAL LOW (ref 3.5–5.0)
Anion gap: 10 (ref 5–15)
BUN: 18 mg/dL (ref 6–20)
CO2: 28 mmol/L (ref 22–32)
Calcium: 8.9 mg/dL (ref 8.9–10.3)
Chloride: 97 mmol/L — ABNORMAL LOW (ref 98–111)
Creatinine, Ser: 0.71 mg/dL (ref 0.61–1.24)
GFR calc Af Amer: >60 mL/min (ref >60)
GFR calc non Af Amer: >60 mL/min (ref >60)
Glucose, Bld: 180 mg/dL — ABNORMAL HIGH (ref 70–99)
Phosphorus: 3.5 mg/dL (ref 2.5–4.6)
Potassium: 4.4 mmol/L (ref 3.5–5.1)
Sodium: 135 mmol/L (ref 135–145)

## 2019-08-10 LAB — GLUCOSE, CAPILLARY
Glucose-Capillary: 164 mg/dL — ABNORMAL HIGH (ref 70–99)
Glucose-Capillary: 155 mg/dL — ABNORMAL HIGH (ref 70–99)
Glucose-Capillary: 191 mg/dL — ABNORMAL HIGH (ref 70–99)
Glucose-Capillary: 168 mg/dL — ABNORMAL HIGH (ref 70–99)

## 2019-08-10 LAB — CBC WITH DIFFERENTIAL/PLATELET
Abs Immature Granulocytes: 0.53 10*3/uL — ABNORMAL HIGH (ref 0.00–0.07)
Basophils Absolute: 0.1 10*3/uL (ref 0.0–0.1)
Basophils Relative: 1 %
Eosinophils Absolute: 0.1 10*3/uL (ref 0.0–0.5)
Eosinophils Relative: 1 %
HCT: 29.6 % — ABNORMAL LOW (ref 39.0–52.0)
Hemoglobin: 9 g/dL — ABNORMAL LOW (ref 13.0–17.0)
Immature Granulocytes: 5 %
Lymphocytes Relative: 17 %
Lymphs Abs: 1.9 10*3/uL (ref 0.7–4.0)
MCH: 28.5 pg (ref 26.0–34.0)
MCHC: 30.4 g/dL (ref 30.0–36.0)
MCV: 93.7 fL (ref 80.0–100.0)
Monocytes Absolute: 0.8 10*3/uL (ref 0.1–1.0)
Monocytes Relative: 7 %
Neutro Abs: 7.8 10*3/uL — ABNORMAL HIGH (ref 1.7–7.7)
Neutrophils Relative %: 69 %
Platelets: 337 10*3/uL (ref 150–400)
RBC: 3.16 MIL/uL — ABNORMAL LOW (ref 4.22–5.81)
RDW: 15.8 % — ABNORMAL HIGH (ref 11.5–15.5)
WBC: 11.2 10*3/uL — ABNORMAL HIGH (ref 4.0–10.5)
nRBC: 0 % (ref 0.0–0.2)

## 2019-08-10 LAB — MAGNESIUM: Magnesium: 1.9 mg/dL (ref 1.7–2.4)

## 2019-08-10 MED ORDER — ALPRAZOLAM 0.5 MG PO TABS
1.0000 mg | ORAL_TABLET | Freq: Every evening | ORAL | Status: DC | PRN
  Administered 2019-08-10 – 2019-09-06 (×22): 1 mg via ORAL
  Filled 2019-08-10 (×23): qty 2

## 2019-08-10 NOTE — Progress Notes (Signed)
OT Cancellation Note  Patient Details Name: Vernon Frye MRN: [id number] DOB: 1960-10-16   Cancelled Treatment:    Reason Eval/Treat Not Completed: Fatigue/lethargy limiting ability to participate;Other (comment). Pt sleeping heavily this morning and now pt is refusing therapy until he speaks with his MD.  Will check back as schedule permits.   [name] [facility] 08/10/2019, 12:01 PM

## 2019-08-10 NOTE — Progress Notes (Signed)
PROGRESS NOTE    Vernon Frye  [id number] DOB: 09-12-1960 DOA: 07/12/2019 PCP: Nicholes Rough, PA-C    Brief Narrative:  59 yo M presenting with left knee swelling. Found to have septic arthritis of the knee. Seen by orthopedics for left total knee arthroplasty. Required intubation. Found to have new onset a fib RVR. Seen by cardiology. Had DCCV. Improving. Transferred to [facility] 08/03/19.  08/10/19:  No acute events ON. Taper back some of his meds leading to drowsiness. IV abx through 09/06/19; need SNF, but no insurance and doesn't seem motivated; if this is the case, we need to find family to take him and see if we can set up IV abx and HHPT at home.  Assessment & Plan:   Principal Problem:   MRSA bacteremia Active Problems:   Septic joint ([place])   Atrial fibrillation with rapid ventricular response (HCC)   AKI (acute kidney injury) ([place])   Hyponatremia   Severe sepsis (HCC)   Severe sepsis with acute organ dysfunction (HCC)   Respiratory insufficiency   Acute pain of left knee   Prosthetic joint infection (HCC)   Tachypnea   Acute bilateral low back pain without sciatica   Left arm swelling   Red man syndrome   Acute metabolic encephalopathy   S/P revision of total knee   Malnutrition of moderate degree   Acute respiratory failure (HCC)  Acute Afib: - seen by cards - eliquis, metoprolol, amiodarone - continue current tx, he is stable  MRSA bacteremia L knee septic joint - Bld Cx from 07/30/19 neg - on vanc thru 09/06/19 - TEE without vegetations.  - S/p knee replacement - Ortho signed off -Needs SNF, but has no insurance, will need to see if family can take him onboard  HTN     - metoprolol XL 200 mg qday  Kleb uti:  - without tx - was untreated and he was asymptomic - monitor for symptoms  Etoh abuse Acute encephalopathy (toxic vs metabolic)  -he has been somnolent but easily arousable; says his sleep  cycle is off, more likely that he has too many meds onboard     - taper valium and transition back to his come klonopin     - decrease oxy frequency     - tolerating seroquel  DVT prophylaxis: Eliquis Code Status:Full Family Communication: none at bedside Disposition Plan: pending ability to do IV antibiotics at home, needs SNF but no insurance   Consultants:   Orthopedics  ID  Cardiology  Procedures:   Knee replacement left  Antimicrobials:   Vancomycin stop date 09/06/19   Subjective: Sleepy, having some pain in the knee. Denies other concerns.   Objective: Vitals:   08/09/19 0536 08/09/19 1504 08/09/19 1956 08/10/19 0348  BP: 135/88 136/72 128/76 132/88  Pulse: 98 95 90 86  Resp: 16 20 19 18   Temp: 97.8 F (36.6 C) 98.3 F (36.8 C) 98.2 F (36.8 C) 98.5 F (36.9 C)  TempSrc: Oral Oral Oral Oral  SpO2: 99% 98% 100% 98%  Weight:      Height:        Intake/Output Summary (Last 24 hours) at 08/10/2019 1044 Last data filed at 08/10/2019 0700 Gross per 24 hour  Intake 10 ml  Output 2100 ml  Net -2090 ml   Filed Weights   08/03/19 0221 08/05/19 0434 08/06/19 0500  Weight: 104.8 kg 107.1 kg 109 kg    Examination:  General exam: Appears sleepy   Respiratory system: Clear to  auscultation. Respiratory effort normal. Cardiovascular system: S1 & S2 heard, RRR. No JVD, murmurs, rubs, gallops or clicks. No pedal edema. Gastrointestinal system: Abdomen is nondistended, soft and nontender. No organomegaly or masses felt. Normal bowel sounds heard. Central nervous system: Alert and oriented. No focal neurological deficits. Extremities: Symmetric, left knee wrapped, not examined Skin: No rashes, lesions or ulcers Psychiatry: Judgement and insight unable to assess due to sleepiness  Data Reviewed: I have personally reviewed following labs and imaging studies  CBC: Recent Labs  Lab 08/05/19 0408 08/06/19 0433 08/07/19 0314 08/08/19 0313 08/10/19 0352  WBC  11.5* 10.9* 11.3* 11.6* 11.2*  NEUTROABS  --   --   --  7.9* 7.8*  HGB 8.5* 8.4* 8.5* 9.1* 9.0*  HCT 27.4* 27.1* 27.9* 29.4* 29.6*  MCV 93.2 94.1 94.9 93.0 93.7  PLT 330 322 344 349 [id number]   Basic Metabolic Panel: Recent Labs  Lab 08/05/19 0408 08/06/19 0433 08/07/19 0314 08/08/19 0313 08/10/19 0352  NA 137 138 136 136 135  K 3.7 4.1 4.4 4.7 4.4  CL 98 100 100 99 97*  CO2 29 29 28 28 28   GLUCOSE 148* 128* 166* 144* 180*  BUN 16 13 17 16 18   CREATININE 0.67 0.70 0.81 0.78 0.71  CALCIUM 8.3* 8.4* 8.5* 9.0 8.9  MG 1.6* 1.7 1.7 1.9 1.9  PHOS  --   --   --  4.5 3.5   GFR: Estimated Creatinine Clearance: 134.2 mL/min (by C-G formula based on SCr of 0.71 mg/dL). Liver Function Tests: Recent Labs  Lab 08/08/19 0313 08/10/19 0352  ALBUMIN 2.0* 2.1*   No results for input(s): LIPASE, AMYLASE in the last 168 hours. No results for input(s): AMMONIA in the last 168 hours. Coagulation Profile: No results for input(s): INR, PROTIME in the last 168 hours. Cardiac Enzymes: No results for input(s): CKTOTAL, CKMB, CKMBINDEX, TROPONINI in the last 168 hours. BNP (last 3 results) No results for input(s): PROBNP in the last 8760 hours. HbA1C: No results for input(s): HGBA1C in the last 72 hours. CBG: Recent Labs  Lab 08/08/19 2121 08/09/19 1210 08/09/19 1633 08/09/19 2108 08/10/19 0831  GLUCAP 139* 148* 185* 180* 164*   Lipid Profile: No results for input(s): CHOL, HDL, LDLCALC, TRIG, CHOLHDL, LDLDIRECT in the last 72 hours. Thyroid Function Tests: No results for input(s): TSH, T4TOTAL, FREET4, T3FREE, THYROIDAB in the last 72 hours. Anemia Panel: No results for input(s): VITAMINB12, FOLATE, FERRITIN, TIBC, IRON, RETICCTPCT in the last 72 hours. Sepsis Labs: No results for input(s): PROCALCITON, LATICACIDVEN in the last 168 hours.  No results found for this or any previous visit (from the past 240 hour(s)).    Radiology Studies: No results found.   Scheduled Meds: .  acetaminophen  650 mg Oral Q6H  . amiodarone  200 mg Oral Daily  . apixaban  5 mg Oral BID  . chlorhexidine  15 mL Mouth Rinse BID  . Chlorhexidine Gluconate Cloth  6 each Topical Daily  . diazepam  2 mg Oral BID  . docusate sodium  100 mg Oral BID  . feeding supplement (ENSURE ENLIVE)  237 mL Oral BID BM  . folic acid  1 mg Oral Daily  . gabapentin  300 mg Oral TID  . insulin aspart  0-6 Units Subcutaneous TID AC & HS  . lisinopril  10 mg Oral Daily  . magnesium oxide  400 mg Oral BID  . mouth rinse  15 mL Mouth Rinse q12n4p  . metoprolol succinate  200  mg Oral Daily  . morphine  15 mg Oral Q12H  . multivitamin with minerals  1 tablet Oral Daily  . pantoprazole  40 mg Oral Daily  . psyllium  1 packet Oral Daily  . QUEtiapine  25 mg Oral BID  . sodium chloride flush  10-40 mL Intracatheter Q12H  . thiamine  100 mg Oral Daily   Continuous Infusions: . vancomycin 1,250 mg (08/10/19 0326)     LOS: 28 days   Time spent: 25  Hoyt Koch, MD [facility] Pager [phone]  If 7PM-7AM, please contact night-coverage [email or web address] Password [facility] 08/10/2019, 10:44 AM

## 2019-08-10 NOTE — Progress Notes (Signed)
PT Cancellation Note  Patient Details Name: Vernon Frye MRN: [id number] DOB: April 21, 1961   Cancelled Treatment:    Reason Eval/Treat Not Completed: Other (comment) Spoke with pt's nurse who stated that pt is refusing therapy until he speaks with his MD. Nurse has paged ortho MD.  Will check back as schedule permits.   Galen Manila 08/10/2019, 11:49 AM

## 2019-08-10 NOTE — Progress Notes (Signed)
Pharmacy Antibiotic Note  Vernon Frye is a 59 y.o. male admitted on 07/12/2019 with MRSA bacteremia and prosthetic joint infection. Pharmacy has been consulted for vancomycin dosing. Pt on extended infusion time (4hrs) to alleviate Red Man Syndrome; no adverse effects reported since this was begun. TEE negative for vegetations on 1/5, Cr stable, OPAT orders placed.  Scr stable - repeat levels due 1/17  Plan: -Continue vancomycin 1250mg  IV q12h  -Monitor clinical progress, c/s, renal function and vanc levels as needed -ID planning vancomycin continuing through 09/06/19   Height: 6\' 3"  (190.5 cm) Weight: 240 lb 4.8 oz (109 kg) IBW/kg (Calculated) : 84.5  Temp (24hrs), Avg:98.3 F (36.8 C), Min:98.2 F (36.8 C), Max:98.5 F (36.9 C)  Recent Labs  Lab 08/05/19 0408 08/06/19 0433 08/07/19 0314 08/07/19 0915 08/08/19 0313 08/10/19 0352  WBC 11.5* 10.9* 11.3*  --  11.6* 11.2*  CREATININE 0.67 0.70 0.81  --  0.78 0.71  VANCOTROUGH  --   --  14*  --   --   --   VANCOPEAK  --   --   --  28*  --   --     Estimated Creatinine Clearance: 134.2 mL/min (by C-G formula based on SCr of 0.71 mg/dL).    Allergies  Allergen Reactions  . Vancomycin Shortness Of Breath and Rash    [name]    Antimicrobials: Vanc 12/22>>(09/06/19) **Vanc 12/16 x1 - only received 1/2 dose d/t red man's syndrome, MD said d/c, then resume vanc Ceftriaxone 12/16 x1 Zosyn 12/16x1 Dapto 12/16>>12/22 - CK qWed - 126 (BL, 12/17)  12/25: VT 11, AUC 356, switched to vanc IV 1500 Q12 (over 4h d/t Red man, AUC 530) 12/28 VP = 28; trough 17 (giving over 4 hrs- but can use for kinetics)- AUC on 1500 q 12 = 591 - reduce to 1250 q 12 for AUC 492 12/31 VP 20, VT 13 - AUC 456 (giving over 4hrs) - cont 1250 q12h 1/3 VR 19, VT 15 - AUC 455 (giving over 4 hrs) - cont 1250 q12h 1/10 VT 14, VP 28 - cont 1250mg  q12h  Microbiology: 12/16 BCx 2/2 mrsa 12/16L knee fluid: abundant MRSA 12/16 Ucx: neg 12/16 Grp A  strep - neg 12/17 BCx >>1/2 SA 12/18 BCx >> ngF 1/2 Cdiff >> neg 1/2 UCx >>  >100k GNR 1/2 BCx >> ngtd  Thank you for the interesting consult and for involving pharmacy in this patient's care.  Alanda Slim, PharmD, [facility] Clinical Pharmacist Please see AMION for all Pharmacists' Contact Phone Numbers 08/10/2019, 8:47 AM

## 2019-08-11 LAB — CBC
HCT: 29.9 % — ABNORMAL LOW (ref 39.0–52.0)
Hemoglobin: 9.3 g/dL — ABNORMAL LOW (ref 13.0–17.0)
MCH: 29 pg (ref 26.0–34.0)
MCHC: 31.1 g/dL (ref 30.0–36.0)
MCV: 93.1 fL (ref 80.0–100.0)
Platelets: 325 10*3/uL (ref 150–400)
RBC: 3.21 MIL/uL — ABNORMAL LOW (ref 4.22–5.81)
RDW: 15.4 % (ref 11.5–15.5)
WBC: 10.8 10*3/uL — ABNORMAL HIGH (ref 4.0–10.5)
nRBC: 0 % (ref 0.0–0.2)

## 2019-08-11 LAB — BASIC METABOLIC PANEL
Anion gap: 10 (ref 5–15)
BUN: 18 mg/dL (ref 6–20)
CO2: 28 mmol/L (ref 22–32)
Calcium: 9 mg/dL (ref 8.9–10.3)
Chloride: 98 mmol/L (ref 98–111)
Creatinine, Ser: 0.86 mg/dL (ref 0.61–1.24)
GFR calc Af Amer: >60 mL/min (ref >60)
GFR calc non Af Amer: >60 mL/min (ref >60)
Glucose, Bld: 193 mg/dL — ABNORMAL HIGH (ref 70–99)
Potassium: 4.4 mmol/L (ref 3.5–5.1)
Sodium: 136 mmol/L (ref 135–145)

## 2019-08-11 LAB — GLUCOSE, CAPILLARY
Glucose-Capillary: 196 mg/dL — ABNORMAL HIGH (ref 70–99)
Glucose-Capillary: 203 mg/dL — ABNORMAL HIGH (ref 70–99)
Glucose-Capillary: 165 mg/dL — ABNORMAL HIGH (ref 70–99)
Glucose-Capillary: 170 mg/dL — ABNORMAL HIGH (ref 70–99)

## 2019-08-11 NOTE — Progress Notes (Signed)
PT Cancellation Note  Patient Details Name: Vernon Frye MRN: [id number] DOB: 19-Nov-1960   Cancelled Treatment:    Reason Eval/Treat Not Completed: Other (comment). Pt refusing treatment until he can speak with Dr.Olin. Will check back as time allows.    [name], PTA Pager [phone] Acute Rehab   Allena Katz 08/11/2019, 2:28 PM

## 2019-08-11 NOTE — Plan of Care (Signed)

## 2019-08-11 NOTE — Plan of Care (Signed)
  Problem: Education: Goal: Knowledge of General Education information will improve Description: Including pain rating scale, medication(s)/side effects and non-pharmacologic comfort measures Outcome: Progressing   Problem: Clinical Measurements: Goal: Ability to maintain clinical measurements within normal limits will improve Outcome: Progressing Goal: Will remain free from infection Outcome: Progressing Goal: Diagnostic test results will improve Outcome: Progressing Goal: Cardiovascular complication will be avoided Outcome: Progressing   

## 2019-08-11 NOTE — Brief Op Note (Signed)
07/26/2019  12:43 PM  PATIENT:  Vernon Frye  59 y.o. male  PRE-OPERATIVE DIAGNOSIS:  infected left total knee arthroplasty  POST-OPERATIVE DIAGNOSIS:  infected left total knee arthroplasty  PROCEDURE:  Procedure(s): TOTAL KNEE REVISION removal of left total knee replacement with placement of antibiotic spacers (Left)  SURGEON:  Surgeon(s) and Role:    Paralee Cancel, MD - Primary  PHYSICIAN ASSISTANT: Danae Orleans, PA-C  ANESTHESIA:   general  EBL:  200 mL   BLOOD ADMINISTERED:none  DRAINS: none   LOCAL MEDICATIONS USED:  MARCAINE     SPECIMEN:  Source of Specimen:  left knee fluid and tissue  DISPOSITION OF SPECIMEN:  PATHOLOGY  COUNTS:  YES  TOURNIQUET:   Total Tourniquet Time Documented: Thigh (Left) - 81 minutes Total: Thigh (Left) - 81 minutes   DICTATION:  [id number]  PLAN OF CARE: Admit to inpatient   PATIENT DISPOSITION:  PACU - guarded condition.   Delay start of Pharmacological VTE agent (>24hrs) due to surgical blood loss or risk of bleeding: no

## 2019-08-11 NOTE — Progress Notes (Signed)
Dressing changed as ordered. Pt tolerated well. Will continue to monitor.

## 2019-08-11 NOTE — Op Note (Signed)
NAME: Vernon Frye, Vernon Frye MEDICAL RECORD [id number] ACCOUNT [id number] DATE OF BIRTH:1960/08/20 FACILITY: [facility] LOCATION: [facility] PHYSICIAN:Ricahrd Schwager D. 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   PROCEDURE:  Resection of infected left total knee replacement and placement of an antibiotic spacer.  The spacer was a cement block with 3 grams of vancomycin and 3.6 of tobramycin.  SURGEON:  Paralee Cancel, MD  ASSISTANT:  Danae Orleans PA-C.  Note that Ms. Babish was present for the entire case from preoperative position, perioperative management of the operative extremity, general facilitation of the case and primary wound closure.  ANESTHESIA:  General.  ESTIMATED BLOOD LOSS:  About 200 mL after the tourniquet was let down.  DRAINS:  None.  SPECIMENS:  I did send joint fluid and tissue for pathology evaluation for Gram stain and culture.  TOURNIQUET:  Up for 81 minutes at 250 mmHg.  INDICATIONS:  The patient is a 59 year old male with history of a left total knee replacement approximately 4-5 years ago.  He has been in his normal state of health until about a week ago when he presented to the emergency room in a septic condition.   He was found to have MRSA positive bacteremia as well as an infected left total knee replacement.  Due to his health and his initial evaluation and illness he was taken to the operating room for an I and D to decompress the infection and poly exchange.   He was placed on IV antibiotics per infectious disease.  He was seen and evaluated in the hospital in the postoperative period, was noted to have persistent drainage from his wound with concern for  persistent infection.  His medical condition was not  improving.  There were several issues going on including his bacteremia as well as withdrawal from medication.  He was taken back to the operating room under an urgent/emergent basis based on lack of social and/or family availability of power of attorney  and consent.  This was confirmed with the medical team.  The consent was obtained for this reason.    PROCEDURE IN DETAIL:  The patient was brought to the operative theater.  Once adequate anesthesia was established he had already been on perioperative antibiotics to treat infection.  The left lower extremity was prepped and draped in sterile fashion.  A  timeout was performed identifying the patient, the planned procedure and extremity.  Leg was exsanguinated, tourniquet elevated to 250 mmHg.  His incision was incised.  Soft tissue planes easily created, noting an infection within this prepatellar area.   We then made an arthrotomy again encountering more bloody purulent type looking material.  Once the knee was exposed, I used a thin ACL saw and undermined the bone cement interface on the tibia and the femur.  I was able to remove the femoral and  tibial components at this point without significant bone loss.  Remaining cement was removed.  Further debridement around the posterior aspect of the knee and around the bone was carried out.  We then opened up the canals with reamers and then irrigated  with pulse lavage irrigator.  The patella button was removed as well.  The knee was copiously irrigated at this point with 3 liters normal saline solution  including the canals.  We then placed 800 mL of Irrisept fluid into the knee and let it sit for 5  minutes while we made the cement blocks.  Once the cement spacer had been made, it was placed into the knee.  We reirrigated the knee while the cement fully cured.  Once the knee had fully cured we reapproximated the extension mechanism with #1 Vicryl  and  Stratafix suture.  The remaining wound was closed with 2-0 Vicryl and a running Monocryl stitch.  Note that the knee had been irrigated with a total of 6 liters normal saline solution in addition to the Irrisept.  The knee was then cleaned, dried and dressed sterilely using surgical glue and Aquacel dressing.  The patient was then brought to the  recovery room with an Ace wrap and a knee immobilizer in place for stability.  He was transferred to the recovery room intubated in guarded condition.  He was returned to the intensive care unit for observation and for vent weaning.  He will remain on  antibiotics for 2 weeks restarting the day of this procedure and upon consultation with infectious disease.  CN/NUANCE  D:08/11/2019 T:08/11/2019 JOB:[id number]

## 2019-08-11 NOTE — Progress Notes (Signed)
PROGRESS NOTE    Vernon Frye  [id number] DOB: [birth year] DOA: 07/12/2019 PCP: Nicholes Rough, PA-C   Brief Narrative:  59 yo M presenting with left knee swelling. Found to have septic arthritis of the knee. Seen by orthopedics for left total knee arthroplasty. Required intubation. Found to have new onset a fib RVR. Seen by cardiology. Had DCCV. Improving. Transferred to [facility] 08/03/19.  08/11/19:No acute events ON. Taper back some of his meds leading to drowsiness. IV abx through 09/06/19; need SNF, but no insurance and doesn't seem motivated; per case manager he will need to stay inpatient through 09/06/19 since he does not have support.   Assessment & Plan:   Principal Problem:   MRSA bacteremia Active Problems:   Septic joint ([place])   Atrial fibrillation with rapid ventricular response (HCC)   AKI (acute kidney injury) ([place])   Hyponatremia   Severe sepsis (HCC)   Severe sepsis with acute organ dysfunction (HCC)   Respiratory insufficiency   Acute pain of left knee   Prosthetic joint infection (HCC)   Tachypnea   Acute bilateral low back pain without sciatica   Left arm swelling   Red man syndrome   Acute metabolic encephalopathy   S/P revision of total knee   Malnutrition of moderate degree   Acute respiratory failure (HCC)  Acute Afib: - seen by cards - eliquis, metoprolol, amiodarone - continue current tx, he is stable  MRSA bacteremia L knee septic joint - Bld Cx from 07/30/19 neg - on vanc thru2/9/21 - TEE without vegetations.  - S/p knee replacement - called ortho today as patient has concerns about his knee -Needs SNF, but has no insurance, may need to stay through IV course  HTN - metoprolol XL 200 mg qday  Kleb uti:  - without tx - was untreated and he was asymptomic - monitor for symptoms  Etoh abuse Acute encephalopathy (toxic vs metabolic)  -he is more awake today with changes  yesterday - back on home xanax 1 mg qhs only - morphine MS 15 mg BID    - oxycodone q 6 hr prn breakthrough pain - tolerating seroquel  DVT prophylaxis: Eliquis Code Status: Full Family Communication: patient only Disposition Plan: likely 09/06/19 due to social situation  Consultants:   Orthopedics  ID  Cardiology   Procedures:   Left knee replacement  Antimicrobials:   Vancomycin stop date 09/06/19  Subjective: Wanting to talk to [name] about his knee, having pain in it and he feels he should be better by now. Sleeping okay. Denies chest pains, abdominal pain. Good BM yesterday. No nausea or vomiting. Appetite okay.   Objective: Vitals:   08/10/19 1444 08/10/19 2157 08/11/19 0525 08/11/19 0600  BP: 121/72 127/76 119/75   Pulse: 81 87 87   Resp: 18     Temp: 98.3 F (36.8 C) 97.9 F (36.6 C) 98.2 F (36.8 C)   TempSrc: Oral Oral Oral   SpO2: 93% 95% 96%   Weight:    103.8 kg  Height:        Intake/Output Summary (Last 24 hours) at 08/11/2019 0852 Last data filed at 08/11/2019 0500 Gross per 24 hour  Intake 1200 ml  Output 4750 ml  Net -3550 ml   Filed Weights   08/05/19 0434 08/06/19 0500 08/11/19 0600  Weight: 107.1 kg 109 kg 103.8 kg    Examination:  General exam: Appears calm and comfortable  Respiratory system: Clear to auscultation. Respiratory effort normal. Cardiovascular system: S1 &  S2 heard, RRR. No JVD, murmurs, rubs, gallops or clicks. No pedal edema. Gastrointestinal system: Abdomen is nondistended, soft and nontender. No organomegaly or masses felt. Normal bowel sounds heard. Central nervous system: Alert and oriented. No focal neurological deficits. Extremities: Symmetric 5 x 5 power. Left knee wrapped and tender with moving at all Skin: No rashes, lesions or ulcers Psychiatry: Judgement and insight appear normal. Mood & affect appropriate.   Data Reviewed: I have personally reviewed following labs and imaging  studies  CBC: Recent Labs  Lab 08/06/19 0433 08/07/19 0314 08/08/19 0313 08/10/19 0352 08/11/19 0332  WBC 10.9* 11.3* 11.6* 11.2* 10.8*  NEUTROABS  --   --  7.9* 7.8*  --   HGB 8.4* 8.5* 9.1* 9.0* 9.3*  HCT 27.1* 27.9* 29.4* 29.6* 29.9*  MCV 94.1 94.9 93.0 93.7 93.1  PLT 322 344 349 337 [id number]   Basic Metabolic Panel: Recent Labs  Lab 08/05/19 0408 08/05/19 0408 08/06/19 0433 08/07/19 0314 08/08/19 0313 08/10/19 0352 08/11/19 0332  NA 137   < > 138 136 136 135 136  K 3.7   < > 4.1 4.4 4.7 4.4 4.4  CL 98   < > 100 100 99 97* 98  CO2 29   < > 29 28 28 28 28   GLUCOSE 148*   < > 128* 166* 144* 180* 193*  BUN 16   < > 13 17 16 18 18   CREATININE 0.67   < > 0.70 0.81 0.78 0.71 0.86  CALCIUM 8.3*   < > 8.4* 8.5* 9.0 8.9 9.0  MG 1.6*  --  1.7 1.7 1.9 1.9  --   PHOS  --   --   --   --  4.5 3.5  --    < > = values in this interval not displayed.   GFR: Estimated Creatinine Clearance: 122.1 mL/min (by C-G formula based on SCr of 0.86 mg/dL). Liver Function Tests: Recent Labs  Lab 08/08/19 0313 08/10/19 0352  ALBUMIN 2.0* 2.1*   No results for input(s): LIPASE, AMYLASE in the last 168 hours. No results for input(s): AMMONIA in the last 168 hours. Coagulation Profile: No results for input(s): INR, PROTIME in the last 168 hours. Cardiac Enzymes: No results for input(s): CKTOTAL, CKMB, CKMBINDEX, TROPONINI in the last 168 hours. BNP (last 3 results) No results for input(s): PROBNP in the last 8760 hours. HbA1C: No results for input(s): HGBA1C in the last 72 hours. CBG: Recent Labs  Lab 08/10/19 0831 08/10/19 1130 08/10/19 1724 08/10/19 2201 08/11/19 0738  GLUCAP 164* 155* 191* 168* 196*   Lipid Profile: No results for input(s): CHOL, HDL, LDLCALC, TRIG, CHOLHDL, LDLDIRECT in the last 72 hours. Thyroid Function Tests: No results for input(s): TSH, T4TOTAL, FREET4, T3FREE, THYROIDAB in the last 72 hours. Anemia Panel: No results for input(s): VITAMINB12, FOLATE,  FERRITIN, TIBC, IRON, RETICCTPCT in the last 72 hours. Sepsis Labs: No results for input(s): PROCALCITON, LATICACIDVEN in the last 168 hours.  No results found for this or any previous visit (from the past 240 hour(s)).   Radiology Studies: No results found.  Scheduled Meds: . acetaminophen  650 mg Oral Q6H  . amiodarone  200 mg Oral Daily  . apixaban  5 mg Oral BID  . chlorhexidine  15 mL Mouth Rinse BID  . Chlorhexidine Gluconate Cloth  6 each Topical Daily  . docusate sodium  100 mg Oral BID  . feeding supplement (ENSURE ENLIVE)  237 mL Oral BID BM  . folic acid  1 mg Oral Daily  . gabapentin  300 mg Oral TID  . insulin aspart  0-6 Units Subcutaneous TID AC & HS  . lisinopril  10 mg Oral Daily  . magnesium oxide  400 mg Oral BID  . mouth rinse  15 mL Mouth Rinse q12n4p  . metoprolol succinate  200 mg Oral Daily  . morphine  15 mg Oral Q12H  . multivitamin with minerals  1 tablet Oral Daily  . pantoprazole  40 mg Oral Daily  . psyllium  1 packet Oral Daily  . QUEtiapine  25 mg Oral BID  . sodium chloride flush  10-40 mL Intracatheter Q12H  . thiamine  100 mg Oral Daily   Continuous Infusions: . vancomycin 1,250 mg (08/11/19 0244)     LOS: 29 days   Time spent: [place], MD [facility] Pager [phone]  If 7PM-7AM, please contact night-coverage [email or web address] Password [facility] 08/11/2019, 8:52 AM

## 2019-08-12 ENCOUNTER — Telehealth: Payer: Self-pay | Admitting: Internal Medicine

## 2019-08-12 LAB — CBC
HCT: 30.8 % — ABNORMAL LOW (ref 39.0–52.0)
Hemoglobin: 9.4 g/dL — ABNORMAL LOW (ref 13.0–17.0)
MCH: 28.7 pg (ref 26.0–34.0)
MCHC: 30.5 g/dL (ref 30.0–36.0)
MCV: 93.9 fL (ref 80.0–100.0)
Platelets: 342 10*3/uL (ref 150–400)
RBC: 3.28 MIL/uL — ABNORMAL LOW (ref 4.22–5.81)
RDW: 15.6 % — ABNORMAL HIGH (ref 11.5–15.5)
WBC: 10.4 10*3/uL (ref 4.0–10.5)
nRBC: 0 % (ref 0.0–0.2)

## 2019-08-12 LAB — GLUCOSE, CAPILLARY
Glucose-Capillary: 123 mg/dL — ABNORMAL HIGH (ref 70–99)
Glucose-Capillary: 144 mg/dL — ABNORMAL HIGH (ref 70–99)
Glucose-Capillary: 176 mg/dL — ABNORMAL HIGH (ref 70–99)
Glucose-Capillary: 162 mg/dL — ABNORMAL HIGH (ref 70–99)

## 2019-08-12 MED ORDER — MORPHINE SULFATE (PF) 4 MG/ML IV SOLN
4.0000 mg | Freq: Once | INTRAVENOUS | Status: AC
  Administered 2019-08-12: 13:00:00 4 mg via INTRAVENOUS
  Filled 2019-08-12: qty 1

## 2019-08-12 NOTE — Plan of Care (Signed)
  Problem: Education: Goal: Knowledge of General Education information will improve Description: Including pain rating scale, medication(s)/side effects and non-pharmacologic comfort measures Outcome: Progressing   Problem: Health Behavior/Discharge Planning: Goal: Ability to manage health-related needs will improve Outcome: Progressing   Problem: Clinical Measurements: Goal: Ability to maintain clinical measurements within normal limits will improve Outcome: Progressing Goal: Diagnostic test results will improve Outcome: Progressing   Problem: Activity: Goal: Risk for activity intolerance will decrease Outcome: Progressing   Problem: Nutrition: Goal: Adequate nutrition will be maintained Outcome: Progressing   Problem: Elimination: Goal: Will not experience complications related to bowel motility Outcome: Progressing Goal: Will not experience complications related to urinary retention Outcome: Progressing   Problem: Pain Managment: Goal: General experience of comfort will improve Outcome: Progressing   Problem: Safety: Goal: Ability to remain free from injury will improve Outcome: Progressing   Problem: Skin Integrity: Goal: Risk for impaired skin integrity will decrease Outcome: Progressing   

## 2019-08-12 NOTE — Progress Notes (Signed)
PT Cancellation Note  Patient Details Name: Vernon Frye MRN: [id number] DOB: 1960-11-27   Cancelled Treatment:    Reason Eval/Treat Not Completed: Other (comment). Pt continues to decline stating that he will not mobilize until he speaks with [name]. Spoke at length with pt about his concerns and he believes something is wrong with his LE and would like to have it evaluated before participating with PT. Pt was not in [place] on arrival to room and was agreeable to having it placed before PTA left the room. I attempted to contact [name] yesterday via secure chat. I will attempt to reach him again today so that we can work with pt.   [name], PTA Pager [phone] Acute Rehab  Allena Katz 08/12/2019, 11:50 AM

## 2019-08-12 NOTE — Telephone Encounter (Signed)
[name] called stating that he had received a message from Dr. Sharlet Salina to follow up with patient.   [name] states that he has spoken with the patient and believes patient needs to be treated by pain management.

## 2019-08-12 NOTE — Progress Notes (Signed)
PROGRESS NOTE    Vernon Frye  [id number] DOB: 12/22/1960 DOA: 07/12/2019 PCP: Nicholes Rough, PA-C   Brief Narrative:  59 yo M presenting with left knee swelling. Found to have septic arthritis of the knee. Seen by orthopedics for left total knee arthroplasty. Required intubation. Found to have new onset a fib RVR. Seen by cardiology. Had DCCV. Improving. Transferred to [facility] 08/03/19.  08/12/19:No acute events ON. Continue to monitor some of his meds leading to drowsiness. IV abx through 09/06/19; need SNF, but no insurance and doesn't seem motivated; per case manager he will need to stay inpatient through 09/06/19 since he does not have support to go home and do PICC   Assessment & Plan:   Principal Problem:   MRSA bacteremia Active Problems:   Septic joint ([place])   Atrial fibrillation with rapid ventricular response ([place])   AKI (acute kidney injury) ([place])   Hyponatremia   Severe sepsis ([place])   Severe sepsis with acute organ dysfunction ([place])   Respiratory insufficiency   Acute pain of left knee   Prosthetic joint infection ([place])   Tachypnea   Acute bilateral low back pain without sciatica   Left arm swelling   Red man syndrome   Acute metabolic encephalopathy   S/P revision of total knee   Malnutrition of moderate degree   Acute respiratory failure (HCC)  Acute Afib: - seen by cards - eliquis, metoprolol, amiodarone - continue current tx, he is stable  MRSA bacteremia L knee septic joint - Bld Cx from 07/30/19 neg - on vanc thru2/9/21 - TEE without vegetations.  - S/p knee replacement - called ortho 08/11/19 as patient has concerns and wants to talk to [name] but patient states he did not come by, no new notes in chart from orthopedics -Needs SNF, but has no insurance, may likely need to stay through IV course 09/06/19  HTN - metoprolol XL 200 mg qday  Etoh abuse Acute encephalopathy (toxic vs metabolic)  -he is  more awake today  - back on home xanax 1 mg qhs only - morphine MS 15 mg BID    - oxycodone q 6 hr prn breakthrough pain - tolerating seroquel  DVT prophylaxis: Eliquis Code Status: Full Family Communication: patient only Disposition Plan: 09/06/19 due to need for inpatient IV antibiotics  Consultants:   Orthopedics  Procedures:   L knee replacement   Antimicrobials:   Vancomycin stop date 09/06/19   Subjective: Still having a lot of pain in the left knee at any movement. Has not been up in chair due to pain. Not working much with PT/OT. Other than knee feels well. Denies chest pains, SOB. Denies abdominal pain, n/v/d. Having regular BMs.  Objective: Vitals:   08/11/19 0600 08/11/19 1310 08/11/19 2211 08/12/19 0436  BP:  127/82 124/79 122/77  Pulse:  85 92 80  Resp:  18 20 18   Temp:  98.3 F (36.8 C) 98.7 F (37.1 C) 98.1 F (36.7 C)  TempSrc:  Oral Oral Oral  SpO2:  99% 100% 95%  Weight: 103.8 kg     Height:        Intake/Output Summary (Last 24 hours) at 08/12/2019 1108 Last data filed at 08/12/2019 0838 Gross per 24 hour  Intake 1320 ml  Output 4700 ml  Net -3380 ml   Filed Weights   08/05/19 0434 08/06/19 0500 08/11/19 0600  Weight: 107.1 kg 109 kg 103.8 kg    Examination:  General exam: Appears calm and comfortable, sleeping  on entry Respiratory system: Clear to auscultation. Respiratory effort normal. Cardiovascular system: S1 & S2 heard, RRR. No JVD, murmurs, rubs, gallops or clicks. No pedal edema. Gastrointestinal system: Abdomen is nondistended, soft and nontender. No organomegaly or masses felt. Normal bowel sounds heard. Central nervous system: Alert and oriented. No focal neurological deficits. Extremities: left knee with staples in place, no purulent drainage examined, right leg normal Skin: No rashes, lesions or ulcers Psychiatry: Judgement and insight appear normal. Mood & affect appropriate.   Data Reviewed: I have personally  reviewed following labs and imaging studies  CBC: Recent Labs  Lab 08/07/19 0314 08/08/19 0313 08/10/19 0352 08/11/19 0332 08/12/19 0430  WBC 11.3* 11.6* 11.2* 10.8* 10.4  NEUTROABS  --  7.9* 7.8*  --   --   HGB 8.5* 9.1* 9.0* 9.3* 9.4*  HCT 27.9* 29.4* 29.6* 29.9* 30.8*  MCV 94.9 93.0 93.7 93.1 93.9  PLT 344 349 337 325 [id number]   Basic Metabolic Panel: Recent Labs  Lab 08/06/19 0433 08/07/19 0314 08/08/19 0313 08/10/19 0352 08/11/19 0332  NA 138 136 136 135 136  K 4.1 4.4 4.7 4.4 4.4  CL 100 100 99 97* 98  CO2 29 28 28 28 28   GLUCOSE 128* 166* 144* 180* 193*  BUN 13 17 16 18 18   CREATININE 0.70 0.81 0.78 0.71 0.86  CALCIUM 8.4* 8.5* 9.0 8.9 9.0  MG 1.7 1.7 1.9 1.9  --   PHOS  --   --  4.5 3.5  --    GFR: Estimated Creatinine Clearance: 122.1 mL/min (by C-G formula based on SCr of 0.86 mg/dL). Liver Function Tests: Recent Labs  Lab 08/08/19 0313 08/10/19 0352  ALBUMIN 2.0* 2.1*   No results for input(s): LIPASE, AMYLASE in the last 168 hours. No results for input(s): AMMONIA in the last 168 hours. Coagulation Profile: No results for input(s): INR, PROTIME in the last 168 hours. Cardiac Enzymes: No results for input(s): CKTOTAL, CKMB, CKMBINDEX, TROPONINI in the last 168 hours. BNP (last 3 results) No results for input(s): PROBNP in the last 8760 hours. HbA1C: No results for input(s): HGBA1C in the last 72 hours. CBG: Recent Labs  Lab 08/11/19 0738 08/11/19 1253 08/11/19 1812 08/11/19 2214 08/12/19 0813  GLUCAP 196* 203* 165* 170* 123*   Lipid Profile: No results for input(s): CHOL, HDL, LDLCALC, TRIG, CHOLHDL, LDLDIRECT in the last 72 hours. Thyroid Function Tests: No results for input(s): TSH, T4TOTAL, FREET4, T3FREE, THYROIDAB in the last 72 hours. Anemia Panel: No results for input(s): VITAMINB12, FOLATE, FERRITIN, TIBC, IRON, RETICCTPCT in the last 72 hours. Sepsis Labs: No results for input(s): PROCALCITON, LATICACIDVEN in the last 168  hours.  No results found for this or any previous visit (from the past 240 hour(s)).   Radiology Studies: No results found.  Scheduled Meds: . acetaminophen  650 mg Oral Q6H  . amiodarone  200 mg Oral Daily  . apixaban  5 mg Oral BID  . chlorhexidine  15 mL Mouth Rinse BID  . Chlorhexidine Gluconate Cloth  6 each Topical Daily  . docusate sodium  100 mg Oral BID  . feeding supplement (ENSURE ENLIVE)  237 mL Oral BID BM  . folic acid  1 mg Oral Daily  . gabapentin  300 mg Oral TID  . insulin aspart  0-6 Units Subcutaneous TID AC & HS  . lisinopril  10 mg Oral Daily  . magnesium oxide  400 mg Oral BID  . mouth rinse  15 mL Mouth Rinse q12n4p  .  metoprolol succinate  200 mg Oral Daily  . morphine  15 mg Oral Q12H  . multivitamin with minerals  1 tablet Oral Daily  . pantoprazole  40 mg Oral Daily  . psyllium  1 packet Oral Daily  . QUEtiapine  25 mg Oral BID  . sodium chloride flush  10-40 mL Intracatheter Q12H  . thiamine  100 mg Oral Daily   Continuous Infusions: . vancomycin 1,250 mg (08/12/19 0427)     LOS: 30 days   Time spent: [place], MD [facility] Pager [phone]  If 7PM-7AM, please contact night-coverage [email or web address] Password [facility] 08/12/2019, 11:08 AM

## 2019-08-13 DIAGNOSIS — I480 Paroxysmal atrial fibrillation: Secondary | ICD-10-CM

## 2019-08-13 LAB — CBC
HCT: 30.1 % — ABNORMAL LOW (ref 39.0–52.0)
Hemoglobin: 9.2 g/dL — ABNORMAL LOW (ref 13.0–17.0)
MCH: 28.1 pg (ref 26.0–34.0)
MCHC: 30.6 g/dL (ref 30.0–36.0)
MCV: 92 fL (ref 80.0–100.0)
Platelets: 319 10*3/uL (ref 150–400)
RBC: 3.27 MIL/uL — ABNORMAL LOW (ref 4.22–5.81)
RDW: 15.3 % (ref 11.5–15.5)
WBC: 11.1 10*3/uL — ABNORMAL HIGH (ref 4.0–10.5)
nRBC: 0 % (ref 0.0–0.2)

## 2019-08-13 LAB — GLUCOSE, CAPILLARY
Glucose-Capillary: 118 mg/dL — ABNORMAL HIGH (ref 70–99)
Glucose-Capillary: 175 mg/dL — ABNORMAL HIGH (ref 70–99)
Glucose-Capillary: 132 mg/dL — ABNORMAL HIGH (ref 70–99)
Glucose-Capillary: 178 mg/dL — ABNORMAL HIGH (ref 70–99)

## 2019-08-13 LAB — BASIC METABOLIC PANEL
Anion gap: 10 (ref 5–15)
BUN: 19 mg/dL (ref 6–20)
CO2: 27 mmol/L (ref 22–32)
Calcium: 9 mg/dL (ref 8.9–10.3)
Chloride: 97 mmol/L — ABNORMAL LOW (ref 98–111)
Creatinine, Ser: 0.68 mg/dL (ref 0.61–1.24)
GFR calc Af Amer: >60 mL/min (ref >60)
GFR calc non Af Amer: >60 mL/min (ref >60)
Glucose, Bld: 163 mg/dL — ABNORMAL HIGH (ref 70–99)
Potassium: 4.4 mmol/L (ref 3.5–5.1)
Sodium: 134 mmol/L — ABNORMAL LOW (ref 135–145)

## 2019-08-13 MED ORDER — OXYCODONE HCL 5 MG PO TABS: 5.0000 mg | ORAL_TABLET | Freq: Once | ORAL | Status: DC | PRN

## 2019-08-13 NOTE — Progress Notes (Signed)
PROGRESS NOTE    Vernon Frye  [id number] DOB: 12-12-1960 DOA: 07/12/2019 PCP: Nicholes Rough, PA-C   Brief Narrative:  59 yo M presenting with left knee swelling. Found to have septic arthritis of the knee. Seen by orthopedics for left total knee arthroplasty. Required intubation. Found to have new onset a fib RVR. Seen by cardiology. Had DCCV. Improving. Transferred to [facility] 08/03/19.  Hospital course has been complicated by difficulty controlling pain.  Patient does not wish to participate in physical therapy and less he has minimal pain and no as he describes a crunching in his knee.  Patient is uninsured and will likely stay through February 9 given need for ongoing IV antibiotics in the setting of MRSA bacteremia and infectious arthritis.   Assessment & Plan:   Principal Problem:   MRSA bacteremia Active Problems:   Septic joint ([place])   Atrial fibrillation with rapid ventricular response (HCC)   AKI (acute kidney injury) ([place])   Hyponatremia   Severe sepsis (HCC)   Severe sepsis with acute organ dysfunction (HCC)   Respiratory insufficiency   Acute pain of left knee   Prosthetic joint infection (HCC)   Tachypnea   Acute bilateral low back pain without sciatica   Left arm swelling   Red man syndrome   Acute metabolic encephalopathy   S/P revision of total knee   Malnutrition of moderate degree   Acute respiratory failure ([place])   MRSA bacteremia L knee septic joint - Bld Cx from 07/30/19 neg - on vanc thru2/9/21 - TEE without vegetations.  - S/p knee replacement on 12/29  - called ortho 08/11/19 as patient has concerns and wants to talk to [name]. Per notes, work on pain control    -  Discussed importance of working with PT with patient at length today. Agreed to trial oxycodone before PT session. Discussed that some noise/crepitus is normal with ROM. Patient amenable to try.  -Needs SNF, but has no insurance, may likely need to stay through  IV course 09/06/19  Atrial fibrillation, now rate controlled  - seen by cards - eliquis, metoprolol, amiodarone - continue current tx, he is stable  HTN - metoprolol XL 200 mg qday  Etoh abuse Acute encephalopathy (toxic vs metabolic)- resolved  -he is more awake today  - back on home xanax 1 mg qhs only - morphine MS 15 mg BID    - oxycodone q 6 hr prn breakthrough pain - tolerating seroquel       DVT prophylaxis: Eliquis Code Status: Full Family Communication: patient only Disposition Plan: 09/06/19 due to need for inpatient IV antibiotics  Consultants:   Orthopedics  Procedures:   L knee replacement   Antimicrobials:   Vancomycin stop date 09/06/19   Subjective: Patient reports pain is a 9/10. Goal is to have pain at 7-9/10 with movement. Reports whenever he moves, pain increases to 10/10.   Objective: Vitals:   08/12/19 1634 08/12/19 2122 08/13/19 0635 08/13/19 1309  BP: (!) 142/81 133/88 114/80 (!) 150/86  Pulse: 87 81 75 92  Resp: 20 18 18 16   Temp: 98 F (36.7 C) 98.4 F (36.9 C) 98.1 F (36.7 C) 98.4 F (36.9 C)  TempSrc: Oral Oral Oral Oral  SpO2: 98% 97% 97% 100%  Weight:      Height:        Intake/Output Summary (Last 24 hours) at 08/13/2019 1628 Last data filed at 08/13/2019 1313 Gross per 24 hour  Intake --  Output 4375 ml  Net -4375 ml   Filed Weights   08/05/19 0434 08/06/19 0500 08/11/19 0600  Weight: 107.1 kg 109 kg 103.8 kg    Examination:  General exam: Appears calm and comfortable, sleeping on entry Respiratory system: Clear to auscultation. Respiratory effort normal. Cardiovascular system: S1 & S2 heard, RRR. No JVD, murmurs, rubs, gallops or clicks. No pedal edema. Gastrointestinal system: Abdomen is nondistended, soft and nontender. No organomegaly or masses felt. Normal bowel sounds heard. Central nervous system: Alert and oriented. No focal neurological deficits. Extremities: left knee with  brace in place, no erythema, purulence, drainage, right leg normal Skin: No rashes, lesions or ulcers Psychiatry: Judgement and insight appear normal. Mood & affect appropriate.   Data Reviewed: I have personally reviewed following labs and imaging studies  CBC: Recent Labs  Lab 08/08/19 0313 08/10/19 0352 08/11/19 0332 08/12/19 0430 08/13/19 0413  WBC 11.6* 11.2* 10.8* 10.4 11.1*  NEUTROABS 7.9* 7.8*  --   --   --   HGB 9.1* 9.0* 9.3* 9.4* 9.2*  HCT 29.4* 29.6* 29.9* 30.8* 30.1*  MCV 93.0 93.7 93.1 93.9 92.0  PLT 349 337 325 342 [id number]   Basic Metabolic Panel: Recent Labs  Lab 08/07/19 0314 08/08/19 0313 08/10/19 0352 08/11/19 0332 08/13/19 0413  NA 136 136 135 136 134*  K 4.4 4.7 4.4 4.4 4.4  CL 100 99 97* 98 97*  CO2 28 28 28 28 27   GLUCOSE 166* 144* 180* 193* 163*  BUN 17 16 18 18 19   CREATININE 0.81 0.78 0.71 0.86 0.68  CALCIUM 8.5* 9.0 8.9 9.0 9.0  MG 1.7 1.9 1.9  --   --   PHOS  --  4.5 3.5  --   --   No results for input(s): CHOL, HDL, LDLCALC, TRIG, CHOLHDL, LDLDIRECT in the last 72 hours.  Radiology Studies: No results found.  Scheduled Meds: . acetaminophen  650 mg Oral Q6H  . amiodarone  200 mg Oral Daily  . apixaban  5 mg Oral BID  . chlorhexidine  15 mL Mouth Rinse BID  . Chlorhexidine Gluconate Cloth  6 each Topical Daily  . docusate sodium  100 mg Oral BID  . feeding supplement (ENSURE ENLIVE)  237 mL Oral BID BM  . folic acid  1 mg Oral Daily  . gabapentin  300 mg Oral TID  . insulin aspart  0-6 Units Subcutaneous TID AC & HS  . lisinopril  10 mg Oral Daily  . magnesium oxide  400 mg Oral BID  . mouth rinse  15 mL Mouth Rinse q12n4p  . metoprolol succinate  200 mg Oral Daily  . morphine  15 mg Oral Q12H  . multivitamin with minerals  1 tablet Oral Daily  . pantoprazole  40 mg Oral Daily  . psyllium  1 packet Oral Daily  . QUEtiapine  25 mg Oral BID  . sodium chloride flush  10-40 mL Intracatheter Q12H  . thiamine  100 mg Oral Daily    Continuous Infusions: . vancomycin 1,250 mg (08/13/19 1553)     LOS: 31 days   Time spent: [place], MD [facility] Pager [phone]  If 7PM-7AM, please contact night-coverage [email or web address] Password [facility] 08/13/2019, 4:28 PM

## 2019-08-14 LAB — BASIC METABOLIC PANEL
Anion gap: 9 (ref 5–15)
BUN: 18 mg/dL (ref 6–20)
CO2: 25 mmol/L (ref 22–32)
Calcium: 9.2 mg/dL (ref 8.9–10.3)
Chloride: 101 mmol/L (ref 98–111)
Creatinine, Ser: 0.7 mg/dL (ref 0.61–1.24)
GFR calc Af Amer: >60 mL/min (ref >60)
GFR calc non Af Amer: >60 mL/min (ref >60)
Glucose, Bld: 172 mg/dL — ABNORMAL HIGH (ref 70–99)
Potassium: 4.5 mmol/L (ref 3.5–5.1)
Sodium: 135 mmol/L (ref 135–145)

## 2019-08-14 LAB — CBC
HCT: 31.4 % — ABNORMAL LOW (ref 39.0–52.0)
Hemoglobin: 9.6 g/dL — ABNORMAL LOW (ref 13.0–17.0)
MCH: 28.2 pg (ref 26.0–34.0)
MCHC: 30.6 g/dL (ref 30.0–36.0)
MCV: 92.1 fL (ref 80.0–100.0)
Platelets: 319 10*3/uL (ref 150–400)
RBC: 3.41 MIL/uL — ABNORMAL LOW (ref 4.22–5.81)
RDW: 15.6 % — ABNORMAL HIGH (ref 11.5–15.5)
WBC: 10.2 10*3/uL (ref 4.0–10.5)
nRBC: 0 % (ref 0.0–0.2)

## 2019-08-14 LAB — GLUCOSE, CAPILLARY
Glucose-Capillary: 164 mg/dL — ABNORMAL HIGH (ref 70–99)
Glucose-Capillary: 131 mg/dL — ABNORMAL HIGH (ref 70–99)
Glucose-Capillary: 142 mg/dL — ABNORMAL HIGH (ref 70–99)
Glucose-Capillary: 209 mg/dL — ABNORMAL HIGH (ref 70–99)

## 2019-08-14 LAB — VANCOMYCIN, PEAK: Vancomycin Pk: 32 ug/mL (ref 30–40)

## 2019-08-14 MED ORDER — MORPHINE SULFATE (PF) 2 MG/ML IV SOLN
2.0000 mg | Freq: Once | INTRAVENOUS | Status: AC
  Administered 2019-08-14: 2 mg via INTRAVENOUS
  Filled 2019-08-14: qty 1

## 2019-08-14 MED ORDER — ALTEPLASE 2 MG IJ SOLR
2.0000 mg | Freq: Once | INTRAMUSCULAR | Status: AC
  Administered 2019-08-14: 2 mg

## 2019-08-14 NOTE — Plan of Care (Signed)

## 2019-08-14 NOTE — Progress Notes (Signed)
Pharmacy Antibiotic Note  Vernon Frye is a 59 y.o. male admitted on 07/12/2019 with MRSA bacteremia and prosthetic joint infection. Pharmacy has been consulted for vancomycin dosing. Pt on extended infusion time (4hrs) to alleviate Red Man Syndrome; no adverse effects reported since this was begun.  TEE negative for vegetations on 1/5, Cr stable, OPAT orders placed though likely to stay inpatient for administration thru intended stop date of 09/06/19.  A Vancomycin peak today was 32 mcg/ml however a Vancomycin trough could not be obtained since the RN already started infusing the dose. Will retime levels for 1/18 and not make any adjustments for now.   Plan: - Continue Vancomycin 1250mg  IV q12h  - Will re-time and order levels for 1/18 - Monitor clinical progress, c/s, renal function and vanc levels as needed - ID planning vancomycin continuing through 09/06/19   Height: 6\' 3"  (190.5 cm) Weight: 228 lb 13.4 oz (103.8 kg) IBW/kg (Calculated) : 84.5  Temp (24hrs), Avg:98.4 F (36.9 C), Min:98.4 F (36.9 C), Max:98.5 F (36.9 C)  Recent Labs  Lab 08/07/19 0915 08/08/19 0313 08/10/19 0352 08/11/19 0332 08/12/19 0430 08/13/19 0413  WBC  --  11.6* 11.2* 10.8* 10.4 11.1*  CREATININE  --  0.78 0.71 0.86  --  0.68  VANCOPEAK 28*  --   --   --   --   --     Estimated Creatinine Clearance: 131.3 mL/min (by C-G formula based on SCr of 0.68 mg/dL).    Allergies  Allergen Reactions  . Vancomycin Shortness Of Breath and Rash    [name]    Antimicrobials: Vanc 12/22>>(09/06/19) **Vanc 12/16 x1 - only received 1/2 dose d/t red man's syndrome, MD said d/c, then resume vanc Ceftriaxone 12/16 x1 Zosyn 12/16x1 Dapto 12/16>>12/22 - CK qWed - 126 (BL, 12/17)  12/25: VT 11, AUC 356, switched to vanc IV 1500 Q12 (over 4h d/t Red man, AUC 530) 12/28 VP = 28; trough 17 (giving over 4 hrs- but can use for kinetics)- AUC on 1500 q 12 = 591 - reduce to 1250 q 12 for AUC 492 12/31 VP 20,  VT 13 - AUC 456 (giving over 4hrs) - cont 1250 q12h 1/3 VR 19, VT 15 - AUC 455 (giving over 4 hrs) - cont 1250 q12h 1/10 VT 14, VP 28 - cont 1250mg  q12h  Microbiology: 12/16 BCx 2/2 mrsa 12/16L knee fluid: abundant MRSA 12/16 Ucx: neg 12/16 Grp A strep - neg 12/17 BCx >>1/2 SA 12/18 BCx >> ngF 1/2 Cdiff >> neg 1/2 UCx >>  >100k GNR 1/2 BCx >> ngtd  Thank you for allowing pharmacy to be a part of this patient's care.  Alycia Rossetti, PharmD, BCPS Clinical Pharmacist Clinical phone for 08/14/2019: [phone] 08/14/2019 8:02 AM   **Pharmacist phone directory can now be found on [email or web address] ([email or web address]).  Listed under [place].

## 2019-08-14 NOTE — Progress Notes (Signed)
Dressing changed as ordered. Pt tolerated well. Will continue to monitor.

## 2019-08-14 NOTE — Progress Notes (Signed)
PROGRESS NOTE    Vernon Frye  [id number] DOB: 1961-05-31 DOA: 07/12/2019 PCP: Nicholes Rough, PA-C   Brief Narrative:  59 yo M presenting with left knee swelling. Found to have septic arthritis of the knee. Seen by orthopedics for left total knee arthroplasty. Required intubation. Found to have new onset a fib RVR. Seen by cardiology. Had DCCV. Improving. Transferred to [facility] 08/03/19.  Hospital course has been complicated by difficulty controlling pain.  Patient does not wish to participate in physical therapy and less he has minimal pain and no as he describes a crunching in his knee.  Patient is uninsured and will likely stay through February 9 given need for ongoing IV antibiotics in the setting of MRSA bacteremia and infectious arthritis.   Assessment & Plan:   Principal Problem:   MRSA bacteremia Active Problems:   Septic joint ([place])   Atrial fibrillation with rapid ventricular response (HCC)   AKI (acute kidney injury) ([place])   Hyponatremia   Severe sepsis (HCC)   Severe sepsis with acute organ dysfunction (HCC)   Respiratory insufficiency   Acute pain of left knee   Prosthetic joint infection (HCC)   Tachypnea   Acute bilateral low back pain without sciatica   Left arm swelling   Red man syndrome   Acute metabolic encephalopathy   S/P revision of total knee   Malnutrition of moderate degree   Acute respiratory failure ([place])   MRSA bacteremia L knee septic joint - Bld Cx from 07/30/19 neg - on vanc thru2/9/21 - TEE without vegetations.  - S/p knee replacement on 12/29      -After discussion with hospitalist Dr. Owens Shark, patient agreeable to PT with trial of Oxycodone prior to sessions for pain control  -Needs SNF, but has no insurance, may likely need to stay through IV course 09/06/19  Atrial fibrillation, now rate controlled  - seen by cards - eliquis, metoprolol, amiodarone - continue current tx, he is stable  HTN -  metoprolol XL 200 mg qday  Etoh abuse Acute encephalopathy (toxic vs metabolic)- resolved  -he is more awake today  - back on home xanax 1 mg qhs only - morphine MS 15 mg BID    - oxycodone q 6 hr prn breakthrough pain - tolerating seroquel       DVT prophylaxis: Eliquis Code Status: Full Family Communication: patient only Disposition Plan: 09/06/19 due to need for inpatient IV antibiotics  Consultants:   Orthopedics  Procedures:   L knee replacement   Antimicrobials:   Vancomycin stop date 09/06/19   Subjective: Patient frustrated this morning by pain and need for extended stay. Reports any deviation of his left knee side to side causes intense pain. Can lift leg straight up into air with minimal pain.   Objective: Vitals:   08/13/19 1309 08/13/19 2117 08/14/19 0500 08/14/19 0548  BP: (!) 150/86 118/80  126/80  Pulse: 92 84  76  Resp: 16 18  20   Temp: 98.4 F (36.9 C) 98.5 F (36.9 C)  98.4 F (36.9 C)  TempSrc: Oral Axillary  Axillary  SpO2: 100% 100%  96%  Weight:   103.8 kg   Height:        Intake/Output Summary (Last 24 hours) at 08/14/2019 0958 Last data filed at 08/14/2019 0820 Gross per 24 hour  Intake 360 ml  Output 3775 ml  Net -3415 ml   Filed Weights   08/06/19 0500 08/11/19 0600 08/14/19 0500  Weight: 109 kg 103.8 kg 103.8  kg    Examination:  General exam: Appears calm and comfortable, sleeping on entry Respiratory system: Clear to auscultation. Respiratory effort normal. Cardiovascular system: S1 & S2 heard, RRR. No JVD, murmurs, rubs, gallops or clicks. Left pedal edema present.  Gastrointestinal system: Abdomen is nondistended, soft and nontender. No organomegaly or masses felt. Normal bowel sounds heard. Central nervous system: Alert and oriented. No focal neurological deficits. Extremities: left knee with brace in place, no erythema purulence, drainage, right leg normal Skin: No rashes, lesions or ulcers Psychiatry:  Judgement and insight appear normal. Mood & affect appropriate.   Data Reviewed: I have personally reviewed following labs and imaging studies  CBC: Recent Labs  Lab 08/08/19 0313 08/08/19 0313 08/10/19 0352 08/11/19 0332 08/12/19 0430 08/13/19 0413 08/14/19 0840  WBC 11.6*   < > 11.2* 10.8* 10.4 11.1* 10.2  NEUTROABS 7.9*  --  7.8*  --   --   --   --   HGB 9.1*   < > 9.0* 9.3* 9.4* 9.2* 9.6*  HCT 29.4*   < > 29.6* 29.9* 30.8* 30.1* 31.4*  MCV 93.0   < > 93.7 93.1 93.9 92.0 92.1  PLT 349   < > 337 325 342 319 319   < > = values in this interval not displayed.   Basic Metabolic Panel: Recent Labs  Lab 08/08/19 0313 08/10/19 0352 08/11/19 0332 08/13/19 0413 08/14/19 0840  NA 136 135 136 134* 135  K 4.7 4.4 4.4 4.4 4.5  CL 99 97* 98 97* 101  CO2 28 28 28 27 25   GLUCOSE 144* 180* 193* 163* 172*  BUN 16 18 18 19 18   CREATININE 0.78 0.71 0.86 0.68 0.70  CALCIUM 9.0 8.9 9.0 9.0 9.2  MG 1.9 1.9  --   --   --   PHOS 4.5 3.5  --   --   --   No results for input(s): CHOL, HDL, LDLCALC, TRIG, CHOLHDL, LDLDIRECT in the last 72 hours.  Radiology Studies: No results found.  Scheduled Meds: . acetaminophen  650 mg Oral Q6H  . amiodarone  200 mg Oral Daily  . apixaban  5 mg Oral BID  . chlorhexidine  15 mL Mouth Rinse BID  . Chlorhexidine Gluconate Cloth  6 each Topical Daily  . docusate sodium  100 mg Oral BID  . feeding supplement (ENSURE ENLIVE)  237 mL Oral BID BM  . folic acid  1 mg Oral Daily  . gabapentin  300 mg Oral TID  . insulin aspart  0-6 Units Subcutaneous TID AC & HS  . lisinopril  10 mg Oral Daily  . magnesium oxide  400 mg Oral BID  . mouth rinse  15 mL Mouth Rinse q12n4p  . metoprolol succinate  200 mg Oral Daily  . morphine  15 mg Oral Q12H  . multivitamin with minerals  1 tablet Oral Daily  . pantoprazole  40 mg Oral Daily  . psyllium  1 packet Oral Daily  . QUEtiapine  25 mg Oral BID  . sodium chloride flush  10-40 mL Intracatheter Q12H  .  thiamine  100 mg Oral Daily   Continuous Infusions: . vancomycin 1,250 mg (08/14/19 0312)     LOS: 32 days   Time spent: [place], DO [facility]  If 7PM-7AM, please contact night-coverage [email or web address] Password [facility] 08/14/2019, 9:58 AM

## 2019-08-15 LAB — VANCOMYCIN, TROUGH: Vancomycin Tr: 18 ug/mL (ref 15–20)

## 2019-08-15 LAB — VANCOMYCIN, PEAK: Vancomycin Pk: 32 ug/mL (ref 30–40)

## 2019-08-15 LAB — GLUCOSE, CAPILLARY
Glucose-Capillary: 131 mg/dL — ABNORMAL HIGH (ref 70–99)
Glucose-Capillary: 206 mg/dL — ABNORMAL HIGH (ref 70–99)
Glucose-Capillary: 120 mg/dL — ABNORMAL HIGH (ref 70–99)
Glucose-Capillary: 173 mg/dL — ABNORMAL HIGH (ref 70–99)

## 2019-08-15 MED ORDER — MORPHINE SULFATE (PF) 2 MG/ML IV SOLN
2.0000 mg | Freq: Every day | INTRAVENOUS | Status: DC | PRN
  Administered 2019-08-15 – 2019-08-18 (×4): 2 mg via INTRAVENOUS
  Filled 2019-08-15 (×4): qty 1

## 2019-08-15 MED ORDER — OXYCODONE HCL 5 MG PO TABS
10.0000 mg | ORAL_TABLET | ORAL | Status: DC | PRN
  Administered 2019-08-15 – 2019-08-16 (×4): 10 mg via ORAL
  Filled 2019-08-15 (×7): qty 2

## 2019-08-15 MED ORDER — VANCOMYCIN HCL IN DEXTROSE 1-5 GM/200ML-% IV SOLN
1000.0000 mg | Freq: Two times a day (BID) | INTRAVENOUS | Status: DC
  Administered 2019-08-16 – 2019-08-18 (×5): 1000 mg via INTRAVENOUS
  Filled 2019-08-15 (×6): qty 200

## 2019-08-15 NOTE — Progress Notes (Addendum)
PROGRESS NOTE    Vernon Frye  [id number] DOB: 09-23-1960 DOA: 07/12/2019 PCP: Nicholes Rough, PA-C   Brief Narrative:  59 yo M presenting with left knee swelling. Found to have septic arthritis of the knee. Seen by orthopedics for left total knee arthroplasty. Required intubation. Found to have new onset a fib RVR. Seen by cardiology. Had DCCV. Improving. Transferred to [facility] 08/03/19.  08/15/19:No acute events ON. Continue to monitor some of his meds leading to drowsiness. IV abx through 09/06/19; need SNF, but no insurance and doesn't seem motivated;per case manager he will need to stay inpatient through 09/06/19 since he does not have support to go home and do PICC  Assessment & Plan:   Principal Problem:   MRSA bacteremia Active Problems:   Septic joint ([place])   Atrial fibrillation with rapid ventricular response ([place])   AKI (acute kidney injury) ([place])   Hyponatremia   Severe sepsis ([place])   Severe sepsis with acute organ dysfunction ([place])   Respiratory insufficiency   Acute pain of left knee   Prosthetic joint infection ([place])   Tachypnea   Acute bilateral low back pain without sciatica   Left arm swelling   Red man syndrome   Acute metabolic encephalopathy   S/P revision of total knee   Malnutrition of moderate degree   Acute respiratory failure (HCC)  Acute Afib: - seen by cards - eliquis, metoprolol, amiodarone - continue current tx, he is stable  MRSA bacteremia L knee septic joint - Bld Cx from 07/30/19 neg - on vanc thru2/9/21 - TEE without vegetations.  - S/p knee replacement -called ortho 08/11/19 and 08/12/19 as patient has concerns and wants to talk to [name] but [name] feels he needs to work with pain management and does not appear to have come to see the patient and address his concerns -Needs SNF, but has no insurance,likely need to stay through IV course 09/06/19 -called palliative care to see if they could do  pain management consult but they are unable given circumstances  HTN - metoprolol XL 200 mg qday  Etoh abuse Acute encephalopathy (toxic vs metabolic)  -back on home xanax 1 mg qhs only -morphine MS 15 mg BID - oxycodone q 6 hr prn breakthrough pain - tolerating seroquel  DVT prophylaxis: eliquis Code Status: Full Family Communication: patient only Disposition Plan: home after finishing IV antibiotics 09/06/19  Consultants:   Orthopedics  Procedures:   L knee replacement   Antimicrobials:   Vancomycin stop date 09/06/19   Subjective: Tired, just got to sleep and wants to sleep. States doctor told him not to move left leg. Has not been working with PT/OT. Denies new concerns other than left leg pain.   Objective: Vitals:   08/14/19 1539 08/14/19 2106 08/15/19 0550 08/15/19 1003  BP: 113/76 135/86 119/76 (!) 140/91  Pulse: 79 85 84 76  Resp: 16 18 18    Temp: 99.3 F (37.4 C) 99.3 F (37.4 C) 98.7 F (37.1 C)   TempSrc: Oral  Oral   SpO2: 97% 100% 98%   Weight:      Height:        Intake/Output Summary (Last 24 hours) at 08/15/2019 1134 Last data filed at 08/15/2019 1103 Gross per 24 hour  Intake 1073 ml  Output 4450 ml  Net -3377 ml   Filed Weights   08/06/19 0500 08/11/19 0600 08/14/19 0500  Weight: 109 kg 103.8 kg 103.8 kg    Examination:  General exam: Appears calm  and comfortable, sleepy  Respiratory system: Clear to auscultation. Respiratory effort normal. Cardiovascular system: S1 & S2 heard, RRR. No JVD, murmurs, rubs, gallops or clicks. No pedal edema. Gastrointestinal system: Abdomen is nondistended, soft and nontender. No organomegaly or masses felt. Normal bowel sounds heard. Central nervous system: Alert and oriented. No focal neurological deficits. Extremities: moving right leg, will not move left leg Skin: No rashes, lesions or ulcers Psychiatry: Judgement and insight appear normal. Mood & affect appropriate.   Data  Reviewed: I have personally reviewed following labs and imaging studies  CBC: Recent Labs  Lab 08/10/19 0352 08/11/19 0332 08/12/19 0430 08/13/19 0413 08/14/19 0840  WBC 11.2* 10.8* 10.4 11.1* 10.2  NEUTROABS 7.8*  --   --   --   --   HGB 9.0* 9.3* 9.4* 9.2* 9.6*  HCT 29.6* 29.9* 30.8* 30.1* 31.4*  MCV 93.7 93.1 93.9 92.0 92.1  PLT 337 325 342 319 [id number]   Basic Metabolic Panel: Recent Labs  Lab 08/10/19 0352 08/11/19 0332 08/13/19 0413 08/14/19 0840  NA 135 136 134* 135  K 4.4 4.4 4.4 4.5  CL 97* 98 97* 101  CO2 28 28 27 25   GLUCOSE 180* 193* 163* 172*  BUN 18 18 19 18   CREATININE 0.71 0.86 0.68 0.70  CALCIUM 8.9 9.0 9.0 9.2  MG 1.9  --   --   --   PHOS 3.5  --   --   --    GFR: Estimated Creatinine Clearance: 131.3 mL/min (by C-G formula based on SCr of 0.7 mg/dL). Liver Function Tests: Recent Labs  Lab 08/10/19 0352  ALBUMIN 2.1*   No results for input(s): LIPASE, AMYLASE in the last 168 hours. No results for input(s): AMMONIA in the last 168 hours. Coagulation Profile: No results for input(s): INR, PROTIME in the last 168 hours. Cardiac Enzymes: No results for input(s): CKTOTAL, CKMB, CKMBINDEX, TROPONINI in the last 168 hours. BNP (last 3 results) No results for input(s): PROBNP in the last 8760 hours. HbA1C: No results for input(s): HGBA1C in the last 72 hours. CBG: Recent Labs  Lab 08/14/19 0738 08/14/19 1135 08/14/19 1653 08/14/19 2048 08/15/19 0740  GLUCAP 164* 131* 142* 209* 131*   Lipid Profile: No results for input(s): CHOL, HDL, LDLCALC, TRIG, CHOLHDL, LDLDIRECT in the last 72 hours. Thyroid Function Tests: No results for input(s): TSH, T4TOTAL, FREET4, T3FREE, THYROIDAB in the last 72 hours. Anemia Panel: No results for input(s): VITAMINB12, FOLATE, FERRITIN, TIBC, IRON, RETICCTPCT in the last 72 hours. Sepsis Labs: No results for input(s): PROCALCITON, LATICACIDVEN in the last 168 hours.  No results found for this or any previous  visit (from the past 240 hour(s)).    Radiology Studies: No results found.  Scheduled Meds: . acetaminophen  650 mg Oral Q6H  . amiodarone  200 mg Oral Daily  . apixaban  5 mg Oral BID  . chlorhexidine  15 mL Mouth Rinse BID  . Chlorhexidine Gluconate Cloth  6 each Topical Daily  . docusate sodium  100 mg Oral BID  . feeding supplement (ENSURE ENLIVE)  237 mL Oral BID BM  . folic acid  1 mg Oral Daily  . gabapentin  300 mg Oral TID  . insulin aspart  0-6 Units Subcutaneous TID AC & HS  . lisinopril  10 mg Oral Daily  . magnesium oxide  400 mg Oral BID  . mouth rinse  15 mL Mouth Rinse q12n4p  . metoprolol succinate  200 mg Oral Daily  .  morphine  15 mg Oral Q12H  . multivitamin with minerals  1 tablet Oral Daily  . pantoprazole  40 mg Oral Daily  . psyllium  1 packet Oral Daily  . QUEtiapine  25 mg Oral BID  . sodium chloride flush  10-40 mL Intracatheter Q12H  . thiamine  100 mg Oral Daily   Continuous Infusions: . vancomycin 1,250 mg (08/15/19 0347)     LOS: 33 days   Time spent: [place], MD [facility] Pager [phone]  If 7PM-7AM, please contact night-coverage [email or web address] Password [facility] 08/15/2019, 11:34 AM

## 2019-08-15 NOTE — Progress Notes (Signed)
     Subjective: 10 Days Post-Op Procedure(s) (LRB): TRANSESOPHAGEAL ECHOCARDIOGRAM (TEE) (N/A) CARDIOVERSION (N/A)   Patient reports pain as moderate/severe.  States that he was taking more medication while at home.  Discussed trying to reduce the amount and allow him to participate in his recovery.   Objective:   VITALS:   Vitals:   08/14/19 2106 08/15/19 0550  BP: 135/86 119/76  Pulse: 85 84  Resp: 18 18  Temp: 99.3 F (37.4 C) 98.7 F (37.1 C)  SpO2: 100% 98%    Dorsiflexion/Plantar flexion intact Incision: moderate drainage, through a ~2cm area at the distal portion of the incision No cellulitis present Compartment soft  LABS Recent Labs    08/13/19 0413 08/14/19 0840  HGB 9.2* 9.6*  HCT 30.1* 31.4*  WBC 11.1* 10.2  PLT 319 319    Recent Labs    08/13/19 0413 08/14/19 0840  NA 134* 135  K 4.4 4.5  BUN 19 18  CREATININE 0.68 0.70  GLUCOSE 163* 172*     Assessment/Plan: 10 Days Post-Op Procedure(s) (LRB): TRANSESOPHAGEAL ECHOCARDIOGRAM (TEE) (N/A) CARDIOVERSION (N/A)  Dressing changed, change daily and as needed. Up with therapy , NO need to work on ROM (cement block spacer), only ambulation safety/assitance Discharge disposition TBD, plan appear to be to keep him in the hospital until he finishes his antibiotic course     Vernon Frye. Danni Shima   PAC  08/15/2019, 7:36 AM

## 2019-08-15 NOTE — Progress Notes (Signed)
Pharmacy Antibiotic Note  Vernon Frye is a 59 y.o. male admitted on 07/12/2019 with MRSA bacteremia and septic knee joint .  Pharmacy has been consulted for Vanco dosing.  ID: Vanc for MRSA bacteremia and septic knee joint (PJI) s/p I&D, polyexchange, abx spacer. MRI neg for spinal abscess/osteo. TTE neg. TEE negative for vegetations 1/5.  No rifampin with liver fx - 1/14 TKR removed, antibiotic spacer with Vanc and Tobra inserted into knee cavity - Afeb, WBC 10.2  1/18: VP 32, VT 18= AUC 592.7: Chg to 1g/12h (over 4hrs) for AUC 470.2  Vanc 12/22>>(09/06/19) **Vanc 12/16 x1 - only received 1/2 dose d/t red man's syndrome, MD said d/c, then resume vanc Ceftriaxone 12/16 x1 Zosyn 12/16x1  Dapto 12/16>>12/22 - CK qWed - 126 (BL, 12/17)  12/16 BCx 2/2 MRSA 12/16L knee fluid: abundant MRSA 12/16 Ucx: neg 12/16 Grp A strep - neg 12/17 BCx >>1/2 SA 12/18 BCx >> ngF 1/2 Cdiff >> neg 1/2 UCx >>  >100k Klebsiella (no tx, asymptomatic) 1/2 BCx >> negative   Plan: Decrease Vancomycin to 1000 IV q12 hrs - infusing over 4 hrs to avoid [name] (3a/3p) Plans 6 weeks abx (to stop 09/06/19; from removal of prosthetic material on 12/29)    Height: 6\' 3"  (190.5 cm) Weight: 228 lb 13.4 oz (103.8 kg) IBW/kg (Calculated) : 84.5  Temp (24hrs), Avg:98.9 F (37.2 C), Min:98.4 F (36.9 C), Max:99.3 F (37.4 C)  Recent Labs  Lab 08/10/19 0352 08/11/19 0332 08/12/19 0430 08/13/19 0413 08/14/19 0830 08/14/19 0840 08/15/19 0839 08/15/19 1427  WBC 11.2* 10.8* 10.4 11.1*  --  10.2  --   --   CREATININE 0.71 0.86  --  0.68  --  0.70  --   --   VANCOTROUGH  --   --   --   --   --   --   --  18  VANCOPEAK  --   --   --   --  32  --  32  --     Estimated Creatinine Clearance: 131.3 mL/min (by C-G formula based on SCr of 0.7 mg/dL).    Allergies  Allergen Reactions  . Vancomycin Shortness Of Breath and Rash    [name]     Meghanne Pletz S. Alford Highland, PharmD, BCPS Clinical Staff  Pharmacist [email or web address]  Wayland Salinas 08/15/2019 3:23 PM

## 2019-08-15 NOTE — Progress Notes (Signed)
Physical Therapy Treatment Patient Details Name: Vernon Frye MRN: [id number] DOB: 1961-05-13 Today's Date: 08/15/2019    History of Present Illness 59 yo M PMH Afib, HTN, L TKR (2016) who presented to ED with Lt knee pain. 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 Desaturated with hypoxemic respiratory failure requiring 3LNC. Pt underwent L TKA removal with placement of antibiotic spacers on 12/29, agitated after procedure and unable to be extubated. Extubated 07/31/19.    PT Comments    Pt sitting EOB with NT present upon arrival of PT, agreeable to PT session focused on mobility progression as he was already up. The pt continues to present with limitations in functional mobility, stability, and activity tolerance compared to their prior level of function and independence due to above dx and subsequent debility, weakness, and limited use of LLE. The pt was able to demo sit-stand transfers with modA of 2 and use of RW for safety, but c/o nausea sitting EOB and dizziness with standing. The pt expressed surprise at debility following multiple days of laying in bed, educated on importance of mobility and acute PT to maintain strength, pt verbalized agreement. PT will continue to follow acutely to maximize mobility, strength, and independence while maintaining TDWB LLE.     Follow Up Recommendations  SNF;Supervision/Assistance - 24 hour     Equipment Recommendations  Wheelchair (measurements PT);Wheelchair cushion (measurements PT);Hospital bed;3in1 (PT)    Recommendations for Other Services       Precautions / Restrictions Precautions Precautions: Fall Required Braces or Orthoses: Knee Immobilizer - Left Knee Immobilizer - Left: On at all times Restrictions Weight Bearing Restrictions: Yes LLE Weight Bearing: Touchdown weight bearing Other Position/Activity Restrictions: No L knee ROM    Mobility  Bed  Mobility Overal bed mobility: Needs Assistance Bed Mobility: Sit to Supine       Sit to supine: +2 for physical assistance;Max assist   General bed mobility comments: pt sitting EOB upon arrival of PT, requires mod/max of 2 to return to bed, one maxA to move LLE, one to control trunk and positioning  Transfers Overall transfer level: Needs assistance Equipment used: Rolling walker (2 wheeled) Transfers: Sit to/from Stand          Lateral/Scoot Transfers: Mod assist;+2 physical assistance General transfer comment: modA of 2 with RW, pt able to maintain TDWB LLE with verbal cues,  pt unable to take steps. c/o dizziness with standing, returned to sitting EOB  Ambulation/Gait Ambulation/Gait assistance: (unable)               Stairs             Wheelchair Mobility    Modified Rankin (Stroke Patients Only)       Balance Overall balance assessment: Needs assistance Sitting-balance support: Bilateral upper extremity supported;Feet supported Sitting balance-Leahy Scale: Fair Sitting balance - Comments: close min guard   Standing balance support: Bilateral upper extremity supported;During functional activity Standing balance-Leahy Scale: Poor Standing balance comment: requires B UE and support of 1 to maintain static stand                            Cognition Arousal/Alertness: Awake/alert Behavior During Therapy: WFL for tasks assessed/performed Overall Cognitive Status: Impaired/Different from baseline Area of Impairment: Memory;Following commands;Attention;Safety/judgement;Awareness                 Orientation Level: Place  Current Attention Level: Sustained Memory: Decreased recall of precautions Following Commands: Follows multi-step commands inconsistently Safety/Judgement: Decreased awareness of safety;Decreased awareness of deficits     General Comments: pt with poor insight into safety needing cues for WB restrictions and overall  body mechanics      Exercises General Exercises - Lower Extremity Ankle Circles/Pumps: AAROM;Left;Supine;15 reps Long Arc Quad: AROM;Right;Seated;10 reps Heel Slides: AROM;Right;Supine;10 reps Heel Raises: AROM;Right;15 reps;Seated    General Comments General comments (skin integrity, edema, etc.): pt reports nausea with sitting EOB and dizziness with standing, relieved with lying down      Pertinent Vitals/Pain Pain Assessment: Faces Faces Pain Scale: Hurts whole lot Pain Location: LLE and back with activity Pain Descriptors / Indicators: Aching;Guarding;Discomfort;Grimacing;Moaning Pain Intervention(s): Limited activity within patient's tolerance;Monitored during session;Repositioned;RN gave pain meds during session    Home Living                      Prior Function            PT Goals (current goals can now be found in the care plan section) Acute Rehab PT Goals Patient Stated Goal: get stronger PT Goal Formulation: With patient Time For Goal Achievement: 08/12/19 Potential to Achieve Goals: Fair Additional Goals Additional Goal #1: Pt will mobilize in manual wheelchair for 50' with use of BUE and minA. Progress towards PT goals: Progressing toward goals    Frequency    Min 3X/week      PT Plan Current plan remains appropriate    Co-evaluation              AM-PAC PT "6 Clicks" Mobility   Outcome Measure  Help needed turning from your back to your side while in a flat bed without using bedrails?: Total Help needed moving from lying on your back to sitting on the side of a flat bed without using bedrails?: A Lot Help needed moving to and from a bed to a chair (including a wheelchair)?: A Lot Help needed standing up from a chair using your arms (e.g., wheelchair or bedside chair)?: A Lot Help needed to walk in hospital room?: Total Help needed climbing 3-5 steps with a railing? : Total 6 Click Score: 9    End of Session Equipment Utilized  During Treatment: Left knee immobilizer;Gait belt Activity Tolerance: Patient tolerated treatment well Patient left: in bed;with call bell/phone within reach;with bed alarm set Nurse Communication: Mobility status;Precautions;Weight bearing status PT Visit Diagnosis: Other abnormalities of gait and mobility (R26.89);Pain Pain - Right/Left: Left Pain - part of body: Knee     Time: [id number] PT Time Calculation (min) (ACUTE ONLY): 43 min  Charges:  $Gait Training: 23-37 mins $Therapeutic Activity: 8-22 mins                     Karma Ganja, PT, DPT   Acute Rehabilitation Department Pager #: [phone]   Otho Bellows 08/15/2019, 4:23 PM

## 2019-08-16 LAB — GLUCOSE, CAPILLARY
Glucose-Capillary: 151 mg/dL — ABNORMAL HIGH (ref 70–99)
Glucose-Capillary: 155 mg/dL — ABNORMAL HIGH (ref 70–99)
Glucose-Capillary: 151 mg/dL — ABNORMAL HIGH (ref 70–99)
Glucose-Capillary: 168 mg/dL — ABNORMAL HIGH (ref 70–99)

## 2019-08-16 MED ORDER — OXYCODONE HCL 5 MG PO TABS
10.0000 mg | ORAL_TABLET | ORAL | Status: DC | PRN
  Administered 2019-08-16 – 2019-08-27 (×48): 15 mg via ORAL
  Administered 2019-08-27: 10 mg via ORAL
  Administered 2019-08-27 – 2019-09-03 (×34): 15 mg via ORAL
  Filled 2019-08-16 (×59): qty 3
  Filled 2019-08-16: qty 2
  Filled 2019-08-16 (×27): qty 3

## 2019-08-16 MED ORDER — KETOROLAC TROMETHAMINE 30 MG/ML IJ SOLN
30.0000 mg | Freq: Three times a day (TID) | INTRAMUSCULAR | Status: AC | PRN
  Administered 2019-08-16 – 2019-08-21 (×12): 30 mg via INTRAVENOUS
  Filled 2019-08-16 (×12): qty 1

## 2019-08-16 NOTE — Progress Notes (Signed)
PROGRESS NOTE    Vernon Frye  [id number] DOB: 1960/12/24 DOA: 07/12/2019 PCP: Nicholes Rough, PA-C   Brief Narrative:  59 yo M presenting with left knee swelling. Found to have septic arthritis of the knee. Seen by orthopedics for left total knee arthroplasty. Required intubation. Found to have new onset a fib RVR. Seen by cardiology. Had DCCV. Improving. Transferred to [facility] 08/03/19.  08/16/19:No acute events ON.Continue to monitorsome of his meds leading to drowsiness. IV abx through 09/06/19; need SNF, but no insurance and doesn't seem motivated;per case manager he will need to stay inpatient through 09/06/19 since he does not have supportto go home and do PICC, finally worked with PT for first time on 08/15/19.  Assessment & Plan:   Principal Problem:   MRSA bacteremia Active Problems:   Septic joint ([place])   Atrial fibrillation with rapid ventricular response (HCC)   AKI (acute kidney injury) ([place])   Hyponatremia   Severe sepsis (HCC)   Severe sepsis with acute organ dysfunction (HCC)   Respiratory insufficiency   Acute pain of left knee   Prosthetic joint infection (HCC)   Tachypnea   Acute bilateral low back pain without sciatica   Left arm swelling   Red man syndrome   Acute metabolic encephalopathy   S/P revision of total knee   Malnutrition of moderate degree   Acute respiratory failure (HCC)  Acute Afib: - seen by cards - eliquis, metoprolol, amiodarone - continue current tx, he is stable  MRSA bacteremia L knee septic joint - Bld Cx from 07/30/19 neg - on vanc thru2/9/21 - TEE without vegetations.  - S/p knee replacement -called ortho1/14/21 and 08/12/19 as patient has concerns and wants to talk to [name] but [name] feels he needs to work with pain management and does not appear to have come to see the patient and address his concerns -Needs SNF, but has no insurance,likelyneed to stay through IV course  09/06/19 -called palliative care to see if they could do pain management consult but they are unable given circumstances  HTN - metoprolol XL 200 mg qday  Etoh abuse Acute encephalopathy (toxic vs metabolic)  -back on home xanax 1 mg qhs only -morphine MS 15 mg BID - oxycodone q 4 hr prn breakthrough pain -morphine IV pre-PT/OT to encourage activity - tolerating seroquel  DVT prophylaxis: Eliquis Code Status: Full Family Communication: patient only Disposition Plan: after 09/06/19  Consultants:   Orthopedics  Procedures:  Left knee surgery  Antimicrobials:  Vancomycin stop date 09/06/19   Subjective: Did not sleep well last night due to pain. Did work with PT yesterday for the first real time since being here. Pain afterwards and nurse was unable to get in touch with me as she was using epic messaging not pager. Pain in the leg only. Denies new problems such as chest pains, SOB, cough. Denies abdominal pain or diarrhea or constipation. Admits to using opioids inappropriately prior to hospital including up to 10-15 percocet 10/325 per day.   Objective: Vitals:   08/15/19 1003 08/15/19 1400 08/15/19 2007 08/16/19 0500  BP: (!) 140/91 (!) 145/90 130/81   Pulse: 76 93 92   Resp:  20 18   Temp:  98.4 F (36.9 C)    TempSrc:  Oral    SpO2:  99% 99%   Weight:    104 kg  Height:        Intake/Output Summary (Last 24 hours) at 08/16/2019 1113 Last data filed at 08/16/2019  0900 Gross per 24 hour  Intake 793 ml  Output 600 ml  Net 193 ml   Filed Weights   08/11/19 0600 08/14/19 0500 08/16/19 0500  Weight: 103.8 kg 103.8 kg 104 kg    Examination:  General exam: Appears irritated and in pain Respiratory system: Clear to auscultation. Respiratory effort normal. Cardiovascular system: S1 & S2 heard, RRR. No JVD, murmurs, rubs, gallops or clicks. No pedal edema. Gastrointestinal system: Abdomen is nondistended, soft and nontender. No organomegaly or  masses felt. Normal bowel sounds heard. Central nervous system: Alert and oriented. No focal neurological deficits. Extremities: right 5 x 5 power, left able to move but 3/5. Skin: No rashes, lesions or ulcers Psychiatry: Judgement and insight appear stable. Mood & affect appropriate.   Data Reviewed: I have personally reviewed following labs and imaging studies  CBC: Recent Labs  Lab 08/10/19 0352 08/11/19 0332 08/12/19 0430 08/13/19 0413 08/14/19 0840  WBC 11.2* 10.8* 10.4 11.1* 10.2  NEUTROABS 7.8*  --   --   --   --   HGB 9.0* 9.3* 9.4* 9.2* 9.6*  HCT 29.6* 29.9* 30.8* 30.1* 31.4*  MCV 93.7 93.1 93.9 92.0 92.1  PLT 337 325 342 319 [id number]   Basic Metabolic Panel: Recent Labs  Lab 08/10/19 0352 08/11/19 0332 08/13/19 0413 08/14/19 0840  NA 135 136 134* 135  K 4.4 4.4 4.4 4.5  CL 97* 98 97* 101  CO2 28 28 27 25   GLUCOSE 180* 193* 163* 172*  BUN 18 18 19 18   CREATININE 0.71 0.86 0.68 0.70  CALCIUM 8.9 9.0 9.0 9.2  MG 1.9  --   --   --   PHOS 3.5  --   --   --    GFR: Estimated Creatinine Clearance: 131.4 mL/min (by C-G formula based on SCr of 0.7 mg/dL). Liver Function Tests: Recent Labs  Lab 08/10/19 0352  ALBUMIN 2.1*   No results for input(s): LIPASE, AMYLASE in the last 168 hours. No results for input(s): AMMONIA in the last 168 hours. Coagulation Profile: No results for input(s): INR, PROTIME in the last 168 hours. Cardiac Enzymes: No results for input(s): CKTOTAL, CKMB, CKMBINDEX, TROPONINI in the last 168 hours. BNP (last 3 results) No results for input(s): PROBNP in the last 8760 hours. HbA1C: No results for input(s): HGBA1C in the last 72 hours. CBG: Recent Labs  Lab 08/15/19 0740 08/15/19 1208 08/15/19 1745 08/15/19 2024 08/16/19 0812  GLUCAP 131* 206* 120* 173* 151*   Lipid Profile: No results for input(s): CHOL, HDL, LDLCALC, TRIG, CHOLHDL, LDLDIRECT in the last 72 hours. Thyroid Function Tests: No results for input(s): TSH, T4TOTAL,  FREET4, T3FREE, THYROIDAB in the last 72 hours. Anemia Panel: No results for input(s): VITAMINB12, FOLATE, FERRITIN, TIBC, IRON, RETICCTPCT in the last 72 hours. Sepsis Labs: No results for input(s): PROCALCITON, LATICACIDVEN in the last 168 hours.  No results found for this or any previous visit (from the past 240 hour(s)).  Radiology Studies: No results found. Scheduled Meds: . acetaminophen  650 mg Oral Q6H  . amiodarone  200 mg Oral Daily  . apixaban  5 mg Oral BID  . chlorhexidine  15 mL Mouth Rinse BID  . Chlorhexidine Gluconate Cloth  6 each Topical Daily  . docusate sodium  100 mg Oral BID  . feeding supplement (ENSURE ENLIVE)  237 mL Oral BID BM  . folic acid  1 mg Oral Daily  . gabapentin  300 mg Oral TID  . insulin aspart  0-6 Units Subcutaneous TID AC & HS  . lisinopril  10 mg Oral Daily  . magnesium oxide  400 mg Oral BID  . mouth rinse  15 mL Mouth Rinse q12n4p  . metoprolol succinate  200 mg Oral Daily  . morphine  15 mg Oral Q12H  . multivitamin with minerals  1 tablet Oral Daily  . pantoprazole  40 mg Oral Daily  . psyllium  1 packet Oral Daily  . QUEtiapine  25 mg Oral BID  . sodium chloride flush  10-40 mL Intracatheter Q12H  . thiamine  100 mg Oral Daily   Continuous Infusions: . vancomycin 1,000 mg (08/16/19 0411)    LOS: 34 days   Time spent: 25  Hoyt Koch, MD [facility] Pager [phone]  If 7PM-7AM, please contact night-coverage [email or web address] Password [email or web address] 08/16/2019, 11:13 AM

## 2019-08-16 NOTE — Progress Notes (Signed)
Occupational Therapy Treatment Patient Details Name: Vernon Frye MRN: [id number] DOB: [birth year] Today's Date: 08/16/2019    History of present illness 59 yo M PMH Afib, HTN, L TKR (2016) who presented to ED with Lt knee pain. 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 Desaturated with hypoxemic respiratory failure requiring 3LNC. Pt underwent L TKA removal with placement of antibiotic spacers on 12/29, agitated after procedure and unable to be extubated. Extubated 07/31/19.   OT comments  Coordinated with nursing for IV pain medication prior to OT, however upon re-entering room approximately 30 mins later patient reports pain medication has "worn off," however still agreeable and pleasant with OT. With knee immobilizer donned OT provide assist supporting L LE to edge of bed before mod A for trunk support to sitting position. After resting from bed mobility patient agreeable to seated grooming/hygiene stating "it feels good to sit up." patient supervision for brushing his teeth, washing his face and tolerate sitting up at edge of bed for approx 20 minutes total. Patient min A with min cues for technique to scoot up towards head of bed and mod A to support L LE onto bed to return to supine. Patient progressing towards goals, will continue to follow.    Follow Up Recommendations  SNF;Supervision/Assistance - 24 hour    Equipment Recommendations  Other (comment)(defer to next venue)       Precautions / Restrictions Precautions Precautions: Fall Precaution Comments: watch HR Required Braces or Orthoses: Knee Immobilizer - Left Knee Immobilizer - Left: On at all times Restrictions Weight Bearing Restrictions: Yes LLE Weight Bearing: Touchdown weight bearing Other Position/Activity Restrictions: No L knee ROM       Mobility Bed Mobility Overal bed mobility: Needs Assistance Bed Mobility: Supine to Sit;Sit to  Supine     Supine to sit: Mod assist Sit to supine: Mod assist   General bed mobility comments: OT provide support to L LE during bed mobility and mod A for trunk support from supine to sit, patient able to lay himself back into bed with HOB elevated and L LE supported on bed. Min A to scoot up towards [facility]  Transfers Overall transfer level: Needs assistance               General transfer comment: deferred     Balance Overall balance assessment: Needs assistance Sitting-balance support: No upper extremity supported;Feet supported Sitting balance-Leahy Scale: Good Sitting balance - Comments: supervision during ADLS       Standing balance comment: deferred                           ADL either performed or assessed with clinical judgement   ADL Overall ADL's : Needs assistance/impaired     Grooming: Set up;Oral care;Wash/dry face;Sitting           Upper Body Dressing : Minimal assistance;Sitting Upper Body Dressing Details (indicate cue type and reason): don clean down, assist with IV site in R UE                   General ADL Comments: patient tolerate sitting edge of bed for approx 20 minutes, increased time for rest after bed mobility then participate in self care               Cognition Arousal/Alertness: Awake/alert Behavior During Therapy: [facility] for tasks assessed/performed Overall Cognitive Status: Within  Functional Limits for tasks assessed                                 General Comments: patient is tangential however follows directions appropriately this session                   Pertinent Vitals/ Pain       Pain Assessment: Faces Faces Pain Scale: Hurts even more Pain Location: L LE with activity Pain Descriptors / Indicators: Aching;Guarding;Discomfort;Grimacing;Moaning Pain Intervention(s): Premedicated before session;Monitored during session;Limited activity within patient's tolerance          Frequency  Min 2X/week        Progress Toward Goals  OT Goals(current goals can now be found in the care plan section)  Progress towards OT goals: Progressing toward goals  Acute Rehab OT Goals Patient Stated Goal: get stronger OT Goal Formulation: With patient Time For Goal Achievement: 08/30/19 Potential to Achieve Goals: Good ADL Goals Pt Will Perform Grooming: with min assist;standing Pt Will Perform Upper Body Bathing: with set-up;sitting Pt Will Transfer to Toilet: with mod assist;squat pivot transfer;stand pivot transfer;anterior/posterior transfer;bedside commode Pt Will Perform Toileting - Clothing Manipulation and hygiene: with mod assist;sit to/from stand Additional ADL Goal #1: Pt will perform bed mobility with min assist in preparation for ADL.  Plan Discharge plan remains appropriate       AM-PAC OT "6 Clicks" Daily Activity     Outcome Measure   Help from another person eating meals?: A Little Help from another person taking care of personal grooming?: A Little Help from another person toileting, which includes using toliet, bedpan, or urinal?: Total Help from another person bathing (including washing, rinsing, drying)?: A Lot Help from another person to put on and taking off regular upper body clothing?: A Little Help from another person to put on and taking off regular lower body clothing?: Total 6 Click Score: 13    End of Session  OT Visit Diagnosis: Muscle weakness (generalized) (M62.81);Pain;Other symptoms and signs involving cognitive function;Unsteadiness on feet (R26.81) Pain - Right/Left: Left Pain - part of body: Knee;Leg   Activity Tolerance Patient tolerated treatment well   Patient Left in bed;with call bell/phone within reach   Nurse Communication Patient requests pain meds        Time: 1015-1055 OT Time Calculation (min): 40 min  Charges: OT General Charges $OT Visit: 1 Visit OT Treatments $Self Care/Home Management : 38-52  mins  [place] OT office: [place] 08/16/2019, 12:34 PM

## 2019-08-16 NOTE — Progress Notes (Signed)
Nutrition Follow-up  DOCUMENTATION CODES:   Non-severe (moderate) malnutrition in context of social or environmental circumstances  INTERVENTION:   -Continue Ensure Enlive po BID, each supplement provides 350 kcal and 20 grams of protein -Continue MVI with minerals daily  NUTRITION DIAGNOSIS:   Moderate Malnutrition related to social / environmental circumstances as evidenced by percent weight loss, mild muscle depletion.  Ongoing  GOAL:   Patient will meet greater than or equal to 90% of their needs  Progressing   MONITOR:   PO intake, Supplement acceptance, Labs, Weight trends, Skin, I & O's  REASON FOR ASSESSMENT:   Ventilator, Consult Enteral/tube feeding initiation and management  ASSESSMENT:   Patient with PMH significant for HTN, L TKR (2016), and Afib. Presents this admission with septic arthritis of L new and new onset AF with RVR.  12/16- s/p I&D total L knee  12/29- s/p total L knee revision/replacement 1/3- extubated 1/5- s/p TEE- no vegetations; successful DCCV  Reviewed I/O's: -504 ml x 24 hours and -36.2 L since 08/02/19  UOP: 1.4 L x 24 hours  Pt sleeping soundly at time of visit. Per RN, pt with good appetite and drinking Ensure supplements. Noted meal completion 50-100%.   Pt with increased nutrient needs and would benefit from nutrient dense supplement. One Ensure Enlive supplement provides 350 kcals, 20 grams protein, and 44-45 grams of carbohydrate vs one Glucerna shake supplement, which provides 220 kcals, 10 grams of protein, and 26 grams of carbohydrate. Given pt's hx of DM, RD will continue to monitor PO intake, CBGS, and adjust supplement regimen as appropriate.   Per MD notes, pt will be hospitalized until 09/06/19 for completion of IV antibiotics.   Labs reviewed: CBGS: 120-206 (inpatient orders for glycemic control are 0-6 units insulin aspart TID with meals and at bedtime).   Diet Order:   Diet Order            Diet Carb Modified  Fluid consistency: Thin; Room service appropriate? Yes with Assist  Diet effective now              EDUCATION NEEDS:   Not appropriate for education at this time  Skin:  Skin Assessment: Skin Integrity Issues: Skin Integrity Issues:: Incisions, Other (Comment) Incisions: L knee/L leg Other: MASD-buttocks, scrotum  Last BM:  08/15/19  Height:   Ht Readings from Last 1 Encounters:  07/13/19 6\' 3"  (1.905 m)    Weight:   Wt Readings from Last 1 Encounters:  08/16/19 104 kg    Ideal Body Weight:  89.1 kg  BMI:  Body mass index is 28.66 kg/m.  Estimated Nutritional Needs:   Kcal:  2300-2500 kcal  Protein:  115-130 grams  Fluid:  >/= 2.3 L/day    Barabara Motz A. Jimmye Norman, RD, LDN, [place] Registered Dietitian II Certified Diabetes Care and Education Specialist Pager: [phone] After hours Pager: [phone]

## 2019-08-16 NOTE — Plan of Care (Signed)
  Problem: Education: Goal: Knowledge of General Education information will improve Description Including pain rating scale, medication(s)/side effects and non-pharmacologic comfort measures Outcome: Progressing   

## 2019-08-17 LAB — BASIC METABOLIC PANEL
Anion gap: 9 (ref 5–15)
BUN: 24 mg/dL — ABNORMAL HIGH (ref 6–20)
CO2: 28 mmol/L (ref 22–32)
Calcium: 8.8 mg/dL — ABNORMAL LOW (ref 8.9–10.3)
Chloride: 97 mmol/L — ABNORMAL LOW (ref 98–111)
Creatinine, Ser: 0.79 mg/dL (ref 0.61–1.24)
GFR calc Af Amer: >60 mL/min (ref >60)
GFR calc non Af Amer: >60 mL/min (ref >60)
Glucose, Bld: 163 mg/dL — ABNORMAL HIGH (ref 70–99)
Potassium: 4 mmol/L (ref 3.5–5.1)
Sodium: 134 mmol/L — ABNORMAL LOW (ref 135–145)

## 2019-08-17 LAB — CBC
HCT: 29.7 % — ABNORMAL LOW (ref 39.0–52.0)
Hemoglobin: 9 g/dL — ABNORMAL LOW (ref 13.0–17.0)
MCH: 28.5 pg (ref 26.0–34.0)
MCHC: 30.3 g/dL (ref 30.0–36.0)
MCV: 94 fL (ref 80.0–100.0)
Platelets: 322 10*3/uL (ref 150–400)
RBC: 3.16 MIL/uL — ABNORMAL LOW (ref 4.22–5.81)
RDW: 15.5 % (ref 11.5–15.5)
WBC: 8.1 10*3/uL (ref 4.0–10.5)
nRBC: 0 % (ref 0.0–0.2)

## 2019-08-17 LAB — GLUCOSE, CAPILLARY
Glucose-Capillary: 172 mg/dL — ABNORMAL HIGH (ref 70–99)
Glucose-Capillary: 153 mg/dL — ABNORMAL HIGH (ref 70–99)
Glucose-Capillary: 156 mg/dL — ABNORMAL HIGH (ref 70–99)
Glucose-Capillary: 124 mg/dL — ABNORMAL HIGH (ref 70–99)

## 2019-08-17 NOTE — Progress Notes (Signed)
Dressing changed as ordered. Pt tolerated well. Will continue to monitor.

## 2019-08-17 NOTE — Progress Notes (Signed)
Physical Therapy Treatment Patient Details Name: Vernon Frye MRN: [id number] DOB: 1961-03-16 Today's Date: 08/17/2019    History of Present Illness 59 yo M PMH Afib, HTN, L TKR (2016) who presented to ED with Lt knee pain. 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 Desaturated with hypoxemic respiratory failure requiring 3LNC. Pt underwent L TKA removal with placement of antibiotic spacers on 12/29, agitated after procedure and unable to be extubated. Extubated 07/31/19.    PT Comments    Pt in bed upon arrival of PT, agreeable to PT session focused on mobility progression. The pt continues to present with limitations in functional mobility, strength, stability compared to their prior level of function and independence due to above dx. However, the pt demos improved tolerance for sit-stand transfers (requires slightly less assistance) and was able to take small shuffle steps to the R to initiate gait. The pt continues to rely heavily on BUE support as well as assist of 2 people, but was able to tolerate significantly more activity today than in past sessions. The pt will continue to benefit from skilled PT to maximize rehab, functional mobility, functional independence, and strength.    Follow Up Recommendations  SNF;Supervision/Assistance - 24 hour     Equipment Recommendations  Wheelchair (measurements PT);Wheelchair cushion (measurements PT);Hospital bed;3in1 (PT)    Recommendations for Other Services       Precautions / Restrictions Precautions Precautions: Fall Precaution Comments: watch HR Required Braces or Orthoses: Knee Immobilizer - Left Knee Immobilizer - Left: On at all times Restrictions Weight Bearing Restrictions: Yes LLE Weight Bearing: Touchdown weight bearing Other Position/Activity Restrictions: No L knee ROM    Mobility  Bed Mobility Overal bed mobility: Needs Assistance Bed Mobility:  Supine to Sit;Sit to Supine     Supine to sit: Mod assist Sit to supine: Mod assist   General bed mobility comments: pt able to come to sitting EOB, requires modA of LLE as well as modA to elevate trunk from bed. Pt is able to perform scooting to reposition at EOB and back in bed  Transfers Overall transfer level: Needs assistance Equipment used: Rolling walker (2 wheeled) Transfers: Sit to/from Stand Sit to Stand: +2 physical assistance;Mod assist         General transfer comment: pt able to stand with +2 assist, poor following of TDWB status LLE, pt seems to put more wt on LLE than instructed, will not allow therapist to place foot under it to ensure WB status as he feels this puts him "on an incline" from which he can no longer stand up  Ambulation/Gait Ambulation/Gait assistance: Mod assist;+2 physical assistance Gait Distance (Feet): 4 Feet Assistive device: Rolling walker (2 wheeled) Gait Pattern/deviations: Step-to pattern;Shuffle   Gait velocity interpretation: <1.31 ft/sec, indicative of household ambulator General Gait Details: pt is unable to clear RLE at all while maintaining TDWB LLE, but is able to wiggle/shuffle RLE to the side and move RW to ambulate laterally to the R   Stairs             Wheelchair Mobility    Modified Rankin (Stroke Patients Only)       Balance Overall balance assessment: Needs assistance Sitting-balance support: No upper extremity supported;Feet supported Sitting balance-Leahy Scale: Good Sitting balance - Comments: supervision during ADLS   Standing balance support: Bilateral upper extremity supported;During functional activity Standing balance-Leahy Scale: Poor Standing balance comment: modA to stand, instability due  to TDWB status/pain                            Cognition Arousal/Alertness: Awake/alert Behavior During Therapy: WFL for tasks assessed/performed Overall Cognitive Status: Within Functional Limits  for tasks assessed Area of Impairment: Memory;Following commands;Attention;Safety/judgement;Awareness                   Current Attention Level: Sustained Memory: Decreased recall of precautions Following Commands: Follows multi-step commands inconsistently Safety/Judgement: Decreased awareness of safety;Decreased awareness of deficits Awareness: Intellectual Problem Solving: Slow processing;Decreased initiation;Difficulty sequencing;Requires verbal cues;Requires tactile cues General Comments: patient is tangential however follows directions appropriately this session, verbalizes wb status, poor carryover to functional application of WB status.      Exercises General Exercises - Lower Extremity Ankle Circles/Pumps: AAROM;Left;Supine;10 reps Other Exercises Other Exercises: 5x STS, modA of 2 with RW and elevated bed    General Comments        Pertinent Vitals/Pain Pain Assessment: Faces Faces Pain Scale: Hurts even more Pain Location: L LE with activity Pain Descriptors / Indicators: Aching;Guarding;Discomfort;Grimacing;Moaning Pain Intervention(s): Limited activity within patient's tolerance;Premedicated before session;RN gave pain meds during session;Monitored during session    Home Living                      Prior Function            PT Goals (current goals can now be found in the care plan section) Acute Rehab PT Goals Patient Stated Goal: get stronger PT Goal Formulation: With patient Time For Goal Achievement: 08/12/19 Potential to Achieve Goals: Fair Additional Goals Additional Goal #1: Pt will mobilize in manual wheelchair for 50' with use of BUE and minA. Progress towards PT goals: Progressing toward goals    Frequency    Min 3X/week      PT Plan Current plan remains appropriate    Co-evaluation              AM-PAC PT "6 Clicks" Mobility   Outcome Measure  Help needed turning from your back to your side while in a flat bed  without using bedrails?: A Lot Help needed moving from lying on your back to sitting on the side of a flat bed without using bedrails?: A Lot Help needed moving to and from a bed to a chair (including a wheelchair)?: A Lot Help needed standing up from a chair using your arms (e.g., wheelchair or bedside chair)?: A Lot Help needed to walk in hospital room?: A Lot Help needed climbing 3-5 steps with a railing? : Total 6 Click Score: 11    End of Session Equipment Utilized During Treatment: Left knee immobilizer;Gait belt Activity Tolerance: Patient tolerated treatment well Patient left: in bed;with call bell/phone within reach;with bed alarm set Nurse Communication: Mobility status;Precautions;Weight bearing status PT Visit Diagnosis: Other abnormalities of gait and mobility (R26.89);Pain Pain - Right/Left: Left Pain - part of body: Knee;Leg     Time: [id number] PT Time Calculation (min) (ACUTE ONLY): 27 min  Charges:  $Gait Training: 8-22 mins $Therapeutic Activity: 8-22 mins                     Karma Ganja, PT, DPT   Acute Rehabilitation Department Pager #: [phone]   Otho Bellows 08/17/2019, 3:47 PM

## 2019-08-17 NOTE — Progress Notes (Signed)
PROGRESS NOTE    Vernon Frye  [id number] DOB: [birth year] DOA: 07/12/2019 PCP: Nicholes Rough, PA-C   Brief Narrative:  59 yo M presenting with left knee swelling. Found to have septic arthritis of the knee. Seen by orthopedics for left total knee arthroplasty. Required intubation. Found to have new onset a fib RVR. Seen by cardiology. Had DCCV. Improving. Transferred to [facility] 08/03/19.  08/16/19:No acute events ON.Continue to monitorsome of his meds leading to drowsiness. IV abx through 09/06/19; need SNF, but no insurance and doesn't seem motivated;per case manager he will need to stay inpatient through 09/06/19 since he does not have supportto go home and do PICC, finally worked with PT for first time on 08/15/19   Assessment & Plan:   Principal Problem:   MRSA bacteremia Active Problems:   Septic joint ([place])   Atrial fibrillation with rapid ventricular response ([place])   AKI (acute kidney injury) ([place])   Hyponatremia   Severe sepsis ([place])   Severe sepsis with acute organ dysfunction ([facility])   Respiratory insufficiency   Acute pain of left knee   Prosthetic joint infection ([place])   Tachypnea   Acute bilateral low back pain without sciatica   Left arm swelling   Red man syndrome   Acute metabolic encephalopathy   S/P revision of total knee   Malnutrition of moderate degree   Acute respiratory failure ([place])  1 new onset atrial fibrillation Patient seen in consultation by cardiology.  Patient underwent DCCV and noted to have an enlarged right ventricle.  Patient currently on amiodarone and metoprolol for rate control.  Eliquis for anticoagulation.  Will need outpatient follow-up with cardiology.  2.  MRSA bacteremia/left knee septic joint Repeat blood cultures from 07/30/2019 -.  TEE done negative for any vegetations.  Orthopedics was called as patient had concerns and wanted to speak with [name] per orthopedics however per prior physician it was felt that [name] feels patient  needs to work with pain management.  Continue current dose of IV vancomycin through 09/06/2019.  Patient needs SNF however no insurance and will likely stay in-house to complete course of IV antibiotics through 09/06/2019.  Palliative care was called to help with pain management but supposedly unable to at this time.  Continue current pain regimen.  Follow.  3.  Hypertension Currently controlled.  Continue Toprol-XL.  4.  Acute metabolic encephalopathy Improved.  Patient currently on home regimen of Xanax 1 mg nightly.  Continue current pain regimen of MS Contin 15 mg twice daily and oxycodone 4 mg as needed breakthrough pain.  Continue Seroquel.  Follow.  5.  Polysubstance abuse/history of alcohol abuse Patient currently with no symptoms of withdrawal.  Stable.  Supportive care.  6.  Lower extremity edema Lower extremity Dopplers negative for DVT.  Patient auto diuresing with a urine output of 3.950 L over the past 24 hours.  Patient is -39 L during this hospitalization.  Follow.   DVT prophylaxis: Eliquis Code Status: Full Family Communication: Updated patient.  No family at bedside. Disposition Plan: SNF.  Likely remain in-house until IV antibiotics are completed on 09/06/2019   Consultants:   Orthopedics: [name] 07/13/2019  Cardiology: Dr. Daneen Schick 07/13/2019  Procedures:   CT head 07/24/2019  CT left knee 07/23/2019  PICC line placement  Renal ultrasound 07/13/2019  MRI L-spine 07/18/2019  2D echo 07/16/2019  TEE 08/02/2019  Left lower extremity Doppler 07/29/2019, 07/13/2019  Upper extremity Dopplers 07/29/2019, 07/21/2019  Left knee aspiration at bedside  07/13/2019 per [name]  Irrigation and debridement left total knee with polyexchange per [name] 07/13/2019  Antimicrobials:   IV vancomycin 07/25/2019>>>>> 09/06/2019   Subjective: Patient lying in bed.  Denies any chest pain or shortness of breath.  Complains of left knee pain.  Objective: Vitals:    08/16/19 0500 08/16/19 1434 08/16/19 2132 08/17/19 0519  BP:  109/71 121/75 105/78  Pulse:  85 82 77  Resp:  18 18 14   Temp:  97.9 F (36.6 C) 98.8 F (37.1 C) 98.2 F (36.8 C)  TempSrc:  Axillary Oral Oral  SpO2:  99% 96% 100%  Weight: 104 kg     Height:        Intake/Output Summary (Last 24 hours) at 08/17/2019 1236 Last data filed at 08/17/2019 1206 Gross per 24 hour  Intake 715.97 ml  Output 4750 ml  Net -4034.03 ml   Filed Weights   08/11/19 0600 08/14/19 0500 08/16/19 0500  Weight: 103.8 kg 103.8 kg 104 kg    Examination:  General exam: Appears calm and comfortable  Respiratory system: Clear to auscultation anterior lung fields.[name] Kitchen Respiratory effort normal. Cardiovascular system: S1 & S2 heard, RRR. No JVD, murmurs, rubs, gallops or clicks.  2-3+ left lower extremity edema.  Gastrointestinal system: Abdomen is nondistended, soft and nontender. No organomegaly or masses felt. Normal bowel sounds heard. Central nervous system: Alert and oriented. No focal neurological deficits. Extremities: Left lower extremity in knee immobilizer.  2-3+ left lower extremity edema.  Skin: No rashes, lesions or ulcers Psychiatry: Judgement and insight appear normal. Mood & affect appropriate.     Data Reviewed: I have personally reviewed following labs and imaging studies  CBC: Recent Labs  Lab 08/11/19 0332 08/12/19 0430 08/13/19 0413 08/14/19 0840 08/17/19 0356  WBC 10.8* 10.4 11.1* 10.2 8.1  HGB 9.3* 9.4* 9.2* 9.6* 9.0*  HCT 29.9* 30.8* 30.1* 31.4* 29.7*  MCV 93.1 93.9 92.0 92.1 94.0  PLT 325 342 319 319 [id number]   Basic Metabolic Panel: Recent Labs  Lab 08/11/19 0332 08/13/19 0413 08/14/19 0840 08/17/19 0356  NA 136 134* 135 134*  K 4.4 4.4 4.5 4.0  CL 98 97* 101 97*  CO2 28 27 25 28   GLUCOSE 193* 163* 172* 163*  BUN 18 19 18  24*  CREATININE 0.86 0.68 0.70 0.79  CALCIUM 9.0 9.0 9.2 8.8*   GFR: Estimated Creatinine Clearance: 131.4 mL/min (by C-G formula based on  SCr of 0.79 mg/dL). Liver Function Tests: No results for input(s): AST, ALT, ALKPHOS, BILITOT, PROT, ALBUMIN in the last 168 hours. No results for input(s): LIPASE, AMYLASE in the last 168 hours. No results for input(s): AMMONIA in the last 168 hours. Coagulation Profile: No results for input(s): INR, PROTIME in the last 168 hours. Cardiac Enzymes: No results for input(s): CKTOTAL, CKMB, CKMBINDEX, TROPONINI in the last 168 hours. BNP (last 3 results) No results for input(s): PROBNP in the last 8760 hours. HbA1C: No results for input(s): HGBA1C in the last 72 hours. CBG: Recent Labs  Lab 08/16/19 1151 08/16/19 1635 08/16/19 2135 08/17/19 0806 08/17/19 1201  GLUCAP 155* 151* 168* 172* 153*   Lipid Profile: No results for input(s): CHOL, HDL, LDLCALC, TRIG, CHOLHDL, LDLDIRECT in the last 72 hours. Thyroid Function Tests: No results for input(s): TSH, T4TOTAL, FREET4, T3FREE, THYROIDAB in the last 72 hours. Anemia Panel: No results for input(s): VITAMINB12, FOLATE, FERRITIN, TIBC, IRON, RETICCTPCT in the last 72 hours. Sepsis Labs: No results for input(s): PROCALCITON, LATICACIDVEN in the last  168 hours.  No results found for this or any previous visit (from the past 240 hour(s)).       Radiology Studies: No results found.      Scheduled Meds: . acetaminophen  650 mg Oral Q6H  . amiodarone  200 mg Oral Daily  . apixaban  5 mg Oral BID  . chlorhexidine  15 mL Mouth Rinse BID  . Chlorhexidine Gluconate Cloth  6 each Topical Daily  . docusate sodium  100 mg Oral BID  . feeding supplement (ENSURE ENLIVE)  237 mL Oral BID BM  . folic acid  1 mg Oral Daily  . gabapentin  300 mg Oral TID  . insulin aspart  0-6 Units Subcutaneous TID AC & HS  . lisinopril  10 mg Oral Daily  . magnesium oxide  400 mg Oral BID  . mouth rinse  15 mL Mouth Rinse q12n4p  . metoprolol succinate  200 mg Oral Daily  . morphine  15 mg Oral Q12H  . multivitamin with minerals  1 tablet Oral  Daily  . pantoprazole  40 mg Oral Daily  . psyllium  1 packet Oral Daily  . QUEtiapine  25 mg Oral BID  . sodium chloride flush  10-40 mL Intracatheter Q12H  . thiamine  100 mg Oral Daily   Continuous Infusions: . vancomycin 1,000 mg (08/17/19 0326)     LOS: 35 days    Time spent: 35 minutes    Irine Seal, MD [facility]  If 7PM-7AM, please contact night-coverage [email or web address] 08/17/2019, 12:36 PM

## 2019-08-17 NOTE — Plan of Care (Signed)

## 2019-08-18 LAB — CBC
HCT: 28.4 % — ABNORMAL LOW (ref 39.0–52.0)
Hemoglobin: 8.7 g/dL — ABNORMAL LOW (ref 13.0–17.0)
MCH: 28.2 pg (ref 26.0–34.0)
MCHC: 30.6 g/dL (ref 30.0–36.0)
MCV: 92.2 fL (ref 80.0–100.0)
Platelets: 291 10*3/uL (ref 150–400)
RBC: 3.08 MIL/uL — ABNORMAL LOW (ref 4.22–5.81)
RDW: 15.3 % (ref 11.5–15.5)
WBC: 6.7 10*3/uL (ref 4.0–10.5)
nRBC: 0 % (ref 0.0–0.2)

## 2019-08-18 LAB — GLUCOSE, CAPILLARY
Glucose-Capillary: 114 mg/dL — ABNORMAL HIGH (ref 70–99)
Glucose-Capillary: 132 mg/dL — ABNORMAL HIGH (ref 70–99)
Glucose-Capillary: 137 mg/dL — ABNORMAL HIGH (ref 70–99)
Glucose-Capillary: 116 mg/dL — ABNORMAL HIGH (ref 70–99)

## 2019-08-18 LAB — VANCOMYCIN, PEAK
Vancomycin Pk: 119 ug/mL (ref 30–40)
Vancomycin Pk: 22 ug/mL — ABNORMAL LOW (ref 30–40)

## 2019-08-18 LAB — BASIC METABOLIC PANEL
Anion gap: 9 (ref 5–15)
BUN: 22 mg/dL — ABNORMAL HIGH (ref 6–20)
CO2: 29 mmol/L (ref 22–32)
Calcium: 8.9 mg/dL (ref 8.9–10.3)
Chloride: 98 mmol/L (ref 98–111)
Creatinine, Ser: 0.86 mg/dL (ref 0.61–1.24)
GFR calc Af Amer: >60 mL/min (ref >60)
GFR calc non Af Amer: >60 mL/min (ref >60)
Glucose, Bld: 128 mg/dL — ABNORMAL HIGH (ref 70–99)
Potassium: 4.8 mmol/L (ref 3.5–5.1)
Sodium: 136 mmol/L (ref 135–145)

## 2019-08-18 LAB — VANCOMYCIN, TROUGH: Vancomycin Tr: 17 ug/mL (ref 15–20)

## 2019-08-18 MED ORDER — VANCOMYCIN HCL 750 MG/150ML IV SOLN
750.0000 mg | Freq: Two times a day (BID) | INTRAVENOUS | Status: DC
  Administered 2019-08-18 – 2019-08-19 (×2): 750 mg via INTRAVENOUS
  Filled 2019-08-18 (×2): qty 150

## 2019-08-18 NOTE — Progress Notes (Signed)
CRITICAL VALUE ALERT  Critical Value:  vanco level 119  Date & Time Notied:  08/18/2019 at 0900  Provider Notified: Irine Seal  Orders Received/Actions taken: repeat vanco level by IV team

## 2019-08-18 NOTE — Progress Notes (Signed)
Physical Therapy Treatment Patient Details Name: Vernon Frye MRN: [id number] DOB: [birth year] Today's Date: 08/18/2019    History of Present Illness Pt is a 59 y/o M PMH Afib, HTN, L TKR (2016) who presented to ED with Lt knee pain. 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 Desaturated with hypoxemic respiratory failure requiring 3LNC. Pt underwent L TKA removal with placement of antibiotic spacers on 12/29, agitated after procedure and unable to be extubated. Extubated 07/31/19.    PT Comments    Pt continues to require heavy physical assistance of two for all mobility at this point. He continues to struggle with any attempt to pivot or hop on R LE while maintaining TDWB L LE. He remains limited secondary to pain and weakness of L LE despite being premedicated with IV pain meds. Pt would continue to benefit from skilled physical therapy services at this time while admitted and after d/c to address the below listed limitations in order to improve overall safety and independence with functional mobility.   Follow Up Recommendations  SNF;Supervision/Assistance - 24 hour     Equipment Recommendations  Wheelchair (measurements PT);Wheelchair cushion (measurements PT);Hospital bed;3in1 (PT)    Recommendations for Other Services       Precautions / Restrictions Precautions Precautions: Fall Required Braces or Orthoses: Knee Immobilizer - Left Knee Immobilizer - Left: On at all times Restrictions Weight Bearing Restrictions: Yes LLE Weight Bearing: Touchdown weight bearing Other Position/Activity Restrictions: No L knee ROM    Mobility  Bed Mobility Overal bed mobility: Needs Assistance Bed Mobility: Supine to Sit     Supine to sit: Mod assist     General bed mobility comments: assistance needed for L LE movement off of bed and for trunk elevation  Transfers Overall transfer level: Needs  assistance Equipment used: Rolling walker (2 wheeled) Transfers: Sit to/from Stand Sit to Stand: Min assist;+2 physical assistance;+2 safety/equipment;From elevated surface         General transfer comment: cueing for safe hand placement and technique, assistance needed to power into standing and for stability with transition  Ambulation/Gait             General Gait Details: pt able to pivot on R LE laterally at EOB towards [place] for ~2' with min A and RW   Stairs             Wheelchair Mobility    Modified Rankin (Stroke Patients Only)       Balance Overall balance assessment: Needs assistance Sitting-balance support: No upper extremity supported;Feet supported Sitting balance-Leahy Scale: Good     Standing balance support: Bilateral upper extremity supported;During functional activity Standing balance-Leahy Scale: Poor                              Cognition Arousal/Alertness: Awake/alert Behavior During Therapy: WFL for tasks assessed/performed Overall Cognitive Status: Within Functional Limits for tasks assessed                                        Exercises      General Comments        Pertinent Vitals/Pain Pain Assessment: Faces Faces Pain Scale: Hurts little more Pain Location: L LE with activity Pain Descriptors / Indicators: Guarding;Discomfort Pain Intervention(s): Monitored during session;Repositioned;Patient requesting pain  meds-RN notified;RN gave pain meds during session    Home Living                      Prior Function            PT Goals (current goals can now be found in the care plan section) Acute Rehab PT Goals PT Goal Formulation: With patient Time For Goal Achievement: 09/01/19 Potential to Achieve Goals: Fair Progress towards PT goals: Progressing toward goals    Frequency    Min 3X/week      PT Plan Current plan remains appropriate    Co-evaluation               AM-PAC PT "6 Clicks" Mobility   Outcome Measure  Help needed turning from your back to your side while in a flat bed without using bedrails?: A Lot Help needed moving from lying on your back to sitting on the side of a flat bed without using bedrails?: A Lot Help needed moving to and from a bed to a chair (including a wheelchair)?: A Lot Help needed standing up from a chair using your arms (e.g., wheelchair or bedside chair)?: A Lot Help needed to walk in hospital room?: Total Help needed climbing 3-5 steps with a railing? : Total 6 Click Score: 10    End of Session Equipment Utilized During Treatment: Left knee immobilizer;Gait belt Activity Tolerance: Patient tolerated treatment well Patient left: in bed;with call bell/phone within reach;Other (comment)(sitting EOB) Nurse Communication: Mobility status;Precautions;Weight bearing status PT Visit Diagnosis: Other abnormalities of gait and mobility (R26.89);Pain Pain - Right/Left: Left Pain - part of body: Knee;Leg     Time: 1129-1207 PT Time Calculation (min) (ACUTE ONLY): 38 min  Charges:  $Therapeutic Activity: 38-52 mins                     Anastasio Champion, DPT  Acute Rehabilitation Services Pager [phone] Office [place] 08/18/2019, 2:16 PM

## 2019-08-18 NOTE — Progress Notes (Addendum)
Pharmacy Antibiotic Note  Vernon Frye is a 59 y.o. male admitted on 07/12/2019 with MRSA bacteremia and septic knee joint . Pharmacy has been consulted for vancomycin dosing.  Vancomycin peak level was supratherapeutic 119 mcg/mL at 0850 this morning, but was found to be 22 mcg/mL after a redraw at 1046 - first level likely unreliable. Vanc trough level was 17 mcg/mL this afternoon with AUC at upper end of goal at 550 (goal 400-550). Patient is afebrile, no leukocytosis present. Patient has been receiving vancomycin 4-hour infusion to reduce risk of red man syndrome. Renal function stable with Scr 0.86 this morning.   12/16 BCx 2/2 MRSA 12/16L knee fluid: abundant MRSA 12/16 Ucx: neg 12/16 Grp A strep - neg 12/17 BCx >>1/2 SA 12/18 BCx >> ngF 1/2 Cdiff >> neg 1/2 UCx >>  >100k Klebsiella (no tx, asymptomatic) 1/2 BCx >> negative   Plan: Decrease vancomycin to 750 mg IV q12hr with estimated AUC 411. Continue to infuse over 4 hours to reduce risk of adverse reaction.  Plans 6 weeks abx (to stop 09/06/19; from removal of prosthetic material on 12/29) Monitor renal function, vancomycin levels as indicated   Height: 6\' 3"  (190.5 cm) Weight: 229 lb 0.9 oz (103.9 kg) IBW/kg (Calculated) : 84.5  Temp (24hrs), Avg:98.1 F (36.7 C), Min:98 F (36.7 C), Max:98.2 F (36.8 C)  Recent Labs  Lab 08/12/19 0430 08/13/19 0413 08/14/19 0830 08/14/19 0840 08/15/19 0839 08/15/19 1427 08/17/19 0356 08/18/19 0420 08/18/19 0850 08/18/19 1046 08/18/19 1434  WBC 10.4 11.1*  --  10.2  --   --  8.1 6.7  --   --   --   CREATININE  --  0.68  --  0.70  --   --  0.79 0.86  --   --   --   VANCOTROUGH  --   --   --   --   --  18  --   --   --   --  17  VANCOPEAK  --   --    < >  --    < >  --   --   --  119* 22*  --    < > = values in this interval not displayed.    Estimated Creatinine Clearance: 122.2 mL/min (by C-G formula based on SCr of 0.86 mg/dL).    Allergies  Allergen Reactions  .  Vancomycin Shortness Of Breath and Rash    [name]     Agnes Lawrence, PharmD PGY1 Pharmacy Resident

## 2019-08-18 NOTE — Progress Notes (Signed)
PROGRESS NOTE    Vernon Frye  [id number] DOB: November 03, 1960 DOA: 07/12/2019 PCP: Nicholes Rough, PA-C   Brief Narrative:  59 yo M presenting with left knee swelling. Found to have septic arthritis of the knee. Seen by orthopedics for left total knee arthroplasty. Required intubation. Found to have new onset a fib RVR. Seen by cardiology. Had DCCV. Improving. Transferred to [facility] 08/03/19.  08/16/19:No acute events ON.Continue to monitorsome of his meds leading to drowsiness. IV abx through 09/06/19; need SNF, but no insurance and doesn't seem motivated;per case manager he will need to stay inpatient through 09/06/19 since he does not have supportto go home and do PICC, finally worked with PT for first time on 08/15/19   Assessment & Plan:   Principal Problem:   MRSA bacteremia Active Problems:   Septic joint ([place])   Atrial fibrillation with rapid ventricular response ([place])   AKI (acute kidney injury) ([place])   Hyponatremia   Severe sepsis ([place])   Severe sepsis with acute organ dysfunction ([facility])   Respiratory insufficiency   Acute pain of left knee   Prosthetic joint infection ([place])   Tachypnea   Acute bilateral low back pain without sciatica   Left arm swelling   Red man syndrome   Acute metabolic encephalopathy   S/P revision of total knee   Malnutrition of moderate degree   Acute respiratory failure ([place])  1 new onset paroxysmal atrial fibrillation Patient seen in consultation by cardiology.  Patient underwent DCCV and noted to have an enlarged right ventricle.  Patient currently on amiodarone and metoprolol for rate control.  Eliquis for anticoagulation.  Will need outpatient follow-up with cardiology.  2.  MRSA bacteremia/left knee septic joint, POA Repeat blood cultures from 07/30/2019 -.  TEE done negative for any vegetations.  Orthopedics was called as patient had concerns and wanted to speak with [name] per orthopedics however per prior physician it was felt that Dr.  Alvan Dame feels patient needs to work with pain management.  Continue current dose of IV vancomycin through 09/06/2019.  Patient needs SNF however with no insurance patient will likely stay in-house to complete course of IV antibiotics through 09/06/2019.  Palliative care was called to help with pain management but supposedly unable to at this time.  Continue current pain regimen.  Follow.  3.  Hypertension Blood pressure borderline.  Patient auto diuresing. Continue Toprol-XL and lisinopril.  May need to discontinue lisinopril if blood pressure remains borderline.  Follow.   4.  Acute metabolic encephalopathy Improved.  Patient currently on home regimen of Xanax 1 mg nightly.  Continue current pain regimen of MS Contin 15 mg twice daily and oxycodone 4 mg as needed breakthrough pain.  Continue Seroquel.  Will likely keep pain regimen as is.  Follow.  5.  Polysubstance abuse/history of alcohol abuse Patient currently with no symptoms of withdrawal.  Stable.  Supportive care.  6.  Lower extremity edema Lower extremity Dopplers negative for DVT.  Patient auto diuresing with a urine output of 2.450 L over the past 24 hours.  Patient is -36 L during this hospitalization.  Follow.   DVT prophylaxis: Eliquis Code Status: Full Family Communication: Updated patient.  No family at bedside. Disposition Plan: SNF.  Likely remain in-house until IV antibiotics are completed on 09/06/2019   Consultants:   Orthopedics: [name] 07/13/2019  Cardiology: Dr. Daneen Schick 07/13/2019  Procedures:   CT head 07/24/2019  CT left knee 07/23/2019  PICC line placement  Renal ultrasound  07/13/2019  MRI L-spine 07/18/2019  2D echo 07/16/2019  TEE 08/02/2019  Left lower extremity Doppler 07/29/2019, 07/13/2019  Upper extremity Dopplers 07/29/2019, 07/21/2019  Left knee aspiration at bedside 07/13/2019 per [name]  Irrigation and debridement left total knee with polyexchange per [name]  07/13/2019  Antimicrobials:   IV vancomycin 07/25/2019>>>>> 09/06/2019   Subjective: Patient sitting up at bedside with urine on the floor.  Denies any chest pain.  Denies any abdominal pain.  States has had significant urine output over the past 24 hours.  Feels his left leg is less tight however still with complaints of left knee pain.   Objective: Vitals:   08/17/19 1539 08/17/19 2030 08/18/19 0446 08/18/19 0500  BP: 114/80 121/73 103/73   Pulse:  88 78   Resp:  17 19   Temp:  98.2 F (36.8 C) 98 F (36.7 C)   TempSrc:  Oral Oral   SpO2:  100% 98%   Weight:    103.9 kg  Height:        Intake/Output Summary (Last 24 hours) at 08/18/2019 1317 Last data filed at 08/18/2019 0932 Gross per 24 hour  Intake 1013.68 ml  Output 1650 ml  Net -636.32 ml   Filed Weights   08/14/19 0500 08/16/19 0500 08/18/19 0500  Weight: 103.8 kg 104 kg 103.9 kg    Examination:  General exam: NAD  Respiratory system: CTA B.  No wheezes, no crackles, no rhonchi.   Cardiovascular system: Regular rate and rhythm no murmurs rubs or gallops.  No JVD.  2-3+ left lower extremity edema.   Gastrointestinal system: Abdomen is soft, nontender, nondistended, positive bowel sounds.  No rebound.  No guarding.  Central nervous system: Alert and oriented. No focal neurological deficits. Extremities: Left lower extremity in knee immobilizer.  2-3+ left lower extremity edema.  Skin: No rashes, lesions or ulcers Psychiatry: Judgement and insight appear normal. Mood & affect appropriate.     Data Reviewed: I have personally reviewed following labs and imaging studies  CBC: Recent Labs  Lab 08/12/19 0430 08/13/19 0413 08/14/19 0840 08/17/19 0356 08/18/19 0420  WBC 10.4 11.1* 10.2 8.1 6.7  HGB 9.4* 9.2* 9.6* 9.0* 8.7*  HCT 30.8* 30.1* 31.4* 29.7* 28.4*  MCV 93.9 92.0 92.1 94.0 92.2  PLT 342 319 319 322 [id number]   Basic Metabolic Panel: Recent Labs  Lab 08/13/19 0413 08/14/19 0840 08/17/19 0356  08/18/19 0420  NA 134* 135 134* 136  K 4.4 4.5 4.0 4.8  CL 97* 101 97* 98  CO2 27 25 28 29   GLUCOSE 163* 172* 163* 128*  BUN 19 18 24* 22*  CREATININE 0.68 0.70 0.79 0.86  CALCIUM 9.0 9.2 8.8* 8.9   GFR: Estimated Creatinine Clearance: 122.2 mL/min (by C-G formula based on SCr of 0.86 mg/dL). Liver Function Tests: No results for input(s): AST, ALT, ALKPHOS, BILITOT, PROT, ALBUMIN in the last 168 hours. No results for input(s): LIPASE, AMYLASE in the last 168 hours. No results for input(s): AMMONIA in the last 168 hours. Coagulation Profile: No results for input(s): INR, PROTIME in the last 168 hours. Cardiac Enzymes: No results for input(s): CKTOTAL, CKMB, CKMBINDEX, TROPONINI in the last 168 hours. BNP (last 3 results) No results for input(s): PROBNP in the last 8760 hours. HbA1C: No results for input(s): HGBA1C in the last 72 hours. CBG: Recent Labs  Lab 08/17/19 1201 08/17/19 1735 08/17/19 2151 08/18/19 0700 08/18/19 1207  GLUCAP 153* 156* 124* 114* 132*   Lipid Profile: No results  for input(s): CHOL, HDL, LDLCALC, TRIG, CHOLHDL, LDLDIRECT in the last 72 hours. Thyroid Function Tests: No results for input(s): TSH, T4TOTAL, FREET4, T3FREE, THYROIDAB in the last 72 hours. Anemia Panel: No results for input(s): VITAMINB12, FOLATE, FERRITIN, TIBC, IRON, RETICCTPCT in the last 72 hours. Sepsis Labs: No results for input(s): PROCALCITON, LATICACIDVEN in the last 168 hours.  No results found for this or any previous visit (from the past 240 hour(s)).       Radiology Studies: No results found.      Scheduled Meds: . acetaminophen  650 mg Oral Q6H  . amiodarone  200 mg Oral Daily  . apixaban  5 mg Oral BID  . chlorhexidine  15 mL Mouth Rinse BID  . Chlorhexidine Gluconate Cloth  6 each Topical Daily  . docusate sodium  100 mg Oral BID  . feeding supplement (ENSURE ENLIVE)  237 mL Oral BID BM  . folic acid  1 mg Oral Daily  . gabapentin  300 mg Oral TID  .  insulin aspart  0-6 Units Subcutaneous TID AC & HS  . lisinopril  10 mg Oral Daily  . magnesium oxide  400 mg Oral BID  . mouth rinse  15 mL Mouth Rinse q12n4p  . metoprolol succinate  200 mg Oral Daily  . morphine  15 mg Oral Q12H  . multivitamin with minerals  1 tablet Oral Daily  . pantoprazole  40 mg Oral Daily  . psyllium  1 packet Oral Daily  . QUEtiapine  25 mg Oral BID  . sodium chloride flush  10-40 mL Intracatheter Q12H  . thiamine  100 mg Oral Daily   Continuous Infusions: . vancomycin 1,000 mg (08/18/19 0230)     LOS: 36 days    Time spent: 35 minutes    Irine Seal, MD [facility]  If 7PM-7AM, please contact night-coverage [email or web address] 08/18/2019, 1:17 PM

## 2019-08-19 DIAGNOSIS — E871 Hypo-osmolality and hyponatremia: Secondary | ICD-10-CM

## 2019-08-19 LAB — GLUCOSE, CAPILLARY
Glucose-Capillary: 123 mg/dL — ABNORMAL HIGH (ref 70–99)
Glucose-Capillary: 134 mg/dL — ABNORMAL HIGH (ref 70–99)
Glucose-Capillary: 115 mg/dL — ABNORMAL HIGH (ref 70–99)
Glucose-Capillary: 139 mg/dL — ABNORMAL HIGH (ref 70–99)

## 2019-08-19 LAB — BASIC METABOLIC PANEL
Anion gap: 9 (ref 5–15)
BUN: 22 mg/dL — ABNORMAL HIGH (ref 6–20)
CO2: 28 mmol/L (ref 22–32)
Calcium: 8.8 mg/dL — ABNORMAL LOW (ref 8.9–10.3)
Chloride: 96 mmol/L — ABNORMAL LOW (ref 98–111)
Creatinine, Ser: 0.78 mg/dL (ref 0.61–1.24)
GFR calc Af Amer: >60 mL/min (ref >60)
GFR calc non Af Amer: >60 mL/min (ref >60)
Glucose, Bld: 159 mg/dL — ABNORMAL HIGH (ref 70–99)
Potassium: 4.6 mmol/L (ref 3.5–5.1)
Sodium: 133 mmol/L — ABNORMAL LOW (ref 135–145)

## 2019-08-19 MED ORDER — VANCOMYCIN HCL 750 MG/150ML IV SOLN
750.0000 mg | Freq: Two times a day (BID) | INTRAVENOUS | Status: DC
  Administered 2019-08-20 – 2019-08-21 (×3): 750 mg via INTRAVENOUS
  Filled 2019-08-19 (×6): qty 150

## 2019-08-19 NOTE — Progress Notes (Signed)
OT Cancellation Note  Patient Details Name: Vernon Frye MRN: [id number] DOB: 01/04/1961   Cancelled Treatment:    Reason Eval/Treat Not Completed: Fatigue/lethargy limiting ability to participate.  Attempted skilled OT treatment session.  Pt. States "not right now im too tired". When reviewed likely not able to return today and encouraged to try pt. States "ill just double up tomorrow I cant right now".    Janice Coffin -COTA/L  08/19/2019, 2:40 PM

## 2019-08-19 NOTE — TOC Progression Note (Signed)
Transition of Care [facility]) - Progression Note    Patient Details  Name: GIROLAMO VEKSLER MRN: [id number] Date of Birth: 1960/12/26  [facility]) CM/SW Contact  Jacalyn Lefevre Edson Snowball, RN Phone Number: 08/19/2019, 4:04 PM  Clinical Narrative:      See previous note. Patient from home alone, needing assistance, uninsured. Patient states he will ask friends to assist him at discharge. Will follow up Monday     Barriers to Discharge: Continued Medical Work up  Expected Discharge Plan and Services     Discharge Planning Services: CM Consult   Living arrangements for the past 2 months: Single Family Home                                       Social Determinants of Health (SDOH) Interventions    Readmission Risk Interventions No flowsheet data found.

## 2019-08-19 NOTE — Progress Notes (Signed)
PROGRESS NOTE    Vernon Frye  [id number] DOB: 10/20/1960 DOA: 07/12/2019 PCP: Nicholes Rough, PA-C   Brief Narrative:  59 yo M presenting with left knee swelling. Found to have septic arthritis of the knee. Seen by orthopedics for left total knee arthroplasty. Required intubation. Found to have new onset a fib RVR. Seen by cardiology. Had DCCV. Improving. Transferred to [facility] 08/03/19.  08/16/19:No acute events ON.Continue to monitorsome of his meds leading to drowsiness. IV abx through 09/06/19; need SNF, but no insurance and doesn't seem motivated;per case manager he will need to stay inpatient through 09/06/19 since he does not have supportto go home and do PICC, finally worked with PT for first time on 08/15/19   Assessment & Plan:   Principal Problem:   MRSA bacteremia Active Problems:   Septic joint ([place])   Atrial fibrillation with rapid ventricular response ([place])   AKI (acute kidney injury) ([place])   Hyponatremia   Severe sepsis ([place])   Severe sepsis with acute organ dysfunction (HCC)   Respiratory insufficiency   Acute pain of left knee   Prosthetic joint infection ([place])   Tachypnea   Acute bilateral low back pain without sciatica   Left arm swelling   Red man syndrome   Acute metabolic encephalopathy   S/P revision of total knee   Malnutrition of moderate degree   Acute respiratory failure ([place])  1 new onset paroxysmal atrial fibrillation Patient seen in consultation by cardiology.  Patient underwent DCCV and noted to have an enlarged right ventricle.  Patient currently on amiodarone and metoprolol for rate control.  Eliquis for anticoagulation.  Will need outpatient follow-up with cardiology.  2.  MRSA bacteremia/left knee septic joint, POA Repeat blood cultures from 07/30/2019 -.  TEE done negative for any vegetations.  Orthopedics was called as patient had concerns and wanted to speak with [name] per orthopedics however per prior physician it was felt that Dr.  Alvan Dame feels patient needs to work with pain management.  Continue current dose of IV vancomycin through 09/06/2019.  Patient needs SNF however with no insurance patient will likely stay in-house to complete course of IV antibiotics through 09/06/2019.  Palliative care was called to help with pain management but supposedly unable to at this time.  Continue current pain regimen.  Follow.  3.  Hypertension Blood pressure somewhat borderline.  Patient auto diuresing.  Continue Toprol-XL.  Will discontinue lisinopril.  Follow.   4.  Acute metabolic encephalopathy Improved.  Patient currently on home regimen of Xanax 1 mg nightly.  Continue current pain regimen of MS Contin 15 mg twice daily and oxycodone 4 mg as needed breakthrough pain.  Continue Seroquel.  Will likely keep pain regimen as is.  Follow.  5.  Polysubstance abuse/history of alcohol abuse Patient currently with no symptoms of withdrawal.  Stable.  Supportive care.  6.  Lower extremity edema Lower extremity Dopplers negative for DVT.  Patient auto diuresing with a urine output of 2.4 L over the past 24 hours.  Patient is -37 L during this hospitalization.  Follow.   DVT prophylaxis: Eliquis Code Status: Full Family Communication: Updated patient.  No family at bedside. Disposition Plan: SNF.  Likely remain in-house until IV antibiotics are completed on 09/06/2019   Consultants:   Orthopedics: [name] 07/13/2019  Cardiology: Dr. Daneen Schick 07/13/2019  Procedures:   CT head 07/24/2019  CT left knee 07/23/2019  PICC line placement  Renal ultrasound 07/13/2019  MRI L-spine 07/18/2019  2D  echo 07/16/2019  TEE 08/02/2019  Left lower extremity Doppler 07/29/2019, 07/13/2019  Upper extremity Dopplers 07/29/2019, 07/21/2019  Left knee aspiration at bedside 07/13/2019 per [name]  Irrigation and debridement left total knee with polyexchange per [name] 07/13/2019  Antimicrobials:   IV vancomycin 07/25/2019>>>>>  09/06/2019   Subjective: Patient asleep.  Stated he did not sleep too well last night.  Denies any chest pain.  No shortness of breath.  No abdominal pain.    Objective: Vitals:   08/18/19 0500 08/18/19 1342 08/18/19 2140 08/19/19 0524  BP:  100/67 106/68 103/70  Pulse:  80 75 84  Resp:  20 18 18   Temp:  98.2 F (36.8 C) 98.2 F (36.8 C) 98.6 F (37 C)  TempSrc:  Oral Oral Oral  SpO2:  96% 96% 98%  Weight: 103.9 kg   105.3 kg  Height:        Intake/Output Summary (Last 24 hours) at 08/19/2019 1244 Last data filed at 08/19/2019 1005 Gross per 24 hour  Intake 946.47 ml  Output 2800 ml  Net -1853.53 ml   Filed Weights   08/16/19 0500 08/18/19 0500 08/19/19 0524  Weight: 104 kg 103.9 kg 105.3 kg    Examination:  General exam: NAD  Respiratory system: Lungs clear to auscultation bilaterally anterior lung fields.  No wheezes, no crackles, no rhonchi.  Normal respiratory effort.  Cardiovascular system: RRR no murmurs rubs or gallops.  No JVD.  1-2+ left lower extremity edema.  Gastrointestinal system: Abdomen is nontender, nondistended, soft, positive bowel sounds.  No rebound.  No guarding.  Central nervous system: Alert and oriented. No focal neurological deficits. Extremities: Left lower extremity in knee immobilizer.  1-2+ left lower extremity edema.  Skin: No rashes, lesions or ulcers Psychiatry: Judgement and insight appear normal. Mood & affect appropriate.     Data Reviewed: I have personally reviewed following labs and imaging studies  CBC: Recent Labs  Lab 08/13/19 0413 08/14/19 0840 08/17/19 0356 08/18/19 0420  WBC 11.1* 10.2 8.1 6.7  HGB 9.2* 9.6* 9.0* 8.7*  HCT 30.1* 31.4* 29.7* 28.4*  MCV 92.0 92.1 94.0 92.2  PLT 319 319 322 [id number]   Basic Metabolic Panel: Recent Labs  Lab 08/13/19 0413 08/14/19 0840 08/17/19 0356 08/18/19 0420 08/19/19 0500  NA 134* 135 134* 136 133*  K 4.4 4.5 4.0 4.8 4.6  CL 97* 101 97* 98 96*  CO2 27 25 28 29 28   GLUCOSE  163* 172* 163* 128* 159*  BUN 19 18 24* 22* 22*  CREATININE 0.68 0.70 0.79 0.86 0.78  CALCIUM 9.0 9.2 8.8* 8.9 8.8*   GFR: Estimated Creatinine Clearance: 132.1 mL/min (by C-G formula based on SCr of 0.78 mg/dL). Liver Function Tests: No results for input(s): AST, ALT, ALKPHOS, BILITOT, PROT, ALBUMIN in the last 168 hours. No results for input(s): LIPASE, AMYLASE in the last 168 hours. No results for input(s): AMMONIA in the last 168 hours. Coagulation Profile: No results for input(s): INR, PROTIME in the last 168 hours. Cardiac Enzymes: No results for input(s): CKTOTAL, CKMB, CKMBINDEX, TROPONINI in the last 168 hours. BNP (last 3 results) No results for input(s): PROBNP in the last 8760 hours. HbA1C: No results for input(s): HGBA1C in the last 72 hours. CBG: Recent Labs  Lab 08/18/19 1207 08/18/19 1651 08/18/19 2137 08/19/19 0732 08/19/19 1150  GLUCAP 132* 137* 116* 123* 134*   Lipid Profile: No results for input(s): CHOL, HDL, LDLCALC, TRIG, CHOLHDL, LDLDIRECT in the last 72 hours. Thyroid Function Tests:  No results for input(s): TSH, T4TOTAL, FREET4, T3FREE, THYROIDAB in the last 72 hours. Anemia Panel: No results for input(s): VITAMINB12, FOLATE, FERRITIN, TIBC, IRON, RETICCTPCT in the last 72 hours. Sepsis Labs: No results for input(s): PROCALCITON, LATICACIDVEN in the last 168 hours.  No results found for this or any previous visit (from the past 240 hour(s)).       Radiology Studies: No results found.      Scheduled Meds: . acetaminophen  650 mg Oral Q6H  . amiodarone  200 mg Oral Daily  . apixaban  5 mg Oral BID  . chlorhexidine  15 mL Mouth Rinse BID  . Chlorhexidine Gluconate Cloth  6 each Topical Daily  . docusate sodium  100 mg Oral BID  . feeding supplement (ENSURE ENLIVE)  237 mL Oral BID BM  . folic acid  1 mg Oral Daily  . gabapentin  300 mg Oral TID  . insulin aspart  0-6 Units Subcutaneous TID AC & HS  . lisinopril  10 mg Oral Daily  .  magnesium oxide  400 mg Oral BID  . mouth rinse  15 mL Mouth Rinse q12n4p  . metoprolol succinate  200 mg Oral Daily  . morphine  15 mg Oral Q12H  . multivitamin with minerals  1 tablet Oral Daily  . pantoprazole  40 mg Oral Daily  . psyllium  1 packet Oral Daily  . QUEtiapine  25 mg Oral BID  . sodium chloride flush  10-40 mL Intracatheter Q12H  . thiamine  100 mg Oral Daily   Continuous Infusions: . vancomycin       LOS: 37 days    Time spent: 35 minutes    Irine Seal, MD [facility]  If 7PM-7AM, please contact night-coverage [email or web address] 08/19/2019, 12:44 PM

## 2019-08-20 LAB — GLUCOSE, CAPILLARY
Glucose-Capillary: 129 mg/dL — ABNORMAL HIGH (ref 70–99)
Glucose-Capillary: 120 mg/dL — ABNORMAL HIGH (ref 70–99)
Glucose-Capillary: 141 mg/dL — ABNORMAL HIGH (ref 70–99)
Glucose-Capillary: 152 mg/dL — ABNORMAL HIGH (ref 70–99)

## 2019-08-20 LAB — CBC
HCT: 27.9 % — ABNORMAL LOW (ref 39.0–52.0)
Hemoglobin: 8.5 g/dL — ABNORMAL LOW (ref 13.0–17.0)
MCH: 28.5 pg (ref 26.0–34.0)
MCHC: 30.5 g/dL (ref 30.0–36.0)
MCV: 93.6 fL (ref 80.0–100.0)
Platelets: 299 10*3/uL (ref 150–400)
RBC: 2.98 MIL/uL — ABNORMAL LOW (ref 4.22–5.81)
RDW: 15.4 % (ref 11.5–15.5)
WBC: 6.7 10*3/uL (ref 4.0–10.5)
nRBC: 0 % (ref 0.0–0.2)

## 2019-08-20 LAB — BASIC METABOLIC PANEL
Anion gap: 8 (ref 5–15)
BUN: 19 mg/dL (ref 6–20)
CO2: 29 mmol/L (ref 22–32)
Calcium: 8.9 mg/dL (ref 8.9–10.3)
Chloride: 99 mmol/L (ref 98–111)
Creatinine, Ser: 0.77 mg/dL (ref 0.61–1.24)
GFR calc Af Amer: >60 mL/min (ref >60)
GFR calc non Af Amer: >60 mL/min (ref >60)
Glucose, Bld: 126 mg/dL — ABNORMAL HIGH (ref 70–99)
Potassium: 4.8 mmol/L (ref 3.5–5.1)
Sodium: 136 mmol/L (ref 135–145)

## 2019-08-20 LAB — MAGNESIUM: Magnesium: 1.8 mg/dL (ref 1.7–2.4)

## 2019-08-20 MED ORDER — MAGNESIUM SULFATE 2 GM/50ML IV SOLN
2.0000 g | Freq: Once | INTRAVENOUS | Status: AC
  Administered 2019-08-20: 2 g via INTRAVENOUS
  Filled 2019-08-20: qty 50

## 2019-08-20 NOTE — Progress Notes (Signed)
PROGRESS NOTE    Vernon Frye  [id number] DOB: [birth year] DOA: 07/12/2019 PCP: Nicholes Rough, PA-C   Brief Narrative:  59 yo M presenting with left knee swelling. Found to have septic arthritis of the knee. Seen by orthopedics for left total knee arthroplasty. Required intubation. Found to have new onset a fib RVR. Seen by cardiology. Had DCCV. Improving. Transferred to [facility] 08/03/19.  08/16/19:No acute events ON.Continue to monitorsome of his meds leading to drowsiness. IV abx through 09/06/19; need SNF, but no insurance and doesn't seem motivated;per case manager he will need to stay inpatient through 09/06/19 since he does not have supportto go home and do PICC, finally worked with PT for first time on 08/15/19   Assessment & Plan:   Principal Problem:   MRSA bacteremia Active Problems:   Septic joint ([place])   Atrial fibrillation with rapid ventricular response ([place])   AKI (acute kidney injury) ([place])   Hyponatremia   Severe sepsis ([place])   Severe sepsis with acute organ dysfunction (HCC)   Respiratory insufficiency   Acute pain of left knee   Prosthetic joint infection ([place])   Tachypnea   Acute bilateral low back pain without sciatica   Left arm swelling   Red man syndrome   Acute metabolic encephalopathy   S/P revision of total knee   Malnutrition of moderate degree   Acute respiratory failure ([place])  1 new onset paroxysmal atrial fibrillation Patient seen in consultation by cardiology.  Patient underwent DCCV and noted to have an enlarged right ventricle.  Patient currently on amiodarone and metoprolol for rate control.  Eliquis for anticoagulation.  Will need outpatient follow-up with cardiology.  2.  MRSA bacteremia/left knee septic joint, POA Repeat blood cultures from 07/30/2019 -.  TEE done negative for any vegetations.  Orthopedics was called as patient had concerns and wanted to speak with [name] per orthopedics however per prior physician it was felt that Dr.  Alvan Dame feels patient needs to work with pain management.  Continue current dose of IV vancomycin through 09/06/2019.  Patient needs SNF however with no insurance patient will likely stay in-house to complete course of IV antibiotics through 09/06/2019.  Palliative care was called to help with pain management but supposedly unable to at this time.  Continue current pain regimen.  Follow.  3.  Hypertension Blood pressure improved.  Lisinopril discontinued.  Continue Toprol-XL.  4.  Acute metabolic encephalopathy Improved.  Patient currently on home regimen of Xanax 1 mg nightly.  Continue current pain regimen of MS Contin 15 mg twice daily and oxycodone 4 mg as needed breakthrough pain.  Continue Seroquel.  Will likely keep pain regimen as is.  Follow.  5.  Polysubstance abuse/history of alcohol abuse Patient with no signs of alcohol withdrawal.  Supportive care.   6.  Lower extremity edema Lower extremity Dopplers negative for DVT.  Patient auto diuresing with a urine output of 1.1 L over the past 24 hours.  Patient is -34 L during this hospitalization.  Follow.   DVT prophylaxis: Eliquis Code Status: Full Family Communication: Updated patient.  No family at bedside. Disposition Plan: SNF.  Likely remain in-house until IV antibiotics are completed on 09/06/2019   Consultants:   Orthopedics: [name] 07/13/2019  Cardiology: Dr. Daneen Schick 07/13/2019  Procedures:   CT head 07/24/2019  CT left knee 07/23/2019  PICC line placement  Renal ultrasound 07/13/2019  MRI L-spine 07/18/2019  2D echo 07/16/2019  TEE 08/02/2019  Left lower extremity Doppler  07/29/2019, 07/13/2019  Upper extremity Dopplers 07/29/2019, 07/21/2019  Left knee aspiration at bedside 07/13/2019 per [name]  Irrigation and debridement left total knee with polyexchange per [name] 07/13/2019  Antimicrobials:   IV vancomycin 07/25/2019>>>>> 09/06/2019   Subjective: Patient eating [place] ice.  Denies any shortness  of breath or chest pain.    Objective: Vitals:   08/19/19 1430 08/19/19 2141 08/20/19 0556 08/20/19 1438  BP: 117/74 123/88 119/78 113/70  Pulse: 77 76 74 83  Resp: 16 19 19 16   Temp: 98.6 F (37 C) 98.6 F (37 C) 98.4 F (36.9 C) 98.1 F (36.7 C)  TempSrc: Oral Oral Oral Oral  SpO2: 100% 99% 99% 99%  Weight:      Height:        Intake/Output Summary (Last 24 hours) at 08/20/2019 1820 Last data filed at 08/20/2019 1613 Gross per 24 hour  Intake 360 ml  Output 1625 ml  Net -1265 ml   Filed Weights   08/16/19 0500 08/18/19 0500 08/19/19 0524  Weight: 104 kg 103.9 kg 105.3 kg    Examination:  General exam: NAD  Respiratory system: CTAB anterior lung fields.  No wheezes, no crackles, no rhonchi.  Normal respiratory effort.   Cardiovascular system: Regular rate rhythm no murmurs rubs or gallops.  No JVD.  1-2+ left lower extremity edema Gastrointestinal system: Abdomen is soft, nontender, nondistended, positive bowel sounds.  No rebound.  No guarding.   Central nervous system: Alert and oriented. No focal neurological deficits. Extremities: Left lower extremity in knee immobilizer.  1-2+ left lower extremity edema.  Skin: No rashes, lesions or ulcers Psychiatry: Judgement and insight appear normal. Mood & affect appropriate.     Data Reviewed: I have personally reviewed following labs and imaging studies  CBC: Recent Labs  Lab 08/14/19 0840 08/17/19 0356 08/18/19 0420 08/20/19 0355  WBC 10.2 8.1 6.7 6.7  HGB 9.6* 9.0* 8.7* 8.5*  HCT 31.4* 29.7* 28.4* 27.9*  MCV 92.1 94.0 92.2 93.6  PLT 319 322 291 [id number]   Basic Metabolic Panel: Recent Labs  Lab 08/14/19 0840 08/17/19 0356 08/18/19 0420 08/19/19 0500 08/20/19 0355  NA 135 134* 136 133* 136  K 4.5 4.0 4.8 4.6 4.8  CL 101 97* 98 96* 99  CO2 25 28 29 28 29   GLUCOSE 172* 163* 128* 159* 126*  BUN 18 24* 22* 22* 19  CREATININE 0.70 0.79 0.86 0.78 0.77  CALCIUM 9.2 8.8* 8.9 8.8* 8.9  MG  --   --   --   --   1.8   GFR: Estimated Creatinine Clearance: 132.1 mL/min (by C-G formula based on SCr of 0.77 mg/dL). Liver Function Tests: No results for input(s): AST, ALT, ALKPHOS, BILITOT, PROT, ALBUMIN in the last 168 hours. No results for input(s): LIPASE, AMYLASE in the last 168 hours. No results for input(s): AMMONIA in the last 168 hours. Coagulation Profile: No results for input(s): INR, PROTIME in the last 168 hours. Cardiac Enzymes: No results for input(s): CKTOTAL, CKMB, CKMBINDEX, TROPONINI in the last 168 hours. BNP (last 3 results) No results for input(s): PROBNP in the last 8760 hours. HbA1C: No results for input(s): HGBA1C in the last 72 hours. CBG: Recent Labs  Lab 08/19/19 1638 08/19/19 2140 08/20/19 0736 08/20/19 1153 08/20/19 1642  GLUCAP 115* 139* 129* 120* 141*   Lipid Profile: No results for input(s): CHOL, HDL, LDLCALC, TRIG, CHOLHDL, LDLDIRECT in the last 72 hours. Thyroid Function Tests: No results for input(s): TSH, T4TOTAL, FREET4, T3FREE,  THYROIDAB in the last 72 hours. Anemia Panel: No results for input(s): VITAMINB12, FOLATE, FERRITIN, TIBC, IRON, RETICCTPCT in the last 72 hours. Sepsis Labs: No results for input(s): PROCALCITON, LATICACIDVEN in the last 168 hours.  No results found for this or any previous visit (from the past 240 hour(s)).       Radiology Studies: No results found.      Scheduled Meds: . acetaminophen  650 mg Oral Q6H  . amiodarone  200 mg Oral Daily  . apixaban  5 mg Oral BID  . chlorhexidine  15 mL Mouth Rinse BID  . Chlorhexidine Gluconate Cloth  6 each Topical Daily  . docusate sodium  100 mg Oral BID  . feeding supplement (ENSURE ENLIVE)  237 mL Oral BID BM  . folic acid  1 mg Oral Daily  . gabapentin  300 mg Oral TID  . insulin aspart  0-6 Units Subcutaneous TID AC & HS  . magnesium oxide  400 mg Oral BID  . mouth rinse  15 mL Mouth Rinse q12n4p  . metoprolol succinate  200 mg Oral Daily  . morphine  15 mg Oral  Q12H  . multivitamin with minerals  1 tablet Oral Daily  . pantoprazole  40 mg Oral Daily  . psyllium  1 packet Oral Daily  . QUEtiapine  25 mg Oral BID  . sodium chloride flush  10-40 mL Intracatheter Q12H  . thiamine  100 mg Oral Daily   Continuous Infusions: . vancomycin 750 mg (08/20/19 1726)     LOS: 38 days    Time spent: 35 minutes    Irine Seal, MD [facility]  If 7PM-7AM, please contact night-coverage [email or web address] 08/20/2019, 6:20 PM

## 2019-08-21 LAB — GLUCOSE, CAPILLARY
Glucose-Capillary: 117 mg/dL — ABNORMAL HIGH (ref 70–99)
Glucose-Capillary: 138 mg/dL — ABNORMAL HIGH (ref 70–99)
Glucose-Capillary: 148 mg/dL — ABNORMAL HIGH (ref 70–99)
Glucose-Capillary: 153 mg/dL — ABNORMAL HIGH (ref 70–99)

## 2019-08-21 LAB — VANCOMYCIN, TROUGH: Vancomycin Tr: 12 ug/mL — ABNORMAL LOW (ref 15–20)

## 2019-08-21 LAB — VANCOMYCIN, PEAK: Vancomycin Pk: 23 ug/mL — ABNORMAL LOW (ref 30–40)

## 2019-08-21 LAB — BASIC METABOLIC PANEL
Anion gap: 8 (ref 5–15)
BUN: 18 mg/dL (ref 6–20)
CO2: 29 mmol/L (ref 22–32)
Calcium: 8.8 mg/dL — ABNORMAL LOW (ref 8.9–10.3)
Chloride: 102 mmol/L (ref 98–111)
Creatinine, Ser: 0.79 mg/dL (ref 0.61–1.24)
GFR calc Af Amer: >60 mL/min (ref >60)
GFR calc non Af Amer: >60 mL/min (ref >60)
Glucose, Bld: 143 mg/dL — ABNORMAL HIGH (ref 70–99)
Potassium: 4.4 mmol/L (ref 3.5–5.1)
Sodium: 139 mmol/L (ref 135–145)

## 2019-08-21 LAB — SEDIMENTATION RATE: Sed Rate: 76 mm/hr — ABNORMAL HIGH (ref 0–16)

## 2019-08-21 LAB — C-REACTIVE PROTEIN: CRP: 4.9 mg/dL — ABNORMAL HIGH (ref <1.0)

## 2019-08-21 MED ORDER — VANCOMYCIN HCL IN DEXTROSE 1-5 GM/200ML-% IV SOLN
1000.0000 mg | Freq: Two times a day (BID) | INTRAVENOUS | Status: DC
  Administered 2019-08-21 – 2019-08-27 (×12): 1000 mg via INTRAVENOUS
  Filled 2019-08-21 (×12): qty 200

## 2019-08-21 NOTE — Progress Notes (Signed)
Patient ID: Vernon Frye, male   DOB: May 25, 1961, 59 y.o.   MRN: [id number] Subjective: POD # 26 status post resection of septic left total knee    Patient reports pain as mild and controlled when leg in neutral position but reports a lot of pain with lateral movements. Eating well with normal BMs  Objective:   VITALS:   Vitals:   08/20/19 2150 08/21/19 0544  BP: 126/73 110/78  Pulse: 80 71  Resp: 18 18  Temp: 98.8 F (37.1 C) 97.9 F (36.6 C)  SpO2: 98% 98%    Exam: LLE and RLE edema Intact sensibility distally Limited active DF LLE Left knee incision basically healed Distal incision with small eschar with limited serous drainage on dressing  LABS Recent Labs    08/20/19 0355  HGB 8.5*  HCT 27.9*  WBC 6.7  PLT 299    Recent Labs    08/19/19 0500 08/20/19 0355 08/21/19 0401  NA 133* 136 139  K 4.6 4.8 4.4  BUN 22* 19 18  CREATININE 0.78 0.77 0.79  GLUCOSE 159* 126* 143*    No results for input(s): LABPT, INR in the last 72 hours.   Assessment/Plan: POD # 26 status post resection of septic left total knee   Plan: Continue current IV antibiotic course to try and treat MRSA infection on left knee  I will order an interval CRP and ESR to check response AFO to left LE to prevent equinus contracture  Knee immobilizer to LLE with attempts at OOB activity Will follow Total of 6 weeks IV antibiotics

## 2019-08-21 NOTE — Plan of Care (Signed)

## 2019-08-21 NOTE — Progress Notes (Signed)
[facility] Progress [facility] Patient Details:  Vernon Frye 1961-05-15 [id number]  Patient ID: Vernon Frye, male   DOB: 04/03/1961, 59 y.o.   MRN: [id number]   Vernon Frye 08/21/2019, 10:24 AMCalled [facility] for ankle foot orthosis.

## 2019-08-21 NOTE — Progress Notes (Signed)
Spoke with Anderson Malta in Ortho regarding getting the pt a orthosis per dr orders.  Anderson Malta said that it has to be obtained from the outside vendor ie [facility] and will be delivered Monday morning.  Pt made aware of the brace that will be coming to him on Monday.

## 2019-08-21 NOTE — Progress Notes (Signed)
Pharmacy Antibiotic Note  Vernon Frye is a 59 y.o. male admitted on 07/12/2019 with MRSA bacteremia and septic knee joint infection.  Pharmacy has been consulted for vancomycin dosing. Pt is on extended infusion time (2 hours) to alleviate Red Man Syndrome; no adverse effects reported since this was begun.   Vanc peak = 23 and vanc trough = 12 for calculated AUC of 406 (lower end of goal AUC 400-550). Inc to 1000 q12h over 120 mins for est AUC 541. Since last levels had been taken, SCr has improved from 0.86 to 0.79.  Plan: Increase vancomycin to 1000 mg IV q12h over 120 mins New estimated AUC is 541 Monitor clinical progress, c/s, renal function F/u vancomycin levels at least weekly ID planning vancomycin continuing through 09/06/19 (6t weeks from removal of prosthetic material on 12/29)  Height: 6\' 3"  (190.5 cm) Weight: 238 lb 1.6 oz (108 kg) IBW/kg (Calculated) : 84.5  Temp (24hrs), Avg:98.3 F (36.8 C), Min:97.9 F (36.6 C), Max:98.8 F (37.1 C)  Recent Labs  Lab 08/15/19 1427 08/17/19 0356 08/18/19 0420 08/18/19 0850 08/18/19 1046 08/18/19 1434 08/19/19 0500 08/20/19 0355 08/21/19 0401 08/21/19 0852 08/21/19 1723  WBC  --  8.1 6.7  --   --   --   --  6.7  --   --   --   CREATININE  --  0.79 0.86  --   --   --  0.78 0.77 0.79  --   --   VANCOTROUGH   < >  --   --   --   --  17  --   --   --   --  12*  VANCOPEAK  --   --   --    < > 22*  --   --   --   --  23*  --    < > = values in this interval not displayed.    Estimated Creatinine Clearance: 133.7 mL/min (by C-G formula based on SCr of 0.79 mg/dL).    Allergies  Allergen Reactions  . Vancomycin Shortness Of Breath and Rash    [name]     Antimicrobials this admission: Vanc 12/22>>(09/06/19) Ceftriaxone 12/16 x1 Zosyn 12/16x1  Dapto 12/16>>12/22  Microbiology results: 12/16 BCx 2/2 MRSA 12/16L knee fluid: abundant MRSA 12/16 Ucx: neg 12/16 Grp A strep - neg 12/17 BCx >>1/2 SA 12/18 BCx >>  ngF 1/2 Cdiff >> neg 1/2 UCx >>  >100k Klebsiella (no tx, asymptomatic) 1/2 BCx >> ngF  Thank you for allowing pharmacy to be a part of this patient's care.  Vertis Kelch, PharmD, BCPS PGY2 Cardiology Pharmacy Resident Phone [phone] 08/21/2019       8:14 PM  Please check [email or web address] for unit-specific pharmacist phone numbers

## 2019-08-21 NOTE — Progress Notes (Signed)
PROGRESS NOTE    Vernon Frye  [id number] DOB: 29-Aug-1960 DOA: 07/12/2019 PCP: Nicholes Rough, PA-C   Brief Narrative:  59 yo M presenting with left knee swelling. Found to have septic arthritis of the knee. Seen by orthopedics for left total knee arthroplasty. Required intubation. Found to have new onset a fib RVR. Seen by cardiology. Had DCCV. Improving. Transferred to [facility] 08/03/19.  08/16/19:No acute events ON.Continue to monitorsome of his meds leading to drowsiness. IV abx through 09/06/19; need SNF, but no insurance and doesn't seem motivated;per case manager he will need to stay inpatient through 09/06/19 since he does not have supportto go home and do PICC, finally worked with PT for first time on 08/15/19   Assessment & Plan:   Principal Problem:   MRSA bacteremia Active Problems:   Septic joint ([place])   Atrial fibrillation with rapid ventricular response ([place])   AKI (acute kidney injury) ([place])   Hyponatremia   Severe sepsis ([place])   Severe sepsis with acute organ dysfunction (HCC)   Respiratory insufficiency   Acute pain of left knee   Prosthetic joint infection ([place])   Tachypnea   Acute bilateral low back pain without sciatica   Left arm swelling   Red man syndrome   Acute metabolic encephalopathy   S/P revision of total knee   Malnutrition of moderate degree   Acute respiratory failure ([place])  1 new onset paroxysmal atrial fibrillation Patient seen in consultation by cardiology.  Patient underwent DCCV and noted to have an enlarged right ventricle.  Patient currently on amiodarone and metoprolol for rate control.  Eliquis for anticoagulation.  Will need outpatient follow-up with cardiology.  2.  MRSA bacteremia/left knee septic joint, POA Repeat blood cultures from 07/30/2019 -.  TEE done negative for any vegetations.  Orthopedics was called as patient had concerns and wanted to speak with [name] per orthopedics however per prior physician it was felt that Dr.  Alvan Dame feels patient needs to work with pain management.  Continue current dose of IV vancomycin through 09/06/2019.  Patient needs SNF however with no insurance patient will likely stay in-house to complete course of IV antibiotics through 09/06/2019.  Palliative care was called to help with pain management but supposedly unable to at this time.  Continue current pain regimen.  Follow.  3.  Hypertension Blood pressure was borderline.  Blood pressure improved after discontinuation of lisinopril.  Continue Toprol-XL.  4.  Acute metabolic encephalopathy Clinically improved.  Continue current home regimen of Xanax 1 mg nightly.  Continue current pain regimen of MS Contin 15 mg twice daily and oxycodone 4 mg as needed for breakthrough pain.  Continue Seroquel. Follow.  5.  Polysubstance abuse/history of alcohol abuse Patient with no signs of alcohol withdrawal.  Supportive care.   6.  Lower extremity edema Lower extremity Dopplers negative for DVT.  Patient auto diuresing with a urine output of 2.325 L over the past 24 hours.  Patient is -33.86 L during this hospitalization.  Follow.   DVT prophylaxis: Eliquis Code Status: Full Family Communication: Updated patient.  No family at bedside. Disposition Plan: SNF.  Likely remain in-house until IV antibiotics are completed on 09/06/2019   Consultants:   Orthopedics: [name] 07/13/2019  Cardiology: Dr. Daneen Schick 07/13/2019  Procedures:   CT head 07/24/2019  CT left knee 07/23/2019  PICC line placement  Renal ultrasound 07/13/2019  MRI L-spine 07/18/2019  2D echo 07/16/2019  TEE 08/02/2019  Left lower extremity Doppler 07/29/2019, 07/13/2019  Upper extremity Dopplers 07/29/2019, 07/21/2019  Left knee aspiration at bedside 07/13/2019 per [name]  Irrigation and debridement left total knee with polyexchange per [name] 07/13/2019  Antimicrobials:   IV vancomycin 07/25/2019>>>>> 09/06/2019   Subjective: Patient complaining of pain in  the left knee overnight.  Eating lunch.  Denies any shortness of breath.  No chest pain.  States he saw orthopedics this morning and they were going to order a brace for him.   Objective: Vitals:   08/20/19 0556 08/20/19 1438 08/20/19 2150 08/21/19 0544  BP: 119/78 113/70 126/73 110/78  Pulse: 74 83 80 71  Resp: 19 16 18 18   Temp: 98.4 F (36.9 C) 98.1 F (36.7 C) 98.8 F (37.1 C) 97.9 F (36.6 C)  TempSrc: Oral Oral Oral Oral  SpO2: 99% 99% 98% 98%  Weight:    108 kg  Height:        Intake/Output Summary (Last 24 hours) at 08/21/2019 1212 Last data filed at 08/21/2019 1155 Gross per 24 hour  Intake 1210 ml  Output 3700 ml  Net -2490 ml   Filed Weights   08/18/19 0500 08/19/19 0524 08/21/19 0544  Weight: 103.9 kg 105.3 kg 108 kg    Examination:  General exam: NAD  Respiratory system: Lungs clear to auscultation bilaterally anterior lung fields.  No wheezes, no crackles, no rhonchi.  Normal respiratory effort.   Cardiovascular system: RRR no murmurs rubs or gallops.  No JVD.  2+ left lower extremity edema.  Gastrointestinal system: Abdomen is nontender, nondistended, soft, positive bowel sounds.  No rebound.  No guarding.  Central nervous system: Alert and oriented. No focal neurological deficits. Extremities: Left lower extremity with knee wrapped in ace wrap.  2+ left lower extremity edema.  Skin: No rashes, lesions or ulcers Psychiatry: Judgement and insight appear normal. Mood & affect appropriate.     Data Reviewed: I have personally reviewed following labs and imaging studies  CBC: Recent Labs  Lab 08/17/19 0356 08/18/19 0420 08/20/19 0355  WBC 8.1 6.7 6.7  HGB 9.0* 8.7* 8.5*  HCT 29.7* 28.4* 27.9*  MCV 94.0 92.2 93.6  PLT 322 291 [id number]   Basic Metabolic Panel: Recent Labs  Lab 08/17/19 0356 08/18/19 0420 08/19/19 0500 08/20/19 0355 08/21/19 0401  NA 134* 136 133* 136 139  K 4.0 4.8 4.6 4.8 4.4  CL 97* 98 96* 99 102  CO2 28 29 28 29 29   GLUCOSE  163* 128* 159* 126* 143*  BUN 24* 22* 22* 19 18  CREATININE 0.79 0.86 0.78 0.77 0.79  CALCIUM 8.8* 8.9 8.8* 8.9 8.8*  MG  --   --   --  1.8  --    GFR: Estimated Creatinine Clearance: 133.7 mL/min (by C-G formula based on SCr of 0.79 mg/dL). Liver Function Tests: No results for input(s): AST, ALT, ALKPHOS, BILITOT, PROT, ALBUMIN in the last 168 hours. No results for input(s): LIPASE, AMYLASE in the last 168 hours. No results for input(s): AMMONIA in the last 168 hours. Coagulation Profile: No results for input(s): INR, PROTIME in the last 168 hours. Cardiac Enzymes: No results for input(s): CKTOTAL, CKMB, CKMBINDEX, TROPONINI in the last 168 hours. BNP (last 3 results) No results for input(s): PROBNP in the last 8760 hours. HbA1C: No results for input(s): HGBA1C in the last 72 hours. CBG: Recent Labs  Lab 08/20/19 1153 08/20/19 1642 08/20/19 2148 08/21/19 0743 08/21/19 1156  GLUCAP 120* 141* 152* 117* 138*   Lipid Profile: No results for input(s): CHOL,  HDL, LDLCALC, TRIG, CHOLHDL, LDLDIRECT in the last 72 hours. Thyroid Function Tests: No results for input(s): TSH, T4TOTAL, FREET4, T3FREE, THYROIDAB in the last 72 hours. Anemia Panel: No results for input(s): VITAMINB12, FOLATE, FERRITIN, TIBC, IRON, RETICCTPCT in the last 72 hours. Sepsis Labs: No results for input(s): PROCALCITON, LATICACIDVEN in the last 168 hours.  No results found for this or any previous visit (from the past 240 hour(s)).       Radiology Studies: No results found.      Scheduled Meds: . acetaminophen  650 mg Oral Q6H  . amiodarone  200 mg Oral Daily  . apixaban  5 mg Oral BID  . chlorhexidine  15 mL Mouth Rinse BID  . Chlorhexidine Gluconate Cloth  6 each Topical Daily  . docusate sodium  100 mg Oral BID  . feeding supplement (ENSURE ENLIVE)  237 mL Oral BID BM  . folic acid  1 mg Oral Daily  . gabapentin  300 mg Oral TID  . insulin aspart  0-6 Units Subcutaneous TID AC & HS  .  magnesium oxide  400 mg Oral BID  . mouth rinse  15 mL Mouth Rinse q12n4p  . metoprolol succinate  200 mg Oral Daily  . morphine  15 mg Oral Q12H  . multivitamin with minerals  1 tablet Oral Daily  . pantoprazole  40 mg Oral Daily  . psyllium  1 packet Oral Daily  . QUEtiapine  25 mg Oral BID  . sodium chloride flush  10-40 mL Intracatheter Q12H  . thiamine  100 mg Oral Daily   Continuous Infusions: . vancomycin 750 mg (08/21/19 0620)     LOS: 39 days    Time spent: 35 minutes    Irine Seal, MD [facility]  If 7PM-7AM, please contact night-coverage [email or web address] 08/21/2019, 12:12 PM

## 2019-08-22 LAB — BASIC METABOLIC PANEL
Anion gap: 10 (ref 5–15)
BUN: 18 mg/dL (ref 6–20)
CO2: 29 mmol/L (ref 22–32)
Calcium: 8.9 mg/dL (ref 8.9–10.3)
Chloride: 97 mmol/L — ABNORMAL LOW (ref 98–111)
Creatinine, Ser: 0.71 mg/dL (ref 0.61–1.24)
GFR calc Af Amer: >60 mL/min (ref >60)
GFR calc non Af Amer: >60 mL/min (ref >60)
Glucose, Bld: 128 mg/dL — ABNORMAL HIGH (ref 70–99)
Potassium: 4.7 mmol/L (ref 3.5–5.1)
Sodium: 136 mmol/L (ref 135–145)

## 2019-08-22 LAB — HEMOGLOBIN AND HEMATOCRIT, BLOOD
HCT: 27.6 % — ABNORMAL LOW (ref 39.0–52.0)
Hemoglobin: 8.6 g/dL — ABNORMAL LOW (ref 13.0–17.0)

## 2019-08-22 LAB — GLUCOSE, CAPILLARY
Glucose-Capillary: 107 mg/dL — ABNORMAL HIGH (ref 70–99)
Glucose-Capillary: 161 mg/dL — ABNORMAL HIGH (ref 70–99)
Glucose-Capillary: 112 mg/dL — ABNORMAL HIGH (ref 70–99)
Glucose-Capillary: 134 mg/dL — ABNORMAL HIGH (ref 70–99)

## 2019-08-22 NOTE — Progress Notes (Signed)
PROGRESS NOTE    Vernon Frye  [id number] DOB: 1960/08/21 DOA: 07/12/2019 PCP: Nicholes Rough, PA-C   Brief Narrative:  59 yo M presenting with left knee swelling. Found to have septic arthritis of the knee. Seen by orthopedics for left total knee arthroplasty. Required intubation. Found to have new onset a fib RVR. Seen by cardiology. Had DCCV. Improving. Transferred to [facility] 08/03/19.  08/16/19:No acute events ON.Continue to monitorsome of his meds leading to drowsiness. IV abx through 09/06/19; need SNF, but no insurance and doesn't seem motivated;per case manager he will need to stay inpatient through 09/06/19 since he does not have supportto go home and do PICC, finally worked with PT for first time on 08/15/19   Assessment & Plan:   Principal Problem:   MRSA bacteremia Active Problems:   Septic joint ([place])   Atrial fibrillation with rapid ventricular response ([place])   AKI (acute kidney injury) ([place])   Hyponatremia   Severe sepsis ([place])   Severe sepsis with acute organ dysfunction (HCC)   Respiratory insufficiency   Acute pain of left knee   Prosthetic joint infection ([place])   Tachypnea   Acute bilateral low back pain without sciatica   Left arm swelling   Red man syndrome   Acute metabolic encephalopathy   S/P revision of total knee   Malnutrition of moderate degree   Acute respiratory failure ([place])  1 new onset paroxysmal atrial fibrillation Patient seen in consultation by cardiology.  Patient underwent DCCV and noted to have an enlarged right ventricle.  Patient currently on amiodarone and metoprolol for rate control.  Eliquis for anticoagulation.  Will need outpatient follow-up with cardiology.  2.  MRSA bacteremia/left knee septic joint, POA Repeat blood cultures from 07/30/2019 -.  TEE done negative for any vegetations.  Orthopedics was called as patient had concerns and wanted to speak with [name] per orthopedics however per prior physician it was felt that Dr.  Alvan Dame feels patient needs to work with pain management.  Continue current dose of IV vancomycin through 09/06/2019.  Patient needs SNF however with no insurance patient will likely stay in-house to complete course of IV antibiotics through 09/06/2019.  Palliative care was called to help with pain management but supposedly unable to at this time.  Continue current pain regimen.  Follow.  3.  Hypertension Blood pressure was borderline and has improved with discontinuation of lisinopril.  Continue Toprol-XL.  4.  Acute metabolic encephalopathy Improved clinically.  Continue current home regimen of Xanax 1 mg nightly.  Continue current pain regimen of MS Contin 15 mg twice daily and oxycodone 4 mg as needed for breakthrough pain.  Continue Seroquel. Follow.  5.  Polysubstance abuse/history of alcohol abuse Patient with no signs of alcohol withdrawal.  Supportive care.   6.  Lower extremity edema Lower extremity Dopplers negative for DVT.  Patient auto diuresing with a urine output of 3.4 L over the past 24 hours.  Patient is -34.9 L during this hospitalization.  Follow.   DVT prophylaxis: Eliquis Code Status: Full Family Communication: Updated patient.  No family at bedside. Disposition Plan: SNF.  Likely remain in-house until IV antibiotics are completed on 09/06/2019   Consultants:   Orthopedics: [name] 07/13/2019  Cardiology: Dr. Daneen Schick 07/13/2019  Procedures:   CT head 07/24/2019  CT left knee 07/23/2019  PICC line placement  Renal ultrasound 07/13/2019  MRI L-spine 07/18/2019  2D echo 07/16/2019  TEE 08/02/2019  Left lower extremity Doppler 07/29/2019, 07/13/2019  Upper extremity Dopplers 07/29/2019, 07/21/2019  Left knee aspiration at bedside 07/13/2019 per [name]  Irrigation and debridement left total knee with polyexchange per [name] 07/13/2019  Antimicrobials:   IV vancomycin 07/25/2019>>>>> 09/06/2019   Subjective: Patient laying in bed on the telephone  ordering his dinner.  Denies any chest pain or shortness of breath.   Objective: Vitals:   08/21/19 0544 08/21/19 1430 08/21/19 1945 08/22/19 0454  BP: 110/78 131/85 119/76 105/65  Pulse: 71 96 86 68  Resp: 18 18 18 19   Temp: 97.9 F (36.6 C) 98.2 F (36.8 C) 98.4 F (36.9 C) 97.9 F (36.6 C)  TempSrc: Oral Oral Oral Axillary  SpO2: 98% 99% 98% 97%  Weight: 108 kg     Height:        Intake/Output Summary (Last 24 hours) at 08/22/2019 1313 Last data filed at 08/22/2019 1244 Gross per 24 hour  Intake 1938 ml  Output 2850 ml  Net -912 ml   Filed Weights   08/18/19 0500 08/19/19 0524 08/21/19 0544  Weight: 103.9 kg 105.3 kg 108 kg    Examination:  General exam: NAD  Respiratory system: CTAB anterior lung fields.  No wheezes, no crackles, no rhonchi.  Normal respiratory effort. Cardiovascular system: Regular rate rhythm no murmurs rubs or gallops.  No JVD.  2+ left lower extremity edema.  Gastrointestinal system: Abdomen is soft, nontender, nondistended, positive bowel sounds.  No rebound.  No guarding.  Central nervous system: Alert and oriented. No focal neurological deficits. Extremities: Left lower extremity with knee wrapped in ace wrap and then knee immobilizer.  2+ left lower extremity edema.  Skin: No rashes, lesions or ulcers Psychiatry: Judgement and insight appear normal. Mood & affect appropriate.     Data Reviewed: I have personally reviewed following labs and imaging studies  CBC: Recent Labs  Lab 08/17/19 0356 08/18/19 0420 08/20/19 0355 08/22/19 0330  WBC 8.1 6.7 6.7  --   HGB 9.0* 8.7* 8.5* 8.6*  HCT 29.7* 28.4* 27.9* 27.6*  MCV 94.0 92.2 93.6  --   PLT 322 291 299  --    Basic Metabolic Panel: Recent Labs  Lab 08/18/19 0420 08/19/19 0500 08/20/19 0355 08/21/19 0401 08/22/19 0330  NA 136 133* 136 139 136  K 4.8 4.6 4.8 4.4 4.7  CL 98 96* 99 102 97*  CO2 29 28 29 29 29   GLUCOSE 128* 159* 126* 143* 128*  BUN 22* 22* 19 18 18   CREATININE  0.86 0.78 0.77 0.79 0.71  CALCIUM 8.9 8.8* 8.9 8.8* 8.9  MG  --   --  1.8  --   --    GFR: Estimated Creatinine Clearance: 133.7 mL/min (by C-G formula based on SCr of 0.71 mg/dL). Liver Function Tests: No results for input(s): AST, ALT, ALKPHOS, BILITOT, PROT, ALBUMIN in the last 168 hours. No results for input(s): LIPASE, AMYLASE in the last 168 hours. No results for input(s): AMMONIA in the last 168 hours. Coagulation Profile: No results for input(s): INR, PROTIME in the last 168 hours. Cardiac Enzymes: No results for input(s): CKTOTAL, CKMB, CKMBINDEX, TROPONINI in the last 168 hours. BNP (last 3 results) No results for input(s): PROBNP in the last 8760 hours. HbA1C: No results for input(s): HGBA1C in the last 72 hours. CBG: Recent Labs  Lab 08/21/19 1156 08/21/19 1642 08/21/19 2009 08/22/19 0755 08/22/19 1241  GLUCAP 138* 148* 153* 107* 161*   Lipid Profile: No results for input(s): CHOL, HDL, LDLCALC, TRIG, CHOLHDL, LDLDIRECT in the  last 72 hours. Thyroid Function Tests: No results for input(s): TSH, T4TOTAL, FREET4, T3FREE, THYROIDAB in the last 72 hours. Anemia Panel: No results for input(s): VITAMINB12, FOLATE, FERRITIN, TIBC, IRON, RETICCTPCT in the last 72 hours. Sepsis Labs: No results for input(s): PROCALCITON, LATICACIDVEN in the last 168 hours.  No results found for this or any previous visit (from the past 240 hour(s)).       Radiology Studies: No results found.      Scheduled Meds: . acetaminophen  650 mg Oral Q6H  . amiodarone  200 mg Oral Daily  . apixaban  5 mg Oral BID  . chlorhexidine  15 mL Mouth Rinse BID  . Chlorhexidine Gluconate Cloth  6 each Topical Daily  . docusate sodium  100 mg Oral BID  . feeding supplement (ENSURE ENLIVE)  237 mL Oral BID BM  . folic acid  1 mg Oral Daily  . gabapentin  300 mg Oral TID  . insulin aspart  0-6 Units Subcutaneous TID AC & HS  . magnesium oxide  400 mg Oral BID  . mouth rinse  15 mL Mouth  Rinse q12n4p  . metoprolol succinate  200 mg Oral Daily  . morphine  15 mg Oral Q12H  . multivitamin with minerals  1 tablet Oral Daily  . pantoprazole  40 mg Oral Daily  . psyllium  1 packet Oral Daily  . QUEtiapine  25 mg Oral BID  . sodium chloride flush  10-40 mL Intracatheter Q12H  . thiamine  100 mg Oral Daily   Continuous Infusions: . vancomycin 1,000 mg (08/22/19 0542)     LOS: 40 days    Time spent: 35 minutes    Irine Seal, MD [facility]  If 7PM-7AM, please contact night-coverage [email or web address] 08/22/2019, 1:13 PM

## 2019-08-22 NOTE — Progress Notes (Signed)
Patient called this Probation officer and demanding to call Md. When I asked him what specifically can I tell the Md , patient stated its none of my business, I explained to the patient that we have to be specific since Md are covering multiple patients, patient was also demanding to see Dr. Sharlet Salina, this writer told him that the his attending Md is Dr. Grandville Silos and patient became so loud demanding the charge nurse. Charge RN came in and talked with the patient. Will monitor behavior.

## 2019-08-22 NOTE — Progress Notes (Signed)
Physical Therapy Treatment Patient Details Name: Vernon Frye MRN: [id number] DOB: Jan 11, 1961 Today's Date: 08/22/2019    History of Present Illness Pt is a 59 y/o M PMH Afib, HTN, L TKR (2016) who presented to ED with Lt knee pain. 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 Desaturated with hypoxemic respiratory failure requiring 3LNC. Pt underwent L TKA removal with placement of antibiotic spacers on 12/29, agitated after procedure and unable to be extubated. Extubated 07/31/19.    PT Comments    Pt with AMS, limited awareness of situation with decreased memory. Requested premedication prior to session which RN provided at 0830 however upon arrival to room at 0929 pt asleep with pill cup in hand with pills still in cup and pt instantly fussing and yelling about not getting his meds. Pt stated willingness to participate with room setup for lateral transfers completed and KI donned. Pt then stated he had to use bedpan assisted pt onto bedpan and while waiting a few minutes pt began cursing and yelling because he had turned the urinal (small amount of water remained) on his stomach so he then emptied it on the floor and states that is what he does when he needs to use it. Pt then stated need for bedpan for 20 min. Made pt aware of time constraints and tech assist and pt denied ability to mobilize.  Pt currently limited by cognition and will continue to follow.    Follow Up Recommendations  SNF;Supervision/Assistance - 24 hour     Equipment Recommendations  Wheelchair (measurements PT);Wheelchair cushion (measurements PT);Hospital bed;3in1 (PT)    Recommendations for Other Services       Precautions / Restrictions Precautions Precautions: Fall Required Braces or Orthoses: Knee Immobilizer - Left Knee Immobilizer - Left: Other (comment)(on for all mobility) Restrictions Weight Bearing Restrictions: Yes RUE Weight  Bearing: Weight bearing as tolerated LUE Weight Bearing: Weight bearing as tolerated LLE Weight Bearing: Touchdown weight bearing Other Position/Activity Restrictions: No L knee ROM    Mobility  Bed Mobility Overal bed mobility: Needs Assistance             General bed mobility comments: pt able to use head board to slide toward [facility] with bed in trendelenburg, pt rolled with mod I with use of rail for placement of bed pan  Transfers                    Ambulation/Gait                 Stairs             Wheelchair Mobility    Modified Rankin (Stroke Patients Only)       Balance                                            Cognition Arousal/Alertness: Awake/alert Behavior During Therapy: Agitated Overall Cognitive Status: Impaired/Different from baseline Area of Impairment: Memory;Following commands;Attention;Safety/judgement;Awareness;Problem solving                 Orientation Level: Time Current Attention Level: Sustained Memory: Decreased short-term memory   Safety/Judgement: Decreased awareness of safety;Decreased awareness of deficits   Problem Solving: Slow processing;Decreased initiation;Difficulty sequencing;Requires verbal cues;Requires tactile cues General Comments: pt agitated and angry with not getting his pain meds when  he wants reports feeling like nurses don't help him and mad that he can't see Dr.Crawford. Pt educated by RN that Grandville Silos is his MD today and again by this therapist 2x during session. Pt initially thought this therapist was MD and required correction to discipline. Pt initially stating he wanted to work with therapy and had asked for [place] and then after initiating setup for transfer pt reported need for BM for 15-20 min and not able to work      Exercises      General Comments        Pertinent Vitals/Pain Pain Score: 6  Pain Location: LLE Pain Descriptors / Indicators: Constant Pain  Intervention(s): (pt yelling about not getting pain meds all night but on arrival pt sleeping with medicine cup in hand on his chest holding his pain medicine received 1hr prior)    Home Living                      Prior Function            PT Goals (current goals can now be found in the care plan section) Progress towards PT goals: Not progressing toward goals - comment(pt with AMS and limited participation)    Frequency    Min 3X/week      PT Plan Current plan remains appropriate    Co-evaluation              AM-PAC PT "6 Clicks" Mobility   Outcome Measure  Help needed turning from your back to your side while in a flat bed without using bedrails?: A Little Help needed moving from lying on your back to sitting on the side of a flat bed without using bedrails?: A Lot Help needed moving to and from a bed to a chair (including a wheelchair)?: A Lot Help needed standing up from a chair using your arms (e.g., wheelchair or bedside chair)?: A Lot Help needed to walk in hospital room?: Total Help needed climbing 3-5 steps with a railing? : Total 6 Click Score: 11    End of Session Equipment Utilized During Treatment: Left knee immobilizer Activity Tolerance: Other (comment)(limited by lengthy time requested for BM) Patient left: in bed;with call bell/phone within reach Nurse Communication: Mobility status;Precautions;Weight bearing status PT Visit Diagnosis: Other abnormalities of gait and mobility (R26.89);Pain     Time: [id number] PT Time Calculation (min) (ACUTE ONLY): 19 min  Charges:  $Therapeutic Activity: 8-22 mins                     Mirra Basilio P, PT Acute Rehabilitation Services Pager: [phone] Office: [place] 08/22/2019, 12:59 PM

## 2019-08-22 NOTE — Progress Notes (Signed)
Patient ID: Vernon Frye, male   DOB: 01-28-1961, 59 y.o.   MRN: [id number]  LAB results:  CRP - 31 (07/13/2019) compared to 4.9 (08/21/2019)  ESR - 57 (07/13/2019) compared to 76 (08/21/2019)

## 2019-08-22 NOTE — Plan of Care (Signed)
  Problem: Education: Goal: Knowledge of General Education information will improve Description Including pain rating scale, medication(s)/side effects and non-pharmacologic comfort measures Outcome: Progressing   

## 2019-08-23 LAB — GLUCOSE, CAPILLARY
Glucose-Capillary: 114 mg/dL — ABNORMAL HIGH (ref 70–99)
Glucose-Capillary: 134 mg/dL — ABNORMAL HIGH (ref 70–99)
Glucose-Capillary: 132 mg/dL — ABNORMAL HIGH (ref 70–99)
Glucose-Capillary: 174 mg/dL — ABNORMAL HIGH (ref 70–99)

## 2019-08-23 LAB — BASIC METABOLIC PANEL
Anion gap: 9 (ref 5–15)
BUN: 14 mg/dL (ref 6–20)
CO2: 30 mmol/L (ref 22–32)
Calcium: 9 mg/dL (ref 8.9–10.3)
Chloride: 100 mmol/L (ref 98–111)
Creatinine, Ser: 0.92 mg/dL (ref 0.61–1.24)
GFR calc Af Amer: >60 mL/min (ref >60)
GFR calc non Af Amer: >60 mL/min (ref >60)
Glucose, Bld: 130 mg/dL — ABNORMAL HIGH (ref 70–99)
Potassium: 4.1 mmol/L (ref 3.5–5.1)
Sodium: 139 mmol/L (ref 135–145)

## 2019-08-23 MED ORDER — HYDROMORPHONE HCL 1 MG/ML IJ SOLN
1.0000 mg | Freq: Once | INTRAMUSCULAR | Status: AC
  Administered 2019-08-23: 1 mg via INTRAVENOUS
  Filled 2019-08-23: qty 1

## 2019-08-23 NOTE — Progress Notes (Signed)
PROGRESS NOTE    Vernon Frye  [id number] DOB: February 01, 1961 DOA: 07/12/2019 PCP: Nicholes Rough, PA-C   Brief Narrative:  59 yo M presenting with left knee swelling. Found to have septic arthritis of the knee. Seen by orthopedics for left total knee arthroplasty. Required intubation. Found to have new onset a fib RVR. Seen by cardiology. Had DCCV. Improving. Transferred to [facility] 08/03/19.  08/16/19:No acute events ON.Continue to monitorsome of his meds leading to drowsiness. IV abx through 09/06/19; need SNF, but no insurance and doesn't seem motivated;per case manager he will need to stay inpatient through 09/06/19 since he does not have supportto go home and do PICC, finally worked with PT for first time on 08/15/19   Assessment & Plan:   Principal Problem:   MRSA bacteremia Active Problems:   Septic joint ([place])   Atrial fibrillation with rapid ventricular response ([place])   AKI (acute kidney injury) ([place])   Hyponatremia   Severe sepsis ([place])   Severe sepsis with acute organ dysfunction (HCC)   Respiratory insufficiency   Acute pain of left knee   Prosthetic joint infection ([place])   Tachypnea   Acute bilateral low back pain without sciatica   Left arm swelling   Red man syndrome   Acute metabolic encephalopathy   S/P revision of total knee   Malnutrition of moderate degree   Acute respiratory failure ([place])  1 new onset paroxysmal atrial fibrillation Patient seen in consultation by cardiology.  Patient underwent DCCV and noted to have an enlarged right ventricle.  Patient currently on amiodarone and metoprolol for rate control.  Eliquis for anticoagulation.  Will need outpatient follow-up with cardiology.  2.  MRSA bacteremia/left knee septic joint, POA Repeat blood cultures from 07/30/2019 -.  TEE done negative for any vegetations.  Orthopedics was called as patient had concerns and wanted to speak with [name] per orthopedics however per prior physician it was felt that Dr.  Alvan Dame feels patient needs to work with pain management.  Continue current dose of IV vancomycin through 09/06/2019.  Patient needs SNF however with no insurance patient will likely stay in-house to complete course of IV antibiotics through 09/06/2019.  Palliative care was called to help with pain management but supposedly unable to at this time.  Continue current pain regimen.  Follow.  3.  Hypertension Blood pressure was borderline but improving with discontinuation of lisinopril.  Continue Toprol-XL.   4.  Acute metabolic encephalopathy Improved clinically.  Continue current home regimen of Xanax 1 mg nightly.  Continue current pain regimen of MS Contin 15 mg twice daily and oxycodone 4 mg as needed for breakthrough pain.  Continue Seroquel. Follow.  5.  Polysubstance abuse/history of alcohol abuse Patient with no signs of alcohol withdrawal.  Supportive care.   6.  Lower extremity edema Lower extremity Dopplers negative for DVT.  Patient auto diuresing with a urine output of 3.5 L over the past 24 hours.  Patient is - 32.037 L during this hospitalization.  Follow.   DVT prophylaxis: Eliquis Code Status: Full Family Communication: Updated patient.  No family at bedside. Disposition Plan: SNF.  Likely remain in-house until IV antibiotics are completed on 09/06/2019   Consultants:   Orthopedics: [name] 07/13/2019  Cardiology: Dr. Daneen Schick 07/13/2019  Procedures:   CT head 07/24/2019  CT left knee 07/23/2019  PICC line placement  Renal ultrasound 07/13/2019  MRI L-spine 07/18/2019  2D echo 07/16/2019  TEE 08/02/2019  Left lower extremity Doppler 07/29/2019, 07/13/2019  Upper extremity Dopplers 07/29/2019, 07/21/2019  Left knee aspiration at bedside 07/13/2019 per [name]  Irrigation and debridement left total knee with polyexchange per [name] 07/13/2019  Antimicrobials:   IV vancomycin 07/25/2019>>>>> 09/06/2019   Subjective: Patient laying in bed.  Denies chest pain  or shortness of breath.  Asking when IV antibiotics will be completed.   Objective: Vitals:   08/22/19 1400 08/22/19 2109 08/23/19 0516 08/23/19 0957  BP: 101/68 121/81 106/73 125/74  Pulse: 80 79 80 80  Resp: 18 20 18    Temp: 97.7 F (36.5 C) 98.4 F (36.9 C) 97.9 F (36.6 C)   TempSrc: Oral Oral Oral   SpO2: 98% 100% 100% 100%  Weight:      Height:        Intake/Output Summary (Last 24 hours) at 08/23/2019 1231 Last data filed at 08/23/2019 0521 Gross per 24 hour  Intake 685.57 ml  Output 2400 ml  Net -1714.43 ml   Filed Weights   08/18/19 0500 08/19/19 0524 08/21/19 0544  Weight: 103.9 kg 105.3 kg 108 kg    Examination:  General exam: NAD  Respiratory system: Lungs are to auscultation bilaterally anterior lung fields.  No wheezes, no crackles, no rhonchi.  Normal respiratory effort.  Cardiovascular system: RRR no murmurs rubs or gallops.  No JVD.  2+ left lower extremity edema.   Gastrointestinal system: Abdomen is nontender, nondistended, soft, positive bowel sounds.  No rebound.  No guarding.   Central nervous system: Alert and oriented. No focal neurological deficits. Extremities: Left lower extremity with knee wrapped in ace wrap and then knee immobilizer.  2+ left lower extremity edema.  Skin: No rashes, lesions or ulcers Psychiatry: Judgement and insight appear normal. Mood & affect appropriate.     Data Reviewed: I have personally reviewed following labs and imaging studies  CBC: Recent Labs  Lab 08/17/19 0356 08/18/19 0420 08/20/19 0355 08/22/19 0330  WBC 8.1 6.7 6.7  --   HGB 9.0* 8.7* 8.5* 8.6*  HCT 29.7* 28.4* 27.9* 27.6*  MCV 94.0 92.2 93.6  --   PLT 322 291 299  --    Basic Metabolic Panel: Recent Labs  Lab 08/19/19 0500 08/20/19 0355 08/21/19 0401 08/22/19 0330 08/23/19 0440  NA 133* 136 139 136 139  K 4.6 4.8 4.4 4.7 4.1  CL 96* 99 102 97* 100  CO2 28 29 29 29 30   GLUCOSE 159* 126* 143* 128* 130*  BUN 22* 19 18 18 14   CREATININE  0.78 0.77 0.79 0.71 0.92  CALCIUM 8.8* 8.9 8.8* 8.9 9.0  MG  --  1.8  --   --   --    GFR: Estimated Creatinine Clearance: 116.2 mL/min (by C-G formula based on SCr of 0.92 mg/dL). Liver Function Tests: No results for input(s): AST, ALT, ALKPHOS, BILITOT, PROT, ALBUMIN in the last 168 hours. No results for input(s): LIPASE, AMYLASE in the last 168 hours. No results for input(s): AMMONIA in the last 168 hours. Coagulation Profile: No results for input(s): INR, PROTIME in the last 168 hours. Cardiac Enzymes: No results for input(s): CKTOTAL, CKMB, CKMBINDEX, TROPONINI in the last 168 hours. BNP (last 3 results) No results for input(s): PROBNP in the last 8760 hours. HbA1C: No results for input(s): HGBA1C in the last 72 hours. CBG: Recent Labs  Lab 08/22/19 1241 08/22/19 1707 08/22/19 2105 08/23/19 0753 08/23/19 1131  GLUCAP 161* 112* 134* 114* 134*   Lipid Profile: No results for input(s): CHOL, HDL, LDLCALC, TRIG, CHOLHDL, LDLDIRECT  in the last 72 hours. Thyroid Function Tests: No results for input(s): TSH, T4TOTAL, FREET4, T3FREE, THYROIDAB in the last 72 hours. Anemia Panel: No results for input(s): VITAMINB12, FOLATE, FERRITIN, TIBC, IRON, RETICCTPCT in the last 72 hours. Sepsis Labs: No results for input(s): PROCALCITON, LATICACIDVEN in the last 168 hours.  No results found for this or any previous visit (from the past 240 hour(s)).       Radiology Studies: No results found.      Scheduled Meds: . acetaminophen  650 mg Oral Q6H  . amiodarone  200 mg Oral Daily  . apixaban  5 mg Oral BID  . chlorhexidine  15 mL Mouth Rinse BID  . Chlorhexidine Gluconate Cloth  6 each Topical Daily  . docusate sodium  100 mg Oral BID  . feeding supplement (ENSURE ENLIVE)  237 mL Oral BID BM  . folic acid  1 mg Oral Daily  . gabapentin  300 mg Oral TID  . insulin aspart  0-6 Units Subcutaneous TID AC & HS  . magnesium oxide  400 mg Oral BID  . mouth rinse  15 mL Mouth  Rinse q12n4p  . metoprolol succinate  200 mg Oral Daily  . morphine  15 mg Oral Q12H  . multivitamin with minerals  1 tablet Oral Daily  . pantoprazole  40 mg Oral Daily  . psyllium  1 packet Oral Daily  . QUEtiapine  25 mg Oral BID  . sodium chloride flush  10-40 mL Intracatheter Q12H  . thiamine  100 mg Oral Daily   Continuous Infusions: . vancomycin 1,000 mg (08/23/19 0521)     LOS: 41 days    Time spent: 35 minutes    Irine Seal, MD [facility]  If 7PM-7AM, please contact night-coverage [email or web address] 08/23/2019, 12:31 PM

## 2019-08-23 NOTE — Plan of Care (Signed)
  Problem: Education: Goal: Knowledge of General Education information will improve Description Including pain rating scale, medication(s)/side effects and non-pharmacologic comfort measures Outcome: Progressing   

## 2019-08-23 NOTE — Progress Notes (Signed)
Physical Therapy Treatment Patient Details Name: Vernon Frye MRN: [id number] DOB: September 15, 1960 Today's Date: 08/23/2019    History of Present Illness Pt is a 59 y/o M PMH Afib, HTN, L TKR (2016) who presented to ED with Lt knee pain. 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 Desaturated with hypoxemic respiratory failure requiring 3LNC. Pt underwent L TKA removal with placement of antibiotic spacers on 12/29, agitated after procedure and unable to be extubated. Extubated 07/31/19.    PT Comments    Pt educated for session time 30 min prior to arrival and on arrival then stating "well I don't know if this will be a good time". Pt ultimately agreeable to therapy with pt stating he has no willingness to sit in chair. Requested pt perform transfers to/from chair to prepare for [facility] mobility in order to increase mobility and function. Pt agreeable to this plan then could not recall plan midway through transfers and with return to bed from chair pt halfway completing lateral movement then just lying on bed where he could have fallen off had assist not been present. Pt states he wants to go home and lacks motivation to do much else and that he is frustrated with lack of pain management due to baseline high tolerance. Pt educated for heel cord stretch with use of belt , pt also complaining about this but then able to demonstrate and report comfort.   Will plan to attempt [facility] mobility next session to allow pt to see benefit of mobility and provide opportunity for increased self care. Pt remains limited by cognition and lack of awareness for safety and function.     Follow Up Recommendations  SNF;Supervision/Assistance - 24 hour     Equipment Recommendations  Wheelchair (measurements PT);Wheelchair cushion (measurements PT);Hospital bed;3in1 (PT)    Recommendations for Other Services       Precautions / Restrictions  Precautions Precautions: Fall Required Braces or Orthoses: Knee Immobilizer - Left Knee Immobilizer - Left: Other (comment)(on for all mobility) Restrictions Weight Bearing Restrictions: Yes RUE Weight Bearing: Weight bearing as tolerated LUE Weight Bearing: Weight bearing as tolerated LLE Weight Bearing: Touchdown weight bearing Other Position/Activity Restrictions: No L knee ROM    Mobility  Bed Mobility Overal bed mobility: Needs Assistance Bed Mobility: Supine to Sit;Sit to Supine     Supine to sit: Min assist Sit to supine: Min assist   General bed mobility comments: pt able to transition to long sitting with HOB 30 degrees with increased time and cues. assist to position and move LLE for all adjustments and positioning in bed. PT with ability to slide toward [facility] with cues and use of head board rail  Transfers Overall transfer level: Needs assistance   Transfers: Lateral/Scoot Transfers Sit to Stand: Min assist;+2 safety/equipment        Lateral/Scoot Transfers: Min assist;+2 safety/equipment General transfer comment: cues for sequence, safety and hand placement with assist to support and move LLE throughout transfer to and from chair. Pt agitated during transfers stating edge of lowered armrest "scrapes my ass" but pulling at pad and did not want pad to transfer with him either due to bunching.  Ambulation/Gait                 Stairs             Wheelchair Mobility    Modified Rankin (Stroke Patients Only)       Balance  Overall balance assessment: Needs assistance Sitting-balance support: Bilateral upper extremity supported;Feet supported Sitting balance-Leahy Scale: Fair                                      Cognition Arousal/Alertness: Awake/alert Behavior During Therapy: Agitated Overall Cognitive Status: Impaired/Different from baseline Area of Impairment: Memory;Following  commands;Attention;Safety/judgement;Awareness;Problem solving                   Current Attention Level: Sustained Memory: Decreased short-term memory Following Commands: Follows multi-step commands inconsistently Safety/Judgement: Decreased awareness of safety;Decreased awareness of deficits   Problem Solving: Slow processing;Decreased initiation;Difficulty sequencing;Requires verbal cues;Requires tactile cues General Comments: pt remains fixated on pain medication regimen and admittedly states he has little desire to participate in therapy. Pt initially educated for plan this session and within minutes was unable to recall discussion and reverted back to agitation regarding pain medicine.      Exercises      General Comments        Pertinent Vitals/Pain Pain Score: 7  Pain Location: LLE with movement Pain Descriptors / Indicators: Aching;Guarding Pain Intervention(s): Monitored during session;Repositioned;RN gave pain meds during session    Home Living                      Prior Function            PT Goals (current goals can now be found in the care plan section) Progress towards PT goals: Progressing toward goals    Frequency    Min 3X/week      PT Plan Current plan remains appropriate    Co-evaluation              AM-PAC PT "6 Clicks" Mobility   Outcome Measure  Help needed turning from your back to your side while in a flat bed without using bedrails?: A Little Help needed moving from lying on your back to sitting on the side of a flat bed without using bedrails?: A Little Help needed moving to and from a bed to a chair (including a wheelchair)?: A Little Help needed standing up from a chair using your arms (e.g., wheelchair or bedside chair)?: A Lot Help needed to walk in hospital room?: Total Help needed climbing 3-5 steps with a railing? : Total 6 Click Score: 13    End of Session Equipment Utilized During Treatment: Left knee  immobilizer Activity Tolerance: Patient limited by pain Patient left: in bed;with call bell/phone within reach;with nursing/sitter in room Nurse Communication: Mobility status;Precautions;Weight bearing status PT Visit Diagnosis: Other abnormalities of gait and mobility (R26.89);Pain     Time: 1135-1205 PT Time Calculation (min) (ACUTE ONLY): 30 min  Charges:  $Therapeutic Activity: 23-37 mins                     Maija P, PT Acute Rehabilitation Services Pager: [phone] Office: [place] 08/23/2019, 1:42 PM

## 2019-08-24 LAB — BASIC METABOLIC PANEL
Anion gap: 10 (ref 5–15)
BUN: 14 mg/dL (ref 6–20)
CO2: 30 mmol/L (ref 22–32)
Calcium: 9.2 mg/dL (ref 8.9–10.3)
Chloride: 99 mmol/L (ref 98–111)
Creatinine, Ser: 0.73 mg/dL (ref 0.61–1.24)
GFR calc Af Amer: >60 mL/min (ref >60)
GFR calc non Af Amer: >60 mL/min (ref >60)
Glucose, Bld: 121 mg/dL — ABNORMAL HIGH (ref 70–99)
Potassium: 4.2 mmol/L (ref 3.5–5.1)
Sodium: 139 mmol/L (ref 135–145)

## 2019-08-24 LAB — GLUCOSE, CAPILLARY
Glucose-Capillary: 111 mg/dL — ABNORMAL HIGH (ref 70–99)
Glucose-Capillary: 138 mg/dL — ABNORMAL HIGH (ref 70–99)
Glucose-Capillary: 126 mg/dL — ABNORMAL HIGH (ref 70–99)
Glucose-Capillary: 145 mg/dL — ABNORMAL HIGH (ref 70–99)

## 2019-08-24 MED ORDER — MORPHINE SULFATE (PF) 2 MG/ML IV SOLN: 2.0000 mg | Freq: Two times a day (BID) | INTRAVENOUS | Status: DC | PRN

## 2019-08-24 MED ORDER — IBUPROFEN 400 MG PO TABS: 400.0000 mg | ORAL_TABLET | Freq: Four times a day (QID) | ORAL | Status: DC | PRN

## 2019-08-24 NOTE — Progress Notes (Signed)
PROGRESS NOTE    Vernon Frye  [id number] DOB: [birth year] DOA: 07/12/2019 PCP: Nicholes Rough, PA-C   Brief Narrative: 59 year old who presents with the left knee and swelling.  Found to have septic arthritis of the knee.  Seen by orthopedic for left total knee arthroplasty.  Require intubation.  Found to have new onset A. fib with RVR.  Seen by cardiology.  Had DCCV.  Improved.  Transfer to [facility] on 1 /6/21.  Patient developed some drowsiness related to medication.  He needs to receive IV antibiotics through 09/06/2019.  Need a SNF but patient does not have insurance.   Assessment & Plan:   Principal Problem:   MRSA bacteremia Active Problems:   Septic joint ([place])   Atrial fibrillation with rapid ventricular response (HCC)   AKI (acute kidney injury) ([place])   Hyponatremia   Severe sepsis (HCC)   Severe sepsis with acute organ dysfunction (HCC)   Respiratory insufficiency   Acute pain of left knee   Prosthetic joint infection (HCC)   Tachypnea   Acute bilateral low back pain without sciatica   Left arm swelling   Red man syndrome   Acute metabolic encephalopathy   S/P revision of total knee   Malnutrition of moderate degree   Acute respiratory failure ([place])  1-New onset A. Fib: -Patient was seen in consultation by cardiology.  -Patient underwent DCCV and noted to have an enlarged right ventricle. Patient currently on amiodarone and metoprolol for rate control. Continue with Eliquis.  2-MRSA bacteremia/left knee septic joint Repeated blood cultures from 07/30/2019 - -TEE done negative for any vegetation. -Continue with IV vancomycin through 28/03/2020 -Patient need a skilled nursing facility although he does not have insurance will likely need to complete antibiotics in-house -We will continue with current pain management. -We will change IV morphine to twice daily the days that he works with PT.  3-Hypertension: Continue with Toprol-XL.  4-Acute metabolic  encephalopathy Improved clinically. Continue with home regimen of Xanax 1 mg nightly.  Polysubstance substance use/history of alcohol use Need to follow-up with his pain management physician  Lower Extremity edema: Doppler of lower extremity: Negative for DVT  Anemia: Check anemia panel  Nutrition Problem: Moderate Malnutrition Etiology: social / environmental circumstances    Signs/Symptoms: percent weight loss, mild muscle depletion    Interventions: Tube feeding, Prostat  Estimated body mass index is 29.9 kg/m as calculated from the following:   Height as of this encounter: 6\' 3"  (1.905 m).   Weight as of this encounter: 108.5 kg.   DVT prophylaxis: Eliquis Code Status: Full code Family Communication: Care discussed with patient Disposition Plan: Need to be discharged to skilled nursing facility.  Likely to remain in-house until he finished IV antibiotics completed 09/06/2019 Consultants:   Orthopedics: [name] 07/13/2019  Cardiology: Dr. Daneen Schick 07/13/2019   Procedures:   CT head 07/24/2019  CT left knee 07/23/2019  PICC line placement  Renal ultrasound 07/13/2019  MRI L-spine 07/18/2019  2D echo 07/16/2019  TEE 08/02/2019  Left lower extremity Doppler 07/29/2019, 07/13/2019  Upper extremity Dopplers 07/29/2019, 07/21/2019  Left knee aspiration at bedside 07/13/2019 per [name]  Irrigation and debridement left total knee with polyexchange per [name] 07/13/2019   Antimicrobials:  IV vancomycin 07/25/2019>>>>>> 09/06/2019  Subjective: Patient complaining of pain.  He reports that he is not able to tolerate pain after he works with physical therapy.   Objective: Vitals:   08/23/19 1359 08/23/19 2113 08/24/19 0500 08/24/19 0553  BP: 106/68 [name] Kitchen)  148/81  116/77  Pulse: 77 85  79  Resp: 18 18  18   Temp: 98.4 F (36.9 C) 98.4 F (36.9 C)  98.1 F (36.7 C)  TempSrc: Oral Oral  Oral  SpO2: 98% 98%  100%  Weight:   108.5 kg   Height:         Intake/Output Summary (Last 24 hours) at 08/24/2019 0823 Last data filed at 08/24/2019 0554 Gross per 24 hour  Intake 240 ml  Output 2977 ml  Net -2737 ml   Filed Weights   08/19/19 0524 08/21/19 0544 08/24/19 0500  Weight: 105.3 kg 108 kg 108.5 kg    Examination:  General exam: Appears calm and comfortable  Respiratory system: Clear to auscultation. Respiratory effort normal. Cardiovascular system: S1 & S2 heard, RRR Gastrointestinal system: Abdomen is nondistended, soft and nontender. No organomegaly or masses felt. Normal bowel sounds heard. Central nervous system: Alert and oriented.  Extremities: plus 2 edema  left LE edema, dressing left Knee Skin: No rashes, lesions or ulcers    Data Reviewed: I have personally reviewed following labs and imaging studies  CBC: Recent Labs  Lab 08/18/19 0420 08/20/19 0355 08/22/19 0330  WBC 6.7 6.7  --   HGB 8.7* 8.5* 8.6*  HCT 28.4* 27.9* 27.6*  MCV 92.2 93.6  --   PLT 291 299  --    Basic Metabolic Panel: Recent Labs  Lab 08/20/19 0355 08/21/19 0401 08/22/19 0330 08/23/19 0440 08/24/19 0452  NA 136 139 136 139 139  K 4.8 4.4 4.7 4.1 4.2  CL 99 102 97* 100 99  CO2 29 29 29 30 30   GLUCOSE 126* 143* 128* 130* 121*  BUN 19 18 18 14 14   CREATININE 0.77 0.79 0.71 0.92 0.73  CALCIUM 8.9 8.8* 8.9 9.0 9.2  MG 1.8  --   --   --   --    GFR: Estimated Creatinine Clearance: 134 mL/min (by C-G formula based on SCr of 0.73 mg/dL). Liver Function Tests: No results for input(s): AST, ALT, ALKPHOS, BILITOT, PROT, ALBUMIN in the last 168 hours. No results for input(s): LIPASE, AMYLASE in the last 168 hours. No results for input(s): AMMONIA in the last 168 hours. Coagulation Profile: No results for input(s): INR, PROTIME in the last 168 hours. Cardiac Enzymes: No results for input(s): CKTOTAL, CKMB, CKMBINDEX, TROPONINI in the last 168 hours. BNP (last 3 results) No results for input(s): PROBNP in the last 8760  hours. HbA1C: No results for input(s): HGBA1C in the last 72 hours. CBG: Recent Labs  Lab 08/22/19 2105 08/23/19 0753 08/23/19 1131 08/23/19 1629 08/23/19 2106  GLUCAP 134* 114* 134* 132* 174*   Lipid Profile: No results for input(s): CHOL, HDL, LDLCALC, TRIG, CHOLHDL, LDLDIRECT in the last 72 hours. Thyroid Function Tests: No results for input(s): TSH, T4TOTAL, FREET4, T3FREE, THYROIDAB in the last 72 hours. Anemia Panel: No results for input(s): VITAMINB12, FOLATE, FERRITIN, TIBC, IRON, RETICCTPCT in the last 72 hours. Sepsis Labs: No results for input(s): PROCALCITON, LATICACIDVEN in the last 168 hours.  No results found for this or any previous visit (from the past 240 hour(s)).       Radiology Studies: No results found.      Scheduled Meds: . acetaminophen  650 mg Oral Q6H  . amiodarone  200 mg Oral Daily  . apixaban  5 mg Oral BID  . chlorhexidine  15 mL Mouth Rinse BID  . Chlorhexidine Gluconate Cloth  6 each Topical Daily  . docusate  sodium  100 mg Oral BID  . feeding supplement (ENSURE ENLIVE)  237 mL Oral BID BM  . folic acid  1 mg Oral Daily  . gabapentin  300 mg Oral TID  . insulin aspart  0-6 Units Subcutaneous TID AC & HS  . magnesium oxide  400 mg Oral BID  . mouth rinse  15 mL Mouth Rinse q12n4p  . metoprolol succinate  200 mg Oral Daily  . morphine  15 mg Oral Q12H  . multivitamin with minerals  1 tablet Oral Daily  . pantoprazole  40 mg Oral Daily  . psyllium  1 packet Oral Daily  . QUEtiapine  25 mg Oral BID  . sodium chloride flush  10-40 mL Intracatheter Q12H  . thiamine  100 mg Oral Daily   Continuous Infusions: . vancomycin 1,000 mg (08/24/19 0537)     LOS: 42 days    Time spent: 35 minutes    Lavon Horn A Laylynn Campanella, MD [facility]   If 7PM-7AM, please contact night-coverage  08/24/2019, 8:23 AM

## 2019-08-24 NOTE — Progress Notes (Signed)
Nutrition Follow-up  DOCUMENTATION CODES:   Non-severe (moderate) malnutrition in context of social or environmental circumstances  INTERVENTION:   -ContinueEnsure Enlive po BID, each supplement provides 350 kcal and 20 grams of protein -Continue MVI with minerals daily  NUTRITION DIAGNOSIS:   Moderate Malnutrition related to social / environmental circumstances as evidenced by percent weight loss, mild muscle depletion.  Ongoing  GOAL:   Patient will meet greater than or equal to 90% of their needs  Progressing   MONITOR:   PO intake, Supplement acceptance, Labs, Weight trends, Skin, I & O's  REASON FOR ASSESSMENT:   Ventilator, Consult Enteral/tube feeding initiation and management  ASSESSMENT:   Patient with PMH significant for HTN, L TKR (2016), and Afib. Presents this admission with septic arthritis of L new and new onset AF with RVR.  12/16- s/p I&D total L knee  12/29- s/p total L knee revision/replacement 1/3- extubated 1/5- s/p TEE- no vegetations; successful DCCV  Reviewed I/O's: -2.7 L x 24 hours and -32.7 L 08/10/19  UOP: 3 L x 24 hours  Attempted to speak with pt via phone, however, no answer. RD also stopped by pt room and sleeping soundly at time of visit. Observed breakfast tray, which was 100% consumed. Documented meal completions 100%. Pt is also consuming Ensure supplements.   Pt with increased nutrient needs and would benefit from nutrient dense supplement. One Ensure Enlive supplement provides 350 kcals, 20 grams protein, and 44-45 grams of carbohydrate vs one Glucerna shake supplement, which provides 220 kcals, 10 grams of protein, and 26 grams of carbohydrate. Given pt's hx of DM, RD will continue to monitor PO intake, CBGS, and adjust supplement regimen as appropriate.   Medications reviewed and include folvite, colace, magnesium oxide, and thiamine.   Per MD notes, pt will remain hospitalized until 09/06/19 for completion of IV antibiotics.  He often refuses therapies.   Labs reviewed: CBGS: 132-174 (inpatient orders for glycemic control are 0-6 units insulin aspart TID before meals and bedtime).   Diet Order:   Diet Order            Diet Carb Modified Fluid consistency: Thin; Room service appropriate? Yes with Assist  Diet effective now              EDUCATION NEEDS:   Not appropriate for education at this time  Skin:  Skin Assessment: Skin Integrity Issues: Skin Integrity Issues:: Incisions, Other (Comment) Incisions: L knee/L leg Other: MASD-buttocks, scrotum  Last BM:  08/23/19  Height:   Ht Readings from Last 1 Encounters:  07/13/19 6\' 3"  (1.905 m)    Weight:   Wt Readings from Last 1 Encounters:  08/24/19 108.5 kg    Ideal Body Weight:  89.1 kg  BMI:  Body mass index is 29.9 kg/m.  Estimated Nutritional Needs:   Kcal:  2300-2500 kcal  Protein:  115-130 grams  Fluid:  >/= 2.3 L/day    Brady Plant A. Jimmye Norman, RD, LDN, [place] Registered Dietitian II Certified Diabetes Care and Education Specialist Pager: [phone] After hours Pager: [phone]

## 2019-08-24 NOTE — Progress Notes (Signed)
Pharmacy Antibiotic Note  Vernon Frye is a 59 y.o. male admitted on 07/12/2019 with MRSA bacteremia and septic knee joint infection.  Pharmacy has been consulted for vancomycin dosing. Pt is on extended infusion time (2 hours) to alleviate Red Man Syndrome; no adverse effects reported since this was begun.     Last Vanc levels 1/24, AUC 406, and dose adjusted from 750 mg to 1gm IV q12h for estimated AUC 541.       Creatinine trended up yesterday but back to 0.73 today. Good UOP.  Plan: Continue Vancomycin 1gm IV q12h over 120 mins Estimated AUC is 541 Monitor clinical progress, c/s, renal function F/u vancomycin levels at least weekly. Bmet at least twice a week ID planning vancomycin continuing through 09/06/19 (6 weeks from removal of prosthetic material on 07/26/19)  Height: 6\' 3"  (190.5 cm) Weight: 239 lb 3.2 oz (108.5 kg) IBW/kg (Calculated) : 84.5  Temp (24hrs), Avg:98.3 F (36.8 C), Min:98.1 F (36.7 C), Max:98.4 F (36.9 C)  Recent Labs  Lab 08/18/19 0420 08/18/19 0850 08/18/19 1046 08/18/19 1434 08/19/19 0500 08/20/19 0355 08/21/19 0401 08/21/19 0852 08/21/19 1723 08/22/19 0330 08/23/19 0440 08/24/19 0452  WBC 6.7  --   --   --   --  6.7  --   --   --   --   --   --   CREATININE 0.86  --   --   --    < > 0.77 0.79  --   --  0.71 0.92 0.73  VANCOTROUGH  --   --   --  17  --   --   --   --  12*  --   --   --   VANCOPEAK  --    < > 22*  --   --   --   --  23*  --   --   --   --    < > = values in this interval not displayed.    Estimated Creatinine Clearance: 134 mL/min (by C-G formula based on SCr of 0.73 mg/dL).    Allergies  Allergen Reactions  . Vancomycin Shortness Of Breath and Rash    [name]     Antimicrobials this admission: Vanc 12/22>>(09/06/19) Ceftriaxone 12/16 x1 Zosyn 12/16x1  Dapto 12/16>>12/22  Microbiology results: 12/16 BCx 2/2 MRSA 12/16L knee fluid: abundant MRSA 12/16 Ucx: neg 12/16 Grp A strep - neg 12/17 BCx >>1/2  SA 12/18 BCx >> ngF 1/2 Cdiff >> neg 1/2 UCx >>  >100k Klebsiella (no tx, asymptomatic) 1/2 BCx >> ngF  Thank you for allowing pharmacy to be a part of this patient's care.  Arty Baumgartner, [place] phone: [phone] 08/24/2019 11:57 AM

## 2019-08-24 NOTE — Progress Notes (Signed)
Occupational Therapy Treatment Patient Details Name: Vernon Frye MRN: [id number] DOB: [birth year] Today's Date: 08/24/2019    History of present illness Pt is a 59 y/o M PMH Afib, HTN, L TKR (2016) who presented to ED with Lt knee pain. 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 Desaturated with hypoxemic respiratory failure requiring 3LNC. Pt underwent L TKA removal with placement of antibiotic spacers on 12/29, agitated after procedure and unable to be extubated. Extubated 07/31/19.   OT comments  Pt declines all mobility and reports "why do I need to do all this when I have a second surgery and that iv until February?"  Pt engaged in bed level exercises and log rolling only. Pt fixated on pain management entire session.   Follow Up Recommendations  SNF;Supervision/Assistance - 24 hour    Equipment Recommendations       Recommendations for Other Services      Precautions / Restrictions Precautions Precautions: Fall Precaution Comments: watch HR Required Braces or Orthoses: Knee Immobilizer - Left Knee Immobilizer - Left: Other (comment)(on for all mobility) Restrictions Weight Bearing Restrictions: Yes RUE Weight Bearing: Weight bearing as tolerated LUE Weight Bearing: Weight bearing as tolerated LLE Weight Bearing: Touchdown weight bearing Other Position/Activity Restrictions: No L knee ROM       Mobility Bed Mobility Overal bed mobility: Needs Assistance Bed Mobility: Rolling Rolling: Supervision         General bed mobility comments: able to roll L / pt states it hurts to roll R . educated on using pillow to help with leg alignment  Transfers                 General transfer comment: declines    Balance                                           ADL either performed or assessed with clinical judgement   ADL Overall ADL's : Needs  assistance/impaired Eating/Feeding: Modified independent Eating/Feeding Details (indicate cue type and reason): drinking from cup on beside table                                   General ADL Comments: pt agreeable to bed level only and log rolling     Vision       Perception     Praxis      Cognition Arousal/Alertness: Awake/alert Behavior During Therapy: Agitated Overall Cognitive Status: Impaired/Different from baseline Area of Impairment: Memory;Following commands;Attention;Safety/judgement;Awareness;Problem solving                 Orientation Level: Time Current Attention Level: Sustained Memory: Decreased short-term memory       Problem Solving: Slow processing General Comments: fixated on pain and management . pt fixated on previous session that OT was not apart of using statements such as "when you" and "because of you"  Pt educated thsi is our first meeting and that i am sorry he has a negative experience"        Exercises Other Exercises Other Exercises: educated on ankle pumps , R LE leg raises, quad sets and glute squeezes   Shoulder Instructions       General Comments      Pertinent Vitals/ Pain  Pain Assessment: Faces Faces Pain Scale: Hurts little more Pain Location: LLE with movement Pain Descriptors / Indicators: Aching;Guarding Pain Intervention(s): Monitored during session;Premedicated before session;Repositioned  Home Living                                          Prior Functioning/Environment              Frequency  Min 2X/week        Progress Toward Goals  OT Goals(current goals can now be found in the care plan section)  Progress towards OT goals: Progressing toward goals  Acute Rehab OT Goals Patient Stated Goal: get stronger OT Goal Formulation: With patient Time For Goal Achievement: 08/30/19 Potential to Achieve Goals: Good ADL Goals Pt Will Perform Grooming: with min  assist;standing Pt Will Perform Upper Body Bathing: with set-up;sitting Pt Will Transfer to Toilet: with mod assist;squat pivot transfer;stand pivot transfer;anterior/posterior transfer;bedside commode Pt Will Perform Toileting - Clothing Manipulation and hygiene: with mod assist;sit to/from stand Additional ADL Goal #1: Pt will perform bed mobility with min assist in preparation for ADL.  Plan Discharge plan remains appropriate    Co-evaluation                 AM-PAC OT "6 Clicks" Daily Activity     Outcome Measure   Help from another person eating meals?: A Little Help from another person taking care of personal grooming?: A Little Help from another person toileting, which includes using toliet, bedpan, or urinal?: Total Help from another person bathing (including washing, rinsing, drying)?: A Lot Help from another person to put on and taking off regular upper body clothing?: A Little Help from another person to put on and taking off regular lower body clothing?: Total 6 Click Score: 13    End of Session    OT Visit Diagnosis: Muscle weakness (generalized) (M62.81);Pain;Other symptoms and signs involving cognitive function;Unsteadiness on feet (R26.81) Pain - Right/Left: Left Pain - part of body: Knee;Leg   Activity Tolerance Patient tolerated treatment well   Patient Left in bed;with call bell/phone within reach;with bed alarm set   Nurse Communication Mobility status;Precautions        Time: [id number] OT Time Calculation (min): 20 min  Charges: OT General Charges $OT Visit: 1 Visit OT Treatments $Therapeutic Exercise: 8-22 mins   Brynn, OTR/L  Acute Rehabilitation Services Pager: [phone] Office: [phone] .    Jeri Modena 08/24/2019, 3:31 PM

## 2019-08-25 LAB — FOLATE: Folate: 26.6 ng/mL (ref >5.9)

## 2019-08-25 LAB — URIC ACID: Uric Acid, Serum: 5.2 mg/dL (ref 3.7–8.6)

## 2019-08-25 LAB — VITAMIN B12: Vitamin B-12: 568 pg/mL (ref 180–914)

## 2019-08-25 LAB — RETICULOCYTES
Immature Retic Fract: 29.4 % — ABNORMAL HIGH (ref 2.3–15.9)
RBC.: 3.21 MIL/uL — ABNORMAL LOW (ref 4.22–5.81)
Retic Count, Absolute: 105.9 10*3/uL (ref 19.0–186.0)
Retic Ct Pct: 3.3 % — ABNORMAL HIGH (ref 0.4–3.1)

## 2019-08-25 LAB — GLUCOSE, CAPILLARY
Glucose-Capillary: 108 mg/dL — ABNORMAL HIGH (ref 70–99)
Glucose-Capillary: 156 mg/dL — ABNORMAL HIGH (ref 70–99)
Glucose-Capillary: 125 mg/dL — ABNORMAL HIGH (ref 70–99)
Glucose-Capillary: 146 mg/dL — ABNORMAL HIGH (ref 70–99)

## 2019-08-25 LAB — IRON AND TIBC
Iron: 36 ug/dL — ABNORMAL LOW (ref 45–182)
Saturation Ratios: 12 % — ABNORMAL LOW (ref 17.9–39.5)
TIBC: 293 ug/dL (ref 250–450)
UIBC: 257 ug/dL

## 2019-08-25 LAB — FERRITIN: Ferritin: 219 ng/mL (ref 24–336)

## 2019-08-25 MED ORDER — HYDROMORPHONE HCL 1 MG/ML IJ SOLN
2.0000 mg | Freq: Two times a day (BID) | INTRAMUSCULAR | Status: DC | PRN
  Administered 2019-08-26 (×2): 2 mg via INTRAVENOUS
  Filled 2019-08-25 (×2): qty 2

## 2019-08-25 MED ORDER — FERROUS SULFATE 325 (65 FE) MG PO TABS
325.0000 mg | ORAL_TABLET | Freq: Two times a day (BID) | ORAL | Status: DC
  Administered 2019-08-25 – 2019-09-07 (×26): 325 mg via ORAL
  Filled 2019-08-25 (×26): qty 1

## 2019-08-25 NOTE — Progress Notes (Addendum)
PROGRESS NOTE    Vernon Frye  [id number] DOB: 1960/10/22 DOA: 07/12/2019 PCP: Nicholes Rough, PA-C   Brief Narrative: 59 year old who presents with the left knee and swelling.  Found to have septic arthritis of the knee.  Seen by orthopedic for left total knee arthroplasty.  Require intubation.  Found to have new onset A. fib with RVR.  Seen by cardiology.  Had DCCV.  Improved.  Transfer to [facility] on 1 /6/21.  Patient developed some drowsiness related to medication.  He needs to receive IV antibiotics through 09/06/2019.  Need a SNF but patient does not have insurance.   Assessment & Plan:   Principal Problem:   MRSA bacteremia Active Problems:   Septic joint ([place])   Atrial fibrillation with rapid ventricular response (HCC)   AKI (acute kidney injury) ([place])   Hyponatremia   Severe sepsis (HCC)   Severe sepsis with acute organ dysfunction (HCC)   Respiratory insufficiency   Acute pain of left knee   Prosthetic joint infection (HCC)   Tachypnea   Acute bilateral low back pain without sciatica   Left arm swelling   Red man syndrome   Acute metabolic encephalopathy   S/P revision of total knee   Malnutrition of moderate degree   Acute respiratory failure ([place])  1-New onset A. Fib: -Patient was seen in consultation by cardiology.  -Patient underwent DCCV and noted to have an enlarged right ventricle. -Patient currently on amiodarone and metoprolol for rate control. -Continue with Eliquis.  2-MRSA bacteremia/left knee septic joint Repeated blood cultures from 07/30/2019 - -TEE done negative for any vegetation. -Continue with IV vancomycin through 28/03/2020 -Patient need a skilled nursing facility although he does not have insurance will likely need to complete antibiotics in-house -will change morphine to dilaudid, discussed with nurse patient to received pain medication prior working with PT. If patient improve physically and is not able to require SNF placement could  discussed with ID others options for antibiotics.    3-Hypertension: Continue with Toprol-XL.  4-Acute metabolic encephalopathy Improved clinically. Continue with home regimen of Xanax 1 mg nightly.  Polysubstance substance use/history of alcohol use Need to follow-up with his pain management physician  Lower Extremity edema: Doppler of lower extremity: Negative for DVT  Anemia: Check anemia panel Right knee pain, check uric acid./   Nutrition Problem: Moderate Malnutrition Etiology: social / environmental circumstances    Signs/Symptoms: percent weight loss, mild muscle depletion    Interventions: Tube feeding, Prostat  Estimated body mass index is 29.9 kg/m as calculated from the following:   Height as of this encounter: 6\' 3"  (1.905 m).   Weight as of this encounter: 108.5 kg.   DVT prophylaxis: Eliquis Code Status: Full code Family Communication: Care discussed with patient Disposition Plan:  Need to be discharged to skilled nursing facility.  Likely to remain in-house until he finished IV antibiotics completed 09/06/2019. If patient improve physically and is not able to require SNF placement could discussed with ID others options for antibiotics. Patient encourage to work with PT.   Consultants:   Orthopedics: [name] 07/13/2019  Cardiology: Dr. Daneen Schick 07/13/2019   Procedures:   CT head 07/24/2019  CT left knee 07/23/2019  PICC line placement  Renal ultrasound 07/13/2019  MRI L-spine 07/18/2019  2D echo 07/16/2019  TEE 08/02/2019  Left lower extremity Doppler 07/29/2019, 07/13/2019  Upper extremity Dopplers 07/29/2019, 07/21/2019  Left knee aspiration at bedside 07/13/2019 per [name]  Irrigation and debridement left total knee with  polyexchange per [name] 07/13/2019   Antimicrobials:  IV vancomycin 07/25/2019>>>>>> 09/06/2019  Subjective: He is willing to work more with PT if pain is better controlled.  He is complaining today of  right knee pain.   Objective: Vitals:   08/24/19 0553 08/24/19 1423 08/24/19 2211 08/25/19 0500  BP: 116/77 120/77 122/74 100/68  Pulse: 79 83 78 64  Resp: 18 18 16 14   Temp: 98.1 F (36.7 C) 98.4 F (36.9 C) 98.8 F (37.1 C) 98.4 F (36.9 C)  TempSrc: Oral Oral Oral Oral  SpO2: 100% 100% 97% 95%  Weight:      Height:        Intake/Output Summary (Last 24 hours) at 08/25/2019 1313 Last data filed at 08/25/2019 0547 Gross per 24 hour  Intake 480 ml  Output 1525 ml  Net -1045 ml   Filed Weights   08/19/19 0524 08/21/19 0544 08/24/19 0500  Weight: 105.3 kg 108 kg 108.5 kg    Examination:  General exam: NAD Respiratory system: CTA Cardiovascular system: S 1, S 2 RRR Gastrointestinal system: BS present, soft, nt Central nervous system: Alert and oriented Extremities: plus 2 edema  left LE edema, dressing left Knee     Data Reviewed: I have personally reviewed following labs and imaging studies  CBC: Recent Labs  Lab 08/20/19 0355 08/22/19 0330  WBC 6.7  --   HGB 8.5* 8.6*  HCT 27.9* 27.6*  MCV 93.6  --   PLT 299  --    Basic Metabolic Panel: Recent Labs  Lab 08/20/19 0355 08/21/19 0401 08/22/19 0330 08/23/19 0440 08/24/19 0452  NA 136 139 136 139 139  K 4.8 4.4 4.7 4.1 4.2  CL 99 102 97* 100 99  CO2 29 29 29 30 30   GLUCOSE 126* 143* 128* 130* 121*  BUN 19 18 18 14 14   CREATININE 0.77 0.79 0.71 0.92 0.73  CALCIUM 8.9 8.8* 8.9 9.0 9.2  MG 1.8  --   --   --   --    GFR: Estimated Creatinine Clearance: 134 mL/min (by C-G formula based on SCr of 0.73 mg/dL). Liver Function Tests: No results for input(s): AST, ALT, ALKPHOS, BILITOT, PROT, ALBUMIN in the last 168 hours. No results for input(s): LIPASE, AMYLASE in the last 168 hours. No results for input(s): AMMONIA in the last 168 hours. Coagulation Profile: No results for input(s): INR, PROTIME in the last 168 hours. Cardiac Enzymes: No results for input(s): CKTOTAL, CKMB, CKMBINDEX, TROPONINI in  the last 168 hours. BNP (last 3 results) No results for input(s): PROBNP in the last 8760 hours. HbA1C: No results for input(s): HGBA1C in the last 72 hours. CBG: Recent Labs  Lab 08/24/19 1139 08/24/19 1634 08/24/19 2127 08/25/19 0800 08/25/19 1203  GLUCAP 138* 126* 145* 108* 156*   Lipid Profile: No results for input(s): CHOL, HDL, LDLCALC, TRIG, CHOLHDL, LDLDIRECT in the last 72 hours. Thyroid Function Tests: No results for input(s): TSH, T4TOTAL, FREET4, T3FREE, THYROIDAB in the last 72 hours. Anemia Panel: Recent Labs    08/25/19 0416  VITAMINB12 568  FOLATE 26.6  FERRITIN 219  TIBC 293  IRON 36*  RETICCTPCT 3.3*   Sepsis Labs: No results for input(s): PROCALCITON, LATICACIDVEN in the last 168 hours.  No results found for this or any previous visit (from the past 240 hour(s)).       Radiology Studies: No results found.      Scheduled Meds: . acetaminophen  650 mg Oral Q6H  . amiodarone  200 mg Oral Daily  . apixaban  5 mg Oral BID  . chlorhexidine  15 mL Mouth Rinse BID  . Chlorhexidine Gluconate Cloth  6 each Topical Daily  . docusate sodium  100 mg Oral BID  . feeding supplement (ENSURE ENLIVE)  237 mL Oral BID BM  . ferrous sulfate  325 mg Oral BID WC  . folic acid  1 mg Oral Daily  . gabapentin  300 mg Oral TID  . insulin aspart  0-6 Units Subcutaneous TID AC & HS  . magnesium oxide  400 mg Oral BID  . mouth rinse  15 mL Mouth Rinse q12n4p  . metoprolol succinate  200 mg Oral Daily  . morphine  15 mg Oral Q12H  . multivitamin with minerals  1 tablet Oral Daily  . pantoprazole  40 mg Oral Daily  . psyllium  1 packet Oral Daily  . QUEtiapine  25 mg Oral BID  . sodium chloride flush  10-40 mL Intracatheter Q12H  . thiamine  100 mg Oral Daily   Continuous Infusions: . vancomycin 1,000 mg (08/25/19 0547)     LOS: 43 days    Time spent: 35 minutes    Seldon Barrell A Jaritza Duignan, MD [facility]   If 7PM-7AM, please contact  night-coverage  08/25/2019, 1:13 PM

## 2019-08-26 ENCOUNTER — Inpatient Hospital Stay (HOSPITAL_COMMUNITY): Payer: Self-pay

## 2019-08-26 LAB — GLUCOSE, CAPILLARY
Glucose-Capillary: 100 mg/dL — ABNORMAL HIGH (ref 70–99)
Glucose-Capillary: 133 mg/dL — ABNORMAL HIGH (ref 70–99)
Glucose-Capillary: 136 mg/dL — ABNORMAL HIGH (ref 70–99)
Glucose-Capillary: 155 mg/dL — ABNORMAL HIGH (ref 70–99)

## 2019-08-26 LAB — VANCOMYCIN, PEAK: Vancomycin Pk: 22 ug/mL — ABNORMAL LOW (ref 30–40)

## 2019-08-26 NOTE — Progress Notes (Signed)
Physical Therapy Treatment Patient Details Name: Vernon Frye MRN: [id number] DOB: 1961-05-23 Today's Date: 08/26/2019    History of Present Illness Pt is a 59 y/o M PMH Afib, HTN, L TKR (2016) who presented to ED with Lt knee pain. 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 Desaturated with hypoxemic respiratory failure requiring 3LNC. Pt underwent L TKA removal with placement of antibiotic spacers on 12/29, agitated after procedure and unable to be extubated. Extubated 07/31/19.    PT Comments    Pt remains significantly limited secondary to pain and LE weakness. Pt with new reported pain in R LE today, which he attributes to performing what sounds like HEP bed level exercises on his own the previous day. Pt required mod A for bed mobility and attempted multiple times to stand from EOB but only able to very minimally and briefly clear buttocks from bed. Continue to recommend post-acute rehab at a SNF prior to returning home as pt continues to require quite a lot of assistance. Pt would continue to benefit from skilled physical therapy services at this time while admitted and after d/c to address the below listed limitations in order to improve overall safety and independence with functional mobility.    Follow Up Recommendations  SNF;Supervision/Assistance - 24 hour     Equipment Recommendations  Wheelchair (measurements PT);Wheelchair cushion (measurements PT);Hospital bed;3in1 (PT)    Recommendations for Other Services       Precautions / Restrictions Precautions Precautions: Fall Required Braces or Orthoses: Knee Immobilizer - Left Knee Immobilizer - Left: Other (comment)(on for all mobility) Restrictions Weight Bearing Restrictions: Yes LUE Weight Bearing: Weight bearing as tolerated LLE Weight Bearing: Touchdown weight bearing    Mobility  Bed Mobility Overal bed mobility: Needs Assistance Bed  Mobility: Supine to Sit;Sit to Supine     Supine to sit: Mod assist Sit to supine: Mod assist   General bed mobility comments: pt requiring increased time and effort, assistance needed for R LE management off of and back onto bed  Transfers                 General transfer comment: attempted sit<>stand from EOB multiple times; pt only able to minimally clear buttocks off of bed enough to get clean linens underneath him. Pt able to perform lateral scoot at EOB with min A at L LE  Ambulation/Gait                 Stairs             Wheelchair Mobility    Modified Rankin (Stroke Patients Only)       Balance Overall balance assessment: Needs assistance Sitting-balance support: Feet supported Sitting balance-Leahy Scale: Fair                                      Cognition Arousal/Alertness: Awake/alert Behavior During Therapy: WFL for tasks assessed/performed Overall Cognitive Status: Impaired/Different from baseline Area of Impairment: Memory;Following commands;Safety/judgement;Awareness;Problem solving                     Memory: Decreased short-term memory Following Commands: Follows one step commands with increased time;Follows one step commands consistently;Follows multi-step commands with increased time;Follows multi-step commands inconsistently Safety/Judgement: Decreased awareness of safety;Decreased awareness of deficits Awareness: Emergent Problem Solving: Slow processing  Exercises      General Comments        Pertinent Vitals/Pain Pain Assessment: Faces Faces Pain Scale: Hurts even more Pain Location: bilateral LEs (R>L) Pain Descriptors / Indicators: Aching;Guarding Pain Intervention(s): Monitored during session;Repositioned    Home Living                      Prior Function            PT Goals (current goals can now be found in the care plan section) Acute Rehab PT Goals PT Goal  Formulation: With patient Time For Goal Achievement: 09/01/19 Potential to Achieve Goals: Fair Progress towards PT goals: Progressing toward goals    Frequency    Min 3X/week      PT Plan Current plan remains appropriate    Co-evaluation              AM-PAC PT "6 Clicks" Mobility   Outcome Measure  Help needed turning from your back to your side while in a flat bed without using bedrails?: A Little Help needed moving from lying on your back to sitting on the side of a flat bed without using bedrails?: A Lot Help needed moving to and from a bed to a chair (including a wheelchair)?: A Lot Help needed standing up from a chair using your arms (e.g., wheelchair or bedside chair)?: A Lot Help needed to walk in hospital room?: Total Help needed climbing 3-5 steps with a railing? : Total 6 Click Score: 11    End of Session Equipment Utilized During Treatment: Left knee immobilizer Activity Tolerance: Patient limited by pain Patient left: in bed;with call bell/phone within reach;with nursing/sitter in room Nurse Communication: Mobility status;Precautions;Weight bearing status PT Visit Diagnosis: Other abnormalities of gait and mobility (R26.89);Pain Pain - Right/Left: (bilateral) Pain - part of body: Knee;Leg     Time: 1325-1407 PT Time Calculation (min) (ACUTE ONLY): 42 min  Charges:  $Therapeutic Activity: 38-52 mins                     Anastasio Champion, DPT  Acute Rehabilitation Services Pager [phone] Office [place] 08/26/2019, 3:21 PM

## 2019-08-26 NOTE — Progress Notes (Addendum)
PROGRESS NOTE    Vernon Frye  [id number] DOB: June 29, 1961 DOA: 07/12/2019 PCP: Nicholes Rough, PA-C   Brief Narrative: 59 year old who presents with the left knee and swelling.  Found to have septic arthritis of the knee.  Seen by orthopedic for left total knee arthroplasty.  Require intubation.  Found to have new onset A. fib with RVR.  Seen by cardiology.  Had DCCV.  Improved.  Transfer to [facility] on 1 /6/21.  Patient developed some drowsiness related to medication.  He needs to receive IV antibiotics through 09/06/2019.  Need a SNF but patient does not have insurance.   Assessment & Plan:   Principal Problem:   MRSA bacteremia Active Problems:   Septic joint ([place])   Atrial fibrillation with rapid ventricular response (HCC)   AKI (acute kidney injury) ([place])   Hyponatremia   Severe sepsis (HCC)   Severe sepsis with acute organ dysfunction (HCC)   Respiratory insufficiency   Acute pain of left knee   Prosthetic joint infection (HCC)   Tachypnea   Acute bilateral low back pain without sciatica   Left arm swelling   Red man syndrome   Acute metabolic encephalopathy   S/P revision of total knee   Malnutrition of moderate degree   Acute respiratory failure ([place])  1-New onset A. Fib: -Patient was seen in consultation by cardiology.  -Patient underwent DCCV and noted to have an enlarged right ventricle. -Patient currently on amiodarone and metoprolol for rate control. -Continue with Eliquis.  2-MRSA bacteremia/left knee septic joint Repeated blood cultures from 07/30/2019 - -TEE done negative for any vegetation. -Continue with IV vancomycin through 28/03/2020 -Patient need a skilled nursing facility although he does not have insurance will likely need to complete antibiotics in-house -will change morphine to dilaudid, discussed with nurse patient to received pain medication prior working with PT. If patient improve physically and is not able to require SNF placement could  discussed with ID others options for antibiotics.    3-Hypertension: Continue with Toprol-XL.  4-Acute metabolic encephalopathy Improved clinically. Continue with home regimen of Xanax 1 mg nightly.  Polysubstance substance use/history of alcohol use Need to follow-up with his pain management physician  Lower Extremity edema: Doppler of lower extremity: Negative for DVT  Anemia: Iron deficiency, B 12 Q000111Q, Folic acid 26, iron 36, ferritin 219. Will start iron.  Right knee pain, uric acid normal.  Needs mobilization.  Check x ray   Nutrition Problem: Moderate Malnutrition Etiology: social / environmental circumstances    Signs/Symptoms: percent weight loss, mild muscle depletion    Interventions: Tube feeding, Prostat  Estimated body mass index is 30.09 kg/m as calculated from the following:   Height as of this encounter: 6\' 3"  (1.905 m).   Weight as of this encounter: 109.2 kg.   DVT prophylaxis: Eliquis Code Status: Full code Family Communication: Care discussed with patient Disposition Plan:  -Need to be discharged to skilled nursing facility.  Likely to remain in-house until he finished IV antibiotics completed 09/06/2019. -If patient improve physically and is not able to require SNF placement could discussed with ID others options for antibiotics. Patient encourage to work with PT.   Consultants:   Orthopedics: [name] 07/13/2019  Cardiology: Dr. Daneen Schick 07/13/2019   Procedures:   CT head 07/24/2019  CT left knee 07/23/2019  PICC line placement  Renal ultrasound 07/13/2019  MRI L-spine 07/18/2019  2D echo 07/16/2019  TEE 08/02/2019  Left lower extremity Doppler 07/29/2019, 07/13/2019  Upper extremity Dopplers  07/29/2019, 07/21/2019  Left knee aspiration at bedside 07/13/2019 per [name]  Irrigation and debridement left total knee with polyexchange per [name] 07/13/2019   Antimicrobials:  IV vancomycin 07/25/2019>>>>>> 09/06/2019   Subjective: -complaint of right knee pain.  He is waiting for PT to work with him.   Objective: Vitals:   08/26/19 0500 08/26/19 0618 08/26/19 1019 08/26/19 1512  BP:  132/78 (!) 155/92 128/77  Pulse:  84  87  Resp:  19  18  Temp:  98.6 F (37 C)  98.6 F (37 C)  TempSrc:  Oral  Oral  SpO2:  96%  99%  Weight: 109.2 kg     Height:        Intake/Output Summary (Last 24 hours) at 08/26/2019 1629 Last data filed at 08/26/2019 1538 Gross per 24 hour  Intake 1842 ml  Output 2650 ml  Net -808 ml   Filed Weights   08/21/19 0544 08/24/19 0500 08/26/19 0500  Weight: 108 kg 108.5 kg 109.2 kg    Examination:  General exam: NAD Respiratory system: CTA Cardiovascular system: S 1, S 2 RRR Gastrointestinal system: BS present Central nervous system: alert and oriented,  Extremities: plus 2 edema  left LE edema, dressing left Knee     Data Reviewed: I have personally reviewed following labs and imaging studies  CBC: Recent Labs  Lab 08/20/19 0355 08/22/19 0330  WBC 6.7  --   HGB 8.5* 8.6*  HCT 27.9* 27.6*  MCV 93.6  --   PLT 299  --    Basic Metabolic Panel: Recent Labs  Lab 08/20/19 0355 08/21/19 0401 08/22/19 0330 08/23/19 0440 08/24/19 0452  NA 136 139 136 139 139  K 4.8 4.4 4.7 4.1 4.2  CL 99 102 97* 100 99  CO2 29 29 29 30 30   GLUCOSE 126* 143* 128* 130* 121*  BUN 19 18 18 14 14   CREATININE 0.77 0.79 0.71 0.92 0.73  CALCIUM 8.9 8.8* 8.9 9.0 9.2  MG 1.8  --   --   --   --    GFR: Estimated Creatinine Clearance: 134.4 mL/min (by C-G formula based on SCr of 0.73 mg/dL). Liver Function Tests: No results for input(s): AST, ALT, ALKPHOS, BILITOT, PROT, ALBUMIN in the last 168 hours. No results for input(s): LIPASE, AMYLASE in the last 168 hours. No results for input(s): AMMONIA in the last 168 hours. Coagulation Profile: No results for input(s): INR, PROTIME in the last 168 hours. Cardiac Enzymes: No results for input(s): CKTOTAL, CKMB, CKMBINDEX,  TROPONINI in the last 168 hours. BNP (last 3 results) No results for input(s): PROBNP in the last 8760 hours. HbA1C: No results for input(s): HGBA1C in the last 72 hours. CBG: Recent Labs  Lab 08/25/19 1203 08/25/19 1645 08/25/19 2023 08/26/19 0745 08/26/19 1158  GLUCAP 156* 125* 146* 100* 133*   Lipid Profile: No results for input(s): CHOL, HDL, LDLCALC, TRIG, CHOLHDL, LDLDIRECT in the last 72 hours. Thyroid Function Tests: No results for input(s): TSH, T4TOTAL, FREET4, T3FREE, THYROIDAB in the last 72 hours. Anemia Panel: Recent Labs    08/25/19 0416  VITAMINB12 568  FOLATE 26.6  FERRITIN 219  TIBC 293  IRON 36*  RETICCTPCT 3.3*   Sepsis Labs: No results for input(s): PROCALCITON, LATICACIDVEN in the last 168 hours.  No results found for this or any previous visit (from the past 240 hour(s)).       Radiology Studies: No results found.      Scheduled Meds: . acetaminophen  650 mg Oral Q6H  . amiodarone  200 mg Oral Daily  . apixaban  5 mg Oral BID  . chlorhexidine  15 mL Mouth Rinse BID  . Chlorhexidine Gluconate Cloth  6 each Topical Daily  . docusate sodium  100 mg Oral BID  . feeding supplement (ENSURE ENLIVE)  237 mL Oral BID BM  . ferrous sulfate  325 mg Oral BID WC  . folic acid  1 mg Oral Daily  . gabapentin  300 mg Oral TID  . insulin aspart  0-6 Units Subcutaneous TID AC & HS  . magnesium oxide  400 mg Oral BID  . mouth rinse  15 mL Mouth Rinse q12n4p  . metoprolol succinate  200 mg Oral Daily  . morphine  15 mg Oral Q12H  . multivitamin with minerals  1 tablet Oral Daily  . pantoprazole  40 mg Oral Daily  . psyllium  1 packet Oral Daily  . QUEtiapine  25 mg Oral BID  . sodium chloride flush  10-40 mL Intracatheter Q12H  . thiamine  100 mg Oral Daily   Continuous Infusions: . vancomycin 1,000 mg (08/26/19 0621)     LOS: 44 days    Time spent: 35 minutes    Lendon George A Nandini Bogdanski, MD [facility]   If 7PM-7AM, please  contact night-coverage  08/26/2019, 4:29 PM

## 2019-08-27 LAB — BASIC METABOLIC PANEL
Anion gap: 10 (ref 5–15)
BUN: 14 mg/dL (ref 6–20)
CO2: 31 mmol/L (ref 22–32)
Calcium: 9.3 mg/dL (ref 8.9–10.3)
Chloride: 97 mmol/L — ABNORMAL LOW (ref 98–111)
Creatinine, Ser: 0.79 mg/dL (ref 0.61–1.24)
GFR calc Af Amer: >60 mL/min (ref >60)
GFR calc non Af Amer: >60 mL/min (ref >60)
Glucose, Bld: 153 mg/dL — ABNORMAL HIGH (ref 70–99)
Potassium: 4.2 mmol/L (ref 3.5–5.1)
Sodium: 138 mmol/L (ref 135–145)

## 2019-08-27 LAB — CBC
HCT: 30.5 % — ABNORMAL LOW (ref 39.0–52.0)
Hemoglobin: 9.2 g/dL — ABNORMAL LOW (ref 13.0–17.0)
MCH: 27.5 pg (ref 26.0–34.0)
MCHC: 30.2 g/dL (ref 30.0–36.0)
MCV: 91 fL (ref 80.0–100.0)
Platelets: 326 10*3/uL (ref 150–400)
RBC: 3.35 MIL/uL — ABNORMAL LOW (ref 4.22–5.81)
RDW: 15 % (ref 11.5–15.5)
WBC: 9.1 10*3/uL (ref 4.0–10.5)
nRBC: 0 % (ref 0.0–0.2)

## 2019-08-27 LAB — GLUCOSE, CAPILLARY
Glucose-Capillary: 129 mg/dL — ABNORMAL HIGH (ref 70–99)
Glucose-Capillary: 96 mg/dL (ref 70–99)
Glucose-Capillary: 144 mg/dL — ABNORMAL HIGH (ref 70–99)
Glucose-Capillary: 137 mg/dL — ABNORMAL HIGH (ref 70–99)

## 2019-08-27 LAB — VANCOMYCIN, TROUGH: Vancomycin Tr: 12 ug/mL — ABNORMAL LOW (ref 15–20)

## 2019-08-27 MED ORDER — DICLOFENAC SODIUM 1 % EX GEL
2.0000 g | Freq: Four times a day (QID) | CUTANEOUS | Status: DC
  Administered 2019-08-27 – 2019-09-07 (×40): 2 g via TOPICAL
  Filled 2019-08-27: qty 100

## 2019-08-27 MED ORDER — VANCOMYCIN HCL 1250 MG/250ML IV SOLN
1250.0000 mg | Freq: Two times a day (BID) | INTRAVENOUS | Status: AC
  Administered 2019-08-27 – 2019-09-06 (×20): 1250 mg via INTRAVENOUS
  Filled 2019-08-27 (×24): qty 250

## 2019-08-27 MED ORDER — IBUPROFEN 400 MG PO TABS
400.0000 mg | ORAL_TABLET | Freq: Three times a day (TID) | ORAL | Status: AC
  Administered 2019-08-27 (×3): 400 mg via ORAL
  Filled 2019-08-27 (×3): qty 1

## 2019-08-27 NOTE — Progress Notes (Signed)
Pharmacy Antibiotic Note  Vernon Frye is a 59 y.o. male admitted on 07/12/2019 with MRSA bacteremia and septic knee joint infection.  Pharmacy has been consulted for vancomycin dosing. Pt is on extended infusion time (2 hours) to alleviate Red Man Syndrome; no adverse effects reported since this was begun.   Vancomycin peak 22, and trough 12 with AUC calculator results in AUC 416 (lower end of goal for 400-500) Patient has had improvement in kidney function (scr 0.79) will increase dose today.      Plan: Increase Vancomycin to 1250mg  IV q12h over 120 mins Estimated AUC is 519.8 Monitor clinical progress, c/s, renal function F/u vancomycin levels at least weekly. Bmet at least twice a week ID planning vancomycin continuing through 09/06/19 (6 weeks from removal of prosthetic material on 07/26/19)  Height: 6\' 3"  (190.5 cm) Weight: 240 lb 11.9 oz (109.2 kg) IBW/kg (Calculated) : 84.5  Temp (24hrs), Avg:98.9 F (37.2 C), Min:98.6 F (37 C), Max:99.3 F (37.4 C)  Recent Labs  Lab 08/21/19 0401 08/21/19 0852 08/21/19 1723 08/22/19 0330 08/23/19 0440 08/24/19 0452 08/26/19 2130 08/27/19 0516  WBC  --   --   --   --   --   --   --  9.1  CREATININE 0.79  --   --  0.71 0.92 0.73  --  0.79  VANCOTROUGH  --   --  12*  --   --   --   --  12*  VANCOPEAK  --  23*  --   --   --   --  22*  --     Estimated Creatinine Clearance: 134.4 mL/min (by C-G formula based on SCr of 0.79 mg/dL).    Allergies  Allergen Reactions  . Vancomycin Shortness Of Breath and Rash    [name]     Antimicrobials this admission: Vanc 12/22>>(09/06/19) Ceftriaxone 12/16 x1 Zosyn 12/16x1  Dapto 12/16>>12/22  Microbiology results: 12/16 BCx 2/2 MRSA 12/16L knee fluid: abundant MRSA 12/16 Ucx: neg 12/16 Grp A strep - neg 12/17 BCx >>1/2 SA 12/18 BCx >> ngF 1/2 Cdiff >> neg 1/2 UCx >>  >100k Klebsiella (no tx, asymptomatic) 1/2 BCx >> ngF    Thank you for the interesting consult and for  involving pharmacy in this patient's care.  Tamela Gammon, PharmD, BCPS 08/27/2019 7:36 AM PGY-2 Pharmacy Administration Resident Please check [email or web address] for unit-specific pharmacist phone numbers

## 2019-08-27 NOTE — Progress Notes (Signed)
PROGRESS NOTE    Vernon Frye  [id number] DOB: 1961/06/21 DOA: 07/12/2019 PCP: Nicholes Rough, PA-C   Brief Narrative: 59 year old who presents with the left knee and swelling.  Found to have septic arthritis of the knee.  Seen by orthopedic for left total knee arthroplasty.  Require intubation.  Found to have new onset A. fib with RVR.  Seen by cardiology.  Had DCCV.  Improved.  Transfer to [facility] on 1 /6/21.  Patient developed some drowsiness related to medication.  He needs to receive IV antibiotics through 09/06/2019.  Need a SNF but patient does not have insurance.   Assessment & Plan:   Principal Problem:   MRSA bacteremia Active Problems:   Septic joint ([place])   Atrial fibrillation with rapid ventricular response (HCC)   AKI (acute kidney injury) ([place])   Hyponatremia   Severe sepsis (HCC)   Severe sepsis with acute organ dysfunction (HCC)   Respiratory insufficiency   Acute pain of left knee   Prosthetic joint infection (HCC)   Tachypnea   Acute bilateral low back pain without sciatica   Left arm swelling   Red man syndrome   Acute metabolic encephalopathy   S/P revision of total knee   Malnutrition of moderate degree   Acute respiratory failure ([place])  1-New onset A. Fib: -Patient was seen in consultation by cardiology.  -Patient underwent DCCV and noted to have an enlarged right ventricle. -Patient currently on amiodarone and metoprolol for rate control. -Continue with Eliquis.  2-MRSA bacteremia/left knee septic joint Repeated blood cultures from 07/30/2019 - -TEE done negative for any vegetation. -Continue with IV vancomycin through 28/03/2020 -Patient need a skilled nursing facility although he does not have insurance will likely need to complete antibiotics in-house -will change morphine to dilaudid, discussed with nurse patient to received pain medication prior working with PT. If patient improve physically and is not able to require SNF placement could  discussed with ID others options for antibiotics.    3-Right knee pain Effusion;  Patient decline arthrocentesis.  He wants medical management. Will schedule ibuprofen and voltaren gel.   Hypertension: Continue with Toprol-XL. Acute metabolic encephalopathy Improved clinically. Continue with home regimen of Xanax 1 mg nightly.  Polysubstance substance use/history of alcohol use Need to follow-up with his pain management physician  Lower Extremity edema: Doppler of lower extremity: Negative for DVT  Anemia: Iron deficiency, B 12 Q000111Q, Folic acid 26, iron 36, ferritin 219. Will start iron.  Right knee pain, uric acid normal.  Needs mobilization.  Check x ray   Nutrition Problem: Moderate Malnutrition Etiology: social / environmental circumstances    Signs/Symptoms: percent weight loss, mild muscle depletion    Interventions: Tube feeding, Prostat  Estimated body mass index is 30.09 kg/m as calculated from the following:   Height as of this encounter: 6\' 3"  (1.905 m).   Weight as of this encounter: 109.2 kg.   DVT prophylaxis: Eliquis Code Status: Full code Family Communication: Care discussed with patient Disposition Plan:  -Need to be discharged to skilled nursing facility.  Likely to remain in-house until he finished IV antibiotics completed 09/06/2019. -If patient improve physically and is not able to require SNF placement could discussed with ID others options for antibiotics. Patient encourage to work with PT.   Consultants:   Orthopedics: [name] 07/13/2019  Cardiology: Dr. Daneen Schick 07/13/2019   Procedures:   CT head 07/24/2019  CT left knee 07/23/2019  PICC line placement  Renal ultrasound 07/13/2019  MRI  L-spine 07/18/2019  2D echo 07/16/2019  TEE 08/02/2019  Left lower extremity Doppler 07/29/2019, 07/13/2019  Upper extremity Dopplers 07/29/2019, 07/21/2019  Left knee aspiration at bedside 07/13/2019 per [name]  Irrigation and  debridement left total knee with polyexchange per [name] 07/13/2019   Antimicrobials:  IV vancomycin 07/25/2019>>>>>> 09/06/2019  Subjective: Complaining of left Knee pain.  Refuse arthrocentesis.   Objective: Vitals:   08/26/19 1019 08/26/19 1512 08/26/19 2046 08/27/19 0629  BP: (!) 155/92 128/77 137/82 100/75  Pulse:  87 90 66  Resp:  18 18 18   Temp:  98.6 F (37 C) 99.3 F (37.4 C) 98.8 F (37.1 C)  TempSrc:  Oral Oral Oral  SpO2:  99% 99% 98%  Weight:      Height:        Intake/Output Summary (Last 24 hours) at 08/27/2019 1512 Last data filed at 08/27/2019 0700 Gross per 24 hour  Intake --  Output 2100 ml  Net -2100 ml   Filed Weights   08/21/19 0544 08/24/19 0500 08/26/19 0500  Weight: 108 kg 108.5 kg 109.2 kg    Examination:  General exam: NAD Respiratory system: CTA Cardiovascular system: S 1, S 2 RRR Gastrointestinal system: BS present, soft, nt Central nervous system: alert, and orineted Extremities: plus 2 edema  left LE edema, dressing left Knee     Data Reviewed: I have personally reviewed following labs and imaging studies  CBC: Recent Labs  Lab 08/22/19 0330 08/27/19 0516  WBC  --  9.1  HGB 8.6* 9.2*  HCT 27.6* 30.5*  MCV  --  91.0  PLT  --  [id number]   Basic Metabolic Panel: Recent Labs  Lab 08/21/19 0401 08/22/19 0330 08/23/19 0440 08/24/19 0452 08/27/19 0516  NA 139 136 139 139 138  K 4.4 4.7 4.1 4.2 4.2  CL 102 97* 100 99 97*  CO2 29 29 30 30 31   GLUCOSE 143* 128* 130* 121* 153*  BUN 18 18 14 14 14   CREATININE 0.79 0.71 0.92 0.73 0.79  CALCIUM 8.8* 8.9 9.0 9.2 9.3   GFR: Estimated Creatinine Clearance: 134.4 mL/min (by C-G formula based on SCr of 0.79 mg/dL). Liver Function Tests: No results for input(s): AST, ALT, ALKPHOS, BILITOT, PROT, ALBUMIN in the last 168 hours. No results for input(s): LIPASE, AMYLASE in the last 168 hours. No results for input(s): AMMONIA in the last 168 hours. Coagulation Profile: No results  for input(s): INR, PROTIME in the last 168 hours. Cardiac Enzymes: No results for input(s): CKTOTAL, CKMB, CKMBINDEX, TROPONINI in the last 168 hours. BNP (last 3 results) No results for input(s): PROBNP in the last 8760 hours. HbA1C: No results for input(s): HGBA1C in the last 72 hours. CBG: Recent Labs  Lab 08/26/19 1158 08/26/19 1636 08/26/19 2043 08/27/19 0735 08/27/19 1130  GLUCAP 133* 136* 155* 129* 96   Lipid Profile: No results for input(s): CHOL, HDL, LDLCALC, TRIG, CHOLHDL, LDLDIRECT in the last 72 hours. Thyroid Function Tests: No results for input(s): TSH, T4TOTAL, FREET4, T3FREE, THYROIDAB in the last 72 hours. Anemia Panel: Recent Labs    08/25/19 0416  VITAMINB12 568  FOLATE 26.6  FERRITIN 219  TIBC 293  IRON 36*  RETICCTPCT 3.3*   Sepsis Labs: No results for input(s): PROCALCITON, LATICACIDVEN in the last 168 hours.  No results found for this or any previous visit (from the past 240 hour(s)).       Radiology Studies: DG Knee Complete 4 Views Right  Result Date: 08/26/2019 CLINICAL DATA:  Right knee pain. EXAM: RIGHT KNEE - COMPLETE 4+ VIEW COMPARISON:  None. FINDINGS: No evidence of fracture or dislocation. A large knee joint effusion is seen. Degenerative spurring is seen involving the patella and tibial spines. Mild chondrocalcinosis also seen. No other osseous abnormality identified. IMPRESSION: 1. Large knee joint effusion. 2. Mild degenerative spurring and chondrocalcinosis, suspicious for CPPD arthropathy. Electronically Signed   By: Marlaine Hind M.D.   On: 08/26/2019 19:21        Scheduled Meds: . acetaminophen  650 mg Oral Q6H  . amiodarone  200 mg Oral Daily  . apixaban  5 mg Oral BID  . chlorhexidine  15 mL Mouth Rinse BID  . Chlorhexidine Gluconate Cloth  6 each Topical Daily  . diclofenac Sodium  2 g Topical QID  . docusate sodium  100 mg Oral BID  . feeding supplement (ENSURE ENLIVE)  237 mL Oral BID BM  . ferrous sulfate  325  mg Oral BID WC  . folic acid  1 mg Oral Daily  . gabapentin  300 mg Oral TID  . ibuprofen  400 mg Oral TID  . insulin aspart  0-6 Units Subcutaneous TID AC & HS  . magnesium oxide  400 mg Oral BID  . mouth rinse  15 mL Mouth Rinse q12n4p  . metoprolol succinate  200 mg Oral Daily  . morphine  15 mg Oral Q12H  . multivitamin with minerals  1 tablet Oral Daily  . pantoprazole  40 mg Oral Daily  . psyllium  1 packet Oral Daily  . QUEtiapine  25 mg Oral BID  . sodium chloride flush  10-40 mL Intracatheter Q12H  . thiamine  100 mg Oral Daily   Continuous Infusions: . vancomycin       LOS: 45 days    Time spent: 35 minutes    Elmarie Shiley, MD [facility]   If 7PM-7AM, please contact night-coverage  08/27/2019, 3:12 PM

## 2019-08-28 LAB — BASIC METABOLIC PANEL
Anion gap: 11 (ref 5–15)
BUN: 18 mg/dL (ref 6–20)
CO2: 29 mmol/L (ref 22–32)
Calcium: 9.4 mg/dL (ref 8.9–10.3)
Chloride: 98 mmol/L (ref 98–111)
Creatinine, Ser: 0.75 mg/dL (ref 0.61–1.24)
GFR calc Af Amer: >60 mL/min (ref >60)
GFR calc non Af Amer: >60 mL/min (ref >60)
Glucose, Bld: 168 mg/dL — ABNORMAL HIGH (ref 70–99)
Potassium: 3.9 mmol/L (ref 3.5–5.1)
Sodium: 138 mmol/L (ref 135–145)

## 2019-08-28 LAB — GLUCOSE, CAPILLARY
Glucose-Capillary: 145 mg/dL — ABNORMAL HIGH (ref 70–99)
Glucose-Capillary: 153 mg/dL — ABNORMAL HIGH (ref 70–99)
Glucose-Capillary: 166 mg/dL — ABNORMAL HIGH (ref 70–99)
Glucose-Capillary: 89 mg/dL (ref 70–99)

## 2019-08-28 MED ORDER — HYDROMORPHONE HCL 1 MG/ML IJ SOLN
INTRAMUSCULAR | Status: AC
  Filled 2019-08-28: qty 1

## 2019-08-28 MED ORDER — FUROSEMIDE 10 MG/ML IJ SOLN
40.0000 mg | Freq: Once | INTRAMUSCULAR | Status: AC
  Administered 2019-08-28: 40 mg via INTRAVENOUS
  Filled 2019-08-28: qty 4

## 2019-08-28 MED ORDER — HYDROMORPHONE HCL 1 MG/ML IJ SOLN
1.0000 mg | Freq: Once | INTRAMUSCULAR | Status: AC
  Administered 2019-08-28: 17:00:00 1 mg via INTRAVENOUS

## 2019-08-28 MED ORDER — HYDROMORPHONE HCL 1 MG/ML IJ SOLN
2.0000 mg | Freq: Two times a day (BID) | INTRAMUSCULAR | Status: DC | PRN
  Administered 2019-08-29 – 2019-08-30 (×4): 2 mg via INTRAVENOUS
  Filled 2019-08-28 (×5): qty 2

## 2019-08-28 MED ORDER — IBUPROFEN 400 MG PO TABS
400.0000 mg | ORAL_TABLET | Freq: Two times a day (BID) | ORAL | Status: AC
  Administered 2019-08-28 – 2019-09-01 (×10): 400 mg via ORAL
  Filled 2019-08-28 (×10): qty 1

## 2019-08-28 NOTE — Progress Notes (Signed)
PROGRESS NOTE    Vernon Frye  [id number] DOB: 11-Apr-1961 DOA: 07/12/2019 PCP: Nicholes Rough, PA-C   Brief Narrative: 59 year old who presents with the left knee and swelling.  Found to have septic arthritis of the knee.  Seen by orthopedic for left total knee arthroplasty.  Require intubation.  Found to have new onset A. fib with RVR.  Seen by cardiology.  Had DCCV.  Improved.  Transfer to [facility] on 1 /6/21.  Patient developed some drowsiness related to medication.  He needs to receive IV antibiotics through 09/06/2019.  Need a SNF but patient does not have insurance.   Assessment & Plan:   Principal Problem:   MRSA bacteremia Active Problems:   Septic joint ([place])   Atrial fibrillation with rapid ventricular response (HCC)   AKI (acute kidney injury) ([place])   Hyponatremia   Severe sepsis (HCC)   Severe sepsis with acute organ dysfunction (HCC)   Respiratory insufficiency   Acute pain of left knee   Prosthetic joint infection (HCC)   Tachypnea   Acute bilateral low back pain without sciatica   Left arm swelling   Red man syndrome   Acute metabolic encephalopathy   S/P revision of total knee   Malnutrition of moderate degree   Acute respiratory failure ([place])  1-New onset A. Fib: -Patient was seen in consultation by cardiology.  -Patient underwent DCCV and noted to have an enlarged right ventricle. -Patient currently on amiodarone and metoprolol for rate control. -Continue with Eliquis.  2-MRSA bacteremia/left knee septic joint Repeated blood cultures from 07/30/2019 - -TEE done negative for any vegetation. -Continue with IV vancomycin through 28/03/2020 -Patient need a skilled nursing facility although he does not have insurance will likely need to complete antibiotics in-house -will change morphine to dilaudid, discussed with nurse patient to received pain medication prior working with PT. If patient improve physically and is not able to require SNF placement could  discussed with ID others options for antibiotics.    3-Right knee pain Effusion;  Patient decline arthrocentesis.  He wants medical management. Received  Ibuprofen for 24 hours and voltaren gel.   Hypertension: Continue with Toprol-XL.  Acute metabolic encephalopathy Improved clinically. Continue with home regimen of Xanax 1 mg nightly.  Polysubstance substance use/history of alcohol use Need to follow-up with his pain management physician  Lower Extremity edema: Doppler of lower extremity: Negative for DVT  Anemia: Iron deficiency, B 12 [id number], Folic acid 26, iron 36, ferritin 219. Will start iron.   LE edema; IV lasix times one. Ted hose.   Nutrition Problem: Moderate Malnutrition Etiology: social / environmental circumstances    Signs/Symptoms: percent weight loss, mild muscle depletion    Interventions: Tube feeding, Prostat  Estimated body mass index is 30.09 kg/m as calculated from the following:   Height as of this encounter: 6\' 3"  (1.905 m).   Weight as of this encounter: 109.2 kg.   DVT prophylaxis: Eliquis Code Status: Full code Family Communication: Care discussed with patient Disposition Plan:  -Need to be discharged to skilled nursing facility.  Likely to remain in-house until he finished IV antibiotics completed 09/06/2019. -If patient improve physically and is not able to require SNF placement could discussed with ID others options for antibiotics. Patient encourage to work with PT.   Consultants:   Orthopedics: [name] 07/13/2019  Cardiology: Dr. Daneen Schick 07/13/2019   Procedures:   CT head 07/24/2019  CT left knee 07/23/2019  PICC line placement  Renal ultrasound 07/13/2019  MRI  L-spine 07/18/2019  2D echo 07/16/2019  TEE 08/02/2019  Left lower extremity Doppler 07/29/2019, 07/13/2019  Upper extremity Dopplers 07/29/2019, 07/21/2019  Left knee aspiration at bedside 07/13/2019 per [name]  Irrigation and debridement left total  knee with polyexchange per [name] 07/13/2019   Antimicrobials:  IV vancomycin 07/25/2019>>>>>> 09/06/2019  Subjective: Left knee pain improved/ he is able to move better.  Right LE with edema.   Objective: Vitals:   08/27/19 0629 08/27/19 1700 08/27/19 2126 08/28/19 0508  BP: 100/75 140/89 112/65 104/74  Pulse: 66 81 68 72  Resp: 18 19 17 16   Temp: 98.8 F (37.1 C)  98.1 F (36.7 C) 97.7 F (36.5 C)  TempSrc: Oral  Oral Oral  SpO2: 98% 100% 100% 97%  Weight:      Height:        Intake/Output Summary (Last 24 hours) at 08/28/2019 1230 Last data filed at 08/28/2019 1012 Gross per 24 hour  Intake 450 ml  Output 2400 ml  Net -1950 ml   Filed Weights   08/21/19 0544 08/24/19 0500 08/26/19 0500  Weight: 108 kg 108.5 kg 109.2 kg    Examination:  General exam: NAD Respiratory system: CTA Cardiovascular system: S 1, S 2 RRR Gastrointestinal system: BS present, soft, nt Central nervous system: Alert and oriented Extremities: plus 2 edema  left LE edema, dressing left Knee     Data Reviewed: I have personally reviewed following labs and imaging studies  CBC: Recent Labs  Lab 08/22/19 0330 08/27/19 0516  WBC  --  9.1  HGB 8.6* 9.2*  HCT 27.6* 30.5*  MCV  --  91.0  PLT  --  [id number]   Basic Metabolic Panel: Recent Labs  Lab 08/22/19 0330 08/23/19 0440 08/24/19 0452 08/27/19 0516 08/28/19 1003  NA 136 139 139 138 138  K 4.7 4.1 4.2 4.2 3.9  CL 97* 100 99 97* 98  CO2 29 30 30 31 29   GLUCOSE 128* 130* 121* 153* 168*  BUN 18 14 14 14 18   CREATININE 0.71 0.92 0.73 0.79 0.75  CALCIUM 8.9 9.0 9.2 9.3 9.4   GFR: Estimated Creatinine Clearance: 134.4 mL/min (by C-G formula based on SCr of 0.75 mg/dL). Liver Function Tests: No results for input(s): AST, ALT, ALKPHOS, BILITOT, PROT, ALBUMIN in the last 168 hours. No results for input(s): LIPASE, AMYLASE in the last 168 hours. No results for input(s): AMMONIA in the last 168 hours. Coagulation Profile: No  results for input(s): INR, PROTIME in the last 168 hours. Cardiac Enzymes: No results for input(s): CKTOTAL, CKMB, CKMBINDEX, TROPONINI in the last 168 hours. BNP (last 3 results) No results for input(s): PROBNP in the last 8760 hours. HbA1C: No results for input(s): HGBA1C in the last 72 hours. CBG: Recent Labs  Lab 08/27/19 1130 08/27/19 1647 08/27/19 2125 08/28/19 0811 08/28/19 1217  GLUCAP 96 144* 137* 145* 153*   Lipid Profile: No results for input(s): CHOL, HDL, LDLCALC, TRIG, CHOLHDL, LDLDIRECT in the last 72 hours. Thyroid Function Tests: No results for input(s): TSH, T4TOTAL, FREET4, T3FREE, THYROIDAB in the last 72 hours. Anemia Panel: No results for input(s): VITAMINB12, FOLATE, FERRITIN, TIBC, IRON, RETICCTPCT in the last 72 hours. Sepsis Labs: No results for input(s): PROCALCITON, LATICACIDVEN in the last 168 hours.  No results found for this or any previous visit (from the past 240 hour(s)).       Radiology Studies: DG Knee Complete 4 Views Right  Result Date: 08/26/2019 CLINICAL DATA:  Right knee pain. EXAM:  RIGHT KNEE - COMPLETE 4+ VIEW COMPARISON:  None. FINDINGS: No evidence of fracture or dislocation. A large knee joint effusion is seen. Degenerative spurring is seen involving the patella and tibial spines. Mild chondrocalcinosis also seen. No other osseous abnormality identified. IMPRESSION: 1. Large knee joint effusion. 2. Mild degenerative spurring and chondrocalcinosis, suspicious for CPPD arthropathy. Electronically Signed   By: Marlaine Hind M.D.   On: 08/26/2019 19:21        Scheduled Meds: . acetaminophen  650 mg Oral Q6H  . amiodarone  200 mg Oral Daily  . apixaban  5 mg Oral BID  . chlorhexidine  15 mL Mouth Rinse BID  . Chlorhexidine Gluconate Cloth  6 each Topical Daily  . diclofenac Sodium  2 g Topical QID  . docusate sodium  100 mg Oral BID  . feeding supplement (ENSURE ENLIVE)  237 mL Oral BID BM  . ferrous sulfate  325 mg Oral BID  WC  . folic acid  1 mg Oral Daily  . gabapentin  300 mg Oral TID  . insulin aspart  0-6 Units Subcutaneous TID AC & HS  . magnesium oxide  400 mg Oral BID  . metoprolol succinate  200 mg Oral Daily  . morphine  15 mg Oral Q12H  . multivitamin with minerals  1 tablet Oral Daily  . pantoprazole  40 mg Oral Daily  . psyllium  1 packet Oral Daily  . QUEtiapine  25 mg Oral BID  . sodium chloride flush  10-40 mL Intracatheter Q12H  . thiamine  100 mg Oral Daily   Continuous Infusions: . vancomycin 1,250 mg (08/27/19 1718)     LOS: 46 days    Time spent: 35 minutes    Zhyon Antenucci A Kamoni Gentles, MD [facility]   If 7PM-7AM, please contact night-coverage  08/28/2019, 12:30 PM

## 2019-08-28 NOTE — Progress Notes (Signed)
Patient ID: Vernon Frye, male   DOB: 1961-04-08, 59 y.o.   MRN: [id number] Subjective: Greater than 5 weeks status post resection of infected left total knee  Patient reports pain as moderate.  Not as much complaints of pain on rounds this am  Objective:   VITALS:   Vitals:   08/27/19 2126 08/28/19 0508  BP: 112/65 104/74  Pulse: 68 72  Resp: 17 16  Temp: 98.1 F (36.7 C) 97.7 F (36.5 C)  SpO2: 100% 97%    Incision: dressing C/D/I  Still with limited to no active DF at left ankle Significant Bilateral L>R LE edema  LABS Recent Labs    08/27/19 0516  HGB 9.2*  HCT 30.5*  WBC 9.1  PLT 326    Recent Labs    08/27/19 0516  NA 138  K 4.2  BUN 14  CREATININE 0.79  GLUCOSE 153*    No results for input(s): LABPT, INR in the last 72 hours.   Assessment/Plan: Greater than 5 weeks status post resection of infected left TKR LLE foot drop BLE edema  Plan: Consider lasix if appropriate to assist in improving edema Etiology of left foot drop unknown, infection related concerns, edema, position, significant bed rest Nonetheless needs to stretch foot and ankle as I reviewed with him this am Wear AFO as much as possible even if ankle not in perfect position to try and prevent significant equinus contracture Will need this followed and or worked up as outpatient  Continue current pain regiment with intention of discontinuing the IV meds for eventual discharge to home DVT prophylaxis while bed bound and limited OOB acivity

## 2019-08-29 LAB — GLUCOSE, CAPILLARY
Glucose-Capillary: 146 mg/dL — ABNORMAL HIGH (ref 70–99)
Glucose-Capillary: 155 mg/dL — ABNORMAL HIGH (ref 70–99)
Glucose-Capillary: 149 mg/dL — ABNORMAL HIGH (ref 70–99)
Glucose-Capillary: 133 mg/dL — ABNORMAL HIGH (ref 70–99)

## 2019-08-29 LAB — VANCOMYCIN, TROUGH: Vancomycin Tr: 17 ug/mL (ref 15–20)

## 2019-08-29 NOTE — Progress Notes (Signed)
Physical Therapy Treatment Patient Details Name: Vernon Frye MRN: [id number] DOB: Dec 02, 1960 Today's Date: 08/29/2019    History of Present Illness Pt is a 59 y/o M PMH Afib, HTN, L TKR (2016) who presented to ED with Lt knee pain. 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 Desaturated with hypoxemic respiratory failure requiring 3LNC. Pt underwent L TKA removal with placement of antibiotic spacers on 12/29, agitated after procedure and unable to be extubated. Extubated 07/31/19.    PT Comments    Pt making good progress with sliding board transfer to w/c with cues.  Continues to need min A for L LE with all transfers - may benefit from leg lifter to assist.  At this time continue to recommend SNF as pt lives alone and continues to need assist.  Pt with good pain control and motivated to work with therapy today.   Follow Up Recommendations  SNF;Supervision/Assistance - 24 hour     Equipment Recommendations  Wheelchair (measurements PT);Wheelchair cushion (measurements PT);Hospital bed;3in1 (PT)    Recommendations for Other Services       Precautions / Restrictions Precautions Precautions: Fall Precaution Comments: watch HR Required Braces or Orthoses: Knee Immobilizer - Left Knee Immobilizer - Left: Other (comment)(on for all mobility) Restrictions Weight Bearing Restrictions: Yes RUE Weight Bearing: Weight bearing as tolerated LUE Weight Bearing: Weight bearing as tolerated LLE Weight Bearing: Touchdown weight bearing Other Position/Activity Restrictions: No L knee ROM    Mobility  Bed Mobility Overal bed mobility: Needs Assistance Bed Mobility: Supine to Sit;Sit to Supine     Supine to sit: Min assist Sit to supine: Min assist   General bed mobility comments: increased time for transfers; use of bed rails and HOB elevated; assist to manage R LE on and off bed  Transfers Overall transfer level:  Needs assistance              Lateral/Scoot Transfers: Min assist General transfer comment: sliding board to and from drop arm w/c going toward L side; increased time; cues for technique; assist for R LE  Ambulation/Gait                 Theme park manager mobility: Yes Wheelchair propulsion: Both upper extremities Wheelchair parts: Supervision/cueing Distance: 300 Wheelchair Assistance Details (indicate cue type and reason): cued initially for safety but was able to navigate in tight spaces without hitting L LE that was on extended leg rest  Modified Rankin (Stroke Patients Only)       Balance Overall balance assessment: Needs assistance Sitting-balance support: Feet supported Sitting balance-Leahy Scale: Good                                      Cognition Arousal/Alertness: Awake/alert Behavior During Therapy: WFL for tasks assessed/performed Overall Cognitive Status: Within Functional Limits for tasks assessed                                        Exercises      General Comments General comments (skin integrity, edema, etc.): Pt asking about if he would be able to go home instead rehab since he did so well with transfer.  Discussed still needing min A for L LE with transfers, has step to get in home, lives alone, and would need to make sure doors wide enough for w/c      Pertinent Vitals/Pain Pain Assessment: Faces Faces Pain Scale: Hurts a little bit Pain Location: L LE (no pain in right today) Pain Descriptors / Indicators: Aching;Guarding Pain Intervention(s): Repositioned;Limited activity within patient's tolerance;Monitored during session;Premedicated before session    Home Living                      Prior Function            PT Goals (current goals can now be found in the care plan section) Additional Goals Additional Goal #1: Pt will sliding  board transfer to w/c with supervision. Progress towards PT goals: Progressing toward goals;Goals met and updated - see care plan    Frequency    Min 3X/week      PT Plan Current plan remains appropriate    Co-evaluation              AM-PAC PT "6 Clicks" Mobility   Outcome Measure  Help needed turning from your back to your side while in a flat bed without using bedrails?: A Little Help needed moving from lying on your back to sitting on the side of a flat bed without using bedrails?: A Little Help needed moving to and from a bed to a chair (including a wheelchair)?: A Little Help needed standing up from a chair using your arms (e.g., wheelchair or bedside chair)?: A Lot Help needed to walk in hospital room?: Total Help needed climbing 3-5 steps with a railing? : Total 6 Click Score: 13    End of Session Equipment Utilized During Treatment: Left knee immobilizer Activity Tolerance: Patient tolerated treatment well Patient left: in bed;with call bell/phone within reach;with bed alarm set Nurse Communication: Mobility status;Precautions;Weight bearing status PT Visit Diagnosis: Other abnormalities of gait and mobility (R26.89);Pain     Time: 1710-1752 PT Time Calculation (min) (ACUTE ONLY): 42 min  Charges:  $Therapeutic Activity: 38-52 mins                     Maggie Font, PT Acute Rehab Services Pager [phone] [facility] [phone] [facility] [phone]    Karlton Lemon 08/29/2019, 6:07 PM

## 2019-08-29 NOTE — Progress Notes (Signed)
VAST RN called pharmacy and spoke with pharmacist to determine what labs need to be drawn as related to Vancomycin. Vanc peak was overlooked today at 1100. Vanc trough is currently scheduled for 1730 this evening before 1800 dose of Vanc. Pharmacy will evaluate trough level and place an order for a peak draw after vanc dose if it is needed. This patient's vanc dose runs over 2 hours.

## 2019-08-29 NOTE — TOC Progression Note (Signed)
Transition of Care [facility]) - Progression Note    Patient Details  Name: Vernon Frye MRN: [id number] Date of Birth: [birth year]  [facility]) CM/SW Contact  Jacalyn Lefevre Edson Snowball, RN Phone Number: 08/29/2019, 2:51 PM  Clinical Narrative:     Patient from home alone. No insurance. Spoke to patient prior , at that time he was going to check with friends to see if they can assist him at discharge. Also encouraged patient to work with PT/OT as much as possible to get stronger.   Spoke with patient today. Patient states he has no friends to help him at discharge. Patient uninsured and cannot afford SNF. PT/OT scheduled to work with patient today . Patient aware.   Patient aware last dose of ABX due 09/06/19.  Patient has walker, crutches and shower chair at home.   Expected Discharge Plan: [place] Barriers to Discharge: Continued Medical Work up, Inadequate or no insurance  Expected Discharge Plan and Services Expected Discharge Plan: [place]   Discharge Planning Services: CM Consult   Living arrangements for the past 2 months: Single Family Home                                       Social Determinants of Health (SDOH) Interventions    Readmission Risk Interventions No flowsheet data found.

## 2019-08-29 NOTE — Progress Notes (Signed)
PROGRESS NOTE    Vernon Frye  [id number] DOB: 1960/12/03 DOA: 07/12/2019 PCP: Nicholes Rough, PA-C   Brief Narrative: 59 year old who presents with the left knee and swelling.  Found to have septic arthritis of the knee.  Seen by orthopedic for left total knee arthroplasty.  Require intubation.  Found to have new onset A. fib with RVR.  Seen by cardiology.  Had DCCV.  Improved.  Transfer to [facility] on 1 /6/21.  Patient developed some drowsiness related to medication.  He needs to receive IV antibiotics through 09/06/2019.  Need a SNF but patient does not have insurance.   Assessment & Plan:   Principal Problem:   MRSA bacteremia Active Problems:   Septic joint ([place])   Atrial fibrillation with rapid ventricular response (HCC)   AKI (acute kidney injury) ([place])   Hyponatremia   Severe sepsis (HCC)   Severe sepsis with acute organ dysfunction (HCC)   Respiratory insufficiency   Acute pain of left knee   Prosthetic joint infection (HCC)   Tachypnea   Acute bilateral low back pain without sciatica   Left arm swelling   Red man syndrome   Acute metabolic encephalopathy   S/P revision of total knee   Malnutrition of moderate degree   Acute respiratory failure ([place])  1-New onset A. Fib: -Patient was seen in consultation by cardiology.  -Patient underwent DCCV and noted to have an enlarged right ventricle. -Patient currently on amiodarone and metoprolol for rate control. -Continue with Eliquis. Stable.   2-MRSA bacteremia/left knee septic joint Repeated blood cultures from 07/30/2019 - -TEE done negative for any vegetation. -Continue with IV vancomycin through 28/03/2020 -Patient need a skilled nursing facility although he does not have insurance will likely need to complete antibiotics in-house -will change morphine to dilaudid, discussed with nurse patient to received pain medication prior working with PT. If patient improve physically and is not able to require SNF placement  could discussed with ID others options for antibiotics.    3-Right knee pain Effusion;  Patient decline arthrocentesis.  He wants medical management. Received  Ibuprofen for 24 hours and voltaren gel.  Pain improved.   Hypertension: Continue with Toprol-XL.  Acute metabolic encephalopathy Improved clinically. Continue with home regimen of Xanax 1 mg nightly.  Polysubstance substance use/history of alcohol use Need to follow-up with his pain management physician  Lower Extremity edema: Doppler of lower extremity: Negative for DVT  Anemia: Iron deficiency, B 12 Q000111Q, Folic acid 26, iron 36, ferritin 219. Started iron supplement.   LE edema; IV lasix times one. Ted hose.  Much improved.   Nutrition Problem: Moderate Malnutrition Etiology: social / environmental circumstances    Signs/Symptoms: percent weight loss, mild muscle depletion    Interventions: Tube feeding, Prostat  Estimated body mass index is 30.09 kg/m as calculated from the following:   Height as of this encounter: 6\' 3"  (1.905 m).   Weight as of this encounter: 109.2 kg.   DVT prophylaxis: Eliquis Code Status: Full code Family Communication: Care discussed with patient Disposition Plan:  -Need to be discharged to skilled nursing facility.  Likely to remain in-house until he finished IV antibiotics completed 09/06/2019. -If patient improve physically and is not able to require SNF placement could discussed with ID others options for antibiotics. Patient encourage to work with PT.   Consultants:   Orthopedics: [name] 07/13/2019  Cardiology: Dr. Daneen Schick 07/13/2019   Procedures:   CT head 07/24/2019  CT left knee 07/23/2019  PICC  line placement  Renal ultrasound 07/13/2019  MRI L-spine 07/18/2019  2D echo 07/16/2019  TEE 08/02/2019  Left lower extremity Doppler 07/29/2019, 07/13/2019  Upper extremity Dopplers 07/29/2019, 07/21/2019  Left knee aspiration at bedside 07/13/2019 per Dr.  Alvan Dame  Irrigation and debridement left total knee with polyexchange per [name] 07/13/2019   Antimicrobials:  IV vancomycin 07/25/2019>>>>>> 09/06/2019  Subjective: Right knee pain improved.  Left keen pain better  Objective: Vitals:   08/28/19 0508 08/28/19 1300 08/28/19 2129 08/29/19 0613  BP: 104/74 (!) 149/83 (!) 127/92 124/79  Pulse: 72 89 78 61  Resp: 16 18 18 16   Temp: 97.7 F (36.5 C) 99 F (37.2 C) 97.7 F (36.5 C)   TempSrc: Oral Oral Oral Oral  SpO2: 97% 100% 99% 100%  Weight:      Height:        Intake/Output Summary (Last 24 hours) at 08/29/2019 1533 Last data filed at 08/29/2019 [id number] Gross per 24 hour  Intake 830 ml  Output 1075 ml  Net -245 ml   Filed Weights   08/21/19 0544 08/24/19 0500 08/26/19 0500  Weight: 108 kg 108.5 kg 109.2 kg    Examination:  General exam: NAD Respiratory system: CTA Cardiovascular system: S 1, S 2 RRR Gastrointestinal : system; BS present, soft, nt Central nervous system: Alert and oriented Extremities: Trace  edema  left LE edema, dressing left Knee     Data Reviewed: I have personally reviewed following labs and imaging studies  CBC: Recent Labs  Lab 08/27/19 0516  WBC 9.1  HGB 9.2*  HCT 30.5*  MCV 91.0  PLT [id number]   Basic Metabolic Panel: Recent Labs  Lab 08/23/19 0440 08/24/19 0452 08/27/19 0516 08/28/19 1003  NA 139 139 138 138  K 4.1 4.2 4.2 3.9  CL 100 99 97* 98  CO2 30 30 31 29   GLUCOSE 130* 121* 153* 168*  BUN 14 14 14 18   CREATININE 0.92 0.73 0.79 0.75  CALCIUM 9.0 9.2 9.3 9.4   GFR: Estimated Creatinine Clearance: 134.4 mL/min (by C-G formula based on SCr of 0.75 mg/dL). Liver Function Tests: No results for input(s): AST, ALT, ALKPHOS, BILITOT, PROT, ALBUMIN in the last 168 hours. No results for input(s): LIPASE, AMYLASE in the last 168 hours. No results for input(s): AMMONIA in the last 168 hours. Coagulation Profile: No results for input(s): INR, PROTIME in the last 168 hours.  Cardiac Enzymes: No results for input(s): CKTOTAL, CKMB, CKMBINDEX, TROPONINI in the last 168 hours. BNP (last 3 results) No results for input(s): PROBNP in the last 8760 hours. HbA1C: No results for input(s): HGBA1C in the last 72 hours. CBG: Recent Labs  Lab 08/28/19 1217 08/28/19 1738 08/28/19 2126 08/29/19 0807 08/29/19 1247  GLUCAP 153* 166* 89 146* 155*   Lipid Profile: No results for input(s): CHOL, HDL, LDLCALC, TRIG, CHOLHDL, LDLDIRECT in the last 72 hours. Thyroid Function Tests: No results for input(s): TSH, T4TOTAL, FREET4, T3FREE, THYROIDAB in the last 72 hours. Anemia Panel: No results for input(s): VITAMINB12, FOLATE, FERRITIN, TIBC, IRON, RETICCTPCT in the last 72 hours. Sepsis Labs: No results for input(s): PROCALCITON, LATICACIDVEN in the last 168 hours.  No results found for this or any previous visit (from the past 240 hour(s)).       Radiology Studies: No results found.      Scheduled Meds: . acetaminophen  650 mg Oral Q6H  . amiodarone  200 mg Oral Daily  . apixaban  5 mg Oral BID  . chlorhexidine  15 mL Mouth Rinse BID  . Chlorhexidine Gluconate Cloth  6 each Topical Daily  . diclofenac Sodium  2 g Topical QID  . docusate sodium  100 mg Oral BID  . feeding supplement (ENSURE ENLIVE)  237 mL Oral BID BM  . ferrous sulfate  325 mg Oral BID WC  . folic acid  1 mg Oral Daily  . gabapentin  300 mg Oral TID  . ibuprofen  400 mg Oral BID  . insulin aspart  0-6 Units Subcutaneous TID AC & HS  . magnesium oxide  400 mg Oral BID  . metoprolol succinate  200 mg Oral Daily  . morphine  15 mg Oral Q12H  . multivitamin with minerals  1 tablet Oral Daily  . pantoprazole  40 mg Oral Daily  . psyllium  1 packet Oral Daily  . QUEtiapine  25 mg Oral BID  . sodium chloride flush  10-40 mL Intracatheter Q12H  . thiamine  100 mg Oral Daily   Continuous Infusions: . vancomycin 1,250 mg (08/29/19 0618)     LOS: 47 days    Time spent: 35 minutes      A , MD [facility]   If 7PM-7AM, please contact night-coverage  08/29/2019, 3:33 PM

## 2019-08-30 LAB — VANCOMYCIN, TROUGH: Vancomycin Tr: 15 ug/mL (ref 15–20)

## 2019-08-30 LAB — GLUCOSE, CAPILLARY
Glucose-Capillary: 95 mg/dL (ref 70–99)
Glucose-Capillary: 115 mg/dL — ABNORMAL HIGH (ref 70–99)
Glucose-Capillary: 95 mg/dL (ref 70–99)
Glucose-Capillary: 155 mg/dL — ABNORMAL HIGH (ref 70–99)

## 2019-08-30 LAB — VANCOMYCIN, PEAK: Vancomycin Pk: 19 ug/mL — ABNORMAL LOW (ref 30–40)

## 2019-08-30 NOTE — Progress Notes (Signed)
Nutrition Follow-up  DOCUMENTATION CODES:   Non-severe (moderate) malnutrition in context of social or environmental circumstances  INTERVENTION:   -ContinueEnsure Enlive po BID, each supplement provides 350 kcal and 20 grams of protein -Continue MVI with minerals daily  NUTRITION DIAGNOSIS:   Moderate Malnutrition related to social / environmental circumstances as evidenced by percent weight loss, mild muscle depletion.  Ongoing  GOAL:   Patient will meet greater than or equal to 90% of their needs  Progressing   MONITOR:   PO intake, Supplement acceptance, Labs, Weight trends, Skin, I & O's  REASON FOR ASSESSMENT:   Ventilator, Consult Enteral/tube feeding initiation and management  ASSESSMENT:   Patient with PMH significant for HTN, L TKR (2016), and Afib. Presents this admission with septic arthritis of L new and new onset AF with RVR.  12/16- s/p I&D total L knee  12/29- s/p total L knee revision/replacement 1/3- extubated 1/5- s/p TEE- no vegetations; successful DCCV  Reviewed I/O's: +995 ml x 24 hours and -19 L since 08/16/19  UOP: 1.2 L x 24 hours   Attempted to speak with pt via phone, however, no answer.Pt remains with good appetite, consuming 100% of meals. Pt is also consuming Ensure supplements.   Pt with increased nutrient needs and would benefit from nutrient dense supplement. One Ensure Enlive supplement provides 350 kcals, 20 grams protein, and 44-45 grams of carbohydrate vs one Glucerna shake supplement, which provides 220 kcals, 10 grams of protein, and 26 grams of carbohydrate. Given pt's hx of DM, RD will continue to monitor PO intake, CBGS, and adjust supplement regimen as appropriate.   Medications reviewed and include colace, ferrous sulfate, folic acid, thiamine, and magnesium oxide.   Per MD notes, pt will remain hospitalized until 09/06/19 for completion of IV antibiotics.He has a history of refusing therapy.   Labs reviewed: CBGS:  95-155 (inpatient orders for glycemic control are 0-6 units insulin aspart TID with meals and q HS).    Diet Order:   Diet Order            Diet Carb Modified Fluid consistency: Thin; Room service appropriate? Yes with Assist  Diet effective now              EDUCATION NEEDS:   Not appropriate for education at this time  Skin:  Skin Assessment: Skin Integrity Issues: Skin Integrity Issues:: Incisions, Other (Comment) Incisions: L knee/L leg Other: MASD-buttocks, scrotum  Last BM:  08/29/19  Height:   Ht Readings from Last 1 Encounters:  07/13/19 6\' 3"  (1.905 m)    Weight:   Wt Readings from Last 1 Encounters:  08/26/19 109.2 kg    Ideal Body Weight:  89.1 kg  BMI:  Body mass index is 30.09 kg/m.  Estimated Nutritional Needs:   Kcal:  2300-2500 kcal  Protein:  115-130 grams  Fluid:  >/= 2.3 L/day    Legion Discher A. Jimmye Norman, RD, LDN, [place] Registered Dietitian II Certified Diabetes Care and Education Specialist Pager: [phone] After hours Pager: [phone]

## 2019-08-30 NOTE — Progress Notes (Signed)
PROGRESS NOTE    Vernon Frye  [id number] DOB: 12/26/1960 DOA: 07/12/2019 PCP: Nicholes Rough, PA-C   Brief Narrative: 59 year old who presents with the left knee and swelling.  Found to have septic arthritis of the knee.  Seen by orthopedic for left total knee arthroplasty.  Require intubation.  Found to have new onset A. fib with RVR.  Seen by cardiology.  Had DCCV.  Improved.  Transfer to [facility] on 1 /6/21.  Patient developed some drowsiness related to medication.  He needs to receive IV antibiotics through 09/06/2019.  Need a SNF but patient does not have insurance.   Assessment & Plan:   Principal Problem:   MRSA bacteremia Active Problems:   Septic joint ([place])   Atrial fibrillation with rapid ventricular response (HCC)   AKI (acute kidney injury) ([place])   Hyponatremia   Severe sepsis (HCC)   Severe sepsis with acute organ dysfunction (HCC)   Respiratory insufficiency   Acute pain of left knee   Prosthetic joint infection (HCC)   Tachypnea   Acute bilateral low back pain without sciatica   Left arm swelling   Red man syndrome   Acute metabolic encephalopathy   S/P revision of total knee   Malnutrition of moderate degree   Acute respiratory failure ([place])  1-New onset A. Fib: -Patient was seen in consultation by cardiology.  -Patient underwent DCCV and noted to have an enlarged right ventricle. -Patient currently on amiodarone and metoprolol for rate control. -Continue with Eliquis. -Stable.   2-MRSA bacteremia/left knee septic joint Repeated blood cultures from 07/30/2019 - -TEE done negative for any vegetation. -Continue with IV vancomycin through 09/06/2019 -Patient need a skilled nursing facility although he does not have insurance will likely need to complete antibiotics in-house -Will change morphine to dilaudid, discussed with nurse patient to received pain medication prior working with PT. -he has been able to work more with PT. Hopefully mobility improve to the  point he wont require SNF. He will complete antibiotics 09-06-2019  3-Right knee pain Effusion;  Patient decline arthrocentesis.  He wants medical management.  Continue with  voltaren gel. Ibuprofen for 2 more days./  Pain improved.   Hypertension: Continue with Toprol-XL.  Acute metabolic encephalopathy Improved clinically. Continue with home regimen of Xanax 1 mg nightly.  Polysubstance substance use/history of alcohol use Need to follow-up with his pain management physician  Lower Extremity edema: Doppler of lower extremity: Negative for DVT Improved with Ted hose and elevation.  Received one time dose lasix.   Anemia: Iron deficiency, B 12 Q000111Q, Folic acid 26, iron 36, ferritin 219. Started iron supplement.     Nutrition Problem: Moderate Malnutrition Etiology: social / environmental circumstances    Signs/Symptoms: percent weight loss, mild muscle depletion    Interventions: Tube feeding, Prostat  Estimated body mass index is 30.09 kg/m as calculated from the following:   Height as of this encounter: 6\' 3"  (1.905 m).   Weight as of this encounter: 109.2 kg.   DVT prophylaxis: Eliquis Code Status: Full code Family Communication: Care discussed with patient Disposition Plan:  -Need to be discharged to skilled nursing facility.  Likely to remain in-house until he finished IV antibiotics completed 09/06/2019. -If patient improve physically and is not able to require SNF placement could discussed with ID others options for antibiotics. Patient encourage to work with PT.    Consultants:   Orthopedics: [name] 07/13/2019  Cardiology: Dr. Daneen Schick 07/13/2019   Procedures:   CT head 07/24/2019  CT left knee 07/23/2019  PICC line placement  Renal ultrasound 07/13/2019  MRI L-spine 07/18/2019  2D echo 07/16/2019  TEE 08/02/2019  Left lower extremity Doppler 07/29/2019, 07/13/2019  Upper extremity Dopplers 07/29/2019, 07/21/2019  Left knee aspiration  at bedside 07/13/2019 per [name]  Irrigation and debridement left total knee with polyexchange per [name] 07/13/2019   Antimicrobials:  IV vancomycin 07/25/2019>>>>>> 09/06/2019  Subjective: He was able to go down the hall yesterday with PT. He was having a lot of pain last night  Objective: Vitals:   08/28/19 1300 08/28/19 2129 08/29/19 0613 08/30/19 0348  BP: (!) 149/83 (!) 127/92 124/79 130/78  Pulse: 89 78 61 68  Resp: 18 18 16 19   Temp: 99 F (37.2 C) 97.7 F (36.5 C)  98.2 F (36.8 C)  TempSrc: Oral Oral Oral Oral  SpO2: 100% 99% 100% 100%  Weight:      Height:        Intake/Output Summary (Last 24 hours) at 08/30/2019 1040 Last data filed at 08/30/2019 1000 Gross per 24 hour  Intake 1650 ml  Output 1300 ml  Net 350 ml   Filed Weights   08/21/19 0544 08/24/19 0500 08/26/19 0500  Weight: 108 kg 108.5 kg 109.2 kg    Examination:  General exam: NAD Respiratory system: CTA Cardiovascular system: S 1, S 2 RRR Gastrointestinal : system; BS present, soft, nt Central nervous system: Alert and oriented Extremities:  dressing left Knee, less LE edema      Data Reviewed: I have personally reviewed following labs and imaging studies  CBC: Recent Labs  Lab 08/27/19 0516  WBC 9.1  HGB 9.2*  HCT 30.5*  MCV 91.0  PLT A999333   Basic Metabolic Panel: Recent Labs  Lab 08/24/19 0452 08/27/19 0516 08/28/19 1003  NA 139 138 138  K 4.2 4.2 3.9  CL 99 97* 98  CO2 30 31 29   GLUCOSE 121* 153* 168*  BUN 14 14 18   CREATININE 0.73 0.79 0.75  CALCIUM 9.2 9.3 9.4   GFR: Estimated Creatinine Clearance: 134.4 mL/min (by C-G formula based on SCr of 0.75 mg/dL). Liver Function Tests: No results for input(s): AST, ALT, ALKPHOS, BILITOT, PROT, ALBUMIN in the last 168 hours. No results for input(s): LIPASE, AMYLASE in the last 168 hours. No results for input(s): AMMONIA in the last 168 hours. Coagulation Profile: No results for input(s): INR, PROTIME in the last 168  hours. Cardiac Enzymes: No results for input(s): CKTOTAL, CKMB, CKMBINDEX, TROPONINI in the last 168 hours. BNP (last 3 results) No results for input(s): PROBNP in the last 8760 hours. HbA1C: No results for input(s): HGBA1C in the last 72 hours. CBG: Recent Labs  Lab 08/29/19 0807 08/29/19 1247 08/29/19 1700 08/29/19 2254 08/30/19 0804  GLUCAP 146* 155* 149* 133* 95   Lipid Profile: No results for input(s): CHOL, HDL, LDLCALC, TRIG, CHOLHDL, LDLDIRECT in the last 72 hours. Thyroid Function Tests: No results for input(s): TSH, T4TOTAL, FREET4, T3FREE, THYROIDAB in the last 72 hours. Anemia Panel: No results for input(s): VITAMINB12, FOLATE, FERRITIN, TIBC, IRON, RETICCTPCT in the last 72 hours. Sepsis Labs: No results for input(s): PROCALCITON, LATICACIDVEN in the last 168 hours.  No results found for this or any previous visit (from the past 240 hour(s)).       Radiology Studies: No results found.      Scheduled Meds: . acetaminophen  650 mg Oral Q6H  . amiodarone  200 mg Oral Daily  . apixaban  5 mg  Oral BID  . chlorhexidine  15 mL Mouth Rinse BID  . Chlorhexidine Gluconate Cloth  6 each Topical Daily  . diclofenac Sodium  2 g Topical QID  . docusate sodium  100 mg Oral BID  . feeding supplement (ENSURE ENLIVE)  237 mL Oral BID BM  . ferrous sulfate  325 mg Oral BID WC  . folic acid  1 mg Oral Daily  . gabapentin  300 mg Oral TID  . ibuprofen  400 mg Oral BID  . insulin aspart  0-6 Units Subcutaneous TID AC & HS  . magnesium oxide  400 mg Oral BID  . metoprolol succinate  200 mg Oral Daily  . morphine  15 mg Oral Q12H  . multivitamin with minerals  1 tablet Oral Daily  . pantoprazole  40 mg Oral Daily  . psyllium  1 packet Oral Daily  . QUEtiapine  25 mg Oral BID  . sodium chloride flush  10-40 mL Intracatheter Q12H  . thiamine  100 mg Oral Daily   Continuous Infusions: . vancomycin 1,250 mg (08/30/19 0624)     LOS: 48 days    Time spent: 35  minutes    Kahliyah Dick A Deloris Moger, MD [facility]   If 7PM-7AM, please contact night-coverage  08/30/2019, 10:40 AM

## 2019-08-30 NOTE — Evaluation (Signed)
Occupational Therapy Evaluation Patient Details Name: Vernon Frye MRN: [id number] DOB: 1961-02-16 Today's Date: 08/30/2019    History of Present Illness Pt is a 59 y/o M PMH Afib, HTN, L TKR (2016) who presented to ED with Lt knee pain. 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 Desaturated with hypoxemic respiratory failure requiring 3LNC. Pt underwent L TKA removal with placement of antibiotic spacers on 12/29, agitated after procedure and unable to be extubated. Extubated 07/31/19.   Clinical Impression   Patient agreeable to therapy after coordinating with nursing for pain medications. Patient demonstrates improved mobility requiring min A for L LE during bed mobility and min guard for lateral scoots from edge of bed to drop arm recliner. Patient does require increase time between transitional movements and with functional transfers. Patient still requiring extensive assist for LB ADLs such as dressing. Recommend continued acute OT services to maximize patient independence with self care, functional transfers.     Follow Up Recommendations  SNF;Supervision/Assistance - 24 hour    Equipment Recommendations  Other (comment)(defer to next venue)       Precautions / Restrictions Precautions Precautions: Fall Precaution Comments: watch HR Required Braces or Orthoses: Knee Immobilizer - Left Knee Immobilizer - Left: On when out of bed or walking Restrictions Weight Bearing Restrictions: Yes RUE Weight Bearing: Weight bearing as tolerated LUE Weight Bearing: Weight bearing as tolerated LLE Weight Bearing: Touchdown weight bearing Other Position/Activity Restrictions: No L knee ROM      Mobility Bed Mobility Overal bed mobility: Needs Assistance Bed Mobility: Supine to Sit     Supine to sit: Min assist     General bed mobility comments: min A for support of L LE to edge of bed  Transfers Overall  transfer level: Needs assistance Equipment used: (drop arm chair) Transfers: Lateral/Scoot Transfers          Lateral/Scoot Transfers: Min guard General transfer comment: lateral scoots to drop arm recliner, min guard for safety and cues for sequencing    Balance Overall balance assessment: Needs assistance Sitting-balance support: Feet supported Sitting balance-Leahy Scale: Good                                     ADL either performed or assessed with clinical judgement   ADL Overall ADL's : Needs assistance/impaired                     Lower Body Dressing: Total assistance;Sitting/lateral leans Lower Body Dressing Details (indicate cue type and reason): don socks, patient reports difficulty with this prior to knee sx because of his back' Toilet Transfer: Min guard;Requires drop arm(lateral scooting) Toilet Transfer Details (indicate cue type and reason): simulated to recliner via lateral scoot transfer with min guard assist and increased time         Functional mobility during ADLs: Min guard;Cueing for sequencing General ADL Comments: patient demonstrates improved mobility in order to participate in self care                  Pertinent Vitals/Pain Pain Assessment: Faces Faces Pain Scale: Hurts little more Pain Location: L knee Pain Descriptors / Indicators: Aching;Guarding Pain Intervention(s): Premedicated before session              Cognition Arousal/Alertness: Awake/alert Behavior During Therapy: WFL for tasks assessed/performed Overall Cognitive Status:  Within Functional Limits for tasks assessed                                                                     OT Goals(Current goals can be found in the care plan section) Acute Rehab OT Goals Patient Stated Goal: get stronger OT Goal Formulation: With patient Time For Goal Achievement: 09/13/19 Potential to Achieve Goals: Good ADL Goals Pt Will  Perform Grooming: with min assist;standing Pt Will Perform Upper Body Bathing: with set-up;sitting Pt Will Transfer to Toilet: with supervision;with transfer board(drop arm commode) Pt Will Perform Toileting - Clothing Manipulation and hygiene: with supervision;sitting/lateral leans Additional ADL Goal #1: Patient will perform bed mobility at supervision level in preparation for self care tasks  OT Frequency: Min 2X/week              AM-PAC OT "6 Clicks" Daily Activity     Outcome Measure Help from another person eating meals?: None Help from another person taking care of personal grooming?: A Little Help from another person toileting, which includes using toliet, bedpan, or urinal?: A Lot Help from another person bathing (including washing, rinsing, drying)?: A Lot Help from another person to put on and taking off regular upper body clothing?: A Little Help from another person to put on and taking off regular lower body clothing?: Total 6 Click Score: 15   End of Session Equipment Utilized During Treatment: Left knee immobilizer Nurse Communication: Mobility status  Activity Tolerance: Patient tolerated treatment well Patient left: in chair;with call bell/phone within reach;with chair alarm set  OT Visit Diagnosis: Muscle weakness (generalized) (M62.81);Pain Pain - Right/Left: Left Pain - part of body: Knee;Leg                Time: 1022-1100 OT Time Calculation (min): 38 min Charges:  OT General Charges $OT Visit: 1 Visit OT Treatments $Self Care/Home Management : 38-52 mins  Shon Millet OT OT office: [place] 08/30/2019, 11:39 AM

## 2019-08-30 NOTE — Progress Notes (Signed)
Pharmacy Antibiotic Note  Vernon Frye is a 59 y.o. male admitted on 07/12/2019 with MRSA bacteremia and septic knee joint infection.  Pharmacy has been consulted for vancomycin dosing. Pt is on extended infusion time (2 hours) to alleviate Red Man Syndrome; no adverse effects reported since this was begun.   Vancomycin levels after fourth dose of vancomycin 1250 mg IV Q 12 hr regimen (dose given today at 06:24 AM) are as follows: Vancomycin peak level was 19 mg/L at 14:40 PM (drawn late) Vancomycin trough level was 15 mg/L at 19:22 PM (drawn late) Calculated vancomycin AUC, using the above levels, is 493.2, which is within the goal vancomycin AUC range of 400-550  WBC 9.1, afebrile; Scr 0.75, CrCl >120 ml/min (renal function stable)    Plan: Continue vancomycin 1250 mg IV Q 12 hrs (infused over 120 mins) Monitor WBC, temp, renal function (at least twice weekly),  ID planning vancomycin continuing through 09/06/19 (6 weeks from removal of prosthetic material on 07/26/19)  Height: 6\' 3"  (190.5 cm) Weight: 240 lb 11.9 oz (109.2 kg) IBW/kg (Calculated) : 84.5  Temp (24hrs), Avg:98.6 F (37 C), Min:98.2 F (36.8 C), Max:99 F (37.2 C)  Recent Labs  Lab 08/24/19 0452 08/26/19 2130 08/27/19 0516 08/27/19 0516 08/28/19 1003 08/29/19 1732 08/30/19 1440 08/30/19 1922  WBC  --   --  9.1  --   --   --   --   --   CREATININE 0.73  --  0.79  --  0.75  --   --   --   VANCOTROUGH  --   --  12*   < >  --  17  --  15  VANCOPEAK  --  22*  --   --   --   --  19*  --    < > = values in this interval not displayed.    Estimated Creatinine Clearance: 134.4 mL/min (by C-G formula based on SCr of 0.75 mg/dL).    Allergies  Allergen Reactions  . Vancomycin Shortness Of Breath and Rash    [name]     Antimicrobials this admission: Vanc 12/22>>(09/06/19) Ceftriaxone 12/16 x1 Zosyn 12/16 x1  Dapto 12/16>>12/22  Microbiology results: 12/16 BCx 2/2 MRSA 12/16L knee fluid: abundant  MRSA 12/16 Ucx: neg 12/16 Grp A strep - neg 12/17 BCx >>1/2 SA 12/18 BCx: NG/final 1/2 Cdiff >> neg 1/2 UCx >>  >100,000 colonies/mL Klebsiella oxytoca (no tx, asymptomatic) 1/2 BCx: NG/final  Gillermina Hu, PharmD, BCPS, [facility] Clinical Pharmacist 08/30/19, 20:40 PM

## 2019-08-30 NOTE — Progress Notes (Addendum)
IV team paged several times and notified about Vancomycin trough, no call back since, need to move time for  IV Vancomycin administration.

## 2019-08-31 LAB — GLUCOSE, CAPILLARY
Glucose-Capillary: 121 mg/dL — ABNORMAL HIGH (ref 70–99)
Glucose-Capillary: 142 mg/dL — ABNORMAL HIGH (ref 70–99)
Glucose-Capillary: 173 mg/dL — ABNORMAL HIGH (ref 70–99)
Glucose-Capillary: 145 mg/dL — ABNORMAL HIGH (ref 70–99)

## 2019-08-31 MED ORDER — HYDROMORPHONE HCL 1 MG/ML IJ SOLN
2.0000 mg | Freq: Two times a day (BID) | INTRAMUSCULAR | Status: DC | PRN
  Administered 2019-08-31: 12:00:00 2 mg via INTRAVENOUS
  Filled 2019-08-31: qty 2

## 2019-08-31 MED ORDER — HYDROMORPHONE HCL 1 MG/ML IJ SOLN
2.0000 mg | Freq: Two times a day (BID) | INTRAMUSCULAR | Status: DC | PRN
  Administered 2019-08-31 – 2019-09-02 (×5): 2 mg via INTRAVENOUS
  Filled 2019-08-31 (×5): qty 2

## 2019-08-31 NOTE — Progress Notes (Signed)
Occupational Therapy Treatment Patient Details Name: Vernon Frye MRN: [id number] DOB: 1961-06-01 Today's Date: 08/31/2019    History of present illness Pt is a 59 y/o M PMH Afib, HTN, L TKR (2016) who presented to ED with Lt knee pain. 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 Desaturated with hypoxemic respiratory failure requiring 3LNC. Pt underwent L TKA removal with placement of antibiotic spacers on 12/29, agitated after procedure and unable to be extubated. Extubated 07/31/19.   OT comments  Pt making steady progress toward OT goals this session, focused on OOB BADL with standing. Pt completed bed mobility with min A for LLE management. He was able to complete lateral scoot with drop arm to w/c with min guard assist. Sit <> stands then practiced with min A and RW. Pt completed step through with LLE TTWB followed appropriately. Educated pt on use of leg lifter for gastrocnemius stretching and transfers. Pt left up in wheelchair with leg elevated. D/c recs remain appropriate. Will continue to follow.   Follow Up Recommendations  SNF;Supervision/Assistance - 24 hour    Equipment Recommendations  3 in 1 bedside commode;Wheelchair (measurements OT);Wheelchair cushion (measurements OT)    Recommendations for Other Services      Precautions / Restrictions Precautions Precautions: Fall Required Braces or Orthoses: Knee Immobilizer - Left Knee Immobilizer - Left: On when out of bed or walking Restrictions Weight Bearing Restrictions: Yes RUE Weight Bearing: Weight bearing as tolerated LUE Weight Bearing: Weight bearing as tolerated LLE Weight Bearing: Touchdown weight bearing Other Position/Activity Restrictions: No L knee ROM       Mobility Bed Mobility Overal bed mobility: Needs Assistance Bed Mobility: Supine to Sit     Supine to sit: Min assist Sit to supine: (HOB raised <20*)   General bed  mobility comments: pt needed assist moving L LE to EOB, pt came up from slightly raised HOB without assist  Transfers Overall transfer level: Needs assistance Equipment used: Rolling walker (2 wheeled) Transfers: Lateral/Scoot Transfers;Sit to/from [employer] Sit to Stand: Min assist;+2 physical assistance   Squat pivot transfers: Min guard    Lateral/Scoot Transfers: Min guard General transfer comment: pt now able to clear is buttock to squat pivot short distance and scoot pivot between LEVEL surfaces without a sliding board.  Pt able to stand up from w/c using arm rests and boost assist to attain full upright position without more than TDWB on the L LE.    Balance Overall balance assessment: Needs assistance Sitting-balance support: Feet supported Sitting balance-Leahy Scale: Good     Standing balance support: Bilateral upper extremity supported;During functional activity Standing balance-Leahy Scale: Poor Standing balance comment: maintained stability on the RW at TDWB on the L LE, heavy reliance on external support                           ADL either performed or assessed with clinical judgement   ADL Overall ADL's : Needs assistance/impaired     Grooming: Minimal assistance;Standing Grooming Details (indicate cue type and reason): to stand at sink, fatigues easily             Lower Body Dressing: Total assistance;Sitting/lateral leans   Toilet Transfer: Minimal assistance;+2 for physical assistance;+2 for safety/equipment;Stand-pivot Toilet Transfer Details (indicate cue type and reason): pt completed stand pivot with RW to wheelchair brought into room. Able to tolerate X3 sit <> stands  this date         Functional mobility during ADLs: Minimal assistance;+2 for physical assistance;+2 for safety/equipment;Rolling walker General ADL Comments: fouced on OOB ADL for increased endurance     Vision Patient Visual Report: No change from  baseline     Perception     Praxis      Cognition Arousal/Alertness: Awake/alert Behavior During Therapy: WFL for tasks assessed/performed Overall Cognitive Status: Within Functional Limits for tasks assessed                                 General Comments: noted no outward cognitive deficits this date from previous sessions noted. Pt more pleasant, following commands and able to recall surroundings of his condition        Exercises Exercises: Other exercises Other Exercises Other Exercises: R LE hip/knee AROM with resisted gross extension,  AA SLR on L LE in Knee immobilizer.   Shoulder Instructions       General Comments vss    Pertinent Vitals/ Pain       Pain Assessment: Faces Faces Pain Scale: Hurts little more Pain Location: L knee Pain Descriptors / Indicators: Guarding;Discomfort;Grimacing Pain Intervention(s): Premedicated before session;Monitored during session;Limited activity within patient's tolerance  Home Living                                          Prior Functioning/Environment              Frequency  Min 2X/week        Progress Toward Goals  OT Goals(current goals can now be found in the care plan section)  Progress towards OT goals: Progressing toward goals  Acute Rehab OT Goals Patient Stated Goal: get stronger OT Goal Formulation: With patient Time For Goal Achievement: 09/13/19 Potential to Achieve Goals: Good  Plan Discharge plan remains appropriate    Co-evaluation    PT/OT/SLP Co-Evaluation/Treatment: Yes Reason for Co-Treatment: For patient/therapist safety;To address functional/ADL transfers PT goals addressed during session: Mobility/safety with mobility OT goals addressed during session: ADL's and self-care;Strengthening/ROM;Proper use of Adaptive equipment and DME      AM-PAC OT "6 Clicks" Daily Activity     Outcome Measure   Help from another person eating meals?: None Help  from another person taking care of personal grooming?: A Little Help from another person toileting, which includes using toliet, bedpan, or urinal?: A Lot Help from another person bathing (including washing, rinsing, drying)?: A Lot Help from another person to put on and taking off regular upper body clothing?: A Little Help from another person to put on and taking off regular lower body clothing?: A Lot 6 Click Score: 16    End of Session Equipment Utilized During Treatment: Left knee immobilizer  OT Visit Diagnosis: Muscle weakness (generalized) (M62.81);Pain Pain - Right/Left: Left Pain - part of body: Knee;Leg   Activity Tolerance Patient tolerated treatment well   Patient Left in chair;with call bell/phone within reach;with chair alarm set;Other (comment)(in wheelchair)   Nurse Communication Mobility status;Other (comment)(that pt in wheelchair with elevated foot rest; edu given on using w/c)        Time: [id number] OT Time Calculation (min): 39 min  Charges: OT General Charges $OT Visit: 1 Visit OT Treatments $Self Care/Home Management : 8-22 mins  Zenovia Jarred, MSOT, OTR/L Acute Rehabilitation  Services [facility] Office Number: [phone]  Zenovia Jarred 08/31/2019, 5:46 PM

## 2019-08-31 NOTE — Progress Notes (Signed)
PROGRESS NOTE    Vernon Frye  [id number] DOB: October 10, 1960 DOA: 07/12/2019 PCP: Nicholes Rough, PA-C   Brief Narrative: 59 year old who presents with the left knee and swelling.  Found to have septic arthritis of the knee.  Seen by orthopedic for left total knee arthroplasty.  Require intubation.  Found to have new onset A. fib with RVR.  Seen by cardiology.  Had DCCV.  Improved.  Transfer to [facility] on 1 /6/21.  Patient developed some drowsiness related to medication earlier.  He needs to receive IV antibiotics through 09/06/2019.  Need a SNF but patient does not have insurance. He was unhappy about pain meds, says does not want Dilaudid IV after PT but rather in the evening as that is when his knee aches after doing PT.  Assessment & Plan:   Principal Problem:   MRSA bacteremia Active Problems:   Septic joint ([place])   Atrial fibrillation with rapid ventricular response (HCC)   AKI (acute kidney injury) ([place])   Hyponatremia   Severe sepsis (HCC)   Severe sepsis with acute organ dysfunction (HCC)   Respiratory insufficiency   Acute pain of left knee   Prosthetic joint infection (HCC)   Tachypnea   Acute bilateral low back pain without sciatica   Left arm swelling   Red man syndrome   Acute metabolic encephalopathy   S/P revision of total knee   Malnutrition of moderate degree   Acute respiratory failure ([place])  1-New onset A. Fib: -Patient was seen in consultation by cardiology.  -Patient underwent DCCV and noted to have an enlarged right ventricle. -Patient currently on amiodarone and metoprolol for rate control. -Continue with Eliquis. -Stable.   2-MRSA bacteremia/left knee septic joint Repeated blood cultures from 07/30/2019 - -TEE done negative for any vegetation. -Continue with IV vancomycin through 09/06/2019 -Patient need a skilled nursing facility although he does not have insurance will likely need to complete antibiotics in-house -Will change morphine to dilaudid,  will make available BID PRN. -he has been able to work more with PT. Hopefully mobility improve to the point he wont require SNF. He will complete antibiotics 09-06-2019  3-Right knee pain Effusion;  Patient decline arthrocentesis.  He wants medical management.  Continue with  voltaren gel. Ibuprofen for 2 more days./  Pain improved.   Hypertension: Continue with Toprol-XL.  Acute metabolic encephalopathy Improved clinically. Continue with home regimen of Xanax 1 mg nightly.  Polysubstance substance use/history of alcohol use Need to follow-up with his pain management physician  Lower Extremity edema: Doppler of lower extremity: Negative for DVT Improved with Ted hose and elevation.  Received one time dose lasix.   Anemia: Iron deficiency, B 12 [id number], Folic acid 26, iron 36, ferritin 219. Started iron supplement.     Nutrition Problem: Moderate Malnutrition Etiology: social / environmental circumstances    Signs/Symptoms: percent weight loss, mild muscle depletion    Interventions: Tube feeding, Prostat  Estimated body mass index is 30.09 kg/m as calculated from the following:   Height as of this encounter: 6\' 3"  (1.905 m).   Weight as of this encounter: 109.2 kg.   DVT prophylaxis: Eliquis Code Status: Full code Family Communication: Care discussed with patient Disposition Plan:  -Need to be discharged to skilled nursing facility.  Likely to remain in-house until he finished IV antibiotics completed 09/06/2019. -If patient improve physically and is not able to require SNF placement could discussed with ID others options for antibiotics. Patient encourage to work with PT.  Consultants:   Orthopedics: [name] 07/13/2019  Cardiology: Dr. Daneen Schick 07/13/2019   Procedures:   CT head 07/24/2019  CT left knee 07/23/2019  PICC line placement  Renal ultrasound 07/13/2019  MRI L-spine 07/18/2019  2D echo 07/16/2019  TEE 08/02/2019  Left lower  extremity Doppler 07/29/2019, 07/13/2019  Upper extremity Dopplers 07/29/2019, 07/21/2019  Left knee aspiration at bedside 07/13/2019 per [name]  Irrigation and debridement left total knee with polyexchange per [name] 07/13/2019   Antimicrobials:  IV vancomycin 07/25/2019>>>>>> 09/06/2019  Subjective: Frustrated that pillows have not been changed, and wants to discuss pain meds.  Objective: Vitals:   08/30/19 0348 08/30/19 1500 08/30/19 2110 08/31/19 0315  BP: 130/78 124/80 124/84 (!) 144/85  Pulse: 68 70 68 70  Resp: 19 18 18 18   Temp: 98.2 F (36.8 C) 99 F (37.2 C) 98.7 F (37.1 C) 97.8 F (36.6 C)  TempSrc: Oral Oral    SpO2: 100% 99% 100% 100%  Weight:      Height:        Intake/Output Summary (Last 24 hours) at 08/31/2019 1047 Last data filed at 08/31/2019 0745 Gross per 24 hour  Intake 1470 ml  Output 1840 ml  Net -370 ml   Filed Weights   08/21/19 0544 08/24/19 0500 08/26/19 0500  Weight: 108 kg 108.5 kg 109.2 kg   Examination: General exam: NAD Respiratory system: CTA Cardiovascular system: S 1, S 2 RRR Gastrointestinal : system; BS present, soft, nt Central nervous system: Alert and oriented Extremities:  dressing left Knee, less LE edema   Data Reviewed: I have personally reviewed following labs and imaging studies  CBC: Recent Labs  Lab 08/27/19 0516  WBC 9.1  HGB 9.2*  HCT 30.5*  MCV 91.0  PLT [id number]   Basic Metabolic Panel: Recent Labs  Lab 08/27/19 0516 08/28/19 1003  NA 138 138  K 4.2 3.9  CL 97* 98  CO2 31 29  GLUCOSE 153* 168*  BUN 14 18  CREATININE 0.79 0.75  CALCIUM 9.3 9.4   GFR: Estimated Creatinine Clearance: 134.4 mL/min (by C-G formula based on SCr of 0.75 mg/dL). Liver Function Tests: No results for input(s): AST, ALT, ALKPHOS, BILITOT, PROT, ALBUMIN in the last 168 hours. No results for input(s): LIPASE, AMYLASE in the last 168 hours. No results for input(s): AMMONIA in the last 168 hours. Coagulation Profile: No  results for input(s): INR, PROTIME in the last 168 hours. Cardiac Enzymes: No results for input(s): CKTOTAL, CKMB, CKMBINDEX, TROPONINI in the last 168 hours. BNP (last 3 results) No results for input(s): PROBNP in the last 8760 hours. HbA1C: No results for input(s): HGBA1C in the last 72 hours. CBG: Recent Labs  Lab 08/30/19 0804 08/30/19 1128 08/30/19 1744 08/30/19 2106 08/31/19 0748  GLUCAP 95 115* 95 155* 121*   Lipid Profile: No results for input(s): CHOL, HDL, LDLCALC, TRIG, CHOLHDL, LDLDIRECT in the last 72 hours. Thyroid Function Tests: No results for input(s): TSH, T4TOTAL, FREET4, T3FREE, THYROIDAB in the last 72 hours. Anemia Panel: No results for input(s): VITAMINB12, FOLATE, FERRITIN, TIBC, IRON, RETICCTPCT in the last 72 hours. Sepsis Labs: No results for input(s): PROCALCITON, LATICACIDVEN in the last 168 hours.  No results found for this or any previous visit (from the past 240 hour(s)).   Radiology Studies: No results found.  Scheduled Meds: . acetaminophen  650 mg Oral Q6H  . amiodarone  200 mg Oral Daily  . apixaban  5 mg Oral BID  . chlorhexidine  15 mL Mouth Rinse BID  . Chlorhexidine Gluconate Cloth  6 each Topical Daily  . diclofenac Sodium  2 g Topical QID  . docusate sodium  100 mg Oral BID  . feeding supplement (ENSURE ENLIVE)  237 mL Oral BID BM  . ferrous sulfate  325 mg Oral BID WC  . folic acid  1 mg Oral Daily  . gabapentin  300 mg Oral TID  . ibuprofen  400 mg Oral BID  . insulin aspart  0-6 Units Subcutaneous TID AC & HS  . magnesium oxide  400 mg Oral BID  . metoprolol succinate  200 mg Oral Daily  . morphine  15 mg Oral Q12H  . multivitamin with minerals  1 tablet Oral Daily  . pantoprazole  40 mg Oral Daily  . psyllium  1 packet Oral Daily  . QUEtiapine  25 mg Oral BID  . sodium chloride flush  10-40 mL Intracatheter Q12H  . thiamine  100 mg Oral Daily   Continuous Infusions: . vancomycin 1,250 mg (08/31/19 0756)     LOS:  49 days   Time spent: 15 minutes  Valerya Maxton Marry Guan, MD [facility]  If 7PM-7AM, please contact night-coverage  08/31/2019, 10:47 AM

## 2019-08-31 NOTE — Plan of Care (Signed)

## 2019-08-31 NOTE — Progress Notes (Signed)
Pt is complaining of pain in left leg. States that the oral medication isn't relieving his pain and would like something stronger. Paged MD on call. Waiting on response. Will continue to monitor.

## 2019-08-31 NOTE — Progress Notes (Signed)
Physical Therapy Treatment Patient Details Name: Vernon Frye MRN: [id number] DOB: 01-25-1961 Today's Date: 08/31/2019    History of Present Illness Vernon is a 59 y/o M PMH Afib, HTN, L TKR (2016) who presented to ED with Lt knee pain. 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 Desaturated with hypoxemic respiratory failure requiring 3LNC. Vernon underwent L TKA removal with placement of antibiotic spacers on 12/29, agitated after procedure and unable to be extubated. Extubated 07/31/19.    Vernon Comments    Vernon participated well today, accepting what needed to be done to progress.  Less assist needed overall than anticipated.   Emphasis on transitions to EOB, scoot/squat transfer, sit to stand with use of armrest and "swing to" walking in the RW, TDWB on the L LE.  Follow Up Recommendations  SNF;Supervision/Assistance - 24 hour     Equipment Recommendations  Other (comment)(TBA)    Recommendations for Other Services       Precautions / Restrictions Precautions Precautions: Fall Required Braces or Orthoses: Knee Immobilizer - Left Knee Immobilizer - Left: On when out of bed or walking Restrictions Weight Bearing Restrictions: Yes RUE Weight Bearing: Weight bearing as tolerated LUE Weight Bearing: Weight bearing as tolerated LLE Weight Bearing: Touchdown weight bearing Other Position/Activity Restrictions: No L knee ROM    Mobility  Bed Mobility Overal bed mobility: Needs Assistance Bed Mobility: Supine to Sit     Supine to sit: Min assist Sit to supine: (HOB raised <20*)   General bed mobility comments: Vernon needed assist moving L LE to EOB, Vernon came up from slightly raised HOB without assist  Transfers Overall transfer level: Needs assistance Equipment used: Rolling walker (2 wheeled) Transfers: Lateral/Scoot Transfers;Sit to/from [employer] Sit to Stand: Min assist;+2 physical assistance    Squat pivot transfers: Min guard    Lateral/Scoot Transfers: Min guard General transfer comment: Vernon now able to clear is buttock to squat pivot short distance and scoot pivot between LEVEL surfaces without a sliding board.  Vernon able to stand up from w/c using arm rests and boost assist to attain full upright position without more than TDWB on the L LE.  Ambulation/Gait Ambulation/Gait assistance: Min assist;+2 safety/equipment Gait Distance (Feet): 8 Feet Assistive device: Rolling walker (2 wheeled) Gait Pattern/deviations: Step-to pattern     General Gait Details: Vernon able to maintain TDWB on the L LE completing "swing to" pattern in the RW with stability assist a repetitive cues to maintain TDWB on the L LE.   Stairs             Wheelchair Mobility    Modified Rankin (Stroke Patients Only)       Balance Overall balance assessment: Needs assistance Sitting-balance support: Feet supported Sitting balance-Leahy Scale: Good     Standing balance support: Bilateral upper extremity supported;During functional activity Standing balance-Leahy Scale: Poor Standing balance comment: maintained stability on the RW at TDWB on the L LE                             Cognition Arousal/Alertness: Awake/alert Behavior During Therapy: WFL for tasks assessed/performed Overall Cognitive Status: Within Functional Limits for tasks assessed  Exercises Other Exercises Other Exercises: R LE hip/knee AROM with resisted gross extension,  AA SLR on L LE in Knee immobilizer.    General Comments General comments (skin integrity, edema, etc.): vss      Pertinent Vitals/Pain Pain Assessment: Faces Faces Pain Scale: Hurts little more Pain Location: L knee Pain Descriptors / Indicators: Guarding;Discomfort;Grimacing Pain Intervention(s): Premedicated before session;Monitored during session    Home Living                       Prior Function            Vernon Goals (current goals can now be found in the care plan section) Acute Rehab Vernon Goals Patient Stated Goal: get stronger Vernon Goal Formulation: With patient Time For Goal Achievement: 09/07/19(continue goals) Potential to Achieve Goals: Fair Progress towards Vernon goals: Progressing toward goals    Frequency    Min 3X/week      Vernon Plan Current plan remains appropriate    Co-evaluation   Reason for Co-Treatment: To address functional/ADL transfers Vernon goals addressed during session: Mobility/safety with mobility OT goals addressed during session: ADL's and self-care      AM-PAC Vernon "6 Clicks" Mobility   Outcome Measure  Help needed turning from your back to your side while in a flat bed without using bedrails?: A Little Help needed moving from lying on your back to sitting on the side of a flat bed without using bedrails?: A Lot Help needed moving to and from a bed to a chair (including a wheelchair)?: A Little Help needed standing up from a chair using your arms (e.g., wheelchair or bedside chair)?: A Little Help needed to walk in hospital room?: A Little Help needed climbing 3-5 steps with a railing? : Total 6 Click Score: 15    End of Session Equipment Utilized During Treatment: Left knee immobilizer Activity Tolerance: Patient tolerated treatment well Patient left: in chair;with call bell/phone within reach Nurse Communication: Mobility status;Precautions;Weight bearing status Vernon Visit Diagnosis: Other abnormalities of gait and mobility (R26.89);Pain;Muscle weakness (generalized) (M62.81) Pain - Right/Left: Left Pain - part of body: Knee;Leg     Time: [id number] Vernon Time Calculation (min) (ACUTE ONLY): 39 min  Charges:  $Gait Training: 8-22 mins $Therapeutic Activity: 8-22 mins                     08/31/2019  Vernon Carne., Vernon Frye  (pager) [phone]  (office)   Vernon Frye  Vernon Frye 08/31/2019, 5:31 PM

## 2019-09-01 LAB — BASIC METABOLIC PANEL
Anion gap: 11 (ref 5–15)
BUN: 21 mg/dL — ABNORMAL HIGH (ref 6–20)
CO2: 29 mmol/L (ref 22–32)
Calcium: 9.2 mg/dL (ref 8.9–10.3)
Chloride: 99 mmol/L (ref 98–111)
Creatinine, Ser: 0.7 mg/dL (ref 0.61–1.24)
GFR calc Af Amer: >60 mL/min (ref >60)
GFR calc non Af Amer: >60 mL/min (ref >60)
Glucose, Bld: 148 mg/dL — ABNORMAL HIGH (ref 70–99)
Potassium: 4.2 mmol/L (ref 3.5–5.1)
Sodium: 139 mmol/L (ref 135–145)

## 2019-09-01 LAB — GLUCOSE, CAPILLARY
Glucose-Capillary: 150 mg/dL — ABNORMAL HIGH (ref 70–99)
Glucose-Capillary: 147 mg/dL — ABNORMAL HIGH (ref 70–99)
Glucose-Capillary: 159 mg/dL — ABNORMAL HIGH (ref 70–99)
Glucose-Capillary: 150 mg/dL — ABNORMAL HIGH (ref 70–99)

## 2019-09-01 LAB — CBC
HCT: 30.3 % — ABNORMAL LOW (ref 39.0–52.0)
Hemoglobin: 9.1 g/dL — ABNORMAL LOW (ref 13.0–17.0)
MCH: 27.3 pg (ref 26.0–34.0)
MCHC: 30 g/dL (ref 30.0–36.0)
MCV: 91 fL (ref 80.0–100.0)
Platelets: 308 10*3/uL (ref 150–400)
RBC: 3.33 MIL/uL — ABNORMAL LOW (ref 4.22–5.81)
RDW: 14.6 % (ref 11.5–15.5)
WBC: 7.9 10*3/uL (ref 4.0–10.5)
nRBC: 0 % (ref 0.0–0.2)

## 2019-09-01 NOTE — Progress Notes (Signed)
PROGRESS NOTE    Vernon Frye  [id number] DOB: 1960-12-10 DOA: 07/12/2019 PCP: Nicholes Rough, PA-C   Brief Narrative: 59 year old who presents with the left knee and swelling.  Found to have septic arthritis of the knee.  Seen by orthopedic for left total knee arthroplasty.  Require intubation.  Found to have new onset A. fib with RVR.  Seen by cardiology.  Had DCCV.  Improved.  Transfer to [facility] on 1 /6/21.  Patient developed some drowsiness related to medication earlier.  He needs to receive IV antibiotics through 09/06/2019.  Need a SNF but patient does not have insurance. Have adjusted pain medications, such that he can can IV dilaudid twice daily PRN. Otherwise no changes.  Assessment & Plan:   Principal Problem:   MRSA bacteremia Active Problems:   Septic joint ([place])   Atrial fibrillation with rapid ventricular response (HCC)   AKI (acute kidney injury) ([place])   Hyponatremia   Severe sepsis (HCC)   Severe sepsis with acute organ dysfunction (HCC)   Respiratory insufficiency   Acute pain of left knee   Prosthetic joint infection (HCC)   Tachypnea   Acute bilateral low back pain without sciatica   Left arm swelling   Red man syndrome   Acute metabolic encephalopathy   S/P revision of total knee   Malnutrition of moderate degree   Acute respiratory failure ([employer])  1-New onset A. Fib: -Patient was seen in consultation by cardiology.  -Patient underwent DCCV and noted to have an enlarged right ventricle. -Patient currently on amiodarone and metoprolol for rate control. -Continue with Eliquis. -Stable.   2-MRSA bacteremia/left knee septic joint Repeated blood cultures from 07/30/2019 - -TEE done negative for any vegetation. -Continue with IV vancomycin through 09/06/2019 -Patient need a skilled nursing facility although he does not have insurance will likely need to complete antibiotics in-house -Will change morphine to dilaudid, will make available BID PRN. -he has been  able to work more with PT. Hopefully mobility improve to the point he wont require SNF. He will complete antibiotics 09-06-2019  3-Right knee pain Effusion;  Patient decline arthrocentesis.  He wants medical management.  Continue with  voltaren gel. Ibuprofen for 2 more days./  Pain improved.   Hypertension: Continue with Toprol-XL.  Acute metabolic encephalopathy Improved clinically. Continue with home regimen of Xanax 1 mg nightly.  Polysubstance substance use/history of alcohol use Need to follow-up with his pain management physician  Lower Extremity edema: Doppler of lower extremity: Negative for DVT Improved with Ted hose and elevation.  Received one time dose lasix.   Anemia: Iron deficiency, B 12 [id number], Folic acid 26, iron 36, ferritin 219. Started iron supplement.   Nutrition Problem: Moderate Malnutrition Etiology: social / environmental circumstances  Signs/Symptoms: percent weight loss, mild muscle depletion  Interventions: Tube feeding, Prostat  Estimated body mass index is 30.64 kg/m as calculated from the following:   Height as of this encounter: 6\' 3"  (1.905 m).   Weight as of this encounter: 111.2 kg.  DVT prophylaxis: Eliquis Code Status: Full code Family Communication:  None present Disposition Plan:  -Need to be discharged to skilled nursing facility.  Likely to remain in-house until he finished IV antibiotics completed 09/06/2019. -If patient improve physically and is not able to require SNF placement could discussed with ID others options for antibiotics. Patient encourage to work with PT.   Consultants:   Orthopedics: [name] 07/13/2019  Cardiology: Dr. Daneen Schick 07/13/2019  Procedures:   CT head 07/24/2019  CT left knee 07/23/2019  PICC line placement  Renal ultrasound 07/13/2019  MRI L-spine 07/18/2019  2D echo 07/16/2019  TEE 08/02/2019  Left lower extremity Doppler 07/29/2019, 07/13/2019  Upper extremity Dopplers 07/29/2019,  07/21/2019  Left knee aspiration at bedside 07/13/2019 per [name]  Irrigation and debridement left total knee with polyexchange per [name] 07/13/2019  Antimicrobials:  IV vancomycin 07/25/2019>>>>>> 09/06/2019  Subjective: He is happier today, says was able to use wheelchair and ambulate with walked yesterday, no complaints.  Objective: Vitals:   08/31/19 1500 08/31/19 2224 09/01/19 0500 09/01/19 0632  BP: (!) 140/92 125/79  136/84  Pulse: 88 76  84  Resp: 18 18  18   Temp: 98.1 F (36.7 C) 98.2 F (36.8 C)  98.6 F (37 C)  TempSrc: Oral Oral  Oral  SpO2: 100% 98%  100%  Weight:   111.2 kg   Height:        Intake/Output Summary (Last 24 hours) at 09/01/2019 1035 Last data filed at 09/01/2019 0911 Gross per 24 hour  Intake 2651 ml  Output 2275 ml  Net 376 ml   Filed Weights   08/24/19 0500 08/26/19 0500 09/01/19 0500  Weight: 108.5 kg 109.2 kg 111.2 kg   Examination: General exam: NAD Respiratory system: CTA Cardiovascular system: S 1, S 2 RRR Gastrointestinal : system; BS present, soft, nt Central nervous system: Alert and oriented Extremities:  dressing left Knee, less LE edema   Data Reviewed: I have personally reviewed following labs and imaging studies  CBC: Recent Labs  Lab 08/27/19 0516 09/01/19 0416  WBC 9.1 7.9  HGB 9.2* 9.1*  HCT 30.5* 30.3*  MCV 91.0 91.0  PLT 326 [id number]   Basic Metabolic Panel: Recent Labs  Lab 08/27/19 0516 08/28/19 1003 09/01/19 0416  NA 138 138 139  K 4.2 3.9 4.2  CL 97* 98 99  CO2 31 29 29   GLUCOSE 153* 168* 148*  BUN 14 18 21*  CREATININE 0.79 0.75 0.70  CALCIUM 9.3 9.4 9.2   GFR: Estimated Creatinine Clearance: 135.5 mL/min (by C-G formula based on SCr of 0.7 mg/dL). Liver Function Tests: No results for input(s): AST, ALT, ALKPHOS, BILITOT, PROT, ALBUMIN in the last 168 hours. No results for input(s): LIPASE, AMYLASE in the last 168 hours. No results for input(s): AMMONIA in the last 168 hours. Coagulation  Profile: No results for input(s): INR, PROTIME in the last 168 hours. Cardiac Enzymes: No results for input(s): CKTOTAL, CKMB, CKMBINDEX, TROPONINI in the last 168 hours. BNP (last 3 results) No results for input(s): PROBNP in the last 8760 hours. HbA1C: No results for input(s): HGBA1C in the last 72 hours. CBG: Recent Labs  Lab 08/31/19 0748 08/31/19 1209 08/31/19 1617 08/31/19 2223 09/01/19 0806  GLUCAP 121* 142* 173* 145* 150*   Lipid Profile: No results for input(s): CHOL, HDL, LDLCALC, TRIG, CHOLHDL, LDLDIRECT in the last 72 hours. Thyroid Function Tests: No results for input(s): TSH, T4TOTAL, FREET4, T3FREE, THYROIDAB in the last 72 hours. Anemia Panel: No results for input(s): VITAMINB12, FOLATE, FERRITIN, TIBC, IRON, RETICCTPCT in the last 72 hours. Sepsis Labs: No results for input(s): PROCALCITON, LATICACIDVEN in the last 168 hours.  No results found for this or any previous visit (from the past 240 hour(s)).   Radiology Studies: No results found.  Scheduled Meds: . acetaminophen  650 mg Oral Q6H  . amiodarone  200 mg Oral Daily  . apixaban  5 mg Oral BID  . chlorhexidine  15 mL Mouth Rinse  BID  . Chlorhexidine Gluconate Cloth  6 each Topical Daily  . diclofenac Sodium  2 g Topical QID  . docusate sodium  100 mg Oral BID  . feeding supplement (ENSURE ENLIVE)  237 mL Oral BID BM  . ferrous sulfate  325 mg Oral BID WC  . folic acid  1 mg Oral Daily  . gabapentin  300 mg Oral TID  . ibuprofen  400 mg Oral BID  . insulin aspart  0-6 Units Subcutaneous TID AC & HS  . magnesium oxide  400 mg Oral BID  . metoprolol succinate  200 mg Oral Daily  . morphine  15 mg Oral Q12H  . multivitamin with minerals  1 tablet Oral Daily  . pantoprazole  40 mg Oral Daily  . psyllium  1 packet Oral Daily  . QUEtiapine  25 mg Oral BID  . sodium chloride flush  10-40 mL Intracatheter Q12H  . thiamine  100 mg Oral Daily   Continuous Infusions: . vancomycin 1,250 mg (09/01/19  0827)     LOS: 50 days   Time spent: 15 minutes  Shakedra Beam Marry Guan, MD [facility]  If 7PM-7AM, please contact night-coverage  09/01/2019, 10:35 AM

## 2019-09-01 NOTE — Plan of Care (Signed)
  Problem: Education: Goal: Knowledge of General Education information will improve Description: Including pain rating scale, medication(s)/side effects and non-pharmacologic comfort measures Outcome: Progressing   Problem: Health Behavior/Discharge Planning: Goal: Ability to manage health-related needs will improve Outcome: Progressing   Problem: Clinical Measurements: Goal: Ability to maintain clinical measurements within normal limits will improve Outcome: Progressing Goal: Will remain free from infection Outcome: Progressing   Problem: Activity: Goal: Risk for activity intolerance will decrease Outcome: Progressing   Problem: Nutrition: Goal: Adequate nutrition will be maintained Outcome: Progressing   Problem: Elimination: Goal: Will not experience complications related to bowel motility Outcome: Progressing Goal: Will not experience complications related to urinary retention Outcome: Progressing   Problem: Pain Managment: Goal: General experience of comfort will improve Outcome: Progressing   Problem: Safety: Goal: Ability to remain free from injury will improve Outcome: Progressing   Problem: Skin Integrity: Goal: Risk for impaired skin integrity will decrease Outcome: Progressing

## 2019-09-02 LAB — GLUCOSE, CAPILLARY
Glucose-Capillary: 156 mg/dL — ABNORMAL HIGH (ref 70–99)
Glucose-Capillary: 155 mg/dL — ABNORMAL HIGH (ref 70–99)
Glucose-Capillary: 197 mg/dL — ABNORMAL HIGH (ref 70–99)
Glucose-Capillary: 106 mg/dL — ABNORMAL HIGH (ref 70–99)

## 2019-09-02 MED ORDER — HYDROMORPHONE HCL 1 MG/ML IJ SOLN
2.0000 mg | Freq: Once | INTRAMUSCULAR | Status: AC
  Administered 2019-09-02: 2 mg via INTRAVENOUS
  Filled 2019-09-02: qty 2

## 2019-09-02 NOTE — Progress Notes (Signed)
Pharmacy Antibiotic Note  Vernon Frye is a 59 y.o. male admitted on 07/12/2019 with MRSA bacteremia and septic knee joint infection.  Pharmacy has been consulted for vancomycin dosing. Pt is on extended infusion time (2 hours) to alleviate Red Man Syndrome; no adverse effects reported since this was begun.   Calculated vancomycin AUC, using the last levels, was 493.2, which is within the goal vancomycin AUC range of 400-550  WBC 7.9, afebrile; Scr 0.7, CrCl >120 ml/min (renal function stable)    Plan: Continue vancomycin 1250 mg IV Q 12 hrs (infused over 120 mins) Monitor WBC, temp, renal function (at least twice weekly),  ID planning vancomycin continuing through 09/06/19 (6 weeks from removal of prosthetic material on 07/26/19)  Height: 6\' 3"  (190.5 cm) Weight: 243 lb 2.7 oz (110.3 kg) IBW/kg (Calculated) : 84.5  Temp (24hrs), Avg:98.3 F (36.8 C), Min:98.2 F (36.8 C), Max:98.6 F (37 C)  Recent Labs  Lab 08/26/19 2130 08/27/19 0516 08/27/19 0516 08/28/19 1003 08/29/19 1732 08/30/19 1440 08/30/19 1922 09/01/19 0416  WBC  --  9.1  --   --   --   --   --  7.9  CREATININE  --  0.79  --  0.75  --   --   --  0.70  VANCOTROUGH  --  12*   < >  --  17  --  15  --   VANCOPEAK 22*  --   --   --   --  19*  --   --    < > = values in this interval not displayed.    Estimated Creatinine Clearance: 135 mL/min (by C-G formula based on SCr of 0.7 mg/dL).    Allergies  Allergen Reactions  . Vancomycin Shortness Of Breath and Rash    [name]     Antimicrobials this admission: Vanc 12/22>>(09/06/19) Ceftriaxone 12/16 x1 Zosyn 12/16 x1  Dapto 12/16>>12/22  Microbiology results: 12/16 BCx 2/2 MRSA 12/16L knee fluid: abundant MRSA 12/16 Ucx: neg 12/16 Grp A strep - neg 12/17 BCx >>1/2 SA 12/18 BCx: NG/final 1/2 Cdiff >> neg 1/2 UCx >>  >100,000 colonies/mL Klebsiella oxytoca (no tx, asymptomatic) 1/2 BCx: NG/final  Moselle Rister A. Levada Dy, PharmD, BCPS, FNKF Clinical  Pharmacist [place] Please utilize Amion for appropriate phone number to reach the unit pharmacist ([place])

## 2019-09-02 NOTE — Progress Notes (Signed)
Physical Therapy Treatment Patient Details Name: KI AKERMAN MRN: [id number] DOB: 1960-11-15 Today's Date: 09/02/2019    History of Present Illness Pt is a 59 y/o M PMH Afib, HTN, L TKR (2016) who presented to ED with Lt knee pain. 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 Desaturated with hypoxemic respiratory failure requiring 3LNC. Pt underwent L TKA removal with placement of antibiotic spacers on 12/29, agitated after procedure and unable to be extubated. Extubated 07/31/19.    PT Comments    Patient received in bed, slightly disoriented to time, situation. Agrees to PT/OT session. Patient reports 9/10 pain in left knee, stating medications has already worn off. Requires min assist to perform supine to sit. Declines standing and waling this visit. States he is not doing that again. Agrees to transfer to wheelchair. Required min guard to perform safely. Assistance to position wheelchair and get set up. Patient encouraged to perform heel cord stretches as his heel cords are tight. Used gait belt to assist with this. Patient will continue to benefit from skilled PT to improve strength and functional mobility to improve safety and independence.      Follow Up Recommendations  SNF;Supervision/Assistance - 24 hour     Equipment Recommendations  Other (comment)(TBD, would benefit from wheelchair but cannot afford. Not sure if we can assist with getting him one.)    Recommendations for Other Services       Precautions / Restrictions Precautions Precautions: Fall Required Braces or Orthoses: Knee Immobilizer - Left Knee Immobilizer - Left: On at all times Restrictions Weight Bearing Restrictions: Yes RUE Weight Bearing: Weight bearing as tolerated LUE Weight Bearing: Weight bearing as tolerated LLE Weight Bearing: Touchdown weight bearing Other Position/Activity Restrictions: No L knee ROM    Mobility  Bed  Mobility Overal bed mobility: Needs Assistance Bed Mobility: Supine to Sit     Supine to sit: Min assist     General bed mobility comments: patient attemted to sit up on side of bed independently, but requires a lot of effort and min assist to perform due to poor balance and strength  Transfers Overall transfer level: Needs assistance   Transfers: Lateral/Scoot Transfers          Lateral/Scoot Transfers: Min guard General transfer comment: Patient is able to perform scoot to wheelchair with min guard for safety. Declined sit to stand transfer this session. Reporting pain and dizziness.  Ambulation/Gait             General Gait Details: Patient declining ambulation, reports dizziness and pain.   Stairs             Information systems manager mobility: Yes Wheelchair propulsion: Right upper extremity Wheelchair parts: Needs assistance Distance: 15 Wheelchair Assistance Details (indicate cue type and reason): patient required assistance as he was holding his right foot off the ground using his left hand and gait belt. He did not want to put leg rest on wheelchair .  Modified Rankin (Stroke Patients Only)       Balance Overall balance assessment: Needs assistance Sitting-balance support: Feet supported Sitting balance-Leahy Scale: Good       Standing balance-Leahy Scale: Poor                              Cognition Arousal/Alertness: Awake/alert Behavior During Therapy: WFL for tasks assessed/performed Overall Cognitive Status: Within Functional  Limits for tasks assessed Area of Impairment: Memory;Problem solving;Safety/judgement;Following commands;Awareness                   Current Attention Level: Sustained Memory: Decreased short-term memory Following Commands: Follows one step commands with increased time;Follows one step commands consistently;Follows multi-step commands with increased time;Follows  multi-step commands inconsistently Safety/Judgement: Decreased awareness of safety;Decreased awareness of deficits Awareness: Emergent Problem Solving: Slow processing;Requires verbal cues General Comments: Patient tends to be slightly argumentative.      Exercises Total Joint Exercises Ankle Circles/Pumps: 10 reps;Left;AAROM(using gait belt to assist)    General Comments        Pertinent Vitals/Pain Pain Assessment: 0-10 Pain Score: 8  Pain Location: L knee Pain Descriptors / Indicators: Aching;Discomfort;Sore;Guarding Pain Intervention(s): Monitored during session;Premedicated before session;Repositioned    Home Living                      Prior Function            PT Goals (current goals can now be found in the care plan section) Acute Rehab PT Goals Patient Stated Goal: get stronger PT Goal Formulation: With patient Time For Goal Achievement: 09/07/19 Potential to Achieve Goals: Fair Progress towards PT goals: Progressing toward goals    Frequency    Min 3X/week      PT Plan Current plan remains appropriate    Co-evaluation PT/OT/SLP Co-Evaluation/Treatment: Yes Reason for Co-Treatment: To address functional/ADL transfers;For patient/therapist safety PT goals addressed during session: Mobility/safety with mobility        AM-PAC PT "6 Clicks" Mobility   Outcome Measure  Help needed turning from your back to your side while in a flat bed without using bedrails?: A Little Help needed moving from lying on your back to sitting on the side of a flat bed without using bedrails?: A Lot Help needed moving to and from a bed to a chair (including a wheelchair)?: A Little Help needed standing up from a chair using your arms (e.g., wheelchair or bedside chair)?: A Little Help needed to walk in hospital room?: A Lot Help needed climbing 3-5 steps with a railing? : Total 6 Click Score: 14    End of Session Equipment Utilized During Treatment: Left knee  immobilizer Activity Tolerance: Patient limited by pain Patient left: in chair;with call bell/phone within reach Nurse Communication: Mobility status;Precautions;Weight bearing status PT Visit Diagnosis: Other abnormalities of gait and mobility (R26.89);Pain;Muscle weakness (generalized) (M62.81);Difficulty in walking, not elsewhere classified (R26.2) Pain - Right/Left: Left Pain - part of body: Knee     Time: [id number] PT Time Calculation (min) (ACUTE ONLY): 34 min  Charges:  $Therapeutic Exercise: 8-22 mins                     Keelie Zemanek, PT, GCS 09/02/19,11:11 AM

## 2019-09-02 NOTE — Progress Notes (Signed)
PROGRESS NOTE    Vernon Frye  [id number] DOB: [birth year] DOA: 07/12/2019 PCP: Nicholes Rough, PA-C   Brief Narrative: 59 year old who presents with the left knee and swelling.  Found to have septic arthritis of the knee.  Seen by orthopedic for left total knee arthroplasty.  Require intubation.    Found to have new onset A. fib with RVR, due to sepsis and bacteremia.  Seen by cardiology.  Had DCCV.  Improved.    Transfer to [facility] on 1 /6/21. Acute respiratory failure resolved, pt was successfully extubated. He's currently on RA with normal O2 sat.  Sepsis resolved. Pt has MRSA bacteremia. TEE negative for endocarditis. Repeated blood culture since 07/30/19 has remained negative. He needs to continue iv Vancomycin through 09/06/2019 per ID.   AKI and hyponatremia due to volume depletion, resolved with IVF.   Patient developed some drowsiness related to medication earlier.     Need a SNF but patient does not have insurance, so plan to discharge to home (if pain controlled and function improving) after completion of iv antibiotics. Have adjusted pain medications, such that he can can IV dilaudid twice daily PRN. Otherwise no changes.  Assessment & Plan:   Principal Problem:   Septic joint ([place]) Active Problems:   Atrial fibrillation with rapid ventricular response (HCC)   Acute pain of left knee   MRSA bacteremia   Prosthetic joint infection (HCC)   Acute bilateral low back pain without sciatica   S/P revision of total knee   Malnutrition of moderate degree  1-New onset A. Fib: -Patient was seen in consultation by cardiology.  -Patient underwent DCCV and noted to have an enlarged right ventricle. -Patient currently on amiodarone and metoprolol for rate control. -Continue with Eliquis. -Stable.   2-MRSA bacteremia/left knee septic joint Repeated blood cultures from 07/30/2019 - -TEE done negative for any vegetation. -Continue with IV vancomycin through 09/06/2019 -Patient need a  skilled nursing facility although he does not have insurance will likely need to complete antibiotics in-house -in addition to the prior pain management with iv PRN dilaudid bid, will allow one time extra iv dilaudid -he has been able to work more with PT. Hopefully mobility improve to the point he wont require SNF. He will complete antibiotics 09-06-2019  3-Right knee pain Effusion;  Patient decline arthrocentesis.  He wants medical management.  Continue with  voltaren gel. Ibuprofen for 2 more days./  Pain improved.   Hypertension: Continue with Toprol-XL.  Acute metabolic encephalopathy resolved  Polysubstance substance use/history of alcohol use Need to follow-up with his pain management physician  Lower Extremity edema: Doppler of lower extremity: Negative for DVT Improved with Ted hose and elevation.  Received one time dose lasix.   Anemia: Iron deficiency, B 12 Q000111Q, Folic acid 26, iron 36, ferritin 219. Started iron supplement.   Nutrition Problem: Moderate Malnutrition Etiology: social / environmental circumstances  Signs/Symptoms: percent weight loss, mild muscle depletion  Interventions: Tube feeding, Prostat  Estimated body mass index is 30.39 kg/m as calculated from the following:   Height as of this encounter: 6\' 3"  (1.905 m).   Weight as of this encounter: 110.3 kg.  DVT prophylaxis: Eliquis Code Status: Full code Family Communication:  None present Disposition Plan:  -Need to be discharged to skilled nursing facility.  Likely to remain in-house until he finished IV antibiotics completed 09/06/2019. -If patient improve physically and is not able to require SNF placement could discussed with ID others options for antibiotics. Patient encourage to  work with PT.   Consultants:   Orthopedics: [name] 07/13/2019  Cardiology: Dr. Daneen Schick 07/13/2019  Procedures:   CT head 07/24/2019  CT left knee 07/23/2019  PICC line placement  Renal ultrasound  07/13/2019  MRI L-spine 07/18/2019  2D echo 07/16/2019  TEE 08/02/2019  Left lower extremity Doppler 07/29/2019, 07/13/2019  Upper extremity Dopplers 07/29/2019, 07/21/2019  Left knee aspiration at bedside 07/13/2019 per [name]  Irrigation and debridement left total knee with polyexchange per [name] 07/13/2019  Antimicrobials:  IV vancomycin 07/25/2019>>>>>> 09/06/2019  Subjective: He did therapy this morning, currently in 10/10 pain and requested one time extra dose of iv dilaudid for knee and back pain control  Objective: Vitals:   09/01/19 1850 09/01/19 2157 09/02/19 0500 09/02/19 0610  BP: 132/78 124/75  112/71  Pulse: 74 74  67  Resp: 20 18  18   Temp: 98.2 F (36.8 C) 98.6 F (37 C)  98.2 F (36.8 C)  TempSrc: Oral Oral  Oral  SpO2: 100% 100%  99%  Weight:   110.3 kg   Height:        Intake/Output Summary (Last 24 hours) at 09/02/2019 1416 Last data filed at 09/02/2019 1040 Gross per 24 hour  Intake 2069.47 ml  Output 2325 ml  Net -255.53 ml   Filed Weights   08/26/19 0500 09/01/19 0500 09/02/19 0500  Weight: 109.2 kg 111.2 kg 110.3 kg   Examination: General exam: NAD Respiratory system: CTA Cardiovascular system: S 1, S 2 RRR Gastrointestinal : system; BS present, soft, nt Central nervous system: Alert and oriented Extremities:  dressing left Knee, less LE edema   Data Reviewed: I have personally reviewed following labs and imaging studies  CBC: Recent Labs  Lab 08/27/19 0516 09/01/19 0416  WBC 9.1 7.9  HGB 9.2* 9.1*  HCT 30.5* 30.3*  MCV 91.0 91.0  PLT 326 [id number]   Basic Metabolic Panel: Recent Labs  Lab 08/27/19 0516 08/28/19 1003 09/01/19 0416  NA 138 138 139  K 4.2 3.9 4.2  CL 97* 98 99  CO2 31 29 29   GLUCOSE 153* 168* 148*  BUN 14 18 21*  CREATININE 0.79 0.75 0.70  CALCIUM 9.3 9.4 9.2   GFR: Estimated Creatinine Clearance: 135 mL/min (by C-G formula based on SCr of 0.7 mg/dL). Liver Function Tests: No results for input(s): AST,  ALT, ALKPHOS, BILITOT, PROT, ALBUMIN in the last 168 hours. No results for input(s): LIPASE, AMYLASE in the last 168 hours. No results for input(s): AMMONIA in the last 168 hours. Coagulation Profile: No results for input(s): INR, PROTIME in the last 168 hours. Cardiac Enzymes: No results for input(s): CKTOTAL, CKMB, CKMBINDEX, TROPONINI in the last 168 hours. BNP (last 3 results) No results for input(s): PROBNP in the last 8760 hours. HbA1C: No results for input(s): HGBA1C in the last 72 hours. CBG: Recent Labs  Lab 09/01/19 1206 09/01/19 1726 09/01/19 2155 09/02/19 0752 09/02/19 1148  GLUCAP 147* 159* 150* 156* 155*   Lipid Profile: No results for input(s): CHOL, HDL, LDLCALC, TRIG, CHOLHDL, LDLDIRECT in the last 72 hours. Thyroid Function Tests: No results for input(s): TSH, T4TOTAL, FREET4, T3FREE, THYROIDAB in the last 72 hours. Anemia Panel: No results for input(s): VITAMINB12, FOLATE, FERRITIN, TIBC, IRON, RETICCTPCT in the last 72 hours. Sepsis Labs: No results for input(s): PROCALCITON, LATICACIDVEN in the last 168 hours.  No results found for this or any previous visit (from the past 240 hour(s)).   Radiology Studies: No results found.  Scheduled  Meds: . acetaminophen  650 mg Oral Q6H  . amiodarone  200 mg Oral Daily  . apixaban  5 mg Oral BID  . chlorhexidine  15 mL Mouth Rinse BID  . Chlorhexidine Gluconate Cloth  6 each Topical Daily  . diclofenac Sodium  2 g Topical QID  . docusate sodium  100 mg Oral BID  . feeding supplement (ENSURE ENLIVE)  237 mL Oral BID BM  . ferrous sulfate  325 mg Oral BID WC  . folic acid  1 mg Oral Daily  . gabapentin  300 mg Oral TID  . insulin aspart  0-6 Units Subcutaneous TID AC & HS  . magnesium oxide  400 mg Oral BID  . metoprolol succinate  200 mg Oral Daily  . morphine  15 mg Oral Q12H  . multivitamin with minerals  1 tablet Oral Daily  . pantoprazole  40 mg Oral Daily  . psyllium  1 packet Oral Daily  . QUEtiapine   25 mg Oral BID  . sodium chloride flush  10-40 mL Intracatheter Q12H  . thiamine  100 mg Oral Daily   Continuous Infusions: . vancomycin 125 mL/hr at 09/02/19 0648     LOS: 51 days   Time spent: 25 minutes  Paticia Stack, MD [facility]  If 7PM-7AM, please contact night-coverage  09/02/2019, 2:16 PM

## 2019-09-02 NOTE — Progress Notes (Signed)
Occupational Therapy Treatment Patient Details Name: Vernon Frye MRN: [id number] DOB: 04/22/1961 Today's Date: 09/02/2019    History of present illness Pt is a 59 y/o M PMH Afib, HTN, L TKR (2016) who presented to ED with Lt knee pain. 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 Desaturated with hypoxemic respiratory failure requiring 3LNC. Pt underwent L TKA removal with placement of antibiotic spacers on 12/29, agitated after procedure and unable to be extubated. Extubated 07/31/19.   OT comments  Pt making slow progress toward OT goals. Seemed more confused and argumentative this date. Pt completed bed mobility at min A due to difficulty maintaining sitting balance. Pt refused standing transfers this date stating he was dizzy and the pain was more than "any grown man can handle". W/c transfer completed with min guard assist for lateral scoot. Once in wheelchair pt completed oral care from seated position. Pt encouraged to stay sitting upright to help improve OOB BADL tolerance. Pt states that therapy has only come "4 times" (when he has been on therapy caseload his entire LOS) and was disoriented to time. D/c recs remain appropriate, will continue to follow.   Follow Up Recommendations  SNF;Supervision/Assistance - 24 hour    Equipment Recommendations  3 in 1 bedside commode;Wheelchair (measurements OT);Wheelchair cushion (measurements OT)    Recommendations for Other Services      Precautions / Restrictions Precautions Precautions: Fall Required Braces or Orthoses: Knee Immobilizer - Left Knee Immobilizer - Left: On at all times Restrictions Weight Bearing Restrictions: Yes RUE Weight Bearing: Weight bearing as tolerated LUE Weight Bearing: Weight bearing as tolerated LLE Weight Bearing: Touchdown weight bearing Other Position/Activity Restrictions: No L knee ROM       Mobility Bed Mobility Overal bed  mobility: Needs Assistance Bed Mobility: Supine to Sit     Supine to sit: Min assist     General bed mobility comments: patient attemted to sit up on side of bed independently, but requires a lot of effort and min assist to perform due to poor balance and strength  Transfers Overall transfer level: Needs assistance   Transfers: Lateral/Scoot Transfers          Lateral/Scoot Transfers: Min guard General transfer comment: Patient is able to perform scoot to wheelchair with min guard for safety. Declined sit to stand transfer this session. Reporting pain and dizziness.    Balance Overall balance assessment: Needs assistance Sitting-balance support: Feet supported Sitting balance-Leahy Scale: Good       Standing balance-Leahy Scale: Poor                             ADL either performed or assessed with clinical judgement   ADL Overall ADL's : Needs assistance/impaired     Grooming: Set up;Sitting Grooming Details (indicate cue type and reason): sitting in w/c at sink; deferred standing due to increased dizziness             Lower Body Dressing: Maximal assistance;Sit to/from stand Lower Body Dressing Details (indicate cue type and reason): LLE is total A, max A with RLE Toilet Transfer: Squat-pivot;BSC;Min Psychiatric nurse Details (indicate cue type and reason): able to scoot from bed to w/c with min guard assist           General ADL Comments: pt with increased dizziness and pain this date deferring standing BADL; educated on use of leg  lifter for transfers, stretching, and BADL     Vision Patient Visual Report: No change from baseline     Perception     Praxis      Cognition Arousal/Alertness: Awake/alert Behavior During Therapy: WFL for tasks assessed/performed Overall Cognitive Status: Within Functional Limits for tasks assessed Area of Impairment: Memory;Problem solving;Safety/judgement;Following commands;Awareness;Attention                    Current Attention Level: Sustained Memory: Decreased short-term memory Following Commands: Follows multi-step commands with increased time Safety/Judgement: Decreased awareness of safety;Decreased awareness of deficits Awareness: Emergent Problem Solving: Slow processing;Requires verbal cues General Comments: pt slightly argumentative, with decreased education or awarenss of topics. Seemed to have some confusion when he states "it was just the weekend and therapy did not come" when the day was Friday and therapy had been in prior that week        Exercises Total Joint Exercises Ankle Circles/Pumps: 10 reps;Left;AAROM(using gait belt to assist)   Shoulder Instructions       General Comments      Pertinent Vitals/ Pain       Pain Assessment: 0-10 Pain Score: 8  Pain Location: L knee Pain Descriptors / Indicators: Aching;Discomfort;Sore;Guarding Pain Intervention(s): Limited activity within patient's tolerance;Monitored during session;Premedicated before session;Repositioned  Home Living                                          Prior Functioning/Environment              Frequency  Min 2X/week        Progress Toward Goals  OT Goals(current goals can now be found in the care plan section)  Progress towards OT goals: Progressing toward goals  Acute Rehab OT Goals Patient Stated Goal: get stronger OT Goal Formulation: With patient Time For Goal Achievement: 09/13/19 Potential to Achieve Goals: Good  Plan Discharge plan remains appropriate    Co-evaluation    PT/OT/SLP Co-Evaluation/Treatment: Yes Reason for Co-Treatment: To address functional/ADL transfers PT goals addressed during session: Mobility/safety with mobility OT goals addressed during session: ADL's and self-care;Proper use of Adaptive equipment and DME;Strengthening/ROM      AM-PAC OT "6 Clicks" Daily Activity     Outcome Measure   Help from another person  eating meals?: None Help from another person taking care of personal grooming?: A Little Help from another person toileting, which includes using toliet, bedpan, or urinal?: A Lot Help from another person bathing (including washing, rinsing, drying)?: A Lot Help from another person to put on and taking off regular upper body clothing?: A Little Help from another person to put on and taking off regular lower body clothing?: A Lot 6 Click Score: 16    End of Session Equipment Utilized During Treatment: Left knee immobilizer  OT Visit Diagnosis: Muscle weakness (generalized) (M62.81);Pain Pain - Right/Left: Left Pain - part of body: Knee;Leg   Activity Tolerance Patient limited by pain   Patient Left in chair;with call bell/phone within reach;with chair alarm set;Other (comment)(in wheelchair)   Nurse Communication Mobility status        Time: [phone] OT Time Calculation (min): 34 min  Charges: OT General Charges $OT Visit: 1 Visit OT Treatments $Self Care/Home Management : 8-22 mins  Zenovia Jarred, MSOT, OTR/L Acute Rehabilitation Services [facility] Office Number: [phone]  Zenovia Jarred 09/02/2019, 2:30 PM

## 2019-09-03 DIAGNOSIS — R5381 Other malaise: Secondary | ICD-10-CM

## 2019-09-03 LAB — GLUCOSE, CAPILLARY
Glucose-Capillary: 104 mg/dL — ABNORMAL HIGH (ref 70–99)
Glucose-Capillary: 146 mg/dL — ABNORMAL HIGH (ref 70–99)
Glucose-Capillary: 131 mg/dL — ABNORMAL HIGH (ref 70–99)
Glucose-Capillary: 135 mg/dL — ABNORMAL HIGH (ref 70–99)

## 2019-09-03 MED ORDER — OXYCODONE HCL 5 MG PO TABS
20.0000 mg | ORAL_TABLET | ORAL | Status: DC | PRN
  Administered 2019-09-03 – 2019-09-07 (×18): 20 mg via ORAL
  Filled 2019-09-03 (×20): qty 4

## 2019-09-03 MED ORDER — NAPROXEN SODIUM 275 MG PO TABS
550.0000 mg | ORAL_TABLET | Freq: Two times a day (BID) | ORAL | Status: DC
  Administered 2019-09-03 – 2019-09-07 (×8): 550 mg via ORAL
  Filled 2019-09-03 (×9): qty 2

## 2019-09-03 MED ORDER — HYDROMORPHONE HCL 1 MG/ML IJ SOLN
2.0000 mg | Freq: Three times a day (TID) | INTRAMUSCULAR | Status: DC | PRN
  Administered 2019-09-03 – 2019-09-05 (×6): 2 mg via INTRAVENOUS
  Filled 2019-09-03 (×6): qty 2

## 2019-09-03 NOTE — Progress Notes (Signed)
PROGRESS NOTE    Vernon Frye  [id number] DOB: 04/11/1961 DOA: 07/12/2019 PCP: Nicholes Rough, PA-C   Brief Narrative: 59 year old who presents with the left knee and swelling.  Found to have septic arthritis of the knee.  Seen by orthopedic for left total knee arthroplasty.  Require intubation.    Found to have new onset A. fib with RVR, due to sepsis and bacteremia.  Seen by cardiology.  Had DCCV.  Improved.    Transfer to [facility] on 1 /6/21. Acute respiratory failure resolved, pt was successfully extubated. He's currently on RA with normal O2 sat.  Sepsis resolved. Pt has MRSA bacteremia. TEE negative for endocarditis. Repeated blood culture since 07/30/19 has remained negative. He needs to continue iv Vancomycin through 09/06/2019 per ID.   AKI and hyponatremia due to volume depletion, resolved with IVF.    Need a SNF but patient does not have insurance, so plan to discharge to home (if pain controlled and function improving) after completion of iv antibiotics on 09/06/19. Have adjusted pain medications, such that he can can IV dilaudid twice daily PRN. Otherwise no changes.  Assessment & Plan:   Principal Problem:   Septic joint ([place]) Active Problems:   Atrial fibrillation with rapid ventricular response (HCC)   Acute pain of left knee   MRSA bacteremia   Prosthetic joint infection (HCC)   Acute bilateral low back pain without sciatica   S/P revision of total knee   Malnutrition of moderate degree  1-New onset A. Fib: -Patient was seen in consultation by cardiology.  -Patient underwent DCCV and noted to have an enlarged right ventricle. -Patient currently on amiodarone and metoprolol for rate control. -Continue with Eliquis. -Stable.   2-MRSA bacteremia/left knee septic joint Repeated blood cultures from 07/30/2019 - -TEE done negative for any vegetation. -Continue with IV vancomycin through 09/06/2019 -Patient need a skilled nursing facility although he does not have  insurance will likely need to complete antibiotics in-house -pain is uncontrolled, given his septic joint, his pain is real and needs better regimen, will increase oxycodone to 20 mg q4h PRN; will allow max three times a day iv dilaudid 2 mg each, and will add on naproxen 550 mg bid with meals (given his mild AKI due to volume depletion resolved). This will prepare patient to go home with [facility] [place] once he completed iv antibiotics.  -he has been able to work more with PT. Hopefully mobility improve to the point he wont require SNF. He will complete antibiotics 09-06-2019  3-Right knee pain Effusion;  Patient decline arthrocentesis.  He wants medical management.  Continue with  voltaren gel. Ibuprofen for 2 more days./  Pain improved.   Hypertension: Continue with Toprol-XL.  Acute metabolic encephalopathy resolved  Polysubstance substance use/history of alcohol use Need to follow-up with his pain management physician  Lower Extremity edema: Doppler of lower extremity: Negative for DVT Improved with Ted hose and elevation.  Received one time dose lasix.   Anemia: Iron deficiency, B 12 Q000111Q, Folic acid 26, iron 36, ferritin 219. Started iron supplement.   Nutrition Problem: Moderate Malnutrition Etiology: social / environmental circumstances  Signs/Symptoms: percent weight loss, mild muscle depletion  Interventions: Tube feeding, Prostat  Estimated body mass index is 30.39 kg/m as calculated from the following:   Height as of this encounter: 6\' 3"  (1.905 m).   Weight as of this encounter: 110.3 kg.  DVT prophylaxis: Eliquis Code Status: Full code Family Communication:  None present Disposition Plan:  -Need  to be discharged to skilled nursing facility.  Likely to remain in-house until he finished IV antibiotics completed 09/06/2019. -If patient improve physically and is not able to require SNF placement could discussed with ID others options for antibiotics. Patient encourage to  work with PT.   Consultants:   Orthopedics: [name] 07/13/2019  Cardiology: Dr. Daneen Schick 07/13/2019  Procedures:   CT head 07/24/2019  CT left knee 07/23/2019  PICC line placement  Renal ultrasound 07/13/2019  MRI L-spine 07/18/2019  2D echo 07/16/2019  TEE 08/02/2019  Left lower extremity Doppler 07/29/2019, 07/13/2019  Upper extremity Dopplers 07/29/2019, 07/21/2019  Left knee aspiration at bedside 07/13/2019 per [name]  Irrigation and debridement left total knee with polyexchange per [name] 07/13/2019  Antimicrobials:  IV vancomycin 07/25/2019>>>>>> 09/06/2019  Subjective: Stated his pain is worse and would like to upscale his pain meds  Objective: Vitals:   09/02/19 0610 09/02/19 1500 09/02/19 2034 09/03/19 0522  BP: 112/71 115/77 (!) 146/77 106/74  Pulse: 67 76 79 70  Resp: 18 18 18 19   Temp: 98.2 F (36.8 C) 98.6 F (37 C) 99 F (37.2 C) 97.9 F (36.6 C)  TempSrc: Oral Oral  Oral  SpO2: 99% 99% 97% 98%  Weight:      Height:        Intake/Output Summary (Last 24 hours) at 09/03/2019 1247 Last data filed at 09/03/2019 0900 Gross per 24 hour  Intake 1688.55 ml  Output 3100 ml  Net -1411.45 ml   Filed Weights   08/26/19 0500 09/01/19 0500 09/02/19 0500  Weight: 109.2 kg 111.2 kg 110.3 kg   Examination: General exam: NAD, obese Respiratory system: CTA Cardiovascular system: S 1, S 2 RRR Gastrointestinal : system; BS present, soft, nt Central nervous system: Alert and oriented Extremities:  Left knee warm, tender, swelling, normal color, limited ROM due to pain and swelling  Data Reviewed: I have personally reviewed following labs and imaging studies  CBC: Recent Labs  Lab 09/01/19 0416  WBC 7.9  HGB 9.1*  HCT 30.3*  MCV 91.0  PLT [id number]   Basic Metabolic Panel: Recent Labs  Lab 08/28/19 1003 09/01/19 0416  NA 138 139  K 3.9 4.2  CL 98 99  CO2 29 29  GLUCOSE 168* 148*  BUN 18 21*  CREATININE 0.75 0.70  CALCIUM 9.4 9.2    GFR: Estimated Creatinine Clearance: 135 mL/min (by C-G formula based on SCr of 0.7 mg/dL). Liver Function Tests: No results for input(s): AST, ALT, ALKPHOS, BILITOT, PROT, ALBUMIN in the last 168 hours. No results for input(s): LIPASE, AMYLASE in the last 168 hours. No results for input(s): AMMONIA in the last 168 hours. Coagulation Profile: No results for input(s): INR, PROTIME in the last 168 hours. Cardiac Enzymes: No results for input(s): CKTOTAL, CKMB, CKMBINDEX, TROPONINI in the last 168 hours. BNP (last 3 results) No results for input(s): PROBNP in the last 8760 hours. HbA1C: No results for input(s): HGBA1C in the last 72 hours. CBG: Recent Labs  Lab 09/02/19 1148 09/02/19 1638 09/02/19 2027 09/03/19 0746 09/03/19 1145  GLUCAP 155* 197* 106* 104* 146*   Lipid Profile: No results for input(s): CHOL, HDL, LDLCALC, TRIG, CHOLHDL, LDLDIRECT in the last 72 hours. Thyroid Function Tests: No results for input(s): TSH, T4TOTAL, FREET4, T3FREE, THYROIDAB in the last 72 hours. Anemia Panel: No results for input(s): VITAMINB12, FOLATE, FERRITIN, TIBC, IRON, RETICCTPCT in the last 72 hours. Sepsis Labs: No results for input(s): PROCALCITON, LATICACIDVEN in the last 168  hours.  No results found for this or any previous visit (from the past 240 hour(s)).   Radiology Studies: No results found.  Scheduled Meds: . acetaminophen  650 mg Oral Q6H  . amiodarone  200 mg Oral Daily  . apixaban  5 mg Oral BID  . chlorhexidine  15 mL Mouth Rinse BID  . Chlorhexidine Gluconate Cloth  6 each Topical Daily  . diclofenac Sodium  2 g Topical QID  . docusate sodium  100 mg Oral BID  . feeding supplement (ENSURE ENLIVE)  237 mL Oral BID BM  . ferrous sulfate  325 mg Oral BID WC  . folic acid  1 mg Oral Daily  . gabapentin  300 mg Oral TID  . insulin aspart  0-6 Units Subcutaneous TID AC & HS  . magnesium oxide  400 mg Oral BID  . metoprolol succinate  200 mg Oral Daily  . morphine   15 mg Oral Q12H  . multivitamin with minerals  1 tablet Oral Daily  . pantoprazole  40 mg Oral Daily  . psyllium  1 packet Oral Daily  . QUEtiapine  25 mg Oral BID  . sodium chloride flush  10-40 mL Intracatheter Q12H  . thiamine  100 mg Oral Daily   Continuous Infusions: . vancomycin 1,250 mg (09/03/19 1100)     LOS: 52 days   Time spent: 25 minutes  Paticia Stack, MD [facility]  If 7PM-7AM, please contact night-coverage  09/03/2019, 12:47 PM

## 2019-09-04 LAB — BASIC METABOLIC PANEL
Anion gap: 10 (ref 5–15)
BUN: 21 mg/dL — ABNORMAL HIGH (ref 6–20)
CO2: 29 mmol/L (ref 22–32)
Calcium: 9.3 mg/dL (ref 8.9–10.3)
Chloride: 100 mmol/L (ref 98–111)
Creatinine, Ser: 0.7 mg/dL (ref 0.61–1.24)
GFR calc Af Amer: >60 mL/min (ref >60)
GFR calc non Af Amer: >60 mL/min (ref >60)
Glucose, Bld: 156 mg/dL — ABNORMAL HIGH (ref 70–99)
Potassium: 4.2 mmol/L (ref 3.5–5.1)
Sodium: 139 mmol/L (ref 135–145)

## 2019-09-04 LAB — GLUCOSE, CAPILLARY
Glucose-Capillary: 102 mg/dL — ABNORMAL HIGH (ref 70–99)
Glucose-Capillary: 139 mg/dL — ABNORMAL HIGH (ref 70–99)
Glucose-Capillary: 137 mg/dL — ABNORMAL HIGH (ref 70–99)
Glucose-Capillary: 158 mg/dL — ABNORMAL HIGH (ref 70–99)

## 2019-09-04 LAB — CBC
HCT: 30.7 % — ABNORMAL LOW (ref 39.0–52.0)
Hemoglobin: 9.2 g/dL — ABNORMAL LOW (ref 13.0–17.0)
MCH: 27.1 pg (ref 26.0–34.0)
MCHC: 30 g/dL (ref 30.0–36.0)
MCV: 90.6 fL (ref 80.0–100.0)
Platelets: 309 10*3/uL (ref 150–400)
RBC: 3.39 MIL/uL — ABNORMAL LOW (ref 4.22–5.81)
RDW: 14.6 % (ref 11.5–15.5)
WBC: 7 10*3/uL (ref 4.0–10.5)
nRBC: 0 % (ref 0.0–0.2)

## 2019-09-04 NOTE — Progress Notes (Signed)
PROGRESS NOTE    Vernon Frye  [id number] DOB: 1960/10/10 DOA: 07/12/2019 PCP: Nicholes Rough, PA-C   Brief Narrative: 59 year old who presents with the left knee and swelling.  Found to have septic arthritis of the knee.  Seen by orthopedic for left total knee arthroplasty.  Require intubation.    Found to have new onset A. fib with RVR, due to sepsis and bacteremia.  Seen by cardiology.  Had DCCV.  Improved.    Transfer to [facility] on 1 /6/21. Acute respiratory failure resolved, pt was successfully extubated. He's currently on RA with normal O2 sat.  Sepsis resolved. Pt has MRSA bacteremia. TEE negative for endocarditis. Repeated blood culture since 07/30/19 has remained negative. He needs to continue iv Vancomycin through 09/06/2019 per ID.   AKI and hyponatremia due to volume depletion, resolved with IVF.    Need a SNF but patient does not have insurance, so plan to discharge to home (if pain controlled and function improving) after completion of iv antibiotics on 09/06/19. Have adjusted pain medications, such that he can can IV dilaudid twice daily PRN. Otherwise no changes.  Assessment & Plan:   Principal Problem:   Septic joint ([place]) Active Problems:   Atrial fibrillation with rapid ventricular response (HCC)   Acute pain of left knee   MRSA bacteremia   Prosthetic joint infection (HCC)   Acute bilateral low back pain without sciatica   S/P revision of total knee   Malnutrition of moderate degree  1-New onset A. Fib: -Patient was seen in consultation by cardiology.  -Patient underwent DCCV and noted to have an enlarged right ventricle. -Patient currently on amiodarone and metoprolol for rate control. -Continue with Eliquis. -Stable.   2-MRSA bacteremia/left knee septic joint Repeated blood cultures from 07/30/2019 - -TEE done negative for any vegetation. -Continue with IV vancomycin through 09/06/2019 -Patient need a skilled nursing facility although he does not have  insurance will likely need to complete antibiotics in-house -pain is uncontrolled, given his septic joint, his pain is real and needs better regimen, will continue oxycodone to 20 mg q4h PRN; will allow max three times a day iv dilaudid 2 mg each, and added on naproxen 550 mg bid with meals (given his mild AKI due to volume depletion resolved; but will monitor his renal function closely). This will prepare patient to go home with [facility] [place] once he completed iv antibiotics.  -he has been able to work more with PT. Hopefully mobility improve to the point he wont require SNF. He will complete antibiotics 09-06-2019  3-Right knee pain Effusion;  Patient decline arthrocentesis.  He wants medical management.  Continue with  voltaren gel. Ibuprofen for 2 more days./  Pain improved.   Hypertension: Continue with Toprol-XL.  Acute metabolic encephalopathy resolved  Polysubstance substance use/history of alcohol use Need to follow-up with his pain management physician  Lower Extremity edema: Doppler of lower extremity: Negative for DVT Improved with Ted hose and elevation.  Received one time dose lasix.   Anemia: Iron deficiency, B 12 Q000111Q, Folic acid 26, iron 36, ferritin 219. Started iron supplement.   Nutrition Problem: Moderate Malnutrition Etiology: social / environmental circumstances  Signs/Symptoms: percent weight loss, mild muscle depletion  Interventions: Tube feeding, Prostat  Estimated body mass index is 30.39 kg/m as calculated from the following:   Height as of this encounter: 6\' 3"  (1.905 m).   Weight as of this encounter: 110.3 kg.  DVT prophylaxis: Eliquis Code Status: Full code Family Communication:  None present Disposition Plan:  -Need to be discharged to skilled nursing facility.  Likely to remain in-house until he finished IV antibiotics completed 09/06/2019. -If patient improve physically and is not able to require SNF placement could discussed with ID others  options for antibiotics. Patient encourage to work with PT.   Consultants:   Orthopedics: [name] 07/13/2019  Cardiology: Dr. Daneen Schick 07/13/2019  Procedures:   CT head 07/24/2019  CT left knee 07/23/2019  PICC line placement  Renal ultrasound 07/13/2019  MRI L-spine 07/18/2019  2D echo 07/16/2019  TEE 08/02/2019  Left lower extremity Doppler 07/29/2019, 07/13/2019  Upper extremity Dopplers 07/29/2019, 07/21/2019  Left knee aspiration at bedside 07/13/2019 per [name]  Irrigation and debridement left total knee with polyexchange per [name] 07/13/2019  Antimicrobials:  IV vancomycin 07/25/2019>>>>>> 09/06/2019  Subjective: Stated his pain is much better and without PT he was able to get up and do his own PT today with wheelchair and walker  Objective: Vitals:   09/03/19 1554 09/03/19 2211 09/04/19 0522 09/04/19 1254  BP: 126/82 (!) 144/87 122/70 (!) 138/93  Pulse: 77 74 65 74  Resp: 19 18 20 18   Temp: 98.2 F (36.8 C) 98.6 F (37 C) 98.4 F (36.9 C) 98.8 F (37.1 C)  TempSrc: Oral Oral Oral Oral  SpO2: 98% 99% 98% 99%  Weight:      Height:        Intake/Output Summary (Last 24 hours) at 09/04/2019 1354 Last data filed at 09/04/2019 0829 Gross per 24 hour  Intake 360 ml  Output 1350 ml  Net -990 ml   Filed Weights   08/26/19 0500 09/01/19 0500 09/02/19 0500  Weight: 109.2 kg 111.2 kg 110.3 kg   Examination: General exam: NAD, obese Respiratory system: CTA Cardiovascular system: S 1, S 2 RRR Gastrointestinal : system; BS present, soft, nt Central nervous system: Alert and oriented Extremities:  Left knee warm, tender, swelling, normal color, limited ROM due to pain and swelling  Data Reviewed: I have personally reviewed following labs and imaging studies  CBC: Recent Labs  Lab 09/01/19 0416 09/04/19 0348  WBC 7.9 7.0  HGB 9.1* 9.2*  HCT 30.3* 30.7*  MCV 91.0 90.6  PLT 308 Q000111Q   Basic Metabolic Panel: Recent Labs  Lab 09/01/19 0416  09/04/19 0348  NA 139 139  K 4.2 4.2  CL 99 100  CO2 29 29  GLUCOSE 148* 156*  BUN 21* 21*  CREATININE 0.70 0.70  CALCIUM 9.2 9.3   GFR: Estimated Creatinine Clearance: 135 mL/min (by C-G formula based on SCr of 0.7 mg/dL). Liver Function Tests: No results for input(s): AST, ALT, ALKPHOS, BILITOT, PROT, ALBUMIN in the last 168 hours. No results for input(s): LIPASE, AMYLASE in the last 168 hours. No results for input(s): AMMONIA in the last 168 hours. Coagulation Profile: No results for input(s): INR, PROTIME in the last 168 hours. Cardiac Enzymes: No results for input(s): CKTOTAL, CKMB, CKMBINDEX, TROPONINI in the last 168 hours. BNP (last 3 results) No results for input(s): PROBNP in the last 8760 hours. HbA1C: No results for input(s): HGBA1C in the last 72 hours. CBG: Recent Labs  Lab 09/03/19 1145 09/03/19 1648 09/03/19 2210 09/04/19 0825 09/04/19 1259  GLUCAP 146* 131* 135* 102* 139*   Lipid Profile: No results for input(s): CHOL, HDL, LDLCALC, TRIG, CHOLHDL, LDLDIRECT in the last 72 hours. Thyroid Function Tests: No results for input(s): TSH, T4TOTAL, FREET4, T3FREE, THYROIDAB in the last 72 hours. Anemia Panel: No  results for input(s): VITAMINB12, FOLATE, FERRITIN, TIBC, IRON, RETICCTPCT in the last 72 hours. Sepsis Labs: No results for input(s): PROCALCITON, LATICACIDVEN in the last 168 hours.  No results found for this or any previous visit (from the past 240 hour(s)).   Radiology Studies: No results found.  Scheduled Meds: . acetaminophen  650 mg Oral Q6H  . amiodarone  200 mg Oral Daily  . apixaban  5 mg Oral BID  . chlorhexidine  15 mL Mouth Rinse BID  . Chlorhexidine Gluconate Cloth  6 each Topical Daily  . diclofenac Sodium  2 g Topical QID  . docusate sodium  100 mg Oral BID  . feeding supplement (ENSURE ENLIVE)  237 mL Oral BID BM  . ferrous sulfate  325 mg Oral BID WC  . folic acid  1 mg Oral Daily  . gabapentin  300 mg Oral TID  . insulin  aspart  0-6 Units Subcutaneous TID AC & HS  . magnesium oxide  400 mg Oral BID  . metoprolol succinate  200 mg Oral Daily  . morphine  15 mg Oral Q12H  . multivitamin with minerals  1 tablet Oral Daily  . naproxen sodium  550 mg Oral BID WC  . pantoprazole  40 mg Oral Daily  . psyllium  1 packet Oral Daily  . QUEtiapine  25 mg Oral BID  . sodium chloride flush  10-40 mL Intracatheter Q12H  . thiamine  100 mg Oral Daily   Continuous Infusions: . vancomycin 1,250 mg (09/04/19 0921)     LOS: 53 days   Time spent: 25 minutes with >50% of time on direct patient care and plan formulation  Paticia Stack, MD [facility]  If 7PM-7AM, please contact night-coverage  09/04/2019, 1:54 PM

## 2019-09-05 DIAGNOSIS — E440 Moderate protein-calorie malnutrition: Secondary | ICD-10-CM | POA: Diagnosis present

## 2019-09-05 LAB — CBC
HCT: 29.5 % — ABNORMAL LOW (ref 39.0–52.0)
Hemoglobin: 8.8 g/dL — ABNORMAL LOW (ref 13.0–17.0)
MCH: 27.2 pg (ref 26.0–34.0)
MCHC: 29.8 g/dL — ABNORMAL LOW (ref 30.0–36.0)
MCV: 91.3 fL (ref 80.0–100.0)
Platelets: 274 10*3/uL (ref 150–400)
RBC: 3.23 MIL/uL — ABNORMAL LOW (ref 4.22–5.81)
RDW: 14.6 % (ref 11.5–15.5)
WBC: 7 10*3/uL (ref 4.0–10.5)
nRBC: 0 % (ref 0.0–0.2)

## 2019-09-05 LAB — GLUCOSE, CAPILLARY
Glucose-Capillary: 101 mg/dL — ABNORMAL HIGH (ref 70–99)
Glucose-Capillary: 115 mg/dL — ABNORMAL HIGH (ref 70–99)
Glucose-Capillary: 110 mg/dL — ABNORMAL HIGH (ref 70–99)
Glucose-Capillary: 113 mg/dL — ABNORMAL HIGH (ref 70–99)

## 2019-09-05 LAB — BASIC METABOLIC PANEL
Anion gap: 11 (ref 5–15)
BUN: 25 mg/dL — ABNORMAL HIGH (ref 6–20)
CO2: 26 mmol/L (ref 22–32)
Calcium: 8.6 mg/dL — ABNORMAL LOW (ref 8.9–10.3)
Chloride: 98 mmol/L (ref 98–111)
Creatinine, Ser: 0.73 mg/dL (ref 0.61–1.24)
GFR calc Af Amer: >60 mL/min (ref >60)
GFR calc non Af Amer: >60 mL/min (ref >60)
Glucose, Bld: 156 mg/dL — ABNORMAL HIGH (ref 70–99)
Potassium: 3.9 mmol/L (ref 3.5–5.1)
Sodium: 135 mmol/L (ref 135–145)

## 2019-09-05 LAB — C-REACTIVE PROTEIN: CRP: 4.3 mg/dL — ABNORMAL HIGH (ref <1.0)

## 2019-09-05 LAB — SEDIMENTATION RATE: Sed Rate: 68 mm/hr — ABNORMAL HIGH (ref 0–16)

## 2019-09-05 MED ORDER — HYDROMORPHONE HCL 1 MG/ML IJ SOLN
1.0000 mg | Freq: Three times a day (TID) | INTRAMUSCULAR | Status: DC | PRN
  Administered 2019-09-05 (×2): 1 mg via INTRAVENOUS
  Filled 2019-09-05 (×2): qty 1

## 2019-09-05 NOTE — Plan of Care (Signed)

## 2019-09-05 NOTE — TOC Progression Note (Signed)
Transition of Care [facility]) - Progression Note    Patient Details  Name: Vernon Frye MRN: [id number] Date of Birth: 15-Jul-1961  Transition of [facility] CM/SW Contact  Jacalyn Lefevre Edson Snowball, RN Phone Number: 09/05/2019, 1:32 PM  Clinical Narrative:     Patient states he has no one at home to help him. He cannot private pay for SNF. He is requesting to speak to his surgeon , messaged attending.   Discussed with Olga Coaster [facility].  Expected Discharge Plan: [place] Barriers to Discharge: Continued Medical Work up, Inadequate or no insurance  Expected Discharge Plan and Services Expected Discharge Plan: [place]   Discharge Planning Services: CM Consult   Living arrangements for the past 2 months: Single Family Home                                       Social Determinants of Health (SDOH) Interventions    Readmission Risk Interventions No flowsheet data found.

## 2019-09-05 NOTE — Progress Notes (Signed)
Pharmacy Antibiotic Note  Vernon Frye is a 59 y.o. male admitted on 07/12/2019 with MRSA bacteremia and septic knee joint infection.  Pharmacy has been consulted for vancomycin dosing. Pt is on extended infusion time (2 hours) to alleviate Red Man Syndrome; no adverse effects reported since this was begun.   Calculated vancomycin AUC, using the last levels, was 493.2, which is within the goal vancomycin AUC range of 400-550  WBC 7, afebrile; Scr 0.7, CrCl >120 ml/min (renal function stable)    Plan: Continue vancomycin 1250 mg IV Q 12 hrs (infused over 120 mins) until 09/06/19 Monitor WBC, temp, renal function (at least twice weekly),  ID planning vancomycin continuing through 09/06/19 (6 weeks from removal of prosthetic material on 07/26/19)  Height: 6\' 3"  (190.5 cm) Weight: 243 lb 2.7 oz (110.3 kg) IBW/kg (Calculated) : 84.5  Temp (24hrs), Avg:98.3 F (36.8 C), Min:97.5 F (36.4 C), Max:98.8 F (37.1 C)  Recent Labs  Lab 08/29/19 1732 08/30/19 1440 08/30/19 1922 09/01/19 0416 09/04/19 0348  WBC  --   --   --  7.9 7.0  CREATININE  --   --   --  0.70 0.70  VANCOTROUGH 17  --  15  --   --   VANCOPEAK  --  19*  --   --   --     Estimated Creatinine Clearance: 135 mL/min (by C-G formula based on SCr of 0.7 mg/dL).    Allergies  Allergen Reactions  . Vancomycin Shortness Of Breath and Rash    [name]     Antimicrobials this admission: Vanc 12/22>>(09/06/19) Ceftriaxone 12/16 x1 Zosyn 12/16 x1  Dapto 12/16>>12/22  Microbiology results: 12/16 BCx 2/2 MRSA 12/16L knee fluid: abundant MRSA 12/16 Ucx: neg 12/16 Grp A strep - neg 12/17 BCx >>1/2 SA 12/18 BCx: NG/final 1/2 Cdiff >> neg 1/2 UCx >>  >100,000 colonies/mL Klebsiella oxytoca (no tx, asymptomatic) 1/2 BCx: NG/final  Adelfo Diebel A. Levada Dy, PharmD, BCPS, FNKF Clinical Pharmacist [place] Please utilize Amion for appropriate phone number to reach the unit pharmacist ([place])

## 2019-09-05 NOTE — Progress Notes (Signed)
Physical Therapy Treatment Patient Details Name: ZAHEER SZCZESNIAK MRN: [id number] DOB: 26-Mar-1961 Today's Date: 09/05/2019    History of Present Illness Pt is a 59 y/o M PMH Afib, HTN, L TKR (2016) who presented to ED with Lt knee pain. 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 Desaturated with hypoxemic respiratory failure requiring 3LNC. Pt underwent L TKA removal with placement of antibiotic spacers on 12/29, agitated after procedure and unable to be extubated. Extubated 07/31/19.    PT Comments    Pt pleasant and willing to participate this session. PT requesting IV pain meds despite premedication with oxy which was received EOB. Pt with ability to increase gait to 12' and propel WC in hallway with assist only for lines. Pt continues to require assist to don KI with limited adherence to maintaining KI. PT also self reports limited ankle ROM and that he is stretching into dorsiflexion at times. Pt with improved mobility with knowledge of WC function. Will continue to follow to maximize function.     Follow Up Recommendations  SNF;Supervision/Assistance - 24 hour     Equipment Recommendations  Wheelchair (measurements PT);Wheelchair cushion (measurements PT)    Recommendations for Other Services       Precautions / Restrictions Precautions Precautions: Fall Required Braces or Orthoses: Knee Immobilizer - Left Knee Immobilizer - Left: On at all times Restrictions LLE Weight Bearing: Touchdown weight bearing Other Position/Activity Restrictions: No L knee ROM    Mobility  Bed Mobility Overal bed mobility: Needs Assistance Bed Mobility: Supine to Sit     Supine to sit: Min guard     General bed mobility comments: pt flattened bed then initially asking for assist but ultimately able to transition to long sitting and pivot to EOb with guarding for lines and safety  Transfers Overall transfer level: Needs  assistance   Transfers: Sit to/from Stand Sit to Stand: Min assist;+2 physical assistance         General transfer comment: cues for hand placement, safety and elevated bed to rise from surface with KI on and cues to maintain TDWB LLE  Ambulation/Gait Ambulation/Gait assistance: Min guard;+2 safety/equipment Gait Distance (Feet): 12 Feet Assistive device: Rolling walker (2 wheeled) Gait Pattern/deviations: Step-to pattern   Gait velocity interpretation: <1.8 ft/sec, indicate of risk for recurrent falls General Gait Details: pt maintaining TDWB with gait but needs cues and assist to position LLE for transfer from standing to WC. WC to follow to increase gait distance   Theme park manager propulsion: Both upper extremities Wheelchair parts: Needs assistance Distance: pt able to direct and propel WC 300'. Wheelchair Assistance Details (indicate cue type and reason): assistance to place and position bil foot rests, cues for brake use.  Modified Rankin (Stroke Patients Only)       Balance Overall balance assessment: Needs assistance Sitting-balance support: Feet supported Sitting balance-Leahy Scale: Good Sitting balance - Comments: sitting EOB with supervision   Standing balance support: Bilateral upper extremity supported;During functional activity Standing balance-Leahy Scale: Poor Standing balance comment: bil UE support for TDWB LLE                            Cognition Arousal/Alertness: Awake/alert Behavior During Therapy: WFL for tasks assessed/performed Overall Cognitive Status: Impaired/Different from baseline Area of Impairment: Memory;Safety/judgement  Memory: Decreased short-term memory;Decreased recall of precautions   Safety/Judgement: Decreased awareness of safety;Decreased awareness of deficits   Problem Solving: Requires verbal cues General Comments: pt  oriented to time, KI off on arrival with knee in near 90 degree flexion on pillows with pt stating he knows he is supposed to have KI on and refusing ankle splint. Needs redirection to task despite education for plan at beginning of session      Exercises      General Comments        Pertinent Vitals/Pain Pain Score: 6  Pain Location: L knee Pain Descriptors / Indicators: Aching;Sore;Guarding Pain Intervention(s): Limited activity within patient's tolerance;Monitored during session;Repositioned;Premedicated before session;RN gave pain meds during session    Home Living                      Prior Function            PT Goals (current goals can now be found in the care plan section) Acute Rehab PT Goals Time For Goal Achievement: 09/19/19 Potential to Achieve Goals: Fair Progress towards PT goals: Progressing toward goals    Frequency           PT Plan Current plan remains appropriate    Co-evaluation              AM-PAC PT "6 Clicks" Mobility   Outcome Measure  Help needed turning from your back to your side while in a flat bed without using bedrails?: A Little Help needed moving from lying on your back to sitting on the side of a flat bed without using bedrails?: A Little Help needed moving to and from a bed to a chair (including a wheelchair)?: A Little Help needed standing up from a chair using your arms (e.g., wheelchair or bedside chair)?: A Little Help needed to walk in hospital room?: A Lot Help needed climbing 3-5 steps with a railing? : Total 6 Click Score: 15    End of Session Equipment Utilized During Treatment: Left knee immobilizer;Gait belt Activity Tolerance: Patient tolerated treatment well Patient left: in chair;with call bell/phone within reach(WC with chair alarm and brakes locked) Nurse Communication: Mobility status;Precautions;Weight bearing status PT Visit Diagnosis: Other abnormalities of gait and mobility  (R26.89);Pain;Muscle weakness (generalized) (M62.81);Difficulty in walking, not elsewhere classified (R26.2)     Time: [id number] PT Time Calculation (min) (ACUTE ONLY): 34 min  Charges:  $Gait Training: 8-22 mins $Wheel Chair Management: 8-22 mins                     Bayard Males, PT Acute Rehabilitation Services Pager: [phone] Office: [place] [facility] 09/05/2019, 1:52 PM

## 2019-09-05 NOTE — Progress Notes (Addendum)
PROGRESS NOTE    Vernon Frye  [id number] DOB: January 09, 1961 DOA: 07/12/2019 PCP: Nicholes Rough, PA-C     Brief Narrative:  59 year old male presented on 12/15 for left knee pain and swelling.  Found to have septic arthritis and MRSA bacteremia.  He did require intubation.  He is currently on vancomycin IV.  He needed SNF but does not have insurance so he is here through 12/9 receiving IV antibiotics.  He was found to have new onset atrial fibrillation with rapid ventricular response and seen by cardiology.  Had DCCV and improved.  Transferred to the [facility] team on 1/6 after his respiratory failure improved and he no longer required intubation.  He has required high amounts of narcotics to control his pain.  He started physical therapy on 1/18.  Limited progress.   New events last 24 hours / Subjective: Last dose of vancomycin tomorrow on 2/9.  Patient reports he still having quite a bit of pain.  No fevers, redness, or significant swelling of the left knee.  Assessment & Plan:   Principal Problem:   Septic joint ([place])  Continue vancomycin, last dose is tomorrow 2/9  Working with physical therapy  Will be discharged home with home health  I will start to wean down his Dilaudid to 1 mg 3 times daily as needed from 2 mg 3 times daily  Patient has 20 mg of oxycodone ordered every 4 hours, we will try to wean for this as well, but will start with the Dilaudid  I did speak with the surgeon, [name] who will be by to see the patient or at least call him today regarding discharge  I spoke with ID team, Dr. Megan Salon who recommended rifampin and doxycycline for 6 total months and outpatient follow-up with them  We will check a sed rate and CRP to see if things are trending down   Active Problems:   Atrial fibrillation with rapid ventricular response (HCC)  Eliquis 5 mg twice daily  Amiodarone 200 mg daily  Appreciate prior input from cardiology    MRSA bacteremia  Vancomycin as  above    Prosthetic joint infection ([place])  As above    Acute bilateral low back pain without sciatica  Gabapentin 300 mg 3 times daily    Malnutrition of moderate degree    Nutrition Problem: Moderate Malnutrition Etiology: social / environmental circumstances   DVT prophylaxis: Eliquis Code Status: Full Family Communication: Self Disposition Plan: SNF was recommended but due to no insurance, stayed in the hospital foR 6 weeks for IV antibiotics, he will go home; barriers to discharge include poor pain tolerance requiring high amounts of IV pain medicine and weaning him to acceptable home regimen; he does follow with pain management outpatient; another barrier is lack of insurance as he could not get to SNF.   Consultants:   Orthopedic surgery  Procedures:   PICC line placement 07/29/19  Left knee aspiration at bedside 07/13/2019 per [name]  Irrigation and debridement left total knee with polyexchange per [name] 07/13/2019  Antimicrobials:  Anti-infectives (From admission, onward)   Start     Dose/Rate Route Frequency Ordered Stop   08/27/19 1800  vancomycin (VANCOREADY) IVPB 1250 mg/250 mL     1,250 mg 125 mL/hr over 120 Minutes Intravenous Every 12 hours 08/27/19 0738     08/21/19 1830  vancomycin (VANCOCIN) IVPB 1000 mg/200 mL premix  Status:  Discontinued     1,000 mg 100 mL/hr over 120 Minutes Intravenous  Every 12 hours 08/21/19 1802 08/27/19 0738   08/19/19 1800  vancomycin (VANCOREADY) IVPB 750 mg/150 mL  Status:  Discontinued     750 mg 75 mL/hr over 120 Minutes Intravenous Every 12 hours 08/19/19 0822 08/21/19 1802   08/18/19 1630  vancomycin (VANCOREADY) IVPB 750 mg/150 mL  Status:  Discontinued     750 mg 150 mL/hr over 60 Minutes Intravenous Every 12 hours 08/18/19 1608 08/19/19 0822   08/16/19 0300  vancomycin (VANCOCIN) IVPB 1000 mg/200 mL premix  Status:  Discontinued     1,000 mg 50 mL/hr over 240 Minutes Intravenous Every 12 hours 08/15/19 1526  08/18/19 1608   08/01/19 0000  vancomycin IVPB  Status:  Discontinued     1,250 mg Intravenous Every 12 hours 08/01/19 1320 08/22/19    07/26/19 1505  tobramycin (NEBCIN) powder  Status:  Discontinued       As needed 07/26/19 1506 07/26/19 1603   07/26/19 1504  vancomycin (VANCOCIN) powder  Status:  Discontinued       As needed 07/26/19 1505 07/26/19 1603   07/25/19 1500  vancomycin (VANCOREADY) IVPB 1250 mg/250 mL  Status:  Discontinued     1,250 mg 62.5 mL/hr over 240 Minutes Intravenous Every 12 hours 07/25/19 1443 08/15/19 1525   07/22/19 1000  vancomycin (VANCOREADY) IVPB 1500 mg/300 mL  Status:  Discontinued     1,500 mg 75 mL/hr over 240 Minutes Intravenous Every 12 hours 07/22/19 0237 07/25/19 1443   07/19/19 1300  vancomycin (VANCOCIN) IVPB 1000 mg/200 mL premix  Status:  Discontinued     1,000 mg 66.7 mL/hr over 180 Minutes Intravenous Every 12 hours 07/19/19 1145 07/22/19 0236   07/14/19 2000  DAPTOmycin (CUBICIN) 1,000 mg in sodium chloride 0.9 % IVPB  Status:  Discontinued     1,000 mg 240 mL/hr over 30 Minutes Intravenous Daily 07/14/19 0807 07/19/19 1145   07/13/19 2119  vancomycin (VANCOCIN) powder  Status:  Discontinued       As needed 07/13/19 2119 07/13/19 2154   07/13/19 1400  DAPTOmycin (CUBICIN) 944 mg in sodium chloride 0.9 % IVPB  Status:  Discontinued     8 mg/kg  118 kg 237.8 mL/hr over 30 Minutes Intravenous Daily 07/13/19 1320 07/14/19 0807   07/13/19 1015  piperacillin-tazobactam (ZOSYN) IVPB 3.375 g  Status:  Discontinued     3.375 g 12.5 mL/hr over 240 Minutes Intravenous Every 8 hours 07/13/19 1002 07/13/19 1535   07/13/19 0400  cefTRIAXone (ROCEPHIN) 2 g in sodium chloride 0.9 % 100 mL IVPB  Status:  Discontinued     2 g 200 mL/hr over 30 Minutes Intravenous Every 24 hours 07/13/19 0337 07/13/19 0951   07/13/19 0400  vancomycin (VANCOCIN) 2,000 mg in sodium chloride 0.9 % 500 mL IVPB     2,000 mg 250 mL/hr over 120 Minutes Intravenous  Once 07/13/19  0339 07/13/19 0707   07/13/19 0339  vancomycin variable dose per unstable renal function (pharmacist dosing)  Status:  Discontinued      Does not apply See admin instructions 07/13/19 0340 07/13/19 0911        Objective: Vitals:   09/04/19 0522 09/04/19 1254 09/04/19 2151 09/05/19 0610  BP: 122/70 (!) 138/93 137/90 130/82  Pulse: 65 74 80 70  Resp: 20 18 20 18   Temp: 98.4 F (36.9 C) 98.8 F (37.1 C) 98.6 F (37 C) (!) 97.5 F (36.4 C)  TempSrc: Oral Oral Oral Oral  SpO2: 98% 99% 99% 100%  Weight:  Height:        Intake/Output Summary (Last 24 hours) at 09/05/2019 1028 Last data filed at 09/04/2019 2100 Gross per 24 hour  Intake 840 ml  Output 975 ml  Net -135 ml   Filed Weights   08/26/19 0500 09/01/19 0500 09/02/19 0500  Weight: 109.2 kg 111.2 kg 110.3 kg    Examination:  General exam: Appears calm and comfortable  Respiratory system: Clear to auscultation. Respiratory effort normal. No respiratory distress. No conversational dyspnea.  Cardiovascular system: S1 & S2 heard, RRR. No murmurs. No pedal edema. Gastrointestinal system: Abdomen is nondistended, soft and nontender. Normal bowel sounds heard. Central nervous system: Alert and oriented. No focal neurological deficits. Speech clear.  Extremities: L knee w surgical incision scar without erythema, excessive warmth or swelling; pain with movement Skin: No rashes, lesions or ulcers on exposed skin  Psychiatry: Judgement and insight appear normal. Mood & affect appropriate.   Data Reviewed: I have personally reviewed following labs and imaging studies  CBC: Recent Labs  Lab 09/01/19 0416 09/04/19 0348  WBC 7.9 7.0  HGB 9.1* 9.2*  HCT 30.3* 30.7*  MCV 91.0 90.6  PLT 308 [id number]   Basic Metabolic Panel: Recent Labs  Lab 09/01/19 0416 09/04/19 0348  NA 139 139  K 4.2 4.2  CL 99 100  CO2 29 29  GLUCOSE 148* 156*  BUN 21* 21*  CREATININE 0.70 0.70  CALCIUM 9.2 9.3  CBG: Recent Labs  Lab  09/04/19 0825 09/04/19 1259 09/04/19 1801 09/04/19 2229 09/05/19 0806  GLUCAP 102* 139* 137* 158* 101*   Scheduled Meds: . acetaminophen  650 mg Oral Q6H  . amiodarone  200 mg Oral Daily  . apixaban  5 mg Oral BID  . chlorhexidine  15 mL Mouth Rinse BID  . Chlorhexidine Gluconate Cloth  6 each Topical Daily  . diclofenac Sodium  2 g Topical QID  . docusate sodium  100 mg Oral BID  . feeding supplement (ENSURE ENLIVE)  237 mL Oral BID BM  . ferrous sulfate  325 mg Oral BID WC  . folic acid  1 mg Oral Daily  . gabapentin  300 mg Oral TID  . insulin aspart  0-6 Units Subcutaneous TID AC & HS  . magnesium oxide  400 mg Oral BID  . metoprolol succinate  200 mg Oral Daily  . morphine  15 mg Oral Q12H  . multivitamin with minerals  1 tablet Oral Daily  . naproxen sodium  550 mg Oral BID WC  . pantoprazole  40 mg Oral Daily  . psyllium  1 packet Oral Daily  . QUEtiapine  25 mg Oral BID  . sodium chloride flush  10-40 mL Intracatheter Q12H  . thiamine  100 mg Oral Daily   Continuous Infusions: . vancomycin 1,250 mg (09/04/19 2204)     LOS: 54 days   Time spent: 30 minutes   Shelda Pal, DO [facility] 09/05/2019, 10:28 AM   Available via Epic secure chat 7am-7pm After these hours, please refer to coverage provider listed on [email or web address]

## 2019-09-06 LAB — CBC
HCT: 31.3 % — ABNORMAL LOW (ref 39.0–52.0)
Hemoglobin: 9.3 g/dL — ABNORMAL LOW (ref 13.0–17.0)
MCH: 27 pg (ref 26.0–34.0)
MCHC: 29.7 g/dL — ABNORMAL LOW (ref 30.0–36.0)
MCV: 91 fL (ref 80.0–100.0)
Platelets: 300 10*3/uL (ref 150–400)
RBC: 3.44 MIL/uL — ABNORMAL LOW (ref 4.22–5.81)
RDW: 14.8 % (ref 11.5–15.5)
WBC: 6.8 10*3/uL (ref 4.0–10.5)
nRBC: 0 % (ref 0.0–0.2)

## 2019-09-06 LAB — BASIC METABOLIC PANEL
Anion gap: 9 (ref 5–15)
BUN: 24 mg/dL — ABNORMAL HIGH (ref 6–20)
CO2: 29 mmol/L (ref 22–32)
Calcium: 9.4 mg/dL (ref 8.9–10.3)
Chloride: 101 mmol/L (ref 98–111)
Creatinine, Ser: 0.62 mg/dL (ref 0.61–1.24)
GFR calc Af Amer: >60 mL/min (ref >60)
GFR calc non Af Amer: >60 mL/min (ref >60)
Glucose, Bld: 118 mg/dL — ABNORMAL HIGH (ref 70–99)
Potassium: 4.4 mmol/L (ref 3.5–5.1)
Sodium: 139 mmol/L (ref 135–145)

## 2019-09-06 LAB — GLUCOSE, CAPILLARY
Glucose-Capillary: 117 mg/dL — ABNORMAL HIGH (ref 70–99)
Glucose-Capillary: 134 mg/dL — ABNORMAL HIGH (ref 70–99)
Glucose-Capillary: 132 mg/dL — ABNORMAL HIGH (ref 70–99)
Glucose-Capillary: 141 mg/dL — ABNORMAL HIGH (ref 70–99)

## 2019-09-06 MED ORDER — DOXYCYCLINE HYCLATE 100 MG PO TABS
100.0000 mg | ORAL_TABLET | Freq: Two times a day (BID) | ORAL | Status: DC
  Administered 2019-09-07: 100 mg via ORAL
  Filled 2019-09-06: qty 1

## 2019-09-06 NOTE — Progress Notes (Signed)
PROGRESS NOTE    Vernon Frye  [id number] DOB: 22-Oct-1960 DOA: 07/12/2019 PCP: Nicholes Rough, PA-C     Brief Narrative:  59 year old male presented on 12/15 for left knee pain and swelling.  Found to have septic arthritis and MRSA bacteremia.  He did require intubation.  He is currently on vancomycin IV.  On 12/29, he had a revision of the left knee. He needed SNF but does not have insurance so he is here through 12/9 receiving IV antibiotics.  He was found to have new onset atrial fibrillation with rapid ventricular response and seen by cardiology.  Had DCCV on 08/02/19 and improved.  Transferred to the [facility] team on 1/6 after his respiratory failure improved and he no longer required intubation.  He has required high amounts of narcotics to control his pain.  He started physical therapy on 1/18.  Limited progress.   New events last 24 hours / Subjective: Received last dose of vancomycin today.  Spoke with ID yesterday who recommended rifampin and doxycycline.  Given some of the medicines and hepatic function, the pharmacy team recommended doxycycline alone.  Pt feels about the same. has questions for ortho team. [name] plans to call the patient today to answer questions.  Assessment & Plan:   Principal Problem:   Septic joint ([place])  Will start doxy 100 mg bid tomorrow  F/u w ID in 3-4 weeks  F/u w ortho in 2-3 weeks  Home health/PT  Appreciate therapy recs  Stop Dilaudid, PO pain meds only, will need to f/u w pain team outpatient after ortho  Active Problems:   Atrial fibrillation with rapid ventricular response (HCC)             Eliquis 5 mg twice daily             Amiodarone 200 mg daily             Appreciate prior input from cardiology    MRSA bacteremia             Vancomycin as above    Prosthetic joint infection ([place])             As above    Acute bilateral low back pain without sciatica             Gabapentin 300 mg 3 times daily    Malnutrition of  moderate degree  Nutrition Problem: Moderate Malnutrition Etiology: social / environmental circumstances   DVT prophylaxis: Eliquis  Code Status: Full Family Communication: Self Coming From: Home Disposition Plan: Home with home health Barriers to Discharge: Awaiting final recommendations from orthopedic surgery  Consultants:   Orthopedic surgery  CCM  Cardiology  ID   Procedures:   PICC line placement 07/29/19  Left knee aspiration at bedside 07/13/2019 per [name]  Irrigation and debridement left total knee with polyexchange per [name] 07/13/2019  12/29 L total knee revision  DCCV 08/02/19  Antimicrobials:  Anti-infectives (From admission, onward)   Start     Dose/Rate Route Frequency Ordered Stop   08/27/19 1800  vancomycin (VANCOREADY) IVPB 1250 mg/250 mL     1,250 mg 125 mL/hr over 120 Minutes Intravenous Every 12 hours 08/27/19 0738 09/06/19 2359   08/21/19 1830  vancomycin (VANCOCIN) IVPB 1000 mg/200 mL premix  Status:  Discontinued     1,000 mg 100 mL/hr over 120 Minutes Intravenous Every 12 hours 08/21/19 1802 08/27/19 0738   08/19/19 1800  vancomycin (VANCOREADY) IVPB 750 mg/150 mL  Status:  Discontinued     750 mg 75 mL/hr over 120 Minutes Intravenous Every 12 hours 08/19/19 0822 08/21/19 1802   08/18/19 1630  vancomycin (VANCOREADY) IVPB 750 mg/150 mL  Status:  Discontinued     750 mg 150 mL/hr over 60 Minutes Intravenous Every 12 hours 08/18/19 1608 08/19/19 0822   08/16/19 0300  vancomycin (VANCOCIN) IVPB 1000 mg/200 mL premix  Status:  Discontinued     1,000 mg 50 mL/hr over 240 Minutes Intravenous Every 12 hours 08/15/19 1526 08/18/19 1608   08/01/19 0000  vancomycin IVPB  Status:  Discontinued     1,250 mg Intravenous Every 12 hours 08/01/19 1320 08/22/19    07/26/19 1505  tobramycin (NEBCIN) powder  Status:  Discontinued       As needed 07/26/19 1506 07/26/19 1603   07/26/19 1504  vancomycin (VANCOCIN) powder  Status:  Discontinued       As  needed 07/26/19 1505 07/26/19 1603   07/25/19 1500  vancomycin (VANCOREADY) IVPB 1250 mg/250 mL  Status:  Discontinued     1,250 mg 62.5 mL/hr over 240 Minutes Intravenous Every 12 hours 07/25/19 1443 08/15/19 1525   07/22/19 1000  vancomycin (VANCOREADY) IVPB 1500 mg/300 mL  Status:  Discontinued     1,500 mg 75 mL/hr over 240 Minutes Intravenous Every 12 hours 07/22/19 0237 07/25/19 1443   07/19/19 1300  vancomycin (VANCOCIN) IVPB 1000 mg/200 mL premix  Status:  Discontinued     1,000 mg 66.7 mL/hr over 180 Minutes Intravenous Every 12 hours 07/19/19 1145 07/22/19 0236   07/14/19 2000  DAPTOmycin (CUBICIN) 1,000 mg in sodium chloride 0.9 % IVPB  Status:  Discontinued     1,000 mg 240 mL/hr over 30 Minutes Intravenous Daily 07/14/19 0807 07/19/19 1145   07/13/19 2119  vancomycin (VANCOCIN) powder  Status:  Discontinued       As needed 07/13/19 2119 07/13/19 2154   07/13/19 1400  DAPTOmycin (CUBICIN) 944 mg in sodium chloride 0.9 % IVPB  Status:  Discontinued     8 mg/kg  118 kg 237.8 mL/hr over 30 Minutes Intravenous Daily 07/13/19 1320 07/14/19 0807   07/13/19 1015  piperacillin-tazobactam (ZOSYN) IVPB 3.375 g  Status:  Discontinued     3.375 g 12.5 mL/hr over 240 Minutes Intravenous Every 8 hours 07/13/19 1002 07/13/19 1535   07/13/19 0400  cefTRIAXone (ROCEPHIN) 2 g in sodium chloride 0.9 % 100 mL IVPB  Status:  Discontinued     2 g 200 mL/hr over 30 Minutes Intravenous Every 24 hours 07/13/19 0337 07/13/19 0951   07/13/19 0400  vancomycin (VANCOCIN) 2,000 mg in sodium chloride 0.9 % 500 mL IVPB     2,000 mg 250 mL/hr over 120 Minutes Intravenous  Once 07/13/19 0339 07/13/19 0707   07/13/19 0339  vancomycin variable dose per unstable renal function (pharmacist dosing)  Status:  Discontinued      Does not apply See admin instructions 07/13/19 0340 07/13/19 0911        Objective: Vitals:   09/05/19 1834 09/05/19 2212 09/06/19 0500 09/06/19 0628  BP: 126/80 130/82  116/83   Pulse: 68 72  61  Resp: 20 19  18   Temp: 98.5 F (36.9 C) 98.2 F (36.8 C)  98.4 F (36.9 C)  TempSrc: Oral Oral  Oral  SpO2: 100% 100%  99%  Weight:   111.5 kg   Height:        Intake/Output Summary (Last 24 hours) at 09/06/2019 1257 Last data filed  at 09/06/2019 1208 Gross per 24 hour  Intake 1327 ml  Output 2650 ml  Net -1323 ml   Filed Weights   09/01/19 0500 09/02/19 0500 09/06/19 0500  Weight: 111.2 kg 110.3 kg 111.5 kg    Examination:  General exam: Appears calm and comfortable  Respiratory system: Clear to auscultation. Respiratory effort normal. No respiratory distress. No conversational dyspnea.  Cardiovascular system: S1 & S2 heard, RRR. No murmurs. No pedal edema. Gastrointestinal system: Abdomen is nondistended, soft and nontender. Normal bowel sounds heard. Central nervous system: Alert and oriented. No focal neurological deficits. Speech clear.  Extremities: Symmetric in appearance, wrap on lower L knee; exposed area without erythema or excessive warmth Skin: No rashes, lesions or ulcers on exposed skin  Psychiatry: Judgement and insight appear normal. Mood & affect appropriate.   Data Reviewed: I have personally reviewed following labs and imaging studies  CBC: Recent Labs  Lab 09/01/19 0416 09/04/19 0348 09/05/19 1456 09/06/19 0519  WBC 7.9 7.0 7.0 6.8  HGB 9.1* 9.2* 8.8* 9.3*  HCT 30.3* 30.7* 29.5* 31.3*  MCV 91.0 90.6 91.3 91.0  PLT 308 309 274 [id number]   Basic Metabolic Panel: Recent Labs  Lab 09/01/19 0416 09/04/19 0348 09/05/19 1456 09/06/19 0519  NA 139 139 135 139  K 4.2 4.2 3.9 4.4  CL 99 100 98 101  CO2 29 29 26 29   GLUCOSE 148* 156* 156* 118*  BUN 21* 21* 25* 24*  CREATININE 0.70 0.70 0.73 0.62  CALCIUM 9.2 9.3 8.6* 9.4   CBG: Recent Labs  Lab 09/05/19 1235 09/05/19 1707 09/05/19 2209 09/06/19 0736 09/06/19 1200  GLUCAP 115* 110* 113* 117* 134*   Scheduled Meds: . acetaminophen  650 mg Oral Q6H  . amiodarone  200 mg Oral  Daily  . apixaban  5 mg Oral BID  . chlorhexidine  15 mL Mouth Rinse BID  . Chlorhexidine Gluconate Cloth  6 each Topical Daily  . diclofenac Sodium  2 g Topical QID  . docusate sodium  100 mg Oral BID  . feeding supplement (ENSURE ENLIVE)  237 mL Oral BID BM  . ferrous sulfate  325 mg Oral BID WC  . folic acid  1 mg Oral Daily  . gabapentin  300 mg Oral TID  . insulin aspart  0-6 Units Subcutaneous TID AC & HS  . magnesium oxide  400 mg Oral BID  . metoprolol succinate  200 mg Oral Daily  . morphine  15 mg Oral Q12H  . multivitamin with minerals  1 tablet Oral Daily  . naproxen sodium  550 mg Oral BID WC  . pantoprazole  40 mg Oral Daily  . psyllium  1 packet Oral Daily  . QUEtiapine  25 mg Oral BID  . sodium chloride flush  10-40 mL Intracatheter Q12H  . thiamine  100 mg Oral Daily   Continuous Infusions: . vancomycin 1,250 mg (09/06/19 1118)     LOS: 55 days    Time spent: 25 minutes   Shelda Pal, DO [facility] 09/06/2019, 12:57 PM   Available via Epic secure chat 7am-7pm After these hours, please refer to coverage provider listed on [email or web address]

## 2019-09-06 NOTE — TOC Progression Note (Signed)
Transition of Care [facility]) - Progression Note    Patient Details  Name: Vernon Frye MRN: [id number] Date of Birth: [birth year]  Transition of Care [facility] CM/SW Contact  Jacalyn Lefevre Edson Snowball, RN Phone Number: 09/06/2019, 4:37 PM  Clinical Narrative:     Discussed discharge planning again with patient. Patient has walker and 3 in 1 at home. Ordered wheel chair with [facility] with [facility]. They will begin processing orders today. They need to complete financial information with patient.   Patient has transportation home tomorrow.   Expected Discharge Plan: [place] Barriers to Discharge: Equipment Delay(JUst ordered today at 1615)  Expected Discharge Plan and Services Expected Discharge Plan: [place]   Discharge Planning Services: CM Consult Post Acute Care Choice: Home Health, Durable Medical Equipment Living arrangements for the past 2 months: Single Family Home                 DME Arranged: Wheelchair manual DME Agency: [employer] Date DME Agency Contacted: 09/06/19 Time DME Agency Contacted: [phone] Representative spoke with at DME Agency: [place]: PT [place]: [place] ([facility]) Date [place]: 09/06/19 Time [place]: 1636 Representative spoke with at [place]: [place] ([place]) Interventions    Readmission Risk Interventions No flowsheet data found.

## 2019-09-06 NOTE — Progress Notes (Signed)
Patient requested that he receive Dilaudid prior to dressing change of the knee; Informed patient that dilaudid was discontinued by the physician and I would administer oxycodone around the time I would perform the dressing change; When I arrive to patient's room at 1240 patient became verbally aggressive stating "I asked you to give me my pain shot hours ago" informed patient again the dilaudid was discontinued by the physician and I had the oxycodone with me so I could perform the dressing change; Patient refused dressing change and stated he would like to speak with orthopedic physician and AD-Joyce. Will continue to monitor.

## 2019-09-06 NOTE — Care Management (Signed)
    Durable Medical Equipment  (From admission, onward)         Start     Ordered   09/06/19 1613  For home use only DME lightweight manual wheelchair with seat cushion  Once    Comments: Patient suffers from  Septic joint  which impairs their ability to perform daily activities like ambulating  in the home.  A cane   will not resolve  issue with performing activities of daily living. A wheelchair will allow patient to safely perform daily activities. Patient is not able to propel themselves in the home using a standard weight wheelchair due to  Septic joint . Patient can self propel in the lightweight wheelchair. Length of need lifetime  Accessories: elevating leg rests (ELRs), wheel locks, extensions and anti-tippers.   09/06/19 1613

## 2019-09-06 NOTE — Progress Notes (Signed)
Nutrition Follow-up  RD working remotely.  DOCUMENTATION CODES:   Non-severe (moderate) malnutrition in context of social or environmental circumstances  INTERVENTION:   -ContinueEnsure Enlive po BID, each supplement provides 350 kcal and 20 grams of protein -Continue MVI with minerals daily  NUTRITION DIAGNOSIS:   Moderate Malnutrition related to social / environmental circumstances as evidenced by percent weight loss, mild muscle depletion.  Ongoing  GOAL:   Patient will meet greater than or equal to 90% of their needs  Progressing   MONITOR:   PO intake, Supplement acceptance, Labs, Weight trends, Skin, I & O's  REASON FOR ASSESSMENT:   Ventilator, Consult Enteral/tube feeding initiation and management  ASSESSMENT:   Patient with PMH significant for HTN, L TKR (2016), and Afib. Presents this admission with septic arthritis of L new and new onset AF with RVR.  12/16- s/p I&D total L knee  12/29- s/p total L knee revision/replacement 1/3- extubated 1/5- s/p TEE- no vegetations; successful DCCV  Reviewed I/O's: -353 ml x 24 hours and -12.3 L since 08/23/19  UOP: 1.8 L x 24 hours   Pt remains with good appetite, consuming 100% of meals. Pt is also consuming Ensure supplements.  Pt withincreased nutrient needsand would benefit from nutrient dense supplement. One Ensure Enlive supplement provides 350 kcals, 20 grams protein, and 44-45 grams of carbohydrate vs one Glucerna shake supplement, which provides 220 kcals, 10 grams of protein, and 26 grams of carbohydrate. Given pt's hx of DM, RD will continue to monitor PO intake, CBGS, and adjust supplement regimen as appropriate.  Wt has been stable over the past month.   Last dose of IV antibiotics scheduled for today. Pt with multiple barriers to discharge including lack of help at home, limited participation in therapies, and lack of insurance to pay for SNF stay. TOC team working on most appropriate discharge  disposition.   Medications reviewed and include colace, folvite, magnesium oxide, MVI, and thiamine.   Labs reviewed: CBGS: 110-117 (inpatient orders for glycemic control are 0-6 units insulin aspart TID with meals and q HS, ).   Diet Order:   Diet Order            Diet Carb Modified Fluid consistency: Thin; Room service appropriate? Yes with Assist  Diet effective now              EDUCATION NEEDS:   Not appropriate for education at this time  Skin:  Skin Assessment: Skin Integrity Issues: Skin Integrity Issues:: Incisions, Other (Comment) Incisions: L knee/L leg Other: MASD-buttocks, scrotum  Last BM:  09/05/19  Height:   Ht Readings from Last 1 Encounters:  07/13/19 6\' 3"  (1.905 m)    Weight:   Wt Readings from Last 1 Encounters:  09/06/19 111.5 kg    Ideal Body Weight:  89.1 kg  BMI:  Body mass index is 30.72 kg/m.  Estimated Nutritional Needs:   Kcal:  2300-2500 kcal  Protein:  115-130 grams  Fluid:  >/= 2.3 L/day    Loistine Chance, RD, LDN, CDCES Registered Dietitian II Certified Diabetes Care and Education Specialist Please refer to [facility] for RD and/or RD on-call/weekend/after hours pager

## 2019-09-07 DIAGNOSIS — A419 Sepsis, unspecified organism: Secondary | ICD-10-CM

## 2019-09-07 HISTORY — DX: Sepsis, unspecified organism: A41.9

## 2019-09-07 LAB — CBC
HCT: 31.6 % — ABNORMAL LOW (ref 39.0–52.0)
Hemoglobin: 9.6 g/dL — ABNORMAL LOW (ref 13.0–17.0)
MCH: 27.4 pg (ref 26.0–34.0)
MCHC: 30.4 g/dL (ref 30.0–36.0)
MCV: 90 fL (ref 80.0–100.0)
Platelets: 313 10*3/uL (ref 150–400)
RBC: 3.51 MIL/uL — ABNORMAL LOW (ref 4.22–5.81)
RDW: 14.6 % (ref 11.5–15.5)
WBC: 7.4 10*3/uL (ref 4.0–10.5)
nRBC: 0 % (ref 0.0–0.2)

## 2019-09-07 LAB — BASIC METABOLIC PANEL
Anion gap: 10 (ref 5–15)
BUN: 21 mg/dL — ABNORMAL HIGH (ref 6–20)
CO2: 28 mmol/L (ref 22–32)
Calcium: 9.1 mg/dL (ref 8.9–10.3)
Chloride: 100 mmol/L (ref 98–111)
Creatinine, Ser: 0.7 mg/dL (ref 0.61–1.24)
GFR calc Af Amer: >60 mL/min (ref >60)
GFR calc non Af Amer: >60 mL/min (ref >60)
Glucose, Bld: 116 mg/dL — ABNORMAL HIGH (ref 70–99)
Potassium: 4.2 mmol/L (ref 3.5–5.1)
Sodium: 138 mmol/L (ref 135–145)

## 2019-09-07 LAB — GLUCOSE, CAPILLARY
Glucose-Capillary: 108 mg/dL — ABNORMAL HIGH (ref 70–99)
Glucose-Capillary: 164 mg/dL — ABNORMAL HIGH (ref 70–99)

## 2019-09-07 MED ORDER — AMIODARONE HCL 200 MG PO TABS: 200.0000 mg | ORAL_TABLET | Freq: Every day | ORAL | 0 refills | Status: AC

## 2019-09-07 MED ORDER — OXYCODONE HCL 10 MG PO TABS: 5.0000 mg | ORAL_TABLET | Freq: Four times a day (QID) | ORAL | 0 refills | Status: AC | PRN

## 2019-09-07 MED ORDER — OXYCODONE HCL ER 20 MG PO T12A: 20.0000 mg | EXTENDED_RELEASE_TABLET | Freq: Two times a day (BID) | ORAL | 0 refills | Status: AC

## 2019-09-07 MED ORDER — ACETAMINOPHEN 325 MG PO TABS: 650.0000 mg | ORAL_TABLET | Freq: Four times a day (QID) | ORAL | Status: DC

## 2019-09-07 MED ORDER — APIXABAN 5 MG PO TABS: 5.0000 mg | ORAL_TABLET | Freq: Two times a day (BID) | ORAL | 1 refills | Status: AC

## 2019-09-07 MED ORDER — DOXYCYCLINE HYCLATE 100 MG PO TABS: 100.0000 mg | ORAL_TABLET | Freq: Two times a day (BID) | ORAL | 1 refills | Status: DC

## 2019-09-07 MED ORDER — FENTANYL CITRATE (PF) 100 MCG/2ML IJ SOLN
12.5000 ug | Freq: Once | INTRAMUSCULAR | Status: AC
  Administered 2019-09-07: 12.5 ug via INTRAVENOUS
  Filled 2019-09-07: qty 2

## 2019-09-07 MED FILL — oxyCODONE HCL 10 MG TABS: 10 | 7 days supply | Qty: 28 | Fill #0

## 2019-09-07 MED FILL — OxyCONTIN 20 MG T12A: 20 | 7 days supply | Qty: 14 | Fill #0

## 2019-09-07 MED FILL — AMIODARONE HCL 200 MG TAB: 200 | 30 days supply | Qty: 30 | Fill #0

## 2019-09-07 MED FILL — DOXYCYCLINE HYCLATE 100 MG: 100 | 30 days supply | Qty: 60 | Fill #0

## 2019-09-07 NOTE — Progress Notes (Signed)
Physical Therapy Treatment Patient Details Name: Vernon Frye MRN: [id number] DOB: February 14, 1961 Today's Date: 09/07/2019    History of Present Illness Pt is a 59 y/o M PMH Afib, HTN, L TKR (2016) who presented to ED with Lt knee pain. 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 Desaturated with hypoxemic respiratory failure requiring 3LNC. Pt underwent L TKA removal with placement of antibiotic spacers on 12/29, agitated after procedure and unable to be extubated. Extubated 07/31/19.    PT Comments    Pt seen for mobility progression and was premedicated 30-45 minutes prior to arrival. He continues to be significantly limited secondary to pain and weakness. He insisted on practicing crutches as he felt he would do better with standing and walking versus using RW despite PT's explanation of the improved stability and support of a RW versus crutches. Pt required heavy mod A x2 to stand with cueing for appropriate technique with crutches. He then agreed that using a RW would be better at this time. He continues to demonstrate poor safety awareness, STM deficits and difficulty with maintaining TDWB'ing L LE without cueing. Plan is for pt to d/c home today with HHPT despite recommendations for SNF. Pt would continue to benefit from skilled physical therapy services at this time while admitted and after d/c to address the below listed limitations in order to improve overall safety and independence with functional mobility.    Follow Up Recommendations  SNF;Supervision/Assistance - 24 hour     Equipment Recommendations  Wheelchair (measurements PT);Wheelchair cushion (measurements PT)    Recommendations for Other Services       Precautions / Restrictions Precautions Precautions: Fall Required Braces or Orthoses: Knee Immobilizer - Left Knee Immobilizer - Left: On at all times Restrictions Weight Bearing Restrictions:  Yes RUE Weight Bearing: Weight bearing as tolerated LUE Weight Bearing: Weight bearing as tolerated LLE Weight Bearing: Touchdown weight bearing Other Position/Activity Restrictions: No L knee ROM    Mobility  Bed Mobility Overal bed mobility: Needs Assistance Bed Mobility: Supine to Sit     Supine to sit: Min guard     General bed mobility comments: pt continuing to use bed rails for external support  Transfers Overall transfer level: Needs assistance Equipment used: Rolling walker (2 wheeled);Crutches Transfers: Sit to/from Stand Sit to Stand: Min assist;+2 physical assistance;Mod assist         General transfer comment: pt insistent on trying crutches, needind heavy mod A +2 to safely come to standing. Switched to RW with heavy min A +2 to stand with bed in elevated position  Ambulation/Gait                 Stairs             Wheelchair Mobility    Modified Rankin (Stroke Patients Only)       Balance Overall balance assessment: Needs assistance Sitting-balance support: Feet supported Sitting balance-Leahy Scale: Good Sitting balance - Comments: sitting EOB with supervision   Standing balance support: Bilateral upper extremity supported;During functional activity Standing balance-Leahy Scale: Poor Standing balance comment: reliant on external support                            Cognition Arousal/Alertness: Awake/alert Behavior During Therapy: WFL for tasks assessed/performed Overall Cognitive Status: Impaired/Different from baseline Area of Impairment: Memory;Safety/judgement  Memory: Decreased short-term memory;Decreased recall of precautions   Safety/Judgement: Decreased awareness of safety;Decreased awareness of deficits     General Comments: continues to present with decreased awarenes of safety and deficits; insistent on using crutches for home when he already struggles with RW      Exercises       General Comments        Pertinent Vitals/Pain Pain Assessment: Faces Faces Pain Scale: Hurts little more Pain Location: L knee Pain Descriptors / Indicators: Aching;Sore;Guarding Pain Intervention(s): Monitored during session;Repositioned;Premedicated before session    Home Living                      Prior Function            PT Goals (current goals can now be found in the care plan section) Acute Rehab PT Goals Patient Stated Goal: get stronger PT Goal Formulation: With patient Time For Goal Achievement: 09/19/19 Potential to Achieve Goals: Fair Progress towards PT goals: Progressing toward goals    Frequency    Min 3X/week      PT Plan Current plan remains appropriate    Co-evaluation PT/OT/SLP Co-Evaluation/Treatment: Yes Reason for Co-Treatment: To address functional/ADL transfers;For patient/therapist safety PT goals addressed during session: Mobility/safety with mobility OT goals addressed during session: ADL's and self-care;Proper use of Adaptive equipment and DME;Strengthening/ROM      AM-PAC PT "6 Clicks" Mobility   Outcome Measure  Help needed turning from your back to your side while in a flat bed without using bedrails?: A Little Help needed moving from lying on your back to sitting on the side of a flat bed without using bedrails?: A Little Help needed moving to and from a bed to a chair (including a wheelchair)?: A Little Help needed standing up from a chair using your arms (e.g., wheelchair or bedside chair)?: A Lot Help needed to walk in hospital room?: A Lot Help needed climbing 3-5 steps with a railing? : Total 6 Click Score: 14    End of Session Equipment Utilized During Treatment: Left knee immobilizer;Gait belt Activity Tolerance: Patient tolerated treatment well Patient left: in bed;with call bell/phone within reach;Other (comment)(seated EOB) Nurse Communication: Mobility status;Precautions;Weight bearing status PT  Visit Diagnosis: Other abnormalities of gait and mobility (R26.89);Pain;Muscle weakness (generalized) (M62.81);Difficulty in walking, not elsewhere classified (R26.2) Pain - Right/Left: Left Pain - part of body: Knee     Time: [id number] PT Time Calculation (min) (ACUTE ONLY): 39 min  Charges:  $Therapeutic Activity: 23-37 mins                     Anastasio Champion, DPT  Acute Rehabilitation Services Pager [phone] Office [place] 09/07/2019, 3:30 PM

## 2019-09-07 NOTE — Discharge Summary (Signed)
Physician Discharge Summary  Vernon Frye [id number] DOB: Nov 16, 1960 DOA: 07/12/2019  PCP: Nicholes Rough, PA-C  Admit date: 07/12/2019 Discharge date: 09/07/2019  Admitted From: Home Disposition:  [place]  Recommendations for Outpatient Follow-up:  1. Follow up with PCP in 1 week 2. Follow up with Orthopedic surgery in 2 weeks 3. Follow up with Infectious Disease in 3 weeks 4. Follow up with Cardiology in 4 weeks 5. Follow up with pain management in 2-3 weeks 6. Please obtain BMP/CBC in 1 week    Home Health: Physical therapy  Equipment/Devices: Wheelchair, knee brace   Discharge Condition: Good CODE STATUS: Full Diet recommendation: Heart healthy  Brief/Interim Summary: 59 year old male with a past medical history of chronic pain, anxiety, hypertension, and a left knee replacement in 2016 presented with swelling and pain to the left knee on 07/12/2019.  Symptoms started the day before admission.  Vancomycin was started empirically for suspected septic joint.  During this administration and volume resuscitation, the patient became erythematous with a blanching facial rash and oxygen saturations dropping.  The critical care team was consulted for this.  The orthopedic surgery team was also consulted for the suspected septic joint and aspirated the joint and brought him to the operating room for septic knee replacement.  Additionally, he was found to be in atrial fibrillation with rapid ventricular response noncompliant with outpatient medicine due to cost.  He was started on an amiodarone drip.  Patient had issues with encephalopathy requiring Precedex infusion, blood cultures came back positive for MRSA.  Seroquel was started for this. He was on daptomycin throughout his ICU stay.  The infectious disease team was consulted on 12/17 and recommended 6 weeks of IV therapy with a minimum of 6 months of oral therapy.  Re-trialed on IV Vanco slowly due to prior [facility]  Syndrome w initial infusion on 12/22. This was successful.  Transferred out of the ICU on 12/23.  He was no longer requiring oxygen and tolerating IV vancomycin well.  Mental status was was also improved.  There was difficulty clearing the infection and he was taken back to the operating room on 12/29 with orthopedic surgery team.  They did a total knee revision removal of the knee replacement and antibiotic spacer placement.  He was transferred back to the ICU intubated.  He remained in the ICU until being extubated on 07/31/2019.  He was then transferred out of the unit on 08/03/2019.  A PICC line was placed on 07/29/2019.  Successful DCCV w cardiology team on 08/02/19.  SNF was recommended but due to no insurance, the patient declined.  He remained in the hospital receiving IV antibiotics through 2/9.  He was reluctant to proceed with therapy secondary to pain.  He required frequent administrations of IV pain medication.  Subjective on day of discharge: The patient spoke with the orthopedic team yesterday and all his questions were answered.  Physical therapy set up at home.  He has DME equipment prepared.  He understands that he will need to take antibiotics possibly up to 6 months.  He has a follow-up with the infectious disease team in about 3 weeks and his orthopedic surgery team at around 2 weeks.  He will follow-up with his PCP within the next week.  All questions answered.  Discharge Diagnoses:  Principal Problem:   Septic joint ([place]) Active Problems:   Atrial fibrillation with rapid ventricular response (HCC)   Acute pain of left knee   MRSA bacteremia  Prosthetic joint infection (HCC)   Acute bilateral low back pain without sciatica   S/P revision of total knee   Malnutrition of moderate degree  Nutrition Problem: Moderate Malnutrition Etiology: social / environmental circumstances  Discharge Instructions  Discharge Instructions    Call MD for:  difficulty breathing, headache or  visual disturbances   Complete by: As directed    Call MD for:  redness, tenderness, or signs of infection (pain, swelling, redness, odor or green/yellow discharge around incision site)   Complete by: As directed    Call MD for:  severe uncontrolled pain   Complete by: As directed    Call MD for:  temperature >100.4   Complete by: As directed    Diet - low sodium heart healthy   Complete by: As directed    Home infusion instructions   Complete by: As directed    Instructions: Flushing of vascular access device: 0.9% NaCl pre/post medication administration and prn patency; Heparin 100 u/ml, 8ml for implanted ports and Heparin 10u/ml, 62ml for all other central venous catheters.   Increase activity slowly   Complete by: As directed    Increase activity slowly   Complete by: As directed      Allergies as of 09/07/2019      Reactions   Vancomycin Shortness Of Breath, Rash   [name] . Tolerated at lower rate during hospitalization 12/20.      Medication List    STOP taking these medications   aspirin 325 MG tablet   ibuprofen 200 MG tablet Commonly known as: ADVIL   morphine 15 MG 12 hr tablet Commonly known as: MS CONTIN   naproxen 500 MG tablet Commonly known as: NAPROSYN   Percocet 5-325 MG tablet Generic drug: oxyCODONE-acetaminophen     TAKE these medications   acetaminophen 325 MG tablet Commonly known as: TYLENOL Take 2 tablets (650 mg total) by mouth every 6 (six) hours.   ALPRAZolam 1 MG tablet Commonly known as: XANAX Take 1 tablet (1 mg total) by mouth daily as needed for anxiety or sleep.   amiodarone 200 MG tablet Commonly known as: PACERONE Take 1 tablet (200 mg total) by mouth daily.   apixaban 5 MG Tabs tablet Commonly known as: ELIQUIS Take 1 tablet (5 mg total) by mouth 2 (two) times daily.   apixaban 5 MG Tabs tablet Commonly known as: ELIQUIS Take 1 tablet (5 mg total) by mouth 2 (two) times daily.   doxycycline 100 MG  tablet Commonly known as: VIBRA-TABS Take 1 tablet (100 mg total) by mouth every 12 (twelve) hours.   lisinopril-hydrochlorothiazide 20-12.5 MG tablet Commonly known as: ZESTORETIC Take 2 tablets by mouth daily.   metoprolol succinate 100 MG 24 hr tablet Commonly known as: TOPROL-XL Take 200 mg by mouth daily.   Oxycodone HCl 10 MG Tabs Take 0.5-1 tablets (5-10 mg total) by mouth every 6 (six) hours as needed for up to 14 days (Mod-severe pain).   oxyCODONE 20 mg 12 hr tablet Commonly known as: OxyCONTIN Take 1 tablet (20 mg total) by mouth every 12 (twelve) hours.   Pennsaid 2 % Soln Generic drug: Diclofenac Sodium Apply 2 Pump topically 2 (two) times daily as needed for pain. What changed: Another medication with the same name was removed. Continue taking this medication, and follow the directions you see here.   polyvinyl alcohol 1.4 % ophthalmic solution Commonly known as: LIQUIFILM TEARS Place 1 drop into both eyes as needed for dry eyes.  psyllium 95 % Pack Commonly known as: HYDROCIL/METAMUCIL Take 1 packet by mouth daily as needed for mild constipation.            Home Infusion Instuctions  (From admission, onward)         Start     Ordered   08/01/19 0000  Home infusion instructions    Question:  Instructions  Answer:  Flushing of vascular access device: 0.9% NaCl pre/post medication administration and prn patency; Heparin 100 u/ml, 10ml for implanted ports and Heparin 10u/ml, 25ml for all other central venous catheters.   08/01/19 1320           Durable Medical Equipment  (From admission, onward)         Start     Ordered   09/06/19 1613  For home use only DME lightweight manual wheelchair with seat cushion  Once    Comments: Patient suffers from  Septic joint  which impairs their ability to perform daily activities like ambulating  in the home.  A cane   will not resolve  issue with performing activities of daily living. A wheelchair will allow  patient to safely perform daily activities. Patient is not able to propel themselves in the home using a standard weight wheelchair due to  Septic joint . Patient can self propel in the lightweight wheelchair. Length of need lifetime  Accessories: elevating leg rests (ELRs), wheel locks, extensions and anti-tippers.   09/06/19 1613         Follow-up Information    Burtis Junes, NP Follow up on 09/14/2019.   Specialties: Nurse Practitioner, Interventional Cardiology, Cardiology, Radiology Why: Please go to hospital follow-up February 17th at 10:15 AM Contact information: [place]. [address] [place] [address] [phone]        Paralee Cancel, MD Follow up in 1 week(s).   Specialty: Orthopedic Surgery Why: wound check Contact information: [address] [place] [address] [phone]        [facility] Follow up.   Specialty: Home Health Services         Allergies  Allergen Reactions  . Vancomycin Shortness Of Breath and Rash    [name] . Tolerated at lower rate during hospitalization 12/20.    Consultations:  CCM  Cardiology  Orthopedic surgery  Infectious disease   Procedures/Studies: DG Knee Complete 4 Views Right  Result Date: 08/26/2019 CLINICAL DATA:  Right knee pain. EXAM: RIGHT KNEE - COMPLETE 4+ VIEW COMPARISON:  None. FINDINGS: No evidence of fracture or dislocation. A large knee joint effusion is seen. Degenerative spurring is seen involving the patella and tibial spines. Mild chondrocalcinosis also seen. No other osseous abnormality identified. IMPRESSION: 1. Large knee joint effusion. 2. Mild degenerative spurring and chondrocalcinosis, suspicious for CPPD arthropathy. Electronically Signed   By: Marlaine Hind M.D.   On: 08/26/2019 19:21      Discharge Exam: Vitals:   09/06/19 2016 09/07/19 0640  BP: 134/88 126/88  Pulse: 64 61  Resp: 18 18  Temp: 98.2 F (36.8 C) 97.8 F (36.6  C)  SpO2: 99% 99%    General: Pt is alert, awake, not in acute distress Cardiovascular: RRR, S1/S2 +, no edema Respiratory: CTA bilaterally, no wheezing, no rhonchi, no respiratory distress, no conversational dyspnea  Extremities: no edema, no erythema, no excessive warmth Psych: Normal mood and affect, stable judgement and insight     The results of significant diagnostics from this hospitalization (including imaging,  microbiology, ancillary and laboratory) are listed below for reference.     Labs: BNP (last 3 results) Recent Labs    07/13/19 0431  BNP [id number]*   Basic Metabolic Panel: Recent Labs  Lab 09/01/19 0416 09/04/19 0348 09/05/19 1456 09/06/19 0519 09/07/19 0446  NA 139 139 135 139 138  K 4.2 4.2 3.9 4.4 4.2  CL 99 100 98 101 100  CO2 29 29 26 29 28   GLUCOSE 148* 156* 156* 118* 116*  BUN 21* 21* 25* 24* 21*  CREATININE 0.70 0.70 0.73 0.62 0.70  CALCIUM 9.2 9.3 8.6* 9.4 9.1   CBC: Recent Labs  Lab 09/01/19 0416 09/04/19 0348 09/05/19 1456 09/06/19 0519 09/07/19 0446  WBC 7.9 7.0 7.0 6.8 7.4  HGB 9.1* 9.2* 8.8* 9.3* 9.6*  HCT 30.3* 30.7* 29.5* 31.3* 31.6*  MCV 91.0 90.6 91.3 91.0 90.0  PLT 308 309 274 300 313   CBG: Recent Labs  Lab 09/06/19 0736 09/06/19 1200 09/06/19 1700 09/06/19 2220 09/07/19 0745  GLUCAP 117* 134* 132* 141* 108*   Urinalysis    Component Value Date/Time   COLORURINE YELLOW 07/13/2019 0818   APPEARANCEUR CLOUDY (A) 07/13/2019 0818   LABSPEC 1.020 07/13/2019 0818   PHURINE 5.0 07/13/2019 0818   GLUCOSEU NEGATIVE 07/13/2019 0818   HGBUR MODERATE (A) 07/13/2019 0818   BILIRUBINUR NEGATIVE 07/13/2019 0818   KETONESUR 15 (A) 07/13/2019 0818   PROTEINUR NEGATIVE 07/13/2019 0818   NITRITE NEGATIVE 07/13/2019 0818   LEUKOCYTESUR NEGATIVE 07/13/2019 0818    Patient was seen and examined on the day of discharge and was found to be in stable condition. Time coordinating discharge: 60 minutes including assessment and  coordination of care, as well as examination of the patient.   SIGNED:  Shelda Pal, DO [facility] 09/07/2019, 12:51 PM

## 2019-09-07 NOTE — Progress Notes (Signed)
Patient discharged to home with instructions, ordered medications and equipment.

## 2019-09-07 NOTE — Plan of Care (Signed)

## 2019-09-07 NOTE — Progress Notes (Signed)
Occupational Therapy Treatment Patient Details Name: Vernon Frye MRN: [id number] DOB: [birth year] Today's Date: 09/07/2019    History of present illness Pt is a 59 y/o M PMH Afib, HTN, L TKR (2016) who presented to ED with Lt knee pain. 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 Desaturated with hypoxemic respiratory failure requiring 3LNC. Pt underwent L TKA removal with placement of antibiotic spacers on 12/29, agitated after procedure and unable to be extubated. Extubated 07/31/19.   OT comments  Pt seen for OT f/u with focus on home d/c this date. Pt insistent on using crutches for d/c. Provided pt with crutches to practice, needing max sequencing cues and heavy mod A +2 to safely power up to standing. Educated pt on why RW is safest option. Pt completed sit <> stand with RW at min A +2 for steadying and safety. He continues to present with decreased awareness of deficits and how this may impact his safety going home alone. He continues to require max A for LB dressing. Educated pt on safe transfers and general home safety. SNF is first recommendation, but is not an option. Recommended HHOT but CM informed due to pt payor source HHPT will be going out first to conserve resources. Will continue to follow while acute.    Follow Up Recommendations  SNF;Supervision/Assistance - 24 hour;Other (comment) HHOT but per CM pt with limited resources and HHPT to come out first   Equipment Recommendations  3 in 1 bedside commode;Wheelchair (measurements OT);Wheelchair cushion (measurements OT)    Recommendations for Other Services      Precautions / Restrictions Precautions Precautions: Fall Required Braces or Orthoses: Knee Immobilizer - Left Knee Immobilizer - Left: On at all times Restrictions Weight Bearing Restrictions: Yes RUE Weight Bearing: Weight bearing as tolerated LUE Weight Bearing: Weight bearing as  tolerated LLE Weight Bearing: Touchdown weight bearing Other Position/Activity Restrictions: No L knee ROM       Mobility Bed Mobility Overal bed mobility: Needs Assistance Bed Mobility: Supine to Sit     Supine to sit: Min guard     General bed mobility comments: pt continuing to use bed rails for external support  Transfers Overall transfer level: Needs assistance Equipment used: Rolling walker (2 wheeled);Crutches Transfers: Sit to/from Stand Sit to Stand: Min assist;+2 physical assistance;Mod assist         General transfer comment: pt insistent on trying crutches, needind heavy mod A +2 to safely come to standing. Switched to RW with heavy min A +2 to stand    Balance Overall balance assessment: Needs assistance Sitting-balance support: Feet supported Sitting balance-Leahy Scale: Good Sitting balance - Comments: sitting EOB with supervision   Standing balance support: Bilateral upper extremity supported;During functional activity Standing balance-Leahy Scale: Poor Standing balance comment: reliant on external support                           ADL either performed or assessed with clinical judgement   ADL Overall ADL's : Needs assistance/impaired                     Lower Body Dressing: Maximal assistance;Sit to/from stand Lower Body Dressing Details (indicate cue type and reason): LLE is total A, max A with RLE to don socks Toilet Transfer: Moderate assistance;+2 for safety/equipment;+2 for physical assistance Toilet Transfer Details (indicate cue type and reason): mod A +2  to rise and steady with RW this date         Functional mobility during ADLs: Minimal assistance;Moderate assistance;+2 for physical assistance;+2 for safety/equipment;Rolling walker General ADL Comments: session focused on d/c planning     Vision Patient Visual Report: No change from baseline     Perception     Praxis      Cognition Arousal/Alertness:  Awake/alert Behavior During Therapy: WFL for tasks assessed/performed Overall Cognitive Status: Impaired/Different from baseline Area of Impairment: Memory;Safety/judgement                     Memory: Decreased short-term memory;Decreased recall of precautions   Safety/Judgement: Decreased awareness of safety;Decreased awareness of deficits     General Comments: continues to present with decreased awarenes of safety and deficits; insistent on using crutches for home when he already struggles with RW        Exercises     Shoulder Instructions       General Comments      Pertinent Vitals/ Pain       Pain Assessment: Faces Faces Pain Scale: Hurts little more Pain Location: L knee Pain Descriptors / Indicators: Aching;Sore;Guarding Pain Intervention(s): Limited activity within patient's tolerance;Monitored during session;Premedicated before session  Home Living                                          Prior Functioning/Environment              Frequency  Min 2X/week        Progress Toward Goals  OT Goals(current goals can now be found in the care plan section)  Progress towards OT goals: Progressing toward goals  Acute Rehab OT Goals Patient Stated Goal: get stronger OT Goal Formulation: With patient Time For Goal Achievement: 09/13/19 Potential to Achieve Goals: Good  Plan Discharge plan remains appropriate    Co-evaluation    PT/OT/SLP Co-Evaluation/Treatment: Yes Reason for Co-Treatment: For patient/therapist safety;To address functional/ADL transfers;Other (comment)(d/c planning) PT goals addressed during session: Mobility/safety with mobility OT goals addressed during session: ADL's and self-care;Proper use of Adaptive equipment and DME;Strengthening/ROM      AM-PAC OT "6 Clicks" Daily Activity     Outcome Measure   Help from another person eating meals?: None Help from another person taking care of personal grooming?:  A Little Help from another person toileting, which includes using toliet, bedpan, or urinal?: A Lot Help from another person bathing (including washing, rinsing, drying)?: A Lot Help from another person to put on and taking off regular upper body clothing?: A Little Help from another person to put on and taking off regular lower body clothing?: A Lot 6 Click Score: 16    End of Session Equipment Utilized During Treatment: Left knee immobilizer  OT Visit Diagnosis: Muscle weakness (generalized) (M62.81);Pain Pain - Right/Left: Left Pain - part of body: Knee;Leg   Activity Tolerance Patient tolerated treatment well   Patient Left in bed;with call bell/phone within reach;with bed alarm set   Nurse Communication Mobility status;Precautions;Weight bearing status        Time: [id number] OT Time Calculation (min): 37 min  Charges: OT General Charges $OT Visit: 1 Visit OT Treatments $Self Care/Home Management : 8-22 mins  Zenovia Jarred, MSOT, OTR/L Acute Rehabilitation Services [place] Office Number: [phone]  Zenovia Jarred 09/07/2019, 1:46 PM

## 2019-09-07 NOTE — TOC Progression Note (Signed)
Transition of Care [facility]) - Progression Note    Patient Details  Name: Vernon Frye MRN: [id number] Date of Birth: 01/01/1961  Transition of Care [facility]) CM/SW Contact  Jacalyn Lefevre Edson Snowball, RN Phone Number: 09/07/2019, 8:11 AM  Clinical Narrative:     Patient for discharge to today. See yesterday  TOC note. Wheel chair was ordered yesterday, and HHPT arranged with [facility].   Patient's pharmacy changed to Transitions of care pharmacy. Patient entered into [facility].   Patient has a friend providing transportation home.   Expected Discharge Plan: [place] Barriers to Discharge: Equipment Delay(JUst ordered today at 1615)  Expected Discharge Plan and Services Expected Discharge Plan: [place]   Discharge Planning Services: CM Consult Post Acute Care Choice: Home Health, Durable Medical Equipment Living arrangements for the past 2 months: Single Family Home                 DME Arranged: Wheelchair manual DME Agency: [employer] Date DME Agency Contacted: 09/06/19 Time DME Agency Contacted: [phone] Representative spoke with at DME Agency: [place]: PT [place]: [place] ([place]) Date [place]: 09/06/19 Time [place]: 1636 Representative spoke with at [place]: [place] ([place]) Interventions    Readmission Risk Interventions No flowsheet data found.

## 2019-09-08 NOTE — Progress Notes (Deleted)
CARDIOLOGY OFFICE NOTE  Date:  09/13/2019    [name] Date of Birth: 1961-05-25 Medical Record [id number]  PCP:  Nicholes Rough, PA-C  Cardiologist:  Zack Seal chief complaint on file.  History of Present Illness: Vernon Frye is a 59 y.o. male who presents today for a post hospital visit. Seen for Dr. Tamala Julian..   He has a history of chronic pain, anxiety, hypertension, and a left knee replacement in 2016.   He presented with swelling and pain to the left knee on 07/12/2019. Vancomycin was started empirically for suspected septic joint.  During this administration and volume resuscitation, the patient became erythematous with a blanching facial rash and oxygen saturations dropping.  The critical care team was consulted for this.  The orthopedic surgery team was also consulted for the suspected septic joint and aspirated the joint and brought him to the operating room for septic knee replacement - found to have MRSA - was to do 6 weeks of IV therapy with minimum of 6 months oral therapy.  Post op, he had AF with RVR - noted non compliant with outpatient medicines due to cost - he required IV amiodarone and ended up having cardioversion on 08/02/2019.  He declined SNF placement. Pain control was an issue. He was placed on Eliquis.        The patient {does/does not:200015} have symptoms concerning for COVID-19 infection (fever, chills, cough, or new shortness of breath).   Comes in today. Here with   Past Medical History:  Diagnosis Date  . Anxiety    takes Xanax daily  . Arthritis   . Chronic back pain    DDD  . Complication of anesthesia    agitated when awaking   . H/O rotator cuff tear   . History of kidney stones   . Hypertension    takes Toprol,Prinizide,and Amlodipine daily  . Joint pain   . Joint swelling   . Laceration    history of to right lower extermity   . Night muscle spasms    takes Soma daily  . Numbness and tingling    hands and feet   .  Pneumonia    history of   . Tinnitus     Past Surgical History:  Procedure Laterality Date  . CARDIOVERSION N/A 08/02/2019   Procedure: CARDIOVERSION;  Surgeon: Pixie Casino, MD;  Location: [place];  Service: Cardiovascular;  Laterality: N/A;  . HERNIA REPAIR Bilateral as a baby   inguinal-  . I & D KNEE WITH POLY EXCHANGE Left 07/13/2019   Procedure: IRRIGATION AND DEBRIDEMENT TOTAL  KNEE WITH POLY EXCHANGE;  Surgeon: Paralee Cancel, MD;  Location: [place];  Service: Orthopedics;  Laterality: Left;  . LITHOTRIPSY    . SHOULDER ARTHROSCOPY WITH SUBACROMIAL DECOMPRESSION AND OPEN ROTATOR C Left 01/16/2014   Procedure: LEFT SHOULDER ARTHROSCOPY WITH SUBACROMIAL DECOMPRESSION AND MINI OPEN ROTATOR CUFF REPAIR, OPEN DISTAL CLAVICLE RESECTION AND TENDODESIS;  Surgeon: Augustin Schooling, MD;  Location: [place];  Service: Orthopedics;  Laterality: Left;  . TEE WITHOUT CARDIOVERSION N/A 08/02/2019   Procedure: TRANSESOPHAGEAL ECHOCARDIOGRAM (TEE);  Surgeon: Pixie Casino, MD;  [facility] END[facility]COPY;  Service: Cardiovascular;  Laterality: N/A;  . tens unit     for severe pain   . TOTAL KNEE ARTHROPLASTY Left 06/19/2015   Procedure: LEFT TOTAL KNEE ARTHROPLASTY;  Surgeon: Paralee Cancel, MD;  Location: [facility];  Service: Orthopedics;  Laterality: Left;  . TOTAL KNEE REVISION Left 07/26/2019  Procedure: TOTAL KNEE REVISION removal of left total knee replacement with placement of antibiotic spacers;  Surgeon: Paralee Cancel, MD;  Location: [place];  Service: Orthopedics;  Laterality: Left;     Medications: No outpatient medications have been marked as taking for the 09/14/19 encounter (Appointment) with Burtis Junes, NP.     Allergies: Allergies  Allergen Reactions  . Vancomycin Shortness Of Breath and Rash    [name] . Tolerated at lower rate during hospitalization 12/20.    Social History: The patient  reports that he quit smoking about 15 years ago. His smoking use included  cigarettes. He has a 1.75 pack-year smoking history. He has never used smokeless tobacco. He reports current alcohol use. He reports that he does not use drugs.   Family History: The patient's ***family history includes Heart disease in an other family member.   Review of Systems: Please see the history of present illness.   All other systems are reviewed and negative.   Physical Exam: VS:  There were no vitals taken for this visit. [name] Kitchen  BMI There is no height or weight on file to calculate BMI.  Wt Readings from Last 3 Encounters:  09/06/19 245 lb 13 oz (111.5 kg)  09/16/18 260 lb (117.9 kg)  06/19/15 251 lb 4 oz (114 kg)    General: Pleasant. Well developed, well nourished and in no acute distress.   HEENT: Normal.  Neck: Supple, no JVD, carotid bruits, or masses noted.  Cardiac: ***Regular rate and rhythm. No murmurs, rubs, or gallops. No edema.  Respiratory:  Lungs are clear to auscultation bilaterally with normal work of breathing.  GI: Soft and nontender.  MS: No deformity or atrophy. Gait and ROM intact.  Skin: Warm and dry. Color is normal.  Neuro:  Strength and sensation are intact and no gross focal deficits noted.  Psych: Alert, appropriate and with normal affect.   LABORATORY DATA:  EKG:  EKG {ACTION; IS/IS [id number] ordered today. This demonstrates ***.  Lab Results  Component Value Date   WBC 7.4 09/07/2019   HGB 9.6 (L) 09/07/2019   HCT 31.6 (L) 09/07/2019   PLT 313 09/07/2019   GLUCOSE 116 (H) 09/07/2019   TRIG 415 (H) 07/30/2019   ALT 76 (H) 07/28/2019   AST 54 (H) 07/28/2019   NA 138 09/07/2019   K 4.2 09/07/2019   CL 100 09/07/2019   CREATININE 0.70 09/07/2019   BUN 21 (H) 09/07/2019   CO2 28 09/07/2019   TSH 1.796 07/13/2019   INR 1.3 (H) 08/01/2019   HGBA1C 5.2 07/27/2019     BNP (last 3 results) Recent Labs    07/13/19 0431  BNP 299.7*    ProBNP (last 3 results) No results for input(s): PROBNP in the last 8760 hours.   Other  Studies Reviewed Today:  TEE/Cardioversion Findings:  1. LEFT VENTRICLE: The left ventricular wall thickness is mildly increased.  The left ventricular cavity is normal in size. Wall motion is normal.  LVEF is 60-65%.  2. RIGHT VENTRICLE:  The right ventricle is normal in structure and function without any thrombus or masses.    3. LEFT ATRIUM:  The left atrium is normal in size without any thrombus or masses.  There is not spontaneous echo contrast ("smoke") in the left atrium consistent with a low flow state.  4. LEFT ATRIAL APPENDAGE:  The left atrial appendage is free of any thrombus or masses. The appendage has single lobes. Pulse doppler indicates moderate flow in  the appendage.  5. ATRIAL SEPTUM:  The atrial septum appears intact and is free of thrombus and/or masses.  There is no evidence for interatrial shunting by color doppler and saline microbubble.  6. RIGHT ATRIUM:  The right atrium is mildly dilated in size and function without any thrombus or masses.  7. MITRAL VALVE:  The mitral valve is normal in structure and function with trivial regurgitation.  There were no vegetations or stenosis.  8. AORTIC VALVE:  The aortic valve is trileaflet, normal in structure and function with no regurgitation.  There were no vegetations or stenosis  9. TRICUSPID VALVE:  The tricuspid valve is normal in structure and function with trivial regurgitation.  There were no vegetations or stenosis  10.  PULMONIC VALVE:  The pulmonic valve is normal in structure and function with no regurgitation.  There were no vegetations or stenosis.   11. AORTIC ARCH, ASCENDING AND DESCENDING AORTA:  There was no Ron Parker et. Al, 1992) atherosclerosis of the ascending aorta, aortic arch, or proximal descending aorta.  12. PULMONARY VEINS: Anomalous pulmonary venous return was not noted.  13. PERICARDIUM: The pericardium appeared normal and non-thickened.  There is no pericardial  effusion.  IMPRESSION:   1. No valvular vegetations. 2. No LAA thrombus 3. Negative for PFO 4. LVEF 60-65% 5. Successful DCCV.  RECOMMENDATIONS:    1. Management of bacteremia as per ID recs. 2. Successful DCCV initially to sinus, then accelerated junctional rhythm - stable.  Nadean Corwin Hilty 08/02/2019, 4:01 PM   ECHO IMPRESSIONS 06/2019  1. Left ventricular ejection fraction, by visual estimation, is 55 to  60%. The left ventricle has normal function. There is mildly increased  left ventricular hypertrophy.  2. Left ventricular diastolic function could not be evaluated.  3. Right ventricular volume/pressure overload.  4. The left ventricle demonstrates regional wall motion abnormalities.  5. Global right ventricle has severely reduced systolic function.The  right ventricular size is severely enlarged. No increase in right  ventricular wall thickness.  6. Left atrial size was normal.  7. Right atrial size was mild-moderately dilated.  8. Presence of pericardial fat pad.  9. Trivial pericardial effusion is present.  10. Mild mitral annular calcification.  11. The mitral valve is degenerative. Trivial mitral valve regurgitation.  12. The tricuspid valve is grossly normal. Tricuspid valve regurgitation  is trivial.  13. The aortic valve is tricuspid. Aortic valve regurgitation is not  visualized. No evidence of aortic valve sclerosis or stenosis.  14. The pulmonic valve was grossly normal. Pulmonic valve regurgitation is  not visualized.  15. TR signal is inadequate for assessing pulmonary artery systolic  pressure.  16. The inferior vena cava is normal in size with greater than 50%  respiratory variability, suggesting right atrial pressure of 3 mmHg.  17. No prior Echocardiogram.    Assessment & Plan    1. Post op AF with RVR - required TEE cardioversion back to NSR - on amiodarone and Eliquis  2. Chronic anticoagulation  3. 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 HTN  5. Non compliance  6. COVID-19 Education: The signs and symptoms of COVID-19 were discussed with the patient and how to seek care for testing (follow up with PCP or arrange E-visit).  The importance of social distancing, staying at home, hand hygiene and wearing a mask when out in public were discussed today.  Current medicines are reviewed with the patient today.  The patient does not have concerns regarding medicines other than what has been noted above.  The following changes have been made:  See above.  Labs/ tests ordered today include:   No orders of the defined types were placed in this encounter.    Disposition:   FU with *** in {gen number [id number] {Days to years:10300}.   Patient is agreeable to this plan and will call if any problems develop in the interim.   SignedTruitt Merle, NP  09/13/2019 8:18 AM  [facility] [address] [place], [place]  [address] Phone: [phone] Fax: [phone]

## 2019-09-13 ENCOUNTER — Telehealth: Payer: Self-pay | Admitting: Interventional Cardiology

## 2019-09-13 NOTE — Telephone Encounter (Signed)
New message  Patient is wondering if we offer any kind of assistance with getting transportation to appointments. Please give patient a call back to discuss.Vernon Frye

## 2019-09-13 NOTE — Telephone Encounter (Signed)
Called patient.  [employer] --number has calling restrictions and call cannot go through. Unable to reach patient.  Appointment 2/17 has been cancelled this afternoon. Was consulted by Dr. Tamala Julian during hospitalization in Dec. Will route to his primary nurse to make aware of his transportation assistance questions.

## 2019-09-14 ENCOUNTER — Ambulatory Visit: Payer: Self-pay | Admitting: Nurse Practitioner

## 2019-09-14 NOTE — Telephone Encounter (Signed)
Received the same call restrictions message that the previous nurse received.

## 2019-09-21 NOTE — Telephone Encounter (Signed)
Attempted to contact pt again and received the same message.

## 2019-09-23 ENCOUNTER — Other Ambulatory Visit (HOSPITAL_COMMUNITY)
Admission: RE | Admit: 2019-09-23 | Discharge: 2019-09-23 | Disposition: A | Discharge status: Home / Self Care | Payer: Self-pay | Source: Ambulatory Visit | Attending: Orthopedic Surgery | Admitting: Orthopedic Surgery

## 2019-09-23 ENCOUNTER — Other Ambulatory Visit (HOSPITAL_COMMUNITY): Payer: Self-pay

## 2019-09-23 DIAGNOSIS — Z20822 Contact with and (suspected) exposure to covid-19: Secondary | ICD-10-CM | POA: Insufficient documentation

## 2019-09-23 DIAGNOSIS — Z01812 Encounter for preprocedural laboratory examination: Secondary | ICD-10-CM | POA: Insufficient documentation

## 2019-09-23 LAB — SARS CORONAVIRUS 2 (TAT 6-24 HRS): SARS Coronavirus 2: NEGATIVE

## 2019-09-23 NOTE — Patient Instructions (Addendum)
DUE TO COVID-19 ONLY ONE VISITOR IS ALLOWED TO COME WITH YOU AND STAY IN THE WAITING ROOM ONLY DURING PRE OP AND PROCEDURE DAY OF SURGERY. THE 1 VISITOR MAY VISIT WITH YOU AFTER SURGERY IN YOUR PRIVATE ROOM DURING VISITING HOURS ONLY!  , PLEASE BEGIN THE QUARANTINE INSTRUCTIONS AS OUTLINED IN YOUR HANDOUT.                Vernon Frye    Your procedure is scheduled on: 09/27/19   Report to [facility] Main  Entrance   Report to admitting at  10:00 AM     Call this number if you have problems the morning of surgery [phone]    .   BRUSH YOUR TEETH MORNING OF SURGERY AND RINSE YOUR MOUTH OUT, NO CHEWING GUM CANDY OR MINTS.   Do not eat food After Midnight.   YOU MAY HAVE CLEAR LIQUIDS FROM MIDNIGHT UNTIL 9:00 AM.    CLEAR LIQUID DIET   Foods Allowed                                                                     Foods Excluded  Coffee and tea, regular and decaf                             liquids that you cannot  Plain Jell-O any favor except red or purple                                           see through such as: Fruit ices (not with fruit pulp)                                     milk, soups, orange juice  Iced Popsicles                                    All solid food Carbonated beverages, regular and diet                                    Cranberry, grape and apple juices Sports drinks like Gatorade Lightly seasoned clear broth or consume(fat free) Sugar, honey syrup      At 9:00 AM Please finish the prescribed Pre-Surgery  drink  . Nothing by mouth after you finish the drink !   Take these medicines the morning of surgery with A SIP OF WATER: Oxcodone, Amiodarone, Metoprolol                                 You may not have any metal on your body including              piercings  Do not wear jewelry,  lotions, powders or  deodorant  Men may shave face and neck.   Do not bring valuables to the hospital. [place].  Contacts, dentures or bridgework may not be worn into surgery.                 Please read over the following fact sheets you were given: _____________________________________________________________________             [facility] - Preparing for Surgery  Before surgery, you can play an important role .  Because skin is not sterile, your skin needs to be as free of germs as possible.  You can reduce the number of germs on your skin by washing with CHG (chlorahexidine gluconate) soap before surgery.  CHG is an antiseptic cleaner which kills germs and bonds with the skin to continue killing germs even after washing. Please DO NOT use if you have an allergy to CHG or antibacterial soaps.   If your skin becomes reddened/irritated stop using the CHG and inform your nurse when you arrive at Short Stay. .  You may shave your face/neck . Please follow these instructions carefully:  1.  Shower with CHG Soap the night before surgery and the  morning of Surgery.  2.  If you choose to wash your hair, wash your hair first as usual with your  normal  shampoo.  3.  After you shampoo, rinse your hair and body thoroughly to remove the  shampoo.                                        4.  Use CHG as you would any other liquid soap.  You can apply chg directly  to the skin and wash                       Gently with a scrungie or clean washcloth.  5.  Apply the CHG Soap to your body ONLY FROM THE NECK DOWN.   Do not use on face/ open                           Wound or open sores. Avoid contact with eyes, ears mouth and genitals (private parts).                       Wash face,  Genitals (private parts) with your normal soap.             6.  Wash thoroughly, paying special attention to the area where your surgery  will be performed.  7.  Thoroughly rinse your body with warm water from the neck down.  8.  DO NOT shower/wash with your normal soap after using  and rinsing off  the CHG Soap.             9.  Pat yourself dry with a clean towel.            10.  Wear clean pajamas.            11.  Place clean sheets on your bed the night of your first shower and do not  sleep with pets. Day of Surgery : Do not apply any lotions/deodorants the morning of surgery.  Please wear clean  clothes to the hospital/surgery center.  FAILURE TO FOLLOW THESE INSTRUCTIONS MAY RESULT IN THE CANCELLATION OF YOUR SURGERY PATIENT SIGNATURE_________________________________  NURSE SIGNATURE__________________________________  ________________________________________________________________________

## 2019-09-26 ENCOUNTER — Other Ambulatory Visit: Payer: Self-pay

## 2019-09-26 ENCOUNTER — Encounter (HOSPITAL_COMMUNITY): Payer: Self-pay

## 2019-09-26 ENCOUNTER — Encounter (HOSPITAL_COMMUNITY)
Admission: RE | Admit: 2019-09-26 | Discharge: 2019-09-26 | Disposition: A | Discharge status: Home / Self Care | Payer: Self-pay | Source: Ambulatory Visit | Attending: Orthopedic Surgery | Admitting: Orthopedic Surgery

## 2019-09-26 NOTE — Progress Notes (Signed)
PCP - [place]  Chest x-ray - 07/29/19 EKG - 08/02/19 Stress Test - no ECHO - 08/02/19 Cardiac Cath - no  Sleep Study - yes CPAP - no  Fasting Blood Sugar - 120-130 Checks Blood Sugar _0____ times a day  Blood Thinner Instructions:Eliquis pt states that he was just put on it. Aspirin Instructions:none received from [name]  Pt told to call cardiologist.Anesthesia PA notified Last Dose:09/26/19 AM  Anesthesia review:   Patient denies shortness of breath, fever, cough and chest pain at PAT appointment  yes Patient verbalized understanding of instructions that were given to them at the PAT appointment. Patient was also instructed that they will need to review over the PAT instructions again at home before surgery. Yes.   PAT phone call  Appointment today was 2:00 . No answer. I called him 3 more times he called back at 4:00pm
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
## 2019-09-26 NOTE — Progress Notes (Signed)
Anesthesia Chart Review   Case: [id number] Date/Time: 09/27/19 1150   Procedure: Left knee repeat irrigation and debridement with spacer (Left Knee) - 2 hrs   Anesthesia type: Spinal   Pre-op diagnosis: Status post left total knee resection and placement of antibiotic spacer   Location: [place] [facility] / [facility]   Surgeons: Paralee Cancel, MD      DISCUSSION:58 y.o. former smoker (1.75 pack years, quit 07/28/04) with h/o HTN, A-fib, s/p left total knee resection and placement of antibiotic spacer scheduled for above procedure 09/27/19 with Dr. Paralee Cancel.   Pt admitted 07/12/2019-09/07/2019 with septic left knee.  S/p left knee I &D 07/13/2019, s/p left total knee revision 07/25/20.  After total knee revision he was transferred back to the ICU intubated.  He remained in the ICU until being extubated on 07/31/2019.  He was then transferred out of the unit on 08/03/2019.  A-fib with RVR during admission, successful DCCV 08/02/19.  SNF was recommended, but pt declined due to cost.    PAT nurse unable to reach patient for PAT visit.  Surgeon's office made aware.  He is on Eliquis, unsure if instructions to hold were given.  He is currently scheduled for a spinal.   Discussed with Dr. Kalman Shan.  Pt to be evaluated DOS.  VS: There were no vitals taken for this visit.  PROVIDERS: Nicholes Rough, PA-C is PCP    LABS: labs DOS (all labs ordered are listed, but only abnormal results are displayed)  Labs Reviewed - No data to display   IMAGES:   EKG: 08/02/2019 Rate 88 bpm  Normal sinus rhythm Nonspecific T wave abnormality Prolonged QT Abnormal ECG  CV: Echo 08/02/19 IMPRESSIONS     1. Left ventricular ejection fraction, by visual estimation, is 55 to 60%. The left ventricle has normal function. There is mildly increased left ventricular hypertrophy. 2. Left ventricular diastolic function could not be evaluated. 3. Right ventricular volume/pressure overload. 4. The left ventricle demonstrates regional  wall motion abnormalities. 5. Global right ventricle has severely reduced systolic function.The right ventricular size is severely enlarged. No increase in right ventricular wall thickness. 6. Left atrial size was normal. 7. Right atrial size was mild-moderately dilated. 8. Presence of pericardial fat pad. 9. Trivial pericardial effusion is present. 10. Mild mitral annular calcification. 11. The mitral valve is degenerative. Trivial mitral valve regurgitation. 12. The tricuspid valve is grossly normal. Tricuspid valve regurgitation is trivial. 13. The aortic valve is tricuspid. Aortic valve regurgitation is not visualized. No evidence of aortic valve sclerosis or stenosis. 14. The pulmonic valve was grossly normal. Pulmonic valve regurgitation is not visualized. 15. TR signal is inadequate for assessing pulmonary artery systolic pressure. 16. The inferior vena cava is normal in size with greater than 50% respiratory variability, suggesting right atrial pressure of 3 mmHg. 17. No prior Echocardiogram. Past Medical History:  Diagnosis Date  . Anxiety    takes Xanax daily  . Arthritis   . Chronic back pain    DDD  . Complication of anesthesia    agitated when awaking   . H/O rotator cuff tear   . History of kidney stones   . Hypertension    takes Toprol,Prinizide,and Amlodipine daily  . Joint pain   . Joint swelling   . Laceration    history of to right lower extermity   . Night muscle spasms    takes Soma daily  . Numbness and tingling    hands and feet   .  Pneumonia    history of   . Tinnitus     Past Surgical History:  Procedure Laterality Date  . CARDIOVERSION N/A 08/02/2019   Procedure: CARDIOVERSION;  Surgeon: Pixie Casino, MD;  Location: [place];  Service: Cardiovascular;  Laterality: N/A;  . HERNIA REPAIR Bilateral as a baby   inguinal-  . I & D KNEE WITH POLY EXCHANGE Left 07/13/2019   Procedure: IRRIGATION AND DEBRIDEMENT TOTAL  KNEE WITH POLY EXCHANGE;   Surgeon: Paralee Cancel, MD;  Location: [place];  Service: Orthopedics;  Laterality: Left;  . LITHOTRIPSY    . SHOULDER ARTHROSCOPY WITH SUBACROMIAL DECOMPRESSION AND OPEN ROTATOR C Left 01/16/2014   Procedure: LEFT SHOULDER ARTHROSCOPY WITH SUBACROMIAL DECOMPRESSION AND MINI OPEN ROTATOR CUFF REPAIR, OPEN DISTAL CLAVICLE RESECTION AND TENDODESIS;  Surgeon: Augustin Schooling, MD;  Location: [place];  Service: Orthopedics;  Laterality: Left;  . TEE WITHOUT CARDIOVERSION N/A 08/02/2019   Procedure: TRANSESOPHAGEAL ECHOCARDIOGRAM (TEE);  Surgeon: Pixie Casino, MD;  Location: [facility] ENDOSCOPY;  Service: Cardiovascular;  Laterality: N/A;  . tens unit     for severe pain   . TOTAL KNEE ARTHROPLASTY Left 06/19/2015   Procedure: LEFT TOTAL KNEE ARTHROPLASTY;  Surgeon: Paralee Cancel, MD;  Location: [facility];  Service: Orthopedics;  Laterality: Left;  . TOTAL KNEE REVISION Left 07/26/2019   Procedure: TOTAL KNEE REVISION removal of left total knee replacement with placement of antibiotic spacers;  Surgeon: Paralee Cancel, MD;  Location: [place];  Service: Orthopedics;  Laterality: Left;    MEDICATIONS: . acetaminophen (TYLENOL) 325 MG tablet  . ALPRAZolam (XANAX) 1 MG tablet  . amiodarone (PACERONE) 200 MG tablet  . apixaban (ELIQUIS) 5 MG TABS tablet  . apixaban (ELIQUIS) 5 MG TABS tablet  . doxycycline (VIBRA-TABS) 100 MG tablet  . lisinopril-hydrochlorothiazide (PRINZIDE,ZESTORETIC) 20-12.5 MG per tablet  . metoprolol succinate (TOPROL-XL) 100 MG 24 hr tablet  . oxyCODONE (OXYCONTIN) 20 mg 12 hr tablet  . PENNSAID 2 % SOLN  . polyvinyl alcohol (LIQUIFILM TEARS) 1.4 % ophthalmic solution  . psyllium (HYDROCIL/METAMUCIL) 95 % PACK   No current facility-administered medications for this encounter.    Maia Plan [facility] Pre-Surgical Testing [phone] 09/26/19  3:51 PM

## 2019-09-27 ENCOUNTER — Encounter (HOSPITAL_COMMUNITY): Payer: Self-pay | Admitting: Orthopedic Surgery

## 2019-09-27 ENCOUNTER — Telehealth (HOSPITAL_COMMUNITY): Payer: Self-pay | Admitting: *Deleted

## 2019-09-27 ENCOUNTER — Inpatient Hospital Stay: Payer: Self-pay

## 2019-09-27 ENCOUNTER — Inpatient Hospital Stay (HOSPITAL_COMMUNITY): Payer: No Typology Code available for payment source | Admitting: Certified Registered Nurse Anesthetist

## 2019-09-27 ENCOUNTER — Encounter (HOSPITAL_COMMUNITY): Payer: Self-pay

## 2019-09-27 ENCOUNTER — Inpatient Hospital Stay (HOSPITAL_COMMUNITY)
Admission: RE | Admit: 2019-09-27 | Discharge: 2019-09-30 | DRG: 467 | Disposition: A | Discharge status: Home Health | Payer: No Typology Code available for payment source | Attending: Orthopedic Surgery | Admitting: Orthopedic Surgery

## 2019-09-27 ENCOUNTER — Encounter (HOSPITAL_COMMUNITY)
Admission: RE | Disposition: A | Discharge status: Home Health | Payer: Self-pay | Source: Home / Self Care | Attending: Orthopedic Surgery

## 2019-09-27 DIAGNOSIS — T8450XA Infection and inflammatory reaction due to unspecified internal joint prosthesis, initial encounter: Secondary | ICD-10-CM | POA: Diagnosis not present

## 2019-09-27 DIAGNOSIS — M009 Pyogenic arthritis, unspecified: Secondary | ICD-10-CM | POA: Diagnosis present

## 2019-09-27 DIAGNOSIS — Z8249 Family history of ischemic heart disease and other diseases of the circulatory system: Secondary | ICD-10-CM | POA: Diagnosis not present

## 2019-09-27 DIAGNOSIS — Z87891 Personal history of nicotine dependence: Secondary | ICD-10-CM

## 2019-09-27 DIAGNOSIS — Z95828 Presence of other vascular implants and grafts: Secondary | ICD-10-CM | POA: Diagnosis not present

## 2019-09-27 DIAGNOSIS — Z881 Allergy status to other antibiotic agents status: Secondary | ICD-10-CM

## 2019-09-27 DIAGNOSIS — Z7901 Long term (current) use of anticoagulants: Secondary | ICD-10-CM | POA: Diagnosis not present

## 2019-09-27 DIAGNOSIS — F419 Anxiety disorder, unspecified: Secondary | ICD-10-CM | POA: Diagnosis present

## 2019-09-27 DIAGNOSIS — M21372 Foot drop, left foot: Secondary | ICD-10-CM | POA: Diagnosis present

## 2019-09-27 DIAGNOSIS — T8454XA Infection and inflammatory reaction due to internal left knee prosthesis, initial encounter: Secondary | ICD-10-CM | POA: Diagnosis present

## 2019-09-27 DIAGNOSIS — Z87442 Personal history of urinary calculi: Secondary | ICD-10-CM

## 2019-09-27 DIAGNOSIS — Z8614 Personal history of Methicillin resistant Staphylococcus aureus infection: Secondary | ICD-10-CM | POA: Diagnosis not present

## 2019-09-27 DIAGNOSIS — Z9181 History of falling: Secondary | ICD-10-CM

## 2019-09-27 DIAGNOSIS — Z96652 Presence of left artificial knee joint: Secondary | ICD-10-CM

## 2019-09-27 DIAGNOSIS — Z8619 Personal history of other infectious and parasitic diseases: Secondary | ICD-10-CM | POA: Diagnosis not present

## 2019-09-27 DIAGNOSIS — Z89522 Acquired absence of left knee: Secondary | ICD-10-CM | POA: Diagnosis not present

## 2019-09-27 DIAGNOSIS — M0000 Staphylococcal arthritis, unspecified joint: Secondary | ICD-10-CM | POA: Diagnosis not present

## 2019-09-27 DIAGNOSIS — Y831 Surgical operation with implant of artificial internal device as the cause of abnormal reaction of the patient, or of later complication, without mention of misadventure at the time of the procedure: Secondary | ICD-10-CM | POA: Diagnosis present

## 2019-09-27 DIAGNOSIS — I10 Essential (primary) hypertension: Secondary | ICD-10-CM | POA: Diagnosis present

## 2019-09-27 DIAGNOSIS — B9562 Methicillin resistant Staphylococcus aureus infection as the cause of diseases classified elsewhere: Secondary | ICD-10-CM | POA: Diagnosis not present

## 2019-09-27 DIAGNOSIS — Y792 Prosthetic and other implants, materials and accessory orthopedic devices associated with adverse incidents: Secondary | ICD-10-CM | POA: Diagnosis present

## 2019-09-27 DIAGNOSIS — L89620 Pressure ulcer of left heel, unstageable: Secondary | ICD-10-CM | POA: Diagnosis present

## 2019-09-27 DIAGNOSIS — Z79899 Other long term (current) drug therapy: Secondary | ICD-10-CM | POA: Diagnosis not present

## 2019-09-27 HISTORY — PX: EXCISIONAL TOTAL KNEE ARTHROPLASTY WITH ANTIBIOTIC SPACERS: SHX5827

## 2019-09-27 LAB — COMPREHENSIVE METABOLIC PANEL
ALT: 19 U/L (ref 0–44)
AST: 18 U/L (ref 15–41)
Albumin: 4.1 g/dL (ref 3.5–5.0)
Alkaline Phosphatase: 74 U/L (ref 38–126)
Anion gap: 14 (ref 5–15)
BUN: 14 mg/dL (ref 6–20)
CO2: 26 mmol/L (ref 22–32)
Calcium: 9.9 mg/dL (ref 8.9–10.3)
Chloride: 101 mmol/L (ref 98–111)
Creatinine, Ser: 0.7 mg/dL (ref 0.61–1.24)
GFR calc Af Amer: >60 mL/min (ref >60)
GFR calc non Af Amer: >60 mL/min (ref >60)
Glucose, Bld: 129 mg/dL — ABNORMAL HIGH (ref 70–99)
Potassium: 4.2 mmol/L (ref 3.5–5.1)
Sodium: 141 mmol/L (ref 135–145)
Total Bilirubin: 0.5 mg/dL (ref 0.3–1.2)
Total Protein: 9 g/dL — ABNORMAL HIGH (ref 6.5–8.1)

## 2019-09-27 LAB — CBC
HCT: 40 % (ref 39.0–52.0)
Hemoglobin: 12.5 g/dL — ABNORMAL LOW (ref 13.0–17.0)
MCH: 26.3 pg (ref 26.0–34.0)
MCHC: 31.3 g/dL (ref 30.0–36.0)
MCV: 84.2 fL (ref 80.0–100.0)
Platelets: 373 10*3/uL (ref 150–400)
RBC: 4.75 MIL/uL (ref 4.22–5.81)
RDW: 14 % (ref 11.5–15.5)
WBC: 7.4 10*3/uL (ref 4.0–10.5)
nRBC: 0 % (ref 0.0–0.2)

## 2019-09-27 LAB — C-REACTIVE PROTEIN: CRP: 4.7 mg/dL — ABNORMAL HIGH (ref <1.0)

## 2019-09-27 LAB — PROTIME-INR
INR: 1.1 (ref 0.8–1.2)
Prothrombin Time: 13.8 seconds (ref 11.4–15.2)

## 2019-09-27 LAB — APTT: aPTT: 33 seconds (ref 24–36)

## 2019-09-27 LAB — SEDIMENTATION RATE: Sed Rate: 69 mm/hr — ABNORMAL HIGH (ref 0–16)

## 2019-09-27 SURGERY — REMOVAL, TOTAL ARTHROPLASTY HARDWARE, KNEE, WITH ANTIBIOTIC SPACER INSERTION
Anesthesia: Spinal | Site: Knee | Laterality: Left

## 2019-09-27 MED ORDER — OXYCODONE HCL 5 MG PO TABS: 5.0000 mg | ORAL_TABLET | Freq: Four times a day (QID) | ORAL | Status: DC | PRN

## 2019-09-27 MED ORDER — AMIODARONE HCL 200 MG PO TABS
200.0000 mg | ORAL_TABLET | Freq: Every day | ORAL | Status: DC
  Administered 2019-09-28 – 2019-09-30 (×3): 200 mg via ORAL
  Filled 2019-09-27 (×3): qty 1

## 2019-09-27 MED ORDER — GLYCOPYRROLATE PF 0.2 MG/ML IJ SOSY
PREFILLED_SYRINGE | INTRAMUSCULAR | Status: AC
  Filled 2019-09-27: qty 1

## 2019-09-27 MED ORDER — METOCLOPRAMIDE HCL 5 MG/ML IJ SOLN: 5.0000 mg | Freq: Three times a day (TID) | INTRAMUSCULAR | Status: DC | PRN

## 2019-09-27 MED ORDER — LIDOCAINE 2% (20 MG/ML) 5 ML SYRINGE
INTRAMUSCULAR | Status: AC
  Filled 2019-09-27: qty 5

## 2019-09-27 MED ORDER — BUPIVACAINE HCL (PF) 0.25 % IJ SOLN
INTRAMUSCULAR | Status: AC
  Filled 2019-09-27: qty 30

## 2019-09-27 MED ORDER — DOCUSATE SODIUM 100 MG PO CAPS
100.0000 mg | ORAL_CAPSULE | Freq: Two times a day (BID) | ORAL | Status: DC
  Administered 2019-09-27 – 2019-09-30 (×6): 100 mg via ORAL
  Filled 2019-09-27 (×6): qty 1

## 2019-09-27 MED ORDER — PROPOFOL 10 MG/ML IV BOLUS
INTRAVENOUS | Status: DC | PRN
  Administered 2019-09-27: 30 mg via INTRAVENOUS
  Administered 2019-09-27: 40 mg via INTRAVENOUS

## 2019-09-27 MED ORDER — VANCOMYCIN HCL 1000 MG IV SOLR
1000.0000 mg | Freq: Once | INTRAVENOUS | Status: AC
  Administered 2019-09-27: 1000 mg via INTRAVENOUS

## 2019-09-27 MED ORDER — BISACODYL 10 MG RE SUPP: 10.0000 mg | Freq: Every day | RECTAL | Status: DC | PRN

## 2019-09-27 MED ORDER — ALUM & MAG HYDROXIDE-SIMETH 200-200-20 MG/5ML PO SUSP: 15.0000 mL | ORAL | Status: DC | PRN

## 2019-09-27 MED ORDER — HYDROMORPHONE HCL 1 MG/ML IJ SOLN
INTRAMUSCULAR | Status: AC
  Administered 2019-09-27: 0.5 mg via INTRAVENOUS
  Filled 2019-09-27: qty 1

## 2019-09-27 MED ORDER — VANCOMYCIN HCL 1000 MG IV SOLR
INTRAVENOUS | Status: AC
  Filled 2019-09-27: qty 3000

## 2019-09-27 MED ORDER — DIPHENHYDRAMINE HCL 12.5 MG/5ML PO ELIX: 12.5000 mg | ORAL_SOLUTION | ORAL | Status: DC | PRN

## 2019-09-27 MED ORDER — ACETAMINOPHEN 325 MG PO TABS
325.0000 mg | ORAL_TABLET | Freq: Four times a day (QID) | ORAL | Status: DC | PRN
  Administered 2019-09-28 – 2019-09-29 (×3): 650 mg via ORAL
  Filled 2019-09-27 (×3): qty 2

## 2019-09-27 MED ORDER — SODIUM CHLORIDE 0.9 % IV SOLN: INTRAVENOUS | Status: DC

## 2019-09-27 MED ORDER — STERILE WATER FOR IRRIGATION IR SOLN
Status: DC | PRN
  Administered 2019-09-27: 2000 mL

## 2019-09-27 MED ORDER — MENTHOL 3 MG MT LOZG: 1.0000 | LOZENGE | OROMUCOSAL | Status: DC | PRN

## 2019-09-27 MED ORDER — CEFAZOLIN SODIUM-DEXTROSE 2-4 GM/100ML-% IV SOLN
2.0000 g | INTRAVENOUS | Status: AC
  Administered 2019-09-27: 2 g via INTRAVENOUS

## 2019-09-27 MED ORDER — FERROUS SULFATE 325 (65 FE) MG PO TABS
325.0000 mg | ORAL_TABLET | Freq: Two times a day (BID) | ORAL | Status: DC
  Administered 2019-09-28 – 2019-09-30 (×5): 325 mg via ORAL
  Filled 2019-09-27 (×6): qty 1

## 2019-09-27 MED ORDER — ROPIVACAINE HCL 7.5 MG/ML IJ SOLN
INTRAMUSCULAR | Status: DC | PRN
  Administered 2019-09-27: 20 mL via PERINEURAL

## 2019-09-27 MED ORDER — FENTANYL CITRATE (PF) 100 MCG/2ML IJ SOLN: 50.0000 ug | INTRAMUSCULAR | Status: DC

## 2019-09-27 MED ORDER — HYDROMORPHONE HCL 1 MG/ML IJ SOLN
0.5000 mg | INTRAMUSCULAR | Status: DC | PRN
  Administered 2019-09-27 – 2019-09-28 (×4): 2 mg via INTRAVENOUS
  Administered 2019-09-28: 1 mg via INTRAVENOUS
  Filled 2019-09-27 (×2): qty 2
  Filled 2019-09-27: qty 1
  Filled 2019-09-27 (×2): qty 2
  Filled 2019-09-27: qty 1

## 2019-09-27 MED ORDER — OXYCODONE HCL 5 MG PO TABS: 5.0000 mg | ORAL_TABLET | ORAL | Status: DC | PRN

## 2019-09-27 MED ORDER — BUPIVACAINE IN DEXTROSE 0.75-8.25 % IT SOLN
INTRATHECAL | Status: DC | PRN
  Administered 2019-09-27: 2 mL via INTRATHECAL

## 2019-09-27 MED ORDER — FENTANYL CITRATE (PF) 100 MCG/2ML IJ SOLN
INTRAMUSCULAR | Status: AC
  Filled 2019-09-27: qty 2

## 2019-09-27 MED ORDER — ONDANSETRON HCL 4 MG/2ML IJ SOLN: 4.0000 mg | Freq: Four times a day (QID) | INTRAMUSCULAR | Status: DC | PRN

## 2019-09-27 MED ORDER — METOCLOPRAMIDE HCL 5 MG PO TABS: 5.0000 mg | ORAL_TABLET | Freq: Three times a day (TID) | ORAL | Status: DC | PRN

## 2019-09-27 MED ORDER — CELECOXIB 200 MG PO CAPS
200.0000 mg | ORAL_CAPSULE | Freq: Two times a day (BID) | ORAL | Status: DC
  Administered 2019-09-27 – 2019-09-30 (×6): 200 mg via ORAL
  Filled 2019-09-27 (×6): qty 1

## 2019-09-27 MED ORDER — POVIDONE-IODINE 10 % EX SWAB: 2.0000 "application " | Freq: Once | CUTANEOUS | Status: DC

## 2019-09-27 MED ORDER — DEXAMETHASONE SODIUM PHOSPHATE 10 MG/ML IJ SOLN
INTRAMUSCULAR | Status: AC
  Filled 2019-09-27: qty 1

## 2019-09-27 MED ORDER — POVIDONE-IODINE 10 % EX SWAB
2.0000 "application " | Freq: Once | CUTANEOUS | Status: AC
  Administered 2019-09-27: 2 via TOPICAL

## 2019-09-27 MED ORDER — HYDROMORPHONE HCL 1 MG/ML IJ SOLN: 0.5000 mg | INTRAMUSCULAR | Status: DC | PRN

## 2019-09-27 MED ORDER — GLYCOPYRROLATE PF 0.2 MG/ML IJ SOSY
PREFILLED_SYRINGE | INTRAMUSCULAR | Status: DC | PRN
  Administered 2019-09-27: .2 mg via INTRAVENOUS

## 2019-09-27 MED ORDER — MIDAZOLAM HCL 5 MG/5ML IJ SOLN
INTRAMUSCULAR | Status: DC | PRN
  Administered 2019-09-27: 2 mg via INTRAVENOUS

## 2019-09-27 MED ORDER — VANCOMYCIN HCL IN DEXTROSE 1-5 GM/200ML-% IV SOLN
INTRAVENOUS | Status: AC
  Filled 2019-09-27: qty 200

## 2019-09-27 MED ORDER — CEFAZOLIN SODIUM-DEXTROSE 2-4 GM/100ML-% IV SOLN
INTRAVENOUS | Status: AC
  Filled 2019-09-27: qty 100

## 2019-09-27 MED ORDER — KETOROLAC TROMETHAMINE 30 MG/ML IJ SOLN
INTRAMUSCULAR | Status: AC
  Filled 2019-09-27: qty 1

## 2019-09-27 MED ORDER — MIDAZOLAM HCL 2 MG/2ML IJ SOLN: 1.0000 mg | INTRAMUSCULAR | Status: DC

## 2019-09-27 MED ORDER — PHENYLEPHRINE HCL-NACL 10-0.9 MG/250ML-% IV SOLN
INTRAVENOUS | Status: DC | PRN
  Administered 2019-09-27: 15 ug/min via INTRAVENOUS

## 2019-09-27 MED ORDER — PROPOFOL 500 MG/50ML IV EMUL
INTRAVENOUS | Status: DC | PRN
  Administered 2019-09-27: 50 ug/kg/min via INTRAVENOUS

## 2019-09-27 MED ORDER — EPHEDRINE SULFATE-NACL 50-0.9 MG/10ML-% IV SOSY
PREFILLED_SYRINGE | INTRAVENOUS | Status: DC | PRN
  Administered 2019-09-27 (×2): 10 mg via INTRAVENOUS

## 2019-09-27 MED ORDER — METOPROLOL SUCCINATE ER 50 MG PO TB24
100.0000 mg | ORAL_TABLET | Freq: Every day | ORAL | Status: DC
  Administered 2019-09-28 – 2019-09-30 (×3): 100 mg via ORAL
  Filled 2019-09-27 (×3): qty 2

## 2019-09-27 MED ORDER — OXYCODONE HCL 5 MG PO TABS
10.0000 mg | ORAL_TABLET | Freq: Four times a day (QID) | ORAL | Status: DC | PRN
  Administered 2019-09-27: 15 mg via ORAL
  Filled 2019-09-27 (×2): qty 3

## 2019-09-27 MED ORDER — ONDANSETRON HCL 4 MG PO TABS: 4.0000 mg | ORAL_TABLET | Freq: Four times a day (QID) | ORAL | Status: DC | PRN

## 2019-09-27 MED ORDER — FUROSEMIDE 20 MG PO TABS
20.0000 mg | ORAL_TABLET | Freq: Every day | ORAL | Status: DC
  Administered 2019-09-28 – 2019-09-30 (×3): 20 mg via ORAL
  Filled 2019-09-27 (×3): qty 1

## 2019-09-27 MED ORDER — 0.9 % SODIUM CHLORIDE (POUR BTL) OPTIME
TOPICAL | Status: DC | PRN
  Administered 2019-09-27: 1000 mL

## 2019-09-27 MED ORDER — SODIUM CHLORIDE 0.9 % IR SOLN
Status: DC | PRN
  Administered 2019-09-27 (×2): 3000 mL

## 2019-09-27 MED ORDER — PHENOL 1.4 % MT LIQD
1.0000 | OROMUCOSAL | Status: DC | PRN
  Filled 2019-09-27: qty 177

## 2019-09-27 MED ORDER — TRANEXAMIC ACID-NACL 1000-0.7 MG/100ML-% IV SOLN
1000.0000 mg | INTRAVENOUS | Status: AC
  Administered 2019-09-27: 1000 mg via INTRAVENOUS
  Filled 2019-09-27: qty 100

## 2019-09-27 MED ORDER — PROPOFOL 1000 MG/100ML IV EMUL
INTRAVENOUS | Status: AC
  Filled 2019-09-27: qty 100

## 2019-09-27 MED ORDER — LACTATED RINGERS IV SOLN: INTRAVENOUS | Status: DC

## 2019-09-27 MED ORDER — CHLORHEXIDINE GLUCONATE 4 % EX LIQD: 60.0000 mL | Freq: Once | CUTANEOUS | Status: DC

## 2019-09-27 MED ORDER — METHOCARBAMOL 500 MG PO TABS
500.0000 mg | ORAL_TABLET | Freq: Four times a day (QID) | ORAL | Status: DC | PRN
  Administered 2019-09-28 – 2019-09-30 (×8): 500 mg via ORAL
  Filled 2019-09-27 (×9): qty 1

## 2019-09-27 MED ORDER — POLYETHYLENE GLYCOL 3350 17 G PO PACK
17.0000 g | PACK | Freq: Two times a day (BID) | ORAL | Status: DC
  Administered 2019-09-29 (×2): 17 g via ORAL
  Filled 2019-09-27 (×5): qty 1

## 2019-09-27 MED ORDER — METHOCARBAMOL 500 MG IVPB - SIMPLE MED
INTRAVENOUS | Status: AC
  Filled 2019-09-27: qty 50

## 2019-09-27 MED ORDER — DOXYCYCLINE HYCLATE 100 MG PO TABS
100.0000 mg | ORAL_TABLET | Freq: Two times a day (BID) | ORAL | Status: DC
  Administered 2019-09-27 – 2019-09-28 (×2): 100 mg via ORAL
  Filled 2019-09-27 (×2): qty 1

## 2019-09-27 MED ORDER — ONDANSETRON HCL 4 MG/2ML IJ SOLN: 4.0000 mg | Freq: Once | INTRAMUSCULAR | Status: DC | PRN

## 2019-09-27 MED ORDER — TOBRAMYCIN SULFATE 1.2 G IJ SOLR
INTRAMUSCULAR | Status: AC
  Filled 2019-09-27: qty 2.4

## 2019-09-27 MED ORDER — HYDROMORPHONE HCL 1 MG/ML IJ SOLN
INTRAMUSCULAR | Status: AC
  Administered 2019-09-27: 0.5 mg via INTRAVENOUS
  Filled 2019-09-27: qty 2

## 2019-09-27 MED ORDER — VANCOMYCIN HCL 1000 MG IV SOLR
INTRAVENOUS | Status: DC | PRN
  Administered 2019-09-27: 3 g

## 2019-09-27 MED ORDER — DEXTROSE 5 % IV SOLN: 3.0000 g | INTRAVENOUS | Status: DC

## 2019-09-27 MED ORDER — METHOCARBAMOL 500 MG IVPB - SIMPLE MED
500.0000 mg | Freq: Four times a day (QID) | INTRAVENOUS | Status: DC | PRN
  Administered 2019-09-27: 500 mg via INTRAVENOUS
  Filled 2019-09-27: qty 50

## 2019-09-27 MED ORDER — TOBRAMYCIN SULFATE 1.2 G IJ SOLR
INTRAMUSCULAR | Status: DC | PRN
  Administered 2019-09-27: 2.4 g

## 2019-09-27 MED ORDER — HYDROMORPHONE HCL 1 MG/ML IJ SOLN
0.5000 mg | INTRAMUSCULAR | Status: DC | PRN
  Administered 2019-09-27: 1 mg via INTRAVENOUS
  Filled 2019-09-27: qty 1

## 2019-09-27 MED ORDER — MIDAZOLAM HCL 2 MG/2ML IJ SOLN
INTRAMUSCULAR | Status: AC
  Administered 2019-09-27: 2 mg via INTRAVENOUS
  Filled 2019-09-27: qty 2

## 2019-09-27 MED ORDER — ONDANSETRON HCL 4 MG/2ML IJ SOLN
INTRAMUSCULAR | Status: AC
  Filled 2019-09-27: qty 2

## 2019-09-27 MED ORDER — PHENYLEPHRINE 40 MCG/ML (10ML) SYRINGE FOR IV PUSH (FOR BLOOD PRESSURE SUPPORT)
PREFILLED_SYRINGE | INTRAVENOUS | Status: DC | PRN
  Administered 2019-09-27: 80 ug via INTRAVENOUS

## 2019-09-27 MED ORDER — ONDANSETRON HCL 4 MG/2ML IJ SOLN
INTRAMUSCULAR | Status: DC | PRN
  Administered 2019-09-27: 4 mg via INTRAVENOUS

## 2019-09-27 MED ORDER — MEPERIDINE HCL 50 MG/ML IJ SOLN: 6.2500 mg | INTRAMUSCULAR | Status: DC | PRN

## 2019-09-27 MED ORDER — MIDAZOLAM HCL 2 MG/2ML IJ SOLN
INTRAMUSCULAR | Status: AC
  Filled 2019-09-27: qty 2

## 2019-09-27 MED ORDER — LISINOPRIL 20 MG PO TABS
40.0000 mg | ORAL_TABLET | Freq: Every day | ORAL | Status: DC
  Administered 2019-09-27 – 2019-09-30 (×4): 40 mg via ORAL
  Filled 2019-09-27 (×4): qty 2

## 2019-09-27 MED ORDER — IRRISEPT - 450ML BOTTLE WITH 0.05% CHG IN STERILE WATER, USP 99.95% OPTIME
TOPICAL | Status: DC | PRN
  Administered 2019-09-27: 450 mL

## 2019-09-27 MED ORDER — FENTANYL CITRATE (PF) 100 MCG/2ML IJ SOLN
INTRAMUSCULAR | Status: AC
  Administered 2019-09-27: 100 ug via INTRAVENOUS
  Filled 2019-09-27: qty 2

## 2019-09-27 MED ORDER — DEXAMETHASONE SODIUM PHOSPHATE 10 MG/ML IJ SOLN
10.0000 mg | Freq: Once | INTRAMUSCULAR | Status: AC
  Administered 2019-09-28: 10 mg via INTRAVENOUS
  Filled 2019-09-27: qty 1

## 2019-09-27 MED ORDER — LIDOCAINE 2% (20 MG/ML) 5 ML SYRINGE
INTRAMUSCULAR | Status: DC | PRN
  Administered 2019-09-27: 100 mg via INTRAVENOUS

## 2019-09-27 MED ORDER — PROPOFOL 10 MG/ML IV BOLUS
INTRAVENOUS | Status: AC
  Filled 2019-09-27: qty 20

## 2019-09-27 MED ORDER — TRANEXAMIC ACID-NACL 1000-0.7 MG/100ML-% IV SOLN
1000.0000 mg | Freq: Once | INTRAVENOUS | Status: AC
  Administered 2019-09-27: 1000 mg via INTRAVENOUS
  Filled 2019-09-27: qty 100

## 2019-09-27 MED ORDER — APIXABAN 2.5 MG PO TABS
2.5000 mg | ORAL_TABLET | Freq: Two times a day (BID) | ORAL | Status: DC
  Administered 2019-09-28 – 2019-09-30 (×5): 2.5 mg via ORAL
  Filled 2019-09-27 (×5): qty 1

## 2019-09-27 MED ORDER — LISINOPRIL-HYDROCHLOROTHIAZIDE 20-12.5 MG PO TABS: 2.0000 | ORAL_TABLET | Freq: Every day | ORAL | Status: DC

## 2019-09-27 MED ORDER — CEFAZOLIN SODIUM-DEXTROSE 2-4 GM/100ML-% IV SOLN
2.0000 g | Freq: Four times a day (QID) | INTRAVENOUS | Status: AC
  Administered 2019-09-27 – 2019-09-28 (×2): 2 g via INTRAVENOUS
  Filled 2019-09-27 (×2): qty 100

## 2019-09-27 MED ORDER — TOBRAMYCIN SULFATE 1.2 G IJ SOLR
INTRAMUSCULAR | Status: AC
  Filled 2019-09-27: qty 3.6

## 2019-09-27 MED ORDER — HYDROCHLOROTHIAZIDE 25 MG PO TABS
25.0000 mg | ORAL_TABLET | Freq: Every day | ORAL | Status: DC
  Administered 2019-09-27 – 2019-09-30 (×4): 25 mg via ORAL
  Filled 2019-09-27 (×4): qty 1

## 2019-09-27 MED ORDER — OXYCODONE HCL 5 MG PO TABS
10.0000 mg | ORAL_TABLET | ORAL | Status: DC | PRN
  Administered 2019-09-27: 15 mg via ORAL

## 2019-09-27 MED ORDER — MAGNESIUM CITRATE PO SOLN: 1.0000 | Freq: Once | ORAL | Status: DC | PRN

## 2019-09-27 MED ORDER — ENSURE PRE-SURGERY PO LIQD
296.0000 mL | Freq: Once | ORAL | Status: DC
  Filled 2019-09-27: qty 296

## 2019-09-27 MED ORDER — ALPRAZOLAM 1 MG PO TABS
1.0000 mg | ORAL_TABLET | Freq: Every day | ORAL | Status: DC | PRN
  Administered 2019-09-28 – 2019-09-30 (×3): 1 mg via ORAL
  Filled 2019-09-27 (×4): qty 1

## 2019-09-27 MED ORDER — HYDROMORPHONE HCL 1 MG/ML IJ SOLN
0.2500 mg | INTRAMUSCULAR | Status: DC | PRN
  Administered 2019-09-27 (×3): 0.5 mg via INTRAVENOUS

## 2019-09-27 MED ORDER — SODIUM CHLORIDE (PF) 0.9 % IJ SOLN
INTRAMUSCULAR | Status: AC
  Filled 2019-09-27: qty 50

## 2019-09-27 SURGICAL SUPPLY — 57 items
ADH SKN CLS APL DERMABOND .7 (GAUZE/BANDAGES/DRESSINGS) ×1
AUGMENT TIBL 52 AP 81 KNEE LRG (Miscellaneous) IMPLANT
BAG SPEC THK2 15X12 ZIP CLS (MISCELLANEOUS) ×1
BAG ZIPLOCK 12X15 (MISCELLANEOUS) ×2 IMPLANT
BLADE SAW SGTL 13.0X1.19X90.0M (BLADE) ×2 IMPLANT
BLADE SAW SGTL 81X20 HD (BLADE) ×2 IMPLANT
BNDG ELASTIC 6X5.8 VLCR STR LF (GAUZE/BANDAGES/DRESSINGS) ×2 IMPLANT
BOWL SMART MIX CTS (DISPOSABLE) ×3 IMPLANT
BRUSH FEMORAL CANAL (MISCELLANEOUS) ×2 IMPLANT
CEMENT HV SMART SET (Cement) ×7 IMPLANT
CNTNR URN SCR LID CUP LEK RST (MISCELLANEOUS) IMPLANT
CONT SPEC 4OZ STRL OR WHT (MISCELLANEOUS) ×2
COVER SURGICAL LIGHT HANDLE (MISCELLANEOUS) ×2 IMPLANT
CUFF TOURN SGL QUICK 34 (TOURNIQUET CUFF) ×2
CUFF TRNQT CYL 34X4.125X (TOURNIQUET CUFF) ×1 IMPLANT
DERMABOND ADVANCED (GAUZE/BANDAGES/DRESSINGS) ×1
DERMABOND ADVANCED .7 DNX12 (GAUZE/BANDAGES/DRESSINGS) ×1 IMPLANT
DRAPE U-SHAPE 47X51 STRL (DRAPES) ×2 IMPLANT
DRESSING AQUACEL AG SP 3.5X10 (GAUZE/BANDAGES/DRESSINGS) ×1 IMPLANT
DRSG AQUACEL AG SP 3.5X10 (GAUZE/BANDAGES/DRESSINGS) ×2
DURAPREP 26ML APPLICATOR (WOUND CARE) ×4 IMPLANT
ELECT REM PT RETURN 15FT ADLT (MISCELLANEOUS) ×2 IMPLANT
FEMORAL 53 AP 75 KNEE LRG (Miscellaneous) ×1 IMPLANT
GLOVE BIO SURGEON STRL SZ 6 (GLOVE) ×4 IMPLANT
GLOVE BIOGEL PI IND STRL 6.5 (GLOVE) ×1 IMPLANT
GLOVE BIOGEL PI IND STRL 8.5 (GLOVE) ×1 IMPLANT
GLOVE BIOGEL PI INDICATOR 6.5 (GLOVE) ×1
GLOVE BIOGEL PI INDICATOR 8.5 (GLOVE) ×1
GLOVE ECLIPSE 8.0 STRL XLNG CF (GLOVE) ×4 IMPLANT
GLOVE INDICATOR 7.5 STRL GRN (GLOVE) ×2 IMPLANT
GLOVE ORTHO TXT STRL SZ7.5 (GLOVE) ×4 IMPLANT
GOWN STRL REUS W/TWL LRG LVL3 (GOWN DISPOSABLE) ×4 IMPLANT
GOWN STRL REUS W/TWL XL LVL3 (GOWN DISPOSABLE) ×2 IMPLANT
HANDPIECE INTERPULSE COAX TIP (DISPOSABLE) ×2
IMMOBILIZER KNEE 20 (SOFTGOODS) ×2
IMMOBILIZER KNEE 20 THIGH 36 (SOFTGOODS) IMPLANT
JET LAVAGE IRRISEPT WOUND (IRRIGATION / IRRIGATOR) ×2
KIT TURNOVER KIT A (KITS) IMPLANT
LAVAGE JET IRRISEPT WOUND (IRRIGATION / IRRIGATOR) ×1 IMPLANT
MANIFOLD NEPTUNE II (INSTRUMENTS) ×2 IMPLANT
NS IRRIG 1000ML POUR BTL (IV SOLUTION) ×2 IMPLANT
PACK TOTAL KNEE CUSTOM (KITS) ×2 IMPLANT
PENCIL SMOKE EVACUATOR (MISCELLANEOUS) ×1 IMPLANT
PROTECTOR NERVE ULNAR (MISCELLANEOUS) ×2 IMPLANT
SET HNDPC FAN SPRY TIP SCT (DISPOSABLE) ×1 IMPLANT
SET PAD KNEE POSITIONER (MISCELLANEOUS) ×2 IMPLANT
STAPLER VISISTAT 35W (STAPLE) IMPLANT
SUT MNCRL AB 3-0 PS2 18 (SUTURE) ×2 IMPLANT
SUT STRATAFIX PDS+ 0 24IN (SUTURE) ×3 IMPLANT
SUT VIC AB 1 CT1 36 (SUTURE) ×4 IMPLANT
SUT VIC AB 2-0 CT1 27 (SUTURE) ×8
SUT VIC AB 2-0 CT1 TAPERPNT 27 (SUTURE) ×3 IMPLANT
TIBIAL 52 AP 81 KNEE LRG (Miscellaneous) ×2 IMPLANT
TRAY FOLEY MTR SLVR 16FR STAT (SET/KITS/TRAYS/PACK) ×2 IMPLANT
WATER STERILE IRR 1000ML POUR (IV SOLUTION) ×2 IMPLANT
WRAP KNEE MAXI GEL POST OP (GAUZE/BANDAGES/DRESSINGS) ×2 IMPLANT
YANKAUER SUCT BULB TIP 10FT TU (MISCELLANEOUS) ×2 IMPLANT

## 2019-09-27 NOTE — Interval H&P Note (Signed)
History and Physical Interval Note:  09/27/2019 12:20 PM  Vernon Frye  has presented today for surgery, with the diagnosis of Status post left total knee resection and placement of antibiotic spacer.  The various methods of treatment have been discussed with the patient and family. After consideration of risks, benefits and other options for treatment, the patient has consented to  Procedure(s) with comments: Left knee repeat irrigation and debridement with spacer (Left) - 2 hrs as a surgical intervention.  The patient's history has been reviewed, patient examined, no change in status, stable for surgery.  I have reviewed the patient's chart and labs.  Questions were answered to the patient's satisfaction.     [name]

## 2019-09-27 NOTE — Transfer of Care (Signed)
Immediate Anesthesia Transfer of Care Note  Patient: Vernon Frye  Procedure(s) Performed: Left knee repeat irrigation and debridement with spacer (Left Knee)  Patient Location: PACU  Anesthesia Type:Spinal  Level of Consciousness: sedated, patient cooperative and responds to stimulation  Airway & Oxygen Therapy: Patient Spontanous Breathing and Patient connected to face mask oxygen  Post-op Assessment: Report given to RN and Post -op Vital signs reviewed and stable  Post vital signs: Reviewed and stable  Last Vitals:  Vitals Value Taken Time  BP 142/87 09/27/19 1651  Temp    Pulse 65 09/27/19 1656  Resp 19 09/27/19 1656  SpO2 100 % 09/27/19 1656  Vitals shown include unvalidated device data.  Last Pain:  Vitals:   09/27/19 1131  TempSrc: Oral         Complications: No apparent anesthesia complications

## 2019-09-27 NOTE — Brief Op Note (Signed)
09/27/2019  1:29 PM  PATIENT:  Vernon Frye  59 y.o. male  PRE-OPERATIVE DIAGNOSIS:  Status post left total knee resection and placement of antibiotic spacer with persistent versus recurrent infection and non-healing distal wound  POST-OPERATIVE DIAGNOSIS:  Status post left total knee resection and placement of antibiotic spacer with persistent versus recurrent infection and non-healing distal wound  PROCEDURE:  Procedure(s) with comments: Left knee repeat irrigation and debridement with spacer (Left) - 2 hrs  SURGEON:  Surgeon(s) and Role:    Paralee Cancel, MD - Primary  PHYSICIAN ASSISTANT: Griffith Citron, PA-C  ANESTHESIA:   regional and spinal  EBL:  <400 cc  BLOOD ADMINISTERED:none  DRAINS: none   LOCAL MEDICATIONS USED:  NONE  SPECIMEN:  Source of Specimen:  left knee synovial fluid  DISPOSITION OF SPECIMEN:  PATHOLOGY  COUNTS:  YES  TOURNIQUET:  60 min at 250 mmHg  DICTATION: .Other Dictation: Dictation Number number in my book  PLAN OF CARE: Admit to inpatient   PATIENT DISPOSITION:  PACU - hemodynamically stable.   Delay start of Pharmacological VTE agent (>24hrs) due to surgical blood loss or risk of bleeding: no

## 2019-09-27 NOTE — Anesthesia Postprocedure Evaluation (Signed)
Anesthesia Post Note  Patient: Vernon Frye  Procedure(s) Performed: Left knee repeat irrigation and debridement with spacer (Left Knee)     Patient location during evaluation: PACU Anesthesia Type: Spinal Level of consciousness: awake and alert Pain management: pain level controlled Vital Signs Assessment: post-procedure vital signs reviewed and stable Respiratory status: spontaneous breathing, nonlabored ventilation, respiratory function stable and patient connected to nasal cannula oxygen Cardiovascular status: blood pressure returned to baseline and stable Postop Assessment: no apparent nausea or vomiting Anesthetic complications: no    Last Vitals:  Vitals:   09/27/19 1247 09/27/19 1248  BP:    Pulse: 78 78  Resp: 12 (!) 21  Temp:    SpO2: 100% 100%    Last Pain:  Vitals:   09/27/19 1131  TempSrc: Oral                 Chisa Kushner DAVID

## 2019-09-27 NOTE — Anesthesia Preprocedure Evaluation (Signed)
Anesthesia Evaluation  Patient identified by MRN, date of birth, ID band Patient awake    Reviewed: Allergy & Precautions, NPO status , Patient's Chart, lab work & pertinent test results  Airway Mallampati: I  TM Distance: >3 FB Neck ROM: Full    Dental   Pulmonary former smoker,    Pulmonary exam normal        Cardiovascular hypertension, Pt. on medications Normal cardiovascular exam     Neuro/Psych Anxiety    GI/Hepatic   Endo/Other    Renal/GU      Musculoskeletal   Abdominal   Peds  Hematology   Anesthesia Other Findings   Reproductive/Obstetrics                             Anesthesia Physical Anesthesia Plan  ASA: II  Anesthesia Plan: Spinal   Post-op Pain Management:    Induction: Intravenous  PONV Risk Score and Plan: 1 and Ondansetron  Airway Management Planned: Nasal Cannula  Additional Equipment:   Intra-op Plan:   Post-operative Plan:   Informed Consent: I have reviewed the patients History and Physical, chart, labs and discussed the procedure including the risks, benefits and alternatives for the proposed anesthesia with the patient or authorized representative who has indicated his/her understanding and acceptance.       Plan Discussed with: CRNA and Surgeon  Anesthesia Plan Comments:         Anesthesia Quick Evaluation

## 2019-09-27 NOTE — Progress Notes (Signed)
Pt complaining his pain medications are not helping enough or lasting long enough. MD on call notified. Orders given. See Chart

## 2019-09-27 NOTE — Progress Notes (Signed)
AssistedDr. Ossey with left, ultrasound guided, adductor canal block. Side rails up, monitors on throughout procedure. See vital signs in flow sheet. Tolerated Procedure well.  

## 2019-09-27 NOTE — Discharge Instructions (Signed)
INSTRUCTIONS AFTER SURGERY  o Remove items at home which could result in a fall. This includes throw rugs or furniture in walking pathways o ICE to the affected joint every three hours while awake for 30 minutes at a time, for at least the first 3-5 days, and then as needed for pain and swelling.  Continue to use ice for pain and swelling. You may notice swelling that will progress down to the foot and ankle.  This is normal after surgery.  Elevate your leg when you are not up walking on it.   o Continue to use the breathing machine you got in the hospital (incentive spirometer) which will help keep your temperature down.  It is common for your temperature to cycle up and down following surgery, especially at night when you are not up moving around and exerting yourself.  The breathing machine keeps your lungs expanded and your temperature down.   DIET:  As you were doing prior to hospitalization, we recommend a well-balanced diet.  DRESSING / WOUND CARE / SHOWERING  Keep the surgical dressing until follow up.  The dressing is water proof, so you can shower without any extra covering.  IF THE DRESSING FALLS OFF or the wound gets wet inside, change the dressing with sterile gauze.  Please use good hand washing techniques before changing the dressing.  Do not use any lotions or creams on the incision until instructed by your surgeon.    ACTIVITY  o Increase activity slowly as tolerated, but follow the weight bearing instructions below.   o No driving for 6 weeks or until further direction given by your physician.  You cannot drive while taking narcotics.  o No lifting or carrying greater than 10 lbs. until further directed by your surgeon. o Avoid periods of inactivity such as sitting longer than an hour when not asleep. This helps prevent blood clots.  o You may return to work once you are authorized by your doctor.     WEIGHT BEARING   Partial weight bearing with assist device as directed.   .   CONSTIPATION  Constipation is defined medically as fewer than three stools per week and severe constipation as less than one stool per week.  Even if you have a regular bowel pattern at home, your normal regimen is likely to be disrupted due to multiple reasons following surgery.  Combination of anesthesia, postoperative narcotics, change in appetite and fluid intake all can affect your bowels.   YOU MUST use at least one of the following options; they are listed in order of increasing strength to get the job done.  They are all available over the counter, and you may need to use some, POSSIBLY even all of these options:    Drink plenty of fluids (prune juice may be helpful) and high fiber foods Colace 100 mg by mouth twice a day  Senokot for constipation as directed and as needed Dulcolax (bisacodyl), take with full glass of water  Miralax (polyethylene glycol) once or twice a day as needed.  If you have tried all these things and are unable to have a bowel movement in the first 3-4 days after surgery call either your surgeon or your primary doctor.    If you experience loose stools or diarrhea, hold the medications until you stool forms back up.  If your symptoms do not get better within 1 week or if they get worse, check with your doctor.  If you experience "the worst abdominal  pain ever" or develop nausea or vomiting, please contact the office immediately for further recommendations for treatment.   ITCHING:  If you experience itching with your medications, try taking only a single pain pill, or even half a pain pill at a time.  You can also use Benadryl over the counter for itching or also to help with sleep.   TED HOSE STOCKINGS:  Use stockings on both legs until for at least 2 weeks or as directed by physician office. They may be removed at night for sleeping.  MEDICATIONS:  See your medication summary on the After Visit Summary that nursing will review with you.  You may have  some home medications which will be placed on hold until you complete the course of blood thinner medication.  It is important for you to complete the blood thinner medication as prescribed.  PRECAUTIONS:  If you experience chest pain or shortness of breath - call 911 immediately for transfer to the hospital emergency department.   If you develop a fever greater that 101 F, purulent drainage from wound, increased redness or drainage from wound, foul odor from the wound/dressing, or calf pain - CONTACT YOUR SURGEON.                                                   FOLLOW-UP APPOINTMENTS:  If you do not already have a post-op appointment, please call the office for an appointment to be seen by your surgeon.  Guidelines for how soon to be seen are listed in your After Visit Summary, but are typically between 1-4 weeks after surgery.  OTHER INSTRUCTIONS:   Knee Replacement:  Do not place pillow under knee, focus on keeping the knee straight while resting. CPM instructions: 0-90 degrees, 2 hours in the morning, 2 hours in the afternoon, and 2 hours in the evening. Place foam block, curve side up under heel at all times except when in CPM or when walking.  DO NOT modify, tear, cut, or change the foam block in any way.  MAKE SURE YOU:   Understand these instructions.   Get help right away if you are not doing well or get worse.    Thank you for letting us be a part of your medical care team.  It is a privilege we respect greatly.  We hope these instructions will help you stay on track for a fast and full recovery!   Information on my medicine - ELIQUIS (apixaban)  This medication education was reviewed with me or my healthcare representative as part of my discharge preparation.  The pharmacist that spoke with me during my hospital stay was: Why was Eliquis prescribed for you? Eliquis was prescribed for you to reduce the risk of blood clots forming after orthopedic surgery.    What do You  need to know about Eliquis? Take your Eliquis TWICE DAILY - one tablet in the morning and one tablet in the evening with or without food.  It would be best to take the dose about the same time each day.  If you have difficulty swallowing the tablet whole please discuss with your pharmacist how to take the medication safely.  Take Eliquis exactly as prescribed by your doctor and DO NOT stop taking Eliquis without talking to the doctor who prescribed the medication.  Stopping without other medication to take  the place of Eliquis may increase your risk of developing a clot.  After discharge, you should have regular check-up appointments with your healthcare provider that is prescribing your Eliquis.  What do you do if you miss a dose? If a dose of ELIQUIS is not taken at the scheduled time, take it as soon as possible on the same day and twice-daily administration should be resumed.  The dose should not be doubled to make up for a missed dose.  Do not take more than one tablet of ELIQUIS at the same time.  Important Safety Information A possible side effect of Eliquis is bleeding. You should call your healthcare provider right away if you experience any of the following: ? Bleeding from an injury or your nose that does not stop. ? Unusual colored urine (red or dark brown) or unusual colored stools (red or black). ? Unusual bruising for unknown reasons. ? A serious fall or if you hit your head (even if there is no bleeding).  Some medicines may interact with Eliquis and might increase your risk of bleeding or clotting while on Eliquis. To help avoid this, consult your healthcare provider or pharmacist prior to using any new prescription or non-prescription medications, including herbals, vitamins, non-steroidal anti-inflammatory drugs (NSAIDs) and supplements.  This website has more information on Eliquis (apixaban): http://www.eliquis.com/eliquis/home

## 2019-09-27 NOTE — Plan of Care (Signed)

## 2019-09-27 NOTE — Anesthesia Procedure Notes (Signed)
Anesthesia Regional Block: Adductor canal block   Pre-Anesthetic Checklist: ,, timeout performed, Correct Patient, Correct Site, Correct Laterality, Correct Procedure, Correct Position, site marked, Risks and benefits discussed,  Surgical consent,  Pre-op evaluation,  At surgeon's request and post-op pain management  Laterality: Left  Prep: chloraprep       Needles:  Injection technique: Single-shot  Needle Type: Echogenic Stimulator Needle     Needle Length: 9cm  Needle Gauge: 21     Additional Needles:   Narrative:  Start time: 09/27/2019 12:35 PM End time: 09/27/2019 12:45 PM Injection made incrementally with aspirations every 5 mL.  Performed by: Personally  Anesthesiologist: Lillia Abed, MD  Additional Notes: Monitors applied. Patient sedated. Sterile prep and drape,hand hygiene and sterile gloves were used. Relevant anatomy identified.Needle position confirmed.Local anesthetic injected incrementally after negative aspiration. Local anesthetic spread visualized around nerve(s). Vascular puncture avoided. No complications. Image printed for medical record.The patient tolerated the procedure well.    Lillia Abed MD

## 2019-09-28 ENCOUNTER — Inpatient Hospital Stay (HOSPITAL_COMMUNITY): Payer: No Typology Code available for payment source

## 2019-09-28 ENCOUNTER — Encounter: Payer: Self-pay | Admitting: *Deleted

## 2019-09-28 DIAGNOSIS — M7989 Other specified soft tissue disorders: Secondary | ICD-10-CM

## 2019-09-28 LAB — CBC
HCT: 29.1 % — ABNORMAL LOW (ref 39.0–52.0)
Hemoglobin: 8.8 g/dL — ABNORMAL LOW (ref 13.0–17.0)
MCH: 26.1 pg (ref 26.0–34.0)
MCHC: 30.2 g/dL (ref 30.0–36.0)
MCV: 86.4 fL (ref 80.0–100.0)
Platelets: 258 10*3/uL (ref 150–400)
RBC: 3.37 MIL/uL — ABNORMAL LOW (ref 4.22–5.81)
RDW: 14.3 % (ref 11.5–15.5)
WBC: 8 10*3/uL (ref 4.0–10.5)
nRBC: 0 % (ref 0.0–0.2)

## 2019-09-28 LAB — BASIC METABOLIC PANEL
Anion gap: 8 (ref 5–15)
BUN: 16 mg/dL (ref 6–20)
CO2: 28 mmol/L (ref 22–32)
Calcium: 8.8 mg/dL — ABNORMAL LOW (ref 8.9–10.3)
Chloride: 100 mmol/L (ref 98–111)
Creatinine, Ser: 0.87 mg/dL (ref 0.61–1.24)
GFR calc Af Amer: >60 mL/min (ref >60)
GFR calc non Af Amer: >60 mL/min (ref >60)
Glucose, Bld: 114 mg/dL — ABNORMAL HIGH (ref 70–99)
Potassium: 3.7 mmol/L (ref 3.5–5.1)
Sodium: 136 mmol/L (ref 135–145)

## 2019-09-28 MED ORDER — VANCOMYCIN HCL 1250 MG/250ML IV SOLN
1250.0000 mg | Freq: Two times a day (BID) | INTRAVENOUS | Status: DC
  Administered 2019-09-28 – 2019-09-29 (×2): 1250 mg via INTRAVENOUS
  Filled 2019-09-28 (×3): qty 250

## 2019-09-28 MED ORDER — SODIUM CHLORIDE 0.9% FLUSH
10.0000 mL | INTRAVENOUS | Status: DC | PRN
  Administered 2019-09-30: 10 mL

## 2019-09-28 MED ORDER — CHLORHEXIDINE GLUCONATE CLOTH 2 % EX PADS
6.0000 | MEDICATED_PAD | Freq: Every day | CUTANEOUS | Status: DC
  Administered 2019-09-29 – 2019-09-30 (×2): 6 via TOPICAL

## 2019-09-28 MED ORDER — VANCOMYCIN HCL 2000 MG/400ML IV SOLN
2000.0000 mg | Freq: Once | INTRAVENOUS | Status: AC
  Administered 2019-09-28: 2000 mg via INTRAVENOUS
  Filled 2019-09-28: qty 400

## 2019-09-28 MED ORDER — OXYCODONE HCL 5 MG PO TABS
20.0000 mg | ORAL_TABLET | ORAL | Status: DC | PRN
  Administered 2019-09-28 – 2019-09-30 (×13): 20 mg via ORAL
  Filled 2019-09-28 (×13): qty 4

## 2019-09-28 MED ORDER — OXYCODONE HCL 5 MG PO TABS: 10.0000 mg | ORAL_TABLET | ORAL | Status: DC | PRN

## 2019-09-28 MED ORDER — SODIUM CHLORIDE 0.9 % IV SOLN
2.0000 g | INTRAVENOUS | Status: DC
  Administered 2019-09-28 – 2019-09-29 (×2): 2 g via INTRAVENOUS
  Filled 2019-09-28 (×2): qty 2

## 2019-09-28 NOTE — Progress Notes (Signed)
PT Cancellation Note  Patient Details Name: LENZY VRANICH MRN: [id number] DOB: [birth year]   Cancelled Treatment:    Reason Eval/Treat Not Completed: Pain limiting ability to participate, patient declines due to pain/ NOTED- PWB on Left/50%.  [name], could you clarify if any Left knee ROM is allowed. Thank You/   Claretha Cooper 09/28/2019, 3:27 PM  Tresa Endo [place] Pager [phone] Office [phone]

## 2019-09-28 NOTE — Progress Notes (Signed)
Bilateral lower extremity venous duplex completed. Refer to "CV Proc" under chart review to view preliminary results.  09/28/2019 9:50 AM Kelby Aline., MHA, RVT, RDCS, RDMS

## 2019-09-28 NOTE — Plan of Care (Signed)

## 2019-09-28 NOTE — Consult Note (Signed)
WOC Nurse Consult Note: Reason for Consult: left heel pressure injury Patient self reports pressure injury occurred with last surgery on the left leg He has Prevalon boot at home, explained rationale and necessity to keep left heel off the bed and/or pillows at all times.  Wound type:Unstageable Pressure Injury Pressure Injury POA: Yes Measurement: 4.5cm x 5cm x 0cm  Wound bed: 100% eschar Drainage (amount, consistency, odor) none Periwound:intact, dry skin  Dressing procedure/placement/frequency: Continue offloading left heel at all times Paint left heel ulcer with betadine daily, allow to air dry.    Discussed POC with patient and bedside nurse.  Re consult if needed, will not follow at this time. Thanks  Tesla Keeler [employer], RN,CWOCN, CNS, [place] [phone])

## 2019-09-28 NOTE — Progress Notes (Signed)
Peripherally Inserted Central Catheter/Midline Placement  The IV Nurse has discussed with the patient and/or persons authorized to consent for the patient, the purpose of this procedure and the potential benefits and risks involved with this procedure.  The benefits include less needle sticks, lab draws from the catheter, and the patient may be discharged home with the catheter. Risks include, but not limited to, infection, bleeding, blood clot (thrombus formation), and puncture of an artery; nerve damage and irregular heartbeat and possibility to perform a PICC exchange if needed/ordered by physician.  Alternatives to this procedure were also discussed.  Bard Power PICC patient education guide, fact sheet on infection prevention and patient information card has been provided to patient /or left at bedside.    PICC/Midline Placement Documentation  PICC Single Lumen [id number] PICC Right Basilic 42 cm 0 cm (Active)  Indication for Insertion or Continuance of Line Home intravenous therapies (PICC only) 09/28/19 1738  Exposed Catheter (cm) 0 cm 09/28/19 1738  Site Assessment Clean;Dry;Intact 09/28/19 1738  Line Status Flushed;Blood return noted 09/28/19 1738  Dressing Type Transparent 09/28/19 1738  Dressing Status Clean;Dry;Intact;Antimicrobial disc in place 09/28/19 1738  Dressing Intervention New dressing 09/28/19 1738  Dressing Change Due 10/05/19 09/28/19 1738       Vernon Frye 09/28/2019, 5:40 PM

## 2019-09-28 NOTE — TOC Initial Note (Signed)
Transition of Care [facility]) - Initial/Assessment Note    Patient Details  Name: Vernon Frye MRN: [id number] Date of Birth: 11-07-1960  Transition [facility]) CM/SW Contact:    Lia Hopping, [place] Phone Number: 09/28/2019, 12:44 PM  Clinical Narrative:                 CSW met with the patient at beside to discuss home IV infusion and therapy. Patient reports he is agreeable to care at home but is concern about how much he can do because he lives alone. Patient reports his wife died 69 years ago. Patient is hoping to work with therapy.  CSW received a call from the workers comp. case manager Randal Buba w/ [facility] [phone]. She reports she has been working with the patient for the past six years and will assist with IV infusion and Home Health needs. She request csw fax H&P, H.H orders and OP note to [phone]. CSW faxed all, except Op which is incomplete at this time.   CM request to complete paperwork with the patient at bedside. CSW confirmed with charge nurse the CM is allowed to come to the unit to complete necessary documents.  Patient aware and agreeable to the plan.  CSW reached out to [employer] with [facility] and provided the CM contact information.   TOC staff will continue to assist with disposition.   Expected Discharge Plan: [place] Barriers to Discharge: Continued Medical Work up, Other (comment)(Workers Comp Case Freight forwarder working with [employer].)   Patient Goals and CMS Choice   CMS Medicare.gov Compare Post Acute Care list provided to:: Patient Choice offered to / list presented to : Patient  Expected Discharge Plan and Services Expected Discharge Plan: [place] In-house Referral: Clinical Social Work Discharge Planning Services: CM Consult Post Acute Care Choice: [place] arrangements for the past 2 months: Single Family Home                           HH Arranged: PT, RN [facility]  Agency: Other - See comment([facility]/Workers Comp.) Date HH Agency Contacted: 09/28/19 Time HH Agency Contacted: 11    Prior Living Arrangements/Services Living arrangements for the past 2 months: Single Family Home Lives with:: Self Patient language and need for interpreter reviewed:: No Do you feel safe going back to the place where you live?: Yes      Need for Family Participation in Patient Care: Yes (Comment) Care giver support system in place?: No (comment) Current home services: DME, Home PT Criminal Activity/Legal Involvement Pertinent to Current Situation/Hospitalization: No - Comment as needed  Activities of Daily Living Home Assistive Devices/Equipment: Bedside commode/3-in-1, Eyeglasses, Walker (specify type), Cane (specify quad or straight) ADL Screening (condition at time of admission) Patient's cognitive ability adequate to safely complete daily activities?: Yes Is the patient deaf or have difficulty hearing?: No Does the patient have difficulty seeing, even when wearing glasses/contacts?: No Does the patient have difficulty concentrating, remembering, or making decisions?: No Patient able to express need for assistance with ADLs?: Yes Does the patient have difficulty dressing or bathing?: No Independently performs ADLs?: Yes (appropriate for developmental age) Does the patient have difficulty walking or climbing stairs?: No Weakness of Legs: Left Weakness of Arms/Hands: None  Permission Sought/Granted Permission sought to share information with : Case Manager Permission granted to share information with : Yes, Verbal Permission Granted  Emotional Assessment Appearance:: Appears stated age Attitude/Demeanor/Rapport: Engaged Affect (typically observed): Accepting Orientation: : Oriented to Self, Oriented to Place, Oriented to  Time, Oriented to Situation Alcohol / Substance Use: Not Applicable Psych Involvement: No (comment)  Admission  diagnosis:  Septic joint of left knee joint ([place]) [M00.9] Patient Active Problem List   Diagnosis Date Noted  . Malnutrition of moderate degree 09/05/2019  . 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 Acute bilateral low back pain without sciatica   . MRSA bacteremia 07/14/2019  . 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 Acute pain of left knee   . Septic joint of left knee joint ([place]) 07/13/2019  . Atrial fibrillation with rapid ventricular response ([place]) 07/13/2019  . Alcohol use 07/13/2019  . Hypotension   . 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 S/P knee replacement 06/19/2015  . Rotator cuff tear 01/16/2014  . HYPERTENSION 01/31/2010  . PNEUMONIA 01/31/2010  . SWELLING, MASS, OR LUMP IN CHEST 01/31/2010   PCP:  Nicholes Rough, PA-C Pharmacy:   [id number] [place], [place] - [address] [place] [address]Phone: [phone]: [phone]    Social Determinants of Health (SDOH) Interventions    Readmission Risk Interventions No flowsheet data found.

## 2019-09-28 NOTE — Progress Notes (Addendum)
     Subjective: 1 Day Post-Op Procedure(s) (LRB): Left knee repeat irrigation and debridement with spacer (Left)   Patient reports pain as moderate/severe, states that when he was Dr. Hardin Negus he is taking significantly more analgesic medications.  Other than pain no other reported events throughout the night.  Discussed procedure and expectations moving forward.  Patient plan to be determined as you progress through the process.    Objective:   VITALS:   Vitals:   09/28/19 0112 09/28/19 0505  BP: 135/83 137/86  Pulse: 80 86  Resp: 16 16  Temp: 98 F (36.7 C) 97.8 F (36.6 C)  SpO2: 100% 100%    Incision: dressing C/D/I Compartment soft  LABS Recent Labs    09/27/19 1111 09/28/19 0413  HGB 12.5* 8.8*  HCT 40.0 29.1*  WBC 7.4 8.0  PLT 373 258    Recent Labs    09/27/19 1111 09/28/19 0413  NA 141 136  K 4.2 3.7  BUN 14 16  CREATININE 0.70 0.87  GLUCOSE 129* 114*     Assessment/Plan: 1 Day Post-Op Procedure(s) (LRB): Left knee repeat irrigation and debridement with spacer (Left) Foley cath d/c'ed Advance diet Up with therapy D/C IV fluids Discharge disposition to be determined   Left heel decubitus Left heel decubitus with eschar Developed since his previous surgery Wound care consult ordered Would appreciate any input with regards to care for the area     Danae Orleans PA-C  [facility]  [place] [address], [place], [place] [address] Phone: [phone] [email or web address] Facebook  [employer]

## 2019-09-28 NOTE — Progress Notes (Signed)
Pharmacy Antibiotic Note  Vernon Frye is a 59 y.o. male admitted on 09/27/2019 with septic knee.  He is s/p antibiotic spacer placement for infected L TKA on 12/29. Culture from Knee on 07/13/2019 with MRSA (noted to also have MRSA bacteremia at same time).  Orthopedics took him back to OR 09/27/2019 for repeat I&D and new spacdred/t nonhealing area Pharmacy has been consulted for vancomycin dosing.  He is noted to have reaction to vancomycin in past. During his previous admission,  The infusion was slowed and he tolerated vancomycin. Orthopedics also wants ceftriaxone started and plan to ask ID to see patient   - per anesthesia documentation, appears vancomycin 1gm IV x2 given in OR at ~14:30  Plan:  Vancomycin 2gm IV x 1 (Over 4h infusion) then vancomycin 1250mg  IV q12h (over 152min) based on dosing/levels last month  Monitor renal function, check levels at steady state and f/u cultures  Ceftriaxone 2gm IV q24h  Height: 6\' 3"  (190.5 cm) Weight: 259 lb 14.8 oz (117.9 kg) IBW/kg (Calculated) : 84.5  Temp (24hrs), Avg:97.7 F (36.5 C), Min:96.8 F (36 C), Max:99 F (37.2 C)  Recent Labs  Lab 09/27/19 1111 09/28/19 0413  WBC 7.4 8.0  CREATININE 0.70 0.87    Estimated Creatinine Clearance: 128.2 mL/min (by C-G formula based on SCr of 0.87 mg/dL).    Allergies  Allergen Reactions  . Vancomycin Shortness Of Breath and Rash    [name] . Tolerated at lower rate during hospitalization 12/20.    Antimicrobials this admission: 3/3 ceftriaxone >> 3/3 vancomycin >>  Dose adjustments this admission:  Microbiology results: 3/2 L knee:   Thank you for allowing pharmacy to be a part of this patient's care.  Doreene Eland, PharmD, BCPS.   Work Cell: [phone] 09/28/2019 12:05 PM

## 2019-09-29 DIAGNOSIS — Z8614 Personal history of Methicillin resistant Staphylococcus aureus infection: Secondary | ICD-10-CM

## 2019-09-29 DIAGNOSIS — T8454XA Infection and inflammatory reaction due to internal left knee prosthesis, initial encounter: Principal | ICD-10-CM

## 2019-09-29 DIAGNOSIS — Z881 Allergy status to other antibiotic agents status: Secondary | ICD-10-CM

## 2019-09-29 DIAGNOSIS — Z87891 Personal history of nicotine dependence: Secondary | ICD-10-CM

## 2019-09-29 DIAGNOSIS — Z95828 Presence of other vascular implants and grafts: Secondary | ICD-10-CM

## 2019-09-29 DIAGNOSIS — Z89522 Acquired absence of left knee: Secondary | ICD-10-CM

## 2019-09-29 LAB — CBC
HCT: 24.2 % — ABNORMAL LOW (ref 39.0–52.0)
Hemoglobin: 7.6 g/dL — ABNORMAL LOW (ref 13.0–17.0)
MCH: 26.3 pg (ref 26.0–34.0)
MCHC: 31.4 g/dL (ref 30.0–36.0)
MCV: 83.7 fL (ref 80.0–100.0)
Platelets: 270 10*3/uL (ref 150–400)
RBC: 2.89 MIL/uL — ABNORMAL LOW (ref 4.22–5.81)
RDW: 13.9 % (ref 11.5–15.5)
WBC: 12.9 10*3/uL — ABNORMAL HIGH (ref 4.0–10.5)
nRBC: 0 % (ref 0.0–0.2)

## 2019-09-29 LAB — BASIC METABOLIC PANEL
Anion gap: 11 (ref 5–15)
BUN: 18 mg/dL (ref 6–20)
CO2: 24 mmol/L (ref 22–32)
Calcium: 8.9 mg/dL (ref 8.9–10.3)
Chloride: 99 mmol/L (ref 98–111)
Creatinine, Ser: 0.78 mg/dL (ref 0.61–1.24)
GFR calc Af Amer: >60 mL/min (ref >60)
GFR calc non Af Amer: >60 mL/min (ref >60)
Glucose, Bld: 177 mg/dL — ABNORMAL HIGH (ref 70–99)
Potassium: 4.3 mmol/L (ref 3.5–5.1)
Sodium: 134 mmol/L — ABNORMAL LOW (ref 135–145)

## 2019-09-29 LAB — SURGICAL PATHOLOGY

## 2019-09-29 MED ORDER — SODIUM CHLORIDE 0.9 % IV SOLN
800.0000 mg | Freq: Every day | INTRAVENOUS | Status: DC
  Administered 2019-09-29 – 2019-09-30 (×2): 800 mg via INTRAVENOUS
  Filled 2019-09-29 (×2): qty 16

## 2019-09-29 NOTE — TOC Initial Note (Deleted)
Transition of Care [facility]) - Initial/Assessment Note    Patient Details  Name: Vernon Frye MRN: [id number] Date of Birth: 1961/03/25  Transition of Care [facility]) CM/SW Contact:    Lia Hopping, [employer] Phone Number: 09/29/2019, 10:46 AM  Clinical Narrative:                 CSW received a call from [facility]- [phone] ext. [phone]. He is actively coordinating [facility] and IV infusion for the patient. He reports they will provide a IV infusion company. CSW faxed requested clinicals for review to [phone] [facility] staff will continue to follow.   Expected Discharge Plan: Vernon Barriers to Discharge: Continued Medical Work up, Other (comment)(Workers Comp Case Freight forwarder working with [employer].)   Patient Goals and CMS Choice   CMS Medicare.gov Compare Post Acute Care list provided to:: Patient Choice offered to / list presented to : Patient  Expected Discharge Plan and Services Expected Discharge Plan: [place] In-house Referral: Clinical Social Work Discharge Planning Services: CM Consult Post Acute Care Choice: Outlook arrangements for the past 2 months: Single Family Home                           HH Arranged: PT, RN [facility] Agency: Other - See comment([facility]/Workers Comp.) Date HH Agency Contacted: 09/28/19 Time HH Agency Contacted: 26    Prior Living Arrangements/Services Living arrangements for the past 2 months: Single Family Home Lives with:: Self Patient language and need for interpreter reviewed:: No Do you feel safe going back to the place where you live?: Yes      Need for Family Participation in Patient Care: Yes (Comment) Care giver support system in place?: No (comment) Current home services: DME, Home PT Criminal Activity/Legal Involvement Pertinent to Current Situation/Hospitalization: No - Comment as needed  Activities of Daily Living Home Assistive Devices/Equipment:  Bedside commode/3-in-1, Eyeglasses, Walker (specify type), Cane (specify quad or straight) ADL Screening (condition at time of admission) Patient's cognitive ability adequate to safely complete daily activities?: Yes Is the patient deaf or have difficulty hearing?: No Does the patient have difficulty seeing, even when wearing glasses/contacts?: No Does the patient have difficulty concentrating, remembering, or making decisions?: No Patient able to express need for assistance with ADLs?: Yes Does the patient have difficulty dressing or bathing?: No Independently performs ADLs?: Yes (appropriate for developmental age) Does the patient have difficulty walking or climbing stairs?: No Weakness of Legs: Left Weakness of Arms/Hands: None  Permission Sought/Granted Permission sought to share information with : Case Manager Permission granted to share information with : Yes, Verbal Permission Granted              Emotional Assessment Appearance:: Appears stated age Attitude/Demeanor/Rapport: Engaged Affect (typically observed): Accepting Orientation: : Oriented to Self, Oriented to Place, Oriented to  Time, Oriented to Situation Alcohol / Substance Use: Not Applicable Psych Involvement: No (comment)  Admission diagnosis:  Septic joint of left knee joint ([place]) [M00.9] Patient Active Problem List   Diagnosis Date Noted  . Malnutrition of moderate degree 09/05/2019  . 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 Acute bilateral low back pain without sciatica   . MRSA bacteremia 07/14/2019  . 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 Acute pain of left knee   . Septic joint of left knee joint ([place]) 07/13/2019  . Atrial fibrillation with rapid ventricular  response ([place]) 07/13/2019  . Alcohol use 07/13/2019  . Hypotension   . 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 S/P knee replacement 06/19/2015  . Rotator cuff tear 01/16/2014  . HYPERTENSION 01/31/2010  . PNEUMONIA 01/31/2010  . SWELLING, MASS,  OR LUMP IN CHEST 01/31/2010   PCP:  Nicholes Rough, PA-C Pharmacy:   [id number] - [place], [place] - [address] [place] [address] Phone: [phone] Fax: [phone]     Social Determinants of Health (SDOH) Interventions    Readmission Risk Interventions No flowsheet data found.

## 2019-09-29 NOTE — Addendum Note (Signed)
Addendum  created 09/29/19 0604 by Lillia Abed, MD   Child order released for a procedure order, Clinical Note Signed, Intraprocedure Blocks edited

## 2019-09-29 NOTE — Progress Notes (Signed)
     Subjective: 2 Days Post-Op Procedure(s) (LRB): Left knee repeat irrigation and debridement with spacer (Left)   Patient reports pain as moderate. patient feels that the knee is doing better with the spacer versus his previous spacer.  Feels that his pain is much better controlled.  No reported events throughout the night.  We have discussed his procedure as well as expectations moving forward.  Discussed his heel decubitus I appreciate wound care feedback.  Discussed his AFO brace, and that he cannot wear it due to pressure on his heel and increasing the pain in that area.     Objective:   VITALS:   Vitals:   09/28/19 2054 09/29/19 0515  BP: 137/81 120/80  Pulse: 82 66  Resp: 18 18  Temp: 98.9 F (37.2 C) 97.9 F (36.6 C)  SpO2: 97% 100%    Incision: scant drainage No cellulitis present Compartment soft  LABS Recent Labs    09/27/19 1111 09/28/19 0413 09/29/19 0326  HGB 12.5* 8.8* 7.6*  HCT 40.0 29.1* 24.2*  WBC 7.4 8.0 12.9*  PLT 373 258 270    Recent Labs    09/27/19 1111 09/28/19 0413 09/29/19 0326  NA 141 136 134*  K 4.2 3.7 4.3  BUN 14 16 18   CREATININE 0.70 0.87 0.78  GLUCOSE 129* 114* 177*     Assessment/Plan: 2 Days Post-Op Procedure(s) (LRB): Left knee repeat irrigation and debridement with spacer (Left)  1.  Infected left knee with nonhealing wound  IV antibiotics for right antibiotics per ID consult  Lasix daily as needed for swelling  Knee immobilizer on the knee unless he is moving the knee for exercises, this will allow the incision to remain in place and heal   2.  Left heel decubitus  Treatment according to wound care nurse  Daily Betadine cleaning  Elevate the leg  Keep pressure off the area   3.  Foot drop, left  We will order a new AFO brace with an open area in the heel  This will decrease the pressure across the heel  Stretching of the foot with physical therapy     Danae Orleans PA-C  [facility]   [place] [address], [address], [place], [place] [address] Phone: [phone] [email or web address] Facebook  [employer]

## 2019-09-29 NOTE — Progress Notes (Signed)
PHARMACY CONSULT NOTE FOR:  OUTPATIENT  PARENTERAL ANTIBIOTIC THERAPY (OPAT)  Indication: prosthetic joint infection of knee Regimen: Daptomycin 800mg  IV q24h End date: 11/08/2019  IV antibiotic discharge orders are pended. To discharging provider:  please sign these orders via discharge navigator,  Select New Orders & click on the button choice - Manage This Unsigned Work.     Thank you for allowing pharmacy to be a part of this patient's care.  Doreene Eland, PharmD, BCPS.   Work Cell: [phone] 09/29/2019 4:24 PM

## 2019-09-29 NOTE — Anesthesia Procedure Notes (Signed)
Spinal  Start time: 09/27/2019 2:00 PM End time: 09/27/2019 2:03 PM Staffing Performed: anesthesiologist  Anesthesiologist: Lillia Abed, MD Preanesthetic Checklist Completed: patient identified, IV checked, risks and benefits discussed, surgical consent, monitors and equipment checked, pre-op evaluation and timeout performed Spinal Block Patient position: sitting Prep: DuraPrep Patient monitoring: blood pressure, continuous pulse ox, cardiac monitor and heart rate Approach: right paramedian Location: L3-4 Injection technique: single-shot Needle Needle type: Pencan  Needle gauge: 24 G Needle length: 9 cm Needle insertion depth: 8 cm

## 2019-09-29 NOTE — Op Note (Signed)
NAME: DEMONT, Vernon Frye MEDICAL RECORD [id number] ACCOUNT [id number] DATE OF BIRTH:May 17, 1961 FACILITY: [facility] LOCATION: [facility] PHYSICIAN:Nava Song D. Mariza Bourget, MD  OPERATIVE REPORT  DATE OF PROCEDURE:  09/27/2019  PREOPERATIVE DIAGNOSIS:  Persistent/recurrent infection, left total knee replacement, status post resection of total knee replacement, placement of antibiotic spacer.  POSTOPERATIVE DIAGNOSES:   1.  Persistent/recurrent infection, left total knee replacement, status post resection of total knee replacement, placement of antibiotic spacer. 2.  Findings also included an open wound at the distal aspect of the incision with granulating tissue present    PROCEDURES:   1.  Redo excisional and nonexcisional debridement, left knee, including skin of a 10-inch incision, subcutaneous tissue, nonviable scar tissue within the knee, bone and soft tissue. 2.  Revision resection arthroplasty removing old cemented antibiotic spacer and placement of an articulating antibiotic spacer utilizing 3 g of vancomycin and 2.4 of tobramycin.  SURGEON:  Paralee Cancel, MD  ASSISTANT:  Karen Chafe, PA-C.  Note that Ms. Stinsen was present for the entirety of the case from preoperative positioning, perioperative management of the operative extremity, general facilitation of the case and primary wound closure.  ANESTHESIA:  Regional plus spinal.  SPECIMENS:  Joint fluid and tissue sent to pathology for evaluation for culture.  DRAINS:  None.  TOURNIQUET:  The tourniquet was up for 60 minutes at 250 mmHg.  ESTIMATED BLOOD LOSS:  Probably 200-300 mL following tourniquet let down.  INDICATIONS:  The patient is a 59 year old male who had an index left total knee replacement performed 5-6 years ago.  He was in his normal state of health until mid to late December.  He presented to the [facility] with a septic picture.  He was  diagnosed with MRSA sepsis that infected his left knee.  At initial  evaluation, he was taken to the operating room to debride his knee due to the severity of his illness.  Within a week or 2, it was evident that he had persistent recurrent problems with  his knee complicated by his medical health.  He was then taken back to the operating room for resection of his knee and placement of antibiotic spacer.  He then remained in the hospital for an additional 6 weeks, requiring IV antibiotics due to the  limited resources at home.  He eventually made it through the acuity of his illness and was stable enough for discharge.  He was seen in the office in routine followup and noted to have persistent issues with the distal aspect of his wound.  At this  point, he was deemed not to be safe to proceed with reimplantation, instead that we would need to redo the antibiotic spacer, as well as debride his knee to try to get his skin to heal.  We reviewed the risks of persistent infection, the postoperative  course and expectation, pain medication use, IV antibiotic use.  Consent was obtained for benefit of pain relief and healing of the wound.  PROCEDURE IN DETAIL:  The patient was brought to the operative theater.  Once adequate anesthesia, preoperative antibiotics initially given Ancef, but subsequently vancomycin, he was then positioned supine with a left thigh tourniquet placed.  The left  lower extremity was prepped and draped in sterile fashion.  We did not prep down to his ankle.  He was noted to have a left heel decubitus wound, as well as significant left lower extremity swelling and foot drop.  The left lower extremity was otherwise  prepped and draped in sterile  fashion.  A timeout was performed identifying the patient, the planned procedure and extremity.  A midline incision was made from previous incision and then extended distally to incorporate the area of the nonhealing wound  to allow [place] to try to get wound closure. This area was ellipsed out, including the subcutaneous  tissue.  Soft tissue planes then created.  No significant scarring.  A median arthrotomy was then made.  Here, I did not see a significant amount of swelling  ____.  There was some bloody fluid in the knee that we did drain and sent to pathology to evaluate.  The area in the infrapatellar region was fairly weakened and nonviable in nature in the area from persistent drainage, most likely.  I had to debride  some of this area back to a more normal tissue plane.  Soft tissue exposure obtained, including a significant scar debridement in the suprapatellar pouch, the medial and lateral gutters, including proximal medial peels.  Once I was able to expose the  knee, I removed the old cemented block, both in the distal femur and tibia.  I then did a significant debridement of the distal femur and tibia, including minimal resection of bone with a saw, as well as a curette and other instruments.  I then opened up  the canals and irrigated these with a pulse lavage irrigator, both the distal femur and proximal tibia.  I then was able to partially evert the patella and debride around the patella of all scar tissue and fibrous tissue over top of this.  Once the knee  was significantly and adequately debrided, we irrigated the knee with 6 L of normal saline solution.  We then followed this with Irrisept fluid.  While this was going on, we determined that a size large KASM articulating spacer mold would be utilized.   This was opened and at that point, we mixed cement.  The cement was determined to be 10 mm thick in the tibia and we used a large mold on the femur.  I held the mold on the distal femur until the cement fully cured.  We then peeled off the mold and then  mixed a separate batch of cement to cement the tibial component onto the tibia.  The knee was brought to extension.  Any extruding cement was removed.  Once the cement fully cured, the tourniquet was let down after 60 minutes.  Some hemostasis was  required.   At this point, we reapproximated the extensor mechanism using a combination of multiple #1 Vicryl sutures and it as a challenge to get it reapproximated at the infrapatellar region due to the erosion from persistent drainage.  The remainder of  the wound was then closed with 2-0 Vicryl and a running Monocryl stitch.  The knee was cleaned, dried, dressed sterilely using surgical glue and Aquacel dressing.  The patient was brought to the recovery room with a bulky sterile wrap and knee  immobilizer in place with his heels elevated off the bed.  He will be hospitalized for 2-3 days to work on home discharge with antibiotics and other needs.  VN/NUANCE  D:09/28/2019 Frye:09/28/2019 JOB:[id number]

## 2019-09-29 NOTE — Consult Note (Addendum)
[facility] for Infectious Disease       Reason for Consult:PJI    Referring Physician: [name]  Active Problems:   Septic joint of left knee joint ([place])   . amiodarone  200 mg Oral Daily  . apixaban  2.5 mg Oral Q12H  . celecoxib  200 mg Oral BID  . Chlorhexidine Gluconate Cloth  6 each Topical Daily  . docusate sodium  100 mg Oral BID  . ferrous sulfate  325 mg Oral BID WC  . furosemide  20 mg Oral Daily  . lisinopril  40 mg Oral Daily   And  . hydrochlorothiazide  25 mg Oral Daily  . metoprolol succinate  100 mg Oral Daily  . polyethylene glycol  17 g Oral BID    Recommendations: ADDENDUM:  Will use daptomycin IV 6 mg/kg daily for the 6 weeks through April 13th Weekly CK level, cmp, cbc; CRP, ESR every 2 weeks Hold daptomycin for CK level over 200 picc line in place  Assessment: He has a PJI and has received 6 weeks of IV vancomycin with a recurrent infection sp operative debridement again yesterday with resection arthroplasty and spacer placement.   Will need another 6 weeks.  Antibiotics: Vancomycin and ceftriaxone  HPI: Vernon Frye is a 59 y.o. 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  He was initially septic and underwent polyexchange 07/13/19 then resection arthroplasty 12/29 and completed 6 weeks of IV vancomycin.  He remained in the hospital for the 6 weeks of IV therapy.  After discharge, he fell and had further issues with his knee and swelling and went back to the OR yesterday for repeat I and D with spacer placement.  Cultures sent and ngtd.  No associated fever or rash.    Review of Systems:  Constitutional: negative for fevers and chills Gastrointestinal: negative for nausea and diarrhea Integument/breast: negative for rash All other systems reviewed and are negative    Past Medical History:  Diagnosis Date  . Anxiety    takes Xanax daily  .  Arthritis   . Chronic back pain    DDD  . Complication of anesthesia    agitated when awaking   . H/O rotator cuff tear   . History of kidney stones   . Hypertension    takes Toprol,Prinizide,and Amlodipine daily  . Joint pain   . Joint swelling   . Laceration    history of to right lower extermity   . Night muscle spasms    takes Soma daily  . Numbness and tingling    hands and feet   . Pneumonia    history of   . Sepsis ([place]) 09/07/2019  . Tinnitus     Social History   Tobacco Use  . Smoking status: Former Smoker    Packs/day: 0.25    Years: 7.00    Pack years: 1.75    Types: Cigarettes    Quit date: 07/28/2004    Years since quitting: 15.1  . Smokeless tobacco: Never Used  . Tobacco comment: quit smoking 8 yrs ago  Substance Use Topics  . Alcohol use: Yes    Comment: 3 beers daily  . Drug use: No    Family History  Problem Relation Age of Onset  . Heart disease Other     Allergies  Allergen Reactions  . Vancomycin Shortness Of Breath and Rash  [name] . Tolerated at lower rate during hospitalization 12/20.    Physical Exam: Constitutional: in no apparent distress  Vitals:   09/29/19 0515 09/29/19 1325  BP: 120/80 (!) 142/85  Pulse: 66 75  Resp: 18   Temp: 97.9 F (36.6 C) 97.6 F (36.4 C)  SpO2: 100% 100%   EYES: anicteric ENMT: no thrush Cardiovascular: Cor RRR Respiratory: clear; GI: Bowel sounds are normal, liver is not enlarged, spleen is not enlarged Musculoskeletal: no pedal edema noted Skin: negatives: no rash Neuro: non-focal  Lab Results  Component Value Date   WBC 12.9 (H) 09/29/2019   HGB 7.6 (L) 09/29/2019   HCT 24.2 (L) 09/29/2019   MCV 83.7 09/29/2019   PLT 270 09/29/2019    Lab Results  Component Value Date   CREATININE 0.78 09/29/2019   BUN 18 09/29/2019   NA 134 (L) 09/29/2019   K 4.3 09/29/2019   CL 99 09/29/2019   CO2 24 09/29/2019    Lab Results  Component Value Date   ALT 19 09/27/2019   AST  18 09/27/2019   ALKPHOS 74 09/27/2019     Microbiology: Recent Results (from the past 240 hour(s))  SARS CORONAVIRUS 2 (TAT 6-24 HRS) Nasopharyngeal Nasopharyngeal Swab     Status: None   Collection Time: 09/23/19  2:06 PM   Specimen: Nasopharyngeal Swab  Result Value Ref Range Status   SARS Coronavirus 2 NEGATIVE NEGATIVE Final    Comment: (NOTE) SARS-CoV-2 target nucleic acids are NOT DETECTED. The SARS-CoV-2 RNA is generally detectable in upper and lower respiratory specimens during the acute phase of infection. Negative results do not preclude SARS-CoV-2 infection, do not rule out co-infections with other pathogens, and should not be used as the sole basis for treatment or other patient management decisions. Negative results must be combined with clinical observations, patient history, and epidemiological information. The expected result is Negative. Fact Sheet for Patients: SugarRoll.be Fact Sheet for Healthcare Providers: https://www.woods-mathews.com/ This test is not yet approved or cleared by the [place] FDA and  has been authorized for detection and/or diagnosis of SARS-CoV-2 by FDA under an Emergency Use Authorization (EUA). This EUA will remain  in effect (meaning this test can be used) for the duration of the COVID-19 declaration under Section 56 4(b)(1) of the Act, 21 U.S.C. section 360bbb-3(b)(1), unless the authorization is terminated or revoked sooner. Performed at [facility], [place] [address] [place], [place] [address]   Aerobic/Anaerobic Culture (surgical/deep wound)     Status: None (Preliminary result)   Collection Time: 09/27/19  2:39 PM   Specimen: PATH Cytology Misc. fluid; Body Fluid  Result Value Ref Range Status   Specimen Description   Final    SYNOVIAL LEFT KNEE Performed at [facility] [address], [place], [place] [address]    Special Requests   Final     NONE Performed at [facility], [place] [address] [place], [place] [address]    Gram Stain   Final    MODERATE WBC PRESENT, PREDOMINANTLY MONONUCLEAR NO ORGANISMS SEEN    Culture   Final    NO GROWTH 2 DAYS Performed at [facility] [address] [place], [place] [address]    Report Status PENDING  Incomplete    Thayer Headings, [facility] [place] [email or web address] 09/29/2019, 3:15 PM

## 2019-09-29 NOTE — Evaluation (Signed)
Physical Therapy Evaluation Patient Details Name: Vernon Frye MRN: [id number] DOB: 07/08/1961 Today's Date: 09/29/2019   History of Present Illness  Pt s/p second antibiotic spacer implantation L knee.  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  Clinical Impression  Pt admitted as above and presenting with functional mobility limitations 2* decreased L LE strength/ROM, post op pain, and PWB limitations on L LE.  Pt should progress to dc to previous living arrangement.    Follow Up Recommendations Follow surgeon's recommendation for DC plan and follow-up therapies    Equipment Recommendations  None recommended by PT    Recommendations for Other Services       Precautions / Restrictions Precautions Precautions: Fall Required Braces or Orthoses: Knee Immobilizer - Left Knee Immobilizer - Left: On at all times Restrictions Weight Bearing Restrictions: Yes LLE Weight Bearing: Partial weight bearing LLE Partial Weight Bearing Percentage or Pounds: 50% Other Position/Activity Restrictions: ROM at R knee as tolerated per [employer] PA      Mobility  Bed Mobility Overal bed mobility: Needs Assistance Bed Mobility: Supine to Sit     Supine to sit: Min assist     General bed mobility comments: increased time with min assist to manage L LE  Transfers Overall transfer level: Needs assistance Equipment used: Rolling walker (2 wheeled);Crutches Transfers: Sit to/from Stand Sit to Stand: Min assist;+2 physical assistance         General transfer comment: min assist to bring wt up and forward  Ambulation/Gait Ambulation/Gait assistance: Min assist Gait Distance (Feet): 36 Feet Assistive device: Rolling walker (2 wheeled) Gait Pattern/deviations: Step-to pattern Gait velocity: decr   General Gait Details: cues for sequence, posture, position from RW and increased heel contact on L  Stairs            Wheelchair Mobility    Modified Rankin (Stroke Patients  Only)       Balance Overall balance assessment: Needs assistance Sitting-balance support: Feet supported Sitting balance-Leahy Scale: Good     Standing balance support: Bilateral upper extremity supported;During functional activity Standing balance-Leahy Scale: Poor Standing balance comment: reliant on external support                             Pertinent Vitals/Pain Pain Assessment: 0-10 Pain Score: 7  Pain Location: L knee Pain Descriptors / Indicators: Aching;Sore;Guarding Pain Intervention(s): Limited activity within patient's tolerance;Monitored during session;Premedicated before session;Ice applied    Home Living Family/patient expects to be discharged to:: Private residence Living Arrangements: Alone Available Help at Discharge: Friend(s) Type of Home: House Home Access: Ramped entrance     Home Layout: One level Home Equipment: Environmental consultant - 2 wheels;Crutches;Bedside commode;Wheelchair - manual      Prior Function Level of Independence: Independent with assistive device(s)         Comments: Pt largely in wc since previous spacer placement several months ago     Hand Dominance   Dominant Hand: Right    Extremity/Trunk Assessment   Upper Extremity Assessment Upper Extremity Assessment: Overall WFL for tasks assessed    Lower Extremity Assessment Lower Extremity Assessment: RLE deficits/detail RLE Deficits / Details: R foot drop with AAROM into DF -10 limited by knee pain    Cervical / Trunk Assessment Cervical / Trunk Assessment: Normal  Communication   Communication: No difficulties  Cognition Arousal/Alertness: Awake/alert Behavior During Therapy: WFL for tasks assessed/performed Overall Cognitive Status: Within Functional Limits  for tasks assessed                                        General Comments      Exercises Total Joint Exercises Ankle Circles/Pumps: Left;AAROM;20 reps;Supine Quad Sets: AROM;Left;10  reps;Supine   Assessment/Plan    PT Assessment Patient needs continued PT services  PT Problem List Decreased strength;Decreased range of motion;Decreased activity tolerance;Decreased balance;Decreased cognition;Pain       PT Treatment Interventions DME instruction;Gait training;Functional mobility training;Therapeutic activities;Therapeutic exercise;Balance training;Patient/family education;Stair training    PT Goals (Current goals can be found in the Care Plan section)  Acute Rehab PT Goals Patient Stated Goal: Regain IND PT Goal Formulation: With patient Time For Goal Achievement: 09/19/19 Potential to Achieve Goals: Fair    Frequency Min 5X/week   Barriers to discharge Decreased caregiver support lives alone    Co-evaluation               AM-PAC PT "6 Clicks" Mobility  Outcome Measure Help needed turning from your back to your side while in a flat bed without using bedrails?: A Little Help needed moving from lying on your back to sitting on the side of a flat bed without using bedrails?: A Little Help needed moving to and from a bed to a chair (including a wheelchair)?: A Little Help needed standing up from a chair using your arms (e.g., wheelchair or bedside chair)?: A Lot Help needed to walk in hospital room?: A Little Help needed climbing 3-5 steps with a railing? : A Lot 6 Click Score: 16    End of Session Equipment Utilized During Treatment: Left knee immobilizer;Gait belt Activity Tolerance: Patient tolerated treatment well Patient left: Other (comment)(sitting EOB) Nurse Communication: Mobility status;Precautions;Weight bearing status PT Visit Diagnosis: Other abnormalities of gait and mobility (R26.89);Pain;Muscle weakness (generalized) (M62.81);Difficulty in walking, not elsewhere classified (R26.2) Pain - Right/Left: Left Pain - part of body: Knee    Time: [id number] PT Time Calculation (min) (ACUTE ONLY): 35 min   Charges:   PT Evaluation $PT  Eval Low Complexity: 1 Low PT Treatments $Gait Training: 8-22 mins        Debe Coder PT Acute Rehabilitation Services Pager [phone] Office [phone]   Saoirse Legere 09/29/2019, 12:41 PM

## 2019-09-29 NOTE — Progress Notes (Signed)
Pharmacy Antibiotic Note  Vernon Frye is a 59 y.o. male admitted on 09/27/2019 with septic knee.  He is s/p antibiotic spacer placement for infected L TKA on 12/29. Culture from Knee on 07/13/2019 with MRSA (noted to also have MRSA bacteremia at same time).  Orthopedics took him back to OR 09/27/2019 for repeat I&D and new spacdred/t nonhealing area Pharmacy has been consulted for daptomycin dosing (from vancomycin).  He is noted to have reaction to vancomycin in past. During his previous admission,  The infusion was slowed and he tolerated vancomycin. Orthopedics also wants ceftriaxone started and plan to ask ID to see patient   3/4 called to discuss antibiotics with [place] Ivar Drape).  Asked about who is responsible for adjusting vancomycin dose (infusion company vs. Prescriber).  Asked about Daptomycin and not additional cost to patient vancomycin vs daptomycin.  He is on lisinopril, HCTZ, and furosemide at home, so at higher risk for AKI with vancomycin.  Discussed above with Dr Linus Salmons with ID and will change to daptomycin  Plan:  Daptomycin 800mg  (~8 mg/kg) IV q24h per Adjusted body weight for BMI > 30  Check CK in morning then weekly CK  See OPAT  Height: 6\' 3"  (190.5 cm) Weight: 259 lb 14.8 oz (117.9 kg) IBW/kg (Calculated) : 84.5  Temp (24hrs), Avg:98.1 F (36.7 C), Min:97.6 F (36.4 C), Max:98.9 F (37.2 C)  Recent Labs  Lab 09/27/19 1111 09/28/19 0413 09/29/19 0326  WBC 7.4 8.0 12.9*  CREATININE 0.70 0.87 0.78    Estimated Creatinine Clearance: 139.4 mL/min (by C-G formula based on SCr of 0.78 mg/dL).    Allergies  Allergen Reactions  . Vancomycin Shortness Of Breath and Rash    [name] . Tolerated at lower rate during hospitalization 12/20.    Antimicrobials this admission: 3/3 ceftriaxone >> 3/4 3/3 vancomycin >> 3/4 3/4 daptomycin >>  Dose adjustments this admission:  Microbiology results: 3/2 L knee: NGTD  Thank you for  allowing pharmacy to be a part of this patient's care.  Doreene Eland, PharmD, BCPS.   Work Cell: [phone] 09/29/2019 4:18 PM

## 2019-09-29 NOTE — TOC Progression Note (Signed)
Transition of Care [facility]) - Progression Note    Patient Details  Name: Vernon Frye MRN: [id number] Date of Birth: 1960/10/19  Transition of Care [facility]) CM/SW [place], LCSW Phone Number: 09/29/2019, 10:57 AM  Clinical Narrative:    CSW received a call from [place]- [phone] ext. [phone]. He is actively coordinating [facility] and IV infusion for the patient. He reports they will provide a IV infusion company. CSW faxed requested clinicals for review to [phone] [facility] staff will continue to follow.    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

## 2019-09-29 NOTE — Progress Notes (Signed)
Physical Therapy Treatment Patient Details Name: Vernon Frye MRN: [id number] DOB: [birth year] Today's Date: 09/29/2019    History of Present Illness Pt s/p second antibiotic spacer implantation L knee.  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    PT Comments    Pt motivated and pleased with increased WB tolerance with activity.   Follow Up Recommendations  Follow surgeon's recommendation for DC plan and follow-up therapies     Equipment Recommendations  None recommended by PT    Recommendations for Other Services       Precautions / Restrictions Precautions Precautions: Fall Required Braces or Orthoses: Knee Immobilizer - Left Knee Immobilizer - Left: On at all times Restrictions Weight Bearing Restrictions: Yes LLE Weight Bearing: Partial weight bearing LLE Partial Weight Bearing Percentage or Pounds: 50% Other Position/Activity Restrictions: ROM at R knee as tolerated per Adrian Prince PA    Mobility  Bed Mobility Overal bed mobility: Needs Assistance Bed Mobility: Supine to Sit;Sit to Supine     Supine to sit: Min assist Sit to supine: Min guard   General bed mobility comments: increased time with pt managing L LE with assist of gait belt  Transfers Overall transfer level: Needs assistance Equipment used: Rolling walker (2 wheeled) Transfers: Sit to/from Stand Sit to Stand: Min assist         General transfer comment: cues for LE management and use of UEs to self assist  Ambulation/Gait Ambulation/Gait assistance: Min assist Gait Distance (Feet): 45 Feet Assistive device: Rolling walker (2 wheeled) Gait Pattern/deviations: Step-to pattern Gait velocity: decr   General Gait Details: cues for sequence, posture, position from RW and increased heel contact on L   Stairs             Wheelchair Mobility    Modified Rankin (Stroke Patients Only)       Balance Overall balance assessment: Needs assistance Sitting-balance support: Feet  supported Sitting balance-Leahy Scale: Good     Standing balance support: Bilateral upper extremity supported;During functional activity Standing balance-Leahy Scale: Poor Standing balance comment: reliant on external support                            Cognition Arousal/Alertness: Awake/alert Behavior During Therapy: WFL for tasks assessed/performed Overall Cognitive Status: Within Functional Limits for tasks assessed                                        Exercises Total Joint Exercises Ankle Circles/Pumps: Left;AAROM;20 reps;Supine Quad Sets: AROM;Left;10 reps;Supine    General Comments        Pertinent Vitals/Pain Pain Assessment: 0-10 Pain Score: 8  Pain Location: L knee Pain Descriptors / Indicators: Aching;Sore;Guarding Pain Intervention(s): Premedicated before session;Monitored during session;Limited activity within patient's tolerance    Home Living Family/patient expects to be discharged to:: Private residence Living Arrangements: Alone Available Help at Discharge: Friend(s) Type of Home: House Home Access: Ramped entrance   Home Layout: One level Home Equipment: Environmental consultant - 2 wheels;Crutches;Bedside commode;Wheelchair - manual      Prior Function Level of Independence: Independent with assistive device(s)      Comments: Pt largely in wc since previous spacer placement several months ago   PT Goals (current goals can now be found in the care plan section) Acute Rehab PT Goals Patient Stated Goal: Regain IND PT Goal Formulation:  With patient Time For Goal Achievement: 09/19/19 Potential to Achieve Goals: Fair Progress towards PT goals: Progressing toward goals    Frequency    Min 5X/week      PT Plan Current plan remains appropriate    Co-evaluation              AM-PAC PT "6 Clicks" Mobility   Outcome Measure  Help needed turning from your back to your side while in a flat bed without using bedrails?: A  Little Help needed moving from lying on your back to sitting on the side of a flat bed without using bedrails?: A Little Help needed moving to and from a bed to a chair (including a wheelchair)?: A Little Help needed standing up from a chair using your arms (e.g., wheelchair or bedside chair)?: A Lot Help needed to walk in hospital room?: A Little Help needed climbing 3-5 steps with a railing? : A Lot 6 Click Score: 16    End of Session Equipment Utilized During Treatment: Left knee immobilizer;Gait belt Activity Tolerance: Patient tolerated treatment well Patient left: in bed;with call bell/phone within reach Nurse Communication: Mobility status;Precautions;Weight bearing status PT Visit Diagnosis: Other abnormalities of gait and mobility (R26.89);Pain;Muscle weakness (generalized) (M62.81);Difficulty in walking, not elsewhere classified (R26.2) Pain - Right/Left: Left Pain - part of body: Knee     Time: 1325-1355 PT Time Calculation (min) (ACUTE ONLY): 30 min  Charges:  $Gait Training: 8-22 mins $Therapeutic Activity: 8-22 mins                     Debe Coder PT Acute Rehabilitation Services Pager [phone] Office [phone]    Bahja Bence 09/29/2019, 2:32 PM

## 2019-09-30 DIAGNOSIS — B9562 Methicillin resistant Staphylococcus aureus infection as the cause of diseases classified elsewhere: Secondary | ICD-10-CM

## 2019-09-30 DIAGNOSIS — M0000 Staphylococcal arthritis, unspecified joint: Secondary | ICD-10-CM

## 2019-09-30 DIAGNOSIS — T8450XA Infection and inflammatory reaction due to unspecified internal joint prosthesis, initial encounter: Secondary | ICD-10-CM

## 2019-09-30 LAB — CBC
HCT: 29.1 % — ABNORMAL LOW (ref 39.0–52.0)
Hemoglobin: 9 g/dL — ABNORMAL LOW (ref 13.0–17.0)
MCH: 26.2 pg (ref 26.0–34.0)
MCHC: 30.9 g/dL (ref 30.0–36.0)
MCV: 84.8 fL (ref 80.0–100.0)
Platelets: 297 10*3/uL (ref 150–400)
RBC: 3.43 MIL/uL — ABNORMAL LOW (ref 4.22–5.81)
RDW: 14.5 % (ref 11.5–15.5)
WBC: 9.6 10*3/uL (ref 4.0–10.5)
nRBC: 0 % (ref 0.0–0.2)

## 2019-09-30 LAB — CREATININE, SERUM
Creatinine, Ser: 0.93 mg/dL (ref 0.61–1.24)
GFR calc Af Amer: >60 mL/min (ref >60)
GFR calc non Af Amer: >60 mL/min (ref >60)

## 2019-09-30 LAB — CK: Total CK: 16 U/L — ABNORMAL LOW (ref 49–397)

## 2019-09-30 MED ORDER — HEPARIN SOD (PORK) LOCK FLUSH 100 UNIT/ML IV SOLN
250.0000 [IU] | INTRAVENOUS | Status: AC | PRN
  Administered 2019-09-30: 250 [IU]
  Filled 2019-09-30: qty 2.5

## 2019-09-30 MED ORDER — FERROUS SULFATE 325 (65 FE) MG PO TABS: 325.0000 mg | ORAL_TABLET | Freq: Three times a day (TID) | ORAL | 0 refills | Status: AC

## 2019-09-30 MED ORDER — POLYETHYLENE GLYCOL 3350 17 G PO PACK: 17.0000 g | PACK | Freq: Two times a day (BID) | ORAL | 0 refills | Status: AC

## 2019-09-30 MED ORDER — DAPTOMYCIN IV (FOR PTA / DISCHARGE USE ONLY): 800.0000 mg | INTRAVENOUS | 0 refills | Status: AC

## 2019-09-30 MED ORDER — ACETAMINOPHEN 500 MG PO TABS: 1000.0000 mg | ORAL_TABLET | Freq: Three times a day (TID) | ORAL | 0 refills | Status: AC

## 2019-09-30 MED ORDER — DOCUSATE SODIUM 100 MG PO CAPS: 100.0000 mg | ORAL_CAPSULE | Freq: Two times a day (BID) | ORAL | 0 refills | Status: AC

## 2019-09-30 MED ORDER — METHOCARBAMOL 500 MG PO TABS: 500.0000 mg | ORAL_TABLET | Freq: Four times a day (QID) | ORAL | 0 refills | Status: AC | PRN

## 2019-09-30 MED ORDER — OXYCODONE HCL 10 MG PO TABS: 10.0000 mg | ORAL_TABLET | ORAL | 0 refills | Status: AC | PRN

## 2019-09-30 NOTE — TOC Transition Note (Addendum)
Transition of Care [facility]) - CM/SW Discharge Note   Patient Details  Name: TERRIANCE PAVESE MRN: [id number] Date of Birth: 25-Oct-1960  Transition of Care [facility]) CM/SW Contact:  Lia Hopping, [place] Phone Number: 09/30/2019, 2:29 PM   Clinical Narrative:    Navigator Ivar Drape, provided care details, information shared with the patient, written on AVS.   RN, IV infusion [place] will contact patient in the am. Provide dose and teaching to the patient. [place] Fax: [phone]  Therapist [facility]-Will start therapy on Monday.  Phone: [phone] Fax: [phone]  Pharmacy  [facility] -Will deliver medication to the patient home tomorrow.  Phone: [phone] Fax: [phone]   Patient will receive dose today before he discharges home.  CSW provided details to the patient.   Patient arranged for [place] transport at 3:45pm, patient could not leave at the time arranged due to needing his antibiotic before d/c.  CSW reached out to the [employer], arranged wheelchair transport for 5:00 transport.  Patient agreeable to plan, signed waiver emailed.    Final next level of care: Home w Home Health Services Barriers to Discharge: Barriers Resolved   Patient Goals and CMS Choice   CMS Medicare.gov Compare Post Acute Care list provided to:: Patient Choice offered to / list presented to : NA  Discharge Placement                       Discharge Plan and Services In-house Referral: Clinical Social Work Discharge Planning Services: CM Consult Post Acute Care Choice: Home Health                    HH Arranged: IV Antibiotics, PT, RN [facility] Agency: Other - See comment([facility]/Workers Comp.) Date HH Agency Contacted: 09/30/19 Time [place]: 1428 Representative spoke with at [place]: [place] ([place]) Interventions     Readmission Risk  Interventions No flowsheet data found.

## 2019-09-30 NOTE — Plan of Care (Signed)
Patient discharged home in stable condition 

## 2019-09-30 NOTE — TOC Progression Note (Signed)
Transition of Care [facility]) - Progression Note    Patient Details  Name: Vernon Frye MRN: [id number] Date of Birth: 02/05/1961  Transition of Care [facility]) CM/SW [place], LCSW Phone Number: 09/30/2019, 10:14 AM  Clinical Narrative:    CSW faxed updated orders and OPAT to Ivar Drape. [facility] and IV infusion team is being arranged. He will follow up today with details.    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

## 2019-09-30 NOTE — Progress Notes (Signed)
Physical Therapy Treatment Patient Details Name: Vernon Frye MRN: [id number] DOB: [birth year] Today's Date: 09/30/2019    History of Present Illness Pt s/p second antibiotic spacer implantation L knee.  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    PT Comments    Pt continues to improve each session with activity tolerance, ambulatory stability and basic safety awareness.  Pt eager for dc home.   Follow Up Recommendations  Follow surgeon's recommendation for DC plan and follow-up therapies;Home health PT     Equipment Recommendations  None recommended by PT    Recommendations for Other Services       Precautions / Restrictions Precautions Precautions: Fall Required Braces or Orthoses: Knee Immobilizer - Left;Other Brace Knee Immobilizer - Left: On at all times Other Brace: Off-loading boot 2* posterior heel wound in place Restrictions Weight Bearing Restrictions: Yes RUE Weight Bearing: Weight bearing as tolerated LUE Weight Bearing: Weight bearing as tolerated LLE Weight Bearing: Partial weight bearing LLE Partial Weight Bearing Percentage or Pounds: 50% Other Position/Activity Restrictions: ROM at R knee as tolerated per [employer] PA    Mobility  Bed Mobility Overal bed mobility: Needs Assistance Bed Mobility: Supine to Sit     Supine to sit: Min guard     General bed mobility comments: increased time with pt managing L LE with assist of gait belt  Transfers Overall transfer level: Needs assistance Equipment used: Rolling walker (2 wheeled) Transfers: Sit to/from Stand Sit to Stand: Supervision         General transfer comment: cues for LE management and use of UEs to self assist  Ambulation/Gait Ambulation/Gait assistance: Min guard;Supervision Gait Distance (Feet): 20 Feet Assistive device: Rolling walker (2 wheeled) Gait Pattern/deviations: Step-to pattern Gait velocity: decr   General Gait Details: cues for sequence, posture, position from RW  and increased heel contact on L   Theme park manager mobility: Yes Wheelchair propulsion: Both upper extremities Distance: 50 Wheelchair Assistance Details (indicate cue type and reason): min assist to bring L LE up on leg rest  Modified Rankin (Stroke Patients Only)       Balance Overall balance assessment: Needs assistance Sitting-balance support: Feet supported Sitting balance-Leahy Scale: Good     Standing balance support: Bilateral upper extremity supported;During functional activity Standing balance-Leahy Scale: Fair                              Cognition Arousal/Alertness: Awake/alert Behavior During Therapy: WFL for tasks assessed/performed Overall Cognitive Status: Within Functional Limits for tasks assessed                                        Exercises      General Comments        Pertinent Vitals/Pain Pain Assessment: 0-10 Pain Score: 5  Pain Location: L knee Pain Descriptors / Indicators: Aching;Sore;Guarding Pain Intervention(s): Limited activity within patient's tolerance;Monitored during session;Premedicated before session    Home Living                      Prior Function            PT Goals (current goals can now be found in the care plan section) Acute Rehab PT Goals  Patient Stated Goal: Regain IND PT Goal Formulation: With patient Time For Goal Achievement: 09/19/19 Potential to Achieve Goals: Fair Progress towards PT goals: Progressing toward goals    Frequency    Min 5X/week      PT Plan Current plan remains appropriate    Co-evaluation              AM-PAC PT "6 Clicks" Mobility   Outcome Measure  Help needed turning from your back to your side while in a flat bed without using bedrails?: A Little Help needed moving from lying on your back to sitting on the side of a flat bed without using bedrails?: A Little Help  needed moving to and from a bed to a chair (including a wheelchair)?: A Little Help needed standing up from a chair using your arms (e.g., wheelchair or bedside chair)?: A Little Help needed to walk in hospital room?: A Little Help needed climbing 3-5 steps with a railing? : A Lot 6 Click Score: 17    End of Session Equipment Utilized During Treatment: Left knee immobilizer;Gait belt Activity Tolerance: Patient tolerated treatment well Patient left: Other (comment)(BATHROOM) Nurse Communication: Mobility status;Precautions;Weight bearing status PT Visit Diagnosis: Other abnormalities of gait and mobility (R26.89);Pain;Muscle weakness (generalized) (M62.81);Difficulty in walking, not elsewhere classified (R26.2) Pain - Right/Left: Left Pain - part of body: Knee     Time: 1350-1405 PT Time Calculation (min) (ACUTE ONLY): 15 min  Charges:  $Gait Training: 8-22 mins $Therapeutic Activity: 8-22 mins                     Debe Coder PT Acute Rehabilitation Services Pager [phone] Office [phone]    Lasaro Primm 09/30/2019, 2:27 PM

## 2019-09-30 NOTE — Progress Notes (Signed)
[place] for Infectious Disease   Reason for visit: Follow up on PJI  Interval History: no associated rash or diarrhea; no complaints.   Physical Exam: Constitutional:  Vitals:   09/29/19 2218 09/30/19 0613  BP: (!) 149/87 (!) 153/92  Pulse: 70 76  Resp: 19 18  Temp: 98.6 F (37 C) 98 F (36.7 C)  SpO2: 99% 100%   patient appears in NAD Respiratory: Normal respiratory effort; CTA B Cardiovascular: RRR GI: soft, nt, nd  Review of Systems: Constitutional: negative for fevers, chills and malaise Gastrointestinal: negative for nausea and diarrhea  Lab Results  Component Value Date   WBC 9.6 09/30/2019   HGB 9.0 (L) 09/30/2019   HCT 29.1 (L) 09/30/2019   MCV 84.8 09/30/2019   PLT 297 09/30/2019    Lab Results  Component Value Date   CREATININE 0.93 09/30/2019   BUN 18 09/29/2019   NA 134 (L) 09/29/2019   K 4.3 09/29/2019   CL 99 09/29/2019   CO2 24 09/29/2019    Lab Results  Component Value Date   ALT 19 09/27/2019   AST 18 09/27/2019   ALKPHOS 74 09/27/2019     Microbiology: Recent Results (from the past 240 hour(s))  SARS CORONAVIRUS 2 (TAT 6-24 HRS) Nasopharyngeal Nasopharyngeal Swab     Status: None   Collection Time: 09/23/19  2:06 PM   Specimen: Nasopharyngeal Swab  Result Value Ref Range Status   SARS Coronavirus 2 NEGATIVE NEGATIVE Final    Comment: (NOTE) SARS-CoV-2 target nucleic acids are NOT DETECTED. The SARS-CoV-2 RNA is generally detectable in upper and lower respiratory specimens during the acute phase of infection. Negative results do not preclude SARS-CoV-2 infection, do not rule out co-infections with other pathogens, and should not be used as the sole basis for treatment or other patient management decisions. Negative results must be combined with clinical observations, patient history, and epidemiological information. The expected result is Negative. Fact Sheet for Patients: SugarRoll.be Fact  Sheet for Healthcare Providers: https://www.woods-mathews.com/ This test is not yet approved or cleared by the [place] FDA and  has been authorized for detection and/or diagnosis of SARS-CoV-2 by FDA under an Emergency Use Authorization (EUA). This EUA will remain  in effect (meaning this test can be used) for the duration of the COVID-19 declaration under Section 56 4(b)(1) of the Act, 21 U.S.C. section 360bbb-3(b)(1), unless the authorization is terminated or revoked sooner. Performed at [facility], [place] [address] [place], [place] [address]   Aerobic/Anaerobic Culture (surgical/deep wound)     Status: None (Preliminary result)   Collection Time: 09/27/19  2:39 PM   Specimen: PATH Cytology Misc. fluid; Body Fluid  Result Value Ref Range Status   Specimen Description   Final    SYNOVIAL LEFT KNEE Performed at [place] [facility] [address] [place], [place] [address]    Special Requests   Final    NONE Performed at [facility], [place] [address], [place], [place] [address]    Gram Stain   Final    MODERATE WBC PRESENT, PREDOMINANTLY MONONUCLEAR NO ORGANISMS SEEN    Culture   Final    NO GROWTH 2 DAYS Performed at [facility] [address] [place], [place] [address]    Report Status PENDING  Incomplete    Impression/Plan:  1. 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  On daptomycin and tolerating.  Plan for 6 weeks of daptomycin through  April 13. Labs per protocol  2. Medication monitoring - CK baseline 16.  Will continue to monitor weekly  3.  Disposition - opat order placed and I will arrange follow up with me in about 2 weeks.
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
## 2019-09-30 NOTE — Progress Notes (Deleted)
CARDIOLOGY OFFICE NOTE  Date:  09/30/2019    Vernon Frye Date of Birth: 09/08/1960 Medical Record [id number]  PCP:  Nicholes Rough, PA-C  Cardiologist:  Tamala Julian (NEW)    No chief complaint on file.   History of Present Illness: Vernon Frye is a 59 y.o. male who presents today for a post hospital visit. Seen for Dr. Tamala Julian (NEW).  He has a history of chronic pain, anxiety, hypertension, and a left knee replacement in 2016 who presented with swelling and pain to the left knee on 07/12/2019.  Found to have an infected joint - ended up having surgery. Found to be in AF with RVR as well - required amiodarone. Hospitalization was complicated by encephalopathy. He did undergo cardioversion in early January. Not able to go to SNF due to not having insurance - received IV antibiotics thru the first part of February and then underwent revision earlier this month.   The patient {does/does not:200015} have symptoms concerning for COVID-19 infection (fever, chills, cough, or new shortness of breath).   Comes in today. Here with   Past Medical History:  Diagnosis Date  . Anxiety    takes Xanax daily  . Arthritis   . Chronic back pain    DDD  . Complication of anesthesia    agitated when awaking   . H/O rotator cuff tear   . History of kidney stones   . Hypertension    takes Toprol,Prinizide,and Amlodipine daily  . Joint pain   . Joint swelling   . Laceration    history of to right lower extermity   . Night muscle spasms    takes Soma daily  . Numbness and tingling    hands and feet   . Pneumonia    history of   . Sepsis ([place]) 09/07/2019  . Tinnitus     Past Surgical History:  Procedure Laterality Date  . CARDIOVERSION N/A 08/02/2019   Procedure: CARDIOVERSION;  Surgeon: Pixie Casino, MD;  Location: [facility] ENDOSCOPY;  Service: Cardiovascular;  Laterality: N/A;  . EXCISIONAL TOTAL KNEE ARTHROPLASTY WITH ANTIBIOTIC SPACERS Left 09/27/2019   Procedure: Left knee repeat  irrigation and debridement with spacer;  Surgeon: Paralee Cancel, MD;  Location: [facility] ORS;  Service: Orthopedics;  Laterality: Left;  2 hrs  . HERNIA REPAIR Bilateral as a baby   inguinal-  . I & D KNEE WITH POLY EXCHANGE Left 07/13/2019   Procedure: IRRIGATION AND DEBRIDEMENT TOTAL  KNEE WITH POLY EXCHANGE;  Surgeon: Paralee Cancel, MD;  Location: [facility];  Service: Orthopedics;  Laterality: Left;  . LITHOTRIPSY    . SHOULDER ARTHROSCOPY WITH SUBACROMIAL DECOMPRESSION AND OPEN ROTATOR C Left 01/16/2014   Procedure: LEFT SHOULDER ARTHROSCOPY WITH SUBACROMIAL DECOMPRESSION AND MINI OPEN ROTATOR CUFF REPAIR, OPEN DISTAL CLAVICLE RESECTION AND TENDODESIS;  Surgeon: Augustin Schooling, MD;  Location: [facility];  Service: Orthopedics;  Laterality: Left;  . TEE WITHOUT CARDIOVERSION N/A 08/02/2019   Procedure: TRANSESOPHAGEAL ECHOCARDIOGRAM (TEE);  Surgeon: Pixie Casino, MD;  Location: [facility] ENDOSCOPY;  Service: Cardiovascular;  Laterality: N/A;  . tens unit     for severe pain   . TOTAL KNEE ARTHROPLASTY Left 06/19/2015   Procedure: LEFT TOTAL KNEE ARTHROPLASTY;  Surgeon: Paralee Cancel, MD;  Location: [facility] ORS;  Service: Orthopedics;  Laterality: Left;  . TOTAL KNEE REVISION Left 07/26/2019   Procedure: TOTAL KNEE REVISION removal of left total knee replacement with placement of antibiotic spacers;  Surgeon: Paralee Cancel, MD;  Location:  [facility] OR;  Service: Orthopedics;  Laterality: Left;     Medications: No outpatient medications have been marked as taking for the 10/04/19 encounter (Appointment) with Burtis Junes, NP.     Allergies: Allergies  Allergen Reactions  . Vancomycin Shortness Of Breath and Rash    [name] . Tolerated at lower rate during hospitalization 12/20.    Social History: The patient  reports that he quit smoking about 15 years ago. His smoking use included cigarettes. He has a 1.75 pack-year smoking history. He has never used smokeless tobacco. He reports current alcohol use. He  reports that he does not use drugs.   Family History: The patient's ***family history includes Heart disease in an other family member.   Review of Systems: Please see the history of present illness.   All other systems are reviewed and negative.   Physical Exam: VS:  There were no vitals taken for this visit. [name] Kitchen  BMI There is no height or weight on file to calculate BMI.  Wt Readings from Last 3 Encounters:  09/27/19 259 lb 14.8 oz (117.9 kg)  09/06/19 245 lb 13 oz (111.5 kg)  09/16/18 260 lb (117.9 kg)    General: Pleasant. Well developed, well nourished and in no acute distress.   HEENT: Normal.  Neck: Supple, no JVD, carotid bruits, or masses noted.  Cardiac: ***Regular rate and rhythm. No murmurs, rubs, or gallops. No edema.  Respiratory:  Lungs are clear to auscultation bilaterally with normal work of breathing.  GI: Soft and nontender.  MS: No deformity or atrophy. Gait and ROM intact.  Skin: Warm and dry. Color is normal.  Neuro:  Strength and sensation are intact and no gross focal deficits noted.  Psych: Alert, appropriate and with normal affect.   LABORATORY DATA:  EKG:  EKG {ACTION; IS/IS GI:[id number] ordered today. This demonstrates ***.  Lab Results  Component Value Date   WBC 9.6 09/30/2019   HGB 9.0 (L) 09/30/2019   HCT 29.1 (L) 09/30/2019   PLT 297 09/30/2019   GLUCOSE 177 (H) 09/29/2019   TRIG 415 (H) 07/30/2019   ALT 19 09/27/2019   AST 18 09/27/2019   NA 134 (L) 09/29/2019   K 4.3 09/29/2019   CL 99 09/29/2019   CREATININE 0.93 09/30/2019   BUN 18 09/29/2019   CO2 24 09/29/2019   TSH 1.796 07/13/2019   INR 1.1 09/27/2019   HGBA1C 5.2 07/27/2019     BNP (last 3 results) Recent Labs    07/13/19 0431  BNP 299.7*    ProBNP (last 3 results) No results for input(s): PROBNP in the last 8760 hours.   Other Studies Reviewed Today:   ECHO IMPRESSIONS 06/2019  1. Left ventricular ejection fraction, by visual estimation, is 55 to  60%.  The left ventricle has normal function. There is mildly increased  left ventricular hypertrophy.  2. Left ventricular diastolic function could not be evaluated.  3. Right ventricular volume/pressure overload.  4. The left ventricle demonstrates regional wall motion abnormalities.  5. Global right ventricle has severely reduced systolic function.The  right ventricular size is severely enlarged. No increase in right  ventricular wall thickness.  6. Left atrial size was normal.  7. Right atrial size was mild-moderately dilated.  8. Presence of pericardial fat pad.  9. Trivial pericardial effusion is present.  10. Mild mitral annular calcification.  11. The mitral valve is degenerative. Trivial mitral valve regurgitation.  12. The tricuspid valve is grossly normal. Tricuspid valve regurgitation  is trivial.  13. The aortic valve is tricuspid. Aortic valve regurgitation is not  visualized. No evidence of aortic valve sclerosis or stenosis.  14. The pulmonic valve was grossly normal. Pulmonic valve regurgitation is  not visualized.  15. TR signal is inadequate for assessing pulmonary artery systolic  pressure.  16. The inferior vena cava is normal in size with greater than 50%  respiratory variability, suggesting right atrial pressure of 3 mmHg.  17. No prior Echocardiogram.   Assessment & Plan    1 atrial fibrillation with rapid ventricular response-patient remains in sinus rhythm today.  Continue amiodarone and metoprolol at present dose.  Continue apixaban 5 mg twice daily.  2 MRSA bacteremia-felt secondary to infected left knee prosthesis.  TEE shows no vegetations.  3 infected prosthetic left knee-continue vancomycin.  4 hypertension-blood pressure controlled; continue present meds and follow.  5 volume excess-patient appears to be euvolemic.  Will discontinue Lasix.  [facility] will sign off.   Medication Recommendations:  Continue present cardiac meds Other  recommendations (labs, testing, etc):  No additional cardiac testing Follow up as an outpatient:  4-6 weeks following DC  With APP or Dr Tamala Julian  . COVID-19 Education: The signs and symptoms of COVID-19 were discussed with the patient and how to seek care for testing (follow up with PCP or arrange E-visit).  The importance of social distancing, staying at home, hand hygiene and wearing a mask when out in public were discussed today.  Current medicines are reviewed with the patient today.  The patient does not have concerns regarding medicines other than what has been noted above.  The following changes have been made:  See above.  Labs/ tests ordered today include:   No orders of the defined types were placed in this encounter.    Disposition:   FU with *** in {gen number [id number] {Days to years:10300}.   Patient is agreeable to this plan and will call if any problems develop in the interim.   SignedTruitt Merle, NP  09/30/2019 7:53 PM  [place] [address] [place], [place]  [address] Phone: [phone] Fax: [phone]

## 2019-09-30 NOTE — Progress Notes (Signed)
Physical Therapy Treatment Patient Details Name: Vernon Frye MRN: [id number] DOB: [birth year] Today's Date: 09/30/2019    History of Present Illness Pt s/p second antibiotic spacer implantation L knee.  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    PT Comments    Pt continues to progress with noted improvement in activity tolerance and ambulatory stability noted.  Pt transferring to/from wc and EOB with sup and cues for LE management and use of UEs.   Follow Up Recommendations  Follow surgeon's recommendation for DC plan and follow-up therapies;Home health PT     Equipment Recommendations  None recommended by PT    Recommendations for Other Services       Precautions / Restrictions Precautions Precautions: Fall Required Braces or Orthoses: Knee Immobilizer - Left;Other Brace Knee Immobilizer - Left: On at all times Other Brace: Off-loading boot 2* posterior heel wound in place Restrictions Weight Bearing Restrictions: Yes RUE Weight Bearing: Weight bearing as tolerated LUE Weight Bearing: Weight bearing as tolerated LLE Weight Bearing: Partial weight bearing LLE Partial Weight Bearing Percentage or Pounds: 50% Other Position/Activity Restrictions: ROM at R knee as tolerated per [employer] PA    Mobility  Bed Mobility Overal bed mobility: Needs Assistance Bed Mobility: Supine to Sit     Supine to sit: Min guard     General bed mobility comments: increased time with pt managing L LE with assist of gait belt  Transfers Overall transfer level: Needs assistance Equipment used: Rolling walker (2 wheeled) Transfers: Sit to/from Stand Sit to Stand: Min guard         General transfer comment: cues for LE management and use of UEs to self assist  Ambulation/Gait Ambulation/Gait assistance: Min guard Gait Distance (Feet): 55 Feet Assistive device: Rolling walker (2 wheeled) Gait Pattern/deviations: Step-to pattern Gait velocity: decr   General Gait Details:  cues for sequence, posture, position from RW and increased heel contact on L   Theme park manager mobility: Yes Wheelchair propulsion: Both upper extremities Distance: 50 Wheelchair Assistance Details (indicate cue type and reason): min assist to bring L LE up on leg rest  Modified Rankin (Stroke Patients Only)       Balance Overall balance assessment: Needs assistance Sitting-balance support: Feet supported Sitting balance-Leahy Scale: Good     Standing balance support: Bilateral upper extremity supported;During functional activity Standing balance-Leahy Scale: Fair                              Cognition Arousal/Alertness: Awake/alert Behavior During Therapy: WFL for tasks assessed/performed Overall Cognitive Status: Within Functional Limits for tasks assessed                                        Exercises      General Comments        Pertinent Vitals/Pain Pain Assessment: 0-10 Pain Score: 6  Pain Location: L knee Pain Descriptors / Indicators: Aching;Sore;Guarding Pain Intervention(s): Limited activity within patient's tolerance;Monitored during session;Premedicated before session    Home Living                      Prior Function            PT Goals (current goals can  now be found in the care plan section) Acute Rehab PT Goals Patient Stated Goal: Regain IND PT Goal Formulation: With patient Time For Goal Achievement: 09/19/19 Potential to Achieve Goals: Fair Progress towards PT goals: Progressing toward goals    Frequency    Min 5X/week      PT Plan Current plan remains appropriate    Co-evaluation              AM-PAC PT "6 Clicks" Mobility   Outcome Measure  Help needed turning from your back to your side while in a flat bed without using bedrails?: A Little Help needed moving from lying on your back to sitting on the side of a flat  bed without using bedrails?: A Little Help needed moving to and from a bed to a chair (including a wheelchair)?: A Little Help needed standing up from a chair using your arms (e.g., wheelchair or bedside chair)?: A Lot Help needed to walk in hospital room?: A Little Help needed climbing 3-5 steps with a railing? : A Lot 6 Click Score: 16    End of Session Equipment Utilized During Treatment: Left knee immobilizer;Gait belt Activity Tolerance: Patient tolerated treatment well Patient left: with SCD's reapplied(sitting EOB) Nurse Communication: Mobility status;Precautions;Weight bearing status PT Visit Diagnosis: Other abnormalities of gait and mobility (R26.89);Pain;Muscle weakness (generalized) (M62.81);Difficulty in walking, not elsewhere classified (R26.2) Pain - Right/Left: Left Pain - part of body: Knee     Time: 1208-1237 PT Time Calculation (min) (ACUTE ONLY): 29 min  Charges:  $Gait Training: 8-22 mins $Therapeutic Activity: 8-22 mins                     Kendall West Pager [phone] Office [phone]    Kenesha Moshier 09/30/2019, 2:21 PM

## 2019-10-02 LAB — AEROBIC/ANAEROBIC CULTURE W GRAM STAIN (SURGICAL/DEEP WOUND): Culture: NO GROWTH

## 2019-10-04 ENCOUNTER — Ambulatory Visit: Payer: Self-pay | Admitting: Nurse Practitioner
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
## 2019-10-05 ENCOUNTER — Telehealth: Payer: Self-pay

## 2019-10-05 NOTE — Telephone Encounter (Signed)
Faxed lab orders to patient's IV therapy pharmacy at request of Carla, RN. Weekly CBC, BMP, CRP, ESR and CPK. PICC care per protocol.   R , RN  

## 2019-10-18 ENCOUNTER — Encounter (HOSPITAL_BASED_OUTPATIENT_CLINIC_OR_DEPARTMENT_OTHER): Payer: Self-pay | Attending: Internal Medicine | Admitting: Internal Medicine

## 2019-10-18 ENCOUNTER — Inpatient Hospital Stay: Payer: Self-pay | Admitting: Internal Medicine

## 2019-10-18 ENCOUNTER — Other Ambulatory Visit: Payer: Self-pay

## 2019-10-18 DIAGNOSIS — L97521 Non-pressure chronic ulcer of other part of left foot limited to breakdown of skin: Secondary | ICD-10-CM | POA: Insufficient documentation

## 2019-10-18 DIAGNOSIS — Z87442 Personal history of urinary calculi: Secondary | ICD-10-CM | POA: Insufficient documentation

## 2019-10-18 DIAGNOSIS — Z96652 Presence of left artificial knee joint: Secondary | ICD-10-CM | POA: Insufficient documentation

## 2019-10-18 DIAGNOSIS — L89620 Pressure ulcer of left heel, unstageable: Secondary | ICD-10-CM | POA: Insufficient documentation

## 2019-10-18 DIAGNOSIS — Z87891 Personal history of nicotine dependence: Secondary | ICD-10-CM | POA: Insufficient documentation

## 2019-10-18 DIAGNOSIS — I10 Essential (primary) hypertension: Secondary | ICD-10-CM | POA: Insufficient documentation

## 2019-10-18 DIAGNOSIS — Z8349 Family history of other endocrine, nutritional and metabolic diseases: Secondary | ICD-10-CM | POA: Insufficient documentation

## 2019-10-18 DIAGNOSIS — G4730 Sleep apnea, unspecified: Secondary | ICD-10-CM | POA: Insufficient documentation

## 2019-10-18 DIAGNOSIS — Z823 Family history of stroke: Secondary | ICD-10-CM | POA: Insufficient documentation

## 2019-10-18 DIAGNOSIS — I70245 Atherosclerosis of native arteries of left leg with ulceration of other part of foot: Secondary | ICD-10-CM | POA: Insufficient documentation

## 2019-10-18 DIAGNOSIS — Z8249 Family history of ischemic heart disease and other diseases of the circulatory system: Secondary | ICD-10-CM | POA: Insufficient documentation

## 2019-10-18 DIAGNOSIS — I4891 Unspecified atrial fibrillation: Secondary | ICD-10-CM | POA: Insufficient documentation

## 2019-10-18 NOTE — Progress Notes (Signed)
Vernon, Frye ([id number]) Visit Report for 10/18/2019 Allergy List Details Patient Name: Date of Service: OTHO, Vernon Frye 10/18/2019 1:15 PM Medical Record [id number] Patient Account Number: [id number] Date of Birth/Sex: Treating RN: April 04, 1961 (59 y.o. Ernestene Mention Primary Care Manya Balash: Nicholes Rough Other Clinician: Referring Makaleigh Reinard: Treating Demarious Kapur/Extender:Robson, Lorn Junes, Janene Harvey in Treatment: 0 Allergies Active Allergies vancomycin HCl Reaction: SOB, rash Severity: Moderate Allergy Notes Electronic Signature(s) Signed: 10/18/2019 5:59:14 PM By: Baruch Gouty RN, BSN Entered By: Baruch Gouty on 10/18/2019 14:30:27 -------------------------------------------------------------------------------- Arrival Information Details Patient Name: Date of Service: Vernon Frye 10/18/2019 1:15 PM Medical Record [id number] Patient Account Number: [id number] Date of Birth/Sex: Treating RN: Sep 18, 1960 (59 y.o. Ernestene Mention Primary Care Jamariah Tony: Nicholes Rough Other Clinician: Referring Fatou Dunnigan: Treating Quron Ruddy/Extender:Robson, Lorn Junes, Janene Harvey in Treatment: 0 Visit Information Patient Arrived: Wheel Chair Arrival Time: 14:05 Accompanied By: self Transfer Assistance: None Patient Identification Verified: Yes Secondary Verification Process Completed: Yes Patient Requires Transmission-Based No Precautions: Patient Has Alerts: No Electronic Signature(s) Signed: 10/18/2019 5:59:14 PM By: Baruch Gouty RN, BSN Entered By: Baruch Gouty on 10/18/2019 14:14:01 -------------------------------------------------------------------------------- Clinic Level of Care Assessment Details Patient Name: Date of Service: Vernon, Frye 10/18/2019 1:15 PM Medical Record [id number] Patient Account Number: [id number] Date of Birth/Sex: Treating RN: [birth year] (58 y.o. [name] Primary Care Aften Lipsey: Nicholes Rough Other  Clinician: Referring Krishika Bugge: Treating Aveya Beal/Extender:Robson, Lorn Junes, Janene Harvey in Treatment: 0 Clinic Level of Care Assessment Items TOOL 2 Quantity Score X - Use when only an EandM is performed on the INITIAL visit 1 0 ASSESSMENTS - Nursing Assessment / Reassessment X - General Physical Exam (combine w/ comprehensive assessment (listed just below) 1 20 when performed on new pt. evals) X - Comprehensive Assessment (HX, ROS, Risk Assessments, Wounds Hx, etc.) 1 25 ASSESSMENTS - Wound and Skin Assessment / Reassessment []  - Simple Wound Assessment / Reassessment - one wound 0 X - Complex Wound Assessment / Reassessment - multiple wounds 2 5 []  - Dermatologic / Skin Assessment (not related to wound area) 0 ASSESSMENTS - Ostomy and/or Continence Assessment and Care []  - Incontinence Assessment and Management 0 []  - Ostomy Care Assessment and Management (repouching, etc.) 0 PROCESS - Coordination of Care X - Simple Patient / Family Education for ongoing care 1 15 []  - Complex (extensive) Patient / Family Education for ongoing care 0 X - Staff obtains Consents, Records, Test Results / Process Orders 1 10 []  - Staff telephones HHA, Nursing Homes / Clarify orders / etc 0 []  - Routine Transfer to another Facility (non-emergent condition) 0 []  - Routine Hospital Admission (non-emergent condition) 0 X - New Admissions / Biomedical engineer / Ordering NPWT, Apligraf, etc. 1 15 []  - Emergency Hospital Admission (emergent condition) 0 X - Simple Discharge Coordination 1 10 []  - Complex (extensive) Discharge Coordination 0 PROCESS - Special Needs []  - Pediatric / Minor Patient Management 0 []  - Isolation Patient Management 0 []  - Hearing / Language / Visual special needs 0 []  - Assessment of Community assistance (transportation, D/C planning, etc.) 0 []  - Additional assistance / Altered mentation 0 []  - Support Surface(s) Assessment (bed, cushion, seat, etc.) 0 INTERVENTIONS -  Wound Cleansing / Measurement X - Wound Imaging (photographs - any number of wounds) 1 5 []  - Wound Tracing (instead of photographs) 0 []  - Simple Wound Measurement - one wound 0 X - Complex Wound Measurement - multiple wounds 2 5 []  - Simple Wound Cleansing - one wound 0 []  -  Complex Wound Cleansing - multiple wounds 0 INTERVENTIONS - Wound Dressings []  - Small Wound Dressing one or multiple wounds 0 X - Medium Wound Dressing one or multiple wounds 1 15 []  - Large Wound Dressing one or multiple wounds 0 []  - Application of Medications - injection 0 INTERVENTIONS - Miscellaneous []  - External ear exam 0 []  - Specimen Collection (cultures, biopsies, blood, body fluids, etc.) 0 []  - Specimen(s) / Culture(s) sent or taken to Lab for analysis 0 []  - Patient Transfer (multiple staff / Harrel Lemon Lift / Similar devices) 0 []  - Simple Staple / Suture removal (25 or less) 0 []  - Complex Staple / Suture removal (26 or more) 0 []  - Hypo / Hyperglycemic Management (close monitor of Blood Glucose) 0 X - Ankle / Brachial Index (ABI) - do not check if billed separately 1 15 Has the patient been seen at the hospital within the last three years: Yes Total Score: 150 Level Of Care: New/Established - Level 4 Electronic Signature(s) Signed: 10/18/2019 5:59:23 PM By: [name] RN Entered By: [name] on 10/18/2019 16:58:08 -------------------------------------------------------------------------------- Encounter Discharge Information Details Patient Name: Date of Service: Vernon Frye 10/18/2019 1:15 PM Medical Record [id number] Patient Account Number: [id number] Date of Birth/Sex: Treating RN: 1961-04-15 (58 y.o. Marvis Repress Primary Care Benicio Manna: Nicholes Rough Other Clinician: Referring Pamela Intrieri: Treating Kathyleen Radice/Extender:Robson, Lorn Junes, Janene Harvey in Treatment: 0 Encounter Discharge Information Items Discharge Condition: Stable Ambulatory Status:  Wheelchair Discharge Destination: Home Transportation: Private Auto Accompanied By: friend Schedule Follow-up Appointment: Yes Clinical Summary of Care: Patient Declined Electronic Signature(s) Signed: 10/18/2019 5:48:19 PM By: Kela Millin Entered By: Kela Millin on 10/18/2019 17:43:23 -------------------------------------------------------------------------------- Lower Extremity Assessment Details Patient Name: Date of Service: HELIX, PATTERSON 10/18/2019 1:15 PM Medical Record [id number] Patient Account Number: [id number] Date of Birth/Sex: Treating RN: May 04, 1961 (59 y.o. Ernestene Mention Primary Care Daphne Karrer: Nicholes Rough Other Clinician: Referring Yajaira Doffing: Treating Hortence Charter/Extender:Robson, Lorn Junes, Janene Harvey in Treatment: 0 Edema Assessment Assessed: [Left: No] [Right: No] Edema: [Left: Ye] [Right: s] Calf Left: Right: Point of Measurement: cm From Medial Instep 42.5 cm cm Ankle Left: Right: Point of Measurement: cm From Medial Instep 26.5 cm cm Vascular Assessment Pulses: Dorsalis Pedis Palpable: [Left:No] Doppler Audible: [Left:Yes] Posterior Tibial Palpable: [Left:Yes] Blood Pressure: Brachial: [Left:128] Ankle: [Left:Posterior Tibial: 72 0.56] Electronic Signature(s) Signed: 10/18/2019 5:59:14 PM By: Baruch Gouty RN, BSN Entered By: Baruch Gouty on 10/18/2019 14:58:00 -------------------------------------------------------------------------------- Multi Wound Chart Details Patient Name: Date of Service: Vernon Frye 10/18/2019 1:15 PM Medical Record [id number] Patient Account Number: [id number] Date of Birth/Sex: Treating RN: Apr 22, 1961 (59 y.o. M) Primary Care Meleana Commerford: Nicholes Rough Other Clinician: Referring Amarri Michaelson: Treating Izak Anding/Extender:Robson, Lorn Junes, Janene Harvey in Treatment: 0 Vital Signs Height(in): 75 Pulse(bpm): 70 Weight(lbs): 240 Blood Pressure(mmHg): 128/81 Body Mass Index(BMI):  30 Temperature(F): 98.1 Respiratory 18 Rate(breaths/min): Photos: [1:No Photos] [2:No Photos] [N/A:N/A] Wound Location: [1:Left Calcaneus] [2:Left Toe Fifth] [N/A:N/A] Wounding Event: [1:Pressure Injury] [2:Pressure Injury] [N/A:N/A] Primary Etiology: [1:Pressure Ulcer] [2:Arterial Insufficiency Ulcer] [N/A:N/A] Comorbid History: [1:Sleep Apnea, Arrhythmia, Hypertension] [2:Sleep Apnea, Arrhythmia, Hypertension] [N/A:N/A] Date Acquired: [1:07/17/2019] [2:07/17/2019] [N/A:N/A] Weeks of Treatment: [1:0] [2:0] [N/A:N/A] Wound Status: [1:Open] [2:Open] [N/A:N/A] Measurements L x W x D [1:4.5x4.3x0.1] [2:1.3x1.2x0.1] [N/A:N/A] (cm) Area (cm) : [1:15.197] [2:1.225] [N/A:N/A] Volume (cm) : [1:1.52] [2:0.123] [N/A:N/A] Classification: [1:Unstageable/Unclassified] [2:Full Thickness Without Exposed Support Structures] [N/A:N/A] Exudate Amount: [1:Small] [2:Small] [N/A:N/A] Exudate Type: [1:Serosanguineous] [2:Serosanguineous] [N/A:N/A] Exudate Color: [1:red, brown] [2:red, brown] [N/A:N/A] Wound Margin: [1:Flat and Intact] [2:Flat and Intact] [N/A:N/A]  Granulation Amount: [1:None Present (0%)] [2:Large (67-100%)] [N/A:N/A] Granulation Quality: [1:N/A] [2:Red] [N/A:N/A] Necrotic Amount: [1:Large (67-100%)] [2:None Present (0%)] [N/A:N/A] Necrotic Tissue: [1:Eschar] [2:N/A] [N/A:N/A] Exposed Structures: [1:Fat Layer (Subcutaneous Tissue) Exposed: Yes Fascia: No Tendon: No Muscle: No Joint: No Bone: No Small (1-33%)] [2:Fat Layer (Subcutaneous N/A Tissue) Exposed: Yes Fascia: No Tendon: No Muscle: No Joint: No Bone: No Small (1-33%)] [N/A:N/A] Treatment Notes Electronic Signature(s) Signed: 10/18/2019 5:53:09 PM By: [name] MD Entered By: [name] on 10/18/2019 15:41:50 -------------------------------------------------------------------------------- Multi-Disciplinary Care Plan Details Patient Name: Date of Service: Vernon Frye 10/18/2019 1:15 PM Medical Record  [id number] Patient Account Number: [id number] Date of Birth/Sex: Treating RN: 09-30-1960 (58 y.o. [name] Primary Care Naheim Burgen: Nicholes Rough Other Clinician: Referring Laretta Pyatt: Treating Rayson Rando/Extender:Robson, Lorn Junes, Janene Harvey in Treatment: 0 Active Inactive Wound/Skin Impairment Nursing Diagnoses: Knowledge deficit related to ulceration/compromised skin integrity Goals: Patient/caregiver will verbalize understanding of skin care regimen Date Initiated: 10/18/2019 Target Resolution Date: 11/18/2019 Goal Status: Active Ulcer/skin breakdown will have a volume reduction of 30% by week 4 Date Initiated: 10/18/2019 Target Resolution Date: 11/18/2019 Goal Status: Active Interventions: Assess patient/caregiver ability to obtain necessary supplies Assess patient/caregiver ability to perform ulcer/skin care regimen upon admission and as needed Assess ulceration(s) every visit Notes: Electronic Signature(s) Signed: 10/18/2019 5:59:23 PM By: [name] RN Entered By: [name] on 10/18/2019 16:56:29 -------------------------------------------------------------------------------- Pain Assessment Details Patient Name: Date of Service: SONU, WAMSER 10/18/2019 1:15 PM Medical Record [id number] Patient Account Number: [id number] Date of Birth/Sex: Treating RN: [birth year] (59 y.o. Ernestene Mention Primary Care Shaylon Gillean: Nicholes Rough Other Clinician: Referring Krew Hortman: Treating Marykay Mccleod/Extender:Robson, Lorn Junes, Janene Harvey in Treatment: 0 Active Problems Location of Pain Severity and Description of Pain Patient Has Paino Yes Site Locations Pain Location: Generalized Pain, Pain in Ulcers With Dressing Change: Yes Duration of the Pain. Constant / Intermittento Constant Rate the pain. Current Pain Level: 7 Worst Pain Level: 8 Least Pain Level: 3 Character of Pain Describe the Pain: Aching, Tender Pain Management and Medication Current  Pain Management: Medication: Yes Is the Current Pain Management Adequate: Adequate How does your wound impact your activities of daily livingo Sleep: Yes Bathing: No Appetite: No Relationship With Others: No Bladder Continence: No Emotions: Yes Bowel Continence: No Work: Yes Toileting: No Drive: Yes Dressing: No Hobbies: Astronomer) Signed: 10/18/2019 5:59:14 PM By: Baruch Gouty RN, BSN Entered By: Baruch Gouty on 10/18/2019 15:06:28 -------------------------------------------------------------------------------- Patient/Caregiver Education Details Patient Name: Date of Service: Vernon Frye 3/23/2021andnbsp1:15 PM Medical Record [id number] Patient Account Number: [id number] Date of Birth/Gender: [birth year] (58 y.o. M) Treating RN: [name] Primary Care Physician: Nicholes Rough Other Clinician: Referring Physician: Treating Physician/Extender:Robson, Lorn Junes, Janene Harvey in Treatment: 0 Education Assessment Education Provided To: Patient Education Topics Provided Wound/Skin Impairment: Methods: Explain/Verbal Responses: State content correctly Electronic Signature(s) Signed: 10/18/2019 5:59:23 PM By: [name] RN Entered By: [name] on 10/18/2019 16:56:41 -------------------------------------------------------------------------------- Wound Assessment Details Patient Name: Date of Service: DONTE, GLICKSTEIN 10/18/2019 1:15 PM Medical Record [id number] Patient Account Number: [id number] Date of Birth/Sex: Treating RN: July 19, 1961 (59 y.o. Ernestene Mention Primary Care Cassell Voorhies: Nicholes Rough Other Clinician: Referring Celester Morgan: Treating Truly Stankiewicz/Extender:Robson, Lorn Junes, Janene Harvey in Treatment: 0 Wound Status Wound Number: 1 Primary Etiology: Pressure Ulcer Wound Location: Left Calcaneus Wound Status: Open Wounding Event: Pressure Injury Comorbid Sleep Apnea, Arrhythmia, History: Hypertension Date  Acquired: 07/17/2019 Weeks Of Treatment: 0 Clustered Wound: No Wound Measurements Length: (cm) 4.5 % Width: (cm) 4.3 % Depth: (  cm) 0.1 Ep Area: (cm) 15.197 T Volume: (cm) 1.52 U Wound Description Classification: Unstageable/Unclassified Wound Margin: Flat and Intact Exudate Amount: Small Exudate Type: Serosanguineous Exudate Color: red, brown Wound Bed Granulation Amount: None Present (0%) Necrotic Amount: Large (67-100%) Necrotic Quality: Eschar Foul Odor After Cleansing: No Slough/Fibrino No Exposed Structure Fascia Exposed: No Fat Layer (Subcutaneous Tissue) Exposed: Yes Tendon Exposed: No Muscle Exposed: No Joint Exposed: No Bone Exposed: No Reduction in Area: Reduction in Volume: ithelialization: Small (1-33%) unneling: No ndermining: No Treatment Notes Wound #1 (Left Calcaneus) 1. Cleanse With Wound Cleanser 3. Primary Dressing Applied Calcium Alginate Ag Other primary dressing (specifiy in notes) 4. Secondary Dressing Dry Gauze Roll Gauze Heel Cup 5. Secured With Tape 7. Footwear/Offloading device applied Other footwear/offloading device (specify in notes) Notes silver alginate to toe. paint with betadine to heel, heel cup, kerlix. offloading heel shoe Electronic Signature(s) Signed: 10/18/2019 5:59:14 PM By: Baruch Gouty RN, BSN Entered By: Baruch Gouty on 10/18/2019 15:04:20 -------------------------------------------------------------------------------- Wound Assessment Details Patient Name: Date of Service: RANNON, ECONOMOS 10/18/2019 1:15 PM Medical Record [id number] Patient Account Number: [id number] Date of Birth/Sex: Treating RN: 08-16-1960 (59 y.o. Ernestene Mention Primary Care Keyontay Stolz: Nicholes Rough Other Clinician: Referring Manraj Yeo: Treating Ashiya Kinkead/Extender:Robson, Lorn Junes, Janene Harvey in Treatment: 0 Wound Status Wound Number: 2 Primary Etiology: Arterial Insufficiency Ulcer Wound Location: Left Toe  Fifth Wound Status: Open Wounding Event: Pressure Injury Comorbid Sleep Apnea, Arrhythmia, History: Hypertension Date Acquired: 07/17/2019 Weeks Of Treatment: 0 Clustered Wound: No Wound Measurements Length: (cm) 1.3 % Reduct Width: (cm) 1.2 % Reduct Depth: (cm) 0.1 Epitheli Area: (cm) 1.225 Tunneli Volume: (cm) 0.123 Undermi Wound Description Classification: Full Thickness Without Exposed Support Foul Odo Structures Slough/F Wound Flat and Intact Margin: Exudate Small Amount: Exudate Serosanguineous Type: Exudate red, brown Color: Wound Bed Granulation Amount: Large (67-100%) Granulation Quality: Red Fascia E Necrotic Amount: None Present (0%) Fat Laye Tendon E Muscle E Joint Ex Bone Exp r After Cleansing: No ibrino No Exposed Structure xposed: No r (Subcutaneous Tissue) Exposed: Yes xposed: No xposed: No posed: No osed: No ion in Area: ion in Volume: alization: Small (1-33%) ng: No ning: No Treatment Notes Wound #2 (Left Toe Fifth) 1. Cleanse With Wound Cleanser 3. Primary Dressing Applied Calcium Alginate Ag Other primary dressing (specifiy in notes) 4. Secondary Dressing Dry Gauze Roll Gauze Heel Cup 5. Secured With Tape 7. Footwear/Offloading device applied Other footwear/offloading device (specify in notes) Notes silver alginate to toe. paint with betadine to heel, heel cup, kerlix. offloading heel shoe Electronic Signature(s) Signed: 10/18/2019 5:59:14 PM By: Baruch Gouty RN, BSN Entered By: Baruch Gouty on 10/18/2019 15:05:25 -------------------------------------------------------------------------------- Vitals Details Patient Name: Date of Service: Vernon Frye 10/18/2019 1:15 PM Medical Record [id number] Patient Account Number: [id number] Date of Birth/Sex: Treating RN: 07/09/1961 (59 y.o. Ernestene Mention Primary Care Averlee Swartz: Nicholes Rough Other Clinician: Referring Joniyah Mallinger: Treating Perrin Eddleman/Extender:Robson,  Lorn Junes, Janene Harvey in Treatment: 0 Vital Signs Time Taken: 14:14 Temperature (F): 98.1 Height (in): 75 Pulse (bpm): 70 Source: Stated Respiratory Rate (breaths/min): 18 Weight (lbs): 240 Blood Pressure (mmHg): 128/81 Source: Stated Reference Range: 80 - 120 mg / dl Body Mass Index (BMI): 30 Electronic Signature(s) Signed: 10/18/2019 5:59:14 PM By: Baruch Gouty RN, BSN Entered By: Baruch Gouty on 10/18/2019 14:15:07
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
## 2019-10-18 NOTE — Progress Notes (Signed)
OPAL, CASEBEER ([id number]) Visit Report for 10/18/2019 Abuse/Suicide Risk Screen Details Patient Name: Date of Service: Vernon Frye, Vernon Frye 10/18/2019 1:15 PM Medical Record [id number] Patient Account Number: [id number] Date of Birth/Sex: Treating RN: 07/27/1961 (59 y.o. Ernestene Mention Primary Care Temari Schooler: Nicholes Rough Other Clinician: Referring Shanyce Daris: Treating Jream Broyles/Extender:Robson, Lorn Junes, Janene Harvey in Treatment: 0 Abuse/Suicide Risk Screen Items Answer ABUSE RISK SCREEN: Has anyone close to you tried to hurt or harm you recentlyo No Do you feel uncomfortable with anyone in your familyo No Has anyone forced you do things that you didnt want to doo No Electronic Signature(s) Signed: 10/18/2019 5:59:14 PM By: Baruch Gouty RN, BSN Entered By: Baruch Gouty on 10/18/2019 14:42:31 -------------------------------------------------------------------------------- Activities of Daily Living Details Patient Name: Date of Service: Vernon Frye, Vernon Frye 10/18/2019 1:15 PM Medical Record [id number] Patient Account Number: [id number] Date of Birth/Sex: Treating RN: April 21, 1961 (59 y.o. Ernestene Mention Primary Care Raigen Jagielski: Nicholes Rough Other Clinician: Referring Mercia Dowe: Treating Deniah Saia/Extender:Robson, Lorn Junes, Janene Harvey in Treatment: 0 Activities of Daily Living Items Answer Activities of Daily Living (Please select one for each item) Drive Automobile Not Able Take Medications Completely Able Use Telephone Completely Able Care for Appearance Completely Able Use Toilet Completely Able Bath / Shower Completely Able Dress Self Completely Able Feed Self Completely Able Walk Need Assistance Get In / Out Bed Need Assistance Housework Need Assistance Prepare Meals Need Assistance Handle Money Completely Able Shop for Self Need Assistance Electronic Signature(s) Signed: 10/18/2019 5:59:14 PM By: Baruch Gouty RN, BSN Entered By: Baruch Gouty on 10/18/2019 14:43:40 -------------------------------------------------------------------------------- Education Screening Details Patient Name: Date of Service: Vernon Frye 10/18/2019 1:15 PM Medical Record [id number] Patient Account Number: [id number] Date of Birth/Sex: Treating RN: 1961/01/12 (59 y.o. Ernestene Mention Primary Care Jerita Wimbush: Nicholes Rough Other Clinician: Referring Ademide Schaberg: Treating Whitt Auletta/Extender:Robson, Lorn Junes, Janene Harvey in Treatment: 0 Primary Learner Assessed: Patient Learning Preferences/Education Level/Primary Language Learning Preference: Explanation, Demonstration, Printed Material Highest Education Level: High School Preferred Language: English Cognitive Barrier Language Barrier: No Translator Needed: No Memory Deficit: No Emotional Barrier: No Cultural/Religious Beliefs Affecting Medical Care: No Physical Barrier Impaired Vision: No Impaired Hearing: No Decreased Hand dexterity: No Knowledge/Comprehension Knowledge Level: High Comprehension Level: High Ability to understand written High instructions: Ability to understand verbal High instructions: Motivation Anxiety Level: Calm Cooperation: Cooperative Education Importance: Acknowledges Need Interest in Health Problems: Asks Questions Perception: Coherent Willingness to Engage in Self- High Management Activities: Readiness to Engage in Self- High Management Activities: Electronic Signature(s) Signed: 10/18/2019 5:59:14 PM By: Baruch Gouty RN, BSN Entered By: Baruch Gouty on 10/18/2019 14:44:12 -------------------------------------------------------------------------------- Fall Risk Assessment Details Patient Name: Date of Service: Vernon Frye 10/18/2019 1:15 PM Medical Record [id number] Patient Account Number: [id number] Date of Birth/Sex: Treating RN: [birth year] (59 y.o. Ernestene Mention Primary Care Hagan Maltz: Nicholes Rough Other  Clinician: Referring Estefany Goebel: Treating Eion Timbrook/Extender:Robson, Lorn Junes, Janene Harvey in Treatment: 0 Fall Risk Assessment Items Have you had 2 or more falls in the last 12 monthso 0 No Have you had any fall that resulted in injury in the last 12 monthso 0 Yes FALLS RISK SCREEN History of falling - immediate or within 3 months 25 Yes Secondary diagnosis (Do you have 2 or more medical diagnoseso) 0 No Ambulatory aid None/bed rest/wheelchair/nurse 0 No Crutches/cane/walker 15 Yes Furniture 0 No Intravenous therapy Access/Saline/Heparin Lock 0 No Weak (short steps with or without shuffle, stooped but able to lift head 10 Yes while walking, may seek support from furniture) Impaired (short  steps with shuffle, may have difficulty arising from chair, 0 No head down, impaired balance) Mental Status Oriented to own ability 0 Yes Overestimates or forgets limitations 0 No Risk Level: Medium Risk Score: 50 Electronic Signature(s) Signed: 10/18/2019 5:59:14 PM By: Baruch Gouty RN, BSN Entered By: Baruch Gouty on 10/18/2019 14:45:19 -------------------------------------------------------------------------------- Foot Assessment Details Patient Name: Date of Service: Vernon Frye 10/18/2019 1:15 PM Medical Record [id number] Patient Account Number: [id number] Date of Birth/Sex: Treating RN: 07/08/1961 (59 y.o. Ernestene Mention Primary Care Domonik Levario: Nicholes Rough Other Clinician: Referring Teyonna Plaisted: Treating Yena Tisby/Extender:Robson, Lorn Junes, Janene Harvey in Treatment: 0 Foot Assessment Items Site Locations + = Sensation present, - = Sensation absent, C = Callus, U = Ulcer R = Redness, W = Warmth, M = Maceration, PU = Pre-ulcerative lesion F = Fissure, S = Swelling, D = Dryness Assessment Right: Left: Other Deformity: No No Prior Foot Ulcer: No No Prior Amputation: No No Charcot Joint: No No Ambulatory Status: Ambulatory With Help Assistance Device:  Walker Gait: Steady Electronic Signature(s) Signed: 10/18/2019 5:59:14 PM By: Baruch Gouty RN, BSN Entered By: Baruch Gouty on 10/18/2019 14:47:52 -------------------------------------------------------------------------------- Nutrition Risk Screening Details Patient Name: Date of Service: Vernon Frye, Vernon Frye 10/18/2019 1:15 PM Medical Record Number:[id number] Patient Account Number: [id number] Date of Birth/Sex: Treating RN: Sep 14, 1960 (59 y.o. Ernestene Mention Primary Care Masaru Chamberlin: Nicholes Rough Other Clinician: Referring Galvin Aversa: Treating Cortney Beissel/Extender:Robson, Lorn Junes, Janene Harvey in Treatment: 0 Height (in): 75 Weight (lbs): 240 Body Mass Index (BMI): 30 Nutrition Risk Screening Items Score Screening NUTRITION RISK SCREEN: I have an illness or condition that made me change the kind and/or 0 No amount of food I eat I eat fewer than two meals per day 0 No I eat few fruits and vegetables, or milk products 0 No I have three or more drinks of beer, liquor or wine almost every day 0 No I have tooth or mouth problems that make it hard for me to eat 0 No I don't always have enough money to buy the food I need 0 No I eat alone most of the time 1 Yes I take three or more different prescribed or over-the-counter drugs a day 0 No 0 No Without wanting to, I have lost or gained 10 pounds in the last six months I am not always physically able to shop, cook and/or feed myself 0 No Nutrition Protocols Good Risk Protocol 0 No interventions needed Moderate Risk Protocol High Risk Proctocol Risk Level: Good Risk Score: 1 Electronic Signature(s) Signed: 10/18/2019 5:59:14 PM By: Baruch Gouty RN, BSN Entered By: Baruch Gouty on 10/18/2019 14:45:41

## 2019-10-20 ENCOUNTER — Ambulatory Visit (INDEPENDENT_AMBULATORY_CARE_PROVIDER_SITE_OTHER): Payer: Worker's Compensation | Admitting: Internal Medicine

## 2019-10-20 ENCOUNTER — Encounter: Payer: Self-pay | Admitting: Internal Medicine

## 2019-10-20 ENCOUNTER — Other Ambulatory Visit: Payer: Self-pay

## 2019-10-20 ENCOUNTER — Telehealth: Payer: Self-pay

## 2019-10-20 VITALS — BP 147/92 | HR 73 | Temp 98.1°F

## 2019-10-20 DIAGNOSIS — Z5181 Encounter for therapeutic drug level monitoring: Secondary | ICD-10-CM | POA: Diagnosis not present

## 2019-10-20 DIAGNOSIS — Z452 Encounter for adjustment and management of vascular access device: Secondary | ICD-10-CM

## 2019-10-20 DIAGNOSIS — T8450XD Infection and inflammatory reaction due to unspecified internal joint prosthesis, subsequent encounter: Secondary | ICD-10-CM

## 2019-10-20 NOTE — Assessment & Plan Note (Signed)
This is working well and no issues.  Will determine at the end if it is ok to remove.

## 2019-10-20 NOTE — Telephone Encounter (Signed)
Per MD called [facility] to have labs faxed to office. Last lab results were from 3/17; will fax results to triage.  [facility] Phone: [phone] Fax: [place], [place]

## 2019-10-20 NOTE — Progress Notes (Signed)
   Subjective:    Patient ID: Vernon Frye, male    DOB: Aug 26, 1960, 59 y.o.   MRN: [id number]  HPI Here for hsfu He has a history of MRSA PJI with bacteremia and treated for 6 weeks after I and D with polyexchange and then resection arthroplasty.  After discharge (6 weeks in the hospital) he fell and had more swelling and pain in the knee and ended up with further debridement and new spacer placement on 3/3.  Cultures did not grow anything.  He now has been on daptomycin and projected through April 13 for 6 weeks.  CRP down from 4.7/47 to 33 and ESR down from 69 to 27.  His only complaint is pain.  No fever, no associated rash or diarrhea.         Review of Systems  Constitutional: Negative for chills and fever.  Gastrointestinal: Negative for diarrhea and nausea.       Objective:   Physical Exam Constitutional:      Appearance: Normal appearance.  Eyes:     General: No scleral icterus. Musculoskeletal:     Comments: Some mild edema and warmth.   Skin:    Findings: No rash.  Neurological:     Mental Status: He is alert.  Psychiatric:        Mood and Affect: Mood normal.   SH: no current tobacco        Assessment & Plan:

## 2019-10-20 NOTE — Assessment & Plan Note (Signed)
No leukopenia or any issues noted.  Will get recent CK.

## 2019-10-20 NOTE — Telephone Encounter (Signed)
Faxed patient's office note to case manager as requested. Confirmation received -P:- [phone]- [place]

## 2019-10-20 NOTE — Assessment & Plan Note (Signed)
He is doing well and inflammatory markers reacting well, no concerns.  So far on track to finish antibiotics April 13th.  I will have him follow up near the end of his treatment.

## 2019-10-25 ENCOUNTER — Encounter (HOSPITAL_BASED_OUTPATIENT_CLINIC_OR_DEPARTMENT_OTHER): Payer: Self-pay | Admitting: Internal Medicine

## 2019-10-25 ENCOUNTER — Other Ambulatory Visit: Payer: Self-pay

## 2019-10-26 NOTE — Progress Notes (Addendum)
Vernon Frye, Vernon Frye ([id number]) Visit Report for 10/25/2019 Arrival Information Details Patient Name: Date of Service: Vernon Frye, Vernon Frye 10/25/2019 1:45 PM Medical Record [id number] Patient Account Number: [id number] Date of Birth/Sex: Treating RN: 07-Dec-1960 (59 y.o. Male) Deon Pilling Primary Care Aiyah Scarpelli: Nicholes Rough Other Clinician: Referring Lorn Butcher: Treating Tyaisha Cullom/Extender:Robson, Janeece Agee, Nanci Pina in Treatment: 1 Visit Information History Since Last Visit Added or deleted any medications: No Patient Arrived: Wheel Chair Any new allergies or adverse reactions: No Arrival Time: 14:10 Had a fall or experienced change in No activities of daily living that may affect Accompanied By: self risk of falls: Transfer Assistance: None Signs or symptoms of abuse/neglect No Patient Identification Verified: Yes since last visito Secondary Verification Process Completed: Yes Hospitalized since last visit: No Patient Requires Transmission-Based No Implantable device outside of the clinic No Precautions: excluding Patient Has Alerts: No cellular tissue based products placed in the center since last visit: Has Dressing in Place as Prescribed: Yes Has Footwear/Offloading in Place as Yes Prescribed: Left: Multipodus Split/Boot Pain Present Now: Yes Electronic Signature(s) Signed: 10/26/2019 5:08:19 PM By: Deon Pilling Entered By: Deon Pilling on 10/25/2019 14:48:14 -------------------------------------------------------------------------------- Encounter Discharge Information Details Patient Name: Date of Service: Vernon Frye 10/25/2019 1:45 PM Medical Record Number:[id number] Patient Account Number: [id number] Date of Birth/Sex: Treating RN: 11/26/1960 (59 y.o. Male) Kela Millin Primary Care Agostino Gorin: Nicholes Rough Other Clinician: Referring Velma Agnes: Treating Shantara Goosby/Extender:Robson, Janeece Agee, Nanci Pina in Treatment: 1 Encounter  Discharge Information Items Post Procedure Vitals Discharge Condition: Stable Temperature (F): 98.3 Ambulatory Status: Wheelchair Pulse (bpm): 79 Discharge Destination: Home Respiratory Rate (breaths/min): 18 Transportation: Private Auto Blood Pressure (mmHg): 102/65 Accompanied By: case worker Schedule Follow-up Appointment: Yes Clinical Summary of Care: Patient Declined Electronic Signature(s) Signed: 10/31/2019 4:55:48 PM By: Kela Millin Entered By: Kela Millin on 10/28/2019 08:25:23 -------------------------------------------------------------------------------- Lower Extremity Assessment Details Patient Name: Date of Service: Vernon Frye 10/25/2019 1:45 PM Medical Record Number:[id number] Patient Account Number: [id number] Date of Birth/Sex: Treating RN: Jan 11, 1961 (59 y.o. Male) Deon Pilling Primary Care Taleyah Hillman: Nicholes Rough Other Clinician: Referring Iva Posten: Treating Arsalan Brisbin/Extender:Robson, Janeece Agee, Pietro Cassis Weeks in Treatment: 1 Edema Assessment Assessed: [Left: Yes] [Right: No] Edema: [Left: Ye] [Right: s] Calf Left: Right: Point of Measurement: cm From Medial Instep 42 cm cm Ankle Left: Right: Point of Measurement: cm From Medial Instep 26 cm cm Electronic Signature(s) Signed: 10/26/2019 5:08:19 PM By: Deon Pilling Entered By: Deon Pilling on 10/25/2019 14:49:05 -------------------------------------------------------------------------------- Multi Wound Chart Details Patient Name: Date of Service: Vernon Frye 10/25/2019 1:45 PM Medical Record Number:[id number] Patient Account Number: [id number] Date of Birth/Sex: Treating RN: 08/31/1960 (59 y.o. Male) [name] Primary Care Wildon Cuevas: Nicholes Rough Other Clinician: Referring Keera Altidor: Treating Miraj Truss/Extender:Robson, Janeece Agee, Pietro Cassis Weeks in Treatment: 1 Vital Signs Height(in): 75 Pulse(bpm): 61 Weight(lbs): 240 Blood Pressure(mmHg): 102/65 Body Mass Index(BMI):  30 Temperature(F): 98.3 Respiratory 18 Rate(breaths/min): Photos: [1:No Photos] [2:No Photos] [N/A:N/A] Wound Location: [1:Left Calcaneus] [2:Left Toe Fifth] [N/A:N/A] Wounding Event: [1:Pressure Injury] [2:Pressure Injury] [N/A:N/A] Primary Etiology: [1:Pressure Ulcer] [2:Arterial Insufficiency Ulcer] [N/A:N/A] Comorbid History: [1:Sleep Apnea, Arrhythmia, Hypertension] [2:Sleep Apnea, Arrhythmia, Hypertension] [N/A:N/A] Date Acquired: [1:07/17/2019] [2:07/17/2019] [N/A:N/A] Weeks of Treatment: [1:1] [2:1] [N/A:N/A] Wound Status: [1:Open] [2:Open] [N/A:N/A] Measurements L x W x D 4.2x4x0.1 [2:0.9x0.9x0.1] [N/A:N/A] (cm) Area (cm) : [1:13.195] [2:0.636] [N/A:N/A] Volume (cm) : [1:1.319] [2:0.064] [N/A:N/A] % Reduction in Area: [1:13.20%] [2:48.10%] [N/A:N/A] % Reduction in Volume: 13.20% [2:48.00%] [N/A:N/A] Classification: [1:Unstageable/Unclassified] [2:Full Thickness Without Exposed Support Structures] [N/A:N/A] Exudate Amount: [1:None Present] [  2:Small] [N/A:N/A] Exudate Type: [1:N/A] [2:Serosanguineous] [N/A:N/A] Exudate Color: [1:N/A] [2:red, brown] [N/A:N/A] Wound Margin: [1:Flat and Intact] [2:Flat and Intact] [N/A:N/A] Granulation Amount: [1:None Present (0%)] [2:Medium (34-66%)] [N/A:N/A] Granulation Quality: [1:N/A] [2:Red] [N/A:N/A] Necrotic Amount: [1:Large (67-100%)] [2:Medium (34-66%)] [N/A:N/A] Necrotic Tissue: [1:Eschar] [2:Adherent Slough] [N/A:N/A] Exposed Structures: [1:Fascia: No Fat Layer (Subcutaneous Tissue) Exposed: No Tendon: No Muscle: No Joint: No Bone: No] [2:Fat Layer (Subcutaneous N/A Tissue) Exposed: Yes Fascia: No Tendon: No Muscle: No Joint: No Bone: No] Epithelialization: [1:None] [2:Small (1-33%)] [N/A:N/A] Debridement: [1:N/A] [2:Debridement - Excisional N/A] Pre-procedure [1:N/A] [2:14:43] [N/A:N/A] Verification/Time Out Taken: Tissue Debrided: [1:N/A] [2:Subcutaneous, Slough] [N/A:N/A] Level: [1:N/A] [2:Skin/Subcutaneous Tissue]  [N/A:N/A] Debridement Area (sq cm):N/A [2:0.81] [N/A:N/A] Instrument: [1:N/A] [2:Curette] [N/A:N/A] Bleeding: [1:N/A] [2:Minimum] [N/A:N/A] Hemostasis Achieved: [1:N/A] [2:Pressure] [N/A:N/A] Procedural Pain: [1:N/A] [2:0] [N/A:N/A] Post Procedural Pain: [1:N/A] [2:0] [N/A:N/A] Debridement Treatment N/A [2:Procedure was tolerated] [N/A:N/A] Response: [2:well] Post Debridement [1:N/A] [2:0.9x0.9x0.1] Measurements L x W x D (cm) Post Debridement [1:N/A] [2:0.064] Volume: (cm) Procedures Performed: N/A [2:Debridement] Treatment Notes Wound #1 (Left Calcaneus) 1. Cleanse With Wound Cleanser 3. Primary Dressing Applied Other primary dressing (specifiy in notes) 4. Secondary Dressing Dry Gauze Roll Gauze 5. Secured With Tape Notes silver alginate to toe. paint with betadine to heel, heel cup, kerlix. offloading heel shoe Wound #2 (Left Toe Fifth) 1. Cleanse With Wound Cleanser 3. Primary Dressing Applied Calcium Alginate Ag 4. Secondary Dressing Dry Gauze 5. Secured With Tape Notes silver alginate to toe. paint with betadine to heel, heel cup, kerlix. offloading heel shoe Electronic Signature(s) Signed: 10/28/2019 4:56:09 PM By: [name] RN Signed: 10/31/2019 7:44:34 AM By: [name] MD Entered By: [name] on 10/28/2019 09:58:46 -------------------------------------------------------------------------------- Multi-Disciplinary Care Plan Details Patient Name: Date of Service: Vernon Frye, Vernon Frye 10/25/2019 1:45 PM Medical Record [id number] Patient Account Number: [id number] Date of Birth/Sex: Treating RN: January 19, 1961 (58 y.o. Male) [name] Primary Care Jazell Rosenau: Nicholes Rough Other Clinician: Referring Stiven Kaspar: Treating Brandi Armato/Extender:Robson, Janeece Agee, Nanci Pina in Treatment: 1 Active Inactive Wound/Skin Impairment Nursing Diagnoses: Knowledge deficit related to ulceration/compromised skin integrity Goals: Patient/caregiver will  verbalize understanding of skin care regimen Date Initiated: 10/18/2019 Target Resolution Date: 11/18/2019 Goal Status: Active Ulcer/skin breakdown will have a volume reduction of 30% by week 4 Date Initiated: 10/18/2019 Target Resolution Date: 11/18/2019 Goal Status: Active Interventions: Assess patient/caregiver ability to obtain necessary supplies Assess patient/caregiver ability to perform ulcer/skin care regimen upon admission and as needed Assess ulceration(s) every visit Notes: Electronic Signature(s) Signed: 10/26/2019 5:40:01 PM By: [name] RN Entered By: [name] on 10/25/2019 14:40:59 -------------------------------------------------------------------------------- Pain Assessment Details Patient Name: Date of Service: Vernon Frye, Vernon Frye 10/25/2019 1:45 PM Medical Record [id number] Patient Account Number: [id number] Date of Birth/Sex: Treating RN: 10-17-1960 (58 y.o. Male) Deon Pilling Primary Care Alawna Graybeal: Nicholes Rough Other Clinician: Referring Brylynn Hanssen: Treating Jemaine Prokop/Extender:Robson, Janeece Agee, Pietro Cassis Weeks in Treatment: 1 Active Problems Location of Pain Severity and Description of Pain Patient Has Paino Yes Site Locations Pain Location: Pain in Ulcers Rate the pain. Current Pain Level: 4 Worst Pain Level: 10 Least Pain Level: 0 Tolerable Pain Level: 8 Pain Management and Medication Current Pain Management: Medication: No Cold Application: No Rest: No Massage: No Activity: No T.E.N.S.: No Heat Application: No Leg drop or elevation: No Is the Current Pain Management Adequate: Adequate How does your wound impact your activities of daily livingo Sleep: No Bathing: No Appetite: No Relationship With Others: No Bladder Continence: No Emotions: No Bowel Continence: No Work: No Toileting: No Drive: No  Dressing: No Hobbies: No Electronic Signature(s) Signed: 10/26/2019 5:08:19 PM By: Deon Pilling Entered By: Deon Pilling on  10/25/2019 14:48:55 -------------------------------------------------------------------------------- Patient/Caregiver Education Details Patient Name: Vernon Frye 3/30/2021andnbsp1:45 Date of Service: PM Medical Record [id number] Number: Patient Account Number: [id number] Treating RN: 08-01-1960 (59 y.o. [name] Date of Birth/Gender: Male) Other Clinician: Primary Care Physician:Beal, Sheri Treating [name] Referring Physician: Physician/Extender: Jacquelynn Cree in Treatment: 1 Education Assessment Education Provided To: Patient Education Topics Provided Wound/Skin Impairment: Methods: Explain/Verbal Responses: State content correctly Electronic Signature(s) Signed: 10/26/2019 5:40:01 PM By: [name] RN Entered By: [name] on 10/25/2019 14:41:11 -------------------------------------------------------------------------------- Wound Assessment Details Patient Name: Date of Service: Vernon Frye, Vernon Frye 10/25/2019 1:45 PM Medical Record [id number] Patient Account Number: [id number] Date of Birth/Sex: Treating RN: 1961/02/08 (58 y.o. Male) Deon Pilling Primary Care Laynie Espy: Nicholes Rough Other Clinician: Referring Floy Angert: Treating Simuel Stebner/Extender:Robson, Janeece Agee, Pietro Cassis Weeks in Treatment: 1 Wound Status Wound Number: 1 Primary Etiology: Pressure Ulcer Wound Location: Left Calcaneus Wound Status: Open Wounding Event: Pressure Injury Comorbid Sleep Apnea, Arrhythmia, History: Hypertension Date Acquired: 07/17/2019 Weeks Of Treatment: 1 Clustered Wound: No Photos Photo Uploaded By: Mikeal Hawthorne on 10/28/2019 11:04:14 Wound Measurements Length: (cm) 4.2 % Reduction Width: (cm) 4 % Reduction Depth: (cm) 0.1 Epithelializ Area: (cm) 13.195 Tunneling: Volume: (cm) 1.319 Undermining Wound Description Classification: Unstageable/Unclassified Foul Odor Wound Margin: Flat and Intact Slough/Fib Exudate Amount: None  Present Wound Bed Granulation Amount: None Present (0%) Necrotic Amount: Large (67-100%) Fascia Expo Necrotic Quality: Eschar Fat Layer ( Tendon Expo Muscle Expo Joint Expos Bone Expose After Cleansing: No rino No Exposed Structure sed: No Subcutaneous Tissue) Exposed: No sed: No sed: No ed: No d: No in Area: 13.2% in Volume: 13.2% ation: None No : No Treatment Notes Wound #1 (Left Calcaneus) 1. Cleanse With Wound Cleanser 3. Primary Dressing Applied Other primary dressing (specifiy in notes) 4. Secondary Dressing Dry Gauze Roll Gauze 5. Secured With Tape Notes silver alginate to toe. paint with betadine to heel, heel cup, kerlix. offloading heel shoe Electronic Signature(s) Signed: 10/26/2019 5:08:19 PM By: Deon Pilling Entered By: Deon Pilling on 10/25/2019 14:49:34 -------------------------------------------------------------------------------- Wound Assessment Details Patient Name: Date of Service: Vernon Frye, Vernon Frye 10/25/2019 1:45 PM Medical Record [id number] Patient Account Number: [id number] Date of Birth/Sex: Treating RN: 1960-10-06 (58 y.o. Male) Deon Pilling Primary Care Cort Dragoo: Nicholes Rough Other Clinician: Referring Sheriden Archibeque: Treating Shamirah Ivan/Extender:Robson, Janeece Agee, Pietro Cassis Weeks in Treatment: 1 Wound Status Wound Number: 2 Primary Etiology: Arterial Insufficiency Ulcer Wound Location: Left Toe Fifth Wound Status: Open Wounding Event: Pressure Injury Comorbid Sleep Apnea, Arrhythmia, History: Hypertension Date Acquired: 07/17/2019 Weeks Of Treatment: 1 Clustered Wound: No Photos Photo Uploaded By: Mikeal Hawthorne on 10/28/2019 11:04:14 Wound Measurements Length: (cm) 0.9 Width: (cm) 0.9 Depth: (cm) 0.1 Area: (cm) 0.636 Volume: (cm) 0.064 Wound Description Classification: Full Thickness Without Exposed Support Structures Wound Flat and Intact Margin: Exudate Small Amount: Exudate  Serosanguineous Type: Exudate red, brown Color: Wound Bed Granulation Amount: Medium (34-66%) Granulation Quality: Red Necrotic Amount: Medium (34-66%) Necrotic Quality: Adherent Slough Foul Odor After Cleansing: No Slough/Fibrino Yes Exposed Structure Fascia Exposed: No Fat Layer (Subcutaneous Tissue) Exposed: Yes Tendon Exposed: No Muscle Exposed: No Joint Exposed: No Bone Exposed: No % Reduction in Area: 48.1% % Reduction in Volume: 48% Epithelialization: Small (1-33%) Tunneling: No Undermining: No Treatment Notes Wound #2 (Left Toe Fifth) 1. Cleanse With Wound Cleanser 3. Primary Dressing Applied Calcium Alginate Ag 4. Secondary Dressing Dry Gauze 5. Secured With  Tape Notes silver alginate to toe. paint with betadine to heel, heel cup, kerlix. offloading heel shoe Electronic Signature(s) Signed: 10/26/2019 5:08:19 PM By: Deon Pilling Entered By: Deon Pilling on 10/25/2019 14:50:00 -------------------------------------------------------------------------------- Vitals Details Patient Name: Date of Service: Vernon Frye 10/25/2019 1:45 PM Medical Record [id number] Patient Account Number: [id number] Date of Birth/Sex: Treating RN: 1961-02-26 (58 y.o. Male) Rolin Barry, [place] Primary Care Constantina Laseter: Nicholes Rough Other Clinician: Referring Aiyah Scarpelli: Treating Hitesh Fouche/Extender:Robson, Janeece Agee, Pietro Cassis Weeks in Treatment: 1 Vital Signs Time Taken: 14:10 Temperature (F): 98.3 Height (in): 75 Pulse (bpm): 79 Weight (lbs): 240 Respiratory Rate (breaths/min): 18 Body Mass Index (BMI): 30 Blood Pressure (mmHg): 102/65 Reference Range: 80 - 120 mg / dl Electronic Signature(s) Signed: 10/26/2019 5:08:19 PM By: Deon Pilling Entered By: Deon Pilling on 10/25/2019 14:48:38
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
## 2019-11-01 ENCOUNTER — Other Ambulatory Visit: Payer: Self-pay

## 2019-11-01 ENCOUNTER — Other Ambulatory Visit (HOSPITAL_COMMUNITY): Payer: Self-pay | Admitting: Internal Medicine

## 2019-11-01 ENCOUNTER — Encounter (HOSPITAL_BASED_OUTPATIENT_CLINIC_OR_DEPARTMENT_OTHER): Payer: Self-pay | Attending: Internal Medicine | Admitting: Internal Medicine

## 2019-11-01 ENCOUNTER — Ambulatory Visit (HOSPITAL_COMMUNITY)
Admission: RE | Admit: 2019-11-01 | Discharge: 2019-11-01 | Disposition: A | Discharge status: Home / Self Care | Payer: No Typology Code available for payment source | Source: Ambulatory Visit | Attending: Cardiology | Admitting: Cardiology

## 2019-11-01 ENCOUNTER — Encounter (HOSPITAL_BASED_OUTPATIENT_CLINIC_OR_DEPARTMENT_OTHER): Payer: Self-pay | Admitting: Internal Medicine

## 2019-11-01 ENCOUNTER — Other Ambulatory Visit (HOSPITAL_COMMUNITY): Payer: Self-pay | Admitting: Physician Assistant

## 2019-11-01 DIAGNOSIS — Z96652 Presence of left artificial knee joint: Secondary | ICD-10-CM | POA: Insufficient documentation

## 2019-11-01 DIAGNOSIS — L89620 Pressure ulcer of left heel, unstageable: Secondary | ICD-10-CM | POA: Insufficient documentation

## 2019-11-01 DIAGNOSIS — I739 Peripheral vascular disease, unspecified: Secondary | ICD-10-CM | POA: Diagnosis not present

## 2019-11-01 DIAGNOSIS — L97521 Non-pressure chronic ulcer of other part of left foot limited to breakdown of skin: Secondary | ICD-10-CM | POA: Insufficient documentation

## 2019-11-01 DIAGNOSIS — I70245 Atherosclerosis of native arteries of left leg with ulceration of other part of foot: Secondary | ICD-10-CM | POA: Insufficient documentation

## 2019-11-01 DIAGNOSIS — L97429 Non-pressure chronic ulcer of left heel and midfoot with unspecified severity: Secondary | ICD-10-CM

## 2019-11-02 ENCOUNTER — Encounter: Payer: Self-pay | Admitting: Internal Medicine

## 2019-11-02 NOTE — Progress Notes (Signed)
CLENTON, JARBO ([id number]) Visit Report for 11/01/2019 Fall Risk Assessment Details Patient Name: Date of Service: Vernon Frye, Vernon Frye 11/01/2019 3:30 PM Medical Record [id number] Patient Account Number: [id number] Date of Birth/Sex: Treating RN: Nov 25, 1960 (59 y.o. Ernestene Mention Primary Care Cortnie Ringel: Nicholes Rough Other Clinician: Referring Claira Jeter: Treating Caylen Kuwahara/Extender:Robson, Lorn Junes, Janene Harvey in Treatment: 2 Fall Risk Assessment Items Have you had 2 or more falls in the last 12 monthso 0 Yes Have you had any fall that resulted in injury in the last 12 monthso 0 No FALLS RISK SCREEN History of falling - immediate or within 3 months 25 Yes Secondary diagnosis (Do you have 2 or more medical diagnoseso) 0 No Ambulatory aid None/bed rest/wheelchair/nurse 0 No Crutches/cane/walker 15 Yes Furniture 0 No Intravenous therapy Access/Saline/Heparin Lock 0 No Weak (short steps with or without shuffle, stooped but able to lift head 10 Yes while walking, may seek support from furniture) Impaired (short steps with shuffle, may have difficulty arising from chair, 0 No head down, impaired balance) Mental Status Oriented to own ability 0 Yes Overestimates or forgets limitations 0 No Risk Level: Medium Risk Score: 50 Electronic Signature(s) Signed: 11/01/2019 6:21:28 PM By: Baruch Gouty RN, BSN Entered By: Baruch Gouty on 11/01/2019 16:44:39
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
## 2019-11-03 ENCOUNTER — Encounter: Payer: Self-pay | Admitting: Internal Medicine

## 2019-11-03 ENCOUNTER — Other Ambulatory Visit: Payer: Self-pay

## 2019-11-03 ENCOUNTER — Ambulatory Visit (INDEPENDENT_AMBULATORY_CARE_PROVIDER_SITE_OTHER): Payer: Worker's Compensation | Admitting: Internal Medicine

## 2019-11-03 VITALS — BP 114/75 | HR 79 | Temp 97.9°F

## 2019-11-03 DIAGNOSIS — Z452 Encounter for adjustment and management of vascular access device: Secondary | ICD-10-CM | POA: Diagnosis not present

## 2019-11-03 DIAGNOSIS — Z5181 Encounter for therapeutic drug level monitoring: Secondary | ICD-10-CM

## 2019-11-03 DIAGNOSIS — M00062 Staphylococcal arthritis, left knee: Secondary | ICD-10-CM

## 2019-11-03 DIAGNOSIS — T8450XD Infection and inflammatory reaction due to unspecified internal joint prosthesis, subsequent encounter: Secondary | ICD-10-CM

## 2019-11-07 ENCOUNTER — Telehealth: Payer: Self-pay | Admitting: *Deleted

## 2019-11-07 NOTE — Progress Notes (Signed)
   Subjective:    Patient ID: Vernon Frye, male    DOB: October 17, 1960, 59 y.o.   MRN: [id number]  HPI Here for hsfu He has a history of MRSA PJI with bacteremia and treated for 6 weeks after I and D with polyexchange and then resection arthroplasty.  After discharge (6 weeks in the hospital) he fell and had more swelling and pain in the knee and ended up with further debridement and new spacer placement on 3/3.  Cultures did not grow anything.  He now has been on daptomycin and projected through April 13 for 6 weeks.  He is doing well, incision nearly closed and no significant pain or swelling.  No fever or associated rash or diarrhea.       Review of Systems  Constitutional: Negative for chills and fever.  Gastrointestinal: Negative for diarrhea and nausea.       Objective:   Physical Exam Constitutional:      Appearance: Normal appearance.  Eyes:     General: No scleral icterus. Musculoskeletal:     Comments: No edema or warmth  Skin:    Findings: No rash.  Neurological:     Mental Status: He is alert.  Psychiatric:        Mood and Affect: Mood normal.   SH: no current tobacco        Assessment & Plan:

## 2019-11-07 NOTE — Telephone Encounter (Signed)
RN called [facility], spoke with pharmacist [facility]. ESR and CRP not drawn by home health.  He will reach out to them today and have them draw it at the next visit (likely today or 4/13). Landis Gandy, RN

## 2019-11-07 NOTE — Telephone Encounter (Signed)
-----  Message from Thayer Headings, MD sent at 11/07/2019 10:34 AM EDT ----- Could you let me know what his most recent CRP and ESR are?  I had requested a copy Thursday to be faxed from last weeks labs   thanks

## 2019-11-07 NOTE — Assessment & Plan Note (Signed)
Working well now and will be removed after the last dose by home health if CRP and ESR ok.

## 2019-11-07 NOTE — Assessment & Plan Note (Signed)
No issues with creat or WBC.  Will continue to monitor.

## 2019-11-07 NOTE — Assessment & Plan Note (Signed)
He is clinically doing well and I will check his most recent CRP and ESR (not available at the time of the visit) to be sure they are reassuring.  If ok, will plan to stop 4/13 at the 6 week point and observe off of antibiotics.

## 2019-11-08 ENCOUNTER — Encounter (HOSPITAL_BASED_OUTPATIENT_CLINIC_OR_DEPARTMENT_OTHER): Payer: Self-pay | Admitting: Internal Medicine

## 2019-11-08 ENCOUNTER — Other Ambulatory Visit: Payer: Self-pay
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
## 2019-11-08 NOTE — Progress Notes (Signed)
KAYCEE, WINCKLER ([id number]) Visit Report for 11/08/2019 Arrival Information Details Patient Name: Date of Service: HUBERT, GREGER 11/08/2019 10:00 AM Medical Record Number:[id number] Patient Account Number: [id number] Date of Birth/Sex: Treating RN: 04/16/1961 (59 y.o. Marvis Repress Primary Care Chablis Losh: Nicholes Rough Other Clinician: Referring Samrat Hayward: Treating Anabeth Chilcott/Extender:Robson, Lorn Junes, Janene Harvey in Treatment: 3 Visit Information History Since Last Visit Added or deleted any medications: No Patient Arrived: Wheel Chair Any new allergies or adverse reactions: No Arrival Time: 10:13 Had a fall or experienced change in No caseworker activities of daily living that may affect Accompanied By: risk of falls: Transfer Assistance: None Signs or symptoms of abuse/neglect since last No Patient Identification Verified: Yes visito Secondary Verification Process Completed: Yes Hospitalized since last visit: No Patient Requires Transmission-Based No Implantable device outside of the clinic excluding No Precautions: cellular tissue based products placed in the center Patient Has Alerts: No since last visit: Has Dressing in Place as Prescribed: Yes Pain Present Now: No Electronic Signature(s) Signed: 11/08/2019 5:52:37 PM By: Kela Millin Entered By: Kela Millin on 11/08/2019 10:13:35 -------------------------------------------------------------------------------- Encounter Discharge Information Details Patient Name: Date of Service: [name]. 11/08/2019 10:00 AM Medical Record Number:[id number] Patient Account Number: [id number] Date of Birth/Sex: Treating RN: 12-14-1960 (59 y.o. Marvis Repress Primary Care Tiyona Desouza: Nicholes Rough Other Clinician: Referring Abner Ardis: Treating Janaysha Depaulo/Extender:Robson, Lorn Junes, Janene Harvey in Treatment: 3 Encounter Discharge Information Items Post Procedure Vitals Discharge Condition:  Stable Temperature (F): 98.1 Ambulatory Status: Wheelchair Pulse (bpm): 76 Discharge Destination: Home Respiratory Rate (breaths/min): 19 Transportation: Other Blood Pressure (mmHg): 126/66 Accompanied By: caseworker Schedule Follow-up Appointment: Yes Clinical Summary of Care: Patient Declined Electronic Signature(s) Signed: 11/08/2019 5:52:37 PM By: Kela Millin Entered By: Kela Millin on 11/08/2019 11:32:55 -------------------------------------------------------------------------------- Lower Extremity Assessment Details Patient Name: Date of Service: NICKLOS, COBBS 11/08/2019 10:00 AM Medical Record [id number] Patient Account Number: [id number] Date of Birth/Sex: Treating RN: 02-08-1961 (59 y.o. Marvis Repress Primary Care Naythan Douthit: Nicholes Rough Other Clinician: Referring Calista Crain: Treating Shaleigh Laubscher/Extender:Robson, Lorn Junes, Janene Harvey in Treatment: 3 Edema Assessment Assessed: [Left: No] [Right: No] Edema: [Left: Ye] [Right: s] Calf Left: Right: Point of Measurement: cm From Medial Instep 41 cm cm Ankle Left: Right: Point of Measurement: cm From Medial Instep 27.5 cm cm Vascular Assessment Pulses: Dorsalis Pedis Palpable: [Left:Yes] Electronic Signature(s) Signed: 11/08/2019 5:52:37 PM By: Kela Millin Entered By: Kela Millin on 11/08/2019 10:14:10 -------------------------------------------------------------------------------- Multi Wound Chart Details Patient Name: Date of Service: [name]. 11/08/2019 10:00 AM Medical Record [id number] Patient Account Number: [id number] Date of Birth/Sex: Treating RN: 17-Dec-1960 (59 y.o. Hessie Diener Primary Care Myshawn Chiriboga: Nicholes Rough Other Clinician: Referring Roberts Bon: Treating Mazzie Brodrick/Extender:Robson, Lorn Junes, Janene Harvey in Treatment: 3 Vital Signs Height(in): 75 Pulse(bpm): 43 Weight(lbs): 240 Blood Pressure(mmHg): 126/66 Body Mass Index(BMI):  30 Temperature(F): 98.1 Respiratory 19 Rate(breaths/min): Photos: [1:No Photos] [2:No Photos] [N/A:N/A] Wound Location: [1:Left Calcaneus] [2:Left Toe Fifth] [N/A:N/A] Wounding Event: [1:Pressure Injury] [2:Pressure Injury] [N/A:N/A] Primary Etiology: [1:Pressure Ulcer] [2:Arterial Insufficiency Ulcer] [N/A:N/A] Comorbid History: [1:Sleep Apnea, Arrhythmia, Hypertension] [2:Sleep Apnea, Arrhythmia, Hypertension] [N/A:N/A] Date Acquired: [1:07/17/2019] [2:07/17/2019] [N/A:N/A] Weeks of Treatment: [1:3] [2:3] [N/A:N/A] Wound Status: [1:Open] [2:Open] [N/A:N/A] Measurements L x W x D 4.6x4x0.1 [2:0.5x0.5x0.1] [N/A:N/A] (cm) Area (cm) : [1:14.451] [2:0.196] [N/A:N/A] Volume (cm) : [1:1.445] [2:0.02] [N/A:N/A] % Reduction in Area: [1:4.90%] [2:84.00%] [N/A:N/A] % Reduction in Volume: 4.90% [2:83.70%] [N/A:N/A] Classification: [1:Unstageable/Unclassified] [2:Full Thickness Without Exposed Support Structures] [N/A:N/A] Exudate Amount: [1:None Present] [2:Small] [N/A:N/A] Exudate Type: [1:N/A] [2:Serosanguineous] [N/A:N/A] Exudate Color: [  1:N/A] [2:red, brown] [N/A:N/A] Wound Margin: [1:Flat and Intact] [2:Flat and Intact] [N/A:N/A] Granulation Amount: [1:None Present (0%)] [2:Large (67-100%)] [N/A:N/A] Granulation Quality: [1:N/A] [2:Red] [N/A:N/A] Necrotic Amount: [1:Large (67-100%)] [2:Small (1-33%)] [N/A:N/A] Necrotic Tissue: [1:Eschar] [2:Adherent Slough] [N/A:N/A] Exposed Structures: [1:Fascia: No Fat Layer (Subcutaneous Tissue) Exposed: No Tendon: No Muscle: No Joint: No Bone: No] [2:Fat Layer (Subcutaneous N/A Tissue) Exposed: Yes Fascia: No Tendon: No Muscle: No Joint: No Bone: No] Epithelialization: [1:Small (1-33%)] [2:Small (1-33%)] [N/A:N/A] Debridement: [1:Debridement - Selective/Open Wound] [2:N/A] [N/A:N/A] Pre-procedure [1:11:00] [2:N/A] [N/A:N/A] Verification/Time Out Taken: Pain Control: [1:Lidocaine 4% Topical Solution] [2:N/A] [N/A:N/A] Tissue Debrided:  [1:Necrotic/Eschar] [2:N/A] [N/A:N/A] Level: [1:Non-Viable Tissue] [2:N/A] [N/A:N/A] Debridement Area (sq cm):18.4 [2:N/A] [N/A:N/A] Instrument: [1:Blade] [2:N/A] [N/A:N/A] Bleeding: [1:None] [2:N/A] [N/A:N/A] Procedural Pain: [1:0] [2:N/A] [N/A:N/A] Post Procedural Pain: [1:0] [2:N/A] [N/A:N/A] Debridement Treatment [1:Procedure was tolerated] [2:N/A] [N/A:N/A] Response: [1:well] Post Debridement [1:4.6x4x0.1] [2:N/A] [N/A:N/A] Measurements L x W x D (cm) Post Debridement [1:1.445] [2:N/A] [N/A:N/A] Volume: (cm) Post Debridement Stage: [1:Unstageable/Unclassified Debridement] [2:N/A N/A] [N/A:N/A N/A] Treatment Notes Wound #1 (Left Calcaneus) 1. Cleanse With Wound Cleanser 2. Periwound Care Skin Prep 3. Primary Dressing Applied Calcium Alginate Ag Santyl 4. Secondary Dressing Dry Gauze Roll Gauze Heel Cup 5. Secured With Tape Notes silver alginate to toe and santyl to the heel Wound #2 (Left Toe Fifth) 1. Cleanse With Wound Cleanser 2. Periwound Care Skin Prep 3. Primary Dressing Applied Calcium Alginate Ag Santyl 4. Secondary Dressing Dry Gauze Roll Gauze Heel Cup 5. Secured With Tape Notes silver alginate to toe and santyl to the heel Electronic Signature(s) Signed: 11/08/2019 5:51:00 PM By: [name] MD Signed: 11/08/2019 6:03:05 PM By: Deon Pilling Entered By: [name] on 11/08/2019 11:34:19 -------------------------------------------------------------------------------- Multi-Disciplinary Care Plan Details Patient Name: Date of Service: [name]. 11/08/2019 10:00 AM Medical Record [id number] Patient Account Number: [id number] Date of Birth/Sex: Treating RN: 27-Apr-1961 (59 y.o. Hessie Diener Primary Care Ramzy Cappelletti: Nicholes Rough Other Clinician: Referring Rily Nickey: Treating Denette Hass/Extender:Robson, Lorn Junes, Janene Harvey in Treatment: 3 Active Inactive Wound/Skin Impairment Nursing Diagnoses: Knowledge deficit  related to ulceration/compromised skin integrity Goals: Patient/caregiver will verbalize understanding of skin care regimen Date Initiated: 10/18/2019 Target Resolution Date: 11/18/2019 Goal Status: Active Ulcer/skin breakdown will have a volume reduction of 30% by week 4 Date Initiated: 10/18/2019 Target Resolution Date: 11/18/2019 Goal Status: Active Interventions: Assess patient/caregiver ability to obtain necessary supplies Assess patient/caregiver ability to perform ulcer/skin care regimen upon admission and as needed Assess ulceration(s) every visit Notes: Electronic Signature(s) Signed: 11/08/2019 6:03:05 PM By: Deon Pilling Entered By: Deon Pilling on 11/08/2019 10:00:46 -------------------------------------------------------------------------------- Pain Assessment Details Patient Name: Date of Service: IZEA, BRENSINGER 11/08/2019 10:00 AM Medical Record Number:[id number] Patient Account Number: [id number] Date of Birth/Sex: Treating RN: January 25, 1961 (59 y.o. Marvis Repress Primary Care Keia Rask: Nicholes Rough Other Clinician: Referring Arthea Nobel: Treating Zorian Gunderman/Extender:Robson, Lorn Junes, Janene Harvey in Treatment: 3 Active Problems Location of Pain Severity and Description of Pain Patient Has Paino No Site Locations Pain Management and Medication Current Pain Management: Electronic Signature(s) Signed: 11/08/2019 5:52:37 PM By: Kela Millin Entered By: Kela Millin on 11/08/2019 10:14:00 -------------------------------------------------------------------------------- Patient/Caregiver Education Details Patient Name: [name] 4/13/2021andnbsp10:00 Date of Service: AM Medical Record [id number] Number: Patient Account Number: [id number] Treating RN: Date of Birth/Gender: 1961-04-11 (59 y.o. Hessie Diener) Other Clinician: Primary Care Physician: Nicholes Rough Treating [name] Referring Physician: Physician/Extender: Edison Pace in Treatment: 3 Education Assessment Education Provided To: Patient Education Topics Provided Wound/Skin Impairment: Handouts: Skin Care Do's and  Dont's Methods: Explain/Verbal Responses: Reinforcements needed Electronic Signature(s) Signed: 11/08/2019 6:03:05 PM By: Deon Pilling Entered By: Deon Pilling on 11/08/2019 10:00:58 -------------------------------------------------------------------------------- Wound Assessment Details Patient Name: Date of Service: EVEREST, WIGFALL 11/08/2019 10:00 AM Medical Record Number:[id number] Patient Account Number: [id number] Date of Birth/Sex: Treating RN: 02-14-1961 (59 y.o. Marvis Repress Primary Care Scotty Pinder: Nicholes Rough Other Clinician: Referring Nusrat Encarnacion: Treating Shamecca Whitebread/Extender:Robson, Lorn Junes, Janene Harvey in Treatment: 3 Wound Status Wound Number: 1 Primary Etiology: Pressure Ulcer Wound Location: Left Calcaneus Wound Status: Open Wounding Event: Pressure Injury Comorbid Sleep Apnea, Arrhythmia, History: Hypertension Date Acquired: 07/17/2019 Weeks Of Treatment: 3 Clustered Wound: No Wound Measurements Length: (cm) 4.6 % Reduction in Ar Width: (cm) 4 % Reduction in Vo Depth: (cm) 0.1 Epithelialization Area: (cm) 14.451 Tunneling: Volume: (cm) 1.445 Undermining: Wound Description Classification: Unstageable/Unclassified Foul Odor After Wound Margin: Flat and Intact Slough/Fibrino Exudate Amount: None Present Wound Bed Granulation Amount: None Present (0%) Necrotic Amount: Large (67-100%) Fascia Exposed: Necrotic Quality: Eschar Fat Layer (Subcut Tendon Exposed: Muscle Exposed: Joint Exposed: Bone Exposed: Cleansing: No No Exposed Structure No aneous Tissue) Exposed: No No No No No ea: 4.9% lume: 4.9% : Small (1-33%) No No Treatment Notes Wound #1 (Left Calcaneus) 1. Cleanse With Wound Cleanser 2. Periwound Care Skin Prep 3. Primary Dressing Applied Calcium Alginate  Ag Santyl 4. Secondary Dressing Dry Gauze Roll Gauze Heel Cup 5. Secured With Tape Notes silver alginate to toe and santyl to the heel Electronic Signature(s) Signed: 11/08/2019 5:52:37 PM By: Kela Millin Entered By: Kela Millin on 11/08/2019 10:16:22 -------------------------------------------------------------------------------- Wound Assessment Details Patient Name: Date of Service: [name]. 11/08/2019 10:00 AM Medical Record [id number] Patient Account Number: [id number] Date of Birth/Sex: Treating RN: 21-Apr-1961 (59 y.o. Marvis Repress Primary Care Kendell Sagraves: Nicholes Rough Other Clinician: Referring Tamella Tuccillo: Treating Timia Casselman/Extender:Robson, Lorn Junes, Janene Harvey in Treatment: 3 Wound Status Wound Number: 2 Primary Etiology: Arterial Insufficiency Ulcer Wound Location: Left Toe Fifth Wound Status: Open Wounding Event: Pressure Injury Comorbid Sleep Apnea, Arrhythmia, History: Hypertension Date Acquired: 07/17/2019 Weeks Of Treatment: 3 Clustered Wound: No Wound Measurements Length: (cm) 0.5 % Reduct Width: (cm) 0.5 % Reduct Depth: (cm) 0.1 Epitheli Area: (cm) 0.196 Tunneli Volume: (cm) 0.02 Undermi Wound Description Classification: Full Thickness Without Exposed Support Foul Od Structures Slough/ Wound Flat and Intact Margin: Exudate Small Amount: Exudate Serosanguineous Type: Exudate red, brown Color: Wound Bed Granulation Amount: Large (67-100%) Granulation Quality: Red Fascia Necrotic Amount: Small (1-33%) Fat Lay Necrotic Quality: Adherent Slough Tendon Muscle Joint E Bone Ex Treatment Notes Wound #2 (Left Toe Fifth) 1. Cleanse With Wound Cleanser 2. Periwound Care Skin Prep 3. Primary Dressing Applied Calcium Alginate Ag Santyl 4. Secondary Dressing Dry Gauze Roll Gauze Heel Cup 5. Secured With Tape or After Cleansing: No Fibrino Yes Exposed Structure Exposed: No er (Subcutaneous Tissue)  Exposed: Yes Exposed: No Exposed: No xposed: No posed: No ion in Area: 84% ion in Volume: 83.7% alization: Small (1-33%) ng: No ning: No Notes silver alginate to toe and santyl to the heel Electronic Signature(s) Signed: 11/08/2019 5:52:37 PM By: Kela Millin Entered By: Kela Millin on 11/08/2019 10:16:38 -------------------------------------------------------------------------------- Vitals Details Patient Name: Date of Service: [name]. 11/08/2019 10:00 AM Medical Record Number:[id number] Patient Account Number: [id number] Date of Birth/Sex: Treating RN: 1961/05/19 (59 y.o. Marvis Repress Primary Care Jeriyah Granlund: Nicholes Rough Other Clinician: Referring Haziel Molner: Treating Olsen Mccutchan/Extender:Robson, Lorn Junes, Janene Harvey in Treatment: 3 Vital Signs Time Taken: 10:10 Temperature (F): 98.1 Height (in): 75 Pulse (bpm):  76 Weight (lbs): 240 Respiratory Rate (breaths/min): 19 Body Mass Index (BMI): 30 Blood Pressure (mmHg): 126/66 Reference Range: 80 - 120 mg / dl Electronic Signature(s) Signed: 11/08/2019 5:52:37 PM By: Kela Millin Entered By: Kela Millin on 11/08/2019 10:13:54

## 2019-11-09 NOTE — Telephone Encounter (Signed)
Vernon Frye from [facility] called for pull PICC orders. Per chart, this was to be determined by ESR and CRP values. Orders were given to pharmacy on 4/12.  Vernon Frye cannot see that these were yet drawn, she will call [facility] to either get values or make sure the labs are drawn today.  Vernon Frye will follow. Landis Gandy, RN

## 2019-11-09 NOTE — Telephone Encounter (Signed)
Patient's workers comp case Dentist left message in triage upset that home health and infusion pharmacy do not know what to do next. She states she was not able to come to the patient's last appointment and that he himself does not remember any details.   RN returned the call, left voicemail informing her that verbal order was given to pharmacy and to home health on 4/12 and 4/14 respectively for ESR and CRP to be drawn per Dr Linus Salmons, that we were waiting for these results to determine the next step.  RN asked Beth to please call back with her fax number so I could send the office note.  Beth Haigler - worker's comp Tourist information centre manager P: [phone] F:

## 2019-11-09 NOTE — Progress Notes (Signed)
Vernon Frye, Vernon Frye ([id number]) Visit Report for 11/01/2019 Arrival Information Details Patient Name: Date of Service: Vernon Frye, Vernon Frye 11/01/2019 3:30 PM Medical Record [id number] Patient Account Number: [id number] Date of Birth/Sex: Treating RN: 11-May-1961 (58 y.o. [name]) [name] Primary Care Gabriella Guile: Nicholes Rough Other Clinician: Referring Madeliene Tejera: Treating Kristiann Noyce/Extender:Robson, Lorn Junes, Janene Harvey in Treatment: 2 Visit Information History Since Last Visit Added or deleted any medications: No Patient Arrived: Wheel Chair Any new allergies or adverse reactions: No Arrival Time: 16:23 Had a fall or experienced change in Yes activities of daily living that may affect Accompanied By: self risk of falls: Transfer Assistance: None Signs or symptoms of abuse/neglect since last No Patient Identification Verified: Yes visito Secondary Verification Process Completed: Yes Hospitalized since last visit: No Patient Requires Transmission-Based No Implantable device outside of the clinic excluding No Precautions: cellular tissue based products placed in the center Patient Has Alerts: No since last visit: Has Dressing in Place as Prescribed: Yes Pain Present Now: Yes Electronic Signature(s) Signed: 11/09/2019 9:20:10 AM By: Sandre Kitty Entered By: Sandre Kitty on 11/01/2019 16:24:06 -------------------------------------------------------------------------------- Clinic Level of Care Assessment Details Patient Name: Date of Service: Vernon Frye, Vernon Frye 11/01/2019 3:30 PM Medical Record Number:[id number] Patient Account Number: [id number] Date of Birth/Sex: Treating RN: 07-16-1961 (58 y.o. [name]) [name] Primary Care Brianne Maina: Nicholes Rough Other Clinician: Referring Jaron Czarnecki: Treating Devone Tousley/Extender:Robson, Lorn Junes, Janene Harvey in Treatment: 2 Clinic Level of Care Assessment Items TOOL 4 Quantity Score X - Use when only an EandM is performed on FOLLOW-UP  visit 1 0 ASSESSMENTS - Nursing Assessment / Reassessment X - Reassessment of Co-morbidities (includes updates in patient status) 1 10 X - Reassessment of Adherence to Treatment Plan 1 5 ASSESSMENTS - Wound and Skin Assessment / Reassessment X - Simple Wound Assessment / Reassessment - one wound 1 5 []  - Complex Wound Assessment / Reassessment - multiple wounds 0 []  - Dermatologic / Skin Assessment (not related to wound area) 0 ASSESSMENTS - Focused Assessment []  - Circumferential Edema Measurements - multi extremities 0 []  - Nutritional Assessment / Counseling / Intervention 0 []  - Lower Extremity Assessment (monofilament, tuning fork, pulses) 0 []  - Peripheral Arterial Disease Assessment (using hand held doppler) 0 ASSESSMENTS - Ostomy and/or Continence Assessment and Care []  - Incontinence Assessment and Management 0 []  - Ostomy Care Assessment and Management (repouching, etc.) 0 PROCESS - Coordination of Care X - Simple Patient / Family Education for ongoing care 1 15 []  - Complex (extensive) Patient / Family Education for ongoing care 0 X - Staff obtains Consents, Records, Test Results / Process Orders 1 10 []  - Staff telephones HHA, Nursing Homes / Clarify orders / etc 0 []  - Routine Transfer to another Facility (non-emergent condition) 0 []  - Routine Hospital Admission (non-emergent condition) 0 []  - New Admissions / Biomedical engineer / Ordering NPWT, Apligraf, etc. 0 []  - Emergency Hospital Admission (emergent condition) 0 X - Simple Discharge Coordination 1 10 []  - Complex (extensive) Discharge Coordination 0 PROCESS - Special Needs []  - Pediatric / Minor Patient Management 0 []  - Isolation Patient Management 0 []  - Hearing / Language / Visual special needs 0 []  - Assessment of Community assistance (transportation, D/C planning, etc.) 0 []  - Additional assistance / Altered mentation 0 []  - Support Surface(s) Assessment (bed, cushion, seat, etc.) 0 INTERVENTIONS -  Wound Cleansing / Measurement []  - Simple Wound Cleansing - one wound 0 X - Complex Wound Cleansing - multiple wounds 2 5 X - Wound Imaging (photographs -  any number of wounds) 1 5 []  - Wound Tracing (instead of photographs) 0 []  - Simple Wound Measurement - one wound 0 X - Complex Wound Measurement - multiple wounds 2 5 INTERVENTIONS - Wound Dressings []  - Small Wound Dressing one or multiple wounds 0 X - Medium Wound Dressing one or multiple wounds 1 15 []  - Large Wound Dressing one or multiple wounds 0 X - Application of Medications - topical 1 5 []  - Application of Medications - injection 0 INTERVENTIONS - Miscellaneous []  - External ear exam 0 []  - Specimen Collection (cultures, biopsies, blood, body fluids, etc.) 0 []  - Specimen(s) / Culture(s) sent or taken to Lab for analysis 0 []  - Patient Transfer (multiple staff / Civil Service fast streamer / Similar devices) 0 []  - Simple Staple / Suture removal (25 or less) 0 []  - Complex Staple / Suture removal (26 or more) 0 []  - Hypo / Hyperglycemic Management (close monitor of Blood Glucose) 0 []  - Ankle / Brachial Index (ABI) - do not check if billed separately 0 X - Vital Signs 1 5 Has the patient been seen at the hospital within the last three years: Yes Total Score: 105 Level Of Care: New/Established - Level 3 Electronic Signature(s) Signed: 11/01/2019 6:43:44 PM By: [name] RN Entered By: [name] on 11/01/2019 17:23:55 -------------------------------------------------------------------------------- Encounter Discharge Information Details Patient Name: Date of Service: Vernon Frye 11/01/2019 3:30 PM Medical Record [id number] Patient Account Number: [id number] Date of Birth/Sex: Treating RN: 17-Nov-1960 (59 y.o. Marvis Repress Primary Care Leatrice Parilla: Nicholes Rough Other Clinician: Referring Ellenor Wisniewski: Treating Alithea Lapage/Extender:Robson, Lorn Junes, Janene Harvey in Treatment: 2 Encounter Discharge Information  Items Discharge Condition: Stable Ambulatory Status: Wheelchair Discharge Destination: Home Transportation: Other Accompanied By: social worker Schedule Follow-up Appointment: Yes Clinical Summary of Care: Patient Declined Electronic Signature(s) Signed: 11/01/2019 6:07:04 PM By: Kela Millin Entered By: Kela Millin on 11/01/2019 18:03:00 -------------------------------------------------------------------------------- Lower Extremity Assessment Details Patient Name: Date of Service: Vernon Frye, Vernon Frye 11/01/2019 3:30 PM Medical Record [id number] Patient Account Number: [id number] Date of Birth/Sex: Treating RN: 1960-12-22 (59 y.o. Ernestene Mention Primary Care Arvine Clayburn: Nicholes Rough Other Clinician: Referring Garnett Nunziata: Treating Joana Nolton/Extender:Robson, Lorn Junes, Janene Harvey in Treatment: 2 Edema Assessment Assessed: [Left: No] [Right: No] Edema: [Left: Ye] [Right: s] Calf Left: Right: Point of Measurement: cm From Medial Instep 40 cm cm Ankle Left: Right: Point of Measurement: cm From Medial Instep 27.4 cm cm Vascular Assessment Pulses: Dorsalis Pedis Palpable: [Left:No] Electronic Signature(s) Signed: 11/01/2019 6:21:28 PM By: Baruch Gouty RN, BSN Entered By: Baruch Gouty on 11/01/2019 16:41:57 -------------------------------------------------------------------------------- Multi Wound Chart Details Patient Name: Date of Service: Vernon Frye 11/01/2019 3:30 PM Medical Record [id number] Patient Account Number: [id number] Date of Birth/Sex: Treating RN: 02-01-1961 (58 y.o. [name] Primary Care Jamill Wetmore: Nicholes Rough Other Clinician: Referring Patricia Perales: Treating Hudsen Fei/Extender:Robson, Lorn Junes, Janene Harvey in Treatment: 2 Vital Signs Height(in): 75 Pulse(bpm): 77 Weight(lbs): 240 Blood Pressure(mmHg): 144/89 Body Mass Index(BMI): 30 Temperature(F): 98.3 Respiratory 18 Rate(breaths/min): Photos: [1:No Photos]  [2:No Photos] [N/A:N/A] Wound Location: [1:Left Calcaneus] [2:Left Toe Fifth] [N/A:N/A] Wounding Event: [1:Pressure Injury] [2:Pressure Injury] [N/A:N/A] Primary Etiology: [1:Pressure Ulcer] [2:Arterial Insufficiency Ulcer] [N/A:N/A] Comorbid History: [1:Sleep Apnea, Arrhythmia, Hypertension] [2:Sleep Apnea, Arrhythmia, Hypertension] [N/A:N/A] Date Acquired: [1:07/17/2019] [2:07/17/2019] [N/A:N/A] Weeks of Treatment: [1:2] [2:2] [N/A:N/A] Wound Status: [1:Open] [2:Open] [N/A:N/A] Measurements L x W x D 4.7x4.4x0.1 [2:0.6x0.4x0.1] [N/A:N/A] (cm) Area (cm) : [1:16.242] [2:0.188] [N/A:N/A] Volume (cm) : [1:1.624] [2:0.019] [N/A:N/A] % Reduction in Area: [1:-6.90%] [2:84.70%] [N/A:N/A] % Reduction  in Volume: -6.80% [2:84.60%] [N/A:N/A] Classification: [1:Unstageable/Unclassified] [2:Full Thickness Without Exposed Support Structures] [N/A:N/A] Exudate Amount: [1:None Present] [2:Small] [N/A:N/A] Exudate Type: [1:N/A] [2:Serosanguineous] [N/A:N/A] Exudate Color: [1:N/A] [2:red, brown] [N/A:N/A] Wound Margin: [1:Flat and Intact] [2:Flat and Intact] [N/A:N/A] Granulation Amount: [1:None Present (0%)] [2:Large (67-100%)] [N/A:N/A] Granulation Quality: [1:N/A] [2:Red] [N/A:N/A] Necrotic Amount: [1:Large (67-100%)] [2:None Present (0%)] [N/A:N/A] Necrotic Tissue: [1:Eschar] [2:N/A] [N/A:N/A] Exposed Structures: [1:Fascia: No Fat Layer (Subcutaneous Tissue) Exposed: No Tendon: No Muscle: No Joint: No Bone: No Small (1-33%)] [2:Fat Layer (Subcutaneous N/A Tissue) Exposed: Yes Fascia: No Tendon: No Muscle: No Joint: No Bone: No Small (1-33%)] [N/A:N/A] Treatment Notes Electronic Signature(s) Signed: 11/01/2019 6:13:52 PM By: [name] MD Signed: 11/01/2019 6:43:44 PM By: [name] RN Entered By: [name] on 11/01/2019 17:33:04 -------------------------------------------------------------------------------- Multi-Disciplinary Care Plan Details Patient Name: Date of Service: Vernon Frye. 11/01/2019 3:30 PM Medical Record [id number] Patient Account Number: [id number] Date of Birth/Sex: Treating RN: May 29, 1961 (58 y.o. [name] Primary Care Masson Nalepa: Nicholes Rough Other Clinician: Referring Yecheskel Kurek: Treating Plumer Mittelstaedt/Extender:Robson, Lorn Junes, Janene Harvey in Treatment: 2 Active Inactive Wound/Skin Impairment Nursing Diagnoses: Knowledge deficit related to ulceration/compromised skin integrity Goals: Patient/caregiver will verbalize understanding of skin care regimen Date Initiated: 10/18/2019 Target Resolution Date: 11/18/2019 Goal Status: Active Ulcer/skin breakdown will have a volume reduction of 30% by week 4 Date Initiated: 10/18/2019 Target Resolution Date: 11/18/2019 Goal Status: Active Interventions: Assess patient/caregiver ability to obtain necessary supplies Assess patient/caregiver ability to perform ulcer/skin care regimen upon admission and as needed Assess ulceration(s) every visit Notes: Electronic Signature(s) Signed: 11/01/2019 6:43:44 PM By: [name] RN Entered By: [name] on 11/01/2019 16:04:02 -------------------------------------------------------------------------------- Pain Assessment Details Patient Name: Date of Service: Vernon Frye, Vernon Frye 11/01/2019 3:30 PM Medical Record [id number] Patient Account Number: [id number] Date of Birth/Sex: Treating RN: [birth year] (58 y.o. [name] Primary Care Mikyah Alamo: Nicholes Rough Other Clinician: Referring Jadi Deyarmin: Treating Casen Pryor/Extender:Robson, Lorn Junes, Janene Harvey in Treatment: 2 Active Problems Location of Pain Severity and Description of Pain Patient Has Paino Yes Site Locations Rate the pain. Current Pain Level: 10 Pain Management and Medication Current Pain Management: Electronic Signature(s) Signed: 11/01/2019 6:43:44 PM By: [name] RN Signed: 11/09/2019 9:20:10 AM By: Sandre Kitty Entered By: Sandre Kitty on 11/01/2019  16:23:50 -------------------------------------------------------------------------------- Patient/Caregiver Education Details Patient Name: Date of Service: Vernon Frye 4/6/2021andnbsp3:30 PM Medical Record [id number] Patient Account Number: [id number] Date of Birth/Gender: 23-Dec-1960 (58 y.o. M) Treating RN: [name] Primary Care Physician: Nicholes Rough Other Clinician: Referring Physician: Treating Physician/Extender:Robson, Lorn Junes, Janene Harvey in Treatment: 2 Education Assessment Education Provided To: Patient Education Topics Provided Wound/Skin Impairment: Methods: Explain/Verbal Responses: State content correctly Electronic Signature(s) Signed: 11/01/2019 6:43:44 PM By: [name] RN Entered By: [name] on 11/01/2019 16:04:19 -------------------------------------------------------------------------------- Wound Assessment Details Patient Name: Date of Service: Vernon Frye, Vernon Frye 11/01/2019 3:30 PM Medical Record [id number] Patient Account Number: [id number] Date of Birth/Sex: Treating RN: 1961/06/15 (59 y.o. Ernestene Mention Primary Care Eleny Cortez: Nicholes Rough Other Clinician: Referring Jheri Mitter: Treating Kylee Umana/Extender:Robson, Lorn Junes, Janene Harvey in Treatment: 2 Wound Status Wound Number: 1 Primary Etiology: Pressure Ulcer Wound Location: Left Calcaneus Wound Status: Open Wounding Event: Pressure Injury Comorbid Sleep Apnea, Arrhythmia, History: Hypertension Date Acquired: 07/17/2019 Weeks Of Treatment: 2 Clustered Wound: No Wound Measurements Length: (cm) 4.7 % Reduct Width: (cm) 4.4 % Reduct Depth: (cm) 0.1 Epitheli Area: (cm) 16.242 Tunneli Volume: (cm) 1.624 Undermi Wound Description Classification: Unstageable/Unclassified Foul Od Wound Margin: Flat and Intact Slough/ Exudate Amount: None Present  Wound Bed Granulation Amount: None Present (0%) Necrotic Amount: Large (67-100%) Fascia E Necrotic Quality:  Eschar Fat Laye Tendon E Muscle E Joint Ex Bone Exp Electronic Signature(s) Signed: 11/01/2019 6:21:28 PM By: Baruch Gouty RN, BSN Entered By: Baruch Gouty on 04/0 or After Cleansing: No Fibrino No Exposed Structure xposed: No r (Subcutaneous Tissue) Exposed: No xposed: No xposed: No posed: No osed: No 12/2019 16:42:29 ion in Area: -6.9% ion in Volume: -6.8% alization: Small (1-33%) ng: No ning: No -------------------------------------------------------------------------------- Wound Assessment Details Patient Name: Date of Service: Vernon Frye, Vernon Frye 11/01/2019 3:30 PM Medical Record [id number] Patient Account Number: [id number] Date of Birth/Sex: Treating RN: 12/02/1960 (59 y.o. Ernestene Mention Primary Care Sukhman Martine: Nicholes Rough Other Clinician: Referring Khalil Belote: Treating Latonyia Lopata/Extender:Robson, Lorn Junes, Janene Harvey in Treatment: 2 Wound Status Wound Number: 2 Primary Etiology: Arterial Insufficiency Ulcer Wound Location: Left Toe Fifth Wound Status: Open Wounding Event: Pressure Injury Comorbid Sleep Apnea, Arrhythmia, History: Hypertension Date Acquired: 07/17/2019 Weeks Of Treatment: 2 Clustered Wound: No Wound Measurements Length: (cm) 0.6 % Reduct Width: (cm) 0.4 % Reduct Depth: (cm) 0.1 Epitheli Area: (cm) 0.188 Tunneli Volume: (cm) 0.019 Undermi Wound Description Classification: Full Thickness Without Exposed Support Foul Od Structures Slough/ Wound Flat and Intact Margin: Exudate Small Amount: Exudate Serosanguineous Type: Exudate red, brown Color: Wound Bed Granulation Amount: Large (67-100%) Granulation Quality: Red Fascia Necrotic Amount: None Present (0%) Fat Lay Tendon Muscle Joint E Bone Ex or After Cleansing: No Fibrino Yes Exposed Structure Exposed: No er (Subcutaneous Tissue) Exposed: Yes Exposed: No Exposed: No xposed: No posed: No ion in Area: 84.7% ion in Volume: 84.6% alization: Small  (1-33%) ng: No ning: No Electronic Signature(s) Signed: 11/01/2019 6:21:28 PM By: Baruch Gouty RN, BSN Entered By: Baruch Gouty on 11/01/2019 16:42:46 -------------------------------------------------------------------------------- [place] Details Patient Name: Date of Service: Vernon Frye 11/01/2019 3:30 PM Medical Record [id number] Patient Account Number: [id number] Date of Birth/Sex: Treating RN: 05-31-1961 (58 y.o. [name]) [name] Primary Care Chassie Pennix: Nicholes Rough Other Clinician: Referring Angeliyah Kirkey: Treating Valery Chance/Extender:Robson, Lorn Junes, Janene Harvey in Treatment: 2 Vital Signs Time Taken: 16:26 Temperature (F): 98.3 Height (in): 75 Pulse (bpm): 62 Weight (lbs): 240 Respiratory Rate (breaths/min): 18 Body Mass Index (BMI): 30 Blood Pressure (mmHg): 144/89 Reference Range: 80 - 120 mg / dl Electronic Signature(s) Signed: 11/09/2019 9:20:10 AM By: Sandre Kitty Entered By: Sandre Kitty on 11/01/2019 16:28:14

## 2019-11-11 NOTE — Telephone Encounter (Signed)
Per Dr. Linus Salmons called patient's home health agency to d/c antibiotics and pull picc. Spoke with Cloyd Stagers with [facility] who will relay orders to pharmacy team., Patient and Case Manager was informed. [place]

## 2019-11-15 ENCOUNTER — Other Ambulatory Visit: Payer: Self-pay

## 2019-11-15 ENCOUNTER — Encounter (HOSPITAL_BASED_OUTPATIENT_CLINIC_OR_DEPARTMENT_OTHER): Payer: Self-pay | Admitting: Internal Medicine
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
## 2019-11-22 ENCOUNTER — Encounter (HOSPITAL_BASED_OUTPATIENT_CLINIC_OR_DEPARTMENT_OTHER): Payer: Self-pay | Admitting: Internal Medicine

## 2019-11-22 ENCOUNTER — Other Ambulatory Visit: Payer: Self-pay
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
## 2019-11-28 ENCOUNTER — Encounter (HOSPITAL_BASED_OUTPATIENT_CLINIC_OR_DEPARTMENT_OTHER): Payer: Self-pay | Attending: Internal Medicine | Admitting: Internal Medicine

## 2019-11-28 DIAGNOSIS — Z96652 Presence of left artificial knee joint: Secondary | ICD-10-CM | POA: Insufficient documentation

## 2019-11-28 DIAGNOSIS — I10 Essential (primary) hypertension: Secondary | ICD-10-CM | POA: Insufficient documentation

## 2019-11-28 DIAGNOSIS — L89620 Pressure ulcer of left heel, unstageable: Secondary | ICD-10-CM | POA: Insufficient documentation

## 2019-11-28 DIAGNOSIS — Z8614 Personal history of Methicillin resistant Staphylococcus aureus infection: Secondary | ICD-10-CM | POA: Insufficient documentation

## 2019-11-28 NOTE — Progress Notes (Signed)
MICCO, GOODFELLOW ([id number]) Visit Report for 11/28/2019 Arrival Information Details Patient Name: Date of Service: Vernon Frye, Vernon Frye 11/28/2019 10:15 A M Medical Record Number: [id number] Patient Account Number: [id number] Date of Birth/Sex: Treating RN: 01-06-1961 (58 y.o. Vernon Frye) Vernon Frye, Vernon Frye Primary Care Vernon Frye: Vernon Frye Other Clinician: Referring Talley Kreiser: Treating Vernon Frye/Extender: Vernon Frye in Treatment: 5 Visit Information History Since Last Visit All ordered tests and consults were completed: No Patient Arrived: Wheel Chair Added or deleted any medications: No Arrival Time: 10:39 Any new allergies or adverse reactions: No Accompanied By: self Had a fall or experienced change in No Transfer Assistance: None activities of daily living that may affect Patient Identification Verified: Yes risk of falls: Secondary Verification Process Completed: Yes Signs or symptoms of abuse/neglect since last visito No Patient Requires Transmission-Based Precautions: No Hospitalized since last visit: No Patient Has Alerts: No Implantable device outside of the clinic excluding No cellular tissue based products placed in the center since last visit: Has Dressing in Place as Prescribed: Yes Pain Present Now: Yes Electronic Signature(s) Signed: 11/28/2019 5:02:18 PM By: Vernon Coria RN Entered By: Vernon Frye on 11/28/2019 10:39:56 -------------------------------------------------------------------------------- Encounter Discharge Information Details Patient Name: Date of Service: Vernon Frye, Vernon L. 11/28/2019 10:15 A M Medical Record Number: [id number] Patient Account Number: [id number] Date of Birth/Sex: Treating RN: Mar 14, 1961 (59 y.o. Vernon Frye Primary Care Vernon Frye: Vernon Frye Other Clinician: Referring Vernon Frye: Treating Vernon Frye/Extender: Vernon Frye in Treatment: 5 Encounter Discharge Information Items Discharge Condition:  Stable Ambulatory Status: Wheelchair Discharge Destination: Home Transportation: Private Auto Accompanied By: self Schedule Follow-up Appointment: Yes Clinical Summary of Care: Electronic Signature(s) Signed: 11/28/2019 4:26:09 PM By: Vernon Frye Entered By: Vernon Frye on 11/28/2019 11:23:03 -------------------------------------------------------------------------------- Lower Extremity Assessment Details Patient Name: Date of Service: Vernon Frye, [name] 11/28/2019 10:15 A M Medical Record Number: [id number] Patient Account Number: [id number] Date of Birth/Sex: Treating RN: 1960/11/11 (58 y.o. Vernon Frye Primary Care Alyn Riedinger: Vernon Frye Other Clinician: Referring Vernon Frye: Treating Vernon Frye/Extender: Vernon Frye Weeks in Treatment: 5 Edema Assessment Assessed: Vernon Frye: No] [Right: No] Edema: [Left: Ye] [Right: s] Calf Left: Right: Point of Measurement: cm From Medial Instep 42 cm cm Ankle Left: Right: Point of Measurement: cm From Medial Instep 31 cm cm Electronic Signature(s) Signed: 11/28/2019 5:02:18 PM By: Vernon Coria RN Entered By: Vernon Frye on 11/28/2019 10:41:15 -------------------------------------------------------------------------------- Multi Wound Chart Details Patient Name: Date of Service: Vernon Frye, Vernon L. 11/28/2019 10:15 A M Medical Record Number: [id number] Patient Account Number: [id number] Date of Birth/Sex: Treating RN: 12/19/1960 (59 y.o. Vernon Frye Primary Care Makaylynn Bonillas: Vernon Frye Other Clinician: Referring Vernon Frye: Treating Vernon Frye/Extender: Vernon Frye in Treatment: 5 Vital Signs Height(in): 75 Pulse(bpm): 63 Weight(lbs): 240 Blood Pressure(mmHg): 137/65 Body Mass Index(BMI): 30 Temperature(F): 98.2 Respiratory Rate(breaths/min): 18 Photos: [1:No Photos Left Calcaneus] [Frye:Frye Frye] Wound Location: [1:Pressure Injury] [Frye:Frye] Wounding Event: [1:Pressure Ulcer]  [Frye:Frye] Primary Etiology: [1:Sleep Apnea, Arrhythmia,] [Frye:Frye] Comorbid History: [1:Hypertension 07/17/2019] [Frye:Frye] Date Acquired: [1:5] [Frye:Frye] Weeks of Treatment: [1:Open] [Frye:Frye] Wound Status: [1:4x5x0.1] [Frye:Frye] Measurements L x W x D (cm) [1:15.708] [Frye:Frye] A (cm) : rea [1:1.571] [Frye:Frye] Volume (cm) : [1:-3.40%] [Frye:Frye] % Reduction in A rea: [1:-3.40%] [Frye:Frye] % Reduction in Volume: [1:Unstageable/Unclassified] [Frye:Frye] Classification: [1:Medium] [Frye:Frye] Exudate A mount: [1:Serosanguineous] [Frye:Frye] Exudate Type: [1:red, brown] [Frye:Frye] Exudate Color: [1:Flat and Intact] [Frye:Frye] Wound Margin: [1:None Present (0%)] [Frye:Frye] Granulation Amount: [1:Large (67-100%)] [Frye:Frye] Necrotic Amount: [1:Eschar] [Frye:Frye] Necrotic  Tissue: [1:Fascia: No] [Frye:Frye] Exposed Structures: [1:Fat Layer (Subcutaneous Tissue) Exposed: No Tendon: No Muscle: No Joint: No Bone: No Small (1-33%)] [Frye:Frye] Treatment Notes Electronic Signature(s) Signed: 11/28/2019 5:27:06 PM By: Vernon Hurst RN, BSN Signed: 11/28/2019 5:33:20 PM By: Vernon Ham MD Entered By: Vernon Frye on 11/28/2019 11:13:32 -------------------------------------------------------------------------------- [place] Details Patient Name: Date of Service: Vernon Frye, Vernon L. 11/28/2019 10:15 A M Medical Record Number: [id number] Patient Account Number: [id number] Date of Birth/Sex: Treating RN: 12-12-1960 (59 y.o. Vernon Frye Primary Care Anaissa Macfadden: Vernon Frye Other Clinician: Referring Jeson Camacho: Treating Journee Kohen/Extender: Vernon Frye in Treatment: 5 Active Inactive Wound/Skin Impairment Nursing Diagnoses: Knowledge deficit related to ulceration/compromised skin integrity Goals: Patient/caregiver will verbalize understanding of skin care regimen Date Initiated: 10/18/2019 Target Resolution Date: 12/16/2019 Goal Status: Active Ulcer/skin  breakdown will have a volume reduction of 30% by week 4 Date Initiated: 10/18/2019 Date Inactivated: 11/22/2019 Target Resolution Date: 11/18/2019 Goal Status: Unmet Unmet Reason: comorbities Ulcer/skin breakdown will have a volume reduction of 50% by week 8 Date Initiated: 11/22/2019 Target Resolution Date: 12/16/2019 Goal Status: Active Interventions: Assess patient/caregiver ability to obtain necessary supplies Assess patient/caregiver ability to perform ulcer/skin care regimen upon admission and as needed Assess ulceration(s) every visit Notes: Electronic Signature(s) Signed: 11/28/2019 5:27:06 PM By: Vernon Hurst RN, BSN Entered By: Vernon Frye on 11/28/2019 14:01:26 -------------------------------------------------------------------------------- Pain Assessment Details Patient Name: Date of Service: Vernon Frye, [place]. 11/28/2019 10:15 A M Medical Record Number: [id number] Patient Account Number: [id number] Date of Birth/Sex: Treating RN: 1960/09/16 (58 y.o. Vernon Frye Primary Care Leisa Gault: Vernon Frye Other Clinician: Referring Yumiko Alkins: Treating Keneth Borg/Extender: Vernon Frye in Treatment: 5 Active Problems Location of Pain Severity and Description of Pain Patient Has Paino Yes Site Locations With Dressing Change: Yes Duration of the Pain. Constant / Intermittento Constant Rate the pain. Current Pain Level: 9 Worst Pain Level: 10 Least Pain Level: 7 Tolerable Pain Level: 5 Character of Pain Describe the Pain: Aching, Shooting Pain Management and Medication Current Pain Management: Medication: Yes Cold Application: No Rest: Yes Massage: No Activity: No T.E.N.S.: No Heat Application: No Leg drop or elevation: No Is the Current Pain Management Adequate: Inadequate How does your wound impact your activities of daily livingo Sleep: Yes Bathing: No Appetite: No Relationship With Others: No Bladder Continence: No Emotions:  No Bowel Continence: No Work: No Toileting: No Drive: No Dressing: No Hobbies: No Electronic Signature(s) Signed: 11/28/2019 5:02:18 PM By: Vernon Coria RN Entered By: Vernon Frye on 11/28/2019 10:41:11 -------------------------------------------------------------------------------- Patient/Caregiver Education Details Patient Name: Date of Service: Vernon Frye, Vernon L. 5/3/2021andnbsp10:15 A M Medical Record Number: [id number] Patient Account Number: [id number] Date of Birth/Gender: Treating RN: 1961-01-14 (59 y.o. Vernon Frye Primary Care Physician: Vernon Frye Other Clinician: Referring Physician: Treating Physician/Extender: Vernon Frye in Treatment: 5 Education Assessment Education Provided To: Patient Education Topics Provided Wound/Skin Impairment: Methods: Explain/Verbal Responses: State content correctly Electronic Signature(s) Signed: 11/28/2019 5:27:06 PM By: Vernon Hurst RN, BSN Entered By: Vernon Frye on 11/28/2019 14:01:36 -------------------------------------------------------------------------------- Wound Assessment Details Patient Name: Date of Service: Vernon Frye, Vernon Frye 11/28/2019 10:15 A M Medical Record Number: [id number] Patient Account Number: [id number] Date of Birth/Sex: Treating RN: [birth year] (58 y.o. Vernon Frye Primary Care Gracious Renken: Vernon Frye Other Clinician: Referring Livingston Denner: Treating Meili Kleckley/Extender: Vernon Frye Weeks in Treatment: 5 Wound Status Wound Number: 1 Primary Etiology: Pressure Ulcer Wound Location: Left Calcaneus Wound Status: Open Wounding  Event: Pressure Injury Comorbid History: Sleep Apnea, Arrhythmia, Hypertension Date Acquired: 07/17/2019 Weeks Of Treatment: 5 Clustered Wound: No Wound Measurements Length: (cm) 4 Width: (cm) 5 Depth: (cm) 0.1 Area: (cm) 15.708 Volume: (cm) 1.571 % Reduction in Area: -3.4% % Reduction in Volume:  -3.4% Epithelialization: Small (1-33%) Tunneling: No Undermining: No Wound Description Classification: Unstageable/Unclassified Wound Margin: Flat and Intact Exudate Amount: Medium Exudate Type: Serosanguineous Exudate Color: red, brown Foul Odor After Cleansing: No Slough/Fibrino Yes Wound Bed Granulation Amount: None Present (0%) Exposed Structure Necrotic Amount: Large (67-100%) Fascia Exposed: No Necrotic Quality: Eschar Fat Layer (Subcutaneous Tissue) Exposed: No Tendon Exposed: No Muscle Exposed: No Joint Exposed: No Bone Exposed: No Treatment Notes Wound #1 (Left Calcaneus) 1. Cleanse With Wound Cleanser 3. Primary Dressing Applied Santyl 4. Secondary Dressing Dry Gauze Roll Gauze Heel Cup 5. Secured With Medipore tape Notes moistened saline gauze Electronic Signature(s) Signed: 11/28/2019 5:02:18 PM By: Vernon Coria RN Entered By: Vernon Frye on 11/28/2019 10:41:50 -------------------------------------------------------------------------------- Vitals Details Patient Name: Date of Service: Vernon Frye, Vernon Frye 11/28/2019 10:15 A M Medical Record Number: [id number] Patient Account Number: [id number] Date of Birth/Sex: Treating RN: September 10, 1960 (58 y.o. Vernon Frye) Vernon Frye Primary Care Hosey Burmester: Vernon Frye Other Clinician: Referring Brienne Liguori: Treating Dashawn Golda/Extender: Vernon Frye in Treatment: 5 Vital Signs Time Taken: 10:40 Temperature (F): 98.2 Height (in): 75 Pulse (bpm): 63 Weight (lbs): 240 Respiratory Rate (breaths/min): 18 Body Mass Index (BMI): 30 Blood Pressure (mmHg): 137/65 Reference Range: 80 - 120 mg / dl Electronic Signature(s) Signed: 11/28/2019 5:02:18 PM By: Vernon Coria RN Entered By: Vernon Frye on 11/28/2019 10:40:30
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
## 2019-12-06 ENCOUNTER — Encounter (HOSPITAL_BASED_OUTPATIENT_CLINIC_OR_DEPARTMENT_OTHER): Payer: Self-pay | Attending: Internal Medicine | Admitting: Internal Medicine

## 2019-12-06 DIAGNOSIS — I70245 Atherosclerosis of native arteries of left leg with ulceration of other part of foot: Secondary | ICD-10-CM | POA: Insufficient documentation

## 2019-12-06 DIAGNOSIS — L97521 Non-pressure chronic ulcer of other part of left foot limited to breakdown of skin: Secondary | ICD-10-CM | POA: Insufficient documentation

## 2019-12-06 DIAGNOSIS — Z96652 Presence of left artificial knee joint: Secondary | ICD-10-CM | POA: Insufficient documentation

## 2019-12-06 DIAGNOSIS — A4102 Sepsis due to Methicillin resistant Staphylococcus aureus: Secondary | ICD-10-CM | POA: Insufficient documentation

## 2019-12-06 DIAGNOSIS — I4891 Unspecified atrial fibrillation: Secondary | ICD-10-CM | POA: Insufficient documentation

## 2019-12-06 DIAGNOSIS — Z6830 Body mass index (BMI) 30.0-30.9, adult: Secondary | ICD-10-CM | POA: Insufficient documentation

## 2019-12-06 DIAGNOSIS — I10 Essential (primary) hypertension: Secondary | ICD-10-CM | POA: Insufficient documentation

## 2019-12-06 DIAGNOSIS — L89620 Pressure ulcer of left heel, unstageable: Secondary | ICD-10-CM | POA: Insufficient documentation
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
## 2019-12-07 NOTE — Progress Notes (Signed)
WYITT, SUDLER ([id number]) Visit Report for 11/15/2019 Arrival Information Details Patient Name: Date of Service: Vernon Frye, [place]. 11/15/2019 3:30 PM Medical Record Number: [id number] Patient Account Number: [id number] Date of Birth/Sex: Treating RN: 14-Mar-1961 (58 y.o. [name]) [name] Primary Care Sandria Mcenroe: Nicholes Rough Other Clinician: Referring Bradie Sangiovanni: Treating Juri Dinning/Extender: Anselm Pancoast in Treatment: 4 Visit Information History Since Last Visit Added or deleted any medications: No Patient Arrived: Wheel Chair Any new allergies or adverse reactions: No Arrival Time: 16:36 Had a fall or experienced change in No Accompanied By: case manager activities of daily living that may affect Transfer Assistance: None risk of falls: Patient Identification Verified: Yes Signs or symptoms of abuse/neglect since last visito No Secondary Verification Process Completed: Yes Hospitalized since last visit: No Patient Requires Transmission-Based Precautions: No Implantable device outside of the clinic excluding No Patient Has Alerts: No cellular tissue based products placed in the center since last visit: Has Dressing in Place as Prescribed: Yes Pain Present Now: Yes Electronic Signature(s) Signed: 12/07/2019 9:10:01 AM By: Sandre Kitty Entered By: Sandre Kitty on 11/15/2019 16:36:35 -------------------------------------------------------------------------------- Encounter Discharge Information Details Patient Name: Date of Service: Vernon Frye, Liston L. 11/15/2019 3:30 PM Medical Record Number: [id number] Patient Account Number: [id number] Date of Birth/Sex: Treating RN: 1960/08/20 (59 y.o. Ernestene Mention Primary Care Nimrat Woolworth: Nicholes Rough Other Clinician: Referring Seraj Dunnam: Treating Britian Jentz/Extender: Anselm Pancoast in Treatment: 4 Encounter Discharge Information Items Post Procedure Vitals Discharge Condition:  Stable Temperature (F): 98.2 Ambulatory Status: Wheelchair Pulse (bpm): 50 Discharge Destination: Home Respiratory Rate (breaths/min): 18 Transportation: Other Blood Pressure (mmHg): 118/66 Accompanied By: case worker Schedule Follow-up Appointment: Yes Clinical Summary of Care: Patient Declined Electronic Signature(s) Signed: 11/15/2019 6:13:22 PM By: Baruch Gouty RN, BSN Entered By: Baruch Gouty on 11/15/2019 17:42:54 -------------------------------------------------------------------------------- Lower Extremity Assessment Details Patient Name: Date of Service: Vernon Frye, Zamere L. 11/15/2019 3:30 PM Medical Record Number: [id number] Patient Account Number: [id number] Date of Birth/Sex: Treating RN: Feb 03, 1961 (59 y.o. [name] Primary Care Kindred Heying: Nicholes Rough Other Clinician: Referring Vance Hochmuth: Treating Saatvik Thielman/Extender: Anselm Pancoast in Treatment: 4 Edema Assessment Assessed: Shirlyn Goltz: No] Patrice Paradise: No] Edema: [Left: Ye] [Right: s] Calf Left: Right: Point of Measurement: cm From Medial Instep 41 cm cm Ankle Left: Right: Point of Measurement: cm From Medial Instep 26 cm cm Vascular Assessment Pulses: Dorsalis Pedis Palpable: [Left:Yes] Electronic Signature(s) Signed: 11/15/2019 6:13:01 PM By: Levan Hurst RN, BSN Entered By: Levan Hurst on 11/15/2019 16:48:04 -------------------------------------------------------------------------------- Multi Wound Chart Details Patient Name: Date of Service: Vernon Frye, Orvis L. 11/15/2019 3:30 PM Medical Record Number: [id number] Patient Account Number: [id number] Date of Birth/Sex: Treating RN: Dec 17, 1960 (58 y.o. [name]) [name] Primary Care Mckensi Redinger: Nicholes Rough Other Clinician: Referring Cedrica Brune: Treating Claus Silvestro/Extender: Anselm Pancoast in Treatment: 4 Vital Signs Height(in): 75 Pulse(bpm): 50 Weight(lbs): 240 Blood Pressure(mmHg): 118/66 Body Mass  Index(BMI): 30 Temperature(F): 98.2 Respiratory Rate(breaths/min): 19 Photos: [1:No Photos Left Calcaneus] [2:No Photos Left T Fifth oe] [N/A:N/A N/A] Wound Location: [1:Pressure Injury] [2:Pressure Injury] [N/A:N/A] Wounding Event: [1:Pressure Ulcer] [2:Arterial Insufficiency Ulcer] [N/A:N/A] Primary Etiology: [1:Sleep Apnea, Arrhythmia,] [2:Sleep Apnea, Arrhythmia,] [N/A:N/A] Comorbid History: [1:Hypertension 07/17/2019] [2:Hypertension 07/17/2019] [N/A:N/A] Date Acquired: [1:4] [2:4] [N/A:N/A] Weeks of Treatment: [1:Open] [2:Open] [N/A:N/A] Wound Status: [1:4.5x4.5x0.1] [2:0.4x0.4x0.1] [N/A:N/A] Measurements L x W x D (cm) [1:15.904] [2:0.126] [N/A:N/A] A (cm) : rea [1:1.59] [2:0.013] [N/A:N/A] Volume (cm) : [1:-4.70%] [2:89.70%] [N/A:N/A] % Reduction in Area: [1:-4.60%] [2:89.40%] [N/A:N/A] % Reduction  in Volume: [1:Unstageable/Unclassified] [2:Full Thickness Without Exposed] [N/A:N/A] Classification: [1:None Present] [2:Support Structures Small] [N/A:N/A] Exudate A mount: [1:N/A] [2:Serosanguineous] [N/A:N/A] Exudate Type: [1:N/A] [2:red, brown] [N/A:N/A] Exudate Color: [1:Flat and Intact] [2:Flat and Intact] [N/A:N/A] Wound Margin: [1:None Present (0%)] [2:None Present (0%)] [N/A:N/A] Granulation Amount: [1:Large (67-100%)] [2:Large (67-100%)] [N/A:N/A] Necrotic Amount: [1:Eschar] [2:Eschar] [N/A:N/A] Necrotic Tissue: [1:Fascia: No] [2:Fat Layer (Subcutaneous Tissue)] [N/A:N/A] Exposed Structures: [1:Fat Layer (Subcutaneous Tissue) Exposed: No Tendon: No Muscle: No Joint: No Bone: No Small (1-33%)] [2:Exposed: Yes Fascia: No Tendon: No Muscle: No Joint: No Bone: No Small (1-33%)] [N/A:N/A] Epithelialization: [1:Chemical/Enzymatic/Mechanical] [2:N/A] [N/A:N/A] Debridement: Pre-procedure Verification/Time Out 17:17 [2:N/A] [N/A:N/A] Taken: [1:Lidocaine 5% topical ointment] [2:N/A] [N/A:N/A] Pain Control: [1:N/A] [2:N/A] [N/A:N/A] Instrument: [1:None] [2:N/A]  [N/A:N/A] Bleeding: [1:0] [2:N/A] [N/A:N/A] Procedural Pain: [1:0] [2:N/A] [N/A:N/A] Post Procedural Pain: [1:Procedure was tolerated well] [2:N/A] [N/A:N/A] Debridement Treatment Response: [1:4.5x4.5x0.1] [2:N/A] [N/A:N/A] Post Debridement Measurements L x W x D (cm) [1:1.59] [2:N/A] [N/A:N/A] Post Debridement Volume: (cm) [1:Unstageable/Unclassified] [2:N/A] [N/A:N/A] Post Debridement Stage: [1:Debridement] [2:N/A] [N/A:N/A] Treatment Notes Wound #1 (Left Calcaneus) 3. Primary Dressing Applied Santyl 4. Secondary Dressing Dry Gauze Roll Gauze Heel Cup 7. Footwear/Offloading device applied Multipodus Splint/Boot Electronic Signature(s) Signed: 11/15/2019 5:47:38 PM By: [name] MD Signed: 11/16/2019 10:46:53 AM By: [name] RN Entered By: [name] on 11/15/2019 17:42:04 -------------------------------------------------------------------------------- Multi-Disciplinary Care Plan Details Patient Name: Date of Service: Vernon Frye, Moise L. 11/15/2019 3:30 PM Medical Record Number: [id number] Patient Account Number: [id number] Date of Birth/Sex: Treating RN: 02/10/1961 (58 y.o. [name] Primary Care Felipa Laroche: Nicholes Rough Other Clinician: Referring Shaunta Oncale: Treating Seyon Strader/Extender: Anselm Pancoast in Treatment: 4 Active Inactive Wound/Skin Impairment Nursing Diagnoses: Knowledge deficit related to ulceration/compromised skin integrity Goals: Patient/caregiver will verbalize understanding of skin care regimen Date Initiated: 10/18/2019 Target Resolution Date: 11/18/2019 Goal Status: Active Ulcer/skin breakdown will have a volume reduction of 30% by week 4 Date Initiated: 10/18/2019 Target Resolution Date: 11/18/2019 Goal Status: Active Interventions: Assess patient/caregiver ability to obtain necessary supplies Assess patient/caregiver ability to perform ulcer/skin care regimen upon admission and as needed Assess ulceration(s)  every visit Notes: Electronic Signature(s) Signed: 11/15/2019 5:39:56 PM By: [name] RN Entered By: [name] on 11/15/2019 15:28:01 -------------------------------------------------------------------------------- Pain Assessment Details Patient Name: Date of Service: Vernon Frye, Aneesh L. 11/15/2019 3:30 PM Medical Record Number: [id number] Patient Account Number: [id number] Date of Birth/Sex: Treating RN: 15-Dec-1960 (58 y.o. [name] Primary Care Izyan Ezzell: Nicholes Rough Other Clinician: Referring Seeley Hissong: Treating Delcia Spitzley/Extender: Anselm Pancoast in Treatment: 4 Active Problems Location of Pain Severity and Description of Pain Patient Has Paino Yes Site Locations Rate the pain. Current Pain Level: 8 Pain Management and Medication Current Pain Management: Electronic Signature(s) Signed: 11/15/2019 5:39:56 PM By: [name] RN Signed: 12/07/2019 9:10:01 AM By: Sandre Kitty Entered By: Sandre Kitty on 11/15/2019 16:37:06 -------------------------------------------------------------------------------- Patient/Caregiver Education Details Patient Name: Date of Service: Vernon Frye, [place] 4/20/2021andnbsp3:30 PM Medical Record Number: [id number] Patient Account Number: [id number] Date of Birth/Gender: Treating RN: Sep 17, 1960 (58 y.o. [name] Primary Care Physician: Nicholes Rough Other Clinician: Referring Physician: Treating Physician/Extender: Anselm Pancoast in Treatment: 4 Education Assessment Education Provided To: Patient Education Topics Provided Wound/Skin Impairment: Methods: Explain/Verbal Responses: State content correctly Electronic Signature(s) Signed: 11/15/2019 5:39:56 PM By: [name] RN Entered By: [name] on 11/15/2019 15:28:16 -------------------------------------------------------------------------------- Wound Assessment Details Patient Name: Date of Service: Vernon Frye,  Jrake L. 11/15/2019 3:30 PM Medical Record Number: [id number]  Patient Account Number: [id number] Date of Birth/Sex: Treating RN: [birth year] (58 y.o. [name]) [name] Primary Care Poetry Cerro: Nicholes Rough Other Clinician: Referring Ahnaf Caponi: Treating Askari Kinley/Extender: Anselm Pancoast in Treatment: 4 Wound Status Wound Number: 1 Primary Etiology: Pressure Ulcer Wound Location: Left Calcaneus Wound Status: Open Wounding Event: Pressure Injury Comorbid History: Sleep Apnea, Arrhythmia, Hypertension Date Acquired: 07/17/2019 Weeks Of Treatment: 4 Clustered Wound: No Photos Photo Uploaded By: Mikeal Hawthorne on 11/22/2019 12:57:34 Wound Measurements Length: (cm) 4.5 Width: (cm) 4.5 Depth: (cm) 0.1 Area: (cm) 15.904 Volume: (cm) 1.59 % Reduction in Area: -4.7% % Reduction in Volume: -4.6% Epithelialization: Small (1-33%) Tunneling: No Undermining: No Wound Description Classification: Unstageable/Unclassified Wound Margin: Flat and Intact Exudate Amount: None Present Foul Odor After Cleansing: No Slough/Fibrino No Wound Bed Granulation Amount: None Present (0%) Exposed Structure Necrotic Amount: Large (67-100%) Fascia Exposed: No Necrotic Quality: Eschar Fat Layer (Subcutaneous Tissue) Exposed: No Tendon Exposed: No Muscle Exposed: No Joint Exposed: No Bone Exposed: No Electronic Signature(s) Signed: 11/15/2019 5:39:56 PM By: [name] RN Signed: 11/15/2019 6:13:01 PM By: Levan Hurst RN, BSN Entered By: Levan Hurst on 11/15/2019 16:48:20 -------------------------------------------------------------------------------- Wound Assessment Details Patient Name: Date of Service: Vernon Frye, Daxter L. 11/15/2019 3:30 PM Medical Record Number: [id number] Patient Account Number: [id number] Date of Birth/Sex: Treating RN: 1960-10-04 (58 y.o. [name]) [name] Primary Care Elie Leppo: Nicholes Rough Other Clinician: Referring Jazlene Bares: Treating  Graelyn Bihl/Extender: Tonette Bihari Weeks in Treatment: 4 Wound Status Wound Number: 2 Primary Etiology: Arterial Insufficiency Ulcer Wound Location: Left T Fifth oe Wound Status: Open Wounding Event: Pressure Injury Comorbid History: Sleep Apnea, Arrhythmia, Hypertension Date Acquired: 07/17/2019 Weeks Of Treatment: 4 Clustered Wound: No Photos Photo Uploaded By: Mikeal Hawthorne on 11/22/2019 12:57:17 Wound Measurements Length: (cm) 0.4 Width: (cm) 0.4 Depth: (cm) 0.1 Area: (cm) 0.126 Volume: (cm) 0.013 % Reduction in Area: 89.7% % Reduction in Volume: 89.4% Epithelialization: Small (1-33%) Tunneling: No Undermining: No Wound Description Classification: Full Thickness Without Exposed Support Structures Wound Margin: Flat and Intact Exudate Amount: Small Exudate Type: Serosanguineous Exudate Color: red, brown Foul Odor After Cleansing: No Slough/Fibrino Yes Wound Bed Granulation Amount: None Present (0%) Exposed Structure Necrotic Amount: Large (67-100%) Fascia Exposed: No Necrotic Quality: Eschar Fat Layer (Subcutaneous Tissue) Exposed: Yes Tendon Exposed: No Muscle Exposed: No Joint Exposed: No Bone Exposed: No Electronic Signature(s) Signed: 11/15/2019 5:39:56 PM By: [name] RN Signed: 11/15/2019 6:13:01 PM By: Levan Hurst RN, BSN Entered By: Levan Hurst on 11/15/2019 16:48:41 -------------------------------------------------------------------------------- Vitals Details Patient Name: Date of Service: Vernon Frye, Naol L. 11/15/2019 3:30 PM Medical Record Number: [id number] Patient Account Number: [id number] Date of Birth/Sex: Treating RN: 31-Jan-1961 (58 y.o. [name], Morey Hummingbird Primary Care Tameika Heckmann: Nicholes Rough Other Clinician: Referring Shianna Bally: Treating Stassi Fadely/Extender: Anselm Pancoast in Treatment: 4 Vital Signs Time Taken: 16:36 Temperature (F): 98.2 Height (in): 75 Pulse (bpm): 50 Weight (lbs):  240 Respiratory Rate (breaths/min): 19 Body Mass Index (BMI): 30 Blood Pressure (mmHg): 118/66 Reference Range: 80 - 120 mg / dl Electronic Signature(s) Signed: 12/07/2019 9:10:01 AM By: Sandre Kitty Entered By: Sandre Kitty on 11/15/2019 16:36:53

## 2019-12-07 NOTE — Progress Notes (Signed)
CAEDIN, COCKING ([id number]) Visit Report for 12/06/2019 Arrival Information Details Patient Name: Date of Service: Vernon Frye, [name] 12/06/2019 11:00 A M Medical Record Number: [id number] Patient Account Number: [id number] Date of Birth/Sex: Treating RN: [birth year] (59 y.o. Vernon Frye) [name] Primary Care Wells Gerdeman: Nicholes Rough Other Clinician: Referring Mikaya Bunner: Treating Aurora Rody/Extender: Anselm Pancoast in Treatment: 7 Visit Information History Since Last Visit Added or deleted any medications: No Patient Arrived: Wheel Chair Any new allergies or adverse reactions: No Arrival Time: 11:53 Had a fall or experienced change in No Accompanied By: case manager activities of daily living that may affect Transfer Assistance: None risk of falls: Patient Identification Verified: Yes Signs or symptoms of abuse/neglect since last visito No Secondary Verification Process Completed: Yes Hospitalized since last visit: No Patient Requires Transmission-Based Precautions: No Implantable device outside of the clinic excluding No Patient Has Alerts: No cellular tissue based products placed in the center since last visit: Has Dressing in Place as Prescribed: Yes Pain Present Now: Yes Electronic Signature(s) Signed: 12/07/2019 9:08:01 AM By: Sandre Kitty Entered By: Sandre Kitty on 12/06/2019 11:54:13 -------------------------------------------------------------------------------- Encounter Discharge Information Details Patient Name: Date of Service: Vernon Frye, Vernon L. 12/06/2019 11:00 A M Medical Record Number: [id number] Patient Account Number: [id number] Date of Birth/Sex: Treating RN: Apr 01, 1961 (59 y.o. Marvis Repress Primary Care Jorah Hua: Nicholes Rough Other Clinician: Referring Jonn Chaikin: Treating Shadd Dunstan/Extender: Anselm Pancoast in Treatment: 7 Encounter Discharge Information Items Post Procedure Vitals Discharge Condition:  Stable Temperature (F): 98.2 Ambulatory Status: Wheelchair Pulse (bpm): 70 Discharge Destination: Home Respiratory Rate (breaths/min): 18 Transportation: Other Blood Pressure (mmHg): 139/86 Accompanied By: caseworker Schedule Follow-up Appointment: Yes Clinical Summary of Care: Patient Declined Electronic Signature(s) Signed: 12/06/2019 5:14:55 PM By: Kela Millin Entered By: Kela Millin on 12/06/2019 13:24:17 -------------------------------------------------------------------------------- Lower Extremity Assessment Details Patient Name: Date of Service: Vernon Frye, Sloan L. 12/06/2019 11:00 A M Medical Record Number: [id number] Patient Account Number: [id number] Date of Birth/Sex: Treating RN: Mar 10, 1961 (59 y.o. Ernestene Mention Primary Care Jaqwon Manfred: Nicholes Rough Other Clinician: Referring Shyniece Scripter: Treating Devanee Pomplun/Extender: Anselm Pancoast in Treatment: 7 Edema Assessment Assessed: [Left: No] [Right: No] Edema: [Left: Ye] [Right: s] Calf Left: Right: Point of Measurement: cm From Medial Instep 42.4 cm cm Ankle Left: Right: Point of Measurement: cm From Medial Instep 31 cm cm Vascular Assessment Pulses: Dorsalis Pedis Palpable: [Left:No] Electronic Signature(s) Signed: 12/06/2019 5:10:43 PM By: Baruch Gouty RN, BSN Entered By: Baruch Gouty on 12/06/2019 12:14:24 -------------------------------------------------------------------------------- Multi Wound Chart Details Patient Name: Date of Service: Vernon Frye, Vernon L. 12/06/2019 11:00 A M Medical Record Number: [id number] Patient Account Number: [id number] Date of Birth/Sex: Treating RN: 06-21-1961 (59 y.o. [name] Primary Care Lettie Czarnecki: Nicholes Rough Other Clinician: Referring Mavin Dyke: Treating Avelyn Touch/Extender: Anselm Pancoast in Treatment: 7 Vital Signs Height(in): 75 Pulse(bpm): 70 Weight(lbs): 240 Blood Pressure(mmHg): 139/86 Body Mass  Index(BMI): 30 Temperature(F): 98.2 Respiratory Rate(breaths/min): 18 Photos: [1:No Photos Left Calcaneus] [N/A:N/A N/A] Wound Location: [1:Pressure Injury] [N/A:N/A] Wounding Event: [1:Pressure Ulcer] [N/A:N/A] Primary Etiology: [1:Sleep Apnea, Arrhythmia,] [N/A:N/A] Comorbid History: [1:Hypertension 07/17/2019] [N/A:N/A] Date Acquired: [1:7] [N/A:N/A] Weeks of Treatment: [1:Open] [N/A:N/A] Wound Status: [1:4.3x4.5x0.4] [N/A:N/A] Measurements L x W x D (cm) [1:15.197] [N/A:N/A] A (cm) : rea [1:6.079] [N/A:N/A] Volume (cm) : [1:0.00%] [N/A:N/A] % Reduction in A rea: [1:-299.90%] [N/A:N/A] % Reduction in Volume: [1:Unstageable/Unclassified] [N/A:N/A] Classification: [1:Medium] [N/A:N/A] Exudate A mount: [1:Serosanguineous] [N/A:N/A] Exudate Type: [1:red, brown] [N/A:N/A] Exudate Color: [1:Flat and  Intact] [N/A:N/A] Wound Margin: [1:None Present (0%)] [N/A:N/A] Granulation A mount: [1:Large (67-100%)] [N/A:N/A] Necrotic A mount: [1:Eschar] [N/A:N/A] Necrotic Tissue: [1:Fat Layer (Subcutaneous Tissue)] [N/A:N/A] Exposed Structures: [1:Exposed: Yes Fascia: No Tendon: No Muscle: No Joint: No Bone: No Small (1-33%)] [N/A:N/A] Epithelialization: [1:Debridement - Excisional] [N/A:N/A] Debridement: Pre-procedure Verification/Time Out 12:30 [N/A:N/A] Taken: [1:Other] [N/A:N/A] Pain Control: [1:Necrotic/Eschar, Subcutaneous,] [N/A:N/A] Tissue Debrided: [1:Slough Skin/Subcutaneous Tissue] [N/A:N/A] Level: [1:19.35] [N/A:N/A] Debridement A (sq cm): [1:rea Blade, Forceps] [N/A:N/A] Instrument: [1:None] [N/A:N/A] Bleeding: [1:0] [N/A:N/A] Procedural Pain: [1:0] [N/A:N/A] Post Procedural Pain: Debridement Treatment Response: Procedure was tolerated well [N/A:N/A] Post Debridement Measurements L x 4.3x4.5x0.4 [N/A:N/A] W x D (cm) [1:6.079] [N/A:N/A] Post Debridement Volume: (cm) [1:Unstageable/Unclassified] [N/A:N/A] Post Debridement Stage: [1:Debridement] [N/A:N/A] Treatment  Notes Electronic Signature(s) Signed: 12/06/2019 5:53:12 PM By: [name] RN Signed: 12/06/2019 6:13:26 PM By: [name] MD Entered By: [name] on 12/06/2019 13:02:54 -------------------------------------------------------------------------------- Multi-Disciplinary Care Plan Details Patient Name: Date of Service: Vernon Frye, Ulisses L. 12/06/2019 11:00 A M Medical Record Number: [id number] Patient Account Number: [id number] Date of Birth/Sex: Treating RN: [birth year] (58 y.o. [name] Primary Care Emiley Digiacomo: Nicholes Rough Other Clinician: Referring Rakayla Ricklefs: Treating Zahki Hoogendoorn/Extender: Lynann Bologna, Janene Harvey in Treatment: 7 Active Inactive Wound/Skin Impairment Nursing Diagnoses: Knowledge deficit related to ulceration/compromised skin integrity Goals: Patient/caregiver will verbalize understanding of skin care regimen Date Initiated: 10/18/2019 Target Resolution Date: 12/16/2019 Goal Status: Active Ulcer/skin breakdown will have a volume reduction of 30% by week 4 Date Initiated: 10/18/2019 Date Inactivated: 11/22/2019 Target Resolution Date: 11/18/2019 Goal Status: Unmet Unmet Reason: comorbities Ulcer/skin breakdown will have a volume reduction of 50% by week 8 Date Initiated: 11/22/2019 Target Resolution Date: 12/16/2019 Goal Status: Active Interventions: Assess patient/caregiver ability to obtain necessary supplies Assess patient/caregiver ability to perform ulcer/skin care regimen upon admission and as needed Assess ulceration(s) every visit Notes: Electronic Signature(s) Signed: 12/06/2019 5:53:12 PM By: [name] RN Entered By: [name] on 12/06/2019 11:17:39 -------------------------------------------------------------------------------- Pain Assessment Details Patient Name: Date of Service: Vernon Frye, Jahmari L. 12/06/2019 11:00 A M Medical Record Number: [id number] Patient Account Number: [id number] Date of Birth/Sex: Treating  RN: July 30, 1960 (58 y.o. [name] Primary Care Daniyla Pfahler: Nicholes Rough Other Clinician: Referring Arlynn Mcdermid: Treating Edla Para/Extender: Anselm Pancoast in Treatment: 7 Active Problems Location of Pain Severity and Description of Pain Patient Has Paino Yes Site Locations Rate the pain. Current Pain Level: 8 Pain Management and Medication Current Pain Management: Electronic Signature(s) Signed: 12/06/2019 5:53:12 PM By: [name] RN Signed: 12/07/2019 9:08:01 AM By: Sandre Kitty Entered By: Sandre Kitty on 12/06/2019 11:55:09 -------------------------------------------------------------------------------- Patient/Caregiver Education Details Patient Name: Date of Service: Vernon Frye, [place] 5/11/2021andnbsp11:00 [place] Record Number: [id number] Patient Account Number: [id number] Date of Birth/Gender: Treating RN: 08-06-1960 (58 y.o. [name] Primary Care Physician: Nicholes Rough Other Clinician: Referring Physician: Treating Physician/Extender: Anselm Pancoast in Treatment: 7 Education Assessment Education Provided To: Patient Education Topics Provided Wound/Skin Impairment: Methods: Explain/Verbal Responses: State content correctly Electronic Signature(s) Signed: 12/06/2019 5:53:12 PM By: [name] RN Entered By: [name] on 12/06/2019 11:17:52 -------------------------------------------------------------------------------- Wound Assessment Details Patient Name: Date of Service: Vernon Frye, Myking L. 12/06/2019 11:00 A M Medical Record Number: [id number] Patient Account Number: [id number] Date of Birth/Sex: Treating RN: 1960-10-28 (58 y.o. [name] Primary Care Dayana Dalporto: Nicholes Rough Other Clinician: Referring Jacques Fife: Treating Jeydan Barner/Extender: Tonette Bihari Weeks in Treatment: 7 Wound Status Wound Number: 1 Primary Etiology: Pressure Ulcer Wound  Location: Left  Calcaneus Wound Status: Open Wounding Event: Pressure Injury Comorbid History: Sleep Apnea, Arrhythmia, Hypertension Date Acquired: 07/17/2019 Weeks Of Treatment: 7 Clustered Wound: No Wound Measurements Length: (cm) 4.3 Width: (cm) 4.5 Depth: (cm) 0.4 Area: (cm) 15.197 Volume: (cm) 6.079 % Reduction in Area: 0% % Reduction in Volume: -299.9% Epithelialization: Small (1-33%) Tunneling: No Undermining: No Wound Description Classification: Unstageable/Unclassified Wound Margin: Flat and Intact Exudate Amount: Medium Exudate Type: Serosanguineous Exudate Color: red, brown Foul Odor After Cleansing: No Slough/Fibrino Yes Wound Bed Granulation Amount: None Present (0%) Exposed Structure Necrotic Amount: Large (67-100%) Fascia Exposed: No Necrotic Quality: Eschar Fat Layer (Subcutaneous Tissue) Exposed: Yes Tendon Exposed: No Muscle Exposed: No Joint Exposed: No Bone Exposed: No Treatment Notes Wound #1 (Left Calcaneus) 1. Cleanse With Wound Cleanser 2. Periwound Care Skin Prep 3. Primary Dressing Applied Santyl Other primary dressing (specifiy in notes) 4. Secondary Dressing Dry Gauze Roll Gauze Heel Cup 5. Secured With Tape Notes moistened saline gauze over santyl. netting Electronic Signature(s) Signed: 12/06/2019 5:10:43 PM By: Baruch Gouty RN, BSN Signed: 12/06/2019 5:53:12 PM By: [name] RN Entered By: Baruch Gouty on 12/06/2019 12:14:48 -------------------------------------------------------------------------------- Vitals Details Patient Name: Date of Service: Vernon Frye, [place] 12/06/2019 11:00 A M Medical Record Number: [id number] Patient Account Number: [id number] Date of Birth/Sex: Treating RN: [birth year] (58 y.o. Vernon Frye) [name] Primary Care Cheetara Hoge: Nicholes Rough Other Clinician: Referring Damali Broadfoot: Treating Milley Vining/Extender: Anselm Pancoast in Treatment: 7 Vital Signs Time Taken: 11:54 Temperature (F):  98.2 Height (in): 75 Pulse (bpm): 70 Weight (lbs): 240 Respiratory Rate (breaths/min): 18 Body Mass Index (BMI): 30 Blood Pressure (mmHg): 139/86 Reference Range: 80 - 120 mg / dl Electronic Signature(s) Signed: 12/07/2019 9:08:01 AM By: Sandre Kitty Entered By: Sandre Kitty on 12/06/2019 11:54:40

## 2019-12-07 NOTE — Progress Notes (Signed)
QAASIM, KELLIS ([id number]) Visit Report for 11/22/2019 Arrival Information Details Patient Name: Date of Service: Vernon Frye, [place] 11/22/2019 10:30 A M Medical Record Number: [id number] Patient Account Number: [id number] Date of Birth/Sex: Treating RN: 05-Jun-1961 (58 y.o. [name]) [name] Primary Care Sinaya Minogue: Nicholes Rough Other Clinician: Referring Jadis Mika: Treating Odalis Jordan/Extender: Anselm Pancoast in Treatment: 5 Visit Information History Since Last Visit Added or deleted any medications: No Patient Arrived: Vernon Frye Any new allergies or adverse reactions: No Arrival Time: 11:02 Had a fall or experienced change in No Accompanied By: case manager activities of daily living that may affect Transfer Assistance: None risk of falls: Patient Identification Verified: Yes Signs or symptoms of abuse/neglect since last visito No Secondary Verification Process Completed: Yes Hospitalized since last visit: No Patient Requires Transmission-Based Precautions: No Implantable device outside of the clinic excluding No Patient Has Alerts: No cellular tissue based products placed in the center since last visit: Has Dressing in Place as Prescribed: Yes Pain Present Now: Yes Electronic Signature(s) Signed: 12/07/2019 9:09:16 AM By: Sandre Kitty Entered By: Sandre Kitty on 11/22/2019 11:02:29 -------------------------------------------------------------------------------- Clinic Level of Care Assessment Details Patient Name: Date of Service: Vernon Frye, Vernon L. 11/22/2019 10:30 A M Medical Record Number: [id number] Patient Account Number: [id number] Date of Birth/Sex: Treating RN: 09/23/1960 (58 y.o. [name]) [name] Primary Care Perlie Stene: Nicholes Rough Other Clinician: Referring Vani Gunner: Treating Farooq Petrovich/Extender: Anselm Pancoast in Treatment: 5 Clinic Level of Care Assessment Items TOOL 4 Quantity Score X- 1 0 Use when only an EandM is  performed on FOLLOW-UP visit ASSESSMENTS - Nursing Assessment / Reassessment X- 1 10 Reassessment of Co-morbidities (includes updates in patient status) X- 1 5 Reassessment of Adherence to Treatment Plan ASSESSMENTS - Wound and Skin A ssessment / Reassessment X - Simple Wound Assessment / Reassessment - one wound 1 5 []  - 0 Complex Wound Assessment / Reassessment - multiple wounds []  - 0 Dermatologic / Skin Assessment (not related to wound area) ASSESSMENTS - Focused Assessment []  - 0 Circumferential Edema Measurements - multi extremities []  - 0 Nutritional Assessment / Counseling / Intervention []  - 0 Lower Extremity Assessment (monofilament, tuning fork, pulses) []  - 0 Peripheral Arterial Disease Assessment (using hand held doppler) ASSESSMENTS - Ostomy and/or Continence Assessment and Care []  - 0 Incontinence Assessment and Management []  - 0 Ostomy Care Assessment and Management (repouching, etc.) PROCESS - Coordination of Care X - Simple Patient / Family Education for ongoing care 1 15 []  - 0 Complex (extensive) Patient / Family Education for ongoing care []  - 0 Staff obtains Programmer, systems, Records, T Results / Process Orders est []  - 0 Staff telephones HHA, Nursing Homes / Clarify orders / etc []  - 0 Routine Transfer to another Facility (non-emergent condition) []  - 0 Routine Hospital Admission (non-emergent condition) []  - 0 New Admissions / Biomedical engineer / Ordering NPWT Apligraf, etc. , []  - 0 Emergency Hospital Admission (emergent condition) X- 1 10 Simple Discharge Coordination []  - 0 Complex (extensive) Discharge Coordination PROCESS - Special Needs []  - 0 Pediatric / Minor Patient Management []  - 0 Isolation Patient Management []  - 0 Hearing / Language / Visual special needs []  - 0 Assessment of Community assistance (transportation, D/C planning, etc.) []  - 0 Additional assistance / Altered mentation []  - 0 Support Surface(s) Assessment  (bed, cushion, seat, etc.) INTERVENTIONS - Wound Cleansing / Measurement X - Simple Wound Cleansing - one wound 1 5 []  - 0 Complex Wound Cleansing -  multiple wounds X- 1 5 Wound Imaging (photographs - any number of wounds) []  - 0 Wound Tracing (instead of photographs) X- 1 5 Simple Wound Measurement - one wound []  - 0 Complex Wound Measurement - multiple wounds INTERVENTIONS - Wound Dressings []  - 0 Small Wound Dressing one or multiple wounds X- 1 15 Medium Wound Dressing one or multiple wounds []  - 0 Large Wound Dressing one or multiple wounds X- 1 5 Application of Medications - topical []  - 0 Application of Medications - injection INTERVENTIONS - Miscellaneous []  - 0 External ear exam []  - 0 Specimen Collection (cultures, biopsies, blood, body fluids, etc.) []  - 0 Specimen(s) / Culture(s) sent or taken to Lab for analysis []  - 0 Patient Transfer (multiple staff / Civil Service fast streamer / Similar devices) []  - 0 Simple Staple / Suture removal (25 or less) []  - 0 Complex Staple / Suture removal (26 or more) []  - 0 Hypo / Hyperglycemic Management (close monitor of Blood Glucose) []  - 0 Ankle / Brachial Index (ABI) - do not check if billed separately X- 1 5 Vital Signs Has the patient been seen at the hospital within the last three years: Yes Total Score: 85 Level Of Care: New/Established - Level 3 Electronic Signature(s) Signed: 11/22/2019 5:28:23 PM By: [name] RN Entered By: [name] on 11/22/2019 12:12:10 -------------------------------------------------------------------------------- Encounter Discharge Information Details Patient Name: Date of Service: Vernon Frye, Vernon L. 11/22/2019 10:30 A M Medical Record Number: [id number] Patient Account Number: [id number] Date of Birth/Sex: Treating RN: [birth year] (59 y.o. Marvis Repress Primary Care Georgean Spainhower: Nicholes Rough Other Clinician: Referring Cinde Ebert: Treating Perri Aragones/Extender: Anselm Pancoast in Treatment: 5 Encounter Discharge Information Items Discharge Condition: Stable Ambulatory Status: Wheelchair Discharge Destination: Home Transportation: Other Accompanied By: careworker Schedule Follow-up Appointment: Yes Clinical Summary of Care: Patient Declined Electronic Signature(s) Signed: 11/22/2019 5:26:58 PM By: Kela Millin Entered By: Kela Millin on 11/22/2019 13:22:48 -------------------------------------------------------------------------------- Lower Extremity Assessment Details Patient Name: Date of Service: Vernon Frye, Vernon L. 11/22/2019 10:30 A M Medical Record Number: [id number] Patient Account Number: [id number] Date of Birth/Sex: Treating RN: 1961-05-08 (59 y.o. Marvis Repress Primary Care Tosca Pletz: Nicholes Rough Other Clinician: Referring Matheson Vandehei: Treating Frutoso Dimare/Extender: Anselm Pancoast in Treatment: 5 Edema Assessment Assessed: Shirlyn Goltz: No] Patrice Paradise: No] Edema: [Left: Ye] [Right: s] Calf Left: Right: Point of Measurement: cm From Medial Instep 42 cm cm Ankle Left: Right: Point of Measurement: cm From Medial Instep 26 cm cm Vascular Assessment Pulses: Dorsalis Pedis Palpable: [Left:Yes] Electronic Signature(s) Signed: 11/22/2019 5:26:58 PM By: Kela Millin Entered By: Kela Millin on 11/22/2019 11:18:33 -------------------------------------------------------------------------------- Multi Wound Chart Details Patient Name: Date of Service: Vernon Frye, Vernon L. 11/22/2019 10:30 A M Medical Record Number: [id number] Patient Account Number: [id number] Date of Birth/Sex: Treating RN: [birth year] (58 y.o. [name] Primary Care Shiesha Jahn: Nicholes Rough Other Clinician: Referring Sekai Gitlin: Treating Darroll Bredeson/Extender: Anselm Pancoast in Treatment: 5 Vital Signs Height(in): 75 Pulse(bpm): 63 Weight(lbs): 240 Blood Pressure(mmHg): 126/78 Body Mass Index(BMI):  30 Temperature(F): 98.2 Respiratory Rate(breaths/min): 19 Photos: [1:No Photos Left Calcaneus] [2:No Photos Left T Fifth oe] [N/A:N/A N/A] Wound Location: [1:Pressure Injury] [2:Pressure Injury] [N/A:N/A] Wounding Event: [1:Pressure Ulcer] [2:Arterial Insufficiency Ulcer] [N/A:N/A] Primary Etiology: [1:Sleep Apnea, Arrhythmia,] [2:Sleep Apnea, Arrhythmia,] [N/A:N/A] Comorbid History: [1:Hypertension 07/17/2019] [2:Hypertension 07/17/2019] [N/A:N/A] Date Acquired: [1:5] [2:5] [N/A:N/A] Weeks of Treatment: [1:Open] [2:Open] [N/A:N/A] Wound Status: [1:4.2x4.6x0.1] [2:0x0x0] [N/A:N/A] Measurements L x W x D (cm) [1:15.174] [2:0] [N/A:N/A] A (cm) :  rea [1:1.517] [2:0] [N/A:N/A] Volume (cm) : [1:0.20%] [2:100.00%] [N/A:N/A] % Reduction in Area: [1:0.20%] [2:100.00%] [N/A:N/A] % Reduction in Volume: [1:Unstageable/Unclassified] [2:Full Thickness Without Exposed] [N/A:N/A] Classification: [1:None Present] [2:Support Structures None Present] [N/A:N/A] Exudate A mount: [1:Flat and Intact] [2:Flat and Intact] [N/A:N/A] Wound Margin: [1:None Present (0%)] [2:None Present (0%)] [N/A:N/A] Granulation Amount: [1:Large (67-100%)] [2:None Present (0%)] [N/A:N/A] Necrotic Amount: [1:Eschar] [2:N/A] [N/A:N/A] Necrotic Tissue: [1:Fascia: No] [2:Fascia: No] [N/A:N/A] Exposed Structures: [1:Fat Layer (Subcutaneous Tissue) Exposed: No Tendon: No Muscle: No Joint: No Bone: No Small (1-33%)] [2:Fat Layer (Subcutaneous Tissue) Exposed: No Tendon: No Muscle: No Joint: No Bone: No Large (67-100%)] [N/A:N/A] Treatment Notes Electronic Signature(s) Signed: 11/22/2019 5:28:23 PM By: [name] RN Signed: 11/22/2019 5:41:23 PM By: [name] MD Entered By: [name] on 11/22/2019 11:44:26 -------------------------------------------------------------------------------- Multi-Disciplinary Care Plan Details Patient Name: Date of Service: Vernon Frye, Vernon L. 11/22/2019 10:30 A M Medical Record Number:  [id number] Patient Account Number: [id number] Date of Birth/Sex: Treating RN: 05/26/1961 (58 y.o. [name] Primary Care Vickye Astorino: Nicholes Rough Other Clinician: Referring Bricyn Labrada: Treating Davey Bergsma/Extender: Lynann Bologna, Janene Harvey in Treatment: 5 Active Inactive Wound/Skin Impairment Nursing Diagnoses: Knowledge deficit related to ulceration/compromised skin integrity Goals: Patient/caregiver will verbalize understanding of skin care regimen Date Initiated: 10/18/2019 Target Resolution Date: 12/16/2019 Goal Status: Active Ulcer/skin breakdown will have a volume reduction of 30% by week 4 Date Initiated: 10/18/2019 Date Inactivated: 11/22/2019 Target Resolution Date: 11/18/2019 Goal Status: Unmet Unmet Reason: comorbities Ulcer/skin breakdown will have a volume reduction of 50% by week 8 Date Initiated: 11/22/2019 Target Resolution Date: 12/16/2019 Goal Status: Active Interventions: Assess patient/caregiver ability to obtain necessary supplies Assess patient/caregiver ability to perform ulcer/skin care regimen upon admission and as needed Assess ulceration(s) every visit Notes: Electronic Signature(s) Signed: 11/22/2019 5:28:23 PM By: [name] RN Entered By: [name] on 11/22/2019 11:19:06 -------------------------------------------------------------------------------- Pain Assessment Details Patient Name: Date of Service: Vernon Frye, Vernon L. 11/22/2019 10:30 A M Medical Record Number: [id number] Patient Account Number: [id number] Date of Birth/Sex: Treating RN: August 16, 1960 (58 y.o. [name] Primary Care Spenser Cong: Nicholes Rough Other Clinician: Referring Tanasha Menees: Treating Oshae Simmering/Extender: Anselm Pancoast in Treatment: 5 Active Problems Location of Pain Severity and Description of Pain Patient Has Paino Yes Site Locations Duration of the Pain. Duration of the Pain. Constant / Intermittento Intermittent Rate the  pain. Current Pain Level: 8 Pain Management and Medication Current Pain Management: Electronic Signature(s) Signed: 11/22/2019 5:28:23 PM By: [name] RN Signed: 12/07/2019 9:09:16 AM By: Sandre Kitty Entered By: Sandre Kitty on 11/22/2019 11:05:08 -------------------------------------------------------------------------------- Patient/Caregiver Education Details Patient Name: Date of Service: Vernon Frye, Vernon L. 4/27/2021andnbsp10:30 A M Medical Record Number: [id number] Patient Account Number: [id number] Date of Birth/Gender: Treating RN: 1960-10-27 (58 y.o. [name] Primary Care Physician: Nicholes Rough Other Clinician: Referring Physician: Treating Physician/Extender: Anselm Pancoast in Treatment: 5 Education Assessment Education Provided To: Patient Education Topics Provided Wound/Skin Impairment: Methods: Explain/Verbal Responses: State content correctly Electronic Signature(s) Signed: 11/22/2019 5:28:23 PM By: [name] RN Entered By: [name] on 11/22/2019 11:09:31 -------------------------------------------------------------------------------- Wound Assessment Details Patient Name: Date of Service: Vernon Frye, Vernon L. 11/22/2019 10:30 A M Medical Record Number: [id number] Patient Account Number: [id number] Date of Birth/Sex: Treating RN: [birth year] (58 y.o. [name] Primary Care Eva Griffo: Nicholes Rough Other Clinician: Referring Thamara Leger: Treating Cellie Dardis/Extender: Tonette Bihari Weeks in Treatment: 5 Wound Status Wound Number: 1 Primary Etiology: Pressure Ulcer Wound Location: Left Calcaneus  Wound Status: Open Wounding Event: Pressure Injury Comorbid History: Sleep Apnea, Arrhythmia, Hypertension Date Acquired: 07/17/2019 Weeks Of Treatment: 5 Clustered Wound: No Photos Photo Uploaded By: Mikeal Hawthorne on 11/23/2019 11:49:41 Wound Measurements Length: (cm) 4.2 Width: (cm) 4.6 Depth: (cm)  0.1 Area: (cm) 15.174 Volume: (cm) 1.517 % Reduction in Area: 0.2% % Reduction in Volume: 0.2% Epithelialization: Small (1-33%) Tunneling: No Undermining: No Wound Description Classification: Unstageable/Unclassified Wound Margin: Flat and Intact Exudate Amount: None Present Foul Odor After Cleansing: No Slough/Fibrino No Wound Bed Granulation Amount: None Present (0%) Exposed Structure Necrotic Amount: Large (67-100%) Fascia Exposed: No Necrotic Quality: Eschar Fat Layer (Subcutaneous Tissue) Exposed: No Tendon Exposed: No Muscle Exposed: No Joint Exposed: No Bone Exposed: No Electronic Signature(s) Signed: 11/22/2019 5:26:58 PM By: Kela Millin Signed: 11/22/2019 5:28:23 PM By: [name] RN Entered By: Kela Millin on 11/22/2019 11:18:58 -------------------------------------------------------------------------------- Wound Assessment Details Patient Name: Date of Service: Vernon Frye, Vernon L. 11/22/2019 10:30 A M Medical Record Number: [id number] Patient Account Number: [id number] Date of Birth/Sex: Treating RN: [birth year] (58 y.o. [name]) [name] Primary Care Terrian Ridlon: Nicholes Rough Other Clinician: Referring Mardell Suttles: Treating Cashius Grandstaff/Extender: Tonette Bihari Weeks in Treatment: 5 Wound Status Wound Number: 2 Primary Etiology: Arterial Insufficiency Ulcer Wound Location: Left T Fifth oe Wound Status: Open Wounding Event: Pressure Injury Comorbid History: Sleep Apnea, Arrhythmia, Hypertension Date Acquired: 07/17/2019 Weeks Of Treatment: 5 Clustered Wound: No Photos Photo Uploaded By: Mikeal Hawthorne on 11/23/2019 11:49:42 Wound Measurements Length: (cm) Width: (cm) Depth: (cm) Area: (cm) Volume: (cm) 0 % Reduction in Area: 100% 0 % Reduction in Volume: 100% 0 Epithelialization: Large (67-100%) 0 Tunneling: No 0 Undermining: No Wound Description Classification: Full Thickness Without Exposed Support Structures Wound Margin:  Flat and Intact Exudate Amount: None Present Foul Odor After Cleansing: No Slough/Fibrino No Wound Bed Granulation Amount: None Present (0%) Exposed Structure Necrotic Amount: None Present (0%) Fascia Exposed: No Fat Layer (Subcutaneous Tissue) Exposed: No Tendon Exposed: No Muscle Exposed: No Joint Exposed: No Bone Exposed: No Electronic Signature(s) Signed: 11/22/2019 5:26:58 PM By: Kela Millin Signed: 11/22/2019 5:28:23 PM By: [name] RN Entered By: Kela Millin on 11/22/2019 11:19:51 -------------------------------------------------------------------------------- [place] Details Patient Name: Date of Service: Vernon Frye, Vernon L. 11/22/2019 10:30 A M Medical Record Number: [id number] Patient Account Number: [id number] Date of Birth/Sex: Treating RN: 1960/11/08 (58 y.o. [name]) [name] Primary Care Naeem Quillin: Nicholes Rough Other Clinician: Referring Sian Rockers: Treating Mcarthur Ivins/Extender: Anselm Pancoast in Treatment: 5 Vital Signs Time Taken: 11:04 Temperature (F): 98.2 Height (in): 75 Pulse (bpm): 63 Weight (lbs): 240 Respiratory Rate (breaths/min): 19 Body Mass Index (BMI): 30 Blood Pressure (mmHg): 126/78 Reference Range: 80 - 120 mg / dl Electronic Signature(s) Signed: 12/07/2019 9:09:16 AM By: Sandre Kitty Entered By: Sandre Kitty on 11/22/2019 11:04:58

## 2019-12-13 ENCOUNTER — Other Ambulatory Visit: Payer: Self-pay

## 2019-12-13 ENCOUNTER — Encounter (HOSPITAL_BASED_OUTPATIENT_CLINIC_OR_DEPARTMENT_OTHER): Payer: Self-pay | Admitting: Internal Medicine

## 2019-12-13 NOTE — Progress Notes (Signed)
Vernon Frye, Vernon Frye ([id number]) Visit Report for 12/13/2019 Arrival Information Details Patient Name: Date of Service: Vernon Frye, Vernon Frye 12/13/2019 1:15 PM Medical Record Number: [id number] Patient Account Number: [id number] Date of Birth/Sex: Treating RN: 10/06/1960 (58 y.o. [name]) [name] Primary Care Kairee Isa: Nicholes Rough Other Clinician: Referring Adessa Primiano: Treating Jefte Carithers/Extender: Anselm Pancoast in Treatment: 8 Visit Information History Since Last Visit Added or deleted any medications: No Patient Arrived: Wheel Chair Any new allergies or adverse reactions: No Arrival Time: 13:25 Had a fall or experienced change in No Accompanied By: self activities of daily living that may affect Transfer Assistance: None risk of falls: Patient Identification Verified: Yes Signs or symptoms of abuse/neglect since last visito No Secondary Verification Process Completed: Yes Hospitalized since last visit: No Patient Requires Transmission-Based Precautions: No Implantable device outside of the clinic excluding No Patient Has Alerts: No cellular tissue based products placed in the center since last visit: Has Dressing in Place as Prescribed: Yes Pain Present Now: Yes Electronic Signature(s) Signed: 12/13/2019 3:47:52 PM By: Deon Pilling Entered By: Deon Pilling on 12/13/2019 13:28:08 -------------------------------------------------------------------------------- Encounter Discharge Information Details Patient Name: Date of Service: Vernon Frye, Vernon L. 12/13/2019 1:15 PM Medical Record Number: [id number] Patient Account Number: [id number] Date of Birth/Sex: Treating RN: [birth year] (58 y.o. [name] Primary Care Caelin Rayl: Nicholes Rough Other Clinician: Referring Vivan Agostino: Treating Rainen Vanrossum/Extender: Anselm Pancoast in Treatment: 8 Encounter Discharge Information Items Post Procedure Vitals Discharge Condition: Stable Temperature (F):  98.2 Ambulatory Status: Wheelchair Pulse (bpm): 58 Discharge Destination: Home Respiratory Rate (breaths/min): 20 Transportation: Private Auto Blood Pressure (mmHg): 140/81 Accompanied By: self Schedule Follow-up Appointment: Yes Clinical Summary of Care: Electronic Signature(s) Signed: 12/13/2019 3:47:52 PM By: Deon Pilling Entered By: Deon Pilling on 12/13/2019 14:38:38 -------------------------------------------------------------------------------- Lower Extremity Assessment Details Patient Name: Date of Service: Vernon Frye, Vernon L. 12/13/2019 1:15 PM Medical Record Number: [id number] Patient Account Number: [id number] Date of Birth/Sex: Treating RN: 1961-05-09 (58 y.o. [name] Primary Care Kylena Mole: Nicholes Rough Other Clinician: Referring Takeila Thayne: Treating Illya Gienger/Extender: Tonette Bihari Weeks in Treatment: 8 Edema Assessment Assessed: Shirlyn Goltz: Yes] [Right: No] Edema: [Left: Ye] [Right: s] Calf Left: Right: Point of Measurement: cm From Medial Instep 41.3 cm cm Ankle Left: Right: Point of Measurement: cm From Medial Instep 29.5 cm cm Vascular Assessment Pulses: Dorsalis Pedis Palpable: [Left:Yes] Electronic Signature(s) Signed: 12/13/2019 3:47:52 PM By: Deon Pilling Signed: 12/13/2019 5:13:38 PM By: [name] RN Entered By: Deon Pilling on 12/13/2019 13:30:26 -------------------------------------------------------------------------------- [place] Details Patient Name: Date of Service: Vernon Frye, Vernon L. 12/13/2019 1:15 PM Medical Record Number: [id number] Patient Account Number: [id number] Date of Birth/Sex: Treating RN: Oct 02, 1960 (58 y.o. [name] Primary Care Chaniece Barbato: Nicholes Rough Other Clinician: Referring Shareka Casale: Treating Anastasios Melander/Extender: Anselm Pancoast in Treatment: 8 Active Inactive Wound/Skin Impairment Nursing Diagnoses: Knowledge deficit related to  ulceration/compromised skin integrity Goals: Patient/caregiver will verbalize understanding of skin care regimen Date Initiated: 10/18/2019 Target Resolution Date: 12/16/2019 Goal Status: Active Ulcer/skin breakdown will have a volume reduction of 30% by week 4 Date Initiated: 10/18/2019 Date Inactivated: 11/22/2019 Target Resolution Date: 11/18/2019 Goal Status: Unmet Unmet Reason: comorbities Ulcer/skin breakdown will have a volume reduction of 50% by week 8 Date Initiated: 11/22/2019 Target Resolution Date: 12/16/2019 Goal Status: Active Interventions: Assess patient/caregiver ability to obtain necessary supplies Assess patient/caregiver ability to perform ulcer/skin care regimen upon admission and as needed Assess ulceration(s) every visit Notes: Electronic Signature(s) Signed:  12/13/2019 5:13:38 PM By: [name] RN Entered By: [name] on 12/13/2019 13:22:51 -------------------------------------------------------------------------------- Pain Assessment Details Patient Name: Date of Service: Vernon Frye, Vernon Frye 12/13/2019 1:15 PM Medical Record Number: [id number] Patient Account Number: [id number] Date of Birth/Sex: Treating RN: [birth year] (58 y.o. [name] Primary Care Doyce Stonehouse: Nicholes Rough Other Clinician: Referring Attie Nawabi: Treating Bradlee Heitman/Extender: Anselm Pancoast in Treatment: 8 Active Problems Location of Pain Severity and Description of Pain Patient Has Paino Yes Site Locations Pain Location: Pain in Ulcers Rate the pain. Current Pain Level: 8 Worst Pain Level: 10 Least Pain Level: 0 Tolerable Pain Level: 7 Pain Management and Medication Current Pain Management: Medication: No Cold Application: No Rest: No Massage: No Activity: No T.E.N.S.: No Heat Application: No Leg drop or elevation: No Is the Current Pain Management Adequate: Adequate How does your wound impact your activities of daily livingo Sleep: No Bathing:  No Appetite: No Relationship With Others: No Bladder Continence: No Emotions: No Bowel Continence: No Work: No Toileting: No Drive: No Dressing: No Hobbies: No Electronic Signature(s) Signed: 12/13/2019 3:47:52 PM By: Deon Pilling Signed: 12/13/2019 5:13:38 PM By: [name] RN Entered By: Deon Pilling on 12/13/2019 13:29:59 -------------------------------------------------------------------------------- Patient/Caregiver Education Details Patient Name: Date of Service: Vernon Frye, [place]. 5/18/2021andnbsp1:15 PM Medical Record Number: [id number] Patient Account Number: [id number] Date of Birth/Gender: Treating RN: [birth year] (58 y.o. [name] Primary Care Physician: Nicholes Rough Other Clinician: Referring Physician: Treating Physician/Extender: Anselm Pancoast in Treatment: 8 Education Assessment Education Provided To: Patient Education Topics Provided Wound/Skin Impairment: Methods: Explain/Verbal Responses: State content correctly Electronic Signature(s) Signed: 12/13/2019 5:13:38 PM By: [name] RN Entered By: [name] on 12/13/2019 13:23:06 -------------------------------------------------------------------------------- Wound Assessment Details Patient Name: Date of Service: Vernon Frye, Vernon L. 12/13/2019 1:15 PM Medical Record Number: [id number] Patient Account Number: [id number] Date of Birth/Sex: Treating RN: 04/15/1961 (58 y.o. [name]) [name] Primary Care Dorsie Sethi: Nicholes Rough Other Clinician: Referring Mekisha Bittel: Treating Evaristo Tsuda/Extender: Tonette Bihari Weeks in Treatment: 8 Wound Status Wound Number: 1 Primary Etiology: Pressure Ulcer Wound Location: Left Calcaneus Wound Status: Open Wounding Event: Pressure Injury Comorbid History: Sleep Apnea, Arrhythmia, Hypertension Date Acquired: 07/17/2019 Weeks Of Treatment: 8 Clustered Wound: No Wound Measurements Length: (cm) 4.5 Width: (cm) 4.4 Depth:  (cm) 0.3 Area: (cm) 15.551 Volume: (cm) 4.665 % Reduction in Area: -2.3% % Reduction in Volume: -206.9% Epithelialization: Small (1-33%) Tunneling: No Undermining: No Wound Description Classification: Unstageable/Unclassified Wound Margin: Flat and Intact Exudate Amount: Medium Exudate Type: Serosanguineous Exudate Color: red, brown Wound Bed Granulation Amount: None Present (0%) Necrotic Amount: Large (67-100%) Necrotic Quality: Eschar Foul Odor After Cleansing: No Slough/Fibrino Yes Exposed Structure Fascia Exposed: No Fat Layer (Subcutaneous Tissue) Exposed: Yes Tendon Exposed: No Muscle Exposed: No Joint Exposed: No Bone Exposed: No Treatment Notes Wound #1 (Left Calcaneus) 1. Cleanse With Wound Cleanser 3. Primary Dressing Applied Santyl Other primary dressing (specifiy in notes) 4. Secondary Dressing Dry Gauze Roll Gauze Heel Cup 5. Secured With Medipore tape Notes moistened saline gauze over santyl. netting Electronic Signature(s) Signed: 12/13/2019 3:47:52 PM By: Deon Pilling Signed: 12/13/2019 5:13:38 PM By: [name] RN Entered By: Deon Pilling on 12/13/2019 13:30:44 -------------------------------------------------------------------------------- Vitals Details Patient Name: Date of Service: Vernon Frye, [place]. 12/13/2019 1:15 PM Medical Record Number: [id number] Patient Account Number: [id number] Date of Birth/Sex: Treating RN: 14-Aug-1960 (58 y.o. [name] Primary Care Elohim Brune: Nicholes Rough Other Clinician: Referring Quianna Avery: Treating Emberlie Gotcher/Extender: Lynann Bologna,  Sheri Weeks in Treatment: 8 Vital Signs Time Taken: 13:26 Temperature (F): 98.2 Height (in): 75 Pulse (bpm): 58 Weight (lbs): 240 Respiratory Rate (breaths/min): 20 Body Mass Index (BMI): 30 Blood Pressure (mmHg): 140/81 Reference Range: 80 - 120 mg / dl Electronic Signature(s) Signed: 12/13/2019 3:47:52 PM By: Deon Pilling Entered By: Deon Pilling  on 12/13/2019 13:29:34
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
## 2019-12-22 ENCOUNTER — Encounter (HOSPITAL_BASED_OUTPATIENT_CLINIC_OR_DEPARTMENT_OTHER): Payer: Self-pay | Admitting: Internal Medicine
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
## 2019-12-29 ENCOUNTER — Encounter (HOSPITAL_BASED_OUTPATIENT_CLINIC_OR_DEPARTMENT_OTHER): Payer: Self-pay | Attending: Internal Medicine | Admitting: Internal Medicine

## 2019-12-29 DIAGNOSIS — L97521 Non-pressure chronic ulcer of other part of left foot limited to breakdown of skin: Secondary | ICD-10-CM | POA: Insufficient documentation

## 2019-12-29 DIAGNOSIS — Z8614 Personal history of Methicillin resistant Staphylococcus aureus infection: Secondary | ICD-10-CM | POA: Insufficient documentation

## 2019-12-29 DIAGNOSIS — L89620 Pressure ulcer of left heel, unstageable: Secondary | ICD-10-CM | POA: Insufficient documentation

## 2019-12-29 DIAGNOSIS — G4730 Sleep apnea, unspecified: Secondary | ICD-10-CM | POA: Insufficient documentation

## 2019-12-29 DIAGNOSIS — I70245 Atherosclerosis of native arteries of left leg with ulceration of other part of foot: Secondary | ICD-10-CM | POA: Insufficient documentation

## 2019-12-29 DIAGNOSIS — I10 Essential (primary) hypertension: Secondary | ICD-10-CM | POA: Insufficient documentation

## 2019-12-29 DIAGNOSIS — Z96652 Presence of left artificial knee joint: Secondary | ICD-10-CM | POA: Insufficient documentation
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
## 2020-01-02 NOTE — Progress Notes (Signed)
SANTE, BIEDERMANN ([id number]) Visit Report for 12/29/2019 Arrival Information Details Patient Name: Date of Service: Vernon Frye, Vernon L. 12/29/2019 11:00 A M Medical Record Number: [id number] Patient Account Number: [id number] Date of Birth/Sex: Treating RN: 1960-12-18 (59 y.o. Hessie Diener Primary Care Reve Crocket: Nicholes Rough Other Clinician: Referring Afsa Meany: Treating Deandrew Hoecker/Extender: Anselm Pancoast in Treatment: 10 Visit Information History Since Last Visit Added or deleted any medications: No Patient Arrived: Wheel Chair Any new allergies or adverse reactions: No Arrival Time: 11:27 Had a fall or experienced change in No Accompanied By: self activities of daily living that may affect Transfer Assistance: Manual risk of falls: Patient Identification Verified: Yes Signs or symptoms of abuse/neglect since last visito No Secondary Verification Process Completed: Yes Hospitalized since last visit: No Patient Requires Transmission-Based Precautions: No Implantable device outside of the clinic excluding No Patient Has Alerts: No cellular tissue based products placed in the center since last visit: Has Dressing in Place as Prescribed: Yes Pain Present Now: Yes Electronic Signature(s) Signed: 01/02/2020 1:20:36 PM By: Sandre Kitty Entered By: Sandre Kitty on 12/29/2019 11:28:00 -------------------------------------------------------------------------------- Encounter Discharge Information Details Patient Name: Date of Service: Vernon Frye, Vernon L. 12/29/2019 11:00 A M Medical Record Number: [id number] Patient Account Number: [id number] Date of Birth/Sex: Treating RN: February 11, 1961 (59 y.o. [name] Primary Care Arville Postlewaite: Nicholes Rough Other Clinician: Referring Phyliss Hulick: Treating Sederick Jacobsen/Extender: Anselm Pancoast in Treatment: 10 Encounter Discharge Information Items Post Procedure Vitals Discharge Condition:  Stable Temperature (F): 98.3 Ambulatory Status: Wheelchair Pulse (bpm): 60 Discharge Destination: Home Respiratory Rate (breaths/min): 18 Transportation: Private Auto Blood Pressure (mmHg): 170/90 Accompanied By: alone Schedule Follow-up Appointment: Yes Clinical Summary of Care: Patient Declined Electronic Signature(s) Signed: 01/02/2020 6:28:13 PM By: Levan Hurst RN, BSN Entered By: Levan Hurst on 12/29/2019 12:18:24 -------------------------------------------------------------------------------- Lower Extremity Assessment Details Patient Name: Date of Service: Vernon Frye, Vernon L. 12/29/2019 11:00 A M Medical Record Number: [id number] Patient Account Number: [id number] Date of Birth/Sex: Treating RN: Jul 17, 1961 (59 y.o. [name] Primary Care Makayla Confer: Nicholes Rough Other Clinician: Referring Sharia Averitt: Treating Mate Alegria/Extender: Anselm Pancoast in Treatment: 10 Edema Assessment Assessed: Shirlyn Goltz: No] Patrice Paradise: No] Edema: [Left: Ye] [Right: s] Calf Left: Right: Point of Measurement: cm From Medial Instep 41 cm cm Ankle Left: Right: Point of Measurement: cm From Medial Instep 30 cm cm Vascular Assessment Pulses: Dorsalis Pedis Palpable: [Left:Yes] Electronic Signature(s) Signed: 01/02/2020 6:28:13 PM By: Levan Hurst RN, BSN Entered By: Levan Hurst on 12/29/2019 11:41:40 -------------------------------------------------------------------------------- Multi Wound Chart Details Patient Name: Date of Service: Vernon Frye, Vernon L. 12/29/2019 11:00 A M Medical Record Number: [id number] Patient Account Number: [id number] Date of Birth/Sex: Treating RN: 17-Sep-1960 (59 y.o. Hessie Diener Primary Care Michele Judy: Nicholes Rough Other Clinician: Referring Bernadine Melecio: Treating Forrestine Lecrone/Extender: Anselm Pancoast in Treatment: 10 Vital Signs Height(in): 75 Pulse(bpm): 60 Weight(lbs): 240 Blood Pressure(mmHg): 170/90 Body Mass  Index(BMI): 30 Temperature(F): 98.3 Respiratory Rate(breaths/min): 18 Photos: [1:No Photos Left Calcaneus] [N/A:N/A N/A] Wound Location: [1:Pressure Injury] [N/A:N/A] Wounding Event: [1:Pressure Ulcer] [N/A:N/A] Primary Etiology: [1:Sleep Apnea, Arrhythmia,] [N/A:N/A] Comorbid History: [1:Hypertension 07/17/2019] [N/A:N/A] Date Acquired: [1:10] [N/A:N/A] Weeks of Treatment: [1:Open] [N/A:N/A] Wound Status: [1:3.4x4x0.6] [N/A:N/A] Measurements L x W x D (cm) [1:10.681] [N/A:N/A] A (cm) : rea [1:6.409] [N/A:N/A] Volume (cm) : [1:29.70%] [N/A:N/A] % Reduction in A rea: [1:-321.60%] [N/A:N/A] % Reduction in Volume: [1:Unstageable/Unclassified] [N/A:N/A] Classification: [1:Medium] [N/A:N/A] Exudate A mount: [1:Serosanguineous] [N/A:N/A] Exudate Type: [1:red, brown] [N/A:N/A] Exudate Color: [  1:Flat and Intact] [N/A:N/A] Wound Margin: [1:Small (1-33%)] [N/A:N/A] Granulation A mount: [1:Pink] [N/A:N/A] Granulation Quality: [1:Large (67-100%)] [N/A:N/A] Necrotic A mount: [1:Fat Layer (Subcutaneous Tissue)] [N/A:N/A] Exposed Structures: [1:Exposed: Yes Fascia: No Tendon: No Muscle: No Joint: No Bone: No Small (1-33%)] [N/A:N/A] Epithelialization: [1:Debridement - Excisional] [N/A:N/A] Debridement: Pre-procedure Verification/Time Out 11:58 [N/A:N/A] Taken: [1:Lidocaine 4% Topical Solution] [N/A:N/A] Pain Control: [1:Fat, Subcutaneous, Slough] [N/A:N/A] Tissue Debrided: [1:Skin/Subcutaneous Tissue] [N/A:N/A] Level: [1:13.6] [N/A:N/A] Debridement A (sq cm): [1:rea Curette] [N/A:N/A] Instrument: [1:Minimum] [N/A:N/A] Bleeding: [1:Pressure] [N/A:N/A] Hemostasis A chieved: [1:0] [N/A:N/A] Procedural Pain: [1:0] [N/A:N/A] Post Procedural Pain: [1:Procedure was tolerated well] [N/A:N/A] Debridement Treatment Response: [1:3.4x4x0.6] [N/A:N/A] Post Debridement Measurements L x W x D (cm) [1:6.409] [N/A:N/A] Post Debridement Volume: (cm) [1:Unstageable/Unclassified] [N/A:N/A] Post  Debridement Stage: [1:Debridement] [N/A:N/A] Treatment Notes Wound #1 (Left Calcaneus) 1. Cleanse With Wound Cleanser 3. Primary Dressing Applied Santyl 4. Secondary Dressing Dry Gauze Heel Cup Other secondary dressing (specify in notes) 5. Secured With Tape Notes moistened saline gauze over santyl Electronic Signature(s) Signed: 12/29/2019 6:01:01 PM By: Deon Pilling Signed: 12/30/2019 8:05:25 AM By: [name] MD Entered By: [name] on 12/29/2019 12:57:45 -------------------------------------------------------------------------------- Multi-Disciplinary Care Plan Details Patient Name: Date of Service: Vernon Frye, Natan L. 12/29/2019 11:00 A M Medical Record Number: [id number] Patient Account Number: [id number] Date of Birth/Sex: Treating RN: 03-21-1961 (59 y.o. Hessie Diener Primary Care Attie Nawabi: Nicholes Rough Other Clinician: Referring Baneza Bartoszek: Treating Mervin Ramires/Extender: Anselm Pancoast in Treatment: 10 Active Inactive Wound/Skin Impairment Nursing Diagnoses: Knowledge deficit related to ulceration/compromised skin integrity Goals: Patient/caregiver will verbalize understanding of skin care regimen Date Initiated: 10/18/2019 Target Resolution Date: 01/27/2020 Goal Status: Active Ulcer/skin breakdown will have a volume reduction of 30% by week 4 Date Initiated: 10/18/2019 Date Inactivated: 11/22/2019 Target Resolution Date: 11/18/2019 Goal Status: Unmet Unmet Reason: comorbities Ulcer/skin breakdown will have a volume reduction of 50% by week 8 Date Initiated: 11/22/2019 Date Inactivated: 12/22/2019 Target Resolution Date: 12/16/2019 Unmet Reason: volume has not Goal Status: Unmet reduced see wound measurements from week 8. Interventions: Assess patient/caregiver ability to obtain necessary supplies Assess patient/caregiver ability to perform ulcer/skin care regimen upon admission and as needed Assess ulceration(s) every  visit Notes: Electronic Signature(s) Signed: 12/29/2019 6:01:01 PM By: Deon Pilling Entered By: Deon Pilling on 12/29/2019 11:57:59 -------------------------------------------------------------------------------- Pain Assessment Details Patient Name: Date of Service: Vernon Frye, Mak L. 12/29/2019 11:00 A M Medical Record Number: [id number] Patient Account Number: [id number] Date of Birth/Sex: Treating RN: 12/28/1960 (58 y.o. Hessie Diener Primary Care Sandralee Tarkington: Nicholes Rough Other Clinician: Referring Ivie Savitt: Treating Jakim Drapeau/Extender: Anselm Pancoast in Treatment: 10 Active Problems Location of Pain Severity and Description of Pain Patient Has Paino Yes Site Locations Rate the pain. Current Pain Level: 8 Pain Management and Medication Current Pain Management: Electronic Signature(s) Signed: 12/29/2019 6:01:01 PM By: Deon Pilling Signed: 01/02/2020 1:20:36 PM By: Sandre Kitty Entered By: Sandre Kitty on 12/29/2019 11:30:14 -------------------------------------------------------------------------------- Patient/Caregiver Education Details Patient Name: Date of Service: Vernon Frye, Bernal L. 6/3/2021andnbsp11:00 A M Medical Record Number: [id number] Patient Account Number: [id number] Date of Birth/Gender: Treating RN: February 14, 1961 (58 y.o. Hessie Diener Primary Care Physician: Nicholes Rough Other Clinician: Referring Physician: Treating Physician/Extender: Anselm Pancoast in Treatment: 10 Education Assessment Education Provided To: Patient Education Topics Provided Wound/Skin Impairment: Handouts: Skin Care Do's and Dont's Methods: Explain/Verbal Responses: Reinforcements needed Electronic Signature(s) Signed: 12/29/2019 6:01:01 PM By: Deon Pilling Entered By: Deon Pilling on 12/29/2019 11:58:12 -------------------------------------------------------------------------------- Wound Assessment Details Patient  Name: Date  of Service: Vernon Frye, Boyde L. 12/29/2019 11:00 A M Medical Record Number: [id number] Patient Account Number: [id number] Date of Birth/Sex: Treating RN: 11-Jan-1961 (59 y.o. Hessie Diener Primary Care Ceaser Ebeling: Nicholes Rough Other Clinician: Referring Ngozi Alvidrez: Treating Mechille Varghese/Extender: Anselm Pancoast in Treatment: 10 Wound Status Wound Number: 1 Primary Etiology: Pressure Ulcer Wound Location: Left Calcaneus Wound Status: Open Wounding Event: Pressure Injury Comorbid History: Sleep Apnea, Arrhythmia, Hypertension Date Acquired: 07/17/2019 Weeks Of Treatment: 10 Clustered Wound: No Photos Photo Uploaded By: Mikeal Hawthorne on 01/02/2020 11:48:58 Wound Measurements Length: (cm) 3.4 Width: (cm) 4 Depth: (cm) 0.6 Area: (cm) 10.681 Volume: (cm) 6.409 % Reduction in Area: 29.7% % Reduction in Volume: -321.6% Epithelialization: Small (1-33%) Tunneling: No Undermining: No Wound Description Classification: Unstageable/Unclassified Wound Margin: Flat and Intact Exudate Amount: Medium Exudate Type: Serosanguineous Exudate Color: red, brown Foul Odor After Cleansing: No Slough/Fibrino Yes Wound Bed Granulation Amount: Small (1-33%) Exposed Structure Granulation Quality: Pink Fascia Exposed: No Necrotic Amount: Large (67-100%) Fat Layer (Subcutaneous Tissue) Exposed: Yes Necrotic Quality: Adherent Slough Tendon Exposed: No Muscle Exposed: No Joint Exposed: No Bone Exposed: No Treatment Notes Wound #1 (Left Calcaneus) 1. Cleanse With Wound Cleanser 3. Primary Dressing Applied Santyl 4. Secondary Dressing Dry Gauze Heel Cup Other secondary dressing (specify in notes) 5. Secured With Tape Notes moistened saline gauze over santyl Electronic Signature(s) Signed: 12/29/2019 6:01:01 PM By: Deon Pilling Signed: 01/02/2020 6:28:13 PM By: Levan Hurst RN, BSN Entered By: Levan Hurst on 12/29/2019  11:42:02 -------------------------------------------------------------------------------- Vitals Details Patient Name: Date of Service: Vernon Frye, Paiden L. 12/29/2019 11:00 A M Medical Record Number: [id number] Patient Account Number: [id number] Date of Birth/Sex: Treating RN: [birth year] (59 y.o. Hessie Diener Primary Care Lenzy Kerschner: Nicholes Rough Other Clinician: Referring Emaly Boschert: Treating Bobbye Petti/Extender: Anselm Pancoast in Treatment: 10 Vital Signs Time Taken: 11:29 Temperature (F): 98.3 Height (in): 75 Pulse (bpm): 60 Weight (lbs): 240 Respiratory Rate (breaths/min): 18 Body Mass Index (BMI): 30 Blood Pressure (mmHg): 170/90 Reference Range: 80 - 120 mg / dl Electronic Signature(s) Signed: 01/02/2020 1:20:36 PM By: Sandre Kitty Entered By: Sandre Kitty on 12/29/2019 11:29:55

## 2020-01-03 ENCOUNTER — Encounter (HOSPITAL_BASED_OUTPATIENT_CLINIC_OR_DEPARTMENT_OTHER): Payer: Self-pay | Admitting: Internal Medicine
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
## 2020-01-05 NOTE — Progress Notes (Signed)
ZAYLIN, RUNCO ([id number]) Visit Report for 01/03/2020 Arrival Information Details Patient Name: Date of Service: Vernon Frye, Vernon L. 01/03/2020 11:00 A M Medical Record Number: [id number] Patient Account Number: [id number] Date of Birth/Sex: Treating RN: 1961-02-16 (59 y.o. Marvis Repress Primary Care Keili Hasten: Nicholes Rough Other Clinician: Referring Armanie Martine: Treating Alexandre Faries/Extender: Anselm Pancoast in Treatment: 11 Visit Information History Since Last Visit Added or deleted any medications: No Patient Arrived: Wheel Chair Any new allergies or adverse reactions: No Arrival Time: 11:02 Had a fall or experienced change in No Accompanied By: self activities of daily living that may affect Transfer Assistance: None risk of falls: Patient Identification Verified: Yes Signs or symptoms of abuse/neglect since last visito No Secondary Verification Process Completed: Yes Hospitalized since last visit: No Patient Requires Transmission-Based Precautions: No Implantable device outside of the clinic excluding No Patient Has Alerts: No cellular tissue based products placed in the center since last visit: Has Dressing in Place as Prescribed: Yes Pain Present Now: Yes Electronic Signature(s) Signed: 01/03/2020 4:53:58 PM By: Kela Millin Entered By: Kela Millin on 01/03/2020 11:03:02 -------------------------------------------------------------------------------- Encounter Discharge Information Details Patient Name: Date of Service: Vernon Frye, Vernon L. 01/03/2020 11:00 A M Medical Record Number: [id number] Patient Account Number: [id number] Date of Birth/Sex: Treating RN: [birth year] (59 y.o. [name] Primary Care Dorthia Tout: Nicholes Rough Other Clinician: Referring Travoris Bushey: Treating Levetta Bognar/Extender: Anselm Pancoast in Treatment: 11 Encounter Discharge Information Items Post Procedure Vitals Discharge Condition:  Stable Temperature (F): 98.4 Ambulatory Status: Wheelchair Pulse (bpm): 73 Discharge Destination: Home Respiratory Rate (breaths/min): 18 Transportation: Private Auto Blood Pressure (mmHg): 163/91 Accompanied By: alone Schedule Follow-up Appointment: Yes Clinical Summary of Care: Patient Declined Electronic Signature(s) Signed: 01/05/2020 5:18:16 PM By: Levan Hurst RN, BSN Entered By: Levan Hurst on 01/03/2020 12:23:49 -------------------------------------------------------------------------------- Lower Extremity Assessment Details Patient Name: Date of Service: Vernon Frye, Vernon L. 01/03/2020 11:00 A M Medical Record Number: [id number] Patient Account Number: [id number] Date of Birth/Sex: Treating RN: 09/17/1960 (59 y.o. Marvis Repress Primary Care Andrea Colglazier: Nicholes Rough Other Clinician: Referring Chade Pitner: Treating Shaurya Rawdon/Extender: Anselm Pancoast in Treatment: 11 Edema Assessment Assessed: Shirlyn Goltz: No] Patrice Paradise: No] Edema: [Left: Ye] [Right: s] Calf Left: Right: Point of Measurement: cm From Medial Instep 41 cm cm Ankle Left: Right: Point of Measurement: cm From Medial Instep 30 cm cm Vascular Assessment Pulses: Dorsalis Pedis Palpable: [Left:Yes] Electronic Signature(s) Signed: 01/03/2020 4:53:58 PM By: Kela Millin Entered By: Kela Millin on 01/03/2020 11:04:45 -------------------------------------------------------------------------------- Multi Wound Chart Details Patient Name: Date of Service: Vernon Frye, Vernon L. 01/03/2020 11:00 A M Medical Record Number: [id number] Patient Account Number: [id number] Date of Birth/Sex: Treating RN: Apr 20, 1961 (58 y.o. [name]) [name] Primary Care Joaquina Nissen: Nicholes Rough Other Clinician: Referring Lissie Hinesley: Treating Tymika Grilli/Extender: Anselm Pancoast in Treatment: 11 Vital Signs Height(in): 75 Pulse(bpm): 73 Weight(lbs): 240 Blood Pressure(mmHg): 163/91 Body Mass  Index(BMI): 30 Temperature(F): 98.4 Respiratory Rate(breaths/min): 18 Photos: [1:No Photos Left Calcaneus] [N/A:N/A N/A] Wound Location: [1:Pressure Injury] [N/A:N/A] Wounding Event: [1:Pressure Ulcer] [N/A:N/A] Primary Etiology: [1:Sleep Apnea, Arrhythmia,] [N/A:N/A] Comorbid History: [1:Hypertension 07/17/2019] [N/A:N/A] Date Acquired: [1:11] [N/A:N/A] Weeks of Treatment: [1:Open] [N/A:N/A] Wound Status: [1:4.2x4.1x0.6] [N/A:N/A] Measurements L x W x D (cm) [1:13.525] [N/A:N/A] A (cm) : rea [1:8.115] [N/A:N/A] Volume (cm) : [1:11.00%] [N/A:N/A] % Reduction in A rea: [1:-433.90%] [N/A:N/A] % Reduction in Volume: [1:Unstageable/Unclassified] [N/A:N/A] Classification: [1:Medium] [N/A:N/A] Exudate A mount: [1:Serosanguineous] [N/A:N/A] Exudate Type: [1:red, brown] [N/A:N/A] Exudate Color: [1:Well defined,  not attached] [N/A:N/A] Wound Margin: [1:Small (1-33%)] [N/A:N/A] Granulation A mount: [1:Pink] [N/A:N/A] Granulation Quality: [1:Large (67-100%)] [N/A:N/A] Necrotic A mount: [1:Fat Layer (Subcutaneous Tissue)] [N/A:N/A] Exposed Structures: [1:Exposed: Yes Fascia: No Tendon: No Muscle: No Joint: No Bone: No Small (1-33%)] [N/A:N/A] Epithelialization: [1:Debridement - Excisional] [N/A:N/A] Debridement: Pre-procedure Verification/Time Out 11:57 [N/A:N/A] Taken: [1:Lidocaine 5% topical ointment] [N/A:N/A] Pain Control: [1:Subcutaneous, Slough] [N/A:N/A] Tissue Debrided: [1:Skin/Subcutaneous Tissue] [N/A:N/A] Level: [1:17.22] [N/A:N/A] Debridement A (sq cm): [1:rea Curette] [N/A:N/A] Instrument: [1:Moderate] [N/A:N/A] Bleeding: [1:Pressure] [N/A:N/A] Hemostasis A chieved: [1:0] [N/A:N/A] Procedural Pain: [1:0] [N/A:N/A] Post Procedural Pain: [1:Procedure was tolerated well] [N/A:N/A] Debridement Treatment Response: [1:4.2x4.1x0.6] [N/A:N/A] Post Debridement Measurements L x W x D (cm) [1:8.115] [N/A:N/A] Post Debridement Volume: (cm) [1:Unstageable/Unclassified]  [N/A:N/A] Post Debridement Stage: [1:Debridement] [N/A:N/A] Treatment Notes Wound #1 (Left Calcaneus) 1. Cleanse With Wound Cleanser 3. Primary Dressing Applied Santyl 4. Secondary Dressing Dry Gauze Roll Gauze Heel Cup Other secondary dressing (specify in notes) 5. Secured With Tape Notes moistened saline gauze over santyl Electronic Signature(s) Signed: 01/03/2020 4:34:33 PM By: [name] RN Signed: 01/04/2020 4:17:30 PM By: [name] MD Entered By: [name] on 01/03/2020 12:31:02 -------------------------------------------------------------------------------- Multi-Disciplinary Care Plan Details Patient Name: Date of Service: Vernon Frye, [place]. 01/03/2020 11:00 A M Medical Record Number: [id number] Patient Account Number: [id number] Date of Birth/Sex: Treating RN: 1960-10-20 (58 y.o. [name] Primary Care Aracelly Tencza: Nicholes Rough Other Clinician: Referring Fermon Ureta: Treating Jackeline Gutknecht/Extender: Anselm Pancoast in Treatment: 11 Active Inactive Wound/Skin Impairment Nursing Diagnoses: Knowledge deficit related to ulceration/compromised skin integrity Goals: Patient/caregiver will verbalize understanding of skin care regimen Date Initiated: 10/18/2019 Target Resolution Date: 01/27/2020 Goal Status: Active Ulcer/skin breakdown will have a volume reduction of 30% by week 4 Date Initiated: 10/18/2019 Date Inactivated: 11/22/2019 Target Resolution Date: 11/18/2019 Goal Status: Unmet Unmet Reason: comorbities Ulcer/skin breakdown will have a volume reduction of 50% by week 8 Date Initiated: 11/22/2019 Date Inactivated: 12/22/2019 Target Resolution Date: 12/16/2019 Unmet Reason: volume has not Goal Status: Unmet reduced see wound measurements from week 8. Interventions: Assess patient/caregiver ability to obtain necessary supplies Assess patient/caregiver ability to perform ulcer/skin care regimen upon admission and as needed Assess  ulceration(s) every visit Notes: Electronic Signature(s) Signed: 01/03/2020 4:34:33 PM By: [name] RN Entered By: [name] on 01/03/2020 11:49:53 -------------------------------------------------------------------------------- Pain Assessment Details Patient Name: Date of Service: Vernon Frye, Arion L. 01/03/2020 11:00 A M Medical Record Number: [id number] Patient Account Number: [id number] Date of Birth/Sex: Treating RN: 1960-10-15 (59 y.o. Marvis Repress Primary Care Elby Blackwelder: Nicholes Rough Other Clinician: Referring Kinnie Kaupp: Treating Seara Hinesley/Extender: Anselm Pancoast in Treatment: 11 Active Problems Location of Pain Severity and Description of Pain Patient Has Paino Yes Site Locations Pain Location: Pain in Ulcers With Dressing Change: Yes Duration of the Pain. Constant / Intermittento Constant Rate the pain. Current Pain Level: 6 Worst Pain Level: 8 Least Pain Level: 5 Tolerable Pain Level: 5 Character of Pain Describe the Pain: Aching, Burning, Stabbing Pain Management and Medication Current Pain Management: Electronic Signature(s) Signed: 01/03/2020 4:53:58 PM By: Kela Millin Entered By: Kela Millin on 01/03/2020 11:06:14 -------------------------------------------------------------------------------- Patient/Caregiver Education Details Patient Name: Date of Service: Vernon Frye, [place] 6/8/2021andnbsp11:00 A M Medical Record Number: [id number] Patient Account Number: [id number] Date of Birth/Gender: Treating RN: Dec 14, 1960 (58 y.o. [name] Primary Care Physician: Nicholes Rough Other Clinician: Referring Physician: Treating Physician/Extender: Anselm Pancoast in Treatment: 11 Education Assessment Education Provided To: Patient Education Topics Provided Wound/Skin  Impairment: Methods: Explain/Verbal Responses: State content correctly Electronic Signature(s) Signed: 01/03/2020 4:34:33 PM By:  [name] RN Entered By: [name] on 01/03/2020 11:50:13 -------------------------------------------------------------------------------- Wound Assessment Details Patient Name: Date of Service: Vernon Frye, Freman L. 01/03/2020 11:00 A M Medical Record Number: [id number] Patient Account Number: [id number] Date of Birth/Sex: Treating RN: 1960/11/28 (59 y.o. Marvis Repress Primary Care Cameron Katayama: Nicholes Rough Other Clinician: Referring Freddrick Gladson: Treating Dempsy Damiano/Extender: Anselm Pancoast in Treatment: 11 Wound Status Wound Number: 1 Primary Etiology: Pressure Ulcer Wound Location: Left Calcaneus Wound Status: Open Wounding Event: Pressure Injury Comorbid History: Sleep Apnea, Arrhythmia, Hypertension Date Acquired: 07/17/2019 Weeks Of Treatment: 11 Clustered Wound: No Photos Photo Uploaded By: Mikeal Hawthorne on 01/05/2020 14:38:38 Wound Measurements Length: (cm) 4.2 Width: (cm) 4.1 Depth: (cm) 0.6 Area: (cm) 13.525 Volume: (cm) 8.115 % Reduction in Area: 11% % Reduction in Volume: -433.9% Epithelialization: Small (1-33%) Undermining: No Wound Description Classification: Unstageable/Unclassified Wound Margin: Well defined, not attached Exudate Amount: Medium Exudate Type: Serosanguineous Exudate Color: red, brown Foul Odor After Cleansing: No Slough/Fibrino Yes Wound Bed Granulation Amount: Small (1-33%) Exposed Structure Granulation Quality: Pink Fascia Exposed: No Necrotic Amount: Large (67-100%) Fat Layer (Subcutaneous Tissue) Exposed: Yes Necrotic Quality: Adherent Slough Tendon Exposed: No Muscle Exposed: No Joint Exposed: No Bone Exposed: No Treatment Notes Wound #1 (Left Calcaneus) 1. Cleanse With Wound Cleanser 3. Primary Dressing Applied Santyl 4. Secondary Dressing Dry Gauze Roll Gauze Heel Cup Other secondary dressing (specify in notes) 5. Secured With Tape Notes moistened saline gauze over santyl Electronic  Signature(s) Signed: 01/03/2020 4:53:58 PM By: Kela Millin Entered By: Kela Millin on 01/03/2020 11:08:27 -------------------------------------------------------------------------------- Vitals Details Patient Name: Date of Service: Vernon Frye, [place] L. 01/03/2020 11:00 A M Medical Record Number: [id number] Patient Account Number: [id number] Date of Birth/Sex: Treating RN: 1961/01/02 (59 y.o. Marvis Repress Primary Care Cadyn Fann: Nicholes Rough Other Clinician: Referring Jazmine Heckman: Treating Admir Candelas/Extender: Anselm Pancoast in Treatment: 11 Vital Signs Time Taken: 11:00 Temperature (F): 98.4 Height (in): 75 Pulse (bpm): 73 Weight (lbs): 240 Respiratory Rate (breaths/min): 18 Body Mass Index (BMI): 30 Blood Pressure (mmHg): 163/91 Reference Range: 80 - 120 mg / dl Electronic Signature(s) Signed: 01/03/2020 4:53:58 PM By: Kela Millin Entered By: Kela Millin on 01/03/2020 11:05:30

## 2020-01-10 ENCOUNTER — Other Ambulatory Visit: Payer: Self-pay

## 2020-01-10 ENCOUNTER — Encounter (HOSPITAL_BASED_OUTPATIENT_CLINIC_OR_DEPARTMENT_OTHER): Payer: Self-pay | Admitting: Internal Medicine

## 2020-01-11 NOTE — Progress Notes (Signed)
VERNON, MAISH ([id number]) Visit Report for 01/10/2020 Arrival Information Details Patient Name: Date of Service: Silvio Pate, Joangel L. 01/10/2020 10:00 A M Medical Record Number: [id number] Patient Account Number: [id number] Date of Birth/Sex: Treating RN: August 17, 1960 (59 y.o. Ernestene Mention Primary Care Adeoluwa Silvers: Nicholes Rough Other Clinician: Referring Dazani Norby: Treating Linde Wilensky/Extender: Anselm Pancoast in Treatment: 12 Visit Information History Since Last Visit Added or deleted any medications: No Patient Arrived: Wheel Chair Any new allergies or adverse reactions: No Arrival Time: 10:35 Had a fall or experienced change in No Accompanied By: self activities of daily living that may affect Transfer Assistance: None risk of falls: Patient Identification Verified: Yes Signs or symptoms of abuse/neglect since last visito No Secondary Verification Process Completed: Yes Hospitalized since last visit: No Patient Requires Transmission-Based Precautions: No Implantable device outside of the clinic excluding No Patient Has Alerts: No cellular tissue based products placed in the center since last visit: Has Dressing in Place as Prescribed: Yes Pain Present Now: Yes Electronic Signature(s) Signed: 01/10/2020 5:45:30 PM By: Baruch Gouty RN, BSN Entered By: Baruch Gouty on 01/10/2020 10:39:13 -------------------------------------------------------------------------------- Clinic Level of Care Assessment Details Patient Name: Date of Service: Silvio Pate, Merrik L. 01/10/2020 10:00 A M Medical Record Number: [id number] Patient Account Number: [id number] Date of Birth/Sex: Treating RN: April 26, 1961 (58 y.o. [name]) [name] Primary Care Saif Peter: Nicholes Rough Other Clinician: Referring Karenann Mcgrory: Treating Jaonna Word/Extender: Anselm Pancoast in Treatment: 12 Clinic Level of Care Assessment Items TOOL 4 Quantity Score X- 1 0 Use when only an EandM  is performed on FOLLOW-UP visit ASSESSMENTS - Nursing Assessment / Reassessment X- 1 10 Reassessment of Co-morbidities (includes updates in patient status) X- 1 5 Reassessment of Adherence to Treatment Plan ASSESSMENTS - Wound and Skin A ssessment / Reassessment X - Simple Wound Assessment / Reassessment - one wound 1 5 []  - 0 Complex Wound Assessment / Reassessment - multiple wounds []  - 0 Dermatologic / Skin Assessment (not related to wound area) ASSESSMENTS - Focused Assessment []  - 0 Circumferential Edema Measurements - multi extremities []  - 0 Nutritional Assessment / Counseling / Intervention []  - 0 Lower Extremity Assessment (monofilament, tuning fork, pulses) []  - 0 Peripheral Arterial Disease Assessment (using hand held doppler) ASSESSMENTS - Ostomy and/or Continence Assessment and Care []  - 0 Incontinence Assessment and Management []  - 0 Ostomy Care Assessment and Management (repouching, etc.) PROCESS - Coordination of Care X - Simple Patient / Family Education for ongoing care 1 15 []  - 0 Complex (extensive) Patient / Family Education for ongoing care X- 1 10 Staff obtains Programmer, systems, Records, T Results / Process Orders est []  - 0 Staff telephones HHA, Nursing Homes / Clarify orders / etc []  - 0 Routine Transfer to another Facility (non-emergent condition) []  - 0 Routine Hospital Admission (non-emergent condition) X- 1 15 New Admissions / Biomedical engineer / Ordering NPWT Apligraf, etc. , []  - 0 Emergency Hospital Admission (emergent condition) X- 1 10 Simple Discharge Coordination []  - 0 Complex (extensive) Discharge Coordination PROCESS - Special Needs []  - 0 Pediatric / Minor Patient Management []  - 0 Isolation Patient Management []  - 0 Hearing / Language / Visual special needs []  - 0 Assessment of Community assistance (transportation, D/C planning, etc.) []  - 0 Additional assistance / Altered mentation []  - 0 Support Surface(s)  Assessment (bed, cushion, seat, etc.) INTERVENTIONS - Wound Cleansing / Measurement X - Simple Wound Cleansing - one wound 1 5 []  - 0 Complex  Wound Cleansing - multiple wounds X- 1 5 Wound Imaging (photographs - any number of wounds) []  - 0 Wound Tracing (instead of photographs) X- 1 5 Simple Wound Measurement - one wound []  - 0 Complex Wound Measurement - multiple wounds INTERVENTIONS - Wound Dressings []  - 0 Small Wound Dressing one or multiple wounds []  - 0 Medium Wound Dressing one or multiple wounds X- 1 20 Large Wound Dressing one or multiple wounds X- 1 5 Application of Medications - topical []  - 0 Application of Medications - injection INTERVENTIONS - Miscellaneous []  - 0 External ear exam []  - 0 Specimen Collection (cultures, biopsies, blood, body fluids, etc.) []  - 0 Specimen(s) / Culture(s) sent or taken to Lab for analysis []  - 0 Patient Transfer (multiple staff / Civil Service fast streamer / Similar devices) []  - 0 Simple Staple / Suture removal (25 or less) []  - 0 Complex Staple / Suture removal (26 or more) []  - 0 Hypo / Hyperglycemic Management (close monitor of Blood Glucose) []  - 0 Ankle / Brachial Index (ABI) - do not check if billed separately X- 1 5 Vital Signs Has the patient been seen at the hospital within the last three years: Yes Total Score: 115 Level Of Care: New/Established - Level 3 Electronic Signature(s) Signed: 01/11/2020 9:54:21 AM By: [name] RN Entered By: [name] on 01/10/2020 11:27:54 -------------------------------------------------------------------------------- Encounter Discharge Information Details Patient Name: Date of Service: Silvio Pate, Caprice L. 01/10/2020 10:00 A M Medical Record Number: [id number] Patient Account Number: [id number] Date of Birth/Sex: Treating RN: [birth year] (59 y.o. Marvis Repress Primary Care Graig Hessling: Nicholes Rough Other Clinician: Referring Vito Beg: Treating Kazuko Clemence/Extender: Anselm Pancoast in Treatment: 12 Encounter Discharge Information Items Discharge Condition: Stable Ambulatory Status: Unstable Discharge Destination: Home Transportation: Other Accompanied By: caseworker Schedule Follow-up Appointment: Yes Clinical Summary of Care: Patient Declined Electronic Signature(s) Signed: 01/10/2020 5:39:21 PM By: Kela Millin Entered By: Kela Millin on 01/10/2020 11:55:18 -------------------------------------------------------------------------------- Lower Extremity Assessment Details Patient Name: Date of Service: Silvio Pate, Jacobb L. 01/10/2020 10:00 A M Medical Record Number: [id number] Patient Account Number: [id number] Date of Birth/Sex: Treating RN: 24-Aug-1960 (59 y.o. Ernestene Mention Primary Care Clement Deneault: Nicholes Rough Other Clinician: Referring Ellie Spickler: Treating Errol Ala/Extender: Anselm Pancoast in Treatment: 12 Edema Assessment Assessed: Shirlyn Goltz: No] Patrice Paradise: No] Edema: [Left: Ye] [Right: s] Calf Left: Right: Point of Measurement: cm From Medial Instep 39 cm cm Ankle Left: Right: Point of Measurement: cm From Medial Instep 30.5 cm cm Vascular Assessment Pulses: Dorsalis Pedis Palpable: [Left:Yes] Electronic Signature(s) Signed: 01/10/2020 5:45:30 PM By: Baruch Gouty RN, BSN Entered By: Baruch Gouty on 01/10/2020 10:43:15 -------------------------------------------------------------------------------- Multi Wound Chart Details Patient Name: Date of Service: Silvio Pate, Jobie L. 01/10/2020 10:00 A M Medical Record Number: [id number] Patient Account Number: [id number] Date of Birth/Sex: Treating RN: 1961/07/09 (58 y.o. [name] Primary Care Osmany Azer: Nicholes Rough Other Clinician: Referring Prince Olivier: Treating Michaella Imai/Extender: Anselm Pancoast in Treatment: 12 Vital Signs Height(in): 75 Pulse(bpm): 64 Weight(lbs): 240 Blood Pressure(mmHg): 124/90 Body Mass  Index(BMI): 30 Temperature(F): 97.4 Respiratory Rate(breaths/min): 18 Photos: [1:No Photos Left Calcaneus] [N/A:N/A N/A] Wound Location: [1:Pressure Injury] [N/A:N/A] Wounding Event: [1:Pressure Ulcer] [N/A:N/A] Primary Etiology: [1:Sleep Apnea, Arrhythmia,] [N/A:N/A] Comorbid History: [1:Hypertension 07/17/2019] [N/A:N/A] Date Acquired: [1:12] [N/A:N/A] Weeks of Treatment: [1:Open] [N/A:N/A] Wound Status: [1:4.4x3.8x0.9] [N/A:N/A] Measurements L x W x D (cm) [1:13.132] [N/A:N/A] A (cm) : rea [1:11.819] [N/A:N/A] Volume (cm) : [1:13.60%] [N/A:N/A] % Reduction in A [1:rea: -677.60%] [  N/A:N/A] % Reduction in Volume: [1:Category/Stage III] [N/A:N/A] Classification: [1:Medium] [N/A:N/A] Exudate A mount: [1:Serosanguineous] [N/A:N/A] Exudate Type: [1:red, brown] [N/A:N/A] Exudate Color: [1:Well defined, not attached] [N/A:N/A] Wound Margin: [1:Small (1-33%)] [N/A:N/A] Granulation A mount: [1:Pink] [N/A:N/A] Granulation Quality: [1:Large (67-100%)] [N/A:N/A] Necrotic A mount: [1:Fat Layer (Subcutaneous Tissue)] [N/A:N/A] Exposed Structures: [1:Exposed: Yes Fascia: No Tendon: No Muscle: No Joint: No Bone: No Small (1-33%)] [N/A:N/A] Epithelialization: [1:Debridement - Excisional] [N/A:N/A] Debridement: Pre-procedure Verification/Time Out 11:35 [N/A:N/A] Taken: [1:Lidocaine 4% Topical Solution] [N/A:N/A] Pain Control: [1:Subcutaneous] [N/A:N/A] Tissue Debrided: [1:Skin/Subcutaneous Tissue] [N/A:N/A] Level: [1:16.72] [N/A:N/A] Debridement A (sq cm): [1:rea Curette] [N/A:N/A] Instrument: [1:Moderate] [N/A:N/A] Bleeding: [1:Pressure] [N/A:N/A] Hemostasis A chieved: [1:0] [N/A:N/A] Procedural Pain: [1:0] [N/A:N/A] Post Procedural Pain: [1:Procedure was tolerated well] [N/A:N/A] Debridement Treatment Response: [1:4.4x3.8x0.9] [N/A:N/A] Post Debridement Measurements L x W x D (cm) [1:11.819] [N/A:N/A] Post Debridement Volume: (cm) [1:Category/Stage III] [N/A:N/A] Post Debridement  Stage: [1:Debridement] [N/A:N/A] Treatment Notes Wound #1 (Left Calcaneus) 1. Cleanse With Wound Cleanser 2. Periwound Care Skin Prep 3. Primary Dressing Applied Iodoflex 4. Secondary Dressing Dry Gauze Roll Gauze Heel Cup 5. Secured With Recruitment consultant) Signed: 01/10/2020 5:55:30 PM By: [name] MD Signed: 01/11/2020 9:54:21 AM By: [name] RN Entered By: [name] on 01/10/2020 12:12:16 -------------------------------------------------------------------------------- Multi-Disciplinary Care Plan Details Patient Name: Date of Service: Silvio Pate, Jael L. 01/10/2020 10:00 A M Medical Record Number: [id number] Patient Account Number: [id number] Date of Birth/Sex: Treating RN: 05/17/1961 (58 y.o. [name] Primary Care Keilen Kahl: Nicholes Rough Other Clinician: Referring Jenavee Laguardia: Treating Sem Mccaughey/Extender: Anselm Pancoast in Treatment: 12 Active Inactive Wound/Skin Impairment Nursing Diagnoses: Knowledge deficit related to ulceration/compromised skin integrity Goals: Patient/caregiver will verbalize understanding of skin care regimen Date Initiated: 10/18/2019 Target Resolution Date: 01/27/2020 Goal Status: Active Ulcer/skin breakdown will have a volume reduction of 30% by week 4 Date Initiated: 10/18/2019 Date Inactivated: 11/22/2019 Target Resolution Date: 11/18/2019 Goal Status: Unmet Unmet Reason: comorbities Ulcer/skin breakdown will have a volume reduction of 50% by week 8 Date Initiated: 11/22/2019 Date Inactivated: 12/22/2019 Target Resolution Date: 12/16/2019 Unmet Reason: volume has not Goal Status: Unmet reduced see wound measurements from week 8. Interventions: Assess patient/caregiver ability to obtain necessary supplies Assess patient/caregiver ability to perform ulcer/skin care regimen upon admission and as needed Assess ulceration(s) every visit Notes: Electronic Signature(s) Signed: 01/11/2020 9:54:21 AM  By: [name] RN Entered By: [name] on 01/10/2020 11:25:22 -------------------------------------------------------------------------------- Pain Assessment Details Patient Name: Date of Service: Silvio Pate, Anas L. 01/10/2020 10:00 A M Medical Record Number: [id number] Patient Account Number: [id number] Date of Birth/Sex: Treating RN: 1961/03/26 (59 y.o. Ernestene Mention Primary Care Marciana Uplinger: Nicholes Rough Other Clinician: Referring Eulon Allnutt: Treating Adriell Polansky/Extender: Anselm Pancoast in Treatment: 12 Active Problems Location of Pain Severity and Description of Pain Patient Has Paino Yes Site Locations Pain Location: Generalized Pain, Pain in Ulcers With Dressing Change: Yes Duration of the Pain. Constant / Intermittento Constant Rate the pain. Current Pain Level: 9 Worst Pain Level: 10 Least Pain Level: 7 Character of Pain Describe the Pain: Aching, Throbbing Pain Management and Medication Current Pain Management: Medication: Yes Is the Current Pain Management Adequate: Adequate How does your wound impact your activities of daily livingo Sleep: Yes Bathing: No Appetite: No Relationship With Others: No Bladder Continence: No Emotions: Yes Bowel Continence: No Hobbies: No Toileting: No Dressing: No Notes reports pain in left knee, left heel and constant toothache Electronic Signature(s) Signed: 01/10/2020 5:45:30 PM By: Baruch Gouty RN, BSN Entered  By: Baruch Gouty on 01/10/2020 10:43:00 -------------------------------------------------------------------------------- Patient/Caregiver Education Details Patient Name: Date of Service: Silvio Pate, [place] 6/15/2021andnbsp10:00 [place] Record Number: [id number] Patient Account Number: [id number] Date of Birth/Gender: Treating RN: November 24, 1960 (58 y.o. [name] Primary Care Physician: Nicholes Rough Other Clinician: Referring Physician: Treating Physician/Extender: Anselm Pancoast in Treatment: 12 Education Assessment Education Provided To: Patient Education Topics Provided Wound/Skin Impairment: Methods: Explain/Verbal Responses: State content correctly [employer]) Signed: 01/11/2020 9:54:21 AM By: [name] RN Entered By: [name] on 01/10/2020 11:25:42 -------------------------------------------------------------------------------- Wound Assessment Details Patient Name: Date of Service: Silvio Pate, Kalman L. 01/10/2020 10:00 A M Medical Record Number: [id number] Patient Account Number: [id number] Date of Birth/Sex: Treating RN: November 20, 1960 (59 y.o. Ernestene Mention Primary Care Elza Varricchio: Nicholes Rough Other Clinician: Referring Rondel Episcopo: Treating Evone Arseneau/Extender: Anselm Pancoast in Treatment: 12 Wound Status Wound Number: 1 Primary Etiology: Pressure Ulcer Wound Location: Left Calcaneus Wound Status: Open Wounding Event: Pressure Injury Comorbid History: Sleep Apnea, Arrhythmia, Hypertension Date Acquired: 07/17/2019 Weeks Of Treatment: 12 Clustered Wound: No Wound Measurements Length: (cm) 4.4 Width: (cm) 3.8 Depth: (cm) 0.9 Area: (cm) 13.132 Volume: (cm) 11.819 % Reduction in Area: 13.6% % Reduction in Volume: -677.6% Epithelialization: Small (1-33%) Tunneling: No Undermining: No Wound Description Classification: Category/Stage III Wound Margin: Well defined, not attached Exudate Amount: Medium Exudate Type: Serosanguineous Exudate Color: red, brown Foul Odor After Cleansing: No Slough/Fibrino Yes Wound Bed Granulation Amount: Small (1-33%) Exposed Structure Granulation Quality: Pink Fascia Exposed: No Necrotic Amount: Large (67-100%) Fat Layer (Subcutaneous Tissue) Exposed: Yes Necrotic Quality: Adherent Slough Tendon Exposed: No Muscle Exposed: No Joint Exposed: No Bone Exposed: No Treatment Notes Wound #1 (Left Calcaneus) 1. Cleanse With Wound  Cleanser 2. Periwound Care Skin Prep 3. Primary Dressing Applied Iodoflex 4. Secondary Dressing Dry Gauze Roll Gauze Heel Cup 5. Secured With Recruitment consultant) Signed: 01/10/2020 5:45:30 PM By: Baruch Gouty RN, BSN Entered By: Baruch Gouty on 01/10/2020 10:45:38 -------------------------------------------------------------------------------- [place] Details Patient Name: Date of Service: Silvio Pate, Keenen L. 01/10/2020 10:00 A M Medical Record Number: [id number] Patient Account Number: [id number] Date of Birth/Sex: Treating RN: 1961/06/18 (59 y.o. Ernestene Mention Primary Care Audray Rumore: Nicholes Rough Other Clinician: Referring Selda Jalbert: Treating Teylor Wolven/Extender: Anselm Pancoast in Treatment: 12 Vital Signs Time Taken: 10:39 Temperature (F): 97.4 Height (in): 75 Pulse (bpm): 64 Source: Stated Respiratory Rate (breaths/min): 18 Weight (lbs): 240 Blood Pressure (mmHg): 124/90 Source: Stated Reference Range: 80 - 120 mg / dl Body Mass Index (BMI): 30 Electronic Signature(s) Signed: 01/10/2020 5:45:30 PM By: Baruch Gouty RN, BSN Entered By: Baruch Gouty on 01/10/2020 10:40:17
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
## 2020-01-13 ENCOUNTER — Other Ambulatory Visit: Payer: Self-pay

## 2020-01-13 ENCOUNTER — Encounter (HOSPITAL_BASED_OUTPATIENT_CLINIC_OR_DEPARTMENT_OTHER): Payer: Self-pay | Admitting: Internal Medicine

## 2020-01-13 NOTE — Progress Notes (Signed)
ARWIN, BISCEGLIA ([id number]) Visit Report for 01/13/2020 Arrival Information Details Patient Name: Date of Service: Vernon Frye, Vernon L. 01/13/2020 9:30 A M Medical Record Number: [id number] Patient Account Number: [id number] Date of Birth/Sex: Treating RN: 02/23/1961 (59 y.o. Ernestene Mention Primary Care Karthika Glasper: Nicholes Rough Other Clinician: Referring Brodee Mauritz: Treating Jissel Slavens/Extender: Anselm Pancoast in Treatment: 12 Visit Information History Since Last Visit Added or deleted any medications: No Patient Arrived: Wheel Chair Any new allergies or adverse reactions: No Arrival Time: 09:25 Had a fall or experienced change in No Accompanied By: self activities of daily living that may affect Transfer Assistance: None risk of falls: Patient Identification Verified: Yes Signs or symptoms of abuse/neglect since last visito No Secondary Verification Process Completed: Yes Hospitalized since last visit: No Patient Requires Transmission-Based Precautions: No Implantable device outside of the clinic excluding No Patient Has Alerts: No cellular tissue based products placed in the center since last visit: Has Dressing in Place as Prescribed: Yes Pain Present Now: Yes Electronic Signature(s) Signed: 01/13/2020 4:42:48 PM By: Baruch Gouty RN, BSN Entered By: Baruch Gouty on 01/13/2020 09:30:19 -------------------------------------------------------------------------------- Clinic Level of Care Assessment Details Patient Name: Date of Service: Vernon Frye, Vernon L. 01/13/2020 9:30 A M Medical Record Number: [id number] Patient Account Number: [id number] Date of Birth/Sex: Treating RN: [birth year] (59 y.o. Ernestene Mention Primary Care Kenton Fortin: Nicholes Rough Other Clinician: Referring Tyjay Galindo: Treating Kamelia Lampkins/Extender: Anselm Pancoast in Treatment: 12 Clinic Level of Care Assessment Items TOOL 4 Quantity Score X- 1 0 Use when only an  EandM is performed on FOLLOW-UP visit ASSESSMENTS - Nursing Assessment / Reassessment X- 1 10 Reassessment of Co-morbidities (includes updates in patient status) X- 1 5 Reassessment of Adherence to Treatment Plan ASSESSMENTS - Wound and Skin A ssessment / Reassessment X - Simple Wound Assessment / Reassessment - one wound 1 5 []  - 0 Complex Wound Assessment / Reassessment - multiple wounds X- 1 10 Dermatologic / Skin Assessment (not related to wound area) ASSESSMENTS - Focused Assessment []  - 0 Circumferential Edema Measurements - multi extremities X- 1 10 Nutritional Assessment / Counseling / Intervention []  - 0 Lower Extremity Assessment (monofilament, tuning fork, pulses) []  - 0 Peripheral Arterial Disease Assessment (using hand held doppler) ASSESSMENTS - Ostomy and/or Continence Assessment and Care []  - 0 Incontinence Assessment and Management []  - 0 Ostomy Care Assessment and Management (repouching, etc.) PROCESS - Coordination of Care X - Simple Patient / Family Education for ongoing care 1 15 []  - 0 Complex (extensive) Patient / Family Education for ongoing care X- 1 10 Staff obtains Programmer, systems, Records, T Results / Process Orders est []  - 0 Staff telephones HHA, Nursing Homes / Clarify orders / etc []  - 0 Routine Transfer to another Facility (non-emergent condition) []  - 0 Routine Hospital Admission (non-emergent condition) []  - 0 New Admissions / Biomedical engineer / Ordering NPWT Apligraf, etc. , []  - 0 Emergency Hospital Admission (emergent condition) X- 1 10 Simple Discharge Coordination []  - 0 Complex (extensive) Discharge Coordination PROCESS - Special Needs []  - 0 Pediatric / Minor Patient Management []  - 0 Isolation Patient Management []  - 0 Hearing / Language / Visual special needs []  - 0 Assessment of Community assistance (transportation, D/C planning, etc.) []  - 0 Additional assistance / Altered mentation []  - 0 Support Surface(s)  Assessment (bed, cushion, seat, etc.) INTERVENTIONS - Wound Cleansing / Measurement X - Simple Wound Cleansing - one wound 1 5 []  - 0 Complex  Wound Cleansing - multiple wounds []  - 0 Wound Imaging (photographs - any number of wounds) []  - 0 Wound Tracing (instead of photographs) X- 1 5 Simple Wound Measurement - one wound []  - 0 Complex Wound Measurement - multiple wounds INTERVENTIONS - Wound Dressings []  - 0 Small Wound Dressing one or multiple wounds X- 1 15 Medium Wound Dressing one or multiple wounds []  - 0 Large Wound Dressing one or multiple wounds []  - 0 Application of Medications - topical []  - 0 Application of Medications - injection INTERVENTIONS - Miscellaneous []  - 0 External ear exam []  - 0 Specimen Collection (cultures, biopsies, blood, body fluids, etc.) []  - 0 Specimen(s) / Culture(s) sent or taken to Lab for analysis []  - 0 Patient Transfer (multiple staff / Civil Service fast streamer / Similar devices) []  - 0 Simple Staple / Suture removal (25 or less) []  - 0 Complex Staple / Suture removal (26 or more) []  - 0 Hypo / Hyperglycemic Management (close monitor of Blood Glucose) []  - 0 Ankle / Brachial Index (ABI) - do not check if billed separately X- 1 5 Vital Signs Has the patient been seen at the hospital within the last three years: Yes Total Score: 105 Level Of Care: New/Established - Level 3 Electronic Signature(s) Signed: 01/13/2020 4:42:48 PM By: Baruch Gouty RN, BSN Entered By: Baruch Gouty on 01/13/2020 09:38:51 -------------------------------------------------------------------------------- Encounter Discharge Information Details Patient Name: Date of Service: Vernon Frye, Vernon L. 01/13/2020 9:30 A M Medical Record Number: [id number] Patient Account Number: [id number] Date of Birth/Sex: Treating RN: 1960/08/26 (58 y.o. Ernestene Mention Primary Care Durrel Mcnee: Nicholes Rough Other Clinician: Referring Seven Dollens: Treating Kashara Blocher/Extender: Anselm Pancoast in Treatment: 12 Encounter Discharge Information Items Discharge Condition: Stable Ambulatory Status: Wheelchair Discharge Destination: Home Transportation: Private Auto Accompanied By: self Schedule Follow-up Appointment: Yes Clinical Summary of Care: Electronic Signature(s) Signed: 01/13/2020 4:42:48 PM By: Baruch Gouty RN, BSN Entered By: Baruch Gouty on 01/13/2020 09:38:25 -------------------------------------------------------------------------------- Pain Assessment Details Patient Name: Date of Service: Vernon Frye, Vernon L. 01/13/2020 9:30 A M Medical Record Number: [id number] Patient Account Number: [id number] Date of Birth/Sex: Treating RN: 1960-12-23 (59 y.o. Ernestene Mention Primary Care Haddy Mullinax: Nicholes Rough Other Clinician: Referring Winston Misner: Treating Guerin Lashomb/Extender: Anselm Pancoast in Treatment: 12 Active Problems Location of Pain Severity and Description of Pain Patient Has Paino Yes Site Locations Pain Location: Pain Location: Generalized Pain Rate the pain. Current Pain Level: 7 Worst Pain Level: 10 Least Pain Level: 0 Tolerable Pain Level: 8 Pain Management and Medication Current Pain Management: Medication: No Cold Application: No Rest: No Massage: No Activity: No T.E.N.S.: No Heat Application: No Leg drop or elevation: No Is the Current Pain Management Adequate: Adequate How does your wound impact your activities of daily livingo Sleep: No Bathing: No Appetite: No Relationship With Others: No Bladder Continence: No Emotions: No Bowel Continence: No Work: No Toileting: No Drive: No Dressing: No Hobbies: No Electronic Signature(s) Signed: 01/13/2020 4:42:48 PM By: Baruch Gouty RN, BSN Entered By: Baruch Gouty on 01/13/2020 09:37:26 -------------------------------------------------------------------------------- Patient/Caregiver Education Details Patient Name: Date of  Service: Vernon Frye, Vernon L. 6/18/2021andnbsp9:30 A M Medical Record Number: [id number] Patient Account Number: [id number] Date of Birth/Gender: Treating RN: 10/17/1960 (59 y.o. Ernestene Mention Primary Care Physician: Nicholes Rough Other Clinician: Referring Physician: Treating Physician/Extender: Anselm Pancoast in Treatment: 12 Education Assessment Education Provided To: Patient Education Topics Provided Wound/Skin Impairment: Handouts: Skin Care Do's and Dont's Methods: Explain/Verbal  Responses: Reinforcements needed Electronic Signature(s) Signed: 01/13/2020 4:42:48 PM By: Baruch Gouty RN, BSN Entered By: Baruch Gouty on 01/13/2020 09:38:00 -------------------------------------------------------------------------------- Wound Assessment Details Patient Name: Date of Service: Vernon Frye, Vernon L. 01/13/2020 9:30 A M Medical Record Number: [id number] Patient Account Number: [id number] Date of Birth/Sex: Treating RN: 02/07/1961 (59 y.o. Ernestene Mention Primary Care Pearson Picou: Nicholes Rough Other Clinician: Referring Travelle Mcclimans: Treating Shavana Calder/Extender: Anselm Pancoast in Treatment: 12 Wound Status Wound Number: 1 Primary Etiology: Pressure Ulcer Wound Location: Left Calcaneus Wound Status: Open Wounding Event: Pressure Injury Comorbid History: Sleep Apnea, Arrhythmia, Hypertension Date Acquired: 07/17/2019 Weeks Of Treatment: 12 Clustered Wound: No Wound Measurements Length: (cm) 4.4 Width: (cm) 3.8 Depth: (cm) 0.9 Area: (cm) 13.132 Volume: (cm) 11.819 % Reduction in Area: 13.6% % Reduction in Volume: -677.6% Epithelialization: Small (1-33%) Tunneling: No Undermining: No Wound Description Classification: Category/Stage III Wound Margin: Well defined, not attached Exudate Amount: Medium Exudate Type: Serosanguineous Exudate Color: red, brown Foul Odor After Cleansing: No Slough/Fibrino Yes Wound  Bed Granulation Amount: Medium (34-66%) Exposed Structure Granulation Quality: Pink Fascia Exposed: No Necrotic Amount: Medium (34-66%) Fat Layer (Subcutaneous Tissue) Exposed: Yes Necrotic Quality: Adherent Slough Tendon Exposed: No Muscle Exposed: No Joint Exposed: No Bone Exposed: No Treatment Notes Wound #1 (Left Calcaneus) 1. Cleanse With Wound Cleanser 3. Primary Dressing Applied Iodoflex 4. Secondary Dressing Dry Gauze Roll Gauze Heel Cup 5. Secured With [facility]) Signed: 01/13/2020 4:42:48 PM By: Baruch Gouty RN, BSN Entered By: Baruch Gouty on 01/13/2020 09:36:53 -------------------------------------------------------------------------------- [place] Details Patient Name: Date of Service: Vernon Frye, Vernon L. 01/13/2020 9:30 A M Medical Record Number: [id number] Patient Account Number: [id number] Date of Birth/Sex: Treating RN: 1960/12/17 (59 y.o. Ernestene Mention Primary Care Sheila Ocasio: Nicholes Rough Other Clinician: Referring Niki Payment: Treating Rosalia Mcavoy/Extender: Anselm Pancoast in Treatment: 12 Vital Signs Time Taken: 09:30 Temperature (F): 98.1 Height (in): 75 Pulse (bpm): 73 Source: Stated Respiratory Rate (breaths/min): 18 Weight (lbs): 240 Blood Pressure (mmHg): 151/83 Source: Stated Reference Range: 80 - 120 mg / dl Body Mass Index (BMI): 30 Electronic Signature(s) Signed: 01/13/2020 4:42:48 PM By: Baruch Gouty RN, BSN Entered By: Baruch Gouty on 01/13/2020 09:30:45

## 2020-01-13 NOTE — Progress Notes (Signed)
FORTUNE, BRANNIGAN ([id number]) Visit Report for 01/13/2020 SuperBill Details Patient Name: Date of Service: Vernon Frye, Vernon L. 01/13/2020 Medical Record Number: [id number] Patient Account Number: [id number] Date of Birth/Sex: Treating RN: 15-Mar-1961 (59 y.o. 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

## 2020-01-16 ENCOUNTER — Encounter (HOSPITAL_BASED_OUTPATIENT_CLINIC_OR_DEPARTMENT_OTHER): Payer: Self-pay | Admitting: Internal Medicine

## 2020-01-17 ENCOUNTER — Encounter (HOSPITAL_BASED_OUTPATIENT_CLINIC_OR_DEPARTMENT_OTHER): Payer: Self-pay | Admitting: Internal Medicine
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
## 2020-01-19 ENCOUNTER — Other Ambulatory Visit: Payer: Self-pay

## 2020-01-19 ENCOUNTER — Encounter (HOSPITAL_BASED_OUTPATIENT_CLINIC_OR_DEPARTMENT_OTHER): Payer: Self-pay | Admitting: Internal Medicine

## 2020-01-19 NOTE — Progress Notes (Signed)
COLBI, STAUBS ([id number]) Visit Report for 01/16/2020 Arrival Information Details Patient Name: Date of Service: Vernon Frye, Vernon L. 01/16/2020 3:00 PM Medical Record Number: [id number] Patient Account Number: [id number] Date of Birth/Sex: Treating RN: 12-10-1960 (59 y.o. Marvis Repress Primary Care Izaah Westman: Nicholes Rough Other Clinician: Referring Sidda Humm: Treating Key Cen/Extender: Anselm Pancoast in Treatment: 12 Visit Information History Since Last Visit Added or deleted any medications: No Patient Arrived: Wheel Chair Any new allergies or adverse reactions: No Arrival Time: 16:17 Had a fall or experienced change in No Accompanied By: self activities of daily living that may affect Transfer Assistance: None risk of falls: Patient Identification Verified: Yes Signs or symptoms of abuse/neglect since last visito No Secondary Verification Process Completed: Yes Hospitalized since last visit: No Patient Requires Transmission-Based Precautions: No Implantable device outside of the clinic excluding No Patient Has Alerts: No cellular tissue based products placed in the center since last visit: Has Dressing in Place as Prescribed: Yes Pain Present Now: No Electronic Signature(s) Signed: 01/16/2020 6:16:49 PM By: Kela Millin Entered By: Kela Millin on 01/16/2020 16:17:16 -------------------------------------------------------------------------------- Encounter Discharge Information Details Patient Name: Date of Service: Vernon Frye, Vernon L. 01/16/2020 3:00 PM Medical Record Number: [id number] Patient Account Number: [id number] Date of Birth/Sex: Treating RN: [birth year] (59 y.o. Marvis Repress Primary Care Fadumo Heng: Nicholes Rough Other Clinician: Referring Osamah Schmader: Treating Bryahna Lesko/Extender: Anselm Pancoast in Treatment: 12 Encounter Discharge Information Items Post Procedure Vitals Discharge Condition:  Stable Temperature (F): 97.7 Ambulatory Status: Wheelchair Pulse (bpm): 55 Discharge Destination: Home Respiratory Rate (breaths/min): 18 Transportation: Other Blood Pressure (mmHg): 169/96 Accompanied By: self Schedule Follow-up Appointment: Yes Clinical Summary of Care: Patient Declined Electronic Signature(s) Signed: 01/16/2020 6:16:49 PM By: Kela Millin Entered By: Kela Millin on 01/16/2020 17:32:54 -------------------------------------------------------------------------------- Lower Extremity Assessment Details Patient Name: Date of Service: Vernon Frye, [name] 01/16/2020 3:00 PM Medical Record Number: [id number] Patient Account Number: [id number] Date of Birth/Sex: Treating RN: [birth year] (59 y.o. Marvis Repress Primary Care Letesha Klecker: Nicholes Rough Other Clinician: Referring Srinivas Lippman: Treating Ennio Houp/Extender: Anselm Pancoast in Treatment: 12 Edema Assessment Assessed: Shirlyn Goltz: No] Patrice Paradise: No] Edema: [Left: Ye] [Right: s] Calf Left: Right: Point of Measurement: cm From Medial Instep 40.5 cm cm Ankle Left: Right: Point of Measurement: cm From Medial Instep 32 cm cm Vascular Assessment Pulses: Dorsalis Pedis Palpable: [Left:Yes] Electronic Signature(s) Signed: 01/16/2020 6:16:49 PM By: Kela Millin Entered By: Kela Millin on 01/16/2020 16:17:54 -------------------------------------------------------------------------------- Multi Wound Chart Details Patient Name: Date of Service: Vernon Frye, Vernon L. 01/16/2020 3:00 PM Medical Record Number: [id number] Patient Account Number: [id number] Date of Birth/Sex: Treating RN: 12-04-1960 (58 y.o. [name] Primary Care Rainbow Salman: Nicholes Rough Other Clinician: Referring Deacon Gadbois: Treating Nikolai Wilczak/Extender: Anselm Pancoast in Treatment: 12 Vital Signs Height(in): 75 Pulse(bpm): 55 Weight(lbs): 240 Blood Pressure(mmHg): 169/96 Body Mass Index(BMI):  30 Temperature(F): 97.7 Respiratory Rate(breaths/min): 18 Photos: [1:No Photos Left Calcaneus] [N/A:N/A N/A] Wound Location: [1:Pressure Injury] [N/A:N/A] Wounding Event: [1:Pressure Ulcer] [N/A:N/A] Primary Etiology: [1:07/17/2019] [N/A:N/A] Date Acquired: [1:12] [N/A:N/A] Weeks of Treatment: [1:Open] [N/A:N/A] Wound Status: [1:4.3x4.5x0.9] [N/A:N/A] Measurements L x W x D (cm) [1:15.197] [N/A:N/A] A (cm) : rea [1:13.678] [N/A:N/A] Volume (cm) : [1:0.00%] [N/A:N/A] % Reduction in A [1:rea: -799.90%] [N/A:N/A] % Reduction in Volume: [1:Category/Stage III] [N/A:N/A] Classification: [1:Debridement - Excisional] [N/A:N/A] Debridement: [1:17:08] [N/A:N/A] Pre-procedure Verification/Time Out Taken: [1:Subcutaneous, Slough] [N/A:N/A] Tissue Debrided: [1:Skin/Subcutaneous Tissue] [N/A:N/A] Level: [1:19.35] [N/A:N/A] Debridement A (sq cm): [1:rea Curette] [N/A:N/A]  Instrument: [1:Minimum] [N/A:N/A] Bleeding: [1:Pressure] [N/A:N/A] Hemostasis A chieved: [1:0] [N/A:N/A] Procedural Pain: [1:0] [N/A:N/A] Post Procedural Pain: [1:Procedure was tolerated well] [N/A:N/A] Debridement Treatment Response: [1:4.3x4.5x0.9] [N/A:N/A] Post Debridement Measurements L x W x D (cm) [1:13.678] [N/A:N/A] Post Debridement Volume: (cm) [1:Category/Stage III] [N/A:N/A] Post Debridement Stage: [1:Debridement] [N/A:N/A] Treatment Notes Wound #1 (Left Calcaneus) 1. Cleanse With Wound Cleanser 3. Primary Dressing Applied Iodoflex 4. Secondary Dressing Dry Gauze Roll Gauze Heel Cup 5. Secured With Medipore tape 6. Support Layer Holiday representative) Signed: 01/16/2020 6:03:41 PM By: [name] MD Signed: 01/18/2020 4:51:47 PM By: [name] RN Entered By: [name] on 01/16/2020 17:44:33 -------------------------------------------------------------------------------- Multi-Disciplinary Care Plan Details Patient Name: Date of Service: Vernon Frye, Vernon L.  01/16/2020 3:00 PM Medical Record Number: [id number] Patient Account Number: [id number] Date of Birth/Sex: Treating RN: 13-Dec-1960 (59 y.o. [name] Primary Care Teriah Muela: Nicholes Rough Other Clinician: Referring Mikhayla Phillis: Treating Benen Weida/Extender: Anselm Pancoast in Treatment: 12 Active Inactive Wound/Skin Impairment Nursing Diagnoses: Knowledge deficit related to ulceration/compromised skin integrity Goals: Patient/caregiver will verbalize understanding of skin care regimen Date Initiated: 10/18/2019 Target Resolution Date: 01/27/2020 Goal Status: Active Ulcer/skin breakdown will have a volume reduction of 30% by week 4 Date Initiated: 10/18/2019 Date Inactivated: 11/22/2019 Target Resolution Date: 11/18/2019 Goal Status: Unmet Unmet Reason: comorbities Ulcer/skin breakdown will have a volume reduction of 50% by week 8 Date Initiated: 11/22/2019 Date Inactivated: 12/22/2019 Target Resolution Date: 12/16/2019 Unmet Reason: volume has not Goal Status: Unmet reduced see wound measurements from week 8. Interventions: Assess patient/caregiver ability to obtain necessary supplies Assess patient/caregiver ability to perform ulcer/skin care regimen upon admission and as needed Assess ulceration(s) every visit Notes: Electronic Signature(s) Signed: 01/17/2020 5:24:50 PM By: Levan Hurst RN, BSN Entered By: Levan Hurst on 01/16/2020 19:11:00 -------------------------------------------------------------------------------- Pain Assessment Details Patient Name: Date of Service: Vernon Frye, Maurisio L. 01/16/2020 3:00 PM Medical Record Number: [id number] Patient Account Number: [id number] Date of Birth/Sex: Treating RN: 06-07-1961 (58 y.o. Marvis Repress Primary Care Derinda Bartus: Nicholes Rough Other Clinician: Referring Emmani Lesueur: Treating Delorse Shane/Extender: Anselm Pancoast in Treatment: 12 Active Problems Location of Pain Severity and  Description of Pain Patient Has Paino No Site Locations Pain Management and Medication Current Pain Management: Electronic Signature(s) Signed: 01/16/2020 6:16:49 PM By: Kela Millin Entered By: Kela Millin on 01/16/2020 16:17:40 -------------------------------------------------------------------------------- Patient/Caregiver Education Details Patient Name: Date of Service: Vernon Frye, Jarrell L. 6/21/2021andnbsp3:00 PM Medical Record Number: [id number] Patient Account Number: [id number] Date of Birth/Gender: Treating RN: 25-Sep-1960 (59 y.o. [name] Primary Care Physician: Nicholes Rough Other Clinician: Referring Physician: Treating Physician/Extender: Anselm Pancoast in Treatment: 12 Education Assessment Education Provided To: Patient Education Topics Provided Wound/Skin Impairment: Methods: Explain/Verbal Responses: State content correctly [employer]) Signed: 01/17/2020 5:24:50 PM By: Levan Hurst RN, BSN Entered By: Levan Hurst on 01/16/2020 19:11:10 -------------------------------------------------------------------------------- Wound Assessment Details Patient Name: Date of Service: Vernon Frye, Thurman L. 01/16/2020 3:00 PM Medical Record Number: [id number] Patient Account Number: [id number] Date of Birth/Sex: Treating RN: [birth year] (59 y.o. Marvis Repress Primary Care Nidia Grogan: Nicholes Rough Other Clinician: Referring Chaundra Abreu: Treating Bern Fare/Extender: Anselm Pancoast in Treatment: 12 Wound Status Wound Number: 1 Primary Etiology: Pressure Ulcer Wound Location: Left Calcaneus Wound Status: Open Wounding Event: Pressure Injury Comorbid History: Sleep Apnea, Arrhythmia, Hypertension Date Acquired: 07/17/2019 Weeks Of Treatment: 12 Clustered Wound: No Photos Wound Measurements Length: (cm) 4.3 Width: (cm) 4.5 Depth: (cm) 0.9 Area: (cm) 15.197 Volume: (  cm) 13.678 % Reduction in  Area: 0% % Reduction in Volume: -799.9% Epithelialization: Small (1-33%) Wound Description Classification: Category/Stage III Wound Margin: Well defined, not attached Exudate Amount: Medium Exudate Type: Serosanguineous Exudate Color: red, brown Foul Odor After Cleansing: No Slough/Fibrino Yes Wound Bed Granulation Amount: Medium (34-66%) Exposed Structure Granulation Quality: Pink Fascia Exposed: No Necrotic Amount: Medium (34-66%) Fat Layer (Subcutaneous Tissue) Exposed: Yes Necrotic Quality: Adherent Slough Tendon Exposed: No Muscle Exposed: No Joint Exposed: No Bone Exposed: No Electronic Signature(s) Signed: 01/18/2020 5:20:20 PM By: Minerva Fester Signed: 01/19/2020 5:29:15 PM By: Kela Millin Previous Signature: 01/16/2020 6:16:49 PM Version By: Kela Millin Entered By: Minerva Fester on 01/18/2020 09:25:35 -------------------------------------------------------------------------------- Vitals Details Patient Name: Date of Service: Vernon Frye, Zephaniah L. 01/16/2020 3:00 PM Medical Record Number: [id number] Patient Account Number: [id number] Date of Birth/Sex: Treating RN: 03/19/1961 (59 y.o. Marvis Repress Primary Care Arvis Zwahlen: Nicholes Rough Other Clinician: Referring Jamarrion Budai: Treating Jobany Montellano/Extender: Anselm Pancoast in Treatment: 12 Vital Signs Time Taken: 16:05 Temperature (F): 97.7 Height (in): 75 Pulse (bpm): 55 Weight (lbs): 240 Respiratory Rate (breaths/min): 18 Body Mass Index (BMI): 30 Blood Pressure (mmHg): 169/96 Reference Range: 80 - 120 mg / dl Electronic Signature(s) Signed: 01/16/2020 6:16:49 PM By: Kela Millin Entered By: Kela Millin on 01/16/2020 16:17:35

## 2020-01-19 NOTE — Progress Notes (Signed)
UZZIEL, RUSSEY ([id number]) Visit Report for 01/19/2020 Arrival Information Details Patient Name: Date of Service: Vernon Frye, Vernon L. 01/19/2020 10:15 A M Medical Record Number: [id number] Patient Account Number: [id number] Date of Birth/Sex: Treating RN: 03-28-1961 (59 y.o. Marvis Repress Primary Care Ozro Russett: Nicholes Rough Other Clinician: Referring Fronia Depass: Treating Macala Baldonado/Extender: Anselm Pancoast in Treatment: 98 Visit Information History Since Last Visit Added or deleted any medications: No Patient Arrived: Wheel Chair Any new allergies or adverse reactions: No Arrival Time: 10:35 Had a fall or experienced change in No Accompanied By: friend activities of daily living that may affect Transfer Assistance: None risk of falls: Patient Identification Verified: Yes Signs or symptoms of abuse/neglect since last visito No Secondary Verification Process Completed: Yes Hospitalized since last visit: No Patient Requires Transmission-Based Precautions: No Implantable device outside of the clinic excluding No Patient Has Alerts: No cellular tissue based products placed in the center since last visit: Has Dressing in Place as Prescribed: No Has Compression in Place as Prescribed: No Pain Present Now: No Notes took dressing off before he came Electronic Signature(s) Signed: 01/19/2020 5:29:15 PM By: Kela Millin Entered By: Kela Millin on 01/19/2020 10:49:31 -------------------------------------------------------------------------------- Clinic Level of Care Assessment Details Patient Name: Date of Service: Vernon Frye, Vernon L. 01/19/2020 10:15 A M Medical Record Number: [id number] Patient Account Number: [id number] Date of Birth/Sex: Treating RN: [birth year] (59 y.o. Marvis Repress Primary Care Callia Swim: Nicholes Rough Other Clinician: Referring Cesar Alf: Treating Dequavius Kuhner/Extender: Anselm Pancoast in Treatment:  13 Clinic Level of Care Assessment Items TOOL 4 Quantity Score X- 1 0 Use when only an EandM is performed on FOLLOW-UP visit ASSESSMENTS - Nursing Assessment / Reassessment X- 1 10 Reassessment of Co-morbidities (includes updates in patient status) X- 1 5 Reassessment of Adherence to Treatment Plan ASSESSMENTS - Wound and Skin A ssessment / Reassessment X - Simple Wound Assessment / Reassessment - one wound 1 5 []  - 0 Complex Wound Assessment / Reassessment - multiple wounds []  - 0 Dermatologic / Skin Assessment (not related to wound area) ASSESSMENTS - Focused Assessment []  - 0 Circumferential Edema Measurements - multi extremities []  - 0 Nutritional Assessment / Counseling / Intervention []  - 0 Lower Extremity Assessment (monofilament, tuning fork, pulses) []  - 0 Peripheral Arterial Disease Assessment (using hand held doppler) ASSESSMENTS - Ostomy and/or Continence Assessment and Care []  - 0 Incontinence Assessment and Management []  - 0 Ostomy Care Assessment and Management (repouching, etc.) PROCESS - Coordination of Care X - Simple Patient / Family Education for ongoing care 1 15 []  - 0 Complex (extensive) Patient / Family Education for ongoing care X- 1 10 Staff obtains Programmer, systems, Records, T Results / Process Orders est []  - 0 Staff telephones HHA, Nursing Homes / Clarify orders / etc []  - 0 Routine Transfer to another Facility (non-emergent condition) []  - 0 Routine Hospital Admission (non-emergent condition) []  - 0 New Admissions / Biomedical engineer / Ordering NPWT Apligraf, etc. , []  - 0 Emergency Hospital Admission (emergent condition) X- 1 10 Simple Discharge Coordination []  - 0 Complex (extensive) Discharge Coordination PROCESS - Special Needs []  - 0 Pediatric / Minor Patient Management []  - 0 Isolation Patient Management []  - 0 Hearing / Language / Visual special needs []  - 0 Assessment of Community assistance (transportation, D/C  planning, etc.) []  - 0 Additional assistance / Altered mentation []  - 0 Support Surface(s) Assessment (bed, cushion, seat, etc.) INTERVENTIONS - Wound Cleansing / Measurement X -  Simple Wound Cleansing - one wound 1 5 []  - 0 Complex Wound Cleansing - multiple wounds []  - 0 Wound Imaging (photographs - any number of wounds) []  - 0 Wound Tracing (instead of photographs) X- 1 5 Simple Wound Measurement - one wound []  - 0 Complex Wound Measurement - multiple wounds INTERVENTIONS - Wound Dressings X - Small Wound Dressing one or multiple wounds 1 10 []  - 0 Medium Wound Dressing one or multiple wounds []  - 0 Large Wound Dressing one or multiple wounds X- 1 5 Application of Medications - topical []  - 0 Application of Medications - injection INTERVENTIONS - Miscellaneous []  - 0 External ear exam []  - 0 Specimen Collection (cultures, biopsies, blood, body fluids, etc.) []  - 0 Specimen(s) / Culture(s) sent or taken to Lab for analysis []  - 0 Patient Transfer (multiple staff / Civil Service fast streamer / Similar devices) []  - 0 Simple Staple / Suture removal (25 or less) []  - 0 Complex Staple / Suture removal (26 or more) []  - 0 Hypo / Hyperglycemic Management (close monitor of Blood Glucose) []  - 0 Ankle / Brachial Index (ABI) - do not check if billed separately X- 1 5 Vital Signs Has the patient been seen at the hospital within the last three years: Yes Total Score: 85 Level Of Care: New/Established - Level 3 Electronic Signature(s) Signed: 01/19/2020 5:29:15 PM By: Kela Millin Entered By: Kela Millin on 01/19/2020 10:54:37 -------------------------------------------------------------------------------- Encounter Discharge Information Details Patient Name: Date of Service: Vernon Frye, Vernon L. 01/19/2020 10:15 A M Medical Record Number: [id number] Patient Account Number: [id number] Date of Birth/Sex: Treating RN: 11-04-1960 (59 y.o. Marvis Repress Primary Care  Keidan Aumiller: Nicholes Rough Other Clinician: Referring Rosabell Geyer: Treating Lesieli Bresee/Extender: Anselm Pancoast in Treatment: 13 Encounter Discharge Information Items Discharge Condition: Stable Ambulatory Status: Wheelchair Discharge Destination: Home Transportation: Private Auto Accompanied By: friend Schedule Follow-up Appointment: Yes Clinical Summary of Care: Patient Declined Electronic Signature(s) Signed: 01/19/2020 5:29:15 PM By: Kela Millin Entered By: Kela Millin on 01/19/2020 10:55:36 -------------------------------------------------------------------------------- Patient/Caregiver Education Details Patient Name: Date of Service: Vernon Frye, Vernon Frye 6/24/2021andnbsp10:15 A M Medical Record Number: [id number] Patient Account Number: [id number] Date of Birth/Gender: Treating RN: 1961/01/07 (59 y.o. Marvis Repress Primary Care Physician: Nicholes Rough Other Clinician: Referring Physician: Treating Physician/Extender: Anselm Pancoast in Treatment: 13 Education Assessment Education Provided To: Patient Education Topics Provided Wound/Skin Impairment: Handouts: Caring for Your Ulcer Methods: Explain/Verbal Responses: State content correctly Electronic Signature(s) Signed: 01/19/2020 5:29:15 PM By: Kela Millin Entered By: Kela Millin on 01/19/2020 10:54:56 -------------------------------------------------------------------------------- Wound Assessment Details Patient Name: Date of Service: Vernon Frye, Vernon L. 01/19/2020 10:15 A M Medical Record Number: [id number] Patient Account Number: [id number] Date of Birth/Sex: Treating RN: Nov 11, 1960 (59 y.o. Marvis Repress Primary Care Denessa Cavan: Nicholes Rough Other Clinician: Referring Haily Caley: Treating Latiqua Daloia/Extender: Anselm Pancoast in Treatment: 13 Wound Status Wound Number: 1 Primary Etiology: Pressure Ulcer Wound Location: Left  Calcaneus Wound Status: Open Wounding Event: Pressure Injury Comorbid History: Sleep Apnea, Arrhythmia, Hypertension Date Acquired: 07/17/2019 Weeks Of Treatment: 13 Clustered Wound: No Wound Measurements Length: (cm) 4.3 Width: (cm) 4.5 Depth: (cm) 0.9 Area: (cm) 15.197 Volume: (cm) 13.678 % Reduction in Area: 0% % Reduction in Volume: -799.9% Epithelialization: Small (1-33%) Tunneling: No Undermining: No Wound Description Classification: Category/Stage III Wound Margin: Well defined, not attached Exudate Amount: Medium Exudate Type: Serosanguineous Exudate Color: red, brown Foul Odor After Cleansing: No Slough/Fibrino Yes Wound Bed Granulation Amount:  Medium (34-66%) Exposed Structure Granulation Quality: Pink Fascia Exposed: No Necrotic Amount: Medium (34-66%) Fat Layer (Subcutaneous Tissue) Exposed: Yes Necrotic Quality: Adherent Slough Tendon Exposed: No Muscle Exposed: No Joint Exposed: No Bone Exposed: No Treatment Notes Wound #1 (Left Calcaneus) 1. Cleanse With Wound Cleanser Soap and water 2. Periwound Care Moisturizing lotion 3. Primary Dressing Applied Iodoflex 4. Secondary Dressing Dry Gauze Heel Cup Drawtex 6. Support Layer Applied Kerlix/Coban Notes Horticulturist, commercial) Signed: 01/19/2020 5:29:15 PM By: Kela Millin Entered By: Kela Millin on 01/19/2020 10:52:48 -------------------------------------------------------------------------------- Vitals Details Patient Name: Date of Service: Vernon Frye, Vernon L. 01/19/2020 10:15 A M Medical Record Number: [id number] Patient Account Number: [id number] Date of Birth/Sex: Treating RN: Mar 06, 1961 (59 y.o. Marvis Repress Primary Care Keyler Hoge: Nicholes Rough Other Clinician: Referring Roma Bondar: Treating Gorge Almanza/Extender: Anselm Pancoast in Treatment: 13 Vital Signs Time Taken: 10:45 Temperature (F): 98.1 Height (in): 75 Pulse (bpm): 88 Weight  (lbs): 240 Respiratory Rate (breaths/min): 18 Body Mass Index (BMI): 30 Blood Pressure (mmHg): 151/88 Reference Range: 80 - 120 mg / dl Electronic Signature(s) Signed: 01/19/2020 5:29:15 PM By: Kela Millin Entered By: Kela Millin on 01/19/2020 10:49:58

## 2020-01-20 NOTE — Progress Notes (Signed)
Vernon Frye, Vernon Frye ([id number]) Visit Report for 01/19/2020 SuperBill Details Patient Name: Date of Service: Vernon Frye, Vernon L. 01/19/2020 Medical Record Number: [id number] Patient Account Number: [id number] Date of Birth/Sex: Treating RN: [birth year] (59 y.o. 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

## 2020-01-24 ENCOUNTER — Encounter (HOSPITAL_BASED_OUTPATIENT_CLINIC_OR_DEPARTMENT_OTHER): Payer: Self-pay | Admitting: Internal Medicine

## 2020-01-26 DEATH — deceased

## 2021-05-01 IMAGING — DX DG KNEE COMPLETE 4+V*L*
4 series · 4 of 4 positions shown · non-contrast
Comparison: 06/29/2015

CLINICAL DATA: Knee swelling and redness

EXAM:
LEFT KNEE - COMPLETE 4+ VIEW

[knee ap]
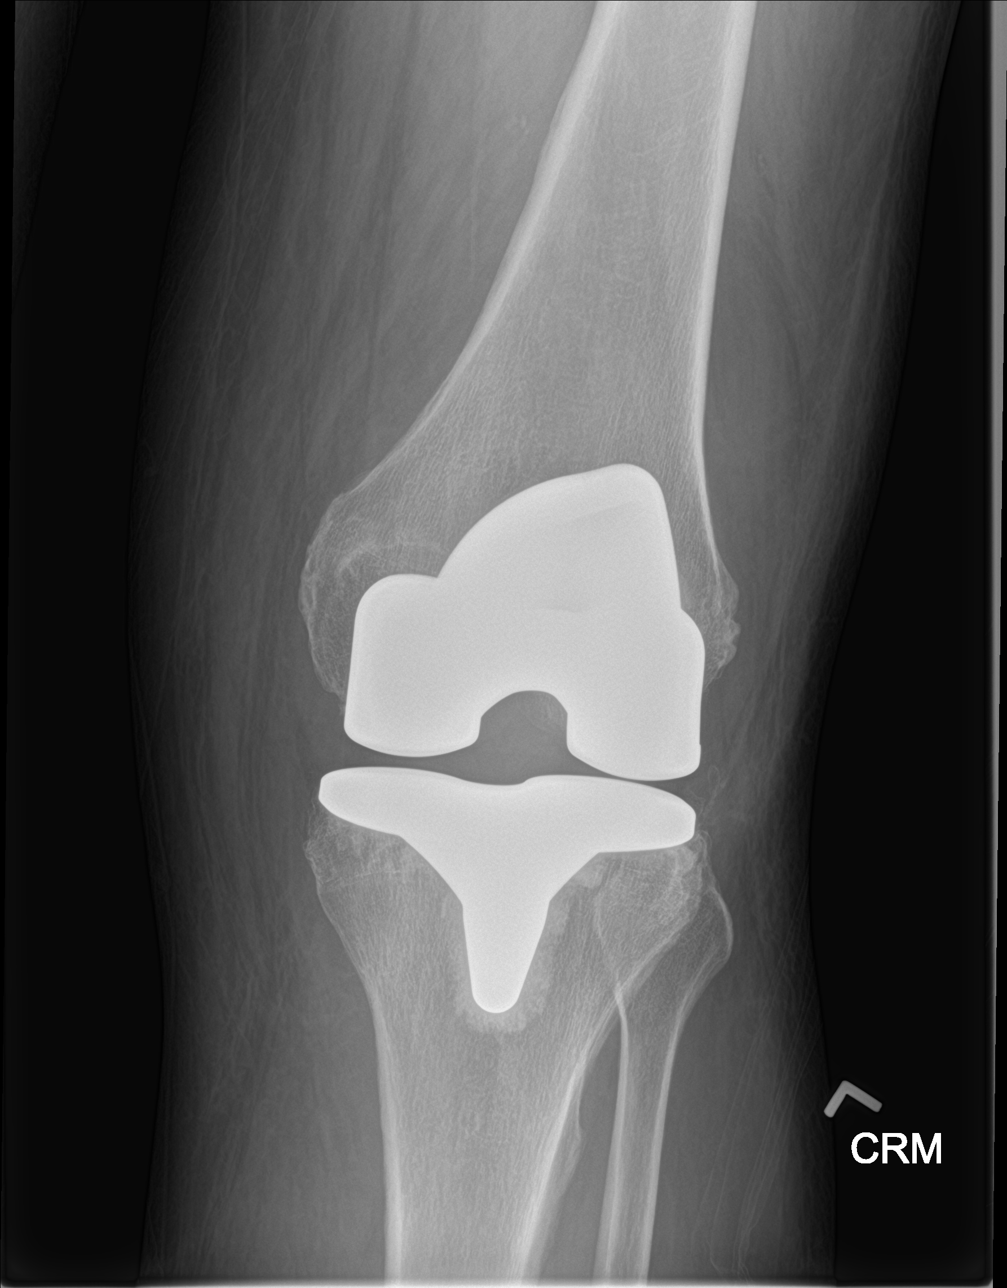

[knee lat]
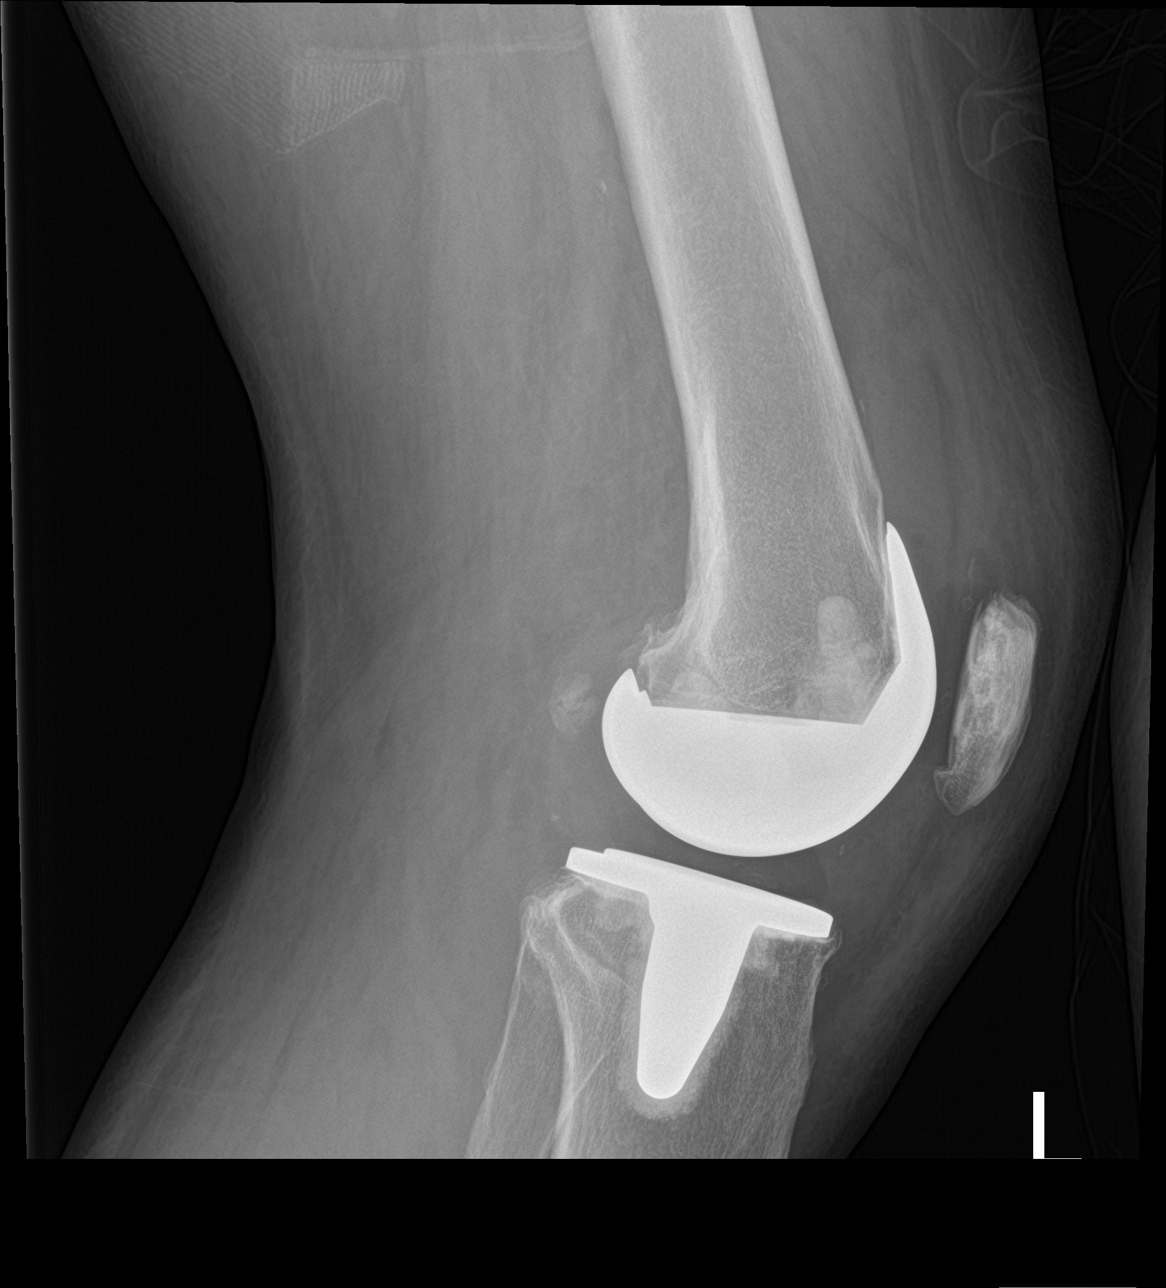

[knee obl (1 of 2)]
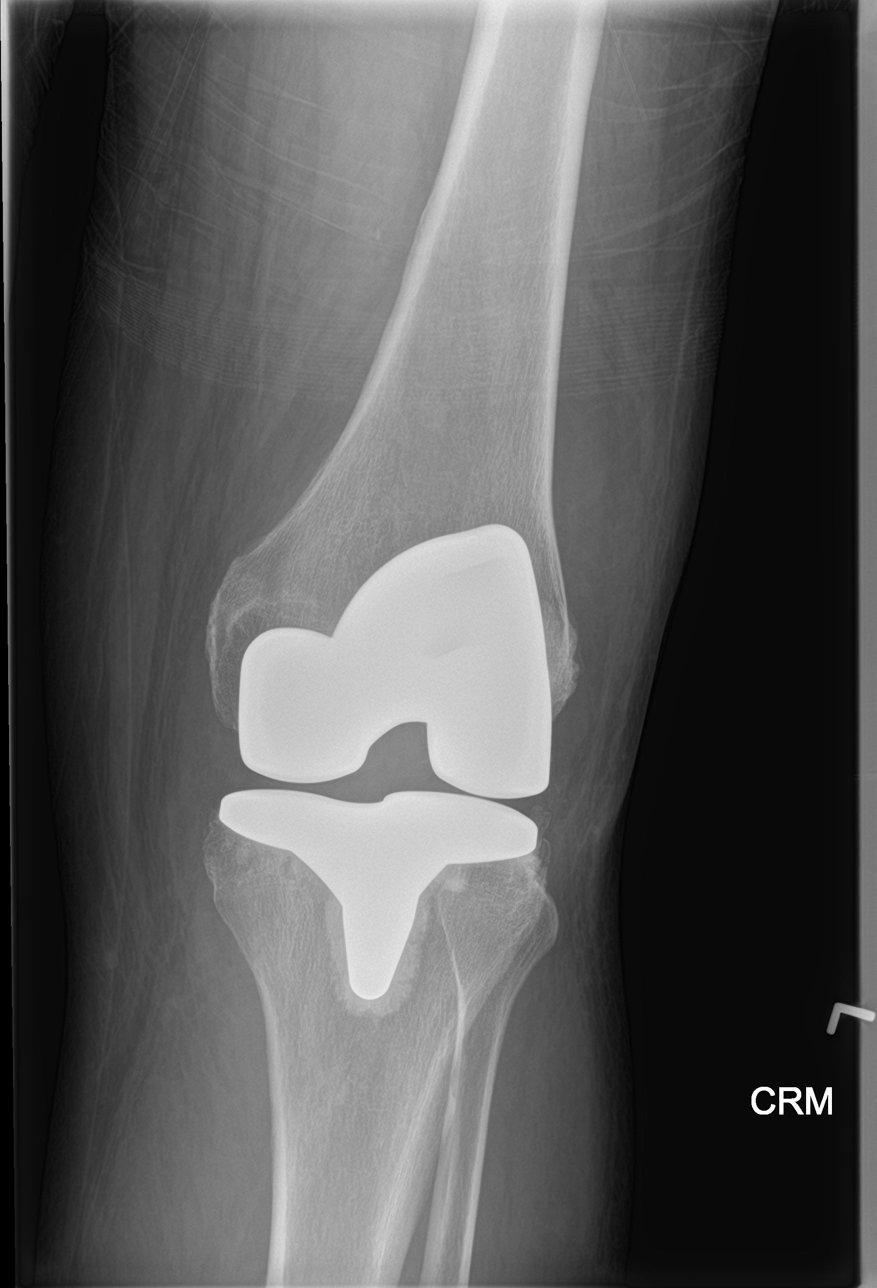

[knee obl (2 of 2)]
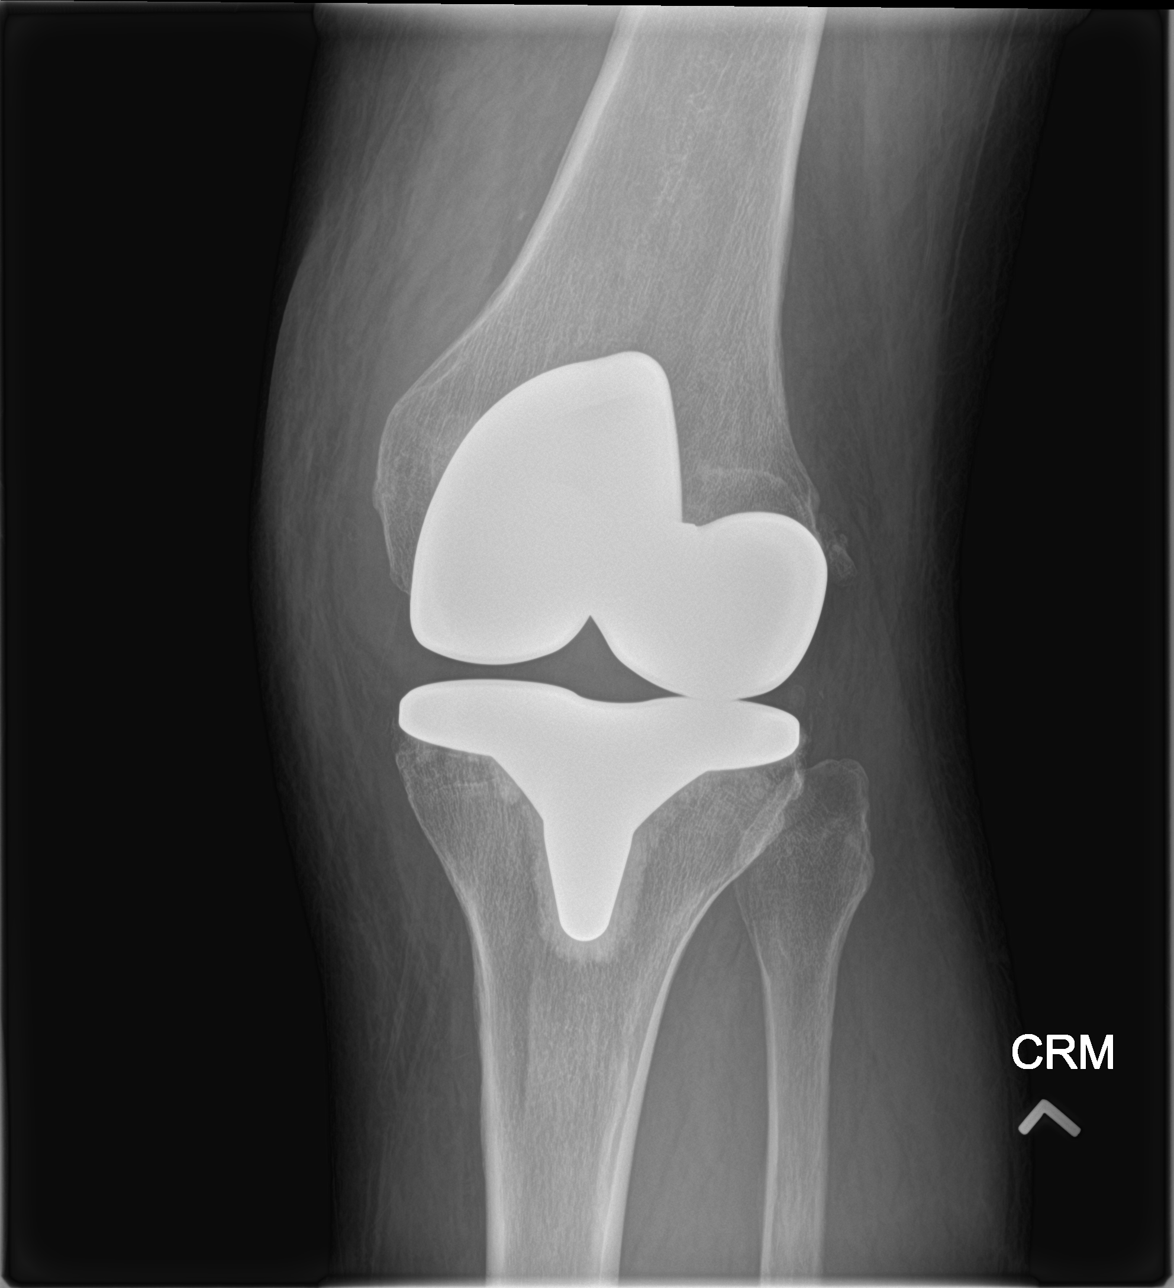

[4 of 4 positions shown; findings below may reference images not displayed]

FINDINGS: Prior left knee replacement, stable. Moderate joint effusion. No
hardware complicating feature. No fracture, subluxation or
dislocation.
IMPRESSION: Prior left knee replacement. Moderate joint effusion. No acute bony
abnormality.

## 2021-05-02 IMAGING — US US RENAL
1 series · 14 of 25 positions shown · non-contrast
Comparison: None.

CLINICAL DATA: Acute renal injury

EXAM:
RENAL / URINARY TRACT ULTRASOUND COMPLETE

[Series 1: us renal · 14 of 31 slices shown]
[im 1/31]
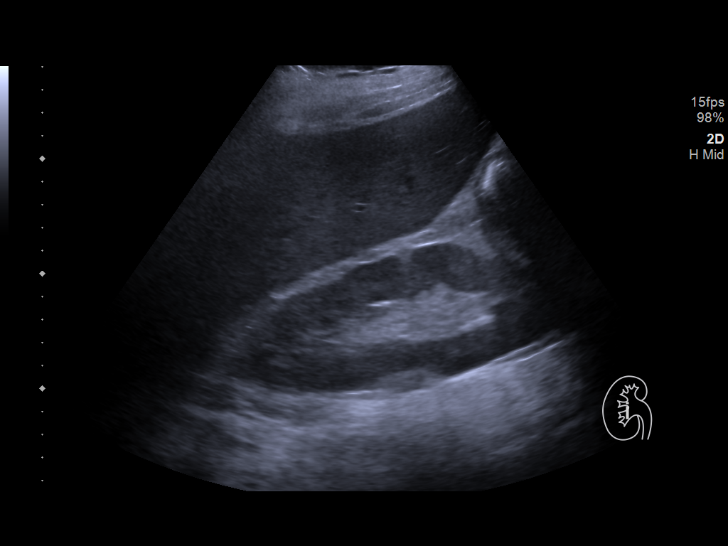
[im 3/31]
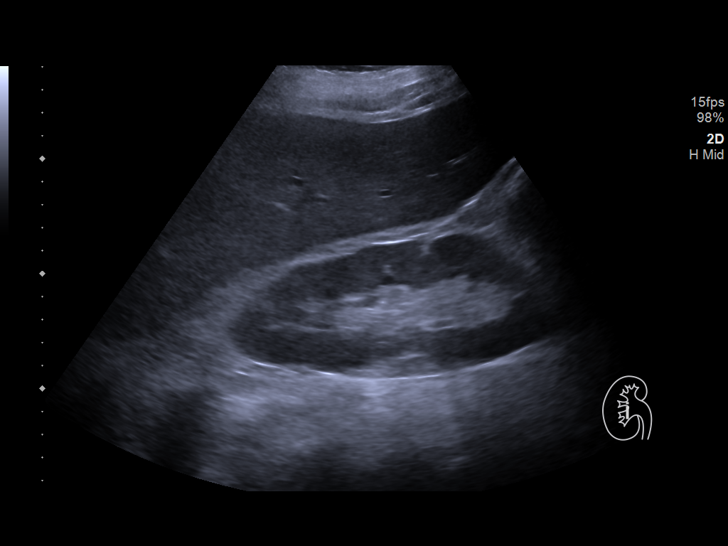
[im 6/31]
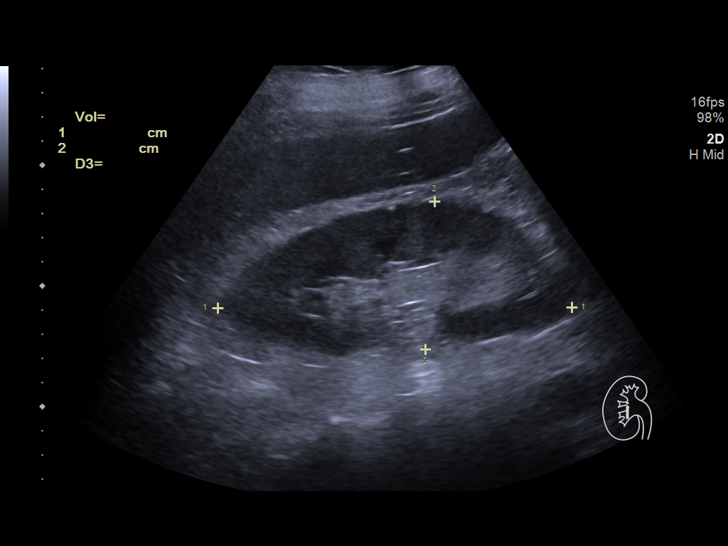
[im 8/31]
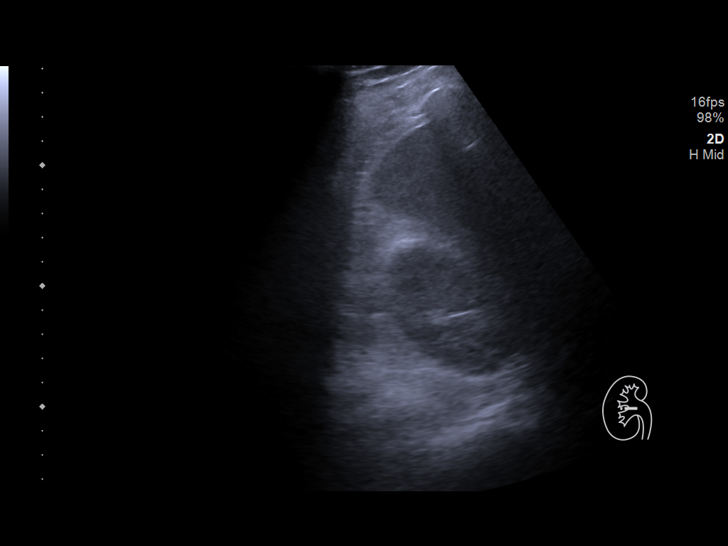
[im 11/31]
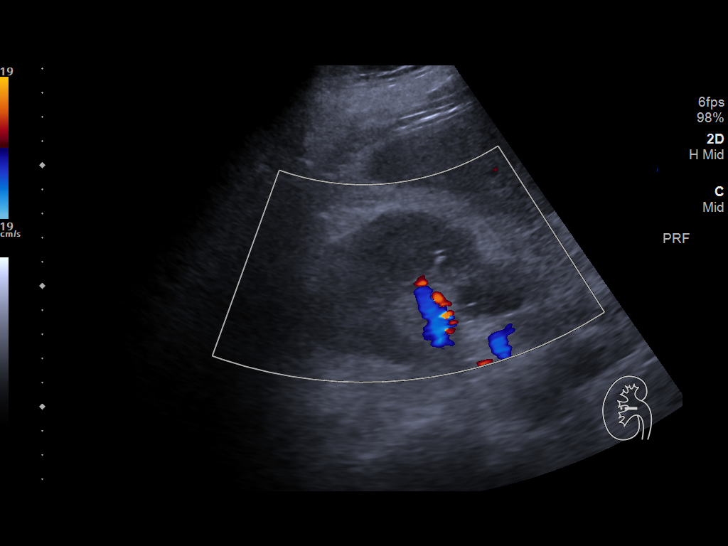
[im 12/31]
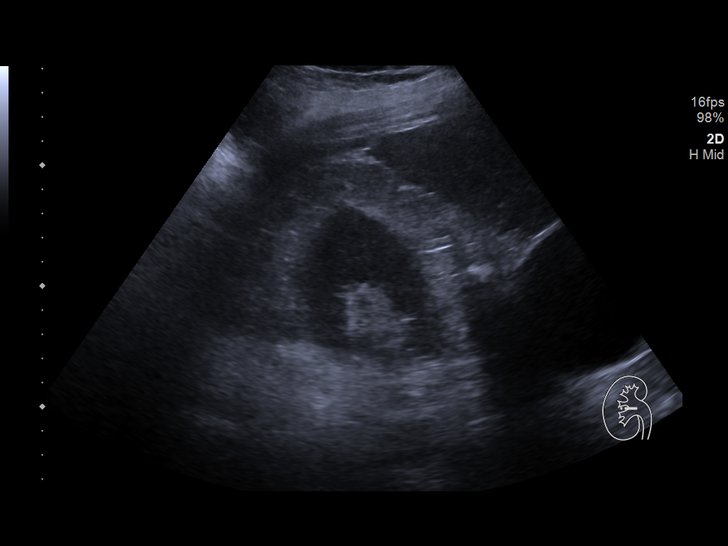
[im 14/31]
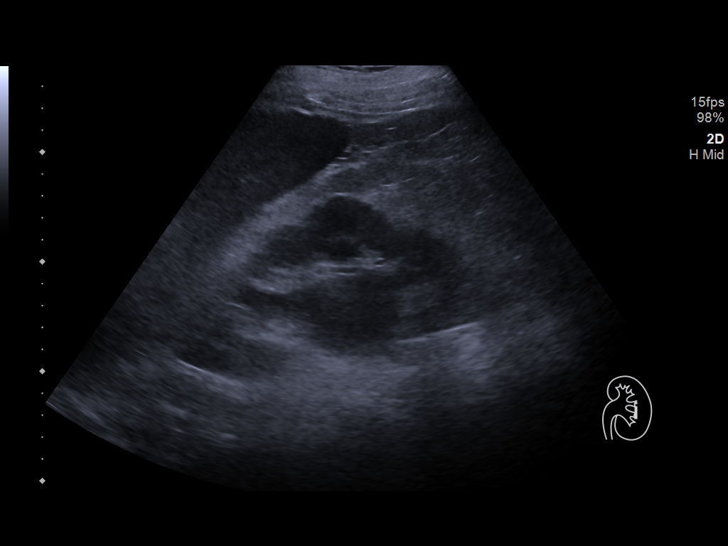
[im 17/31]
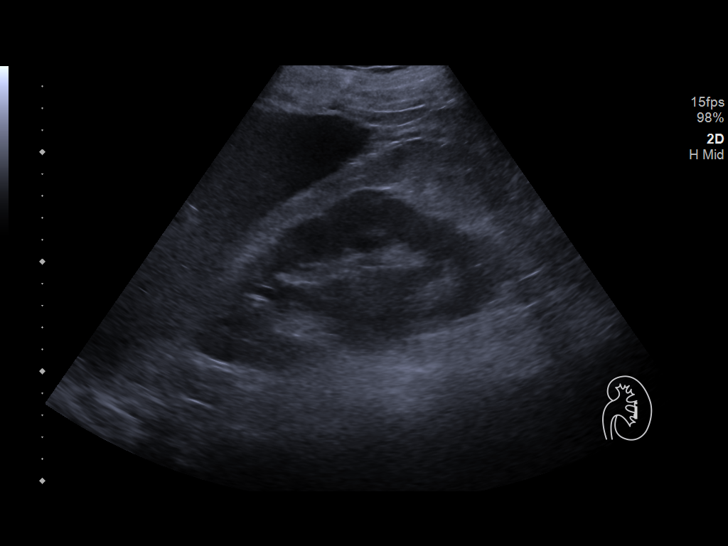
[im 19/31]
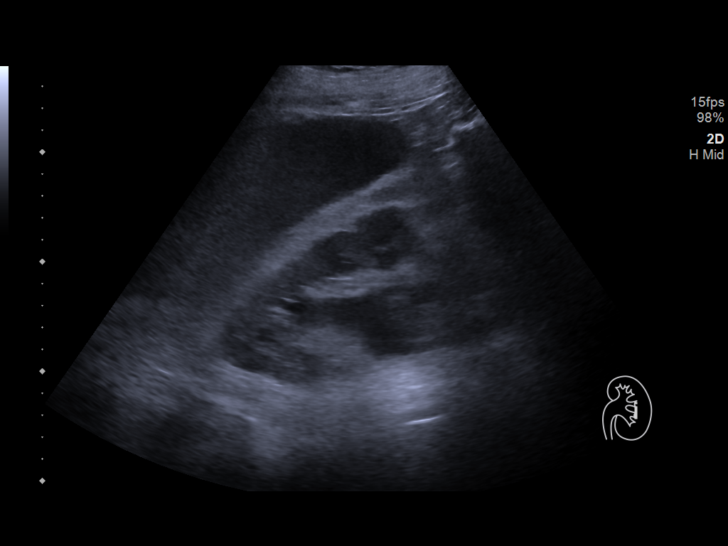
[im 21/31]
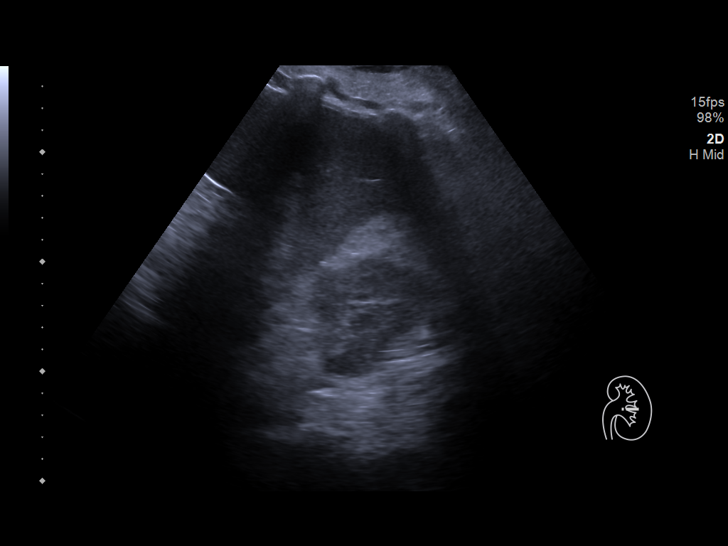
[im 23/31]
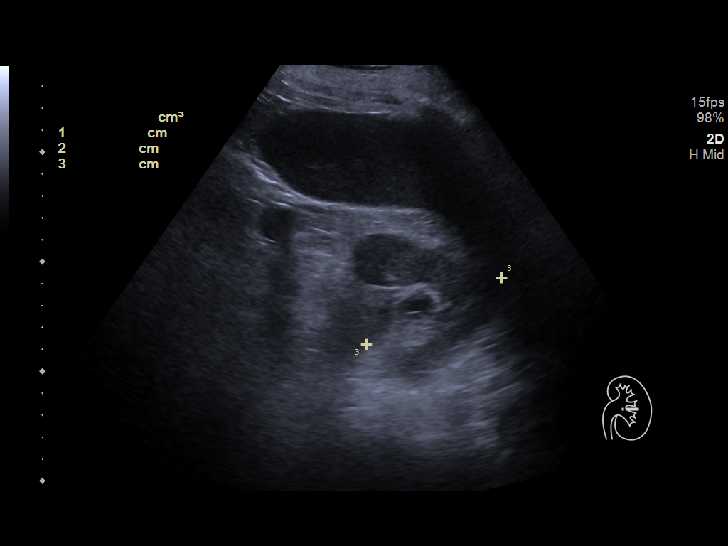
[im 26/31]
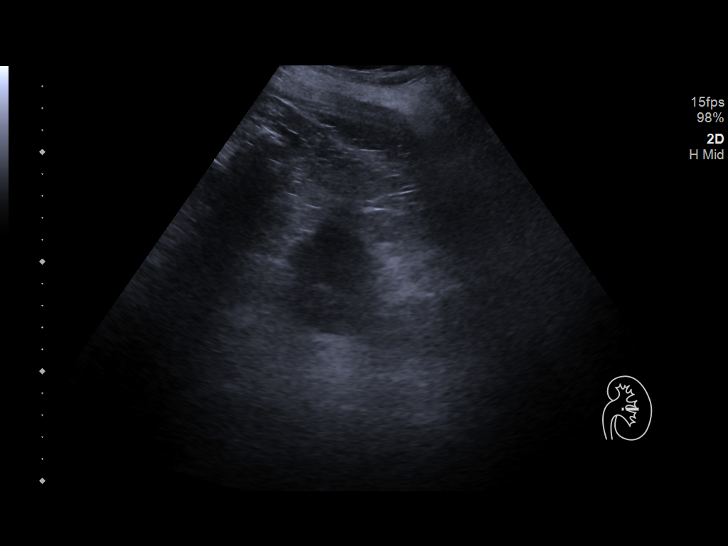
[im 28/31]
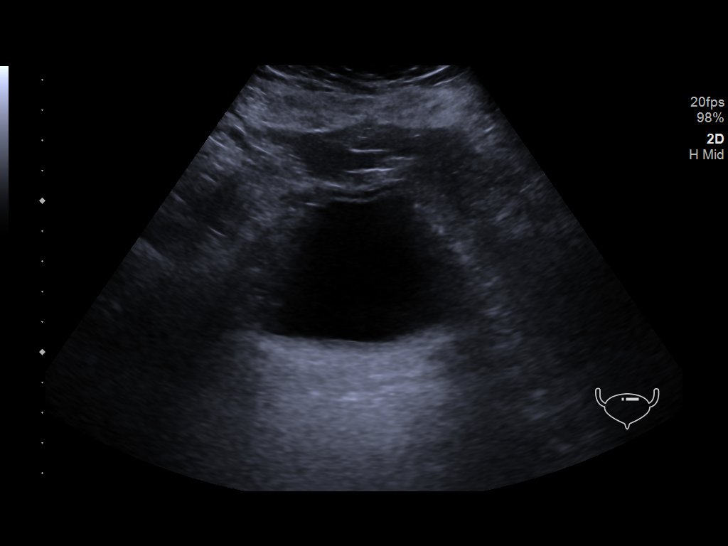
[im 31/31]
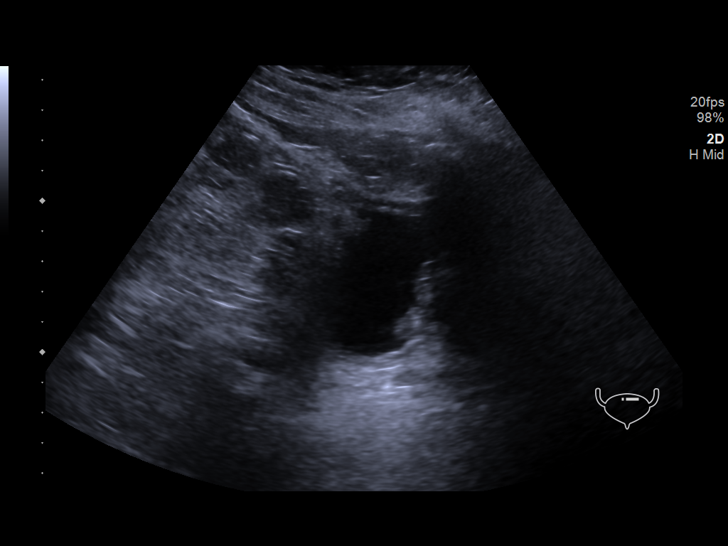

[14 of 25 positions shown; findings below may reference images not displayed]

FINDINGS: Right Kidney:

Renal measurements: 14.7 x 6.1 x 6.6 cm. = volume: 310 mL .
Echogenicity within normal limits. No mass or hydronephrosis
visualized.

Left Kidney:

Renal measurements: 15.0 x 7.2 x 6.9 cm = volume: 385 mL. Mild
hydronephrosis is noted with a prominent extrarenal pelvis.

Bladder:

Appears normal for degree of bladder distention.

Other:

None.
IMPRESSION: Mild left-sided hydronephrosis is noted.
